# Patient Record
Sex: Female | Born: 1940 | Race: White | Hispanic: No | Marital: Married | Smoking: Former smoker
Health system: Southern US, Community
[De-identification: ages and names within clinical notes are randomized; demographics above are authoritative.]

## PROBLEM LIST (undated history)

## (undated) DIAGNOSIS — Z9889 Other specified postprocedural states: Secondary | ICD-10-CM

## (undated) DIAGNOSIS — F410 Panic disorder [episodic paroxysmal anxiety] without agoraphobia: Secondary | ICD-10-CM

## (undated) DIAGNOSIS — R112 Nausea with vomiting, unspecified: Secondary | ICD-10-CM

## (undated) DIAGNOSIS — I499 Cardiac arrhythmia, unspecified: Secondary | ICD-10-CM

## (undated) DIAGNOSIS — S62109A Fracture of unspecified carpal bone, unspecified wrist, initial encounter for closed fracture: Secondary | ICD-10-CM

## (undated) DIAGNOSIS — I482 Chronic atrial fibrillation, unspecified: Secondary | ICD-10-CM

## (undated) DIAGNOSIS — J449 Chronic obstructive pulmonary disease, unspecified: Secondary | ICD-10-CM

## (undated) DIAGNOSIS — Z8489 Family history of other specified conditions: Secondary | ICD-10-CM

## (undated) DIAGNOSIS — Z87442 Personal history of urinary calculi: Secondary | ICD-10-CM

## (undated) DIAGNOSIS — F32A Depression, unspecified: Secondary | ICD-10-CM

## (undated) DIAGNOSIS — K279 Peptic ulcer, site unspecified, unspecified as acute or chronic, without hemorrhage or perforation: Secondary | ICD-10-CM

## (undated) DIAGNOSIS — I959 Hypotension, unspecified: Secondary | ICD-10-CM

## (undated) DIAGNOSIS — Z7901 Long term (current) use of anticoagulants: Secondary | ICD-10-CM

## (undated) DIAGNOSIS — C801 Malignant (primary) neoplasm, unspecified: Secondary | ICD-10-CM

## (undated) DIAGNOSIS — F329 Major depressive disorder, single episode, unspecified: Secondary | ICD-10-CM

## (undated) DIAGNOSIS — Z720 Tobacco use: Secondary | ICD-10-CM

## (undated) DIAGNOSIS — M47817 Spondylosis without myelopathy or radiculopathy, lumbosacral region: Secondary | ICD-10-CM

## (undated) HISTORY — DX: Major depressive disorder, single episode, unspecified: F32.9

## (undated) HISTORY — PX: ABDOMINAL HYSTERECTOMY: SHX81

## (undated) HISTORY — PX: SPLENECTOMY, TOTAL: SHX788

## (undated) HISTORY — PX: COLONOSCOPY: SHX174

## (undated) HISTORY — DX: Peptic ulcer, site unspecified, unspecified as acute or chronic, without hemorrhage or perforation: K27.9

## (undated) HISTORY — DX: Depression, unspecified: F32.A

## (undated) HISTORY — PX: APPENDECTOMY: SHX54

## (undated) HISTORY — DX: Fracture of unspecified carpal bone, unspecified wrist, initial encounter for closed fracture: S62.109A

## (undated) HISTORY — DX: Tobacco use: Z72.0

## (undated) HISTORY — DX: Spondylosis without myelopathy or radiculopathy, lumbosacral region: M47.817

## (undated) HISTORY — DX: Long term (current) use of anticoagulants: Z79.01

## (undated) HISTORY — DX: Chronic atrial fibrillation, unspecified: I48.20

## (undated) HISTORY — PX: TONSILLECTOMY: SUR1361

---

## 1982-01-27 HISTORY — PX: GASTROJEJUNOSTOMY: SHX1697

## 2000-10-29 ENCOUNTER — Emergency Department (HOSPITAL_COMMUNITY): Admission: EM | Admit: 2000-10-29 | Discharge: 2000-10-29 | Payer: Self-pay | Admitting: *Deleted

## 2000-10-29 ENCOUNTER — Encounter: Payer: Self-pay | Admitting: *Deleted

## 2001-02-24 ENCOUNTER — Ambulatory Visit (HOSPITAL_COMMUNITY): Admission: RE | Admit: 2001-02-24 | Discharge: 2001-02-24 | Payer: Self-pay | Admitting: Pediatrics

## 2001-02-24 ENCOUNTER — Encounter: Payer: Self-pay | Admitting: General Surgery

## 2002-01-27 DIAGNOSIS — I482 Chronic atrial fibrillation, unspecified: Secondary | ICD-10-CM

## 2002-01-27 DIAGNOSIS — Z7901 Long term (current) use of anticoagulants: Secondary | ICD-10-CM

## 2002-01-27 HISTORY — DX: Long term (current) use of anticoagulants: Z79.01

## 2002-01-27 HISTORY — PX: CHOLECYSTECTOMY: SHX55

## 2002-01-27 HISTORY — DX: Chronic atrial fibrillation, unspecified: I48.20

## 2002-04-21 ENCOUNTER — Inpatient Hospital Stay (HOSPITAL_COMMUNITY): Admission: EM | Admit: 2002-04-21 | Discharge: 2002-04-23 | Payer: Self-pay | Admitting: Emergency Medicine

## 2002-04-21 ENCOUNTER — Encounter: Payer: Self-pay | Admitting: Emergency Medicine

## 2002-04-22 ENCOUNTER — Encounter: Payer: Self-pay | Admitting: General Surgery

## 2002-06-01 ENCOUNTER — Encounter: Payer: Self-pay | Admitting: *Deleted

## 2002-06-01 ENCOUNTER — Inpatient Hospital Stay (HOSPITAL_COMMUNITY): Admission: AD | Admit: 2002-06-01 | Discharge: 2002-06-13 | Payer: Self-pay | Admitting: *Deleted

## 2002-06-04 ENCOUNTER — Encounter: Payer: Self-pay | Admitting: *Deleted

## 2002-06-07 ENCOUNTER — Encounter: Payer: Self-pay | Admitting: *Deleted

## 2002-06-10 ENCOUNTER — Encounter (INDEPENDENT_AMBULATORY_CARE_PROVIDER_SITE_OTHER): Payer: Self-pay | Admitting: Cardiology

## 2002-07-10 ENCOUNTER — Encounter: Payer: Self-pay | Admitting: Emergency Medicine

## 2002-07-10 ENCOUNTER — Emergency Department (HOSPITAL_COMMUNITY): Admission: EM | Admit: 2002-07-10 | Discharge: 2002-07-10 | Payer: Self-pay | Admitting: Emergency Medicine

## 2002-07-22 ENCOUNTER — Ambulatory Visit (HOSPITAL_COMMUNITY): Admission: RE | Admit: 2002-07-22 | Discharge: 2002-07-22 | Payer: Self-pay | Admitting: *Deleted

## 2002-08-19 ENCOUNTER — Encounter: Payer: Self-pay | Admitting: Emergency Medicine

## 2002-08-19 ENCOUNTER — Inpatient Hospital Stay (HOSPITAL_COMMUNITY): Admission: EM | Admit: 2002-08-19 | Discharge: 2002-08-24 | Payer: Self-pay | Admitting: Emergency Medicine

## 2002-08-22 ENCOUNTER — Encounter: Payer: Self-pay | Admitting: Family Medicine

## 2002-08-23 ENCOUNTER — Encounter: Payer: Self-pay | Admitting: Family Medicine

## 2002-08-27 ENCOUNTER — Encounter (INDEPENDENT_AMBULATORY_CARE_PROVIDER_SITE_OTHER): Payer: Self-pay | Admitting: Internal Medicine

## 2002-08-27 ENCOUNTER — Ambulatory Visit (HOSPITAL_COMMUNITY): Admission: RE | Admit: 2002-08-27 | Discharge: 2002-08-27 | Payer: Self-pay | Admitting: Internal Medicine

## 2003-02-24 ENCOUNTER — Ambulatory Visit (HOSPITAL_COMMUNITY): Admission: RE | Admit: 2003-02-24 | Discharge: 2003-02-24 | Payer: Self-pay | Admitting: Family Medicine

## 2004-01-24 ENCOUNTER — Ambulatory Visit: Payer: Self-pay | Admitting: *Deleted

## 2004-01-24 ENCOUNTER — Ambulatory Visit (HOSPITAL_COMMUNITY): Admission: RE | Admit: 2004-01-24 | Discharge: 2004-01-24 | Payer: Self-pay | Admitting: *Deleted

## 2004-02-12 ENCOUNTER — Ambulatory Visit: Payer: Self-pay | Admitting: *Deleted

## 2005-10-03 ENCOUNTER — Emergency Department (HOSPITAL_COMMUNITY): Admission: EM | Admit: 2005-10-03 | Discharge: 2005-10-03 | Payer: Self-pay | Admitting: Emergency Medicine

## 2006-01-01 ENCOUNTER — Ambulatory Visit: Payer: Self-pay | Admitting: Orthopedic Surgery

## 2006-05-19 ENCOUNTER — Ambulatory Visit: Payer: Self-pay | Admitting: Orthopedic Surgery

## 2006-06-09 ENCOUNTER — Ambulatory Visit: Payer: Self-pay | Admitting: Orthopedic Surgery

## 2006-07-02 ENCOUNTER — Ambulatory Visit: Payer: Self-pay | Admitting: Orthopedic Surgery

## 2006-09-01 ENCOUNTER — Ambulatory Visit (HOSPITAL_COMMUNITY): Admission: RE | Admit: 2006-09-01 | Discharge: 2006-09-01 | Payer: Self-pay | Admitting: Internal Medicine

## 2006-09-02 ENCOUNTER — Ambulatory Visit: Payer: Self-pay | Admitting: Cardiology

## 2006-09-02 ENCOUNTER — Ambulatory Visit (HOSPITAL_COMMUNITY): Admission: RE | Admit: 2006-09-02 | Discharge: 2006-09-02 | Payer: Self-pay | Admitting: Internal Medicine

## 2006-09-15 ENCOUNTER — Ambulatory Visit: Payer: Self-pay | Admitting: Cardiovascular Disease

## 2007-07-21 ENCOUNTER — Ambulatory Visit: Payer: Self-pay | Admitting: Orthopedic Surgery

## 2007-07-21 ENCOUNTER — Emergency Department (HOSPITAL_COMMUNITY): Admission: EM | Admit: 2007-07-21 | Discharge: 2007-07-21 | Payer: Self-pay | Admitting: Emergency Medicine

## 2007-07-28 ENCOUNTER — Ambulatory Visit: Payer: Self-pay | Admitting: Orthopedic Surgery

## 2007-08-05 ENCOUNTER — Ambulatory Visit: Payer: Self-pay | Admitting: Orthopedic Surgery

## 2007-08-12 ENCOUNTER — Ambulatory Visit: Payer: Self-pay | Admitting: Orthopedic Surgery

## 2007-09-13 ENCOUNTER — Ambulatory Visit: Payer: Self-pay | Admitting: Orthopedic Surgery

## 2007-10-14 ENCOUNTER — Ambulatory Visit: Payer: Self-pay | Admitting: Orthopedic Surgery

## 2008-01-11 ENCOUNTER — Encounter: Payer: Self-pay | Admitting: Orthopedic Surgery

## 2008-02-03 ENCOUNTER — Ambulatory Visit: Payer: Self-pay | Admitting: Orthopedic Surgery

## 2008-02-03 DIAGNOSIS — M4800 Spinal stenosis, site unspecified: Secondary | ICD-10-CM

## 2008-05-09 ENCOUNTER — Ambulatory Visit (HOSPITAL_COMMUNITY): Admission: RE | Admit: 2008-05-09 | Discharge: 2008-05-09 | Payer: Self-pay | Admitting: Internal Medicine

## 2008-07-04 ENCOUNTER — Encounter (INDEPENDENT_AMBULATORY_CARE_PROVIDER_SITE_OTHER): Payer: Self-pay | Admitting: *Deleted

## 2008-07-13 ENCOUNTER — Ambulatory Visit: Payer: Self-pay | Admitting: Orthopedic Surgery

## 2008-07-18 ENCOUNTER — Encounter (INDEPENDENT_AMBULATORY_CARE_PROVIDER_SITE_OTHER): Payer: Self-pay | Admitting: *Deleted

## 2008-07-20 ENCOUNTER — Ambulatory Visit (HOSPITAL_COMMUNITY): Admission: RE | Admit: 2008-07-20 | Discharge: 2008-07-20 | Payer: Self-pay | Admitting: Orthopedic Surgery

## 2008-07-26 ENCOUNTER — Ambulatory Visit: Payer: Self-pay | Admitting: Orthopedic Surgery

## 2009-01-25 ENCOUNTER — Ambulatory Visit (HOSPITAL_COMMUNITY): Admission: RE | Admit: 2009-01-25 | Discharge: 2009-01-25 | Payer: Self-pay | Admitting: Internal Medicine

## 2009-07-24 ENCOUNTER — Encounter (INDEPENDENT_AMBULATORY_CARE_PROVIDER_SITE_OTHER): Payer: Self-pay | Admitting: *Deleted

## 2009-07-24 LAB — CONVERTED CEMR LAB
Bilirubin, Direct: 0.1 mg/dL
Cholesterol: 187 mg/dL
Creatinine, Ser: 0.64 mg/dL
Glomerular Filtration Rate, Af Am: 106 mL/min/{1.73_m2}
Glucose, Bld: 94 mg/dL
Potassium: 4.5 meq/L
Total Protein: 7 g/dL
Triglycerides: 162 mg/dL

## 2010-02-16 ENCOUNTER — Encounter: Payer: Self-pay | Admitting: Internal Medicine

## 2010-02-17 ENCOUNTER — Encounter: Payer: Self-pay | Admitting: Internal Medicine

## 2010-02-20 ENCOUNTER — Ambulatory Visit
Admission: RE | Admit: 2010-02-20 | Discharge: 2010-02-20 | Payer: Self-pay | Source: Home / Self Care | Attending: Orthopedic Surgery | Admitting: Orthopedic Surgery

## 2010-02-20 DIAGNOSIS — M1610 Unilateral primary osteoarthritis, unspecified hip: Secondary | ICD-10-CM | POA: Insufficient documentation

## 2010-02-28 NOTE — Assessment & Plan Note (Signed)
Summary: LEFT HIP PAINRAD DOWN LEG/NEEDS XR/AARP/BSF   Visit Type:  Follow-up  CC:  left leg pain .  History of Present Illness:   70 year old female with pain in her LEFT leg for the last year.  She describes aching pain in the groin and anterior thigh radiating down the medial aspect of her leg in the L4 distribution. She also complains of LEFT gluteal pain, radiating down the back of her leg.  She is on Vicodin, and that's not helping.    Allergies: 1)  ! Penicillin 2)  ! * Hormonr Therapy 3)  ! Benadryl 4)  ! Codeine  Past History:  Past Medical History: Last updated: 07/21/2007 BLOOD TOO THICK DEPRESSION HEART - A FIB  Past Surgical History: Last updated: 07/21/2007 PANCREAS GALLBLADDER-GANGREEN  Review of Systems      See HPI Constitutional:  Denies weight loss, weight gain, fever, and chills. Neurologic:  Complains of unsteady gait. Musculoskeletal:  See HPI.  Physical Exam  Additional Exam:  GEN: well developed, well nourished, normal grooming and hygiene, no deformity and small body habitus.   CDV: LOWER EXTREMITIES: pulses are normal, no edema, no erythema. no tenderness  Lymph: LOWER EXTREMS: normal lymph nodes   Skin: LOWER EXTREMS: no rashes, skin lesions or open sores   NEURO: LOWER EXTREMS: - SLR'S normal coordination, reflexes, sensation.   Psyche: awake, alert and oriented. Mood normal   Gait: FAIRLY NORMAL   Inspection: bilateral hip examination shows that she has decreased range of motion painful range of motion with internal rotation of her LEFT hip. She has only about 15 of internal rotation.  Her motor exam is normal. She has excellent stability of the hip.  She is a little bit scoliotic. She has tenderness in the lower lumbar segments, 5 and S1.  She has fairly good range of motion able to touch. Her toes. She has some pain with that.     Impression & Recommendations:  Problem # 1:  DISC DISEASE, LUMBOSACRAL SPINE  (ICD-722.52) Assessment Comment Only  Orders: Est. Patient Level IV (84166)  Separate and Identifiable X-Ray report       Lumbosacral Spine ,2/3 views (72100) she has some scoliosis noted, and some mild facet joint changes consistent with some degenerative disc disease. Although displaces them.  Problem # 2:  ARTHRITIS, LEFT HIP (ICD-716.95) Assessment: Comment Only  Orders: Est. Patient Level IV (06301) Pelvis x-ray complete, minimum 3 views (60109) Separate and Identifiable X-Ray report      The LEFT hip is arthritic, with bone-on-bone changes in the superior lateral acetabulum. No deformity is same.  Impression osteoarthritis, LEFT hip.  Patient does have hip replacement, but she is not interested in that at this time. As an alternative we told her to use a cane in her RIGHT hand.  Problem # 3:  SCIATICA (ICD-724.3) Assessment: Comment Only  The following medications were removed from the medication list:    Darvocet-n 100 100-650 Mg Tabs (Propoxyphene n-apap) .[name]... 1 by mouth q 4 as needed pain  Patient Instructions: 1)  Please schedule a follow-up appointment as needed. 2)  Please use a cane in the right hand  3)  You have degeneartive disc disease in the back and Arthritis in the hip    Orders Added: 1)  Est. Patient Level IV [32355] 2)  Pelvis x-ray complete, minimum 3 views [72190] 3)  Lumbosacral Spine ,2/3 views [72100]  Appended Document: LEFT HIP PAINRAD DOWN LEG/NEEDS XR/AARP/BSF    June of  2010 she had an MRI lumbar spine, which showed mild LEFT, and borderline, RIGHT subarticular lateral recessed stenosis, L4-L5 due to bulging disc and facet arthritis. There is also borderline central stenosis at the same level

## 2010-03-18 ENCOUNTER — Encounter (INDEPENDENT_AMBULATORY_CARE_PROVIDER_SITE_OTHER): Payer: Medicare Other

## 2010-03-18 ENCOUNTER — Encounter: Payer: Self-pay | Admitting: Cardiovascular Disease

## 2010-03-18 DIAGNOSIS — Z7901 Long term (current) use of anticoagulants: Secondary | ICD-10-CM

## 2010-03-18 DIAGNOSIS — I4891 Unspecified atrial fibrillation: Secondary | ICD-10-CM

## 2010-03-18 LAB — CONVERTED CEMR LAB: POC INR: 2

## 2010-03-26 ENCOUNTER — Encounter (INDEPENDENT_AMBULATORY_CARE_PROVIDER_SITE_OTHER): Payer: Self-pay | Admitting: *Deleted

## 2010-03-26 ENCOUNTER — Encounter: Payer: Self-pay | Admitting: Cardiology

## 2010-03-26 ENCOUNTER — Ambulatory Visit (INDEPENDENT_AMBULATORY_CARE_PROVIDER_SITE_OTHER): Payer: Medicare Other | Admitting: Cardiology

## 2010-03-26 DIAGNOSIS — I4891 Unspecified atrial fibrillation: Secondary | ICD-10-CM

## 2010-03-26 NOTE — Medication Information (Signed)
Summary: ***PROTIME/TG  Anticoagulant Therapy  Managed by: Vashti Hey, RN Supervising MD: Clifton James MD, Cristal Deer Indication 1: Atrial Fibrillation Lab Used: [facility] [place] Site: [place] INR POC 2.0  Dietary changes: no    Health status changes: no    Bleeding/hemorrhagic complications: no    Recent/future hospitalizations: no    Any changes in medication regimen? no    Recent/future dental: no  Any missed doses?: no       Is patient compliant with meds? yes      Comments: Referred from Dr Felecia Shelling for coumadin management  Allergies: 1)  ! Penicillin 2)  ! * Hormonr Therapy 3)  ! Benadryl 4)  ! Codeine  Anticoagulation Management History:      The patient is taking warfarin and comes in today for a routine follow up visit.  Positive risk factors for bleeding include an age of 70 years or older.  The bleeding index is 'intermediate risk'.  Negative CHADS2 values include Age > 70 years old.  Anticoagulation responsible provider: Clifton James MD, Cristal Deer.  INR POC: 2.0.  Cuvette Lot#: [id number].    Anticoagulation Management Assessment/Plan:      The patient's current anticoagulation dose is Coumadin 6 mg tabs: [name]  The next INR is due 04/03/2010.  Anticoagulation instructions were given to patient.  Results were reviewed/authorized by Vashti Hey, RN.  She was notified by Vashti Hey RN.        Coagulation management information includes: Refered from Dr Felecia Shelling  02/05/10 INR 3.6 Coumadin decrease to 5mg  alternating 4mg    Pt has 5mg  and 4mg  tablets.  Current Anticoagulation Instructions: INR 2.0 Take coumadin 5mg  once daily except 2.5mg  on Wednesdays

## 2010-03-27 ENCOUNTER — Encounter: Payer: Self-pay | Admitting: Cardiology

## 2010-03-27 LAB — CONVERTED CEMR LAB
ALT: 13 units/L (ref 0–35)
AST: 18 units/L (ref 0–37)
Albumin: 4.1 g/dL (ref 3.5–5.2)
Basophils Absolute: 0.1 10*3/uL (ref 0.0–0.1)
Basophils Relative: 1 % (ref 0–1)
CO2: 29 meq/L (ref 19–32)
Calcium: 9.1 mg/dL (ref 8.4–10.5)
Chloride: 104 meq/L (ref 96–112)
HCT: 41.4 % (ref 36.0–46.0)
Hemoglobin: 13.6 g/dL (ref 12.0–15.0)
INR: 1.95 — ABNORMAL HIGH (ref <1.50)
Lymphs Abs: 5.4 10*3/uL — ABNORMAL HIGH (ref 0.7–4.0)
MCV: 90.4 fL (ref 78.0–100.0)
Monocytes Relative: 8 % (ref 3–12)
Platelets: 329 10*3/uL (ref 150–400)
Potassium: 5.2 meq/L (ref 3.5–5.3)
Prothrombin Time: 22.4 s — ABNORMAL HIGH (ref 11.6–15.2)
RBC: 4.58 M/uL (ref 3.87–5.11)
Sodium: 143 meq/L (ref 135–145)
Total CHOL/HDL Ratio: 3.5
Total Protein: 6.8 g/dL (ref 6.0–8.3)
WBC: 12.9 10*3/uL — ABNORMAL HIGH (ref 4.0–10.5)

## 2010-03-28 ENCOUNTER — Encounter (INDEPENDENT_AMBULATORY_CARE_PROVIDER_SITE_OTHER): Payer: Medicare Other

## 2010-03-28 DIAGNOSIS — R195 Other fecal abnormalities: Secondary | ICD-10-CM

## 2010-04-02 ENCOUNTER — Encounter: Payer: Self-pay | Admitting: Cardiology

## 2010-04-03 ENCOUNTER — Encounter: Payer: Self-pay | Admitting: Cardiovascular Disease

## 2010-04-03 ENCOUNTER — Encounter (INDEPENDENT_AMBULATORY_CARE_PROVIDER_SITE_OTHER): Payer: Medicare Other

## 2010-04-03 DIAGNOSIS — I4891 Unspecified atrial fibrillation: Secondary | ICD-10-CM

## 2010-04-03 DIAGNOSIS — Z7901 Long term (current) use of anticoagulants: Secondary | ICD-10-CM

## 2010-04-03 LAB — CONVERTED CEMR LAB: POC INR: 2.3

## 2010-04-04 NOTE — Medication Information (Signed)
Summary: [facility]  Anticoagulant Therapy  Managed by: Vashti Hey, RN Supervising MD: Dietrich Pates MD, Molly Maduro Indication 1: Atrial Fibrillation Lab Used: [facility] [place] Site: [place] PT 22.4  Dietary changes: no    Health status changes: no    Bleeding/hemorrhagic complications: no    Recent/future hospitalizations: no    Any changes in medication regimen? no    Recent/future dental: no  Any missed doses?: no       Is patient compliant with meds? yes       Allergies: 1)  ! Penicillin 2)  ! * Hormonr Therapy 3)  ! Benadryl 4)  ! Codeine  Anticoagulation Management History:      Her anticoagulation is being managed by telephone today.  Positive risk factors for bleeding include an age of 70 years or older.  The bleeding index is 'intermediate risk'.  Negative CHADS2 values include Age > 47 years old.  Her last INR was 1.95 and today's INR is 1.95.  Prothrombin time is 22.4.  Anticoagulation responsible provider: Dietrich Pates MD, Molly Maduro.    Anticoagulation Management Assessment/Plan:      The patient's current anticoagulation dose is Coumadin 6 mg tabs: [name]  The target INR is 2.0-3.0.  The next INR is due 04/03/2010.  Anticoagulation instructions were given to patient.  Results were reviewed/authorized by Vashti Hey, RN.  She was notified by Vashti Hey RN.         Prior Anticoagulation Instructions: INR 2.0 Take coumadin 5mg  once daily except 2.5mg  on Wednesdays  Current Anticoagulation Instructions: INR 1.95 Pt states she has been taking coumadin to 5mg  once daily  Told pt to take 7.5mg  tonight then resume 5mg  once daily until INR check on 04/03/10

## 2010-04-04 NOTE — Miscellaneous (Signed)
Summary: labs lipid,bmp,a1c,heptic function,07/24/2009  Clinical Lists Changes  Observations: Added new observation of CALCIUM: 8.9 mg/dL (57/84/6962 9:52) Added new observation of ALBUMIN: 4.2 g/dL (84/13/2440 1:02) Added new observation of PROTEIN, TOT: 7.0 g/dL (72/53/6644 0:34) Added new observation of SGPT (ALT): 13 units/L (07/24/2009 8:08) Added new observation of SGOT (AST): 16 units/L (07/24/2009 8:08) Added new observation of ALK PHOS: 69 units/L (07/24/2009 8:08) Added new observation of BILI DIRECT: 0.1 mg/dL (74/25/9563 8:75) Added new observation of GFR AA: 106 mL/min/1.22m2 (07/24/2009 8:08) Added new observation of GFR: 92 mL/min (07/24/2009 8:08) Added new observation of CREATININE: 0.64 mg/dL (64/33/2951 8:84) Added new observation of BG RANDOM: 94 mg/dL (16/60/6301 6:01) Added new observation of CO2 PLSM/SER: 26 meq/L (07/24/2009 8:08) Added new observation of CL SERUM: 104 meq/L (07/24/2009 8:08) Added new observation of K SERUM: 4.5 meq/L (07/24/2009 8:08) Added new observation of NA: 139 meq/L (07/24/2009 8:08) Added new observation of LDL: 111 mg/dL (09/32/3557 3:22) Added new observation of HDL: 44 mg/dL (02/54/2706 2:37) Added new observation of TRIGLYC TOT: 162 mg/dL (62/83/1517 6:16) Added new observation of CHOLESTEROL: 187 mg/dL (07/37/1062 6:94) Added new observation of HGBA1C: 6.2 % (07/24/2009 8:08)

## 2010-04-09 NOTE — Medication Information (Signed)
Summary: [facility]  Anticoagulant Therapy  Managed by: Vashti Hey, RN Supervising MD: Eden Emms MD, Theron Arista Indication 1: Atrial Fibrillation Lab Used: [facility] [place] Site:  INR POC 2.3  Dietary changes: no    Health status changes: no    Bleeding/hemorrhagic complications: no    Recent/future hospitalizations: no    Any changes in medication regimen? no    Recent/future dental: no  Any missed doses?: no       Is patient compliant with meds? yes       Allergies: 1)  ! Penicillin 2)  ! * Hormonr Therapy 3)  ! Benadryl 4)  ! Codeine  Anticoagulation Management History:      The patient is taking warfarin and comes in today for a routine follow up visit.  Positive risk factors for bleeding include an age of 70 years or older.  The bleeding index is 'intermediate risk'.  Negative CHADS2 values include Age > 41 years old.  Her last INR was 1.95.  Anticoagulation responsible provider: Eden Emms MD, Theron Arista.  INR POC: 2.3.    Anticoagulation Management Assessment/Plan:      The patient's current anticoagulation dose is Coumadin 6 mg tabs: [name]  The target INR is 2.0-3.0.  The next INR is due 04/24/2010.  Anticoagulation instructions were given to patient.  Results were reviewed/authorized by Vashti Hey, RN.  She was notified by Vashti Hey RN.         Prior Anticoagulation Instructions: INR 1.95 Pt states she has been taking coumadin to 5mg  once daily  Told pt to take 7.5mg  tonight then resume 5mg  once daily until INR check on 04/03/10  Current Anticoagulation Instructions: INR 2.3 Continue coumadin 5mg  once daily

## 2010-04-16 NOTE — Assessment & Plan Note (Signed)
Summary: [facility]   Visit Type:  Initial Consult Primary Provider:  Dr. Avon Gully   History of Present Illness: [name] is seen at the kind request of Dr. Avon Gully for evaluation of atrial fibrillation and management of anticoagulation.  She is a very pleasant jocular woman previously seen by Dr. Domingo Sep of [facility], Dr. Dorethea Clan and Dr. Eden Emms.  During a 3+ year hiatus between evaluations by a cardiologist, she has done quite well.  She denies significant cardiopulmonary symptoms and has good exercise tolerance.  Anticoagulation has been managed by her primary care physician.  She has had no complications related to treatment with warfarin.  Carotid Doppler  Procedure date:  09/02/2006  Findings:       IMPRESSION:   Early bilateral bifurcation and proximal left ICA plaque without   hemodynamically significant stenosis.  Continued surveillance   recommended.   Deanne Coffer, D. Reuel Boom,  M.D.  CT Scan  Procedure date:  06/04/2002  Findings:      CT of Chest   IMPRESSION:  1.  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  ANOTHER MORE LINEAR NODULAR DENSITY IN THE RIGHT UPPER LOBE.    2.  NO ADENOPATHY.  3.  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  4.  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   Cyndie Chime, M.D.  EKG  Procedure date:  Apr 06, 2010  Findings:      Atrial fibrillation Controlled ventricular response with heart rate of 70 bpm Right ventricular conduction delay ST-T wave abnormality consistent with digoxin effect Comparison with prior tracing of 01/24/04: No significant change   Current Medications (verified): 1)  Neurontin 100 Mg Caps (Gabapentin) .... 2 At Bedtime 2)  Metoprolol Tartrate 25 Mg Tabs (Metoprolol Tartrate) .... Take 1/2 Tab Two Times A Day 3)   Lanoxin 0.125 Mg Tabs (Digoxin) .... Take 1 Tab Everyother Day 4)  Coumadin 5 Mg Tabs (Warfarin Sodium) .... Take 1 Tab Daily 5)  Alprazolam 0.5 Mg Tabs (Alprazolam) .... Take 1 Tab Three Times A Day 6)  Celexa 20 Mg Tabs (Citalopram Hydrobromide) .... Take 1 Tab Daily 7)  Diltiazem Hcl 120 Mg Tabs (Diltiazem Hcl) .... Take 1 Tab Daily 8)  Lanoxin 0.25 Mg Tabs (Digoxin) .... Take 1 Tab Every Otherday 9)  Tylenol Extra Strength 500 Mg Tabs (Acetaminophen) .... Take As Needed  Allergies: 1)  ! Penicillin 2)  ! * Estrogen 3)  ! Benadryl 4)  ! Codeine 5)  ! * Transdermal Nicotine  Comments:  Nurse/Medical Assistant: patient brought meds she uses [facility]  Past History:  Family History: Last updated: 04/06/2010 Father deceased at age 46 due to neoplastic disease Mother suffered a fatal myocardial infarction at age 18 Siblings, 3 sisters have died, 2 with carcinoma of the breast and one with cirrhosis; brother deceased as a result of myocardial infarction and a second as a result of gastric carcinoma;3 additional sisters and 3 brothers  Social History: Last updated: Apr 06, 2010 Employment-housecleaning Marital-married with one child and one adopted child; one stillbirth and one deceased child at age 17 due to illness Tobacco Use - 40 pack years; one half pack per day Alcohol Use - no Regular Exercise - no Drug Use - no  Past Medical History: Chronic atrial fibrillation-2004; near syncope and 2008->  Medication adjusted Anticoagulation Peptic ulcer  disease Tobacco abuse-40 pack years; quit attempt in 2012 Depression Degenerative joint disease-lumbosacral spine and hips  Past Surgical History: Cholecystectomy-2004 [place] II-1984 with splenectomy and incidental appendectomy Hysterectomy-no oophorectomy C-Section Tonsillectomy Colonoscopy-mid 1980s  Family History: Father deceased at age 23 due to neoplastic disease Mother suffered a fatal myocardial  infarction at age 88 Siblings, 3 sisters have died, 2 with carcinoma of the breast and one with cirrhosis; brother deceased as a result of myocardial infarction and a second as a result of gastric carcinoma;3 additional sisters and 3 brothers  Social History: Employment-housecleaning Marital-married with one child and one adopted child; one stillbirth and one deceased child at age 75 due to illness Tobacco Use - 40 pack years; one half pack per day Alcohol Use - no Regular Exercise - no Drug Use - no  Review of Systems       Requires corrective lenses; upper and lower dentures; history of gastric ulcer in the 1980s; arthritic discomfort in the hips.  All other systems reviewed and are negative.  Vital Signs:  Patient profile:   70 year old female Height:      60 inches Weight:      118 pounds BMI:     23.13 Pulse rate:   81 / minute BP sitting:   131 / 82  (left arm)  Vitals Entered By: Dreama Saa, CNA (March 26, 2010 1:49 PM)  Physical Exam  General:  Proportionate height and weight; well developed; no acute distress: HEENT-hoarse voice   Neck-No JVD; no carotid bruits: Lungs-No tachypnea, no rales; no rhonchi; no wheezes: Cardiovascular-normal PMI; normal S1 and S2; irregular rhythm Abdomen-BS normal; soft and non-tender without masses or organomegaly:  Musculoskeletal-No deformities, no cyanosis or clubbing: Neurologic-Normal cranial nerves; symmetric strength and tone:  Skin-Warm, no significant lesions: Extremities-Nl distal pulses; no edema:     Impression & Recommendations:  Problem # 1:  ATRIAL FIBRILLATION (ICD-427.31) Long-standing arrhythmia without symptoms or apparent adverse clinical effects.  Heart rate control is good with current medication, which will be continued.  Problem # 2:  ANTICOAGULATION (ICD-V58.61) Patient has done well with warfarin treatment.  Cardiovascular risk factors have been marginal to warrant full anticoagulation, but now,as  a woman approaching age 59, risk for thromboembolism is substantial.  Full anticoagulation will be maintained.  Problem # 3:  TOBACCO ABUSE (ICD-305.1) Patient has substantially decreased tobacco use after being provided with a new car for doing so.  Unfortunately, she continues to smoke occasionally, but not every day.  Complete abstinence is advised.  Other Orders: Hemoccult Cards (Take Home) (Hemoccult Cards) T-Lipid Profile (09811-91478) T-Comprehensive Metabolic Panel [phone]) T-CBC w/Diff ([id number]) T-Protime, Auto ([id number])  Patient Instructions: 1)  Your physician recommends that you schedule a follow-up appointment in: 1 year 2)  Your physician recommends that you return for lab work in: today 3)  Your physician discussed the hazards of tobacco use.  Tobacco use cessation is recommended and techniques and options to help you quit were discussed.

## 2010-04-23 ENCOUNTER — Encounter: Payer: Self-pay | Admitting: Cardiology

## 2010-04-23 DIAGNOSIS — Z7901 Long term (current) use of anticoagulants: Secondary | ICD-10-CM

## 2010-04-23 DIAGNOSIS — I4891 Unspecified atrial fibrillation: Secondary | ICD-10-CM

## 2010-04-24 ENCOUNTER — Ambulatory Visit (INDEPENDENT_AMBULATORY_CARE_PROVIDER_SITE_OTHER): Payer: Medicare Other | Admitting: *Deleted

## 2010-04-24 DIAGNOSIS — I4891 Unspecified atrial fibrillation: Secondary | ICD-10-CM

## 2010-04-24 DIAGNOSIS — Z7901 Long term (current) use of anticoagulants: Secondary | ICD-10-CM

## 2010-04-24 LAB — POCT INR: INR: 2.5

## 2010-05-22 ENCOUNTER — Ambulatory Visit (INDEPENDENT_AMBULATORY_CARE_PROVIDER_SITE_OTHER): Payer: Medicare Other | Admitting: *Deleted

## 2010-05-22 DIAGNOSIS — I4891 Unspecified atrial fibrillation: Secondary | ICD-10-CM

## 2010-05-22 DIAGNOSIS — Z7901 Long term (current) use of anticoagulants: Secondary | ICD-10-CM

## 2010-05-22 LAB — POCT INR: INR: 3.7

## 2010-05-27 ENCOUNTER — Telehealth: Payer: Self-pay | Admitting: Cardiology

## 2010-05-27 ENCOUNTER — Other Ambulatory Visit: Payer: Self-pay | Admitting: *Deleted

## 2010-05-27 MED ORDER — WARFARIN SODIUM 5 MG PO TABS: ORAL_TABLET | ORAL | Status: DC

## 2010-05-27 NOTE — Telephone Encounter (Signed)
Pt needs a refill called in for coumadin to [facility]

## 2010-05-28 ENCOUNTER — Other Ambulatory Visit: Payer: Self-pay | Admitting: Cardiology

## 2010-05-28 MED ORDER — WARFARIN SODIUM 5 MG PO TABS: ORAL_TABLET | ORAL | Status: DC

## 2010-05-28 NOTE — Telephone Encounter (Signed)
Pt needs rx called in for coumadin she took last pill today and was told by Dr Felecia Shelling office they were not going to fill rx any longer.  Pt gets rx from [facility]

## 2010-06-10 ENCOUNTER — Ambulatory Visit (INDEPENDENT_AMBULATORY_CARE_PROVIDER_SITE_OTHER): Payer: Medicare Other | Admitting: *Deleted

## 2010-06-10 DIAGNOSIS — Z7901 Long term (current) use of anticoagulants: Secondary | ICD-10-CM

## 2010-06-10 DIAGNOSIS — I4891 Unspecified atrial fibrillation: Secondary | ICD-10-CM

## 2010-06-10 LAB — POCT INR: INR: 2.3

## 2010-06-11 NOTE — Procedures (Signed)
NAMEHURLEY, BLEVINS NO.:  [id number]   MEDICAL RECORD NO.:  [id number]          PATIENT TYPE:  OUT   LOCATION:  RAD                           FACILITY:  [facility]   PHYSICIAN:  Madolyn Frieze. Jens Som, MD, FACCDATE OF BIRTH:  27-Apr-1940   DATE OF PROCEDURE:  DATE OF DISCHARGE:                                ECHOCARDIOGRAM   HISTORY OF PRESENT ILLNESS:  The patient is a 70 year old female who  apparently had a syncopal episode.  Study is performed to quantify left  ventricular function.  It is technically adequate.  The two-dimensional  measurements are as follows:  The left ventricle end-diastolic dimension  is 3.9 cm and the left ventricle end-systolic dimension is 3.2 cm.  The  aorta is 3.1 cm.  Left atrium is 3.6 cm.  The septum and poster wall are  1.1 cm, respectively.   The overall left ventricular function is normal with an estimated  ejection fraction of 55%.  Note, the ejection fraction was somewhat  difficult to quantitate as the patient was in atrial fibrillation at the  time.  The heart rate was in the 100 range.  There appear to be mild  left atrial enlargement as well as mild right atrial enlargement.  The  right ventricle was normal in size and function.  There was no  pericardial effusion.   The aortic valve was trileaflet and there was no aortic stenosis or  aortic insufficiency.  The mitral valve was normal and there was trace  mitral regurgitation.  There was trace tricuspid regurgitation.  There  was no pulmonic insufficiency.   NOTE:  There was also lipomatous hypertrophy of the atrial septum.   FINAL INTERPRETATION:  1. Normal left ventricular function - Note, the patient was in atrial      fibrillation during this study with a heart rate of approximately      100 and ejection fraction was somewhat difficult to quantitate.  2. Mild biatrial enlargement.  3. Lipomatous hypertrophy of the atrial septum.  4. Trace mitral and tricuspid  regurgitation.      Madolyn Frieze Jens Som, MD, [facility]  Electronically Signed     BSC/MEDQ  D:  09/02/2006  T:  09/02/2006  Job:  [id number]   cc:   [name] D. Felecia Shelling, MD  Fax: [phone]

## 2010-06-11 NOTE — Assessment & Plan Note (Signed)
[facility]                       [facility] OFFICE NOTE   Marissa, Turner                       MRN:          [id number]  DATE:09/15/2006                            DOB:          May 10, 1940    [name] is a pleasant 70 year old patient referred by Dr.  Felecia Shelling for  syncope and atrial fibrillation.   The patient has been previously been seen by Dr.  Anthonette Legato back in 2006.  She has not had close followup since that time. Her atrial fibrillation  goes back as far as 2004 when she had a cholecystectomy for a gangrenous  cholecystitis. Apparently at that time, she was seen by [facility] and  she had clot in her left atrium by TEE and a cardioversion was not  recommended. She has been rate controlled with anticoagulation since  then. She has not had any significant TIAs or CVAs. She takes 5 mg of  Coumadin a day and she says her INRs have been perfect.   She had been having increasing fatigue and dizziness over the last two  months. She describes some postural symptoms. She did not have  particular palpitations. There was no frank syncope. The patient had her  Cardizem and metoprolol cut in half recently and she feels 100%  improved. She relates her symptoms of lightheadedness, pre-syncope and  weakness to being over medicated. The fact that she has marked  improvement with half the dose of metoprolol and Cardizem would lend  credence to this.   There is no documented history of coronary artery disease. Her risk  factors include hypertension and smoking.   She has not had a recent stress test or echocardiogram.   The patient had a recent carotid duplex done August 6th because of her  weakness and her syncope. It was normal without evidence of high grade  stenosis. Her symptoms have not recurred since her medications were  adjusted down.   In regards to her atrial fibrillation, there has been no documented  history of excessively slow  rates and no indication for pacemakers. She  in general is in chronic atrial fibrillation and is not going in and  out.   REVIEW OF SYSTEMS:  Are remarkable for an occasional cough. She  continues to smoke. She says as little as 3-4 cigarettes a day.   PAST MEDICAL HISTORY:  1. Atrial fibrillation.  2. 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  3. Peptic ulcer disease with Bilroth II.  4. Previous cholecystectomy.  5. She has also had a previous C-section.   ALLERGIES:  PENICILLIN AND FEMALE HORMONES WHICH MAKE HER SWELL.   She is a greater than 50 pack year smoker.   FAMILY HISTORY:  Is noncontributory.   The patient cleans houses for a living. She has one child and one  adopted child in a sense that was with her today. She is happily  married. She likes to fish and in general is fairly active.   She does not drink excessively.   CURRENT MEDICATIONS:  1. Lopressor 12.5 b.i.d.  2. Cardizem 160 once a day.  3. Restoril  30 at night.  4. Dig 0.125 a day.  5. Lexapro 20 a day.   PHYSICAL EXAMINATION:  Is remarkable for a chronically ill appearing  middle aged female in no distress. She looks a little older than her  stated age. Affect is appropriate. Weight is 122. Blood pressure is  120/62, pulse is 72 and irregular. Respiratory rate is 14. She is  afebrile.  NECK: Supple. There are no bruits.  There is no thyromegaly. No  lymphadenopathy. No JVP elevation.  HEENT: Is unremarkable.  LUNGS:  Are clear with good diaphragmatic motion. No wheezing.  There is an S1, S2 with normal heart sounds. PMI is normal.  ABDOMEN: Is protuberant. She has multiple scars from her hysterectomy  and gallbladder as well as colectomy surgery. Bowel sounds are positive  and no AAA. No tenderness. No hepatosplenomegaly. No hepatojugular  reflux.  Femorals are +3 bilaterally without bruits. Distal pulses are intact. No  edema.  NEURO: Is nonfocal. There is no muscular  weakness.   EKG shows atrial fibrillation with nonspecific ST-T wave changes and dig  effect.   IMPRESSION:  1. Chronic atrial fibrillation. No evidence of long pauses. Doubt it      is related to her dizziness or syncope. Continue anticoagulation.  2. Pre-syncope related to probable over medication, marked improvement      in symptoms with lower dose Cardizem and metoprolol. I suspect that      she could be controlled with just one of these medications if Dr.      Felecia Shelling wanted to simplify her regimen, I would give her 240 of      Cardizem only or Toprol 25-50 mg only. I will leave this up to him      to simplify her regimen as opposed to being on two drugs.  3. Hypertension. Currently well-controlled. Continue low dose beta-      blocker or calcium channel blocker and low salt diet.  4. Anticoagulation. Anticoagulation being followed at Dr.  Letitia Neri      office. No bleeding diaphysis. Continue current schedule.  5. Smoking with chronic obstructive pulmonary disease. The patient is      really not motivated to quit. She will probably need followup PFTs      in the future. She would be a candidate for Wellbutrin and Chantix      should she decide to do this in the future.  6. History of colitis with Bilroth II. May need further GI followup in      the future. No evidence of bleeding in her stools. Followup routine      hematocrit and hemoglobin on a yearly basis.   In regards to the patient's heart, I think given her chronic atrial  fibrillation and history of pre-syncope, we should do a 2D  echocardiogram. It would be nice to document normal RV and LV function  and to further assess her atrial sizes which are probably moderately  dilated. As long as her echo has no surprises, I will see her back in a  year.     [name]. Eden Emms, MD, [facility]  Electronically Signed    PCN/MedQ  DD: 09/15/2006  DT: 09/15/2006  Job #: [id number]

## 2010-06-14 NOTE — Cardiovascular Report (Signed)
   NAME:  Marissa Turner, Marissa Turner                          ACCOUNT NO.:  [id number]   MEDICAL RECORD NO.:  [id number]                   PATIENT TYPE:  AMB   LOCATION:  ENDO                                 FACILITY:   PHYSICIAN:  Kem Boroughs, M.D.                 DATE OF BIRTH:  1940-12-05   DATE OF PROCEDURE:  07/22/2002  DATE OF DISCHARGE:                              CARDIAC CATHETERIZATION   PROCEDURE:  Transesophageal echocardiogram with possible cardioversion.   INDICATION:  Atrial fibrillation.   PERFORMING PHYSICIAN:  Kem Boroughs, M.D.   COMPLICATIONS:  None immediate.   The patient was sedated with 12 mg of IV Versed and 100 mcg of IV fentanyl.  (As you can see, she required a significant amount of medication for  adequate sedation). The patient was intubated without difficulty.   RESULTS:  1. Left atrial appendage with continued possible clot as previously.     Decreased exit velocities are noted and are approximately 20 cm per     second.  She does have significant smoke in her atrial appendage.  2. The left atrium is mildly dilated and also has significant smoke.  No     clot is noted.  3. Right atrium and right ventricle are normal in size and function.  4. Mitral valve is normal in structure and function with mild mitral     regurgitation.  5. Aortic valve is thin, tall leaflet, and pliable.  6. Pulmonic valve was not well visualized.  7. Tricuspid valve is normal in structure and function.  8. The left ventricle is normal in size with normal left ventricular     systolic function.  9. The aorta is without significant atheroma.    CONCLUSION:  Will not pursue elective cardioversion at present given the  above findings and possible left atrial appendage thrombus, positive smoke  and decreased exit velocities.  Will continue Coumadin along with other  previous cardiac meds.  Followup at [facility]  in [place] in approximately one to two  weeks.                                                Kem Boroughs, M.D.    TB/MEDQ  D:  07/22/2002  T:  07/23/2002  Job:  [id number]  Mila Homer. Sudie Bailey, M.D.  [address] [place], [place] [address]  Fax: [phone]   cc:   Mila Homer. Sudie Bailey, M.D.  [address] [place], [place] [address]  Fax: [phone]

## 2010-06-14 NOTE — Group Therapy Note (Signed)
   NAME:  Marissa Turner, Marissa Turner                          ACCOUNT NO.:  [id number]   MEDICAL RECORD NO.:  [id number]                   PATIENT TYPE:  INP   LOCATION:  A323                                 FACILITY:  [facility]   PHYSICIAN:  Mila Homer. Sudie Bailey, M.D.           DATE OF BIRTH:  1941/01/26   DATE OF PROCEDURE:  08/23/2002  DATE OF DISCHARGE:                                   PROGRESS NOTE   SUBJECTIVE:  She has been pain-free, feeling a lot better.  The patient is  feeling so strong she was able to walk downstairs, out of the hospital, and  smoke a cigarette outside last night.  She has continued on her IVs, of  course.   OBJECTIVE:  She is supine in bed this morning, no acute distress, well-  developed, well-nourished, oriented and alert.  Temperature 97.9, pulse 68,  respiratory rate 22, blood pressure 100/64.  Lungs are clear throughout  moving air well.  No intercostal retraction, no use of accessory muscles of  respiration.  The heart has an irregular rhythm, rate of about 70, no  murmurs.  The abdomen today is soft without hepatosplenomegaly or mass and  no tenderness.  There is no edema of the ankles.   ASSESSMENT:  1. Pancreatitis secondary to common bile duct stone.  2. Hepatitis secondary to common bile duct stone.  3. Status post cholecystectomy at [facility] two months ago.  4. Chronic atrial fibrillation.  5. Anticoagulation.   PLAN:  She is due for EGD and ERCP by Dr. Karilyn Cota this morning.  INR is now  down to 1.3.  Immediately after the procedure she will be started on  Lovenox, then back on Coumadin tonight.  Hopefully will be able to be  discharged home tomorrow if all goes well.  Continue on her numerous  medications include pantoprazole 40 mg p.o. daily, metoprolol 25 mg b.i.d.,  digoxin 0.125 mg daily, diltiazem 300 mg daily, KCl 20 mEq b.i.d., and  sertraline 150 mg daily for depression.                                               Mila Homer. Sudie Bailey,  M.D.    SDK/MEDQ  D:  08/23/2002  T:  08/23/2002  Job:  [id number]

## 2010-06-14 NOTE — Group Therapy Note (Signed)
   NAME:  Marissa Turner, Marissa Turner                          ACCOUNT NO.:  [id number]   MEDICAL RECORD NO.:  [id number]                   PATIENT TYPE:  INP   LOCATION:  IC03                                 FACILITY:  [facility]   PHYSICIAN:  Mila Homer. Sudie Bailey, M.D.           DATE OF BIRTH:  [birth year]   DATE OF PROCEDURE:  DATE OF DISCHARGE:                                   PROGRESS NOTE   ADDENDUM:  I have reviewed her pancreatic enzymes today and found the lipase  is 2,448 and the amylase 1482.   The patient, herself, had just had lunch by the time we got these back and  she tolerated this well.  She was having no further abdominal pain.  Given  this, we have revised the diagnosis to probable pancreatitis secondary to a  common bile duct stone.  LFTs are pending.  I have discussed the case at  length with Dr. Karilyn Cota, gastroenterologist, and we are getting an abdominal  ultrasound to look at her common bile duct and also putting her on Protonix  40 mg IV q.12h. and stopping the nitroglycerin drip.  I will switch her IV  from normal saline to D-5 1/2 normal saline with 10 mEq KCl per liter.   Discussed all this at length with the patient and family.   PLAN:  Will be to cool off the pancreatitis and then have EGD with ERCP by  Dr. Karilyn Cota.  She understands.   I spent half an hour in the coordination of care with this, going over the  last tests, talking to consultant over the phone while I was in the  hospital, spoke to the family at length.                                               Mila Homer. Sudie Bailey, M.D.    SDK/MEDQ  D:  08/20/2002  T:  08/20/2002  Job:  [id number]

## 2010-06-14 NOTE — Group Therapy Note (Signed)
NAME:  Marissa Turner, Marissa Turner                          ACCOUNT NO.:  [id number]   MEDICAL RECORD NO.:  [id number]                   PATIENT TYPE:  INP   LOCATION:  IC03                                 FACILITY:  [facility]   PHYSICIAN:  Mila Homer. Sudie Bailey, M.D.           DATE OF BIRTH:  Apr 21, 1940   DATE OF PROCEDURE:  08/20/2002  DATE OF DISCHARGE:                                   PROGRESS NOTE   SUBJECTIVE:  The patient appeared to be well last night in the ICU, but this  morning developed more midabdominal pain.  This is exactly the type of pain  that she had been having yesterday.   Two weeks ago she had a meal which she had eaten out with her husband and  then developed fairly severe periumbilical pain radiating through to the  back.  This cleared within 1-2 hours spontaneously.  The pain that she had  yesterday morning was about 10 a.m. not long after having some pinto beans  and at no time did she have any pain in the chest.   OBJECTIVE:  Vital signs this morning showed temperature of 98.4, pulse 96,  respiratory rate 20, blood pressure 120/75.  She is supine in bed in no  acute distress, well-developed and well-nourished, oriented and alert.  Her  husband was in attendance.  Heart has an irregular irregularity with a rate  of about 70.  There are no murmurs.  Lungs appear clear throughout.  Moving  air well.  There are no intercostal retractions.  Abdomen is soft, no  hepatosplenomegaly or masses; but there is tenderness on palpation of the  umbilicus.  There is no edema of the ankles.  Skin turgor is normal.  Mucosa  membranes are moist.   Blood work today on heparin showed an INR of 9.0, PTT 66.  Her troponin was  0.12 on admission yesterday afternoon and dropped to 0.05 last night and it  was 0.13 this morning.  CK was 49, MB 1.1.   ASSESSMENT:  1. Abdominal pain question etiology.  2. Chronic atrial fibrillation.  3. Anticoagulation.  4. Generalized anxiety disorder.  5.  Status post cholecystectomy.  6. Status post Billroth II.  7. 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   PLAN:  Add amylose and Pancrease to her enzymes.  Repeat EKG.  We will  discuss with cardiology and possibly GI.  Reviewed her medicines which are  right now: Digoxin 0.125 mg daily, diltiazem 300 mg daily, metoprolol 25 mg  b.i.d. and Circulin 150 mg daily.  She is also on p.r.n. alprazolam 1 mg  q.4h. with acetaminophen 650 mg q.4h. and zolpidem 10 mg q.h.s. p.r.n.  nitroglycerin.   This ICU visit entailed 25 minutes of time including review of her  electronic medical record, the paper medical record, discussion with nurses,  discussion with the patient and her husband, examination of the patient, and  orders.                                               Mila Homer. Sudie Bailey, M.D.    SDK/MEDQ  D:  08/20/2002  T:  08/20/2002  Job:  [id number]

## 2010-06-14 NOTE — Discharge Summary (Signed)
NAME:  Marissa Turner, Marissa Turner                          ACCOUNT NO.:  [id number]   MEDICAL RECORD NO.:  [id number]                   PATIENT TYPE:  INP   LOCATION:  A323                                 FACILITY:  [facility]   PHYSICIAN:  Mila Homer. Sudie Bailey, M.D.           DATE OF BIRTH:  [birth year]   DATE OF ADMISSION:  08/19/2002  DATE OF DISCHARGE:  08/24/2002                                 DISCHARGE SUMMARY   A 70 year old was admitted to the hospital with what was felt to be chest  pain possible coronary artery disease.  She had a benign six day  hospitalization extending from August 19, 2002  - August 24, 2002.   The patient initially had a series of blood tests including a CBC which  showed a 14,900 white cell count, a normal MET-7, normal CK of 98, MB of 1.5  but troponin was 0.12.  Her PT on admission was 39.2, INR was 7.5, PTT 76.  Repeat CK, eight hours later, was 60, MB 1.3 and troponin of 0.05.  The  third set of enzymes showed the troponin had risen to 0.13 again.  On the  second day the INR went up to 9.0 and she was then treated with vitamin K.  Her amylase, noted on her second day, was 1482.  Lipase was 2448.  Her alk  phos was 277, SGOT of 1124 and SGPT of 544 and direct bilirubin 0.6.  Following day, the alk phos was 248, SGOT 312, SGPT 348.  Her amylase  dropped to 580 and the lipase to 294, then dropped to 143 with a lipase of  85 and LFTs likewise, improved.   Patient had a 12-lead EKG which showed atrial fibrillation.   Portable chest x-ray showed borderline cardiac enlargement.   Her abdominal ultrasound, on her fourth day, showed she is status post  splenectomy and cholecystectomy.  Kidneys were the correct size.  Common  bile duct measured 5.4-mm.   Initially admitted to the hospital due to her elevated troponin and the  thought that she is having chest pain.  A nitroglycerin drip was arranged as  was a heparin drip at least until her INR of 7 came back from the lab.   She  was put on a 2-gram sodium diet with p.r.n. [Tylenol] 10 mg q.h.s. and  Tylenol 650 q.4h. and Xanax 1 mg q.4h.  Later that day, she started back on  her regular meds including Zoloft 150 mg daily, Lanoxin 1.25 mg daily,  metoprolol 25 mg b.i.d., Cardizem 300 mg daily, Vicodin q.4h. for severe  pain.  At this time we checked amylase and pancreas in addition to her other  labs.  She noted that she had been having not epigastric pain but just mid  periumbilical pain intermittently for several months.  When this was  discovered her nitroglycerin was stopped.  LFTs were likewise checked and  she was put  on Protonix 40 mg IV q.12h.  IV was changed to D-5-1/2 normal  saline with 10 mEq of KCl per liter at 75 cc/hr.   I discussed the case at length with Dr. Lionel December, gastroenterologist.   She was having nicotine withdrawal and did not like how she felt on the  patch in the past.  We tried her on nicotine gum but she could not tolerate  it with dentures.   By the third day she was transferred to the floor on IV D-5-1/2 normal  saline with 10 mEq KCl per liter still at 75 cc/hr.  I also continued her  potassium, Zoloft, Cardizem, Lanoxin, metoprolol.  Her p.r.n. medicines were  continued but her Protonix was discontinued.  Her IV Protonix was  discontinued.  She was put on Protonix 40 mg p.o. daily.  Cardiac monitor  was discontinued.  She was put on clear liquids.   On her fifth day, she was put on a low-fat diet and arrangements were made  for the sixth day to have an EGD and an ERCP.  Unfortunately, a Billroth II  had such a long jejunal and duodenal loop that the scope did not reach the  ampulla of Vater, so, the ERCP could not be performed.  At this time  however, the patient was asymptomatic.  She had been pain free for some  days.  She was feeling much better.  So, once she was back to the floor she  was allowed to ambulate and actually ate a diet with some fat without any   problems.  She was, therefore, felt to be stable for discharge home later  that evening.   FINAL DISCHARGE DIAGNOSES:  1. Recurrent abdominal pain, probably secondary to pancreatitis from a     common bile duct stone, which may have passed.  2. Chronic atrial fibrillation.  3. Anticoagulation.  4. Tobacco use disorder.  5. Depression.  6. Status post cholecystectomy two months ago at [facility].  7. Status post Billroth II surgery.  8. 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   PLAN:  1. Continue all the meds she has been on before coming to the hospital     including Zoloft, Lanoxin, Lopressor, Cardizem and Coumadin five 2 mg     tablets a night and three 2 mg tablets tomorrow night.  2. Her PT, INR will be checked in the office in two days.  3. Arrangements have been made for her to have a MR of her common bile duct     at [facility] in three days.  Meanwhile she is to stay on a     regular diet.                                               Mila Homer. Sudie Bailey, M.D.    SDK/MEDQ  D:  08/24/2002  T:  08/25/2002  Job:  [id number]   cc:   Lionel December, M.D.  [address]  [place]  [place] [address]  Fax: [phone]

## 2010-06-14 NOTE — Discharge Summary (Signed)
NAME:  Marissa Turner, Marissa Turner                          ACCOUNT NO.:  [id number]   MEDICAL RECORD NO.:  [id number]                   PATIENT TYPE:  INP   LOCATION:  IC03                                 FACILITY:  [facility]   PHYSICIAN:  Mila Homer. Sudie Bailey, M.D.           DATE OF BIRTH:  1940-10-01   DATE OF ADMISSION:  04/21/2002  DATE OF DISCHARGE:  04/23/2002                                 DISCHARGE SUMMARY   INTRODUCTION:  This 70 year old woman was admitted to the hospital March 25,  transferred April 23, 2002, for a three-day hospital course.  She came in  with signs and symptoms of acute cholecystitis and was seen by Dr. Ferman Hamming, a general surgeon.  Later her second day of hospitalization she  developed atrial fibrillation, rapid ventricular response, was initially  transferred to the intensive care unit, but when this was uncontrollable it  was elected to transfer her to a tertiary-care hospital.  [facility] did not have any beds, so we transferred her to [facility].   LABORATORY DATA:  Blood work showed an admission white cell count of 16,700,  of which 67% were neutrophils, 24 lymphs.  Recheck of white cell count the  next day was 17,000, with about the same shift.  Her H&H was 12/36.1,  recheck 11.6/35.  Her PT was 4.1 and INR 0.8.  MET-7 shows sodium 132 which  corrected to 139, glucose 123 and stayed at 123 , BUN 9 changed to 4 after  IV fluids of one day.  Her initial routine chemistries were fine, but the  following day she had an AST of 454, ALT of 396, ALP 131.  Her CK was 136  and MB of 7.1, and recheck 96 with MB of 4.6.  Her troponin went to 0.13  eventually.  The following day this was 0.02.  However, another cardiac  enzymes were negative.   She had an acute abdominal series which showed numerous clips scattered  through the abdomen consistent with prior surgeries.  Degenerative  arthropathy of the left hip.  A density suspicious for  pulmonary nodule in  the left mid lung.  Her ultrasound of the abdomen showed dilated gallbladder  with gallstones in the gallbladder neck and diffuse gallbladder wall  thickening.  Cholecystic edema.  She had mild echogenic __________ liver  which was thought to be either secondary to cirrhosis or fatty infiltration  or hepatitis.  She is status post splenectomy.   EKG on admission showed normal sinus rhythm, rate of 90.  Two days later she  showed atrial fibrillation, rate of 147 on her EKG.   HOSPITAL COURSE:  She was admitted through the ER.  She had an IV of normal  saline, initially KVO.  She was given Pepcid 4 mg IV and morphine sulfate 4  mg IV.  Dr. Juanetta Gosling and I admitted her with diagnosis of  right upper  quadrant pain, give her O2 at 2 L, IV normal saline at 125 cc/hr, give her  morphine 4 mg q.2h. p.r.n. severe pain.  Tylenol 650 q.4h.  Put her on  cefotetan 1 g IV q.8h. and Pepcid 40 IV q.12h.   She was given Lovenox 1 mg/kg q.12h., Cardizem bolus 10 mg followed by 10  mg/hr, and finally was transferred to Dr. Tresa Endo at [facility].  There she will be under Dr. Graylon Good service, transferred by  ambulance.   Arrangements were made for her to have surgery on April 23, 2002, but then  she developed a rapid heart rate and this turned to atrial fibrillation as  above.  She was transferred to [facility] where she did have an  uneventful cholecystectomy, and atrial fibrillation cleared by the time she  was seen back in the office several weeks ago, and she has really done very  well with all of this.   DISCHARGE DIAGNOSES:  1. Acute cholecystitis.  2. Atrial fibrillation.  3. Chronic obstructive pulmonary disease.  4. Abnormal chest x-ray.  5. Liver function tests probably secondary to a common duct stone.   PLAN:  Will plan for her to have chest x-ray and a CT scan of the lung in  the recuperative period.                                                Mila Homer. Sudie Bailey, M.D.    SDK/MEDQ  D:  05/28/2002  T:  05/28/2002  Job:  [id number]

## 2010-06-14 NOTE — Consult Note (Signed)
NAME:  Marissa Turner, Marissa Turner                          ACCOUNT NO.:  [id number]   MEDICAL RECORD NO.:  [id number]                   PATIENT TYPE:  INP   LOCATION:  A225                                 FACILITY:  [facility]   PHYSICIAN:  Barbaraann Barthel, M.D.              DATE OF BIRTH:  1940-08-06   DATE OF CONSULTATION:  04/22/2002  DATE OF DISCHARGE:                                   CONSULTATION   REASON FOR CONSULTATION:  Surgery was asked to see this 70 year old white  female with abdominal pain.   CHIEF COMPLAINT:  Right upper quadrant pain, nausea, vomiting, and bloating.   HISTORY OF PRESENT ILLNESS:  The patient states that she was in her usual  state of health and she developed some right upper quadrant pain with nausea  and vomiting approximately 2 a.m. on Thursday morning after eating some red  sauce and some spaghetti dinner.  This pain did not abate despite the fact  that she took several Tums and she came to the emergency room where she was  admitted on the medical service, and because of her past history surgery was  consulted.   PHYSICAL EXAMINATION:  GENERAL:  Discloses a pleasant 70 year old white  female who is well-known to me in the past.  She is uncomfortable but in no  acute distress.  VITAL SIGNS:  She approximately 136 pounds.  She is 5 feet 1 inches.  Temperature 99.0, blood pressure 106/53, heart rate 71 per minute and  regular, respirations are 18, and her O2 saturation is 94.  HEENT:  Head:  Normocephalic.  Eyes:  Extraocular movements are intact.  Pupils are round and react to light and accomodation.  Sclerae are a normal  tincture.  Nose and oral mucosa are moist.  NECK:  Supple and cylindrical without jugular vein distention, thyromegaly,  tracheal deviation.  There are no bruits auscultated and no adenopathy.  CHEST:  Clear both anterior and posterior auscultation.  The patient has a  chronic cough.  BREASTS AND AXILLA:  Without masses.  HEART:  Regular  rhythm.  ABDOMEN:  Bowel sounds are somewhat diminished.  The patient is tender in  the lateral aspect of the right upper quadrant.  There are no masses.  There  are well healed previous surgical incisions without hernias.  RECTAL:  There is guaiac negative stool.  PELVIC:  Deferred.  EXTREMITIES:  Within normal limits.  No clubbing, varicosities, or cyanosis  appreciated.   REVIEW OF SYSTEMS:  OB/GYN:  She is a gravida 4 para 3 cesarean 1 abortus 0  female.  Her last menstrual period was in 1982.  She has strong family  history of carcinoma of the breast with two sisters having carcinoma of the  breast.  I have been following her for this, following her serial  mammographies which up to the present have been negative.  She has had a  recent  fine needle aspiration in January at [facility] and this was reported  as negative.  NEURO:  The patient has anxiety.  She has no history of  migraines or seizures.  ENDOCRINE:  No history of diabetes or thyroid  disease.  CARDIOPULMONARY:  The patient smokes at least a pack of cigarettes  per day.  She has had no complaints of chest pain per se or angina-type  symptoms.  MUSCULOSKELETAL:  The patient has chronic arthritic discomforts  primarily in her right knee.  GI:  No past history of hepatitis,  constipation, diarrhea, bright red rectal bleeding, or melena.  There is no  history of unexplained weight loss, no history of inflammatory bowel  disease.  The patient has had no recent colonoscopy.  GU:  No history of  dysuria or frequency or history of nephrolithiasis.  SOCIOECONOMIC:  The  patient works as a Dispensing optician primarily.   LABORATORY DATA:  White count 17,000 with an H&H of 11.6 and 35.1, 69  neutrophils are appreciated.  BUN is 4, creatinine 0.6, amylase and lipase  are not elevated at 73 and 40 respectively.  She had an diagnostic acute  abdominal series which showed mild cardiac enlargement with some pulmonary  venous hypertension.  There  was some question of an 8 mm nodule or  projection in the left mid lobe and follow-up CT scan is requested in a  month's time.   Abdomen showed no dilatation or abnormal air-fluid levels.   Liver function studies were not performed; these have been ordered.   PAST SURGICAL HISTORY:  1. Abdominal hysterectomy in 1982.  2. Appendectomy in the 1970s.  3. 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   She has had no history since then.   ALLERGIES:  She states that she is allergic to TERRAMYCIN in the past and  PENICILLIN.   IMPRESSION:  The patient's symptoms are highly suggestive to me of  gallbladder disease.  We will check a STAT sonogram of her gallbladder.  We  will also check some liver function studies.  If these are negative I will  proceed with a CT scan with oral and IV contrast.  I would like to evaluate  her previous anastomosis site and the possibility that she might need  endoscopy to rule out any channel ulcer which is less likely but may be  considered.  At any rate, I will follow this patient with the medical  service.   Thank you for the consultation.                                               Barbaraann Barthel, M.D.    WB/MEDQ  D:  04/22/2002  T:  04/22/2002  Job:  [id number]   cc:   Mila Homer. Sudie Bailey, M.D.  [address] [place], [place] [address]  Fax: [phone]   Oneal Deputy. Juanetta Gosling, M.D.  [address]  [place]  [place] [address]  Fax: [phone]

## 2010-06-14 NOTE — Group Therapy Note (Signed)
NAME:  Marissa Turner, Marissa Turner                          ACCOUNT NO.:  [id number]   MEDICAL RECORD NO.:  [id number]                   PATIENT TYPE:  INP   LOCATION:  A323                                 FACILITY:  [facility]   PHYSICIAN:  Mila Homer. Sudie Bailey, M.D.           DATE OF BIRTH:  Dec 05, 1940   DATE OF PROCEDURE:  DATE OF DISCHARGE:                                   PROGRESS NOTE   SUBJECTIVE:  This 70 year old continues in the ICU.  She really feels much  better.  She did have something to eat for lunch yesterday and then had a  gnawing, uncomfortable feeling in the abdomen which cleared within a few  hours.  She was then made n.p.o. and by this morning now is on clear liquids  given her favorable enzyme changes.   Her gallbladder surgery was about two months ago at [facility].  She  tells me that really pretty much the month of June and July she had been  having gnawing, uncomfortable recurrent mid abdominal pains.  She really  feels much better this morning.   She also was having problems yesterday afternoon going off nicotine.  I  recommended nicotine gum, but the gum was too sticky for her dentures, and  she just decided to stop this and just use alprazolam alone.  By her  history, she cannot tolerate the nicotine patch.   She continues right now on IV D-5 half normal saline, 10 mEq KCl/L, 75 mL an  hour, pantoprazole 40 mg IV q.12h., digoxin 0.125 mg p.o. daily, diltiazem  300 mg p.o. daily, metoprolol 25 mg b.i.d., and Sertraline 150 mg p.o.  daily.  She is on p.r.n. alprazolam 1 mg as often as q.4h., Vicodin q.i.d.  p.r.n., and zolpidem 10 mg q.h.s. p.r.n. sleep.   OBJECTIVE:  She is sitting up in bed with family in attendance.  She is in  no acute distress.  Well-developed, well-nourished.  Oriented and alert.  Her temperature is 98.7, pulse 54, respiratory rate 20, blood pressure  95/53.  Heart has irregular irregularity, rate of 60-70.  Lungs are clear  throughout,  moving air well.  Abdomen is soft, without hepatosplenomegaly or  mass or tenderness today.  There is no edema of the ankles.  Skin turgor is  normal, and mucous membranes are moist.   LABORATORY AND ACCESSORY DATA:  Review of the blood tests today showed a  potassium of 3, glucose 113, BUN 5.  LFTs showed alkaline phosphatase 248  (down from 277 yesterday), SGOT 312 (down from 1124 yesterday), SGPT 348  (down from 544 yesterday).  Further amylase was 580 (down from 1482  yesterday), lipase 294 (down from 2448 yesterday).  Her PT was 34.9, INR of  6 today.   ASSESSMENT:  1. Pancreatitis secondary to a common bile duct stone.  2. Hepatitis secondary to a common bile duct stone.  3. Hypokalemia.  4.  Anticoagulation.  5. Chronic atrial fibrillation.  6. Tobacco use disorder.  7. Status post cholecystectomy recently at [facility].  8. Status post Billroth II.  9. 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   PLAN:  She is now on clear liquids.  Ultrasound planned for tomorrow.  She  received vitamin K for her INR.  She is also getting p.o. KCl  supplementation.  Recheck of LFTs, amylase, lipase, CBC, MET-7, PT/INR are  due for tomorrow.  Transfer from the ICU to the floor.  Discontinue the  cardiac monitor.  Long discussion with the family.   ADDENDUM:  Time spent reviewing her electronic medical record, the paper  medical record, long discussion with the family, examination of the patient,  formulation of a plan, and dictation of note was 30 minutes.                                               Mila Homer. Sudie Bailey, M.D.    SDK/MEDQ  D:  08/21/2002  T:  08/21/2002  Job:  [id number]

## 2010-06-14 NOTE — Discharge Summary (Signed)
NAME:  Marissa Turner, Marissa Turner                          ACCOUNT NO.:  [id number]   MEDICAL RECORD NO.:  [id number]                   PATIENT TYPE:  INP   LOCATION:  3712                                 FACILITY:  [facility]   PHYSICIAN:  Cristy Hilts. Jacinto Halim, M.D.                  DATE OF BIRTH:  13-Sep-1940   DATE OF ADMISSION:  06/01/2002  DATE OF DISCHARGE:  06/10/2002                                 DISCHARGE SUMMARY   DISCHARGING PHYSICIAN:  Dr. Jacinto Halim.   DISCHARGE DIAGNOSES:  1. Atrial fibrillation status post TEE and cardioversion this admission     revealing a suspicion for left ventricular thrombus, mild left     ventricular hypertrophy, and left atrial enlargement.  2. Coumadin anticoagulation therapy.  3. Long tobacco use.  4. Urinary tract infection with elevated white blood cell count on     antibiotic therapy.  5. Depression and anxiety.  6. Peptic ulcer disease status post GI consult this admission.  7. Status post splenectomy.  8. Status post cholecystectomy for cholelithiasis.  9. Osteoarthritis.   HISTORY OF PRESENT ILLNESS/HOSPITAL COURSE:  The patient is a 70 year old  Caucasian lady who presented to the office in [place] on Jun 01, 2002.  She was evaluated by Dr. Domingo Sep who found him to be in atrial fibrillation  and flutter with a rapid ventricular response.  The decision was to admit  the patient the telemetry unit, adjust his medications to enhance heart rate  control.  Plans for Coumadin and heparin cross over for possible ischemic  workup at some point and also for TEE and cardioversion.  The patient was  assessed in the hospital and was asymptomatic with atrial fibrillation.  INR  was therapeutic at 2.9.  But on admission, she was hyperkalemic at 3.1.  It  was repleted.  On the next day, potassium was 4.6.  Since Coumadin was  therapeutic, the decision was to discontinue heparin, and switch from  Cardizem IV to p.o. and discharge the patient home with plans to bring  her  back in 6-8 weeks for cardioversion.  The patient developed another episode  of increased heart rate and required another IV Cardizem.  She also  developed elevated liver enzymes.  GI consulted was requested.  On Jun 05, 2002, she was seen by Dr. Marina Goodell and acute hepatitis and other occult liver  problems were suspected.  Then, serology studies were performed, and showed  negative hepatitis and B surface antigen.  Negative antibody for hepatitis C  and hepatitis D antibody.   The patient did not have any complaint of GI disturbances, and was followed  by GI without any signs and symptoms acute liver disease, although liver  function tests remained elevated.  She developed urinary tract infection  with elevated white blood cell count.  On May 14, she was started on  antibiotic Macrodantin 50 mg q.i.d.  On  May 10, she underwent EP evaluation  with Dr. Graciela Husbands.  He recommended to proceed with TEE and cardioversion  because her rate was difficult to control.  The patient's heart rate  fluctuated between 70 and 140 and 160.  She underwent __________ on Jun 10, 2002.   A TEE was performed by Dr. Jacinto Halim on 06/10/2002.  It showed mild mitral  regurg and tricuspid regurg and mild atherosclerotic changes in the aorta  and mild left ventricular hypertrophy and mild left atrial enlargement.  Also, __________ defect suspicious for thrombus in the left atrial  appendage.  There was a bit suspicion of thrombus.  Cardioversion was not  performed.   At followup, he was seen by Dr. Graciela Husbands who recommended continuing the  anticoagulation therapy.  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   The patient remained in the hospital because her INR was markedly low at 1.2  and 1.3.  On the day of her discharge, her INR was 1.9.  She was assessed by  Dr. Jacinto Halim and found to be stable for discharge home.  INR was  1.9 on that  day, hemoglobin 12.4, hematocrit 38.1, white blood cell count 13.8,  platelets 488.  BMP revealed a sodium 141, potassium 4.6, chloride 106, CO2  30, glucose 101, BUN 13, creatinine 0.7.   Her last liver function tests in the hospital showed AST 125, ALT 197,  alkaline phosphatase 449, total bilirubin 1.2, direct bilaterally 0.5,  indirect bilirubin 0.7.  Lipid profile showed a cholesterol of 177,  triglycerides 146, HDL 37, LDL 111.   DISCHARGE MEDICATIONS:  Zoloft 100 mg q.d., Lanoxin 0.125 mg q.d., Lopressor  25 mg b.i.d., Cardizem 300 mg q.d., and Coumadin 10 mg q.d., Macrodantin 50  mg q.i.d. for seven days.   DISCHARGE INSTRUCTIONS:  Follow up with the blood work on Thursday,  06/16/2002.  Recheck pro-time and INR after which the Coumadin will be  adjusted.  Follow up with Dr. Marina Goodell at [facility] for GI evaluation as an  outpatient.  The phone number was provided.  The patient was instructed to  call for appointment.   Dr. Domingo Sep will see the patient on 06/24/2002 at 11:15 a.m.     Raymon Mutton, P.A.                    Cristy Hilts. Jacinto Halim, M.D.    MK/MEDQ  D:  06/13/2002  T:  06/13/2002  Job:  [id number]

## 2010-06-14 NOTE — H&P (Signed)
NAME:  Marissa Turner, Marissa Turner                          ACCOUNT NO.:  [id number]   MEDICAL RECORD NO.:  [id number]                   PATIENT TYPE:  EMS   LOCATION:  ED                                   FACILITY:  [facility]   PHYSICIAN:  Edward L. Juanetta Gosling, M.D.             DATE OF BIRTH:  [birth year]   DATE OF ADMISSION:  04/21/2002  DATE OF DISCHARGE:                                HISTORY & PHYSICAL   REASON FOR ADMISSION:  Abdominal pain.   HISTORY OF PRESENT ILLNESS:  This is a 70 year old patient of Dr. Michelle Nasuti  who has had some sort of a gastric diversion surgery which I suspect may  have been a Billroth II some years ago.  She presented to the emergency room  today after having had abdominal pain that started about 2 a.m. this  morning.  When she came to the emergency room she was found to be in marked  abdominal pain.  She was nauseated and vomiting.  She underwent flat and  upright x-ray which did not show any definite obstruction.  There is some  conflicting history as to whether she has had a cholecystectomy or not.  She  received pain medications and is somewhat better but still having pain.  She  does not have any history of any sort of renal stones or any other problems  as far as she knows.  The pain is listed as a 10/10.  She has had problems  with anxiety and depression and has been on Zoloft and Xanax in the past.  She had a splenectomy at the time of her surgery and, apparently, an  appendectomy as well.   PAST MEDICAL HISTORY:  Otherwise is positive for anxiety and depression.  She has had problems with getting nauseated with pain medications in the  past.  She says that she thinks that she had an episode of gallbladder  attack about two years ago, but her family says that they do not think that  she actually has a gallbladder.  It is not really clear.  She does have a  history of peptic ulcer disease.  She had seen Dr. Malvin Johns for that in the  past.   FAMILY HISTORY:   Her mother died of a myocardial infarction.   SOCIAL HISTORY:  The patient has a history of about one to two packs a day  of smoking.   REVIEW OF SYSTEMS:  Except as mentioned is negative.  She had been feeling  well until this morning.   PHYSICAL EXAMINATION:  GENERAL:  She is writhing with pain.  VITAL SIGNS:  Temperature 98.6, pulse 91, respirations 16, blood pressure  142/79, O2 saturation is 96%.  HEENT:  Mucous membranes are slightly dry.  Nose and throat are clear.  NECK:  Supple.  Without masses.  CHEST:  Shows some rhonchi bilaterally.  HEART:  Regular.  ABDOMEN:  Very distended, and  she is very tender in the right upper  quadrant.  RECTAL:  Stool is apparently heme-negative.  CNS:  Grossly intact.   LABORATORY DATA:  Flat and upright abdominal film does not show any evidence  of obstruction.   Her white blood count is 16,700, hemoglobin is 12, platelets 320,000.  Prothrombin time 12.1.  Hepatic function profile is normal.  Sodium 132,  potassium 4.1, chloride 98, CO2 27, glucose 103, BUN 9, creatinine 0.5,  calcium 8.7.  Amylase and lipase I have been told are normal, but I cannot  find the numbers.  Her cardiac enzymes show a CK of 136, MB of 7.1, but with  troponin of 0.01.   Her EKG is not normal but does not show myocardial infarction.  It shows  nonspecific ST and T-wave changes.   ASSESSMENT:  I think this is probably gallbladder, but the concern, of  course, is that she does have some cardiac findings as well.  Will plan to  go ahead with antibiotics, pain medications, and have her get repeat  enzymes, etc.                                               Edward L. Juanetta Gosling, M.D.    ELH/MEDQ  D:  04/21/2002  T:  04/22/2002  Job:  [id number]

## 2010-06-14 NOTE — Op Note (Signed)
NAME:  Marissa Turner, Marissa Turner                          ACCOUNT NO.:  [id number]   MEDICAL RECORD NO.:  [id number]                   PATIENT TYPE:  INP   LOCATION:  A323                                 FACILITY:  [facility]   PHYSICIAN:  Lionel December, M.D.                 DATE OF BIRTH:  01-25-1941   DATE OF PROCEDURE:  08/23/2002  DATE OF DISCHARGE:                                 OPERATIVE REPORT   PROCEDURE:  Attempted endoscopic retrograde cholangiopancreatography.   ENDOSCOPIST:  Lionel December, M.D.   INDICATIONS FOR PROCEDURE:  [name] is a  70 year old Caucasian female  who presents with biliary pancreatitis.  She is status post open  cholecystectomy about 2 months ago.  Her ultrasound did not show any  evidence of choledocholithiasis.   [name] has had D2 twenty years ago. She is aware that we may not be able  to complete the procedure because of the prior gastric surgery.  The  procedures and risks were reviewed with the patient and informed consent was  obtained.   PREOPERATIVE MEDICATIONS:  Cetacaine spray for oropharyngeal topical  anesthesia.  Fentanyl 50 mcg IV, Versed 12 mg IV in divided doses, Glucagon  0.5 mg IV.   FINDINGS:  Procedure performed in radiology department.  Dr. Tyron Russell assisted  in the help with fluoroscopy.  The patient was placed in semiprone position.  Vital signs and O2 saturation were monitored during procedure and remained  stable.  The procedure was started with the Olympus video or Q-scope.  The  scope was passed via the oropharynx into the esophagus.  There was a  moderate sized gastric pouch with a patent gastrojejunostomy.  The scope was  passed into afferent loop.  This loop was quite large.  I could never get to  the second part of the duodenum where I could see the ampulla.  On  fluoroscopy it was almost like a J-shaped.  When I pushed the scope it  tended to make the loop first, and she was uncomfortable, therefore, I did  not force the  scope. This scope was exchanged with the duodenoscope and ran  into the same problem; endoscope was therefore withdrawn.  The patient  tolerated the procedure well.   FINAL DIAGNOSES:  Incomplete endoscopic retrograde cholangiopancreatography  secondary to long tortuous afferent loop.   RECOMMENDATIONS:  1. Will start her on a low fat diet.  2.     Will arrange for an MRCP on an outpatient basis.  3. Will resume her Coumadin and cover with Lovenox until INR is in     therapeutic range.  4. Will also request cholecystectomy records from [facility] and [facility] where she had     intraoperative cholangiogram.  Lionel December, M.D.    NR/MEDQ  D:  08/23/2002  T:  08/24/2002  Job:  [id number]   cc:   Mila Homer. Sudie Bailey, M.D.  [address] [place], [place] [address]  Fax: [phone]

## 2010-06-14 NOTE — H&P (Signed)
NAME:  Marissa Turner, Marissa Turner                          ACCOUNT NO.:  [id number]   MEDICAL RECORD NO.:  [id number]                   PATIENT TYPE:  INP   LOCATION:  IC03                                 FACILITY:  [facility]   PHYSICIAN:  Mila Homer. Sudie Bailey, M.D.           DATE OF BIRTH:  [birth year]   DATE OF ADMISSION:  08/19/2002  DATE OF DISCHARGE:                                HISTORY & PHYSICAL   HISTORY AND PHYSICAL:  This 71 year old was home, doing canning outside in  her outside kitchen today, when she developed chest discomfort.  She had had  a similar episode of chest discomfort two days prior to this.  She has had a  number of children in the house screaming and crying and really bothering  her nerves.  Further, they had taken apart her deceased son's room during  that time; the son she lost four years ago of unknown causes after a lengthy  stay at the [facility] in [place] at age 38.   She had a Billroth II done years ago; at the time incidental splenectomy.  More recently, in the last month, she had acute cholecystitis at which time  she had atrial fibrillation.  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  She has been anticoagulated at least since May.   CURRENT MEDICATIONS:  1. Zoloft 100 mg daily.  2. Lanoxin 0.125 mg daily.  3. Lopressor 25 mg b.i.d.  4. Cardizem 300 mg daily.  5. Coumadin.  6. She has been on alprazolam for anxiety 1 mg at least b.i.d.   She called an ambulance and apparently within an hour of being on O2 her  chest discomfort had cleared.  The area, she said, where the discomfort  actually is in the epigastrium.  She is now asymptomatic.   PHYSICAL EXAMINATION:  GENERAL:  This is a well-developed, well-nourished,  70 year old woman who was in no acute distress.   Color i good.  She appeared  to be a good historian.  Speech was normal without slurring.  She did burst  into tears, however, when she mentioned the four year anniversary of her  son's death coming up next month.  HEART:  Irregularly irregular, rate about 70.  LUNGS:  Appeared clear throughout.  Moving air well.  ABDOMEN:  Soft without hepatosplenomegaly or mass.  There is no abdominal  tenderness.  EXTREMITIES:  No edema of the ankles.   LABORATORY DATA:  EKG showed atrial fibrillation with ventricular response  69.   Chest x-ray showed just chronic changes, nothing acute.  There was  cardiomegaly noted.   Blood work was essentially normal including CBC, MET-7 and cardiac enzymes  except for Troponin 0.12 and INR 7.5.   ADMISSION DIAGNOSES:  1. Chest pain possibly  secondary to coronary artery disease.  2. Chronic atrial fibrillation.  3. Anticoagulation.  4. Tobacco use disorder.  5. Depression.  6. Status post cholecystectomy.  7. Status post Billroth II.  8. Status post incidental splenectomy.   PLAN:  Is to have her in the ICU at [facility].  Transfer to [facility] where she will be followed by [facility] was offered  but she declined this.  If Troponin comes down on its own and she stays  asymptomatic, she will be discharged home with followup in a few days at  [facility].  If the Troponin stays elevated, she will be  transferred down ASAP hopefully to have a cardiac catheterization done  tomorrow.  She is eager to spend as little time in the hospital as possible  since her 44th wedding anniversary is coming up in two days.   We will keep her on IVs for access and also for nitroglycerin.  She will  continue on Zoloft and we will increase the dose to 150 mg daily, since she  did not feel the 100 mg daily was helping her anymore.  We will also  continue Lanoxin 0.125 mg daily with Metoprolol 25 mg b.i.d. and Cardizem  300 mg daily.   She will be on Ambien 10 mg at bedtime for sleep and Xanax 1  mg q.4 h. p.r.n.  Given the possible CAD, we are holding her cigarette  smoking and also nicotine patch.   Total time spent with this patient in reviewing the paper record and  reviewing the electronic record, discussion with ER physician, formulation  of additional plan in the hospital and discussed with nursing here with the  patient here was 45 minutes.                                               Mila Homer. Sudie Bailey, M.D.    SDK/MEDQ  D:  08/19/2002  T:  08/19/2002  Job:  [id number]

## 2010-06-14 NOTE — Consult Note (Signed)
NAME:  Marissa Turner, Marissa Turner                          ACCOUNT NO.:  [id number]   MEDICAL RECORD NO.:  [id number]                   PATIENT TYPE:  INP   LOCATION:  IC03                                 FACILITY:  [facility]   PHYSICIAN:  Lionel December, M.D.                 DATE OF BIRTH:  06-28-1940   DATE OF CONSULTATION:  DATE OF DISCHARGE:                                   CONSULTATION   REASON FOR CONSULTATION:  Acute pancreatitis.   HISTORY OF PRESENT ILLNESS:  Marissa Turner is a 70 year old Caucasian female  who was admitted to Dr. Michelle Nasuti service on August 19, 2002 with mildly  elevated troponin level, ASA, and chest pain.  However, the patient tells me  that she never had any chest pain. She was complaining of abdominal pain.  She had amylase and lipase by Dr. Sudie Bailey yesterday and these were markedly  elevated.  He, therefore, requested GI consultation.  At that time I asked  Dr. Sudie Bailey to do LFTs which had not been done.  The patient, this morning,  feels a lot better.  She states that the pain has now resolved.  She now has  soreness.  She states that she had more or less sudden onset of sharp pain  across her upper abdomen prior to her admission and she also had a similar  pain which resolved spontaneously and she did not seek any medical  attention.  She has not experienced any chest pain, dyspnea, nausea,  vomiting, hematemesis, melena, or rectal bleeding.  She denies anorexia or  weight loss.   MEDICATIONS:  1. She is presently on Zoloft 50 mg daily.  2. Lanoxin 0.125 mg daily.  3. Cardizem 3 mg daily.  4. Lopressor 25 mg b.i.d.  5. Protonix 40 mg IV q.12h.  6. Hydrocodone p.o. 1 q.4h. p.r.n.  7. Xanax 1 mg q.4h. p.r.n.  8. Ambien 10 mg q.h.s. p.r.n.  9. Tylenol 650 mg q.4h. p.r.n.   PAST MEDICAL HISTORY:  History of peptic ulcer disease. She had Billroth II  in 1984 along with splenectomy and incidental appendectomy.  She had open  cholecystectomy about 2 months ago at  [facility].  At the same time she was  diagnosed with atrial fibrillation and had atrial thrombus and has been on  anticoagulant. She also has had tonsillectomy years ago.   ALLERGIES:  PENICILLIN causes hives.  She states that she gets nausea and  vomiting with NICOTINE PATCH.   FAMILY HISTORY:  Significant for breast carcinoma in 3 sisters all of whom  are deceased.  She has 6 brothers and 2 sisters, living, who are doing  fairly well.   SOCIAL HISTORY:  She is married.  She has 2 sons.  One son was stillborn,  and she lost 1 son a few years ago, at age 23, with acute respiratory  illness. She has been smoking for years and  now has quit down to less than  10 cigarettes per day. She does not drink alcohol.  She has worked Education officer, environmental  houses and churches off-and-on.   PHYSICAL EXAMINATION:  GENERAL: A pleasant, well-developed, thin, Caucasian  female who is in no acute distress.  VITAL SIGNS:  She weighs 128.5 pounds. She is 5 feet 1 inch tall.  Pulse is  66/minute and regular.  Blood pressure 117/90, respirations 16, and  temperature is 97.5.  HEENT:  Conjunctivae is pink.  Sclerae are anicteric.  Oropharyngeal mucosa  is normal.  She has upper and lower dentures in place.  NECK:  Without masses or thyromegaly.  CARDIOVASCULAR:  Cardiac exam with irregular rhythm.  Normal S1 and S2.  No  murmur or gallop noted.  LUNGS:  Clear to auscultation.  ABDOMEN:  Abdomen is symmetrical.  She has 2 well-healed scars.  Bowel  sounds are normal.  Palpation reveals soft abdomen with mild midepigastric  and periumbilical tenderness.  No organomegaly or masses.  RECTAL: Exam was deferred.  EXTREMITIES:  No clubbing or edema noted.   LABS:  From admission WBC 14.9, H&H is 12.7 and 38.2.  Platelet count was  317,000.  Her electrolytes were normal, glucose 104, BUN 7, creatinine 0.6,  calcium 8.3.  Her INR was 7.5.  PTT was 76.  Her total CPK was 98, CK/MB was  1.5.  Her troponin II was 0.12.  Her labs  from this morning: H&H is 11.8 and  35.7.  Platelet count is 292,000.  Her INR is 6.0.  LFTs from yesterday:  Bilirubin 0.9, AP 277, AST 1124, ALT 544, serum amylase was 1482 and lipase  2448.  Her LFTs from today: Bilirubin 0.5, AP 248, AST 312, ALT 348, albumin  2.8, amylase is down to 580 and lipase is 294.   ASSESSMENT:  Marissa Turner is a 70 year old Caucasian female who has biliary  pancreatitis. She is status post cholecystectomy a couple of months ago for  acute cholecystitis.  I suspect that she has choledocholithiasis and she may  have had a similar episode 2 weeks ago. The patient has improved the last 24  hours; and, therefore, would hold off giving her any antibiotics. She is  over anticoagulated and this needs to be corrected in preparation for an  endoscopic retrograde cholangiopancreatography.   RECOMMENDATIONS:  1. Ultrasound of upper abdomen with attention to bile duct, as planned for     tomorrow.  2. We gave her vitamin K 6 mg subcu today.  When INR is down to less than 2     she can be started on Lovenox which will be held prior to ERCP.  3. We will go ahead and start her on clear liquids.  4. Please note, that ERCP may be difficult given that she has had Billroth     II anastomosis.   I would like to thank Dr. Sudie Bailey for the opportunity to participate in the  care of this nice lady.                                               Lionel December, M.D.    NR/MEDQ  D:  08/21/2002  T:  08/21/2002  Job:  [id number]

## 2010-06-14 NOTE — Group Therapy Note (Signed)
NAME:  BETHAN, ADAMEK                          ACCOUNT NO.:  [id number]   MEDICAL RECORD NO.:  [id number]                   PATIENT TYPE:  INP   LOCATION:  A323                                 FACILITY:  [facility]   PHYSICIAN:  Mila Homer. Sudie Bailey, M.D.           DATE OF BIRTH:  April 10, 1940   DATE OF PROCEDURE:  DATE OF DISCHARGE:                                   PROGRESS NOTE   SUBJECTIVE:  She is doing much better today.  She has had diarrhea about 4  times but otherwise has been taking clear liquids.   OBJECTIVE:  She is walking around in the hall with an IV with her husband.  Temperature is 97.4, pulse 67, respiratory rate 16, blood pressure 104/74.  She weighs 125.2 pounds.  She is well-developed, well-nourished.  No acute  distress today.  Oriented and alert.  Physical exam done shows she is  breathing well, moving air well.  Lungs are clear throughout.  Heart has a  slightly irregular rhythm without murmur, rate of about 70.  Abdomen is  soft, without hepatosplenomegaly or mass.  There is mild tenderness in the  lower abdomen.  There is no edema in the ankles.  Skin turgor is normal.  Mucous membranes are moist.  Apparently, her gallbladder ultrasound today  was negative with a normal-caliber common bile duct.   H&H today was 11.7/37.3, white cell count 12,100.  Her INR is 2.4.  Alkaline  phosphatase today 211 with SGOT 120, SGPT 247, amylase 143, lipase 85   ASSESSMENT:  1. The patient probably had a small gallstone either in place and not seen     on ultrasound or which has already passed.  2. Chronic atrial fibrillation.  3. Anticoagulation.  4. Hepatitis secondary to common bile duct obstruction.  5. Pancreatitis secondary to common bile duct obstruction.   PLAN:  Continue pantoprazole 40 mg daily with metoprolol 25 mg b.i.d.,  digoxin 0.125 mg daily, diltiazem 300 mg daily, KCl 20 mEq b.i.d., and  sertraline 150 mg daily.  Discussed with patient and her husband at  length.  Discussed with Dr. Karilyn Cota over the telephone today.  Plans are for her to  have EGD and ERCP tomorrow.  A sphincterotomy was considered, and I  discussed this with the patient.   ADDENDUM:  Time spent reviewing the patient's electronic medical record,  paper medical record, talking to her and her husband, examining her,  discussion with Dr. Karilyn Cota over the telephone, formulating a plan, and  dictating a note was 25 minutes.                                               Mila Homer. Sudie Bailey, M.D.    SDK/MEDQ  D:  08/22/2002  T:  08/23/2002  Job:  [id number]  

## 2010-06-14 NOTE — Group Therapy Note (Signed)
   NAME:  Marissa Turner, Marissa Turner                          ACCOUNT NO.:  [id number]   MEDICAL RECORD NO.:  [id number]                   PATIENT TYPE:  INP   LOCATION:  IC03                                 FACILITY:  [facility]   PHYSICIAN:  Mila Homer. Sudie Bailey, M.D.           DATE OF BIRTH:  1940-05-07   DATE OF PROCEDURE:  04/22/2002  DATE OF DISCHARGE:  04/23/2002                                   PROGRESS NOTE   HISTORY OF PRESENT ILLNESS:  She developed fairly severe abdominal pain,  started two days ago.  Pain became worse yesterday.  She was admitted to the  hospital last night.  She did have abdominal surgery though Dr. Malvin Johns,  general surgeon in [place], about 20 years ago.  At the time, an  incidental splenectomy was performed.  She says this morning she is still  having fairly severe soreness in the right upper quadrant.  She does not  remember whether she had a cholecystectomy at that time 20 years ago.  Continues to smoke.   PHYSICAL EXAMINATION:  GENERAL:  She is supine in bed, but with a good deal  of soreness in the right upper quadrant.  She is well-developed, well-  nourished, good historian, alert and oriented.  VITAL SIGNS:  Temperature is  99 degrees, pulse 71, respiratory rate 18, blood pressure 106/53.  HEART:  Regular rate and rhythm at about 70.  LUNGS:  Clear and moving air well.  ABDOMEN:  Somewhat distended, and she has a good deal of tenderness to the  right upper quadrant on minimal palpation.  EXTREMITIES:  No edema of the  ankles.  HEENT:  Mucous membranes appear moist.   LABORATORY DATA:  Today her white blood cell count was 17,000.   ASSESSMENT:  1. Probable acute cholecystitis.  2. Status post incidental splenectomy.   PLAN:  I have reviewed old records from the office.  We still have not  located the surgical report from 20 years ago.  I have discussed the case  with Dr. Shaune Pollack, who admitted her last night.  Gallbladder ultrasound is  planned for  this morning.  In the meantime, I will continue with her IV  morphine for pain control, IV Phenergan, and Cefotan 1 g q.8h., somantadine  40 mg IV daily.  Dr. Malvin Johns is being consulted.   I spent 25 minutes in coordination of care of this patient today.                                               Mila Homer. Sudie Bailey, M.D.    SDK/MEDQ  D:  04/22/2002  T:  04/23/2002  Job:  [id number]

## 2010-07-08 ENCOUNTER — Ambulatory Visit (INDEPENDENT_AMBULATORY_CARE_PROVIDER_SITE_OTHER): Payer: Medicare Other | Admitting: *Deleted

## 2010-07-08 DIAGNOSIS — I4891 Unspecified atrial fibrillation: Secondary | ICD-10-CM

## 2010-07-08 DIAGNOSIS — Z7901 Long term (current) use of anticoagulants: Secondary | ICD-10-CM

## 2010-07-08 LAB — POCT INR: INR: 2.3

## 2010-08-05 ENCOUNTER — Encounter: Payer: Medicare Other | Admitting: *Deleted

## 2010-08-07 ENCOUNTER — Ambulatory Visit (INDEPENDENT_AMBULATORY_CARE_PROVIDER_SITE_OTHER): Payer: Medicare Other | Admitting: *Deleted

## 2010-08-07 DIAGNOSIS — I4891 Unspecified atrial fibrillation: Secondary | ICD-10-CM

## 2010-08-07 DIAGNOSIS — Z7901 Long term (current) use of anticoagulants: Secondary | ICD-10-CM

## 2010-08-07 LAB — POCT INR: INR: 2

## 2010-09-04 ENCOUNTER — Encounter: Payer: Medicare Other | Admitting: *Deleted

## 2010-09-05 ENCOUNTER — Ambulatory Visit (INDEPENDENT_AMBULATORY_CARE_PROVIDER_SITE_OTHER): Payer: Medicare Other | Admitting: *Deleted

## 2010-09-05 DIAGNOSIS — I4891 Unspecified atrial fibrillation: Secondary | ICD-10-CM

## 2010-09-05 DIAGNOSIS — Z7901 Long term (current) use of anticoagulants: Secondary | ICD-10-CM

## 2010-09-05 LAB — POCT INR: INR: 2.2

## 2010-10-03 ENCOUNTER — Encounter: Payer: Medicare Other | Admitting: *Deleted

## 2010-10-10 ENCOUNTER — Ambulatory Visit (INDEPENDENT_AMBULATORY_CARE_PROVIDER_SITE_OTHER): Payer: Medicare Other | Admitting: *Deleted

## 2010-10-10 DIAGNOSIS — Z7901 Long term (current) use of anticoagulants: Secondary | ICD-10-CM

## 2010-10-10 DIAGNOSIS — I4891 Unspecified atrial fibrillation: Secondary | ICD-10-CM

## 2010-11-07 ENCOUNTER — Encounter: Payer: Medicare Other | Admitting: *Deleted

## 2010-11-11 ENCOUNTER — Ambulatory Visit (INDEPENDENT_AMBULATORY_CARE_PROVIDER_SITE_OTHER): Payer: Medicare Other | Admitting: *Deleted

## 2010-11-11 DIAGNOSIS — Z7901 Long term (current) use of anticoagulants: Secondary | ICD-10-CM

## 2010-11-11 DIAGNOSIS — I4891 Unspecified atrial fibrillation: Secondary | ICD-10-CM

## 2010-11-12 ENCOUNTER — Other Ambulatory Visit: Payer: Self-pay | Admitting: *Deleted

## 2010-11-12 MED ORDER — GABAPENTIN 100 MG PO CAPS: 100.0000 mg | ORAL_CAPSULE | Freq: Two times a day (BID) | ORAL | Status: DC

## 2010-11-19 ENCOUNTER — Encounter: Payer: Self-pay | Admitting: Cardiology

## 2010-12-02 ENCOUNTER — Encounter: Payer: Medicare Other | Admitting: *Deleted

## 2010-12-04 ENCOUNTER — Other Ambulatory Visit: Payer: Self-pay | Admitting: Cardiology

## 2010-12-05 ENCOUNTER — Ambulatory Visit (INDEPENDENT_AMBULATORY_CARE_PROVIDER_SITE_OTHER): Payer: Medicare Other | Admitting: *Deleted

## 2010-12-05 DIAGNOSIS — Z7901 Long term (current) use of anticoagulants: Secondary | ICD-10-CM

## 2010-12-05 DIAGNOSIS — I4891 Unspecified atrial fibrillation: Secondary | ICD-10-CM

## 2010-12-05 LAB — POCT INR: INR: 2

## 2010-12-26 ENCOUNTER — Ambulatory Visit (INDEPENDENT_AMBULATORY_CARE_PROVIDER_SITE_OTHER): Payer: Medicare Other | Admitting: *Deleted

## 2010-12-26 DIAGNOSIS — Z7901 Long term (current) use of anticoagulants: Secondary | ICD-10-CM

## 2010-12-26 DIAGNOSIS — I4891 Unspecified atrial fibrillation: Secondary | ICD-10-CM

## 2011-01-23 ENCOUNTER — Ambulatory Visit (INDEPENDENT_AMBULATORY_CARE_PROVIDER_SITE_OTHER): Payer: Medicare Other | Admitting: *Deleted

## 2011-01-23 DIAGNOSIS — I4891 Unspecified atrial fibrillation: Secondary | ICD-10-CM

## 2011-01-23 DIAGNOSIS — Z7901 Long term (current) use of anticoagulants: Secondary | ICD-10-CM

## 2011-02-17 DIAGNOSIS — C44611 Basal cell carcinoma of skin of unspecified upper limb, including shoulder: Secondary | ICD-10-CM | POA: Diagnosis not present

## 2011-02-17 DIAGNOSIS — D235 Other benign neoplasm of skin of trunk: Secondary | ICD-10-CM | POA: Diagnosis not present

## 2011-02-20 ENCOUNTER — Ambulatory Visit (INDEPENDENT_AMBULATORY_CARE_PROVIDER_SITE_OTHER): Payer: Medicare Other | Admitting: *Deleted

## 2011-02-20 DIAGNOSIS — I4891 Unspecified atrial fibrillation: Secondary | ICD-10-CM

## 2011-02-20 DIAGNOSIS — Z7901 Long term (current) use of anticoagulants: Secondary | ICD-10-CM | POA: Diagnosis not present

## 2011-02-20 LAB — POCT INR: INR: 3.1

## 2011-03-13 ENCOUNTER — Other Ambulatory Visit: Payer: Self-pay | Admitting: *Deleted

## 2011-03-13 MED ORDER — GABAPENTIN 100 MG PO CAPS: 100.0000 mg | ORAL_CAPSULE | Freq: Two times a day (BID) | ORAL | Status: DC

## 2011-03-20 ENCOUNTER — Ambulatory Visit (INDEPENDENT_AMBULATORY_CARE_PROVIDER_SITE_OTHER): Payer: Medicare Other | Admitting: *Deleted

## 2011-03-20 DIAGNOSIS — Z7901 Long term (current) use of anticoagulants: Secondary | ICD-10-CM | POA: Diagnosis not present

## 2011-03-20 DIAGNOSIS — I4891 Unspecified atrial fibrillation: Secondary | ICD-10-CM | POA: Diagnosis not present

## 2011-03-20 LAB — POCT INR: INR: 2.4

## 2011-03-24 DIAGNOSIS — L821 Other seborrheic keratosis: Secondary | ICD-10-CM | POA: Diagnosis not present

## 2011-03-24 DIAGNOSIS — T300 Burn of unspecified body region, unspecified degree: Secondary | ICD-10-CM | POA: Diagnosis not present

## 2011-03-24 DIAGNOSIS — Z85828 Personal history of other malignant neoplasm of skin: Secondary | ICD-10-CM | POA: Diagnosis not present

## 2011-04-05 ENCOUNTER — Other Ambulatory Visit: Payer: Self-pay | Admitting: Cardiology

## 2011-04-17 ENCOUNTER — Ambulatory Visit (INDEPENDENT_AMBULATORY_CARE_PROVIDER_SITE_OTHER): Payer: Medicare Other | Admitting: *Deleted

## 2011-04-17 DIAGNOSIS — Z7901 Long term (current) use of anticoagulants: Secondary | ICD-10-CM | POA: Diagnosis not present

## 2011-04-17 DIAGNOSIS — I4891 Unspecified atrial fibrillation: Secondary | ICD-10-CM | POA: Diagnosis not present

## 2011-04-17 LAB — POCT INR: INR: 2

## 2011-05-15 ENCOUNTER — Ambulatory Visit (INDEPENDENT_AMBULATORY_CARE_PROVIDER_SITE_OTHER): Payer: Medicare Other | Admitting: *Deleted

## 2011-05-15 DIAGNOSIS — Z7901 Long term (current) use of anticoagulants: Secondary | ICD-10-CM | POA: Diagnosis not present

## 2011-05-15 DIAGNOSIS — I4891 Unspecified atrial fibrillation: Secondary | ICD-10-CM | POA: Diagnosis not present

## 2011-06-26 ENCOUNTER — Ambulatory Visit (INDEPENDENT_AMBULATORY_CARE_PROVIDER_SITE_OTHER): Payer: Medicare Other | Admitting: *Deleted

## 2011-06-26 DIAGNOSIS — I4891 Unspecified atrial fibrillation: Secondary | ICD-10-CM

## 2011-06-26 DIAGNOSIS — Z7901 Long term (current) use of anticoagulants: Secondary | ICD-10-CM

## 2011-06-26 LAB — POCT INR: INR: 2.7

## 2011-07-24 DIAGNOSIS — H5200 Hypermetropia, unspecified eye: Secondary | ICD-10-CM | POA: Diagnosis not present

## 2011-07-24 DIAGNOSIS — H524 Presbyopia: Secondary | ICD-10-CM | POA: Diagnosis not present

## 2011-07-24 DIAGNOSIS — H52229 Regular astigmatism, unspecified eye: Secondary | ICD-10-CM | POA: Diagnosis not present

## 2011-07-24 DIAGNOSIS — H2510 Age-related nuclear cataract, unspecified eye: Secondary | ICD-10-CM | POA: Diagnosis not present

## 2011-08-03 ENCOUNTER — Other Ambulatory Visit: Payer: Self-pay | Admitting: Cardiology

## 2011-08-07 ENCOUNTER — Ambulatory Visit (INDEPENDENT_AMBULATORY_CARE_PROVIDER_SITE_OTHER): Payer: Medicare Other | Admitting: *Deleted

## 2011-08-07 DIAGNOSIS — Z7901 Long term (current) use of anticoagulants: Secondary | ICD-10-CM | POA: Diagnosis not present

## 2011-08-07 DIAGNOSIS — I4891 Unspecified atrial fibrillation: Secondary | ICD-10-CM

## 2011-09-11 ENCOUNTER — Other Ambulatory Visit: Payer: Self-pay | Admitting: *Deleted

## 2011-09-11 MED ORDER — GABAPENTIN 100 MG PO CAPS: 100.0000 mg | ORAL_CAPSULE | Freq: Two times a day (BID) | ORAL | Status: DC

## 2011-09-18 ENCOUNTER — Ambulatory Visit (INDEPENDENT_AMBULATORY_CARE_PROVIDER_SITE_OTHER): Payer: Medicare Other | Admitting: *Deleted

## 2011-09-18 DIAGNOSIS — I4891 Unspecified atrial fibrillation: Secondary | ICD-10-CM

## 2011-09-18 DIAGNOSIS — Z7901 Long term (current) use of anticoagulants: Secondary | ICD-10-CM

## 2011-09-19 DIAGNOSIS — I10 Essential (primary) hypertension: Secondary | ICD-10-CM | POA: Diagnosis not present

## 2011-09-19 DIAGNOSIS — I499 Cardiac arrhythmia, unspecified: Secondary | ICD-10-CM | POA: Diagnosis not present

## 2011-09-19 DIAGNOSIS — J40 Bronchitis, not specified as acute or chronic: Secondary | ICD-10-CM | POA: Diagnosis not present

## 2011-09-19 DIAGNOSIS — J410 Simple chronic bronchitis: Secondary | ICD-10-CM | POA: Diagnosis not present

## 2011-10-30 ENCOUNTER — Encounter (INDEPENDENT_AMBULATORY_CARE_PROVIDER_SITE_OTHER): Payer: Medicare Other

## 2011-10-30 DIAGNOSIS — I4891 Unspecified atrial fibrillation: Secondary | ICD-10-CM | POA: Diagnosis not present

## 2011-10-30 DIAGNOSIS — Z7901 Long term (current) use of anticoagulants: Secondary | ICD-10-CM

## 2011-10-31 DIAGNOSIS — Z23 Encounter for immunization: Secondary | ICD-10-CM | POA: Diagnosis not present

## 2011-11-03 ENCOUNTER — Ambulatory Visit (INDEPENDENT_AMBULATORY_CARE_PROVIDER_SITE_OTHER): Payer: Medicare Other | Admitting: *Deleted

## 2011-11-03 DIAGNOSIS — Z7901 Long term (current) use of anticoagulants: Secondary | ICD-10-CM | POA: Diagnosis not present

## 2011-11-03 DIAGNOSIS — I4891 Unspecified atrial fibrillation: Secondary | ICD-10-CM | POA: Diagnosis not present

## 2011-12-01 ENCOUNTER — Other Ambulatory Visit: Payer: Self-pay | Admitting: Cardiology

## 2011-12-09 ENCOUNTER — Ambulatory Visit: Payer: Medicare Other | Admitting: Orthopedic Surgery

## 2011-12-16 ENCOUNTER — Ambulatory Visit: Payer: Medicare Other | Admitting: Orthopedic Surgery

## 2011-12-17 ENCOUNTER — Ambulatory Visit (INDEPENDENT_AMBULATORY_CARE_PROVIDER_SITE_OTHER): Payer: Medicare Other | Admitting: *Deleted

## 2011-12-17 DIAGNOSIS — I4891 Unspecified atrial fibrillation: Secondary | ICD-10-CM

## 2011-12-17 DIAGNOSIS — Z7901 Long term (current) use of anticoagulants: Secondary | ICD-10-CM | POA: Diagnosis not present

## 2011-12-17 LAB — POCT INR: INR: 2.9

## 2012-01-09 ENCOUNTER — Encounter: Payer: Self-pay | Admitting: Cardiology

## 2012-01-09 ENCOUNTER — Ambulatory Visit: Payer: Medicare Other | Admitting: Cardiology

## 2012-01-09 DIAGNOSIS — I482 Chronic atrial fibrillation, unspecified: Secondary | ICD-10-CM | POA: Insufficient documentation

## 2012-01-09 DIAGNOSIS — I10 Essential (primary) hypertension: Secondary | ICD-10-CM | POA: Insufficient documentation

## 2012-01-09 DIAGNOSIS — K279 Peptic ulcer, site unspecified, unspecified as acute or chronic, without hemorrhage or perforation: Secondary | ICD-10-CM | POA: Insufficient documentation

## 2012-01-09 DIAGNOSIS — Z720 Tobacco use: Secondary | ICD-10-CM | POA: Insufficient documentation

## 2012-01-09 DIAGNOSIS — M47817 Spondylosis without myelopathy or radiculopathy, lumbosacral region: Secondary | ICD-10-CM | POA: Insufficient documentation

## 2012-01-09 DIAGNOSIS — Z7901 Long term (current) use of anticoagulants: Secondary | ICD-10-CM | POA: Insufficient documentation

## 2012-01-22 ENCOUNTER — Encounter (INDEPENDENT_AMBULATORY_CARE_PROVIDER_SITE_OTHER): Payer: Medicare Other | Admitting: Cardiology

## 2012-01-22 ENCOUNTER — Telehealth: Payer: Self-pay | Admitting: Cardiology

## 2012-01-22 ENCOUNTER — Encounter: Payer: Self-pay | Admitting: Cardiology

## 2012-01-22 DIAGNOSIS — I4891 Unspecified atrial fibrillation: Secondary | ICD-10-CM | POA: Diagnosis not present

## 2012-01-22 NOTE — Telephone Encounter (Signed)
Patient called and appointment rescheduled.  Patient stated that she had wrong date wrote down.. / tgs

## 2012-01-26 NOTE — Progress Notes (Signed)
This encounter was created in error - please disregard.

## 2012-01-29 ENCOUNTER — Ambulatory Visit (INDEPENDENT_AMBULATORY_CARE_PROVIDER_SITE_OTHER): Payer: Medicare Other | Admitting: *Deleted

## 2012-01-29 DIAGNOSIS — Z7901 Long term (current) use of anticoagulants: Secondary | ICD-10-CM

## 2012-01-29 DIAGNOSIS — I4891 Unspecified atrial fibrillation: Secondary | ICD-10-CM

## 2012-01-29 LAB — POCT INR: INR: 3.7

## 2012-02-01 ENCOUNTER — Other Ambulatory Visit: Payer: Self-pay | Admitting: Cardiology

## 2012-02-13 ENCOUNTER — Encounter: Payer: Self-pay | Admitting: Cardiology

## 2012-02-13 ENCOUNTER — Ambulatory Visit (INDEPENDENT_AMBULATORY_CARE_PROVIDER_SITE_OTHER): Payer: Medicare Other | Admitting: Cardiology

## 2012-02-13 VITALS — BP 110/62 | HR 67 | Ht 60.0 in | Wt 116.1 lb

## 2012-02-13 DIAGNOSIS — I482 Chronic atrial fibrillation, unspecified: Secondary | ICD-10-CM

## 2012-02-13 DIAGNOSIS — F17200 Nicotine dependence, unspecified, uncomplicated: Secondary | ICD-10-CM

## 2012-02-13 DIAGNOSIS — I4891 Unspecified atrial fibrillation: Secondary | ICD-10-CM

## 2012-02-13 DIAGNOSIS — I10 Essential (primary) hypertension: Secondary | ICD-10-CM | POA: Diagnosis not present

## 2012-02-13 DIAGNOSIS — Z720 Tobacco use: Secondary | ICD-10-CM

## 2012-02-13 DIAGNOSIS — Z7901 Long term (current) use of anticoagulants: Secondary | ICD-10-CM | POA: Diagnosis not present

## 2012-02-13 NOTE — Progress Notes (Deleted)
Name: Marissa Turner    DOB: 07-15-1940  Age: 72 y.o.  MR#: [id number]       PCP:  Avon Gully, MD      Insurance: @PAYORNAME @   CC:    Chief Complaint  Patient presents with  . Follow-up   MEDICATION LIST  VS BP 110/62  Pulse 67  Ht 5' (1.524 m)  Wt 116 lb 1.9 oz (52.672 kg)  BMI 22.68 kg/m2  SpO2 94%  Weights Current Weight  02/13/12 116 lb 1.9 oz (52.672 kg)  03/26/10 118 lb (53.524 kg)    Blood Pressure  BP Readings from Last 3 Encounters:  02/13/12 110/62  03/26/10 131/82     Admit date:  (Not on file) Last encounter with RMR:  02/01/2012   Allergy Allergies  Allergen Reactions  . Codeine   . Diphenhydramine Hcl   . Estrogens Other (See Comments)    edema  . Nicoderm (Nicotine) Dermatitis    Transdermal nicotine delivery systems  . Penicillins     Current Outpatient Prescriptions  Medication Sig Dispense Refill  . ALPRAZolam (XANAX) 0.5 MG tablet Take 0.5 mg by mouth 3 (three) times daily.       . cetirizine (ZYRTEC) 10 MG tablet Take 10 mg by mouth daily.        . citalopram (CELEXA) 20 MG tablet Take 20 mg by mouth daily.      . digoxin (LANOXIN) 0.125 MG tablet Take 125 mcg by mouth daily.       . digoxin (LANOXIN) 0.25 MG tablet Take 0.25 mg by mouth every other day.      [name] DILTIAZEM HCL ER BEADS PO Take 120 mg by mouth daily.       [name] gabapentin (NEURONTIN) 100 MG capsule Take 1 capsule (100 mg total) by mouth 2 (two) times daily.  60 capsule  5  . metoprolol (TOPROL-XL) 25 MG 24 hr tablet Take 12.5 mg by mouth 2 (two) times daily.       . PredniSONE (STERAPRED 12 DAY) 5 MG KIT Take by mouth. 1 by mouth as directed       . traMADol-acetaminophen (ULTRACET) 37.5-325 MG per tablet Take 1 tablet by mouth every 6 (six) hours as needed.        . warfarin (COUMADIN) 5 MG tablet TAKE ONE (1) TABLET EACH DAY  90 tablet  0    Discontinued Meds:    Medications Discontinued During This Encounter  Medication Reason  . ALPRAZolam (XANAX PO) Error  .  Escitalopram Oxalate (LEXAPRO PO) Error    Patient Active Problem List  Diagnosis  . SPINAL STENOSIS  . DJD (degenerative joint disease), lumbosacral  . Atrial fibrillation, chronic  . Chronic anticoagulation  . Tobacco abuse  . Peptic ulcer disease  . Hypertension    LABS Anti-coag visit on 01/29/2012  Component Date Value  . INR 01/29/2012 3.7   Anti-coag visit on 12/17/2011  Component Date Value  . INR 12/17/2011 2.9      Results for this Opt Visit:     Results for orders placed in visit on 01/29/12  POCT INR      Component Value Range   INR 3.7      EKG Orders placed in visit on 02/13/12  . EKG 12-LEAD     Prior Assessment and Plan Problem List as of 02/13/2012            Cardiology Problems   Atrial fibrillation, chronic   Hypertension  Other   SPINAL STENOSIS   DJD (degenerative joint disease), lumbosacral   Chronic anticoagulation   Tobacco abuse   Peptic ulcer disease       Imaging: No results found.   FRS Calculation: Score not calculated. Missing: Total Cholesterol

## 2012-02-13 NOTE — Assessment & Plan Note (Signed)
EKG demonstrates atrial fibrillation with a fairly slow and regular ventricular response.  As result of relative bradycardia, dose of digoxin will be reduced to 0.125 mg every day.

## 2012-02-13 NOTE — Assessment & Plan Note (Addendum)
Stable and therapeutic INRs with warfarin therapy, which will be continued.  No evidence for occult GI blood loss.

## 2012-02-13 NOTE — Assessment & Plan Note (Signed)
Blood pressures have been normal when assessed in office.  Current therapy will be continued.

## 2012-02-13 NOTE — Assessment & Plan Note (Addendum)
Patient advised to discontinue tobacco use.  She reports having quit successfully for 6 years in the past and allows as she might try again.

## 2012-02-13 NOTE — Patient Instructions (Addendum)
Your physician recommends that you schedule a follow-up appointment in: 1 YEAR  Your physician has recommended you make the following change in your medication:  1 - TAKE ONLY 0.125 MG OF DIGOXIN DAILY  STOP SMOKING

## 2012-02-13 NOTE — Progress Notes (Signed)
Patient ID: Marissa Turner, female   DOB: Nov 24, 1940, 72 y.o.   MRN: [id number]  HPI: Scheduled return visit for this very nice woman with chronic atrial fibrillation requiring anticoagulation.  Since her last visit, she has done fine.  She notes no palpitations, lightheadedness or syncope.  She continues to smoke cigarettes at the rate of one pack per day, which she proudly indicates is a reduced rate of usage.  She has never undergone colonoscopy nor does she recall being asked to.  Stool specimen within the past 12 months was Hemoccult negative.  Prior to Admission medications   Medication Sig Start Date End Date Taking? Authorizing Provider  ALPRAZolam Prudy Feeler) 0.5 MG tablet Take 0.5 mg by mouth 3 (three) times daily.    Yes Historical Provider, MD  cetirizine (ZYRTEC) 10 MG tablet Take 10 mg by mouth daily.     Yes Historical Provider, MD  citalopram (CELEXA) 20 MG tablet Take 20 mg by mouth daily.   Yes Historical Provider, MD  digoxin (LANOXIN) 0.125 MG tablet Take 125 mcg by mouth daily.    Yes Historical Provider, MD  DILTIAZEM HCL ER BEADS PO Take 120 mg by mouth daily.    Yes Historical Provider, MD  gabapentin (NEURONTIN) 100 MG capsule Take 1 capsule (100 mg total) by mouth 2 (two) times daily. 09/11/11  Yes Vickki Hearing, MD  metoprolol (TOPROL-XL) 25 MG 24 hr tablet Take 12.5 mg by mouth 2 (two) times daily.    Yes Historical Provider, MD  PredniSONE (STERAPRED 12 DAY) 5 MG KIT Take by mouth. 1 by mouth as directed    Yes Historical Provider, MD  traMADol-acetaminophen (ULTRACET) 37.5-325 MG per tablet Take 1 tablet by mouth every 6 (six) hours as needed.     Yes Historical Provider, MD  warfarin (COUMADIN) 5 MG tablet TAKE ONE (1) TABLET EACH DAY 02/01/12  Yes Kathlen Brunswick, MD   Allergies  Allergen Reactions  . Codeine   . Diphenhydramine Hcl   . Estrogens Other (See Comments)    edema  . Nicoderm (Nicotine) Dermatitis    Transdermal nicotine delivery systems  .  Penicillins   Past medical history, social history, and family history reviewed and updated.  ROS: Denies orthopnea, PND, pedal edema, chest pain, exertional dyspnea or exercise intolerance.  All other systems reviewed and are negative.  PHYSICAL EXAM: BP 110/62  Pulse 67  Ht 5' (1.524 m)  Wt 52.672 kg (116 lb 1.9 oz)  BMI 22.68 kg/m2  SpO2 94%  General-Well developed; no acute distress; hoarse voice Body habitus-proportionate weight and height Neck-No JVD; bilateral carotid bruits Lungs-clear lung fields; resonant to percussion; prolonged expiratory phase Cardiovascular-normal PMI; normal S1 and S2; fairly regular rhythm, but not entirely so Abdomen-normal bowel sounds; soft and non-tender without masses or organomegaly Musculoskeletal-No deformities, no cyanosis or clubbing Neurologic-Normal cranial nerves; symmetric strength and tone Skin-Warm, no significant lesions Extremities-decreased distal pulses; no edema  ASSESSMENT AND PLAN:  [place] Bing, MD 02/13/2012 4:57 PM

## 2012-02-19 ENCOUNTER — Ambulatory Visit (INDEPENDENT_AMBULATORY_CARE_PROVIDER_SITE_OTHER): Payer: Medicare Other | Admitting: *Deleted

## 2012-02-19 DIAGNOSIS — I4891 Unspecified atrial fibrillation: Secondary | ICD-10-CM

## 2012-02-19 DIAGNOSIS — Z7901 Long term (current) use of anticoagulants: Secondary | ICD-10-CM

## 2012-02-19 LAB — POCT INR: INR: 3.4

## 2012-02-26 ENCOUNTER — Other Ambulatory Visit: Payer: Self-pay | Admitting: Cardiology

## 2012-02-27 ENCOUNTER — Encounter: Payer: Self-pay | Admitting: Cardiology

## 2012-02-27 DIAGNOSIS — I4891 Unspecified atrial fibrillation: Secondary | ICD-10-CM | POA: Diagnosis not present

## 2012-02-27 DIAGNOSIS — J410 Simple chronic bronchitis: Secondary | ICD-10-CM | POA: Diagnosis not present

## 2012-02-27 DIAGNOSIS — I10 Essential (primary) hypertension: Secondary | ICD-10-CM | POA: Diagnosis not present

## 2012-03-08 ENCOUNTER — Other Ambulatory Visit: Payer: Self-pay | Admitting: *Deleted

## 2012-03-08 MED ORDER — GABAPENTIN 100 MG PO CAPS: 100.0000 mg | ORAL_CAPSULE | Freq: Two times a day (BID) | ORAL | Status: DC

## 2012-03-10 ENCOUNTER — Ambulatory Visit (INDEPENDENT_AMBULATORY_CARE_PROVIDER_SITE_OTHER): Payer: Medicare Other | Admitting: *Deleted

## 2012-03-10 DIAGNOSIS — I4891 Unspecified atrial fibrillation: Secondary | ICD-10-CM

## 2012-03-10 DIAGNOSIS — Z7901 Long term (current) use of anticoagulants: Secondary | ICD-10-CM | POA: Diagnosis not present

## 2012-03-10 LAB — POCT INR: INR: 1.7

## 2012-03-31 ENCOUNTER — Ambulatory Visit (INDEPENDENT_AMBULATORY_CARE_PROVIDER_SITE_OTHER): Payer: Medicare Other | Admitting: *Deleted

## 2012-03-31 DIAGNOSIS — Z7901 Long term (current) use of anticoagulants: Secondary | ICD-10-CM | POA: Diagnosis not present

## 2012-04-21 ENCOUNTER — Ambulatory Visit (INDEPENDENT_AMBULATORY_CARE_PROVIDER_SITE_OTHER): Payer: Medicare Other | Admitting: *Deleted

## 2012-04-21 DIAGNOSIS — Z7901 Long term (current) use of anticoagulants: Secondary | ICD-10-CM | POA: Diagnosis not present

## 2012-04-21 DIAGNOSIS — I4891 Unspecified atrial fibrillation: Secondary | ICD-10-CM | POA: Diagnosis not present

## 2012-05-05 ENCOUNTER — Ambulatory Visit (INDEPENDENT_AMBULATORY_CARE_PROVIDER_SITE_OTHER): Payer: Medicare Other | Admitting: *Deleted

## 2012-05-05 DIAGNOSIS — Z7901 Long term (current) use of anticoagulants: Secondary | ICD-10-CM

## 2012-05-05 DIAGNOSIS — I4891 Unspecified atrial fibrillation: Secondary | ICD-10-CM

## 2012-05-20 ENCOUNTER — Ambulatory Visit (INDEPENDENT_AMBULATORY_CARE_PROVIDER_SITE_OTHER): Payer: Medicare Other | Admitting: *Deleted

## 2012-05-20 DIAGNOSIS — I4891 Unspecified atrial fibrillation: Secondary | ICD-10-CM

## 2012-05-20 DIAGNOSIS — Z7901 Long term (current) use of anticoagulants: Secondary | ICD-10-CM

## 2012-05-28 DIAGNOSIS — J309 Allergic rhinitis, unspecified: Secondary | ICD-10-CM | POA: Diagnosis not present

## 2012-05-28 DIAGNOSIS — I498 Other specified cardiac arrhythmias: Secondary | ICD-10-CM | POA: Diagnosis not present

## 2012-05-28 DIAGNOSIS — J329 Chronic sinusitis, unspecified: Secondary | ICD-10-CM | POA: Diagnosis not present

## 2012-06-03 ENCOUNTER — Ambulatory Visit (INDEPENDENT_AMBULATORY_CARE_PROVIDER_SITE_OTHER): Payer: Medicare Other | Admitting: *Deleted

## 2012-06-03 DIAGNOSIS — Z7901 Long term (current) use of anticoagulants: Secondary | ICD-10-CM

## 2012-06-03 DIAGNOSIS — I4891 Unspecified atrial fibrillation: Secondary | ICD-10-CM

## 2012-06-24 ENCOUNTER — Ambulatory Visit (INDEPENDENT_AMBULATORY_CARE_PROVIDER_SITE_OTHER): Payer: Medicare Other | Admitting: *Deleted

## 2012-06-24 DIAGNOSIS — Z7901 Long term (current) use of anticoagulants: Secondary | ICD-10-CM

## 2012-06-24 DIAGNOSIS — I4891 Unspecified atrial fibrillation: Secondary | ICD-10-CM

## 2012-06-24 LAB — POCT INR: INR: 3

## 2012-07-22 ENCOUNTER — Ambulatory Visit (INDEPENDENT_AMBULATORY_CARE_PROVIDER_SITE_OTHER): Payer: Medicare Other | Admitting: *Deleted

## 2012-07-22 DIAGNOSIS — Z7901 Long term (current) use of anticoagulants: Secondary | ICD-10-CM

## 2012-07-22 DIAGNOSIS — I4891 Unspecified atrial fibrillation: Secondary | ICD-10-CM | POA: Diagnosis not present

## 2012-07-22 LAB — POCT INR: INR: 5.6

## 2012-07-29 ENCOUNTER — Ambulatory Visit (INDEPENDENT_AMBULATORY_CARE_PROVIDER_SITE_OTHER): Payer: Medicare Other | Admitting: *Deleted

## 2012-07-29 DIAGNOSIS — I4891 Unspecified atrial fibrillation: Secondary | ICD-10-CM | POA: Diagnosis not present

## 2012-07-29 DIAGNOSIS — Z7901 Long term (current) use of anticoagulants: Secondary | ICD-10-CM

## 2012-07-29 LAB — POCT INR: INR: 2

## 2012-08-19 ENCOUNTER — Ambulatory Visit (INDEPENDENT_AMBULATORY_CARE_PROVIDER_SITE_OTHER): Payer: Medicare Other | Admitting: *Deleted

## 2012-08-19 DIAGNOSIS — I4891 Unspecified atrial fibrillation: Secondary | ICD-10-CM | POA: Diagnosis not present

## 2012-08-19 DIAGNOSIS — Z7901 Long term (current) use of anticoagulants: Secondary | ICD-10-CM

## 2012-08-26 ENCOUNTER — Ambulatory Visit (INDEPENDENT_AMBULATORY_CARE_PROVIDER_SITE_OTHER): Payer: Medicare Other | Admitting: *Deleted

## 2012-08-26 DIAGNOSIS — Z7901 Long term (current) use of anticoagulants: Secondary | ICD-10-CM | POA: Diagnosis not present

## 2012-08-26 DIAGNOSIS — I4891 Unspecified atrial fibrillation: Secondary | ICD-10-CM | POA: Diagnosis not present

## 2012-08-26 LAB — POCT INR: INR: 1.8

## 2012-09-07 ENCOUNTER — Other Ambulatory Visit: Payer: Self-pay | Admitting: *Deleted

## 2012-09-07 DIAGNOSIS — M25552 Pain in left hip: Secondary | ICD-10-CM

## 2012-09-07 MED ORDER — GABAPENTIN 100 MG PO CAPS: 100.0000 mg | ORAL_CAPSULE | Freq: Two times a day (BID) | ORAL | Status: DC

## 2012-09-09 ENCOUNTER — Ambulatory Visit (INDEPENDENT_AMBULATORY_CARE_PROVIDER_SITE_OTHER): Payer: Medicare Other | Admitting: *Deleted

## 2012-09-09 DIAGNOSIS — I4891 Unspecified atrial fibrillation: Secondary | ICD-10-CM | POA: Diagnosis not present

## 2012-09-09 DIAGNOSIS — Z7901 Long term (current) use of anticoagulants: Secondary | ICD-10-CM | POA: Diagnosis not present

## 2012-09-14 DIAGNOSIS — E039 Hypothyroidism, unspecified: Secondary | ICD-10-CM | POA: Diagnosis not present

## 2012-09-14 DIAGNOSIS — I499 Cardiac arrhythmia, unspecified: Secondary | ICD-10-CM | POA: Diagnosis not present

## 2012-09-14 DIAGNOSIS — I10 Essential (primary) hypertension: Secondary | ICD-10-CM | POA: Diagnosis not present

## 2012-09-14 DIAGNOSIS — J410 Simple chronic bronchitis: Secondary | ICD-10-CM | POA: Diagnosis not present

## 2012-09-15 ENCOUNTER — Other Ambulatory Visit: Payer: Self-pay | Admitting: Cardiology

## 2012-09-30 ENCOUNTER — Ambulatory Visit (INDEPENDENT_AMBULATORY_CARE_PROVIDER_SITE_OTHER): Payer: Medicare Other | Admitting: *Deleted

## 2012-09-30 DIAGNOSIS — Z7901 Long term (current) use of anticoagulants: Secondary | ICD-10-CM | POA: Diagnosis not present

## 2012-09-30 DIAGNOSIS — I4891 Unspecified atrial fibrillation: Secondary | ICD-10-CM

## 2012-10-28 ENCOUNTER — Ambulatory Visit (INDEPENDENT_AMBULATORY_CARE_PROVIDER_SITE_OTHER): Payer: Medicare Other | Admitting: *Deleted

## 2012-10-28 DIAGNOSIS — Z7901 Long term (current) use of anticoagulants: Secondary | ICD-10-CM

## 2012-10-28 DIAGNOSIS — I4891 Unspecified atrial fibrillation: Secondary | ICD-10-CM | POA: Diagnosis not present

## 2012-10-28 DIAGNOSIS — Z23 Encounter for immunization: Secondary | ICD-10-CM | POA: Diagnosis not present

## 2012-11-26 DIAGNOSIS — Z23 Encounter for immunization: Secondary | ICD-10-CM | POA: Diagnosis not present

## 2012-12-02 ENCOUNTER — Ambulatory Visit (INDEPENDENT_AMBULATORY_CARE_PROVIDER_SITE_OTHER): Payer: Medicare Other | Admitting: *Deleted

## 2012-12-02 DIAGNOSIS — I4891 Unspecified atrial fibrillation: Secondary | ICD-10-CM | POA: Diagnosis not present

## 2012-12-02 DIAGNOSIS — Z7901 Long term (current) use of anticoagulants: Secondary | ICD-10-CM | POA: Diagnosis not present

## 2012-12-02 LAB — POCT INR: INR: 2.5

## 2012-12-14 DIAGNOSIS — I4891 Unspecified atrial fibrillation: Secondary | ICD-10-CM | POA: Diagnosis not present

## 2012-12-14 DIAGNOSIS — I10 Essential (primary) hypertension: Secondary | ICD-10-CM | POA: Diagnosis not present

## 2013-01-13 ENCOUNTER — Ambulatory Visit (INDEPENDENT_AMBULATORY_CARE_PROVIDER_SITE_OTHER): Payer: Medicare Other | Admitting: *Deleted

## 2013-01-13 DIAGNOSIS — I4891 Unspecified atrial fibrillation: Secondary | ICD-10-CM | POA: Diagnosis not present

## 2013-01-13 DIAGNOSIS — Z7901 Long term (current) use of anticoagulants: Secondary | ICD-10-CM | POA: Diagnosis not present

## 2013-01-13 LAB — POCT INR: INR: 2.5

## 2013-01-31 ENCOUNTER — Other Ambulatory Visit: Payer: Self-pay | Admitting: Cardiology

## 2013-02-24 ENCOUNTER — Ambulatory Visit (INDEPENDENT_AMBULATORY_CARE_PROVIDER_SITE_OTHER): Payer: Medicare Other | Admitting: *Deleted

## 2013-02-24 DIAGNOSIS — Z7901 Long term (current) use of anticoagulants: Secondary | ICD-10-CM

## 2013-02-24 DIAGNOSIS — I482 Chronic atrial fibrillation, unspecified: Secondary | ICD-10-CM

## 2013-02-24 DIAGNOSIS — Z5181 Encounter for therapeutic drug level monitoring: Secondary | ICD-10-CM

## 2013-02-24 DIAGNOSIS — I4891 Unspecified atrial fibrillation: Secondary | ICD-10-CM | POA: Diagnosis not present

## 2013-02-24 LAB — POCT INR: INR: 1.8

## 2013-03-07 ENCOUNTER — Other Ambulatory Visit: Payer: Self-pay | Admitting: *Deleted

## 2013-03-07 DIAGNOSIS — M25552 Pain in left hip: Secondary | ICD-10-CM

## 2013-03-07 MED ORDER — GABAPENTIN 100 MG PO CAPS: 100.0000 mg | ORAL_CAPSULE | Freq: Two times a day (BID) | ORAL | Status: DC

## 2013-03-23 ENCOUNTER — Ambulatory Visit (INDEPENDENT_AMBULATORY_CARE_PROVIDER_SITE_OTHER): Payer: Medicare Other | Admitting: *Deleted

## 2013-03-23 DIAGNOSIS — Z7901 Long term (current) use of anticoagulants: Secondary | ICD-10-CM | POA: Diagnosis not present

## 2013-03-23 DIAGNOSIS — Z5181 Encounter for therapeutic drug level monitoring: Secondary | ICD-10-CM

## 2013-03-23 DIAGNOSIS — I4891 Unspecified atrial fibrillation: Secondary | ICD-10-CM

## 2013-03-23 DIAGNOSIS — I482 Chronic atrial fibrillation, unspecified: Secondary | ICD-10-CM

## 2013-03-23 LAB — POCT INR: INR: 2.4

## 2013-04-07 DIAGNOSIS — I10 Essential (primary) hypertension: Secondary | ICD-10-CM | POA: Diagnosis not present

## 2013-04-07 DIAGNOSIS — F410 Panic disorder [episodic paroxysmal anxiety] without agoraphobia: Secondary | ICD-10-CM | POA: Diagnosis not present

## 2013-04-07 DIAGNOSIS — I4891 Unspecified atrial fibrillation: Secondary | ICD-10-CM | POA: Diagnosis not present

## 2013-04-20 ENCOUNTER — Ambulatory Visit (INDEPENDENT_AMBULATORY_CARE_PROVIDER_SITE_OTHER): Payer: Medicare Other | Admitting: *Deleted

## 2013-04-20 DIAGNOSIS — I482 Chronic atrial fibrillation, unspecified: Secondary | ICD-10-CM

## 2013-04-20 DIAGNOSIS — I4891 Unspecified atrial fibrillation: Secondary | ICD-10-CM

## 2013-04-20 DIAGNOSIS — Z7901 Long term (current) use of anticoagulants: Secondary | ICD-10-CM

## 2013-04-20 DIAGNOSIS — Z5181 Encounter for therapeutic drug level monitoring: Secondary | ICD-10-CM

## 2013-04-20 LAB — POCT INR: INR: 2.3

## 2013-05-19 ENCOUNTER — Encounter: Payer: Self-pay | Admitting: Cardiovascular Disease

## 2013-05-19 ENCOUNTER — Ambulatory Visit (INDEPENDENT_AMBULATORY_CARE_PROVIDER_SITE_OTHER): Payer: Medicare Other | Admitting: Cardiovascular Disease

## 2013-05-19 ENCOUNTER — Ambulatory Visit (INDEPENDENT_AMBULATORY_CARE_PROVIDER_SITE_OTHER): Payer: Medicare Other | Admitting: *Deleted

## 2013-05-19 VITALS — BP 147/83 | HR 83 | Ht 60.0 in | Wt 119.0 lb

## 2013-05-19 DIAGNOSIS — F17200 Nicotine dependence, unspecified, uncomplicated: Secondary | ICD-10-CM

## 2013-05-19 DIAGNOSIS — Z5181 Encounter for therapeutic drug level monitoring: Secondary | ICD-10-CM

## 2013-05-19 DIAGNOSIS — I482 Chronic atrial fibrillation, unspecified: Secondary | ICD-10-CM

## 2013-05-19 DIAGNOSIS — I4891 Unspecified atrial fibrillation: Secondary | ICD-10-CM

## 2013-05-19 DIAGNOSIS — Z7901 Long term (current) use of anticoagulants: Secondary | ICD-10-CM | POA: Diagnosis not present

## 2013-05-19 DIAGNOSIS — I10 Essential (primary) hypertension: Secondary | ICD-10-CM

## 2013-05-19 DIAGNOSIS — Z720 Tobacco use: Secondary | ICD-10-CM

## 2013-05-19 LAB — POCT INR: INR: 2.3

## 2013-05-19 NOTE — Patient Instructions (Signed)
Your physician wants you to follow-up in: 1 year You will receive a reminder letter in the mail two months in advance. If you don't receive a letter, please call our office to schedule the follow-up appointment.    Your physician recommends that you continue on your current medications as directed. Please refer to the Current Medication list given to you today.     Thank you for choosing [facility] !  

## 2013-05-19 NOTE — Progress Notes (Signed)
Patient ID: LEXINGTON DEVINE, female   DOB: 10-25-1940, 73 y.o.   MRN: [id number]      SUBJECTIVE: The patient is a 73 year old woman with a history of permanent atrial fibrillation and hypertension. She is doing well, and stays very active by doing gardening, canning and selling vegetables, doing housework, as well as taking care of her grandchildren. She denies chest pain, shortness of breath, palpitations, lightheadedness and leg swelling. She may smoke up to a half pack per day.  Her sister is Ignacia Marvel, who is also my patient.    Allergies  Allergen Reactions  . Codeine   . Diphenhydramine Hcl   . Estrogens Other (See Comments)    edema  . Nicoderm [Nicotine] Dermatitis    Transdermal nicotine delivery systems  . Penicillins     Current Outpatient Prescriptions  Medication Sig Dispense Refill  . ALPRAZolam (XANAX) 0.5 MG tablet Take 0.5 mg by mouth 3 (three) times daily.       . cetirizine (ZYRTEC) 10 MG tablet Take 10 mg by mouth daily.        . citalopram (CELEXA) 20 MG tablet Take 20 mg by mouth daily.      . digoxin (LANOXIN) 0.125 MG tablet Take 125 mcg by mouth daily.       [name] DILTIAZEM HCL ER BEADS PO Take 120 mg by mouth daily.       [name] gabapentin (NEURONTIN) 100 MG capsule Take 1 capsule (100 mg total) by mouth 2 (two) times daily.  60 capsule  5  . metoprolol (TOPROL-XL) 25 MG 24 hr tablet Take 12.5 mg by mouth 2 (two) times daily.       . PredniSONE (STERAPRED 12 DAY) 5 MG KIT Take by mouth. 1 by mouth as directed       . traMADol-acetaminophen (ULTRACET) 37.5-325 MG per tablet Take 1 tablet by mouth every 6 (six) hours as needed.        . warfarin (COUMADIN) 5 MG tablet TAKE ONE (1) TABLET EACH DAY  30 tablet  3   No current facility-administered medications for this visit.    Past Medical History  Diagnosis Date  . Atrial fibrillation, chronic 2004    left atrial thrombus in 2004; near syncope in 2008; echocardiogram in 2008-normal EF, mild left atrial  enlargement, no valvular abnormalities, lipomatous hypertrophy; no aortic atheroma on TEE in 2004  . Chronic anticoagulation 2004    warfarin  . Tobacco abuse     40 pack years; quit attempt in 2012  . DJD (degenerative joint disease), lumbosacral     Also hip  . Peptic ulcer disease     s/p Billroth II  . Depression   . Fracture of wrist     Left  . Hypertension     Normal carotid ultrasound in 2008    Past Surgical History  Procedure Laterality Date  . Cholecystectomy  2004    Jefferson II; splenectomy; incidental appendectomy  . Abdominal hysterectomy      w/o oophorectomy  . Cesarean section    . Tonsillectomy    . Colonoscopy  1980s    patient denies ever having undergone colonoscopy as of 2014    History   Social History  . Marital Status: Married    Spouse Name: N/A    Number of Children: 2  . Years of Education: N/A   Occupational History  . Housecleaning    Social  History Main Topics  . Smoking status: Smoker, Current Status Unknown -- 0.80 packs/day for 55 years  . Smokeless tobacco: Not on file     Comment: Quit attempt in 2012  . Alcohol Use: No  . Drug Use: No  . Sexual Activity: Not on file   Other Topics Concern  . Not on file   Social History Narrative   Married with one child and one adopted child, 1 stillbirth, and one deceased child at age 21 due to illness   No regular exercise     Filed Vitals:   05/19/13 0843  BP: 147/83  Pulse: 83  Height: 5' (1.524 m)  Weight: 119 lb (53.978 kg)    PHYSICAL EXAM General: NAD Neck: No JVD, no thyromegaly. Lungs: Clear to auscultation bilaterally with normal respiratory effort. CV: Nondisplaced PMI.  Irregulary rhythm, normal rate, normal S1/S2, no S3, no murmur. No pretibial or periankle edema.  No carotid bruit.  Abdomen: Soft, nontender, no hepatosplenomegaly, no distention.  Neurologic: Alert and oriented x 3.  Psych: Normal  affect. Extremities: No clubbing or cyanosis.   ECG: reviewed and available in electronic records. (atrial fibrillation, HR 99 bpm, diffuse nonspecific ST-T abnormality)      ASSESSMENT AND PLAN: 1. Permanent atrial fibrillation: Rate controlled, asymptomatic, and tolerating warfarin. 2. Hypertension: Elevated today as patient rushed to get to appointment and grandson lost the keys to her car. Will simply monitor for now. 3. Tobacco abuse: Cessation counseling given.  Dispo: f/u 1 year.  Kate Sable, M.D., F.A.C.C.

## 2013-06-21 ENCOUNTER — Other Ambulatory Visit: Payer: Self-pay | Admitting: Adult Health

## 2013-06-22 ENCOUNTER — Telehealth: Payer: Self-pay | Admitting: Cardiovascular Disease

## 2013-06-22 DIAGNOSIS — I10 Essential (primary) hypertension: Secondary | ICD-10-CM

## 2013-06-22 NOTE — Telephone Encounter (Signed)
Patient would like to speak with nurse regarding have labs drawn / tgs

## 2013-06-23 ENCOUNTER — Ambulatory Visit (INDEPENDENT_AMBULATORY_CARE_PROVIDER_SITE_OTHER): Payer: Medicare Other | Admitting: *Deleted

## 2013-06-23 DIAGNOSIS — Z5181 Encounter for therapeutic drug level monitoring: Secondary | ICD-10-CM

## 2013-06-23 DIAGNOSIS — I482 Chronic atrial fibrillation, unspecified: Secondary | ICD-10-CM

## 2013-06-23 DIAGNOSIS — Z7901 Long term (current) use of anticoagulants: Secondary | ICD-10-CM

## 2013-06-23 DIAGNOSIS — I4891 Unspecified atrial fibrillation: Secondary | ICD-10-CM | POA: Diagnosis not present

## 2013-06-23 LAB — POCT INR: INR: 3

## 2013-06-24 NOTE — Telephone Encounter (Signed)
Pt would like to have blood work ordered to check her cholesterol and any other teste you deem fit

## 2013-06-27 NOTE — Telephone Encounter (Signed)
Labs ordered pt made aware

## 2013-06-27 NOTE — Telephone Encounter (Signed)
Check BMET to assess renal function, and fasting lipid profile if not performed by PCP.

## 2013-06-30 DIAGNOSIS — I10 Essential (primary) hypertension: Secondary | ICD-10-CM | POA: Diagnosis not present

## 2013-06-30 LAB — COMPREHENSIVE METABOLIC PANEL
ALK PHOS: 72 U/L (ref 39–117)
ALT: 16 U/L (ref 0–35)
AST: 25 U/L (ref 0–37)
Albumin: 4 g/dL (ref 3.5–5.2)
BILIRUBIN TOTAL: 0.4 mg/dL (ref 0.2–1.2)
BUN: 11 mg/dL (ref 6–23)
CO2: 29 mEq/L (ref 19–32)
Calcium: 8.9 mg/dL (ref 8.4–10.5)
Chloride: 100 mEq/L (ref 96–112)
Creat: 0.65 mg/dL (ref 0.50–1.10)
Glucose, Bld: 101 mg/dL — ABNORMAL HIGH (ref 70–99)
Potassium: 4.1 mEq/L (ref 3.5–5.3)
Sodium: 138 mEq/L (ref 135–145)
Total Protein: 7.1 g/dL (ref 6.0–8.3)

## 2013-06-30 LAB — LIPID PANEL
CHOL/HDL RATIO: 4.8 ratio
Cholesterol: 179 mg/dL (ref 0–200)
HDL: 37 mg/dL — AB (ref >39)
LDL Cholesterol: 107 mg/dL — ABNORMAL HIGH (ref 0–99)
Triglycerides: 173 mg/dL — ABNORMAL HIGH (ref <150)
VLDL: 35 mg/dL (ref 0–40)

## 2013-07-01 ENCOUNTER — Telehealth: Payer: Self-pay

## 2013-07-01 NOTE — Telephone Encounter (Signed)
Message copied by Bernita Raisin on Fri Jul 01, 2013 10:56 AM ------      Message from: Kate Sable A      Created: Fri Jul 01, 2013  9:41 AM       Ok. ------

## 2013-07-01 NOTE — Telephone Encounter (Signed)
Lm on pts personal vm that labs are ok per Dr.koneswaran,copy to pcp and mailed copy labs to pt

## 2013-07-07 DIAGNOSIS — I4891 Unspecified atrial fibrillation: Secondary | ICD-10-CM | POA: Diagnosis not present

## 2013-07-07 DIAGNOSIS — I10 Essential (primary) hypertension: Secondary | ICD-10-CM | POA: Diagnosis not present

## 2013-07-13 ENCOUNTER — Other Ambulatory Visit (HOSPITAL_COMMUNITY): Payer: Self-pay | Admitting: General Surgery

## 2013-07-13 DIAGNOSIS — Z1231 Encounter for screening mammogram for malignant neoplasm of breast: Secondary | ICD-10-CM

## 2013-07-13 DIAGNOSIS — R599 Enlarged lymph nodes, unspecified: Secondary | ICD-10-CM | POA: Diagnosis not present

## 2013-07-13 DIAGNOSIS — Z139 Encounter for screening, unspecified: Secondary | ICD-10-CM

## 2013-07-13 DIAGNOSIS — R220 Localized swelling, mass and lump, head: Secondary | ICD-10-CM | POA: Diagnosis not present

## 2013-07-15 ENCOUNTER — Ambulatory Visit (HOSPITAL_COMMUNITY)
Admission: RE | Admit: 2013-07-15 | Discharge: 2013-07-15 | Disposition: A | Discharge status: Home / Self Care | Payer: Medicare Other | Source: Ambulatory Visit | Attending: General Surgery | Admitting: General Surgery

## 2013-07-15 DIAGNOSIS — Z1231 Encounter for screening mammogram for malignant neoplasm of breast: Secondary | ICD-10-CM | POA: Diagnosis not present

## 2013-07-20 DIAGNOSIS — M79609 Pain in unspecified limb: Secondary | ICD-10-CM | POA: Diagnosis not present

## 2013-07-20 DIAGNOSIS — M2010 Hallux valgus (acquired), unspecified foot: Secondary | ICD-10-CM | POA: Diagnosis not present

## 2013-08-03 ENCOUNTER — Other Ambulatory Visit (HOSPITAL_COMMUNITY): Payer: Self-pay | Admitting: General Surgery

## 2013-08-03 DIAGNOSIS — R599 Enlarged lymph nodes, unspecified: Secondary | ICD-10-CM | POA: Diagnosis not present

## 2013-08-03 DIAGNOSIS — R220 Localized swelling, mass and lump, head: Secondary | ICD-10-CM | POA: Diagnosis not present

## 2013-08-03 DIAGNOSIS — R221 Localized swelling, mass and lump, neck: Secondary | ICD-10-CM | POA: Diagnosis not present

## 2013-08-04 ENCOUNTER — Ambulatory Visit (INDEPENDENT_AMBULATORY_CARE_PROVIDER_SITE_OTHER): Payer: Medicare Other | Admitting: *Deleted

## 2013-08-04 DIAGNOSIS — I482 Chronic atrial fibrillation, unspecified: Secondary | ICD-10-CM

## 2013-08-04 DIAGNOSIS — I4891 Unspecified atrial fibrillation: Secondary | ICD-10-CM

## 2013-08-04 DIAGNOSIS — Z7901 Long term (current) use of anticoagulants: Secondary | ICD-10-CM | POA: Diagnosis not present

## 2013-08-04 DIAGNOSIS — Z5181 Encounter for therapeutic drug level monitoring: Secondary | ICD-10-CM

## 2013-08-04 LAB — POCT INR: INR: 3.1

## 2013-08-05 ENCOUNTER — Encounter (HOSPITAL_COMMUNITY): Payer: Self-pay

## 2013-08-05 ENCOUNTER — Ambulatory Visit (HOSPITAL_COMMUNITY)
Admission: RE | Admit: 2013-08-05 | Discharge: 2013-08-05 | Disposition: A | Discharge status: Home / Self Care | Payer: Medicare Other | Source: Ambulatory Visit | Attending: General Surgery | Admitting: General Surgery

## 2013-08-05 DIAGNOSIS — R599 Enlarged lymph nodes, unspecified: Secondary | ICD-10-CM | POA: Insufficient documentation

## 2013-08-05 DIAGNOSIS — R220 Localized swelling, mass and lump, head: Secondary | ICD-10-CM | POA: Insufficient documentation

## 2013-08-05 DIAGNOSIS — R221 Localized swelling, mass and lump, neck: Secondary | ICD-10-CM

## 2013-08-05 MED ORDER — IOHEXOL 300 MG/ML  SOLN
80.0000 mL | Freq: Once | INTRAMUSCULAR | Status: AC | PRN
  Administered 2013-08-05: 80 mL via INTRAVENOUS

## 2013-08-08 ENCOUNTER — Telehealth: Payer: Self-pay | Admitting: *Deleted

## 2013-08-08 DIAGNOSIS — R599 Enlarged lymph nodes, unspecified: Secondary | ICD-10-CM | POA: Diagnosis not present

## 2013-08-08 DIAGNOSIS — R221 Localized swelling, mass and lump, neck: Secondary | ICD-10-CM | POA: Diagnosis not present

## 2013-08-08 DIAGNOSIS — R220 Localized swelling, mass and lump, head: Secondary | ICD-10-CM | POA: Diagnosis not present

## 2013-08-08 NOTE — Telephone Encounter (Signed)
No need for presurgical cardiac testing. Can hold warfarin 4-5 days prior to biopsy and resume thereafter.

## 2013-08-08 NOTE — Telephone Encounter (Signed)
Will forward to Dr. Koneswaran  

## 2013-08-08 NOTE — Telephone Encounter (Signed)
Spoke with pt. She does not know when the Biopsy is. Will forward to Dr Romona Curls.

## 2013-08-08 NOTE — Telephone Encounter (Signed)
NEED SURGICAL CLEARANCE FOR CERVICAL LYMPHOIDS BYOPSY. PT WAS LAST SEEN IN April AND IS ON COUMADIN

## 2013-08-18 ENCOUNTER — Ambulatory Visit (INDEPENDENT_AMBULATORY_CARE_PROVIDER_SITE_OTHER): Payer: Medicare Other | Admitting: *Deleted

## 2013-08-18 DIAGNOSIS — Z7901 Long term (current) use of anticoagulants: Secondary | ICD-10-CM | POA: Diagnosis not present

## 2013-08-18 DIAGNOSIS — I4891 Unspecified atrial fibrillation: Secondary | ICD-10-CM

## 2013-08-18 DIAGNOSIS — Z5181 Encounter for therapeutic drug level monitoring: Secondary | ICD-10-CM | POA: Diagnosis not present

## 2013-08-18 DIAGNOSIS — I482 Chronic atrial fibrillation, unspecified: Secondary | ICD-10-CM

## 2013-08-18 LAB — POCT INR: INR: 2.3

## 2013-08-31 ENCOUNTER — Encounter: Payer: Self-pay | Admitting: *Deleted

## 2013-09-09 ENCOUNTER — Encounter (HOSPITAL_COMMUNITY)
Admission: RE | Admit: 2013-09-09 | Discharge: 2013-09-09 | Disposition: A | Discharge status: Home / Self Care | Payer: Medicare Other | Source: Ambulatory Visit | Attending: General Surgery | Admitting: General Surgery

## 2013-09-09 ENCOUNTER — Encounter (HOSPITAL_COMMUNITY): Payer: Self-pay | Admitting: Pharmacy Technician

## 2013-09-09 ENCOUNTER — Encounter (HOSPITAL_COMMUNITY): Payer: Self-pay

## 2013-09-09 DIAGNOSIS — I10 Essential (primary) hypertension: Secondary | ICD-10-CM | POA: Insufficient documentation

## 2013-09-09 DIAGNOSIS — I4891 Unspecified atrial fibrillation: Secondary | ICD-10-CM | POA: Insufficient documentation

## 2013-09-09 DIAGNOSIS — Z0189 Encounter for other specified special examinations: Secondary | ICD-10-CM | POA: Diagnosis not present

## 2013-09-09 DIAGNOSIS — Z01812 Encounter for preprocedural laboratory examination: Secondary | ICD-10-CM | POA: Diagnosis not present

## 2013-09-09 DIAGNOSIS — Z01818 Encounter for other preprocedural examination: Secondary | ICD-10-CM | POA: Diagnosis not present

## 2013-09-09 DIAGNOSIS — F411 Generalized anxiety disorder: Secondary | ICD-10-CM | POA: Insufficient documentation

## 2013-09-09 DIAGNOSIS — R599 Enlarged lymph nodes, unspecified: Secondary | ICD-10-CM | POA: Diagnosis not present

## 2013-09-09 DIAGNOSIS — F17200 Nicotine dependence, unspecified, uncomplicated: Secondary | ICD-10-CM | POA: Diagnosis not present

## 2013-09-09 HISTORY — DX: Cardiac arrhythmia, unspecified: I49.9

## 2013-09-09 HISTORY — DX: Other specified postprocedural states: R11.2

## 2013-09-09 HISTORY — DX: Other specified postprocedural states: Z98.890

## 2013-09-09 LAB — CBC
HCT: 37.8 % (ref 36.0–46.0)
Hemoglobin: 12 g/dL (ref 12.0–15.0)
MCH: 25.7 pg — AB (ref 26.0–34.0)
MCHC: 31.7 g/dL (ref 30.0–36.0)
MCV: 80.9 fL (ref 78.0–100.0)
PLATELETS: 443 10*3/uL — AB (ref 150–400)
RBC: 4.67 MIL/uL (ref 3.87–5.11)
RDW: 16.4 % — AB (ref 11.5–15.5)
WBC: 12.4 10*3/uL — ABNORMAL HIGH (ref 4.0–10.5)

## 2013-09-09 LAB — BASIC METABOLIC PANEL
Anion gap: 14 (ref 5–15)
BUN: 6 mg/dL (ref 6–23)
CALCIUM: 8.7 mg/dL (ref 8.4–10.5)
CO2: 27 mEq/L (ref 19–32)
CREATININE: 0.62 mg/dL (ref 0.50–1.10)
Chloride: 100 mEq/L (ref 96–112)
GFR, EST NON AFRICAN AMERICAN: 88 mL/min — AB (ref >90)
Glucose, Bld: 115 mg/dL — ABNORMAL HIGH (ref 70–99)
Potassium: 4.7 mEq/L (ref 3.7–5.3)
Sodium: 141 mEq/L (ref 137–147)

## 2013-09-09 NOTE — Patient Instructions (Signed)
Marissa Turner  09/09/2013   Your procedure is scheduled on:  09/15/2013  Report to [facility] at  900  AM.  Call this number if you have problems the morning of surgery: [phone]   Remember:   Do not eat food or drink liquids after midnight.   Take these medicines the morning of surgery with A SIP OF WATER: xanax, zyrtec, celexa, digoxin, neurontin, metoprolol, tramdol   Do not wear jewelry, make-up or nail polish.  Do not wear lotions, powders, or perfumes.   Do not shave 48 hours prior to surgery. Men may shave face and neck.  Do not bring valuables to the hospital.  [facility] is not responsible for any belongings or valuables.               Contacts, dentures or bridgework may not be worn into surgery.  Leave suitcase in the car. After surgery it may be brought to your room.  For patients admitted to the hospital, discharge time is determined by your treatment team.               Patients discharged the day of surgery will not be allowed to drive home.  Name and phone number of your driver: family Special Instructions: Shower using CHG 2 nights before surgery and the night before surgery.  If you shower the day of surgery use CHG.  Use special wash - you have one bottle of CHG for all showers.  You should use approximately 1/3 of the bottle for each shower.   Please read over the following fact sheets that you were given: Pain Booklet, Coughing and Deep Breathing, Surgical Site Infection Prevention, Anesthesia Post-op Instructions and Care and Recovery After Surgery Swollen Lymph Nodes The lymphatic system filters fluid from around cells. It is like a system of blood vessels. These channels carry lymph instead of blood. The lymphatic system is an important part of the immune (disease fighting) system. When people talk about "swollen glands in the neck," they are usually talking about swollen lymph nodes. The lymph nodes are like the little traps for infection. You and your  caregiver may be able to feel lymph nodes, especially swollen nodes, in these common areas: the groin (inguinal area), armpits (axilla), and above the clavicle (supraclavicular). You may also feel them in the neck (cervical) and the back of the head just above the hairline (occipital). Swollen glands occur when there is any condition in which the body responds with an allergic type of reaction. For instance, the glands in the neck can become swollen from insect bites or any type of minor infection on the head. These are very noticeable in children with only minor problems. Lymph nodes may also become swollen when there is a tumor or problem with the lymphatic system, such as Hodgkin's disease. TREATMENT   Most swollen glands do not require treatment. They can be observed (watched) for a short period of time, if your caregiver feels it is necessary. Most of the time, observation is not necessary.  Antibiotics (medicines that kill germs) may be prescribed by your caregiver. Your caregiver may prescribe these if he or she feels the swollen glands are due to a bacterial (germ) infection. Antibiotics are not used if the swollen glands are caused by a virus. HOME CARE INSTRUCTIONS   Take medications as directed by your caregiver. Only take over-the-counter or prescription medicines for pain, discomfort, or fever as directed by your caregiver. [place]  CARE IF:   If you begin to run a temperature greater than 102 F (38.9 C), or as your caregiver suggests. MAKE SURE YOU:   Understand these instructions.  Will watch your condition.  Will get help right away if you are not doing well or get worse. Document Released: 01/03/2002 Document Revised: 04/07/2011 Document Reviewed: 01/13/2005 [facility] Patient Information 2015 [place], [place]. This information is not intended to replace advice given to you by your health care provider. Make sure you discuss any questions you have with your health care  provider. PATIENT INSTRUCTIONS POST-ANESTHESIA  IMMEDIATELY FOLLOWING SURGERY:  Do not drive or operate machinery for the first twenty four hours after surgery.  Do not make any important decisions for twenty four hours after surgery or while taking narcotic pain medications or sedatives.  If you develop intractable nausea and vomiting or a severe headache please notify your doctor immediately.  FOLLOW-UP:  Please make an appointment with your surgeon as instructed. You do not need to follow up with anesthesia unless specifically instructed to do so.  WOUND CARE INSTRUCTIONS (if applicable):  Keep a dry clean dressing on the anesthesia/puncture wound site if there is drainage.  Once the wound has quit draining you may leave it open to air.  Generally you should leave the bandage intact for twenty four hours unless there is drainage.  If the epidural site drains for more than 36-48 hours please call the anesthesia department.  QUESTIONS?:  Please feel free to call your physician or the hospital operator if you have any questions, and they will be happy to assist you.

## 2013-09-09 NOTE — Pre-Procedure Instructions (Signed)
Patient given information to sign up for my chart at home. 

## 2013-09-10 NOTE — H&P (Signed)
NAME:  Marissa Turner, Marissa Turner NO.:  [id number]  MEDICAL RECORD NO.:  [id number]  LOCATION:  DOIB                          FACILITY:  [facility]  PHYSICIAN:  Felicie Morn, M.D. DATE OF BIRTH:  [birth year]  DATE OF ADMISSION:  09/09/2013 DATE OF DISCHARGE:  LH                             HISTORY & PHYSICAL   This is a 73 year old white female who has had for several months now and then large a lymph node in her left neck.  This has increased in size.  This has not been associated with any fever or chills or night sweats or any weight loss or rashes.  She has had some fatigue.  We have treated this expectantly with observation, however this has not diminished in size, it is actually increased in size.  We have planned for biopsy of this via the outpatient department.  We will do this to send this as a fresh specimen to rule out any lymphoma.  PAST MEDICAL HISTORY:  Positive for atrial fibrillation, hypertension, and anxiety.  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  She also underwent bilateral breast biopsies for benign disease.  MEDICATIONS:  For medications, see medication list.  ALLERGIES:  She is allergic to PENICILLIN and KEFLEX.  SOCIAL HISTORY:  She has been a heavy smoker in the past.  She states that she is smoking only 1-2 cigarettes a day.  At present, she does not abuse alcohol.  PHYSICAL EXAMINATION:  GENERAL:  She is in no acute distress. VITAL SIGNS:  She is 5 feet tall, weighs 116 pounds, temperature is 98.2, pulse is irregular and 80 per minute, respirations 14, blood pressure 124/80. HEENT:  Head is normocephalic.  Eyes, extraocular movements are intact. Pupils are round and reactive to light and accommodation.  There is no conjunctival pallor or scleral  injection.  Sclera has a normal tincture. Nose and oral mucosa are within normal limits.  The patient has a dental prosthesis superiorly and inferiorly.  There are no bruits appreciated. NECK:  She has a large left cervical lymph node just along the anterior border of the sternocleidomastoid muscle on the left neck.  There are other smaller shotty lymph nodes, but none is impressive as this one. CHEST:  There are no axillary lymph nodes appreciated. HEART:  Irregular.  She is in atrial fib. LUNGS:  She has some scattered rhonchi and not unusual for her, heavy smoking. BREAST:  Her breasts and axillae are without masses. ABDOMEN:  Soft, that she has some well-healed incision for gallbladder which I neglected to mention above.  She had a cholecystectomy and she also has a well-healed midline incision.  RECTAL AND PELVIC EXAMINATION: Deferred. EXTREMITIES:  The patient has chronic leg pain and back pain.  She walks with a limp.  REVIEW OF SYSTEMS:  NEUROLOGIC:  No history of migraines or seizures or any other neurological findings.  ENDOCRINE SYSTEM:  No history of diabetes, thyroid disease, or adrenal problems.  CARDIOPULMONARY SYSTEM: The patient has abused tobacco.  She  has atrial fib and has been cleared for surgery by the Cardiology Service and we have made plans accordingly regarding her treatment with Coumadin.  She has a history of hypertension.  MUSCULOSKELETAL SYSTEM:  The patient has chronic leg pain,  back pain, walks with a limp.  OB/GYN HISTORY:  Gravida 4, para 3, cesarean 1, abortus 0 female.  She has a positive family history of breast carcinoma.  She had underwent a fine-needle aspiration in [place] in 2003, this was benign.  Her last mammogram was 2014 and her last menstrual period was in 1982. GI SYSTEM:  No history of hepatitis, no problems with constipation, diarrhea, or bright red rectal bleeding.  No history of melena, or history of inflammatory bowel disease, or  irritable bowel syndrome.  She has had an antrectomy and vagotomy for benign ulcer disease in the past. 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  Unexplained weight loss.  The patient had a colonoscopy in 2014. GU SYSTEM:  No history of frequency, dysuria, or kidney stones.  REVIEW OF HISTORY AND PHYSICAL:  Therefore, Ms. Parrilla has been followed with enlarged lymph node and we will biopsy this via the outpatient department.  I will make sure that Dr. Legrand Rams gets a copy of the biopsy as well.  This will be treated as possible lymphoma regarding the specimen handling.     Felicie Morn, M.D.     WB/MEDQ  D:  09/09/2013  T:  09/10/2013  Job:  [id number]  cc:   Brandon Melnick D. Legrand Rams, MD Fax: [phone]

## 2013-09-15 ENCOUNTER — Ambulatory Visit (HOSPITAL_COMMUNITY): Payer: Medicare Other | Admitting: Anesthesiology

## 2013-09-15 ENCOUNTER — Encounter (HOSPITAL_COMMUNITY)
Admission: RE | Disposition: A | Discharge status: Home / Self Care | Payer: Self-pay | Source: Ambulatory Visit | Attending: General Surgery

## 2013-09-15 ENCOUNTER — Encounter (HOSPITAL_COMMUNITY): Payer: Medicare Other | Admitting: Anesthesiology

## 2013-09-15 ENCOUNTER — Encounter (HOSPITAL_COMMUNITY): Payer: Self-pay | Admitting: *Deleted

## 2013-09-15 ENCOUNTER — Ambulatory Visit (HOSPITAL_COMMUNITY)
Admission: RE | Admit: 2013-09-15 | Discharge: 2013-09-15 | Disposition: A | Discharge status: Home / Self Care | Payer: Medicare Other | Source: Ambulatory Visit | Attending: General Surgery | Admitting: General Surgery

## 2013-09-15 DIAGNOSIS — F17200 Nicotine dependence, unspecified, uncomplicated: Secondary | ICD-10-CM | POA: Insufficient documentation

## 2013-09-15 DIAGNOSIS — R599 Enlarged lymph nodes, unspecified: Secondary | ICD-10-CM | POA: Diagnosis not present

## 2013-09-15 DIAGNOSIS — C8581 Other specified types of non-Hodgkin lymphoma, lymph nodes of head, face, and neck: Secondary | ICD-10-CM | POA: Insufficient documentation

## 2013-09-15 HISTORY — PX: LYMPH NODE BIOPSY: SHX201

## 2013-09-15 LAB — PROTIME-INR
INR: 1.01 (ref 0.00–1.49)
Prothrombin Time: 13.3 seconds (ref 11.6–15.2)

## 2013-09-15 SURGERY — LYMPH NODE BIOPSY
Anesthesia: Monitor Anesthesia Care | Site: Neck | Laterality: Left

## 2013-09-15 MED ORDER — LIDOCAINE HCL (PF) 1 % IJ SOLN
INTRAMUSCULAR | Status: AC
  Filled 2013-09-15: qty 5

## 2013-09-15 MED ORDER — FENTANYL CITRATE 0.05 MG/ML IJ SOLN
25.0000 ug | INTRAMUSCULAR | Status: AC
  Administered 2013-09-15 (×2): 25 ug via INTRAVENOUS
  Filled 2013-09-15: qty 2

## 2013-09-15 MED ORDER — PROPOFOL 10 MG/ML IV EMUL
INTRAVENOUS | Status: AC
  Filled 2013-09-15: qty 20

## 2013-09-15 MED ORDER — MIDAZOLAM HCL 5 MG/5ML IJ SOLN
INTRAMUSCULAR | Status: DC | PRN
  Administered 2013-09-15 (×2): 1 mg via INTRAVENOUS

## 2013-09-15 MED ORDER — FENTANYL CITRATE 0.05 MG/ML IJ SOLN
INTRAMUSCULAR | Status: AC
  Filled 2013-09-15: qty 2

## 2013-09-15 MED ORDER — PROPOFOL INFUSION 10 MG/ML OPTIME
INTRAVENOUS | Status: DC | PRN
  Administered 2013-09-15: 50 ug/kg/min via INTRAVENOUS
  Administered 2013-09-15: 14:00:00 via INTRAVENOUS

## 2013-09-15 MED ORDER — BACITRACIN-NEOMYCIN-POLYMYXIN 400-5-5000 EX OINT
TOPICAL_OINTMENT | CUTANEOUS | Status: DC | PRN
  Administered 2013-09-15: 1 via TOPICAL

## 2013-09-15 MED ORDER — CIPROFLOXACIN IN D5W 200 MG/100ML IV SOLN: 200.0000 mg | Freq: Two times a day (BID) | INTRAVENOUS | Status: DC

## 2013-09-15 MED ORDER — STERILE WATER FOR IRRIGATION IR SOLN
Status: DC | PRN
  Administered 2013-09-15 (×2): 1000 mL

## 2013-09-15 MED ORDER — HEMOSTATIC AGENTS (NO CHARGE) OPTIME
TOPICAL | Status: DC | PRN
  Administered 2013-09-15: 1 via TOPICAL

## 2013-09-15 MED ORDER — EPHEDRINE SULFATE 50 MG/ML IJ SOLN
INTRAMUSCULAR | Status: DC | PRN
  Administered 2013-09-15 (×3): 10 mg via INTRAVENOUS

## 2013-09-15 MED ORDER — LIDOCAINE HCL (CARDIAC) 10 MG/ML IV SOLN
INTRAVENOUS | Status: DC | PRN
  Administered 2013-09-15: 50 mg via INTRAVENOUS

## 2013-09-15 MED ORDER — PROPOFOL 10 MG/ML IV BOLUS
INTRAVENOUS | Status: DC | PRN
  Administered 2013-09-15: 35 mg via INTRAVENOUS

## 2013-09-15 MED ORDER — ONDANSETRON HCL 4 MG/2ML IJ SOLN
4.0000 mg | Freq: Once | INTRAMUSCULAR | Status: AC
  Administered 2013-09-15: 4 mg via INTRAVENOUS
  Filled 2013-09-15: qty 2

## 2013-09-15 MED ORDER — MIDAZOLAM HCL 2 MG/2ML IJ SOLN
INTRAMUSCULAR | Status: AC
  Filled 2013-09-15: qty 2

## 2013-09-15 MED ORDER — LIDOCAINE HCL (PF) 1 % IJ SOLN
INTRAMUSCULAR | Status: DC | PRN
  Administered 2013-09-15: 5 mL

## 2013-09-15 MED ORDER — LACTATED RINGERS IV SOLN
INTRAVENOUS | Status: DC | PRN
  Administered 2013-09-15 (×2): via INTRAVENOUS

## 2013-09-15 MED ORDER — LACTATED RINGERS IV SOLN
INTRAVENOUS | Status: DC
  Administered 2013-09-15: 10:00:00 via INTRAVENOUS

## 2013-09-15 MED ORDER — LIDOCAINE HCL (PF) 1 % IJ SOLN
INTRAMUSCULAR | Status: AC
  Filled 2013-09-15: qty 30

## 2013-09-15 MED ORDER — BACITRACIN-NEOMYCIN-POLYMYXIN 400-5-5000 EX OINT
TOPICAL_OINTMENT | CUTANEOUS | Status: AC
  Filled 2013-09-15: qty 1

## 2013-09-15 MED ORDER — FENTANYL CITRATE 0.05 MG/ML IJ SOLN
25.0000 ug | INTRAMUSCULAR | Status: DC | PRN
  Administered 2013-09-15: 25 ug via INTRAVENOUS

## 2013-09-15 MED ORDER — FENTANYL CITRATE 0.05 MG/ML IJ SOLN
INTRAMUSCULAR | Status: DC | PRN
  Administered 2013-09-15: 25 ug via INTRAVENOUS
  Administered 2013-09-15: 50 ug via INTRAVENOUS
  Administered 2013-09-15: 25 ug via INTRAVENOUS
  Administered 2013-09-15 (×2): 50 ug via INTRAVENOUS

## 2013-09-15 MED ORDER — SUCCINYLCHOLINE CHLORIDE 20 MG/ML IJ SOLN
INTRAMUSCULAR | Status: AC
  Filled 2013-09-15: qty 1

## 2013-09-15 MED ORDER — ONDANSETRON HCL 4 MG/2ML IJ SOLN
4.0000 mg | Freq: Once | INTRAMUSCULAR | Status: DC | PRN
Start: 2013-09-15 — End: 2013-09-15

## 2013-09-15 MED ORDER — MIDAZOLAM HCL 2 MG/2ML IJ SOLN
1.0000 mg | INTRAMUSCULAR | Status: DC | PRN
  Administered 2013-09-15 (×2): 2 mg via INTRAVENOUS
  Filled 2013-09-15 (×2): qty 2

## 2013-09-15 MED ORDER — CIPROFLOXACIN IN D5W 200 MG/100ML IV SOLN
INTRAVENOUS | Status: AC
  Filled 2013-09-15: qty 100

## 2013-09-15 MED ORDER — SODIUM CHLORIDE 0.9 % IR SOLN
Status: DC | PRN
  Administered 2013-09-15: 1000 mL

## 2013-09-15 SURGICAL SUPPLY — 51 items
APPLIER CLIP 11 MED OPEN (CLIP) ×6
APPLIER CLIP 9.375 SM OPEN (CLIP) ×9
ATTRACTOMAT 16X20 MAGNETIC DRP (DRAPES) ×3 IMPLANT
BAG HAMPER (MISCELLANEOUS) ×3 IMPLANT
BLADE SURG 15 STRL LF DISP TIS (BLADE) ×2 IMPLANT
BLADE SURG 15 STRL SS (BLADE) ×4
CLIP APPLIE 11 MED OPEN (CLIP) ×2 IMPLANT
CLIP APPLIE 9.375 SM OPEN (CLIP) ×3 IMPLANT
CLOTH BEACON ORANGE TIMEOUT ST (SAFETY) ×3 IMPLANT
COVER LIGHT HANDLE STERIS (MISCELLANEOUS) ×6 IMPLANT
DECANTER SPIKE VIAL GLASS SM (MISCELLANEOUS) ×3 IMPLANT
DRAPE PROXIMA HALF (DRAPES) ×3 IMPLANT
DRSG TEGADERM 2-3/8X2-3/4 SM (GAUZE/BANDAGES/DRESSINGS) ×9 IMPLANT
DURAPREP 6ML APPLICATOR 50/CS (WOUND CARE) ×3 IMPLANT
ELECT NEEDLE TIP 2.8 STRL (NEEDLE) ×3 IMPLANT
ELECT REM PT RETURN 9FT ADLT (ELECTROSURGICAL) ×3
ELECTRODE REM PT RTRN 9FT ADLT (ELECTROSURGICAL) ×1 IMPLANT
GLOVE BIOGEL PI IND STRL 7.0 (GLOVE) ×1 IMPLANT
GLOVE BIOGEL PI IND STRL 7.5 (GLOVE) ×1 IMPLANT
GLOVE BIOGEL PI INDICATOR 7.0 (GLOVE) ×2
GLOVE BIOGEL PI INDICATOR 7.5 (GLOVE) ×2
GLOVE ECLIPSE 6.5 STRL STRAW (GLOVE) ×6 IMPLANT
GLOVE EXAM NITRILE LRG STRL (GLOVE) ×3 IMPLANT
GLOVE SKINSENSE NS SZ7.0 (GLOVE) ×2
GLOVE SKINSENSE STRL SZ7.0 (GLOVE) ×1 IMPLANT
GLOVE SURG SS PI 7.5 STRL IVOR (GLOVE) ×3 IMPLANT
GOWN STRL REUS W/TWL LRG LVL3 (GOWN DISPOSABLE) ×6 IMPLANT
HEMOSTAT SURGICEL 4X8 (HEMOSTASIS) ×3 IMPLANT
INST SET MINOR GENERAL (KITS) ×3 IMPLANT
KIT ROOM TURNOVER APOR (KITS) ×3 IMPLANT
MANIFOLD NEPTUNE II (INSTRUMENTS) ×3 IMPLANT
MARKER SKIN DUAL TIP RULER LAB (MISCELLANEOUS) ×3 IMPLANT
NEEDLE HYPO 25X1 1.5 SAFETY (NEEDLE) ×3 IMPLANT
NS IRRIG 1000ML POUR BTL (IV SOLUTION) ×3 IMPLANT
PACK MINOR (CUSTOM PROCEDURE TRAY) ×3 IMPLANT
PAD ARMBOARD 7.5X6 YLW CONV (MISCELLANEOUS) ×3 IMPLANT
SET BASIN LINEN APH (SET/KITS/TRAYS/PACK) ×3 IMPLANT
SPONGE GAUZE 2X2 8PLY STER LF (GAUZE/BANDAGES/DRESSINGS) ×1
SPONGE GAUZE 2X2 8PLY STRL LF (GAUZE/BANDAGES/DRESSINGS) ×2 IMPLANT
SPONGE INTESTINAL PEANUT (DISPOSABLE) ×3 IMPLANT
SUT ETHILON 4 0 PS 2 18 (SUTURE) ×3 IMPLANT
SUT SILK 2 0 (SUTURE) ×2
SUT SILK 2-0 18XBRD TIE 12 (SUTURE) ×1 IMPLANT
SUT SILK 4 0 (SUTURE) ×2
SUT SILK 4-0 18XBRD TIE 12 (SUTURE) ×1 IMPLANT
SUT VIC AB 3-0 SH 27 (SUTURE) ×2
SUT VIC AB 3-0 SH 27X BRD (SUTURE) ×1 IMPLANT
SYR BULB IRRIGATION 50ML (SYRINGE) ×3 IMPLANT
SYR CONTROL 10ML LL (SYRINGE) ×3 IMPLANT
TOWEL OR 17X26 4PK STRL BLUE (TOWEL DISPOSABLE) ×3 IMPLANT
WATER STERILE IRR 1000ML POUR (IV SOLUTION) ×6 IMPLANT

## 2013-09-15 NOTE — Transfer of Care (Signed)
Immediate Anesthesia Transfer of Care Note  Patient: Marissa Turner  Procedure(s) Performed: Procedure(s): LEFT NECK CERVICAL NODE BIOPSY (Left)  Patient Location: PACU  Anesthesia Type:MAC  Level of Consciousness: awake, alert , oriented and patient cooperative  Airway & Oxygen Therapy: Patient Spontanous Breathing and Patient connected to nasal cannula oxygen  Post-op Assessment: Report given to PACU RN and Post -op Vital signs reviewed and stable  Post vital signs: Reviewed and stable  Complications: No apparent anesthesia complications

## 2013-09-15 NOTE — Anesthesia Postprocedure Evaluation (Signed)
  Anesthesia Post-op Note  Patient: Marissa Turner  Procedure(s) Performed: Procedure(s): LEFT NECK CERVICAL NODE BIOPSY (Left)  Patient Location: PACU  Anesthesia Type:MAC  Level of Consciousness: awake, alert , oriented and patient cooperative  Airway and Oxygen Therapy: Patient Spontanous Breathing and Patient connected to nasal cannula oxygen  Post-op Pain: mild  Post-op Assessment: Post-op Vital signs reviewed, Patient's Cardiovascular Status Stable, Respiratory Function Stable, Patent Airway, No signs of Nausea or vomiting and Pain level controlled  Post-op Vital Signs: Reviewed and stable  Last Vitals:  Filed Vitals:   09/15/13 1419  BP: 119/68  Pulse: 67  Temp: 37 C  Resp: 15    Complications: No apparent anesthesia complications

## 2013-09-15 NOTE — Anesthesia Procedure Notes (Signed)
Procedure Name: MAC Date/Time: 09/15/2013 12:53 PM Performed by: Andree Elk, Dwanda Tufano A Pre-anesthesia Checklist: Patient identified, Timeout performed, Emergency Drugs available, Suction available and Patient being monitored Oxygen Delivery Method: Simple face mask

## 2013-09-15 NOTE — Progress Notes (Signed)
22 yr. Old W. Female with Left cervical node that has increased in size.  She is a heavy smoker and pt had lung nodule and parapharyngeal abnormality noted on CT scan.  Cardiac clearance was obtained pre op and  this will be excised and sent as a fresh specimen without formalin.  Procedure and risks explained and informed consent obtained.  No clinical change since H&P.  Dict.  Filed Vitals:   09/15/13 0937  BP: 119/73  Pulse: 66  Temp: 98.4 F (36.9 C)  Resp: 18    INR 1.01, PT 13.3

## 2013-09-15 NOTE — Anesthesia Preprocedure Evaluation (Signed)
Anesthesia Evaluation  Patient identified by MRN, date of birth, ID band Patient awake    Reviewed: Allergy & Precautions, H&P , NPO status , Patient's Chart, lab work & pertinent test results  History of Anesthesia Complications (+) PONV and history of anesthetic complications  Airway Mallampati: II TM Distance: >3 FB Neck ROM: Full    Dental  (+) Edentulous Upper, Edentulous Lower   Pulmonary Current Smoker,  breath sounds clear to auscultation        Cardiovascular hypertension, Pt. on medications + dysrhythmias Atrial Fibrillation Rhythm:Regular Rate:Normal     Neuro/Psych PSYCHIATRIC DISORDERS Depression    GI/Hepatic PUD,   Endo/Other    Renal/GU      Musculoskeletal   Abdominal   Peds  Hematology   Anesthesia Other Findings   Reproductive/Obstetrics                           Anesthesia Physical Anesthesia Plan  ASA: III  Anesthesia Plan: MAC   Post-op Pain Management:    Induction: Intravenous  Airway Management Planned: Simple Face Mask  Additional Equipment:   Intra-op Plan:   Post-operative Plan:   Informed Consent: I have reviewed the patients History and Physical, chart, labs and discussed the procedure including the risks, benefits and alternatives for the proposed anesthesia with the patient or authorized representative who has indicated his/her understanding and acceptance.     Plan Discussed with: Surgeon  Anesthesia Plan Comments: (Pt refuses general anesthesia, wants MAC with local.)        Anesthesia Quick Evaluation

## 2013-09-15 NOTE — Op Note (Signed)
NAME:  Marissa Turner, Marissa Turner NO.:  [id number]  MEDICAL RECORD NO.:  [id number]  LOCATION:  APPO                          FACILITY:  [facility]  PHYSICIAN:  Felicie Morn, M.D. DATE OF BIRTH:  Dec 03, 1940  DATE OF PROCEDURE:  09/15/2013 DATE OF DISCHARGE:                              OPERATIVE REPORT   NOTE:  This is a 73 year old white female who had left cervical adenopathy, which was prominent, in which, did not respond to any antibiotics.  Worrisome was preoperative finding of an abnormality in the paraesophageal area as well as findings of some lung nodes.  We obtained a cardiac clearance as this patient has a history of atrial fibrillation and plan to do surgery after she had cardiac clearance.  We also try to get her to refrain from her heavy smoking somewhat as well.  We discussed surgery including the complications, not limited to, but including bleeding, infection, and the possibility of further surgery might be required, and informed consent was obtained.  GROSS OPERATIVE FINDINGS:  The patient had an enlarged lymph node in the left neck posterior to the sternocleidomastoid muscle on the left side, which was slightly smaller than the size of a golf ball and was shaped like a hen's egg.  WOUND CLASSIFICATION:  Clean.  ANESTHESIA:  Sedation with the use of 1% Xylocaine without epinephrine locally.  TECHNIQUE:  The patient was placed in the supine position with her head turned to the right, exposing an area of the obvious lymphadenopathy. This was marked with a sterile marker after prepping and draping in the usual manner.  A longitudinal incision was carried out slightly curvilinear over the palpated mass.  The skin, subcutaneous tissue, the platysma were dissected and the lymph node was with tedious dissection delivered into the wound.  Afferent and efferent blood vessels were ligated with 2-0 and 4-0 silk as needed and clipped as well with Surgiclips.   There was very little bleeding less than 5 mL.  This specimen was then sent in saline soaked gauze for permanent section and sent as a rush specimen.  We checked for hemostasis.  I irrigated with normal saline solution.  I elected to leave a small piece of Surgicel over this wound, then closed the wound with subcutaneous space with a 3- 0 Polysorb and the skin with a running 4-0 nylon suture as the patient was getting somewhat restless.  I chose not to leave a drain in place. A sterile dressing was applied.  The patient was taken to the recovery room in satisfactory condition.  As stated this patient will resume her Coumadin as instructed by the cardiologist, then we will follow this patient tomorrow and perioperatively.     Felicie Morn, M.D.     WB/MEDQ  D:  09/15/2013  T:  09/15/2013  Job:  [id number]  cc:   [name] D. Legrand Rams, MD Fax: [phone]  Herminio Commons, M.D.

## 2013-09-15 NOTE — Brief Op Note (Signed)
09/15/2013  2:25 PM  PATIENT:  Marissa Turner  73 y.o. female  PRE-OPERATIVE DIAGNOSIS:  enlarged lymph node left neck  POST-OPERATIVE DIAGNOSIS:  cervical lymphadenopathy left neck  PROCEDURE:  Procedure(s): LEFT NECK CERVICAL NODE BIOPSY (Left)  SURGEON:  Surgeon(s) and Role:    * Scherry Ran, MD - Primary  PHYSICIAN ASSISTANT:   ASSISTANTS: none   ANESTHESIA:   IV sedation  EBL:  Total I/O In: 1000 [I.V.:1000] Out: -   BLOOD ADMINISTERED:none  DRAINS: none   LOCAL MEDICATIONS USED:  XYLOCAINE  1% without epi  ~ 10 cc.  SPECIMEN:  Source of Specimen:  Left cervical adenopathy  DISPOSITION OF SPECIMEN:  PATHOLOGY  COUNTS:  YES  TOURNIQUET:  * No tourniquets in log *  DICTATION: .Other Dictation: Dictation Number OR dict # [id number].  PLAN OF CARE: Discharge to home after PACU  PATIENT DISPOSITION:  PACU - hemodynamically stable.   Delay start of Pharmacological VTE agent (>24hrs) due to surgical blood loss or risk of bleeding: not applicable

## 2013-09-15 NOTE — Progress Notes (Signed)
Post OP Check  Filed Vitals:   09/15/13 1500  BP:   Pulse: 68  Temp:   Resp: 12  BP 107/65  HR 70/min, temp 98.6, O2 sat 92%  Awake and alert. Dressings dry with just small spotting no swelling.Minimal discomfort.  Discharge and follow up arranged. Told to call or go to ER if any worrisome changes.  Doing very well post OP.

## 2013-09-16 ENCOUNTER — Encounter (HOSPITAL_COMMUNITY): Payer: Self-pay | Admitting: General Surgery

## 2013-09-17 ENCOUNTER — Other Ambulatory Visit: Payer: Self-pay | Admitting: General Surgery

## 2013-09-17 ENCOUNTER — Other Ambulatory Visit: Payer: Self-pay | Admitting: Physician Assistant

## 2013-09-17 DIAGNOSIS — Z5181 Encounter for therapeutic drug level monitoring: Secondary | ICD-10-CM | POA: Diagnosis not present

## 2013-09-17 DIAGNOSIS — I4891 Unspecified atrial fibrillation: Secondary | ICD-10-CM | POA: Diagnosis not present

## 2013-09-17 LAB — PROTIME-INR
INR: 1.09 (ref <1.50)
PROTHROMBIN TIME: 14.1 s (ref 11.6–15.2)

## 2013-09-22 ENCOUNTER — Other Ambulatory Visit (HOSPITAL_COMMUNITY): Payer: Self-pay | Admitting: Hematology and Oncology

## 2013-09-22 ENCOUNTER — Ambulatory Visit (INDEPENDENT_AMBULATORY_CARE_PROVIDER_SITE_OTHER): Payer: Medicare Other | Admitting: *Deleted

## 2013-09-22 DIAGNOSIS — I4891 Unspecified atrial fibrillation: Secondary | ICD-10-CM

## 2013-09-22 DIAGNOSIS — Z7901 Long term (current) use of anticoagulants: Secondary | ICD-10-CM | POA: Diagnosis not present

## 2013-09-22 DIAGNOSIS — C8591 Non-Hodgkin lymphoma, unspecified, lymph nodes of head, face, and neck: Secondary | ICD-10-CM

## 2013-09-22 DIAGNOSIS — I482 Chronic atrial fibrillation, unspecified: Secondary | ICD-10-CM

## 2013-09-22 DIAGNOSIS — Z5181 Encounter for therapeutic drug level monitoring: Secondary | ICD-10-CM | POA: Diagnosis not present

## 2013-09-22 LAB — POCT INR: INR: 1.2

## 2013-09-26 DIAGNOSIS — C8581 Other specified types of non-Hodgkin lymphoma, lymph nodes of head, face, and neck: Secondary | ICD-10-CM | POA: Diagnosis not present

## 2013-09-30 ENCOUNTER — Ambulatory Visit (HOSPITAL_COMMUNITY): Payer: Medicare Other

## 2013-10-04 ENCOUNTER — Other Ambulatory Visit: Payer: Self-pay | Admitting: *Deleted

## 2013-10-04 DIAGNOSIS — M25552 Pain in left hip: Secondary | ICD-10-CM

## 2013-10-04 MED ORDER — GABAPENTIN 100 MG PO CAPS: 100.0000 mg | ORAL_CAPSULE | Freq: Two times a day (BID) | ORAL | Status: DC

## 2013-10-06 ENCOUNTER — Encounter (HOSPITAL_COMMUNITY): Payer: Self-pay

## 2013-10-06 ENCOUNTER — Ambulatory Visit (HOSPITAL_COMMUNITY)
Admission: RE | Admit: 2013-10-06 | Discharge: 2013-10-06 | Disposition: A | Discharge status: Home / Self Care | Payer: Medicare Other | Source: Ambulatory Visit | Attending: Hematology and Oncology | Admitting: Hematology and Oncology

## 2013-10-06 DIAGNOSIS — F17200 Nicotine dependence, unspecified, uncomplicated: Secondary | ICD-10-CM | POA: Insufficient documentation

## 2013-10-06 DIAGNOSIS — C8581 Other specified types of non-Hodgkin lymphoma, lymph nodes of head, face, and neck: Secondary | ICD-10-CM | POA: Insufficient documentation

## 2013-10-06 DIAGNOSIS — C8591 Non-Hodgkin lymphoma, unspecified, lymph nodes of head, face, and neck: Secondary | ICD-10-CM

## 2013-10-06 DIAGNOSIS — R918 Other nonspecific abnormal finding of lung field: Secondary | ICD-10-CM | POA: Diagnosis not present

## 2013-10-06 DIAGNOSIS — R948 Abnormal results of function studies of other organs and systems: Secondary | ICD-10-CM | POA: Insufficient documentation

## 2013-10-06 DIAGNOSIS — C8589 Other specified types of non-Hodgkin lymphoma, extranodal and solid organ sites: Secondary | ICD-10-CM | POA: Diagnosis not present

## 2013-10-06 LAB — GLUCOSE, CAPILLARY: Glucose-Capillary: 116 mg/dL — ABNORMAL HIGH (ref 70–99)

## 2013-10-06 MED ORDER — FLUDEOXYGLUCOSE F - 18 (FDG) INJECTION: 6.0000 | Freq: Once | INTRAVENOUS | Status: AC | PRN

## 2013-10-10 ENCOUNTER — Encounter (HOSPITAL_COMMUNITY): Payer: Self-pay | Admitting: Lab

## 2013-10-10 ENCOUNTER — Telehealth (HOSPITAL_COMMUNITY): Payer: Self-pay

## 2013-10-10 ENCOUNTER — Encounter (HOSPITAL_COMMUNITY): Payer: Medicare Other | Attending: Hematology and Oncology

## 2013-10-10 ENCOUNTER — Encounter (HOSPITAL_COMMUNITY): Payer: Self-pay

## 2013-10-10 VITALS — BP 120/63 | HR 80 | Temp 98.2°F | Resp 20 | Wt 122.4 lb

## 2013-10-10 DIAGNOSIS — I10 Essential (primary) hypertension: Secondary | ICD-10-CM | POA: Diagnosis not present

## 2013-10-10 DIAGNOSIS — I4891 Unspecified atrial fibrillation: Secondary | ICD-10-CM

## 2013-10-10 DIAGNOSIS — Z7901 Long term (current) use of anticoagulants: Secondary | ICD-10-CM | POA: Diagnosis not present

## 2013-10-10 DIAGNOSIS — J4489 Other specified chronic obstructive pulmonary disease: Secondary | ICD-10-CM | POA: Diagnosis not present

## 2013-10-10 DIAGNOSIS — F3289 Other specified depressive episodes: Secondary | ICD-10-CM | POA: Insufficient documentation

## 2013-10-10 DIAGNOSIS — Z9089 Acquired absence of other organs: Secondary | ICD-10-CM

## 2013-10-10 DIAGNOSIS — F17200 Nicotine dependence, unspecified, uncomplicated: Secondary | ICD-10-CM | POA: Diagnosis not present

## 2013-10-10 DIAGNOSIS — J438 Other emphysema: Secondary | ICD-10-CM | POA: Diagnosis not present

## 2013-10-10 DIAGNOSIS — Z90710 Acquired absence of both cervix and uterus: Secondary | ICD-10-CM | POA: Diagnosis not present

## 2013-10-10 DIAGNOSIS — C8580 Other specified types of non-Hodgkin lymphoma, unspecified site: Secondary | ICD-10-CM | POA: Insufficient documentation

## 2013-10-10 DIAGNOSIS — M47817 Spondylosis without myelopathy or radiculopathy, lumbosacral region: Secondary | ICD-10-CM | POA: Diagnosis not present

## 2013-10-10 DIAGNOSIS — J449 Chronic obstructive pulmonary disease, unspecified: Secondary | ICD-10-CM | POA: Insufficient documentation

## 2013-10-10 DIAGNOSIS — C8589 Other specified types of non-Hodgkin lymphoma, extranodal and solid organ sites: Secondary | ICD-10-CM

## 2013-10-10 DIAGNOSIS — Z980 Intestinal bypass and anastomosis status: Secondary | ICD-10-CM | POA: Insufficient documentation

## 2013-10-10 DIAGNOSIS — F329 Major depressive disorder, single episode, unspecified: Secondary | ICD-10-CM | POA: Insufficient documentation

## 2013-10-10 DIAGNOSIS — Z9079 Acquired absence of other genital organ(s): Secondary | ICD-10-CM | POA: Insufficient documentation

## 2013-10-10 DIAGNOSIS — Z9081 Acquired absence of spleen: Secondary | ICD-10-CM

## 2013-10-10 DIAGNOSIS — I482 Chronic atrial fibrillation, unspecified: Secondary | ICD-10-CM

## 2013-10-10 LAB — CBC WITH DIFFERENTIAL/PLATELET
BASOS ABS: 0.1 10*3/uL (ref 0.0–0.1)
Basophils Relative: 1 % (ref 0–1)
EOS PCT: 3 % (ref 0–5)
Eosinophils Absolute: 0.4 10*3/uL (ref 0.0–0.7)
HCT: 39.5 % (ref 36.0–46.0)
Hemoglobin: 12.4 g/dL (ref 12.0–15.0)
Lymphocytes Relative: 45 % (ref 12–46)
Lymphs Abs: 5.3 10*3/uL — ABNORMAL HIGH (ref 0.7–4.0)
MCH: 25.6 pg — ABNORMAL LOW (ref 26.0–34.0)
MCHC: 31.4 g/dL (ref 30.0–36.0)
MCV: 81.6 fL (ref 78.0–100.0)
Monocytes Absolute: 1.1 10*3/uL — ABNORMAL HIGH (ref 0.1–1.0)
Monocytes Relative: 9 % (ref 3–12)
NEUTROS ABS: 4.9 10*3/uL (ref 1.7–7.7)
Neutrophils Relative %: 42 % — ABNORMAL LOW (ref 43–77)
PLATELETS: 400 10*3/uL (ref 150–400)
RBC: 4.84 MIL/uL (ref 3.87–5.11)
RDW: 17.2 % — AB (ref 11.5–15.5)
WBC: 11.7 10*3/uL — ABNORMAL HIGH (ref 4.0–10.5)

## 2013-10-10 LAB — COMPREHENSIVE METABOLIC PANEL
ALT: 24 U/L (ref 0–35)
AST: 27 U/L (ref 0–37)
Albumin: 3.8 g/dL (ref 3.5–5.2)
Alkaline Phosphatase: 70 U/L (ref 39–117)
Anion gap: 10 (ref 5–15)
BUN: 8 mg/dL (ref 6–23)
CALCIUM: 9.2 mg/dL (ref 8.4–10.5)
CO2: 30 mEq/L (ref 19–32)
CREATININE: 0.53 mg/dL (ref 0.50–1.10)
Chloride: 101 mEq/L (ref 96–112)
GFR calc non Af Amer: >90 mL/min (ref >90)
GLUCOSE: 77 mg/dL (ref 70–99)
Potassium: 5 mEq/L (ref 3.7–5.3)
SODIUM: 141 meq/L (ref 137–147)
TOTAL PROTEIN: 7.8 g/dL (ref 6.0–8.3)
Total Bilirubin: <0.2 mg/dL — ABNORMAL LOW (ref 0.3–1.2)

## 2013-10-10 LAB — RETICULOCYTES
RBC.: 4.84 MIL/uL (ref 3.87–5.11)
RETIC CT PCT: 1 % (ref 0.4–3.1)
Retic Count, Absolute: 48.4 10*3/uL (ref 19.0–186.0)

## 2013-10-10 LAB — LACTATE DEHYDROGENASE: LDH: 239 U/L (ref 94–250)

## 2013-10-10 NOTE — Patient Instructions (Signed)
[facility] Discharge Instructions  RECOMMENDATIONS MADE BY THE CONSULTANT AND ANY TEST RESULTS WILL BE SENT TO YOUR REFERRING PHYSICIAN.  We will see you in 2 weeks for follow up. Lupita Raider will call you with chemotherapy education. Dr.Bradford's office will call you for appointment to have a lifeport placed. You will need to stop your coumadin 1 week prior to your lifeport being placed.  Please call for any questions or concerns.    Thank you for choosing [facility] to provide your oncology and hematology care.  To afford each patient quality time with our providers, please arrive at least 15 minutes before your scheduled appointment time.  With your help, our goal is to use those 15 minutes to complete the necessary work-up to ensure our physicians have the information they need to help with your evaluation and healthcare recommendations.    Effective January 1st, 2014, we ask that you re-schedule your appointment with our physicians should you arrive 10 or more minutes late for your appointment.  We strive to give you quality time with our providers, and arriving late affects you and other patients whose appointments are after yours.    Again, thank you for choosing [facility].  Our hope is that these requests will decrease the amount of time that you wait before being seen by our physicians.       _____________________________________________________________  Should you have questions after your visit to [facility], please contact our office at [phone] between the hours of 8:30 a.m. and 4:30 p.m.  Voicemails left after 4:30 p.m. will not be returned until the following business day.  For prescription refill requests, have your pharmacy contact our office with your prescription refill request.    _______________________________________________________________  We hope that we have given you very good care.  You may  receive a patient satisfaction survey in the mail, please complete it and return it as soon as possible.  We value your feedback!  _______________________________________________________________  Have you asked about our STAR program?  STAR stands for Survivorship Training and Rehabilitation, and this is a nationally recognized cancer care program that focuses on survivorship and rehabilitation.  Cancer and cancer treatments may cause problems, such as, pain, making you feel tired and keeping you from doing the things that you need or want to do. Cancer rehabilitation can help. Our goal is to reduce these troubling effects and help you have the best quality of life possible.  You may receive a survey from a nurse that asks questions about your current state of health.  Based on the survey results, all eligible patients will be referred to the [facility] program for an evaluation so we can better serve you!  A frequently asked questions sheet is available upon request.

## 2013-10-10 NOTE — Progress Notes (Signed)
[place] A. Barnet Glasgow, M.D.  NEW PATIENT EVALUATION   Name: Marissa Turner Date: 10/10/2013 MRN: [id number] DOB: 1940-11-08  PCP: Rosita Fire, MD   REFERRING PHYSICIAN: Rosita Fire, MD  REASON FOR REFERRAL: Diffuse large B-cell lymphoma     HISTORY OF PRESENT ILLNESS:Marissa Turner is a 73 y.o. female who is referred by her family physician and surgeon because of cervical lymphadenopathy, biopsy of which was consistent with diffuse large B cell lymphoma. Patient has had an outpatient PET scan prior to this visit. She noticed a submental lymph node and with her family physician who suggested she'll follow with an come back in 6 months. She insisted on a second opinion and went to see Dr. Romona Curls who had operated on her in the past. He did a biopsy which revealed diffuse large B-cell lymphoma. As an outpatient she had a PET/CT scan performed for staging and was found to have disease only in the vallecula and the left neck. She feels well with no dysphagia, worsening cough or shortness of breath, fever, night sweats, easy satiety, diarrhea, constipation, lower extremity swelling or redness, skin rash, headache, or seizures.   PAST MEDICAL HISTORY:  has a past medical history of Atrial fibrillation, chronic (2004); Chronic anticoagulation (2004); Tobacco abuse; DJD (degenerative joint disease), lumbosacral; Peptic ulcer disease; Depression; Fracture of wrist; Hypertension; PONV (postoperative nausea and vomiting); and Dysrhythmia.     PAST SURGICAL HISTORY: Past Surgical History  Procedure Laterality Date  . Cholecystectomy  2004    [place] II; splenectomy; incidental appendectomy  . Abdominal hysterectomy      w/o oophorectomy  . Cesarean section    . Tonsillectomy    . Colonoscopy  1980s    patient denies ever having undergone colonoscopy as of 2014  . Appendectomy    .  Splenectomy, total    . Lymph node biopsy Left 09/15/2013    Procedure: LEFT NECK CERVICAL NODE BIOPSY;  Surgeon: Scherry Ran, MD;  Location: [facility];  Service: General;  Laterality: Left;     CURRENT MEDICATIONS: has a current medication list which includes the following prescription(s): acetaminophen, alprazolam, cetirizine, citalopram, digoxin, diltiazem, gabapentin, metoprolol succinate, tramadol, and warfarin.   ALLERGIES: Diphenhydramine hcl; Estrogens; Nicoderm; Penicillins; and Codeine   SOCIAL HISTORY:  reports that she has been smoking Cigarettes.  She has a 13.75 pack-year smoking history. She does not have any smokeless tobacco history on file. She reports that she does not drink alcohol or use illicit drugs.   FAMILY HISTORY: family history includes Breast cancer in her sister and sister; Cancer in her brother; Cirrhosis in her sister; Heart attack in her brother and mother.    REVIEW OF SYSTEMS:  Other than that discussed above is noncontributory.    PHYSICAL EXAM:  weight is 122 lb 6.4 oz (55.52 kg). Her oral temperature is 98.2 F (36.8 C). Her blood pressure is 120/63 and her pulse is 80. Her respiration is 20 and oxygen saturation is 94%.    GENERAL:alert, no distress and comfortable SKIN: skin color, texture, turgor are normal, no rashes or significant lesions EYES: normal, Conjunctiva are pink and non-injected, sclera clear OROPHARYNX:no exudate, no erythema and lips, buccal mucosa, and tongue normal  NECK: supple, thyroid normal size, non-tender, without nodularity CHEST: Increased AP diameter with no breast masses. LYMPH:  no palpable lymphadenopathy in the cervical, axillary or  inguinal LUNGS: clear to auscultation and percussion with normal breathing effort HEART: Irregular irregular with no S3. ABDOMEN:abdomen soft, non-tender and normal bowel sounds. Right upper quadrant midline surgical scars are well-healed. Liver 9 x 6 cm. Spleen not palpable.  Currently patient had a splenectomy in the past. MUSCULOSKELETALl:no cyanosis of digits, no clubbing or edema  NEURO: alert & oriented x 3 with fluent speech, no focal motor/sensory deficits    LABORATORY DATA:  Hospital Outpatient Visit on 10/06/2013  Component Date Value Ref Range Status  . Glucose-Capillary 10/06/2013 116* 70 - 99 mg/dL Final  Anti-coag visit on 09/22/2013  Component Date Value Ref Range Status  . INR 09/22/2013 1.2   Final  Orders Only on 09/17/2013  Component Date Value Ref Range Status  . Prothrombin Time 09/17/2013 14.1  11.6 - 15.2 seconds Final  . INR 09/17/2013 1.09  <1.50 Final   Comment: The INR is of principal utility in following patients on stable doses                          of oral anticoagulants.  The therapeutic range is generally 2.0 to                          3.0, but may be 3.0 to 4.0 in patients with mechanical cardiac valves,                          recurrent embolisms and antiphospholipid antibodies (including lupus                          inhibitors).  Admission on 09/15/2013, Discharged on 09/15/2013  Component Date Value Ref Range Status  . Prothrombin Time 09/15/2013 13.3  11.6 - 15.2 seconds Final  . INR 09/15/2013 1.01  0.00 - 1.49 Final    Urinalysis No results found for this basename: colorurine,  appearanceur,  labspec,  phurine,  glucoseu,  hgbur,  bilirubinur,  ketonesur,  proteinur,  urobilinogen,  nitrite,  leukocytesur      _0 : Nm Pet Image Initial (pi) Skull Base To Thigh  10/06/2013   CLINICAL DATA:  Initial treatment strategy for non-Hodgkin's lymphoma. Large cell lymphoma of the neck.  EXAM: NUCLEAR MEDICINE PET SKULL BASE TO THIGH  TECHNIQUE: 6.0 mCi F-18 FDG was injected intravenously. Full-ring PET imaging was performed from the skull base to thigh after the radiotracer. CT data was obtained and used for attenuation correction and anatomic localization.  FASTING BLOOD GLUCOSE:  Value: 116 mg/dl   COMPARISON:  Head CT, neck CT 08/05/2013  FINDINGS: NECK  There is asymmetric hypermetabolic activity within the right vallecular region with intense metabolic activity (SUV max of 13.8). There there is an adjacent hypermetabolic enlarged right level II lymph node measuring 14 mm short axis (image 33, series 4) with SUV max 3.2. Several small left and right level 2 lymph node less than 1 cm and do not have associated metabolic activity (image 31 and 30 fused series).  CHEST  Small right upper lobe pulmonary nodule described on comparison neck CT does not have associated metabolic activity measuring 7 mm (image 70, series 4). Within the lingula there is a rounded 11 mm nodule (image 75, series 4) which has very low metabolic activity for size (SUV max 1.0). There is a mildly hypermetabolic right lower paratracheal lymph node measuring 12 mm  short axis SUV max 2.3.  ABDOMEN/PELVIS  No abnormal hypermetabolic activity within the liver, pancreas, adrenal glands, or spleen. No hypermetabolic lymph nodes in the abdomen or pelvis.  SKELETON  No focal hypermetabolic activity to suggest skeletal metastasis.  IMPRESSION: 1. Hypermetabolic thickening within the right vallecular consists with primary head and neck cancer. 2. Single enlarged hypermetabolic right level IIa lymph node consistent local nodal metastasis. 3. Small sub cm bilateral additional level 2 lymph nodes which does not have associated metabolic activity. 4. Mildly hypermetabolic right lower paratracheal lymph node is likely reactive. 5. Rounded nodule in the lingula has very low metabolic activity, therefore it is unlikely a metastatic lesion. Recommend attention follow-up. 6. Right upper lobe pulmonary nodules without associated metabolic activity is likely post inflammatory or infectious.   Electronically Signed   By: Suzy Bouchard M.D.   On: 10/06/2013 11:24    PATHOLOGY:  for JAVIANA, ANWAR ([id number]) Patient: RENAY, CRAMMER Collected:  09/15/2013 Client: [facility] Accession: [id number] Received: 09/15/2013 Felicie Morn DOB: 1941/01/16 Age: 77 Gender: F Reported: 09/19/2013 [address] Patient Ph: [phone] MRN #: [id number] Linna Hoff [place] [address] Visit #: [id number] Chart #: Phone: [phone] Fax: CC: FLOW CYTOMETRY REPORT INTERPRETATION Interpretation Tissue-Flow Cytometry - PREDOMINANCE OF B CELLS. - SEE NOTE. Diagnosis Comment: Analysis of the lymphoid population shows predominance of B cells displaying pan B cell antigens including CD20 with lack of CD5 or CD10. B cells display lambda light chain excess. The overall features are worrisome for a B cell lymphoproliferative process. (BNS:gt, 09/19/13) Susanne Greenhouse MD Pathologist, Electronic Signature (Case signed 09/19/2013) GROSS AND MICROSCOPIC INFORMATION Source Tissue-Flow Cytometry Microscopic Gated population: Flow cytometric immunophenotyping is performed using antibiodies to the antigens listed in the table below. Electronic gates are placed around a cell cluster displaying light scatter properties corresponding to lymphocytes. - Abnormal Cells in gated population: see note. - Phenotype of Abnormal Cells: see note. 1 of 2 FINAL for AREAL, COCHRANE ([id number]) Specimen Table Lymphoid Associated Myeloid Associated Misc. As CD2 tested CD19 tested CD11c ND CD45 ND CD3 tested CD20 tested CD13 ND HLA-DR tested CD4 tested CD21 tested CD14 ND CD10 tested CD5 tested CD22 tested CD15 ND CD56/16 ND CD7 tested CD23 tested CD33 ND ZAP70 ND CD8 tested CD103 ND MPO ND CD34 ND CD25 ND FMC7 ND CD117 ND CD52 ND sKappa tested CD38 ND sLambda tested 7AAD tested cKappa ND cLambda ND Gross ref. [id number] All controls stained appropriately. The above tests were developed and their performance characteristics determined by the [facility]. They have not been cleared or approved by the U.S. Food and Drug Administration." Report signed  from the following location(s) Interpretation performed at [place], [place], [place] [address]. CLIA #:   for MYESHA, STILLION ([id number]) Patient: ANAIRA, SEAY Collected: 09/15/2013 Client: [facility] Accession: [id number] Received: 09/15/2013 Felicie Morn DOB: 1940-02-23 Age: 20 Gender: F Reported: 09/19/2013 [address] Patient Ph: [phone] MRN #: [id number] Linna Hoff [place] [address] Visit #: [id number] Chart #: Phone: [phone] Fax: CC: REPORT OF SURGICAL PATHOLOGY FINAL DIAGNOSIS Diagnosis Lymph node, biopsy, cervical left neck - DIFFUSE LARGE B CELL LYMPHOMA. - SEE ONCOLOGY TABLE. Microscopic Comment LYMPHOMA Histologic type: Non-Hodgkin's lymphoma, diffuse large cell type. Grade (if applicable): High grade. Flow cytometry: Predominance of B cells with lambda light chain excess ([id number]) Immunohistochemical stains: BCL-2, BCL-6, CD3, CD10, CD20, CD30, CD43, CD79a, CD138 performed on block 1B with appropriate controls. Touch preps/imprints: Relative  abundance of large lymphoid cells. Comments: The sections show effacement of the lymphoid architecture by a diffuse relatively monomorphic lymphoproliferative process characterized by predominance of large, occasionally multilobated lymphoid cells displaying vesicular chromatin, prominent nucleoli, and scantly to moderately abundant amphophilic cytoplasm. This is associated with brisk mitoses and prominent apoptosis. Definite follicular or nodular structures are not present. Flow cytometric analysis ([id number]) and shows predominance of B cells with lambda light chain excess. Immunohistochemical stains show that the large atypical lymphoid cells are positive for B cell markers (CD20 and CD79a) in addition to positivity for BCL-6 and patchy weak positivity for BCL-2. No significant positivity is seen with CD10, CD30, or CD138. There is a admixed minor component of T cells as  seen with CD3 and CD43. The histologic and immunophenotypic features are consistent with diffuse large B cell lymphoma. (BNS:gt, 09/19/13) Susanne Greenhouse MD Pathologist, Electronic Signature (Case signed 09/19/2013) Specimen Gross and Clinical Information Specimen(s) Obtained: Lymph node, biopsy, cervical left neck Specimen Clinical Information enlarged lymph node 1 of 2 FINAL for SONYIA, MURO ([id number]) Gross Received fresh is a 3.7 x 2.2 x 2 cm rubbery ovoid nodule. The cut surface is solid, white and nodular. Touch preparations are made from the cut surface and a portion of the specimen is placed in RPMI for flow cytometry. Sections are submitted in two cassettes. (GRP:ecj 09/15/2013) Stain(s) used in Diagnosis: The following stain(s) were used in diagnosing the case: BCl 6 , CD 79a, CD 30, BCl 2, CD 138, CD 3, CD 43, CD-10, CD 20. The control(s) stained appropriately. Disclaimer Some of these immunohistochemical stains may have been developed and the performance characteristics determined by [facility]. Some may not have been cleared or approved by the U.S. Food and Drug Administration. The FDA has determined that such clearance or approval is not necessary. This test is used for clinical purposes. It should not be regarded as investigational or for research. This laboratory is certified under the [facility] (CLIA-88) as qualified to perform high complexity clinical laboratory testing. Report signed out from the following location(s) Technical Component and Interpretation performed at [place], [place], [place]   IMPRESSION:  #1. 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 #2. Chronic obstructive pulmonary disease #3. Chronic atrial fibrillation. #4. 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 #5. Status post cholecystectomy  for gangrenous gallbladder 2004. #6. Status post TAH/BSO. #7. Status post C-section #8. Status post appendectomy.   PLAN:  #1. In the presence of her sister who accompanied her today, it was explained that despite the small volume of disease, histology governs treatment decisions regarding lymphoma. #2. With this in mind and also considering the patient's comorbidity, it was recommended that she be treated with bendamustine and Rituxan with Neulasta support for these 4 cycles. #3. She was shown a LifePort will be inserted by her surgeon hopefully on 10/20/2013. #4. Begin chemotherapy after chemotherapy teaching with plans to initiate therapy on 10/24/2013. #5. Both the patient and her sister are in agreement with this strategy. The patient was told to stop warfarin on 10/17/2013 anticipation of life port insertion 3 days later.   I appreciate the opportunity of sharing in her care.   Doroteo Bradford, MD 10/10/2013 3:13 PM   DISCLAIMER:  This note was dictated with voice recognition softwre.  Similar sounding words can inadvertently be transcribed inaccurately and may not be corrected upon review.

## 2013-10-10 NOTE — Progress Notes (Signed)
Patient to see Dr Romona Curls on 9/24 in his office.

## 2013-10-12 ENCOUNTER — Telehealth (HOSPITAL_COMMUNITY): Payer: Self-pay | Admitting: *Deleted

## 2013-10-12 LAB — BETA 2 MICROGLOBULIN, SERUM: BETA 2 MICROGLOBULIN: 2.28 mg/L — AB (ref <=2.51)

## 2013-10-12 NOTE — Telephone Encounter (Signed)
I have attempted to call patient this am x2 but phone is ringing busy. I need to get patient scheduled for chemo. She has not returned my call from yesterday.

## 2013-10-13 ENCOUNTER — Other Ambulatory Visit (HOSPITAL_COMMUNITY): Payer: Self-pay | Admitting: Oncology

## 2013-10-13 ENCOUNTER — Inpatient Hospital Stay (HOSPITAL_COMMUNITY): Payer: Medicare Other

## 2013-10-13 MED ORDER — PROCHLORPERAZINE MALEATE 10 MG PO TABS: 10.0000 mg | ORAL_TABLET | Freq: Four times a day (QID) | ORAL | Status: DC | PRN

## 2013-10-13 MED ORDER — LIDOCAINE-PRILOCAINE 2.5-2.5 % EX CREA: TOPICAL_CREAM | CUTANEOUS | Status: DC

## 2013-10-13 NOTE — Patient Instructions (Addendum)
[facility]   CHEMOTHERAPY INSTRUCTIONS  Prior to receiving your chemo you will receive the following drugs:  Aloxi - this drug works to keep you from getting nauseated/vomiting  (receive on Day 1 & Day 2)  Emend - this drug works to keep you from getting nauseated/vomiting (receive on Day 1)  Dexamethasone - steroid - this drug works to keep you from getting nauseated/vomiting as well as it decreases your risk of having an allergic reaction to the chemo. Side effects of the drug: irritability, trouble sleeping, increase in energy, turn red or pink in face/neck/chest or feeling flushed in those areas. This will pass as the drug wears off. (receive on Day 1 & Day 2)  Tylenol - decrease the risk of you have a fever with the Rituxan infusion (receive on Day 1)  Claritin - anti-histamine - decreases the risk of you having an allergic reaction to the Rituxan infusion (receive on Day 1)   Rituxan - Prior to receiving this drug we will give you Benadryl and Tylenol. This reduces your risk of having an allergic reaction to the Rituxan.    Side Effects: during infusion - itching, low blood pressure, low oxygen, bronchospasm, rash, trouble breathing - we need to know immediately if any of this happens. The first time you receive this drug, it takes a long time to infuse because we titrate the drug very slowly. With each Rituxan infusion, the likelihood of developing an infusion reaction decreases. You may also experience fever, chills, shaking chills, headaches, muscle aches, nausea, rash, and a low white blood cell count. We need to be sure that you are drinking plenty of fluids - preferably 64oz of decaff fluids/water daily.  (This will take approximately 4-5 hours to infuse if you don't have any side effects while the medication is being administered) (Subsequent visits will take 3 hours) You will receive this medication on Day 1 every 28 days.   Bendamustine - Side  Effects: low white blood cell count, low platelet count, fever, nausea, vomiting. This infuses over 60 minutes. (You will get this drug on Day 1 & 2 every 28 days)   Neulasta - this medication is not chemo but being given because you have had chemo. It is usually given 20-24 hours after the completion of chemotherapy. This medication works by boosting your bone marrow's supply of white blood cells (neutrophils). White blood cells (neutrophils) are what protect our bodies against infection. The medication is given in the form of a subcutaneous injection. It is given in the fatty tissue of your abdomen. It is a short needle. The major side effect of this medication is bone or muscle pain. The drug of choice to relieve or lessen the pain is Aleve or Ibuprofen. If a physician has ever told you not to take Aleve or Ibuprofen - then don't take it. You should then take Tylenol/acetaminophen. Take either medication as the bottle directs you to. If you have narcotic pain medication, take it to relieve the pain. The level of pain you experience as a result of this injection can range from none, to mild or moderate, or severe. Please let us know if you develop moderate or severe bone pain.   You will receive this medication on Day 3 of your treatment regimen. This visit will only last approximately 10 minutes.    POTENTIAL SIDE EFFECTS OF TREATMENT:  Increased Susceptibility to Infection, Vomiting, Constipation, Hair Thinning, Changes in Character of Skin and Nails (  brittleness, dryness,etc.), Bone Marrow Suppression, Abdominal Cramping, Urinary Frequency, Blood in Urine, Nausea, Diarrhea, Painful Urination, Pus in Urine, Sun Sensitivity and Mouth Sores    EDUCATIONAL MATERIALS GIVEN AND REVIEWED: Chemotherapy and You booklet Specific Instructions Sheets: Rituxan, Bendamustine, Neulasta, Aloxi, Emend, Dexamethasone, Tylenol, Claritin, Acyclovir, EMLA cream, port-a-cath, prochlorperazine   SELF CARE ACTIVITIES  WHILE ON CHEMOTHERAPY: Increase your fluid intake 48 hours prior to treatment and drink at least 2 quarts per day after treatment., No alcohol intake., No aspirin or other medications unless approved by your oncologist., Eat foods that are light and easy to digest., Eat foods at cold or room temperature., No fried, fatty, or spicy foods immediately before or after treatment., Have teeth cleaned professionally before starting treatment. Keep dentures and partial plates clean., Use soft toothbrush and do not use mouthwashes that contain alcohol. Biotene is a good mouthwash that is available at most pharmacies or may be ordered by calling [phone]., Use warm salt water gargles (1 teaspoon salt per 1 quart warm water) before and after meals and at bedtime. Or you may rinse with 2 tablespoons of three -percent hydrogen peroxide mixed in eight ounces of water., Always use sunscreen with SPF (Sun Protection Factor) of 30 or higher., Use your nausea medication as directed to prevent nausea., Use your stool softener or laxative as directed to prevent constipation. and Use your anti-diarrheal medication as directed to stop diarrhea.  Please wash your hands for at least 30 seconds using warm soapy water. Handwashing is the #1 way to prevent the spread of germs. Stay away from sick people or people who are getting over a cold. If you develop respiratory systems such as green/yellow mucus production or productive cough or persistent cough let us know and we will see if you need an antibiotic. It is a good idea to keep a pair of gloves on when going into grocery stores/[employer] to decrease your risk of coming into contact with germs on the carts, etc. Carry alcohol hand gel with you at all times and use it frequently if out in public. All foods need to be cooked thoroughly. No raw foods. No medium or undercooked meats, eggs. If your food is cooked medium well, it does not need to be hot pink or saturated with bloody  liquid at all. Vegetables and fruits need to be washed/rinsed under the faucet with a dish detergent before being consumed. You can eat raw fruits and vegetables unless we tell you otherwise but it would be best if you cooked them or bought frozen. Do not eat off of salad bars or hot bars unless you really trust the cleanliness of the restaurant. If you need dental work, please let us know before you go for your appointment so that we can coordinate the best possible time for you in regards to your chemo regimen. You need to also let your dentist know that you are actively taking chemo. We may need to do labs prior to your dental appointment. We also want your bowels moving at least every other day. If this is not happening, we need to know so that we can get you on a bowel regimen to help you go.    MEDICATIONS: You have been given prescriptions for the following medications:  Acyclovir 400mg  tablet. Take 1 tablet everyday. This is an anti-viral drug. This will help decrease the risk of or severity of some viral infections such as shingles.   Prochlorperazine/Compazine 10mg  tablet. Take 1 tablet every 6 hours  if needed for nausea/vomiting.  EMLA cream. Apply a quarter size amount to port site 1 hour prior to chemo. Do not rub in. Cover with plastic wrap.   SYMPTOMS TO REPORT AS SOON AS POSSIBLE AFTER TREATMENT:  FEVER GREATER THAN 100.5 F  CHILLS WITH OR WITHOUT FEVER  NAUSEA AND VOMITING THAT IS NOT CONTROLLED WITH YOUR NAUSEA MEDICATION  UNUSUAL SHORTNESS OF BREATH  UNUSUAL BRUISING OR BLEEDING  TENDERNESS IN MOUTH AND THROAT WITH OR WITHOUT PRESENCE OF ULCERS  URINARY PROBLEMS  BOWEL PROBLEMS  UNUSUAL RASH    Wear comfortable clothing and clothing appropriate for easy access to any Portacath or PICC line. Let us know if there is anything that we can do to make your therapy better!      I have been informed and understand all of the instructions given to me and have  received a copy. I have been instructed to call the clinic [phone] or my family physician as soon as possible for continued medical care, if indicated. I do not have any more questions at this time but understand that I may call the [facility] or the Patient Navigator at [phone] during office hours should I have questions or need assistance in obtaining follow-up care.            Rituximab injection What is this medicine? RITUXIMAB (ri TUX i mab) is a monoclonal antibody. This medicine changes the way the body's immune system works. It is used commonly to treat non-Hodgkin's lymphoma and other conditions. In cancer cells, this drug targets a specific protein within cancer cells and stops the cancer cells from growing. It is also used to treat rhuematoid arthritis (RA). In RA, this medicine slow the inflammatory process and help reduce joint pain and swelling. This medicine is often used with other cancer or arthritis medications. This medicine may be used for other purposes; ask your health care provider or pharmacist if you have questions. COMMON BRAND NAME(S): Rituxan What should I tell my health care provider before I take this medicine? They need to know if you have any of these conditions: -blood disorders -heart disease -history of hepatitis B -infection (especially a virus infection such as chickenpox, cold sores, or herpes) -irregular heartbeat -kidney disease -lung or breathing disease, like asthma -lupus -an unusual or allergic reaction to rituximab, mouse proteins, other medicines, foods, dyes, or preservatives -pregnant or trying to get pregnant -breast-feeding How should I use this medicine? This medicine is for infusion into a vein. It is administered in a hospital or clinic by a specially trained health care professional. A special MedGuide will be given to you by the pharmacist with each prescription and refill. Be sure to read this information  carefully each time. Talk to your pediatrician regarding the use of this medicine in children. This medicine is not approved for use in children. Overdosage: If you think you have taken too much of this medicine contact a poison control center or emergency room at once. NOTE: This medicine is only for you. Do not share this medicine with others. What if I miss a dose? It is important not to miss a dose. Call your doctor or health care professional if you are unable to keep an appointment. What may interact with this medicine? -cisplatin -medicines for blood pressure -some other medicines for arthritis -vaccines This list may not describe all possible interactions. Give your health care provider a list of all the medicines, herbs, non-prescription drugs, or dietary supplements  you use. Also tell them if you smoke, drink alcohol, or use illegal drugs. Some items may interact with your medicine. What should I watch for while using this medicine? Report any side effects that you notice during your treatment right away, such as changes in your breathing, fever, chills, dizziness or lightheadedness. These effects are more common with the first dose. Visit your prescriber or health care professional for checks on your progress. You will need to have regular blood work. Report any other side effects. The side effects of this medicine can continue after you finish your treatment. Continue your course of treatment even though you feel ill unless your doctor tells you to stop. Call your doctor or health care professional for advice if you get a fever, chills or sore throat, or other symptoms of a cold or flu. Do not treat yourself. This drug decreases your body's ability to fight infections. Try to avoid being around people who are sick. This medicine may increase your risk to bruise or bleed. Call your doctor or health care professional if you notice any unusual bleeding. Be careful brushing and flossing your  teeth or using a toothpick because you may get an infection or bleed more easily. If you have any dental work done, tell your dentist you are receiving this medicine. Avoid taking products that contain aspirin, acetaminophen, ibuprofen, naproxen, or ketoprofen unless instructed by your doctor. These medicines may hide a fever. Do not become pregnant while taking this medicine. Women should inform their doctor if they wish to become pregnant or think they might be pregnant. There is a potential for serious side effects to an unborn child. Talk to your health care professional or pharmacist for more information. Do not breast-feed an infant while taking this medicine. What side effects may I notice from receiving this medicine? Side effects that you should report to your doctor or health care professional as soon as possible: -allergic reactions like skin rash, itching or hives, swelling of the face, lips, or tongue -low blood counts - this medicine may decrease the number of white blood cells, red blood cells and platelets. You may be at increased risk for infections and bleeding. -signs of infection - fever or chills, cough, sore throat, pain or difficulty passing urine -signs of decreased platelets or bleeding - bruising, pinpoint red spots on the skin, black, tarry stools, blood in the urine -signs of decreased red blood cells - unusually weak or tired, fainting spells, lightheadedness -breathing problems -confused, not responsive -chest pain -fast, irregular heartbeat -feeling faint or lightheaded, falls -mouth sores -redness, blistering, peeling or loosening of the skin, including inside the mouth -stomach pain -swelling of the ankles, feet, or hands -trouble passing urine or change in the amount of urine Side effects that usually do not require medical attention (report to your doctor or other health care professional if they continue or are bothersome): -anxiety -headache -loss of  appetite -muscle aches -nausea -night sweats This list may not describe all possible side effects. Call your doctor for medical advice about side effects. You may report side effects to FDA at 1-800-FDA-1088. Where should I keep my medicine? This drug is given in a hospital or clinic and will not be stored at home. NOTE: This sheet is a summary. It may not cover all possible information. If you have questions about this medicine, talk to your doctor, pharmacist, or health care provider.  2015, Elsevier/Gold Standard. (2007-09-13 14:04:59) Bendamustine Injection What is this medicine? BENDAMUSTINE (BEN  da MUS teen) is a chemotherapy drug. It is used to treat chronic lymphocytic leukemia and non-Hodgkin lymphoma. This medicine may be used for other purposes; ask your health care provider or pharmacist if you have questions. COMMON BRAND NAME(S): Treanda What should I tell my health care provider before I take this medicine? They need to know if you have any of these conditions: -kidney disease -liver disease -an unusual or allergic reaction to bendamustine, mannitol, other medicines, foods, dyes, or preservatives -pregnant or trying to get pregnant -breast-feeding How should I use this medicine? This medicine is for infusion into a vein. It is given by a health care professional in a hospital or clinic setting. Talk to your pediatrician regarding the use of this medicine in children. Special care may be needed. Overdosage: If you think you have taken too much of this medicine contact a poison control center or emergency room at once. NOTE: This medicine is only for you. Do not share this medicine with others. What if I miss a dose? It is important not to miss your dose. Call your doctor or health care professional if you are unable to keep an appointment. What may interact with this medicine? Do not take this medicine with any of the following medications: -clozapine This medicine may  also interact with the following medications: -atazanavir -cimetidine -ciprofloxacin -enoxacin -fluvoxamine -medicines for seizures like carbamazepine and phenobarbital -mexiletine -rifampin -tacrine -thiabendazole -zileuton This list may not describe all possible interactions. Give your health care provider a list of all the medicines, herbs, non-prescription drugs, or dietary supplements you use. Also tell them if you smoke, drink alcohol, or use illegal drugs. Some items may interact with your medicine. What should I watch for while using this medicine? Your condition will be monitored carefully while you are receiving this medicine. This drug may make you feel generally unwell. This is not uncommon, as chemotherapy can affect healthy cells as well as cancer cells. Report any side effects. Continue your course of treatment even though you feel ill unless your doctor tells you to stop. Call your doctor or health care professional for advice if you get a fever, chills or sore throat, or other symptoms of a cold or flu. Do not treat yourself. This drug decreases your body's ability to fight infections. Try to avoid being around people who are sick. This medicine may increase your risk to bruise or bleed. Call your doctor or health care professional if you notice any unusual bleeding. Be careful brushing and flossing your teeth or using a toothpick because you may get an infection or bleed more easily. If you have any dental work done, tell your dentist you are receiving this medicine. Avoid taking products that contain aspirin, acetaminophen, ibuprofen, naproxen, or ketoprofen unless instructed by your doctor. These medicines may hide a fever. Do not become pregnant while taking this medicine. Women should inform their doctor if they wish to become pregnant or think they might be pregnant. There is a potential for serious side effects to an unborn child. Men should inform their doctors if they  wish to father a child. This medicine may lower sperm counts. Talk to your health care professional or pharmacist for more information. Do not breast-feed an infant while taking this medicine. What side effects may I notice from receiving this medicine? Side effects that you should report to your doctor or health care professional as soon as possible: -allergic reactions like skin rash, itching or hives, swelling of the face,  lips, or tongue -low blood counts - this medicine may decrease the number of white blood cells, red blood cells and platelets. You may be at increased risk for infections and bleeding. -signs of infection - fever or chills, cough, sore throat, pain or difficulty passing urine -signs of decreased platelets or bleeding - bruising, pinpoint red spots on the skin, black, tarry stools, blood in the urine -signs of decreased red blood cells - unusually weak or tired, fainting spells, lightheadedness -trouble passing urine or change in the amount of urine Side effects that usually do not require medical attention (report to your doctor or health care professional if they continue or are bothersome): -diarrhea This list may not describe all possible side effects. Call your doctor for medical advice about side effects. You may report side effects to FDA at 1-800-FDA-1088. Where should I keep my medicine? This drug is given in a hospital or clinic and will not be stored at home. NOTE: This sheet is a summary. It may not cover all possible information. If you have questions about this medicine, talk to your doctor, pharmacist, or health care provider.  2015, [employer]/Gold Standard. (2011-04-11 14:15:47) Pegfilgrastim injection What is this medicine? PEGFILGRASTIM (peg fil GRA stim) is a long-acting granulocyte colony-stimulating factor that stimulates the growth of neutrophils, a type of white blood cell important in the body's fight against infection. It is used to reduce the incidence  of fever and infection in patients with certain types of cancer who are receiving chemotherapy that affects the bone marrow. This medicine may be used for other purposes; ask your health care provider or pharmacist if you have questions. COMMON BRAND NAME(S): Neulasta What should I tell my health care provider before I take this medicine? They need to know if you have any of these conditions: -latex allergy -ongoing radiation therapy -sickle cell disease -skin reactions to acrylic adhesives (On-Body Injector only) -an unusual or allergic reaction to pegfilgrastim, filgrastim, other medicines, foods, dyes, or preservatives -pregnant or trying to get pregnant -breast-feeding How should I use this medicine? This medicine is for injection under the skin. If you get this medicine at home, you will be taught how to prepare and give the pre-filled syringe or how to use the On-body Injector. Refer to the patient Instructions for Use for detailed instructions. Use exactly as directed. Take your medicine at regular intervals. Do not take your medicine more often than directed. It is important that you put your used needles and syringes in a special sharps container. Do not put them in a trash can. If you do not have a sharps container, call your pharmacist or healthcare provider to get one. Talk to your pediatrician regarding the use of this medicine in children. Special care may be needed. Overdosage: If you think you have taken too much of this medicine contact a poison control center or emergency room at once. NOTE: This medicine is only for you. Do not share this medicine with others. What if I miss a dose? It is important not to miss your dose. Call your doctor or health care professional if you miss your dose. If you miss a dose due to an On-body Injector failure or leakage, a new dose should be administered as soon as possible using a single prefilled syringe for manual use. What may interact with  this medicine? Interactions have not been studied. Give your health care provider a list of all the medicines, herbs, non-prescription drugs, or dietary supplements you use. Also  tell them if you smoke, drink alcohol, or use illegal drugs. Some items may interact with your medicine. This list may not describe all possible interactions. Give your health care provider a list of all the medicines, herbs, non-prescription drugs, or dietary supplements you use. Also tell them if you smoke, drink alcohol, or use illegal drugs. Some items may interact with your medicine. What should I watch for while using this medicine? You may need blood work done while you are taking this medicine. If you are going to need a MRI, CT scan, or other procedure, tell your doctor that you are using this medicine (On-Body Injector only). What side effects may I notice from receiving this medicine? Side effects that you should report to your doctor or health care professional as soon as possible: -allergic reactions like skin rash, itching or hives, swelling of the face, lips, or tongue -dizziness -fever -pain, redness, or irritation at site where injected -pinpoint red spots on the skin -shortness of breath or breathing problems -stomach or side pain, or pain at the shoulder -swelling -tiredness -trouble passing urine Side effects that usually do not require medical attention (report to your doctor or health care professional if they continue or are bothersome): -bone pain -muscle pain This list may not describe all possible side effects. Call your doctor for medical advice about side effects. You may report side effects to FDA at 1-800-FDA-1088. Where should I keep my medicine? Keep out of the reach of children. Store pre-filled syringes in a refrigerator between 2 and 8 degrees C (36 and 46 degrees F). Do not freeze. Keep in carton to protect from light. Throw away this medicine if it is left out of the refrigerator  for more than 48 hours. Throw away any unused medicine after the expiration date. NOTE: This sheet is a summary. It may not cover all possible information. If you have questions about this medicine, talk to your doctor, pharmacist, or health care provider.  2015, [employer]/Gold Standard. (2013-04-14 16:14:05) Palonosetron Injection What is this medicine? PALONOSETRON (pal oh NOE se tron) is used to prevent nausea and vomiting caused by chemotherapy. It also helps prevent delayed nausea and vomiting that may occur a few days after your treatment. This medicine may be used for other purposes; ask your health care provider or pharmacist if you have questions. COMMON BRAND NAME(S): Aloxi What should I tell my health care provider before I take this medicine? They need to know if you have any of these conditions: -an unusual or allergic reaction to palonosetron, dolasetron, granisetron, ondansetron, other medicines, foods, dyes, or preservatives -pregnant or trying to get pregnant -breast-feeding How should I use this medicine? This medicine is for infusion into a vein. It is given by a health care professional in a hospital or clinic setting. Talk to your pediatrician regarding the use of this medicine in children. While this drug may be prescribed for children as young as 1 month for selected conditions, precautions do apply. Overdosage: If you think you have taken too much of this medicine contact a poison control center or emergency room at once. NOTE: This medicine is only for you. Do not share this medicine with others. What if I miss a dose? This does not apply. What may interact with this medicine? -certain medicines for depression, anxiety, or psychotic disturbances -fentanyl -linezolid -MAOIs like Carbex, Eldepryl, Marplan, Nardil, and Parnate -methylene blue (injected into a vein) -tramadol This list may not describe all possible interactions. Give your health  care provider a list of  all the medicines, herbs, non-prescription drugs, or dietary supplements you use. Also tell them if you smoke, drink alcohol, or use illegal drugs. Some items may interact with your medicine. What should I watch for while using this medicine? Your condition will be monitored carefully while you are receiving this medicine. What side effects may I notice from receiving this medicine? Side effects that you should report to your doctor or health care professional as soon as possible: -allergic reactions like skin rash, itching or hives, swelling of the face, lips, or tongue -breathing problems -confusion -dizziness -fast, irregular heartbeat -fever and chills -loss of balance or coordination -seizures -sweating -swelling of the hands and feet -tremors -unusually weak or tired Side effects that usually do not require medical attention (report to your doctor or health care professional if they continue or are bothersome): -constipation or diarrhea -headache This list may not describe all possible side effects. Call your doctor for medical advice about side effects. You may report side effects to FDA at 1-800-FDA-1088. Where should I keep my medicine? This drug is given in a hospital or clinic and will not be stored at home. NOTE: This sheet is a summary. It may not cover all possible information. If you have questions about this medicine, talk to your doctor, pharmacist, or health care provider.  2015, Elsevier/Gold Standard. (2012-11-19 10:38:36) Fosaprepitant injection What is this medicine? FOSAPREPITANT (fos ap RE pi tant) is used together with other medicines to prevent nausea and vomiting caused by cancer treatment (chemotherapy). This medicine may be used for other purposes; ask your health care provider or pharmacist if you have questions. COMMON BRAND NAME(S): Emend What should I tell my health care provider before I take this medicine? They need to know if you have any of these  conditions: -liver disease -an unusual or allergic reaction to fosaprepitant, aprepitant, medicines, foods, dyes, or preservatives -pregnant or trying to get pregnant -breast-feeding How should I use this medicine? This medicine is for injection into a vein. It is given by a health care professional in a hospital or clinic setting. Talk to your pediatrician regarding the use of this medicine in children. Special care may be needed. Overdosage: If you think you have taken too much of this medicine contact a poison control center or emergency room at once. NOTE: This medicine is only for you. Do not share this medicine with others. What if I miss a dose? This does not apply. What may interact with this medicine? Do not take this medicine with any of these medicines: -cisapride -pimozide -ranolazine This medicine may also interact with the following medications: -diltiazem -female hormones, like estrogens or progestins and birth control pills -medicines for fungal infections like ketoconazole and itraconazole -medicines for HIV -medicines for seizures or to control epilepsy like carbamazepine or phenytoin -medicines used for sleep or anxiety disorders like alprazolam, diazepam, or midazolam -nefazodone -paroxetine -rifampin -some chemotherapy medications like etoposide, ifosfamide, vinblastine, vincristine -some antibiotics like clarithromycin, erythromycin, troleandomycin -steroid medicines like dexamethasone or methylprednisolone -tolbutamide -warfarin This list may not describe all possible interactions. Give your health care provider a list of all the medicines, herbs, non-prescription drugs, or dietary supplements you use. Also tell them if you smoke, drink alcohol, or use illegal drugs. Some items may interact with your medicine. What should I watch for while using this medicine? Do not take this medicine if you already have nausea and vomiting. Ask your health care provider what  to  do if you already have nausea. Birth control pills may not work properly while you are taking this medicine. Talk to your doctor about using an extra method of birth control. This medicine should not be used continuously for a long time. Visit your doctor or health care professional for regular check-ups. This medicine may change your liver function blood test results. What side effects may I notice from receiving this medicine? Side effects that you should report to your doctor or health care professional as soon as possible: -allergic reactions like skin rash, itching or hives, swelling of the face, lips, or tongue -breathing problems -changes in heart rhythm -high or low blood pressure -pain, redness, or irritation at site where injected -rectal bleeding -serious dizziness or disorientation, confusion -sharp or severe stomach pain -sharp pain in your leg Side effects that usually do not require medical attention (report to your doctor or health care professional if they continue or are bothersome): -constipation or diarrhea -hair loss -headache -hiccups -loss of appetite -nausea -upset stomach -tiredness This list may not describe all possible side effects. Call your doctor for medical advice about side effects. You may report side effects to FDA at 1-800-FDA-1088. Where should I keep my medicine? This drug is given in a hospital or clinic and will not be stored at home. NOTE: This sheet is a summary. It may not cover all possible information. If you have questions about this medicine, talk to your doctor, pharmacist, or health care provider.  2015, Elsevier/Gold Standard. (2008-12-26 12:46:13) Dexamethasone injection What is this medicine? DEXAMETHASONE (dex a METH a sone) is a corticosteroid. It is used to treat inflammation of the skin, joints, lungs, and other organs. Common conditions treated include asthma, allergies, and arthritis. It is also used for other conditions,  like blood disorders and diseases of the adrenal glands. This medicine may be used for other purposes; ask your health care provider or pharmacist if you have questions. COMMON BRAND NAME(S): Decadron, Solurex What should I tell my health care provider before I take this medicine? They need to know if you have any of these conditions: -blood clotting problems -Cushing's syndrome -diabetes -glaucoma -heart problems or disease -high blood pressure -infection like herpes, measles, tuberculosis, or chickenpox -kidney disease -liver disease -mental problems -myasthenia gravis -osteoporosis -previous heart attack -seizures -stomach, ulcer or intestine disease including colitis and diverticulitis -thyroid problem -an unusual or allergic reaction to dexamethasone, corticosteroids, other medicines, lactose, foods, dyes, or preservatives -pregnant or trying to get pregnant -breast-feeding How should I use this medicine? This medicine is for injection into a muscle, joint, lesion, soft tissue, or vein. It is given by a health care professional in a hospital or clinic setting. Talk to your pediatrician regarding the use of this medicine in children. Special care may be needed. Overdosage: If you think you have taken too much of this medicine contact a poison control center or emergency room at once. NOTE: This medicine is only for you. Do not share this medicine with others. What if I miss a dose? This may not apply. If you are having a series of injections over a prolonged period, try not to miss an appointment. Call your doctor or health care professional to reschedule if you are unable to keep an appointment. What may interact with this medicine? Do not take this medicine with any of the following medications: -mifepristone, RU-486 -vaccines This medicine may also interact with the following medications: -amphotericin B -antibiotics like clarithromycin, erythromycin, and  troleandomycin -  aspirin and aspirin-like drugs -barbiturates like phenobarbital -carbamazepine -cholestyramine -cholinesterase inhibitors like donepezil, galantamine, rivastigmine, and tacrine -cyclosporine -digoxin -diuretics -ephedrine -female hormones, like estrogens or progestins and birth control pills -indinavir -isoniazid -ketoconazole -medicines for diabetes -medicines that improve muscle tone or strength for conditions like myasthenia gravis -NSAIDs, medicines for pain and inflammation, like ibuprofen or naproxen -phenytoin -rifampin -thalidomide -warfarin This list may not describe all possible interactions. Give your health care provider a list of all the medicines, herbs, non-prescription drugs, or dietary supplements you use. Also tell them if you smoke, drink alcohol, or use illegal drugs. Some items may interact with your medicine. What should I watch for while using this medicine? Your condition will be monitored carefully while you are receiving this medicine. If you are taking this medicine for a long time, carry an identification card with your name and address, the type and dose of your medicine, and your doctor's name and address. This medicine may increase your risk of getting an infection. Stay away from people who are sick. Tell your doctor or health care professional if you are around anyone with measles or chickenpox. Talk to your health care provider before you get any vaccines that you take this medicine. If you are going to have surgery, tell your doctor or health care professional that you have taken this medicine within the last twelve months. Ask your doctor or health care professional about your diet. You may need to lower the amount of salt you eat. The medicine can increase your blood sugar. If you are a diabetic check with your doctor if you need help adjusting the dose of your diabetic medicine. What side effects may I notice from receiving this  medicine? Side effects that you should report to your doctor or health care professional as soon as possible: -allergic reactions like skin rash, itching or hives, swelling of the face, lips, or tongue -black or tarry stools -change in the amount of urine -changes in vision -confusion, excitement, restlessness, a false sense of well-being -fever, sore throat, sneezing, cough, or other signs of infection, wounds that will not heal -hallucinations -increased thirst -mental depression, mood swings, mistaken feelings of self importance or of being mistreated -pain in hips, back, ribs, arms, shoulders, or legs -pain, redness, or irritation at the injection site -redness, blistering, peeling or loosening of the skin, including inside the mouth -rounding out of face -swelling of feet or lower legs -unusual bleeding or bruising -unusual tired or weak -wounds that do not heal Side effects that usually do not require medical attention (report to your doctor or health care professional if they continue or are bothersome): -diarrhea or constipation -change in taste -headache -nausea, vomiting -skin problems, acne, thin and shiny skin -touble sleeping -unusual growth of hair on the face or body -weight gain This list may not describe all possible side effects. Call your doctor for medical advice about side effects. You may report side effects to FDA at 1-800-FDA-1088. Where should I keep my medicine? This drug is given in a hospital or clinic and will not be stored at home. NOTE: This sheet is a summary. It may not cover all possible information. If you have questions about this medicine, talk to your doctor, pharmacist, or health care provider.  2015, Elsevier/Gold Standard. (2007-05-06 14:04:12) Acetaminophen tablets or caplets What is this medicine? ACETAMINOPHEN (a set a MEE noe fen) is a pain reliever. It is used to treat mild pain and fever. This medicine may be used  for other purposes;  ask your health care provider or pharmacist if you have questions. COMMON BRAND NAME(S): Aceta, Actamin, Anacin Aspirin Free, Genapap, Genebs, Mapap, Pain & Fever, Pain and Fever, PAIN RELIEF, PAIN RELIEF Extra Strength, Pain Reliever, Panadol, PHARBETOL, Q-Pap, Q-Pap Extra Strength, Tylenol, Tylenol CrushableTablet, Tylenol Extra Strength, XS No Aspirin, XS Pain Reliever What should I tell my health care provider before I take this medicine? They need to know if you have any of these conditions: -if you often drink alcohol -liver disease -an unusual or allergic reaction to acetaminophen, other medicines, foods, dyes, or preservatives -pregnant or trying to get pregnant -breast-feeding How should I use this medicine? Take this medicine by mouth with a glass of water. Follow the directions on the package or prescription label. Take your medicine at regular intervals. Do not take your medicine more often than directed. Talk to your pediatrician regarding the use of this medicine in children. While this drug may be prescribed for children as young as 59 years of age for selected conditions, precautions do apply. Overdosage: If you think you have taken too much of this medicine contact a poison control center or emergency room at once. NOTE: This medicine is only for you. Do not share this medicine with others. What if I miss a dose? If you miss a dose, take it as soon as you can. If it is almost time for your next dose, take only that dose. Do not take double or extra doses. What may interact with this medicine? -alcohol -imatinib -isoniazid -other medicines with acetaminophen This list may not describe all possible interactions. Give your health care provider a list of all the medicines, herbs, non-prescription drugs, or dietary supplements you use. Also tell them if you smoke, drink alcohol, or use illegal drugs. Some items may interact with your medicine. What should I watch for while using this  medicine? Tell your doctor or health care professional if the pain lasts more than 10 days (5 days for children), if it gets worse, or if there is a new or different kind of pain. Also, check with your doctor if a fever lasts for more than 3 days. Do not take other medicines that contain acetaminophen with this medicine. Always read labels carefully. If you have questions, ask your doctor or pharmacist. If you take too much acetaminophen get medical help right away. Too much acetaminophen can be very dangerous and cause liver damage. Even if you do not have symptoms, it is important to get help right away. What side effects may I notice from receiving this medicine? Side effects that you should report to your doctor or health care professional as soon as possible: -allergic reactions like skin rash, itching or hives, swelling of the face, lips, or tongue -breathing problems -fever or sore throat -redness, blistering, peeling or loosening of the skin, including inside the mouth -trouble passing urine or change in the amount of urine -unusual bleeding or bruising -unusually weak or tired -yellowing of the eyes or skin Side effects that usually do not require medical attention (report to your doctor or health care professional if they continue or are bothersome): -headache -nausea, stomach upset This list may not describe all possible side effects. Call your doctor for medical advice about side effects. You may report side effects to FDA at 1-800-FDA-1088. Where should I keep my medicine? Keep out of reach of children. Store at room temperature between 20 and 25 degrees C (68 and 77 degrees F). Protect  from moisture and heat. Throw away any unused medicine after the expiration date. NOTE: This sheet is a summary. It may not cover all possible information. If you have questions about this medicine, talk to your doctor, pharmacist, or health care provider.  2015, Elsevier/Gold Standard. (2012-09-06  12:54:16) Implanted [facility] Guide An implanted port is a type of central line that is placed under the skin. Central lines are used to provide IV access when treatment or nutrition needs to be given through a person's veins. Implanted ports are used for long-term IV access. An implanted port may be placed because:   You need IV medicine that would be irritating to the small veins in your hands or arms.   You need long-term IV medicines, such as antibiotics.   You need IV nutrition for a long period.   You need frequent blood draws for lab tests.   You need dialysis.  Implanted ports are usually placed in the chest area, but they can also be placed in the upper arm, the abdomen, or the leg. An implanted port has two main parts:   Reservoir. The reservoir is round and will appear as a small, raised area under your skin. The reservoir is the part where a needle is inserted to give medicines or draw blood.   Catheter. The catheter is a thin, flexible tube that extends from the reservoir. The catheter is placed into a large vein. Medicine that is inserted into the reservoir goes into the catheter and then into the vein.  HOW WILL I CARE FOR MY INCISION SITE? Do not get the incision site wet. Bathe or shower as directed by your health care provider.  HOW IS MY PORT ACCESSED? Special steps must be taken to access the port:   Before the port is accessed, a numbing cream can be placed on the skin. This helps numb the skin over the port site.   Your health care provider uses a sterile technique to access the port.  Your health care provider must put on a mask and sterile gloves.  The skin over your port is cleaned carefully with an antiseptic and allowed to dry.  The port is gently pinched between sterile gloves, and a needle is inserted into the port.  Only "non-coring" port needles should be used to access the port. Once the port is accessed, a blood return should be checked. This  helps ensure that the port is in the vein and is not clogged.   If your port needs to remain accessed for a constant infusion, a clear (transparent) bandage will be placed over the needle site. The bandage and needle will need to be changed every week, or as directed by your health care provider.   Keep the bandage covering the needle clean and dry. Do not get it wet. Follow your health care provider's instructions on how to take a shower or bath while the port is accessed.   If your port does not need to stay accessed, no bandage is needed over the port.  WHAT IS FLUSHING? Flushing helps keep the port from getting clogged. Follow your health care provider's instructions on how and when to flush the port. Ports are usually flushed with saline solution or a medicine called heparin. The need for flushing will depend on how the port is used.   If the port is used for intermittent medicines or blood draws, the port will need to be flushed:   After medicines have been given.  After blood has been drawn.   As part of routine maintenance.   If a constant infusion is running, the port may not need to be flushed.  HOW LONG WILL MY PORT STAY IMPLANTED? The port can stay in for as long as your health care provider thinks it is needed. When it is time for the port to come out, surgery will be done to remove it. The procedure is similar to the one performed when the port was put in.  WHEN SHOULD I SEEK IMMEDIATE MEDICAL CARE? When you have an implanted port, you should seek immediate medical care if:   You notice a bad smell coming from the incision site.   You have swelling, redness, or drainage at the incision site.   You have more swelling or pain at the port site or the surrounding area.   You have a fever that is not controlled with medicine. Document Released: 01/13/2005 Document Revised: 11/03/2012 Document Reviewed: 09/20/2012 [facility] Patient Information 2015 [place], [place].  This information is not intended to replace advice given to you by your health care provider. Make sure you discuss any questions you have with your health care provider. Lidocaine; Prilocaine cream What is this medicine? LIDOCAINE; PRILOCAINE (LYE doe kane; PRIL oh kane) is a topical anesthetic that causes loss of feeling in the skin and surrounding tissues. It is used to numb the skin before procedures or injections. This medicine may be used for other purposes; ask your health care provider or pharmacist if you have questions. COMMON BRAND NAME(S): EMLA What should I tell my health care provider before I take this medicine? They need to know if you have any of these conditions: -glucose-6-phosphate deficiencies -heart disease -kidney or liver disease -methemoglobinemia -an unusual or allergic reaction to lidocaine, prilocaine, other medicines, foods, dyes, or preservatives -pregnant or trying to get pregnant -breast-feeding How should I use this medicine? This medicine is for external use only on the skin. Do not take by mouth. Follow the directions on the prescription label. Wash hands before and after use. Do not use more or leave in contact with the skin longer than directed. Do not apply to eyes or open wounds. It can cause irritation and blurred or temporary loss of vision. If this medicine comes in contact with your eyes, immediately rinse the eye with water. Do not touch or rub the eye. Contact your health care provider right away. Talk to your pediatrician regarding the use of this medicine in children. While this medicine may be prescribed for children for selected conditions, precautions do apply. Overdosage: If you think you have taken too much of this medicine contact a poison control center or emergency room at once. NOTE: This medicine is only for you. Do not share this medicine with others. What if I miss a dose? This medicine is usually only applied once prior to each  procedure. It must be in contact with the skin for a period of time for it to work. If you applied this medicine later than directed, tell your health care professional before starting the procedure. What may interact with this medicine? -acetaminophen -chloroquine -dapsone -medicines to control heart rhythm -nitrates like nitroglycerin and nitroprusside -other ointments, creams, or sprays that may contain anesthetic medicine -phenobarbital -phenytoin -quinine -sulfonamides like sulfacetamide, sulfamethoxazole, sulfasalazine and others This list may not describe all possible interactions. Give your health care provider a list of all the medicines, herbs, non-prescription drugs, or dietary supplements you use. Also tell them if  you smoke, drink alcohol, or use illegal drugs. Some items may interact with your medicine. What should I watch for while using this medicine? Be careful to avoid injury to the treated area while it is numb and you are not aware of pain. Avoid scratching, rubbing, or exposing the treated area to hot or cold temperatures until complete sensation has returned. The numb feeling will wear off a few hours after applying the cream. What side effects may I notice from receiving this medicine? Side effects that you should report to your doctor or health care professional as soon as possible: -blurred vision -chest pain -difficulty breathing -dizziness -drowsiness -fast or irregular heartbeat -skin rash or itching -swelling of your throat, lips, or face -trembling Side effects that usually do not require medical attention (report to your doctor or health care professional if they continue or are bothersome): -changes in ability to feel hot or cold -redness and swelling at the application site This list may not describe all possible side effects. Call your doctor for medical advice about side effects. You may report side effects to FDA at 1-800-FDA-1088. Where should I keep  my medicine? Keep out of reach of children. Store at room temperature between 15 and 30 degrees C (59 and 86 degrees F). Keep container tightly closed. Throw away any unused medicine after the expiration date. NOTE: This sheet is a summary. It may not cover all possible information. If you have questions about this medicine, talk to your doctor, pharmacist, or health care provider.  2015, Elsevier/Gold Standard. (2007-07-19 17:14:35) Loratadine tablets What is this medicine? LORATADINE (lor AT a deen) is an antihistamine. It helps to relieve sneezing, runny nose, and itchy, watery eyes. This medicine is used to treat the symptoms of allergies. It is also used to treat itchy skin rash and hives. This medicine may be used for other purposes; ask your health care provider or pharmacist if you have questions. COMMON BRAND NAME(S): Alavert, Allergy Relief, Claritin, Claritin Hives Relief, Clear-Atadine, QlearQuil All Day & All Night Allergy Relief, Tavist ND What should I tell my health care provider before I take this medicine? They need to know if you have any of these conditions: -asthma -kidney disease -liver disease -an unusual or allergic reaction to loratadine, other antihistamines, other medicines, foods, dyes, or preservatives -pregnant or trying to get pregnant -breast-feeding How should I use this medicine? Take this medicine by mouth with a glass of water. Follow the directions on the label. You may take this medicine with food or on an empty stomach. Take your medicine at regular intervals. Do not take your medicine more often than directed. Talk to your pediatrician regarding the use of this medicine in children. While this medicine may be used in children as young as 6 years for selected conditions, precautions do apply. Overdosage: If you think you have taken too much of this medicine contact a poison control center or emergency room at once. NOTE: This medicine is only for you. Do  not share this medicine with others. What if I miss a dose? If you miss a dose, take it as soon as you can. If it is almost time for your next dose, take only that dose. Do not take double or extra doses. What may interact with this medicine? -other medicines for colds or allergies This list may not describe all possible interactions. Give your health care provider a list of all the medicines, herbs, non-prescription drugs, or dietary supplements you use. Also tell  them if you smoke, drink alcohol, or use illegal drugs. Some items may interact with your medicine. What should I watch for while using this medicine? Tell your doctor or healthcare professional if your symptoms do not start to get better or if they get worse. Your mouth may get dry. Chewing sugarless gum or sucking hard candy, and drinking plenty of water may help. Contact your doctor if the problem does not go away or is severe. You may get drowsy or dizzy. Do not drive, use machinery, or do anything that needs mental alertness until you know how this medicine affects you. Do not stand or sit up quickly, especially if you are an older patient. This reduces the risk of dizzy or fainting spells. What side effects may I notice from receiving this medicine? Side effects that you should report to your doctor or health care professional as soon as possible: -allergic reactions like skin rash, itching or hives, swelling of the face, lips, or tongue -breathing problems -unusually restless or nervous Side effects that usually do not require medical attention (report to your doctor or health care professional if they continue or are bothersome): -drowsiness -dry or irritated mouth or throat -headache This list may not describe all possible side effects. Call your doctor for medical advice about side effects. You may report side effects to FDA at 1-800-FDA-1088. Where should I keep my medicine? Keep out of the reach of children. Store at room  temperature between 2 and 30 degrees C (36 and 86 degrees F). Protect from moisture. Throw away any unused medicine after the expiration date. NOTE: This sheet is a summary. It may not cover all possible information. If you have questions about this medicine, talk to your doctor, pharmacist, or health care provider.  2015, Elsevier/Gold Standard. (2007-07-19 17:17:24) Acyclovir tablets or capsules What is this medicine? ACYCLOVIR (ay SYE kloe veer) is an antiviral medicine. It is used to treat or prevent infections caused by certain kinds of viruses. Examples of these infections include herpes and shingles. This medicine will not cure herpes. This medicine may be used for other purposes; ask your health care provider or pharmacist if you have questions. COMMON BRAND NAME(S): Zovirax What should I tell my health care provider before I take this medicine? They need to know if you have any of these conditions: -kidney disease -an unusual or allergic reaction to acyclovir, ganciclovir, valacyclovir, other medicines, foods, dyes, or preservatives -pregnant or trying to get pregnant -breast-feeding How should I use this medicine? Take this medicine by mouth with a glass of water. Follow the directions on the prescription label. You can take it with or without food. Take your medicine at regular intervals. Do not take your medicine more often than directed. Take all of your medicine as directed even if you think your are better. Do not skip doses or stop your medicine early. Talk to your pediatrician regarding the use of this medicine in children. While this drug may be prescribed for selected conditions, precautions do apply. Overdosage: If you think you have taken too much of this medicine contact a poison control center or emergency room at once. NOTE: This medicine is only for you. Do not share this medicine with others. What if I miss a dose? If you miss a dose, take it as soon as you can. If it  is almost time for your next dose, take only that dose. Do not take double or extra doses. What may interact with this medicine? -probenecid  This list may not describe all possible interactions. Give your health care provider a list of all the medicines, herbs, non-prescription drugs, or dietary supplements you use. Also tell them if you smoke, drink alcohol, or use illegal drugs. Some items may interact with your medicine. What should I watch for while using this medicine? Tell your doctor or health care professional if your symptoms do not improve. This medicine works best when started very early in the course of an infection. Begin treatment at the first signs of infection. Drink 6 to 8 glasses of water or fluids every day while you are taking this medicine. This will help prevent side effects. You can still pass chickenpox, shingles, or herpes to another person even while you are taking this medicine. Avoid contact with others as directed. Genital herpes is a sexually transmitted disease. Talk to your doctor about how to stop the spread of infection. What side effects may I notice from receiving this medicine? Side effects that you should report to your doctor or health care professional as soon as possible: -allergic reactions like skin rash, itching or hives, swelling of the face, lips, or tongue -chest pain -confusion, hallucinations, tremor -dark urine -increased sensitivity to the sun -redness, blistering, peeling or loosening of the skin, including inside the mouth -seizures -trouble passing urine or change in the amount of urine -unusual bleeding or bruising, or pinpoint red spots on the skin -unusually weak or tired -yellowing of the eyes or skin Side effects that usually do not require medical attention (report to your doctor or health care professional if they continue or are bothersome): -diarrhea -fever -headache -nausea, vomiting -stomach upset This list may not describe  all possible side effects. Call your doctor for medical advice about side effects. You may report side effects to FDA at 1-800-FDA-1088. Where should I keep my medicine? Keep out of the reach of children. Store at room temperature between 15 and 25 degrees C (59 and 77 degrees F). Throw away any unused medicine after the expiration date. NOTE: This sheet is a summary. It may not cover all possible information. If you have questions about this medicine, talk to your doctor, pharmacist, or health care provider.  2015, Elsevier/Gold Standard. (2007-03-31 13:15:46)

## 2013-10-14 ENCOUNTER — Telehealth (HOSPITAL_COMMUNITY): Payer: Self-pay | Admitting: *Deleted

## 2013-10-14 ENCOUNTER — Encounter (HOSPITAL_BASED_OUTPATIENT_CLINIC_OR_DEPARTMENT_OTHER): Payer: Medicare Other

## 2013-10-14 ENCOUNTER — Telehealth: Payer: Self-pay | Admitting: *Deleted

## 2013-10-14 ENCOUNTER — Ambulatory Visit (INDEPENDENT_AMBULATORY_CARE_PROVIDER_SITE_OTHER): Payer: Medicare Other | Admitting: *Deleted

## 2013-10-14 DIAGNOSIS — I482 Chronic atrial fibrillation, unspecified: Secondary | ICD-10-CM

## 2013-10-14 DIAGNOSIS — C8581 Other specified types of non-Hodgkin lymphoma, lymph nodes of head, face, and neck: Secondary | ICD-10-CM | POA: Diagnosis not present

## 2013-10-14 DIAGNOSIS — I4891 Unspecified atrial fibrillation: Secondary | ICD-10-CM

## 2013-10-14 DIAGNOSIS — C8580 Other specified types of non-Hodgkin lymphoma, unspecified site: Secondary | ICD-10-CM

## 2013-10-14 DIAGNOSIS — Z7901 Long term (current) use of anticoagulants: Secondary | ICD-10-CM

## 2013-10-14 DIAGNOSIS — Z5181 Encounter for therapeutic drug level monitoring: Secondary | ICD-10-CM

## 2013-10-14 DIAGNOSIS — C8589 Other specified types of non-Hodgkin lymphoma, extranodal and solid organ sites: Secondary | ICD-10-CM | POA: Diagnosis not present

## 2013-10-14 LAB — PROTIME-INR
INR: 1.38 (ref 0.00–1.49)
Prothrombin Time: 17 seconds — ABNORMAL HIGH (ref 11.6–15.2)

## 2013-10-14 LAB — HEPATITIS B SURFACE ANTIGEN: Hepatitis B Surface Ag: NEGATIVE

## 2013-10-14 LAB — FERRITIN: Ferritin: 16 ng/mL (ref 10–291)

## 2013-10-14 LAB — HEPATITIS B CORE ANTIBODY, IGM: Hep B C IgM: NONREACTIVE

## 2013-10-14 MED ORDER — ACYCLOVIR 400 MG PO TABS: 400.0000 mg | ORAL_TABLET | Freq: Every day | ORAL | Status: DC

## 2013-10-14 NOTE — Progress Notes (Signed)
Current lab results faxed to Dr.Bradford's office and Dr.Rothbart's office per PA request.

## 2013-10-14 NOTE — Addendum Note (Signed)
Addended by: Gerhard Perches on: 10/14/2013 12:18 PM   Modules accepted: Orders

## 2013-10-14 NOTE — Progress Notes (Signed)
Labs for hep b c ag, hep b s ag, pt/inr

## 2013-10-14 NOTE — Addendum Note (Signed)
Addended by: Gerhard Perches on: 10/14/2013 12:15 PM   Modules accepted: Orders

## 2013-10-14 NOTE — Progress Notes (Signed)
Pt's current labs faxed to Dr. Hulan Amato office and Cardiology was notified of current lab results.  Cardiology will notify the patient.

## 2013-10-14 NOTE — Telephone Encounter (Signed)
PT INR TODAY WAS 1.38 PT 17.0.

## 2013-10-14 NOTE — Telephone Encounter (Signed)
Patient's daughter Peter Congo contacted regarding the questions she asked in chemo teaching earlier today. Dr. Barnet Glasgow said it was ok for patient to receive the flu shot but wanted her to do it prior to chemotherapy starting on 10/24/13. I will place this order under signed and held. Also, Dr. Barnet Glasgow said that we could check the ferritin level of patient since she is eating 2-4 tall cups of ice a day. I will put the order in for the ferritin but it will need to be linked to the appt date. Patient may want to be scheduled the week of 9/21 for a ferritin level check and flu shot.

## 2013-10-14 NOTE — Progress Notes (Signed)
Chemo consent signed for Rituxan and Bendamustine. Med calendar to be mailed out to patient. What to Know During Chemo and What to Know After Chemo papers given to patient and 2 attending family members. Questions submitted to Dr. Barnet Glasgow for answering. I will contact patient's daughter-in-law with answers after while. Meds already called in to [facility].

## 2013-10-14 NOTE — Telephone Encounter (Signed)
See coumadin note. 

## 2013-10-17 ENCOUNTER — Other Ambulatory Visit (HOSPITAL_COMMUNITY): Payer: Self-pay | Admitting: Hematology and Oncology

## 2013-10-17 DIAGNOSIS — E611 Iron deficiency: Secondary | ICD-10-CM | POA: Insufficient documentation

## 2013-10-18 ENCOUNTER — Encounter (HOSPITAL_COMMUNITY): Payer: Self-pay | Admitting: Pharmacy Technician

## 2013-10-18 NOTE — Patient Instructions (Signed)
Your procedure is scheduled on: 10/21/2013  Report to [facility] at    11:15 AM.  Call this number if you have problems the morning of surgery: [phone]   Remember:   Do not drink or eat food:After Midnight.  :  Take these medicines the morning of surgery with A SIP OF WATER: Xanax, Celexa, Zyrtec, Digoxin, Diltazem, and metoprolol   Do not wear jewelry, make-up or nail polish.  Do not wear lotions, powders, or perfumes. You may wear deodorant.  Do not shave 48 hours prior to surgery. Men may shave face and neck.  Do not bring valuables to the hospital.  Contacts, dentures or bridgework may not be worn into surgery.  Leave suitcase in the car. After surgery it may be brought to your room.  For patients admitted to the hospital, checkout time is 11:00 AM the day of discharge.   Patients discharged the day of surgery will not be allowed to drive home.    Special Instructions: Shower using CHG night before surgery and shower the day of surgery use CHG.  Use special wash - you have one bottle of CHG for all showers.  You should use approximately 1/2 of the bottle for each shower.   Please read over the following fact sheets that you were given: Pain Booklet, MRSA Information, Surgical Site Infection Prevention and Care and Recovery After Surgery  Implanted Port Insertion, Care After Refer to this sheet in the next few weeks. These instructions provide you with information on caring for yourself after your procedure. Your health care provider may also give you more specific instructions. Your treatment has been planned according to current medical practices, but problems sometimes occur. Call your health care provider if you have any problems or questions after your procedure. WHAT TO EXPECT AFTER THE PROCEDURE After your procedure, it is typical to have the following:   Discomfort at the port insertion site. Ice packs to the area will help.  Bruising on the skin over the port. This will  subside in 3-4 days. HOME CARE INSTRUCTIONS  After your port is placed, you will get a manufacturer's information card. The card has information about your port. Keep this card with you at all times.   Know what kind of port you have. There are many types of ports available.   Wear a medical alert bracelet in case of an emergency. This can help alert health care workers that you have a port.   The port can stay in for as long as your health care provider believes it is necessary.   A home health care nurse may give medicines and take care of the port.   You or a family member can get special training and directions for giving medicine and taking care of the port at home.  SEEK MEDICAL CARE IF:   Your port does not flush or you are unable to get a blood return.   You have a fever or chills. SEEK IMMEDIATE MEDICAL CARE IF:  You have new fluid or pus coming from your incision.   You notice a bad smell coming from your incision site.   You have swelling, pain, or more redness at the incision or port site.   You have chest pain or shortness of breath. Document Released: 11/03/2012 Document Revised: 01/18/2013 Document Reviewed: 11/03/2012 [facility] Patient Information 2015 [place], [place]. This information is not intended to replace advice given to you by your health care provider. Make sure you discuss  any questions you have with your health care provider. Monitored Anesthesia Care Monitored anesthesia care is an anesthesia service for a medical procedure. Anesthesia is the loss of the ability to feel pain. It is produced by medicines called anesthetics. It may affect a small area of your body (local anesthesia), a large area of your body (regional anesthesia), or your entire body (general anesthesia). The need for monitored anesthesia care depends your procedure, your condition, and the potential need for regional or general anesthesia. It is often provided during procedures  where:   General anesthesia may be needed if there are complications. This is because you need special care when you are under general anesthesia.   You will be under local or regional anesthesia. This is so that you are able to have higher levels of anesthesia if needed.   You will receive calming medicines (sedatives). This is especially the case if sedatives are given to put you in a semi-conscious state of relaxation (deep sedation). This is because the amount of sedative needed to produce this state can be hard to predict. Too much of a sedative can produce general anesthesia. Monitored anesthesia care is performed by one or more health care providers who have special training in all types of anesthesia. You will need to meet with these health care providers before your procedure. During this meeting, they will ask you about your medical history. They will also give you instructions to follow. (For example, you will need to stop eating and drinking before your procedure. You may also need to stop or change medicines you are taking.) During your procedure, your health care providers will stay with you. They will:   Watch your condition. This includes watching your blood pressure, breathing, and level of pain.   Diagnose and treat problems that occur.   Give medicines if they are needed. These may include calming medicines (sedatives) and anesthetics.   Make sure you are comfortable.  Having monitored anesthesia care does not necessarily mean that you will be under anesthesia. It does mean that your health care providers will be able to manage anesthesia if you need it or if it occurs. It also means that you will be able to have a different type of anesthesia than you are having if you need it. When your procedure is complete, your health care providers will continue to watch your condition. They will make sure any medicines wear off before you are allowed to go home.  Document Released:  10/09/2004 Document Revised: 05/30/2013 Document Reviewed: 02/25/2012 [facility] Patient Information 2015 [place], [place]. This information is not intended to replace advice given to you by your health care provider. Make sure you discuss any questions you have with your health care provider.

## 2013-10-19 ENCOUNTER — Encounter (HOSPITAL_COMMUNITY)
Admission: RE | Admit: 2013-10-19 | Discharge: 2013-10-19 | Disposition: A | Discharge status: Home / Self Care | Payer: Medicare Other | Source: Ambulatory Visit | Attending: General Surgery | Admitting: General Surgery

## 2013-10-19 ENCOUNTER — Encounter (HOSPITAL_COMMUNITY): Payer: Self-pay

## 2013-10-19 DIAGNOSIS — I10 Essential (primary) hypertension: Secondary | ICD-10-CM | POA: Diagnosis not present

## 2013-10-19 DIAGNOSIS — C8581 Other specified types of non-Hodgkin lymphoma, lymph nodes of head, face, and neck: Secondary | ICD-10-CM | POA: Diagnosis not present

## 2013-10-19 DIAGNOSIS — F411 Generalized anxiety disorder: Secondary | ICD-10-CM | POA: Diagnosis not present

## 2013-10-19 DIAGNOSIS — Z79899 Other long term (current) drug therapy: Secondary | ICD-10-CM | POA: Diagnosis not present

## 2013-10-19 DIAGNOSIS — Z0189 Encounter for other specified special examinations: Secondary | ICD-10-CM | POA: Diagnosis not present

## 2013-10-19 DIAGNOSIS — C969 Malignant neoplasm of lymphoid, hematopoietic and related tissue, unspecified: Secondary | ICD-10-CM | POA: Diagnosis not present

## 2013-10-19 DIAGNOSIS — F17200 Nicotine dependence, unspecified, uncomplicated: Secondary | ICD-10-CM | POA: Diagnosis not present

## 2013-10-19 NOTE — Pre-Procedure Instructions (Signed)
Patient given information to sign up for my chart at home. 

## 2013-10-19 NOTE — H&P (Signed)
NAME:  Marissa Turner, Marissa Turner NO.:  [id number]  MEDICAL RECORD NO.:  [id number]  LOCATION:  DOIB                          FACILITY:  [facility]  PHYSICIAN:  Felicie Morn, M.D. 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  She has been referred back to [place] for placement of a Port-A-Cath so that she can resume chemotherapy.  PAST MEDICAL HISTORY:  Positive for atrial fibrillation, hypertension, anxiety, and tobacco abuse.  PAST SURGERIES:  Have been fairly extensive.  She has had an abdominal hysterectomy in 1982, appendectomy sometime in the 1970s, and she underwent an antrectomy, vagotomy, and an incidental splenectomy in 1988.  In the past, she has also had bilateral mastectomies and biopsies for benign disease.  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  She was cleared for surgery medically, and we will plan for surgery on her this Friday.  She will then be off Coumadin for at least 5 days.  She has also cut down on her smoking which unfortunately she still continues to have it at least 5 cigarettes a day.  She is a nondrinker, however.  For rest of her medications, please consult medication chart.  ALLERGIES:  She is allergic to PENICILLIN as well as KEFLEX.  PHYSICAL EXAMINATION:  VITAL SIGNS:  She is 5 feet tall, she weighs 122 pounds, temperature is 98.2, pulse rate 60, respirations 12, blood pressure 110/62. HEENT:  Head is normocephalic.  Eyes, extraocular movements are intact. Pupils are round and reactive to light and accommodation.  There are no bruits appreciated.  No cervical adenopathy.  At present, she has a healing scar from her last  cervical node biopsy.  She has a dental prosthesis. BREASTS:  Breasts and axillae are without masses. ABDOMEN:  Soft, without any hernias. RECTAL:  Deferred. PELVIC:  Deferred. EXTREMITIES:  She has chronic leg and back pain and she limps.  She has a past history of a fractured hip which was not treated surgically.  REVIEW OF SYSTEMS:  NEURO:  No history of migraines or seizures or any lateralizing neurological findings.  ENDOCRINE:  No history of diabetes, thyroid disease, or adrenal problems.  CARDIOPULMONARY:  History of atrial fibrillation, hypertension, tobacco abuse.  MUSCULOSKELETAL: History of fracture of left hip and she walks with a limp.  OB-GYN:  Her last menstrual period was 71.  She is a gravida 4, para 3, cesarean 1A0, abortus 0 female with a past family history of carcinoma of the breast.  Her last mammogram was in 2014.  GI:  No history of hepatitis, constipation, diarrhea, bright red rectal bleeding, black tarry stools, or melena.  No unexplained weight loss.  Her last colonoscopy was 2014 and she underwent in the past in 1988 an antrectomy and vagotomy for peptic ulcer disease.  GU:  No history of frequency, dysuria.  She has  had a remote history of kidney stones.  IMPRESSION:  Marissa Turner is a 73 year old female who was recently diagnosed with B-cell lymphoma and we will plan to place a Port-A-Cath. We discussed complications not limited to but including bleeding, infection, thrombosis, pneumothorax, and catheter embolization. Informed consent was obtained.  We will plan for this via the outpatient department and coordinate her Coumadin as suggested by the Medical Service.     Felicie Morn, M.D.     WB/MEDQ  D:  10/19/2013  T:  10/19/2013  Job:  [id number]

## 2013-10-21 ENCOUNTER — Ambulatory Visit (HOSPITAL_COMMUNITY): Payer: Medicare Other

## 2013-10-21 ENCOUNTER — Encounter (HOSPITAL_COMMUNITY)
Admission: RE | Disposition: A | Discharge status: Home / Self Care | Payer: Self-pay | Source: Ambulatory Visit | Attending: General Surgery

## 2013-10-21 ENCOUNTER — Other Ambulatory Visit: Payer: Self-pay | Admitting: Radiology

## 2013-10-21 ENCOUNTER — Ambulatory Visit (HOSPITAL_COMMUNITY)
Admission: RE | Admit: 2013-10-21 | Discharge: 2013-10-21 | Disposition: A | Discharge status: Home / Self Care | Payer: Medicare Other | Source: Ambulatory Visit | Attending: General Surgery | Admitting: General Surgery

## 2013-10-21 ENCOUNTER — Ambulatory Visit (HOSPITAL_COMMUNITY): Payer: Medicare Other | Admitting: Anesthesiology

## 2013-10-21 ENCOUNTER — Encounter (HOSPITAL_COMMUNITY): Payer: Medicare Other | Admitting: Anesthesiology

## 2013-10-21 ENCOUNTER — Encounter (HOSPITAL_COMMUNITY): Payer: Self-pay | Admitting: *Deleted

## 2013-10-21 DIAGNOSIS — F411 Generalized anxiety disorder: Secondary | ICD-10-CM | POA: Diagnosis not present

## 2013-10-21 DIAGNOSIS — I10 Essential (primary) hypertension: Secondary | ICD-10-CM | POA: Insufficient documentation

## 2013-10-21 DIAGNOSIS — J449 Chronic obstructive pulmonary disease, unspecified: Secondary | ICD-10-CM | POA: Diagnosis not present

## 2013-10-21 DIAGNOSIS — C969 Malignant neoplasm of lymphoid, hematopoietic and related tissue, unspecified: Secondary | ICD-10-CM | POA: Diagnosis not present

## 2013-10-21 DIAGNOSIS — I482 Chronic atrial fibrillation, unspecified: Secondary | ICD-10-CM

## 2013-10-21 DIAGNOSIS — M4800 Spinal stenosis, site unspecified: Secondary | ICD-10-CM

## 2013-10-21 DIAGNOSIS — Z452 Encounter for adjustment and management of vascular access device: Secondary | ICD-10-CM | POA: Diagnosis not present

## 2013-10-21 DIAGNOSIS — M47817 Spondylosis without myelopathy or radiculopathy, lumbosacral region: Secondary | ICD-10-CM

## 2013-10-21 DIAGNOSIS — C8580 Other specified types of non-Hodgkin lymphoma, unspecified site: Secondary | ICD-10-CM

## 2013-10-21 DIAGNOSIS — Z720 Tobacco use: Secondary | ICD-10-CM

## 2013-10-21 DIAGNOSIS — C8589 Other specified types of non-Hodgkin lymphoma, extranodal and solid organ sites: Secondary | ICD-10-CM | POA: Diagnosis not present

## 2013-10-21 DIAGNOSIS — E611 Iron deficiency: Secondary | ICD-10-CM

## 2013-10-21 DIAGNOSIS — K279 Peptic ulcer, site unspecified, unspecified as acute or chronic, without hemorrhage or perforation: Secondary | ICD-10-CM

## 2013-10-21 DIAGNOSIS — Z79899 Other long term (current) drug therapy: Secondary | ICD-10-CM | POA: Insufficient documentation

## 2013-10-21 DIAGNOSIS — F17200 Nicotine dependence, unspecified, uncomplicated: Secondary | ICD-10-CM | POA: Insufficient documentation

## 2013-10-21 DIAGNOSIS — Z5181 Encounter for therapeutic drug level monitoring: Secondary | ICD-10-CM

## 2013-10-21 DIAGNOSIS — Z7901 Long term (current) use of anticoagulants: Secondary | ICD-10-CM

## 2013-10-21 HISTORY — PX: PORTACATH PLACEMENT: SHX2246

## 2013-10-21 LAB — PROTIME-INR
INR: 1.03 (ref 0.00–1.49)
Prothrombin Time: 13.5 seconds (ref 11.6–15.2)

## 2013-10-21 SURGERY — INSERTION, TUNNELED CENTRAL VENOUS DEVICE, WITH PORT
Anesthesia: Monitor Anesthesia Care | Site: Chest | Laterality: Right

## 2013-10-21 MED ORDER — MIDAZOLAM HCL 2 MG/2ML IJ SOLN
INTRAMUSCULAR | Status: AC
  Filled 2013-10-21: qty 2

## 2013-10-21 MED ORDER — ONDANSETRON HCL 4 MG/2ML IJ SOLN
INTRAMUSCULAR | Status: AC
  Filled 2013-10-21: qty 2

## 2013-10-21 MED ORDER — PROPOFOL INFUSION 10 MG/ML OPTIME
INTRAVENOUS | Status: DC | PRN
  Administered 2013-10-21: 55 ug/kg/min via INTRAVENOUS

## 2013-10-21 MED ORDER — PROPOFOL 10 MG/ML IV EMUL
INTRAVENOUS | Status: AC
  Filled 2013-10-21: qty 20

## 2013-10-21 MED ORDER — DEXTROSE 5 % IV SOLN
INTRAVENOUS | Status: DC | PRN
  Administered 2013-10-21: 14:00:00 via INTRAVENOUS

## 2013-10-21 MED ORDER — BACITRACIN ZINC 500 UNIT/GM EX OINT
TOPICAL_OINTMENT | CUTANEOUS | Status: AC
  Filled 2013-10-21: qty 0.9

## 2013-10-21 MED ORDER — SODIUM CHLORIDE 0.9 % IR SOLN
Status: DC | PRN
  Administered 2013-10-21: 15:00:00

## 2013-10-21 MED ORDER — HEPARIN SODIUM (PORCINE) 1000 UNIT/ML IJ SOLN
INTRAMUSCULAR | Status: AC
  Filled 2013-10-21: qty 1

## 2013-10-21 MED ORDER — CIPROFLOXACIN IN D5W 200 MG/100ML IV SOLN
INTRAVENOUS | Status: AC
  Filled 2013-10-21: qty 100

## 2013-10-21 MED ORDER — FENTANYL CITRATE 0.05 MG/ML IJ SOLN
INTRAMUSCULAR | Status: AC
  Filled 2013-10-21: qty 2

## 2013-10-21 MED ORDER — FENTANYL CITRATE 0.05 MG/ML IJ SOLN: 25.0000 ug | INTRAMUSCULAR | Status: DC | PRN

## 2013-10-21 MED ORDER — FENTANYL CITRATE 0.05 MG/ML IJ SOLN
25.0000 ug | INTRAMUSCULAR | Status: AC
  Administered 2013-10-21 (×2): 25 ug via INTRAVENOUS

## 2013-10-21 MED ORDER — ONDANSETRON HCL 4 MG/2ML IJ SOLN
4.0000 mg | Freq: Once | INTRAMUSCULAR | Status: AC
  Administered 2013-10-21: 4 mg via INTRAVENOUS

## 2013-10-21 MED ORDER — LACTATED RINGERS IV SOLN
INTRAVENOUS | Status: DC
  Administered 2013-10-21: 1000 mL via INTRAVENOUS

## 2013-10-21 MED ORDER — LIDOCAINE HCL (PF) 1 % IJ SOLN
INTRAMUSCULAR | Status: DC | PRN
  Administered 2013-10-21: 4 mL

## 2013-10-21 MED ORDER — MIDAZOLAM HCL 5 MG/5ML IJ SOLN
INTRAMUSCULAR | Status: DC | PRN
  Administered 2013-10-21 (×2): 1 mg via INTRAVENOUS

## 2013-10-21 MED ORDER — FENTANYL CITRATE 0.05 MG/ML IJ SOLN
INTRAMUSCULAR | Status: DC | PRN
  Administered 2013-10-21: 50 ug via INTRAVENOUS

## 2013-10-21 MED ORDER — MIDAZOLAM HCL 2 MG/2ML IJ SOLN
1.0000 mg | INTRAMUSCULAR | Status: DC | PRN
  Administered 2013-10-21 (×2): 2 mg via INTRAVENOUS
  Filled 2013-10-21: qty 2

## 2013-10-21 MED ORDER — CIPROFLOXACIN IN D5W 200 MG/100ML IV SOLN
200.0000 mg | Freq: Two times a day (BID) | INTRAVENOUS | Status: DC
  Administered 2013-10-21: 200 mg via INTRAVENOUS

## 2013-10-21 MED ORDER — ONDANSETRON HCL 4 MG/2ML IJ SOLN: 4.0000 mg | Freq: Once | INTRAMUSCULAR | Status: DC | PRN

## 2013-10-21 MED ORDER — LIDOCAINE HCL (PF) 1 % IJ SOLN
INTRAMUSCULAR | Status: AC
  Filled 2013-10-21: qty 30

## 2013-10-21 MED ORDER — HEPARIN SOD (PORK) LOCK FLUSH 100 UNIT/ML IV SOLN
INTRAVENOUS | Status: AC
  Filled 2013-10-21: qty 5

## 2013-10-21 SURGICAL SUPPLY — 42 items
APPLIER CLIP 9.375 SM OPEN (CLIP)
BAG DECANTER FOR FLEXI CONT (MISCELLANEOUS) ×3 IMPLANT
BAG HAMPER (MISCELLANEOUS) ×3 IMPLANT
CLIP APPLIE 9.375 SM OPEN (CLIP) IMPLANT
CLOSURE WOUND 1/4 X3 (GAUZE/BANDAGES/DRESSINGS)
CLOTH BEACON ORANGE TIMEOUT ST (SAFETY) ×3 IMPLANT
COVER LIGHT HANDLE STERIS (MISCELLANEOUS) ×6 IMPLANT
DECANTER SPIKE VIAL GLASS SM (MISCELLANEOUS) ×3 IMPLANT
DRAPE C-ARM FOLDED MOBILE STRL (DRAPES) ×3 IMPLANT
DRSG TEGADERM 2-3/8X2-3/4 SM (GAUZE/BANDAGES/DRESSINGS) IMPLANT
DURAPREP 26ML APPLICATOR (WOUND CARE) ×3 IMPLANT
ELECT REM PT RETURN 9FT ADLT (ELECTROSURGICAL) ×3
ELECTRODE REM PT RTRN 9FT ADLT (ELECTROSURGICAL) ×1 IMPLANT
GLOVE BIOGEL PI IND STRL 7.5 (GLOVE) ×1 IMPLANT
GLOVE BIOGEL PI INDICATOR 7.5 (GLOVE) ×2
GLOVE SKINSENSE NS SZ7.0 (GLOVE) ×2
GLOVE SKINSENSE STRL SZ7.0 (GLOVE) ×1 IMPLANT
GOWN STRL REUS W/TWL LRG LVL3 (GOWN DISPOSABLE) ×6 IMPLANT
IV NS 500ML (IV SOLUTION) ×2
IV NS 500ML BAXH (IV SOLUTION) ×1 IMPLANT
KIT PORT POWER 8FR ISP MRI (CATHETERS) ×3 IMPLANT
KIT ROOM TURNOVER APOR (KITS) ×3 IMPLANT
MANIFOLD NEPTUNE II (INSTRUMENTS) ×3 IMPLANT
NEEDLE HYPO 18GX1.5 BLUNT FILL (NEEDLE) ×3 IMPLANT
NEEDLE HYPO 25X1 1.5 SAFETY (NEEDLE) ×3 IMPLANT
PACK MINOR (CUSTOM PROCEDURE TRAY) ×3 IMPLANT
PAD ARMBOARD 7.5X6 YLW CONV (MISCELLANEOUS) ×3 IMPLANT
SET BASIN LINEN APH (SET/KITS/TRAYS/PACK) ×3 IMPLANT
SHEATH COOK PEEL AWAY SET 8F (SHEATH) IMPLANT
SPONGE GAUZE 2X2 8PLY STER LF (GAUZE/BANDAGES/DRESSINGS)
SPONGE GAUZE 2X2 8PLY STRL LF (GAUZE/BANDAGES/DRESSINGS) IMPLANT
STRIP CLOSURE SKIN 1/4X3 (GAUZE/BANDAGES/DRESSINGS) IMPLANT
SUT VIC AB 4-0 SH 27 (SUTURE) ×2
SUT VIC AB 4-0 SH 27XBRD (SUTURE) ×1 IMPLANT
SUT VIC AB 5-0 P-3 18X BRD (SUTURE) ×1 IMPLANT
SUT VIC AB 5-0 P3 18 (SUTURE) ×2
SYR 20CC LL (SYRINGE) ×3 IMPLANT
SYR 5ML LL (SYRINGE) ×6 IMPLANT
SYR BULB IRRIGATION 50ML (SYRINGE) ×3 IMPLANT
SYR CONTROL 10ML LL (SYRINGE) ×3 IMPLANT
TOWEL OR 17X26 4PK STRL BLUE (TOWEL DISPOSABLE) ×3 IMPLANT
WATER STERILE IRR 1000ML POUR (IV SOLUTION) ×6 IMPLANT

## 2013-10-21 NOTE — Progress Notes (Signed)
Post OP Check  Unable to place porta cath on right side.  Have scheduled porta placement with  [facility]. They will do this on Monday and she will remain off her coumadin.  Unfortunately this will delay her chemo. [facility] Notified.  Post op there was no pneumothorax.  Discharge and follow up arranged.

## 2013-10-21 NOTE — Anesthesia Postprocedure Evaluation (Signed)
  Anesthesia Post-op Note  Patient: Marissa Turner  Procedure(s) Performed: Procedure(s): ATTEMPTEDINSERTION PORT-A-CATH (Right)  Patient Location: PACU  Anesthesia Type:MAC  Level of Consciousness: awake, alert , oriented and patient cooperative  Airway and Oxygen Therapy: Patient Spontanous Breathing  Post-op Pain: 2 /10, mild  Post-op Assessment: Post-op Vital signs reviewed, Patient's Cardiovascular Status Stable, Respiratory Function Stable, Patent Airway, No signs of Nausea or vomiting and Pain level controlled  Post-op Vital Signs: Reviewed and stable  Last Vitals:  Filed Vitals:   10/21/13 1405  BP: 127/69  Pulse:   Temp:   Resp: 65    Complications: No apparent anesthesia complications

## 2013-10-21 NOTE — Anesthesia Preprocedure Evaluation (Signed)
Anesthesia Evaluation  Patient identified by MRN, date of birth, ID band Patient awake    Reviewed: Allergy & Precautions, H&P , NPO status , Patient's Chart, lab work & pertinent test results  History of Anesthesia Complications (+) PONV and history of anesthetic complications  Airway Mallampati: II TM Distance: >3 FB Neck ROM: Full    Dental  (+) Edentulous Upper, Edentulous Lower   Pulmonary Current Smoker,  breath sounds clear to auscultation        Cardiovascular hypertension, Pt. on medications + dysrhythmias Atrial Fibrillation Rhythm:Regular Rate:Normal     Neuro/Psych PSYCHIATRIC DISORDERS Depression    GI/Hepatic PUD,   Endo/Other    Renal/GU      Musculoskeletal   Abdominal   Peds  Hematology   Anesthesia Other Findings   Reproductive/Obstetrics                           Anesthesia Physical Anesthesia Plan  ASA: III  Anesthesia Plan: MAC   Post-op Pain Management:    Induction: Intravenous  Airway Management Planned: Simple Face Mask  Additional Equipment:   Intra-op Plan:   Post-operative Plan:   Informed Consent: I have reviewed the patients History and Physical, chart, labs and discussed the procedure including the risks, benefits and alternatives for the proposed anesthesia with the patient or authorized representative who has indicated his/her understanding and acceptance.     Plan Discussed with: Surgeon  Anesthesia Plan Comments: (Pt refuses general anesthesia, wants MAC with local.)        Anesthesia Quick Evaluation

## 2013-10-21 NOTE — OR Nursing (Signed)
Complaints of  Pain to right knee ,  Stated   She fell at work yesterday over the  The vacumn cleanner cord

## 2013-10-21 NOTE — Transfer of Care (Signed)
Immediate Anesthesia Transfer of Care Note  Patient: Marissa Turner  Procedure(s) Performed: Procedure(s): ATTEMPTEDINSERTION PORT-A-CATH (Right)  Patient Location: PACU  Anesthesia Type:MAC  Level of Consciousness: awake and patient cooperative  Airway & Oxygen Therapy: Patient Spontanous Breathing and Patient connected to face mask oxygen  Post-op Assessment: Report given to PACU RN, Post -op Vital signs reviewed and stable and Patient moving all extremities  Post vital signs: Reviewed and stable  Complications: No apparent anesthesia complications

## 2013-10-21 NOTE — Brief Op Note (Signed)
10/21/2013  3:13 PM  PATIENT:  Arlyce Dice  73 y.o. female  PRE-OPERATIVE DIAGNOSIS:  non-hodgkins lymphoma  POST-OPERATIVE DIAGNOSIS:  non-hodgkins lymphoma  PROCEDURE:  Procedure(s): ATTEMPTEDINSERTION PORT-A-CATH (Right)  SURGEON:  Surgeon(s) and Role:    * Scherry Ran, MD - Primary  PHYSICIAN ASSISTANT:   ASSISTANTS: none   ANESTHESIA:   IV sedation  EBL:  Total I/O In: 800 [I.V.:800] Out: -   BLOOD ADMINISTERED:none  DRAINS: none   LOCAL MEDICATIONS USED:  XYLOCAINE   SPECIMEN:  No Specimen  DISPOSITION OF SPECIMEN:  N/A  COUNTS:  YES and NO yes  TOURNIQUET:  * No tourniquets in log *  DICTATION: .Other Dictation: Dictation Number OR dictation # [id number].  PLAN OF CARE: Discharge to home after PACU  PATIENT DISPOSITION:  PACU - hemodynamically stable.   Delay start of Pharmacological VTE agent (>24hrs) due to surgical blood loss or risk of bleeding: no

## 2013-10-21 NOTE — Progress Notes (Signed)
73 yr old W. Female with recently diagnosed B cell lymphoma for placement of porta cath.   Procedure and risks explained and informed consent obtaine.  Filed Vitals:   10/21/13 1405  BP: 127/69  Pulse:   Temp:   Resp: 65  pulse 67  Temp 98.8  O2 sat98%  No clinical changes since h&p, DICT. # [id number].

## 2013-10-22 NOTE — Op Note (Signed)
NAME:  Marissa Turner, VECCHIARELLI NO.:  [id number]  MEDICAL RECORD NO.:  [id number]  LOCATION:  [facility]                          FACILITY:  [facility]  PHYSICIAN:  Felicie Morn, M.D. DATE OF BIRTH:  Feb 15, 1940  DATE OF PROCEDURE:  10/21/2013 DATE OF DISCHARGE:  10/21/2013                              OPERATIVE REPORT   NOTE:  This is a 73 year old, white female who just has newly diagnosed B-cell lymphoma.  She was referred for placement of Port-A-Cath after her cervical node biopsy confirmed the diagnosis.  We discussed complications including bleeding, infection, pneumothorax, thrombosis, and catheter embolization and informed consent was obtained.  Wound classification is clean.  SPECIMEN:  None.  PROCEDURE:  The patient was placed in Trendelenburg, after prepping the right hemithorax with Betadine solution and draping in the usual manner. Note, this patient was left-handed and wanted to have the catheter placed on the right side.  We made several attempts to cannulate the subclavian vein on the right side, we were unable to do so.  I terminated the procedure.  I did not want to try to do it on the other side as this patient is a heavy smoker and I did not want to risk any possibility of pneumothorax.  Postoperatively, we checked an upright film, there was no pneumothorax noted.  This patient will then be referred to Interventional Radiology, where hopefully this can be done with success.     Felicie Morn, M.D.     WB/MEDQ  D:  10/21/2013  T:  10/22/2013  Job:  [id number]  cc:   [name] D. Legrand Rams, MD Fax: [phone]  Oncology Department

## 2013-10-23 NOTE — Progress Notes (Signed)
-  No show, [facility] staff got in touch with patient, rescheduled accordingly.-  KEFALAS,THOMAS

## 2013-10-24 ENCOUNTER — Ambulatory Visit (INDEPENDENT_AMBULATORY_CARE_PROVIDER_SITE_OTHER): Payer: Medicare Other | Admitting: *Deleted

## 2013-10-24 ENCOUNTER — Inpatient Hospital Stay (HOSPITAL_COMMUNITY): Payer: Medicare Other

## 2013-10-24 ENCOUNTER — Ambulatory Visit (HOSPITAL_COMMUNITY): Payer: Medicare Other

## 2013-10-24 ENCOUNTER — Other Ambulatory Visit (HOSPITAL_COMMUNITY): Payer: Self-pay | Admitting: Hematology and Oncology

## 2013-10-24 ENCOUNTER — Ambulatory Visit (HOSPITAL_COMMUNITY)
Admit: 2013-10-24 | Discharge: 2013-10-24 | Disposition: A | Discharge status: Home / Self Care | Payer: Medicare Other | Attending: General Surgery | Admitting: General Surgery

## 2013-10-24 ENCOUNTER — Encounter (HOSPITAL_COMMUNITY): Payer: Self-pay

## 2013-10-24 ENCOUNTER — Ambulatory Visit (HOSPITAL_COMMUNITY)
Admission: RE | Admit: 2013-10-24 | Discharge: 2013-10-24 | Disposition: A | Discharge status: Home / Self Care | Payer: Medicare Other | Source: Ambulatory Visit | Attending: General Surgery | Admitting: General Surgery

## 2013-10-24 ENCOUNTER — Other Ambulatory Visit (HOSPITAL_COMMUNITY): Payer: Self-pay | Admitting: General Surgery

## 2013-10-24 DIAGNOSIS — Z87891 Personal history of nicotine dependence: Secondary | ICD-10-CM | POA: Diagnosis not present

## 2013-10-24 DIAGNOSIS — Z5181 Encounter for therapeutic drug level monitoring: Secondary | ICD-10-CM | POA: Diagnosis not present

## 2013-10-24 DIAGNOSIS — Z7901 Long term (current) use of anticoagulants: Secondary | ICD-10-CM

## 2013-10-24 DIAGNOSIS — I10 Essential (primary) hypertension: Secondary | ICD-10-CM | POA: Insufficient documentation

## 2013-10-24 DIAGNOSIS — Z9089 Acquired absence of other organs: Secondary | ICD-10-CM | POA: Diagnosis not present

## 2013-10-24 DIAGNOSIS — C8589 Other specified types of non-Hodgkin lymphoma, extranodal and solid organ sites: Secondary | ICD-10-CM | POA: Diagnosis not present

## 2013-10-24 DIAGNOSIS — Z79899 Other long term (current) drug therapy: Secondary | ICD-10-CM | POA: Insufficient documentation

## 2013-10-24 DIAGNOSIS — C8580 Other specified types of non-Hodgkin lymphoma, unspecified site: Secondary | ICD-10-CM

## 2013-10-24 DIAGNOSIS — M1990 Unspecified osteoarthritis, unspecified site: Secondary | ICD-10-CM | POA: Diagnosis not present

## 2013-10-24 DIAGNOSIS — I4891 Unspecified atrial fibrillation: Secondary | ICD-10-CM | POA: Diagnosis not present

## 2013-10-24 DIAGNOSIS — Z452 Encounter for adjustment and management of vascular access device: Secondary | ICD-10-CM | POA: Diagnosis not present

## 2013-10-24 DIAGNOSIS — I482 Chronic atrial fibrillation, unspecified: Secondary | ICD-10-CM

## 2013-10-24 LAB — CBC
HCT: 36.7 % (ref 36.0–46.0)
HEMOGLOBIN: 11.8 g/dL — AB (ref 12.0–15.0)
MCH: 25.7 pg — ABNORMAL LOW (ref 26.0–34.0)
MCHC: 32.2 g/dL (ref 30.0–36.0)
MCV: 80 fL (ref 78.0–100.0)
Platelets: 389 10*3/uL (ref 150–400)
RBC: 4.59 MIL/uL (ref 3.87–5.11)
RDW: 17.5 % — AB (ref 11.5–15.5)
WBC: 11.1 10*3/uL — AB (ref 4.0–10.5)

## 2013-10-24 LAB — BASIC METABOLIC PANEL
ANION GAP: 11 (ref 5–15)
BUN: 8 mg/dL (ref 6–23)
CHLORIDE: 100 meq/L (ref 96–112)
CO2: 28 meq/L (ref 19–32)
Calcium: 8.9 mg/dL (ref 8.4–10.5)
Creatinine, Ser: 0.61 mg/dL (ref 0.50–1.10)
GFR calc Af Amer: >90 mL/min (ref >90)
GFR calc non Af Amer: 88 mL/min — ABNORMAL LOW (ref >90)
Glucose, Bld: 99 mg/dL (ref 70–99)
Potassium: 4.2 mEq/L (ref 3.7–5.3)
SODIUM: 139 meq/L (ref 137–147)

## 2013-10-24 LAB — POCT INR: INR: 1

## 2013-10-24 LAB — PROTIME-INR
INR: 0.92 (ref 0.00–1.49)
Prothrombin Time: 12.4 seconds (ref 11.6–15.2)

## 2013-10-24 MED ORDER — VANCOMYCIN HCL IN DEXTROSE 1-5 GM/200ML-% IV SOLN
1000.0000 mg | Freq: Once | INTRAVENOUS | Status: AC
  Administered 2013-10-24: 1000 mg via INTRAVENOUS

## 2013-10-24 MED ORDER — FENTANYL CITRATE 0.05 MG/ML IJ SOLN
INTRAMUSCULAR | Status: AC
  Filled 2013-10-24: qty 4

## 2013-10-24 MED ORDER — VANCOMYCIN HCL IN DEXTROSE 1-5 GM/200ML-% IV SOLN
1000.0000 mg | Freq: Once | INTRAVENOUS | Status: DC
  Filled 2013-10-24: qty 200

## 2013-10-24 MED ORDER — LIDOCAINE-EPINEPHRINE (PF) 1 %-1:200000 IJ SOLN
INTRAMUSCULAR | Status: AC
  Filled 2013-10-24: qty 10

## 2013-10-24 MED ORDER — FENTANYL CITRATE 0.05 MG/ML IJ SOLN
INTRAMUSCULAR | Status: DC | PRN
  Administered 2013-10-24: 50 ug via INTRAVENOUS
  Administered 2013-10-24: 25 ug via INTRAVENOUS

## 2013-10-24 MED ORDER — MIDAZOLAM HCL 2 MG/2ML IJ SOLN
INTRAMUSCULAR | Status: DC | PRN
  Administered 2013-10-24: 1 mg via INTRAVENOUS
  Administered 2013-10-24: 0.5 mg via INTRAVENOUS

## 2013-10-24 MED ORDER — MIDAZOLAM HCL 2 MG/2ML IJ SOLN
INTRAMUSCULAR | Status: AC
  Filled 2013-10-24: qty 4

## 2013-10-24 MED ORDER — VANCOMYCIN HCL IN DEXTROSE 1-5 GM/200ML-% IV SOLN
1000.0000 mg | Freq: Once | INTRAVENOUS | Status: DC
Start: 2013-10-24 — End: 2013-10-24

## 2013-10-24 MED ORDER — MIDAZOLAM HCL 2 MG/2ML IJ SOLN: INTRAMUSCULAR | Status: AC | PRN

## 2013-10-24 MED ORDER — SODIUM CHLORIDE 0.9 % IV SOLN: Freq: Once | INTRAVENOUS | Status: DC

## 2013-10-24 MED ORDER — FENTANYL CITRATE 0.05 MG/ML IJ SOLN: INTRAMUSCULAR | Status: AC | PRN

## 2013-10-24 MED ORDER — LIDOCAINE HCL 1 % IJ SOLN
INTRAMUSCULAR | Status: AC
  Filled 2013-10-24: qty 20

## 2013-10-24 MED ORDER — HEPARIN SOD (PORK) LOCK FLUSH 100 UNIT/ML IV SOLN
INTRAVENOUS | Status: AC
  Filled 2013-10-24: qty 5

## 2013-10-24 NOTE — Discharge Instructions (Signed)
Implanted Port Insertion, Care After Refer to this sheet in the next few weeks. These instructions provide you with information on caring for yourself after your procedure. Your health care provider may also give you more specific instructions. Your treatment has been planned according to current medical practices, but problems sometimes occur. Call your health care provider if you have any problems or questions after your procedure. WHAT TO EXPECT AFTER THE PROCEDURE After your procedure, it is typical to have the following:   Discomfort at the port insertion site. Ice packs to the area will help.  Bruising on the skin over the port. This will subside in 3-4 days. HOME CARE INSTRUCTIONS  After your port is placed, you will get a manufacturer's information card. The card has information about your port. Keep this card with you at all times.   Know what kind of port you have. There are many types of ports available.   Wear a medical alert bracelet in case of an emergency. This can help alert health care workers that you have a port.   The port can stay in for as long as your health care provider believes it is necessary.   A home health care nurse may give medicines and take care of the port.   You or a family member can get special training and directions for giving medicine and taking care of the port at home.  SEEK MEDICAL CARE IF:   Your port does not flush or you are unable to get a blood return.   You have a fever or chills. SEEK IMMEDIATE MEDICAL CARE IF:  You have new fluid or pus coming from your incision.   You notice a bad smell coming from your incision site.   You have swelling, pain, or more redness at the incision or port site.   You have chest pain or shortness of breath. Document Released: 11/03/2012 Document Revised: 01/18/2013 Document Reviewed: 11/03/2012 ExitCare Patient Information 2015 [employer]. This information is not intended to replace  advice given to you by your health care provider. Make sure you discuss any questions you have with your health care provider.  

## 2013-10-24 NOTE — H&P (Signed)
Chief Complaint: B cell lymphoma Need access for chemo  Referring Physician(s): Bradford,William S  History of Present Illness: Marissa Turner is a 73 y.o. female  Dx B cell lymphoma Attempted Port a Cath with Dr Romona Curls 9/25- unsuccessful He has requested placement with IR Pt scheduled now for same Smoker; Afib- off coumadin 7 days now   Past Medical History  Diagnosis Date  . Atrial fibrillation, chronic 2004    left atrial thrombus in 2004; near syncope in 2008; echocardiogram in 2008-normal EF, mild left atrial enlargement, no valvular abnormalities, lipomatous hypertrophy; no aortic atheroma on TEE in 2004  . Chronic anticoagulation 2004    warfarin  . Tobacco abuse     40 pack years; quit attempt in 2012  . DJD (degenerative joint disease), lumbosacral     Also hip  . Peptic ulcer disease     s/p Billroth II  . Depression   . Fracture of wrist     Left  . Hypertension     Normal carotid ultrasound in 2008  . PONV (postoperative nausea and vomiting)   . Dysrhythmia     chronic AFib    Past Surgical History  Procedure Laterality Date  . Cholecystectomy  2004    [place] II; splenectomy; incidental appendectomy  . Abdominal hysterectomy      w/o oophorectomy  . Cesarean section    . Tonsillectomy    . Colonoscopy  1980s    patient denies ever having undergone colonoscopy as of 2014  . Appendectomy    . Splenectomy, total    . Lymph node biopsy Left 09/15/2013    Procedure: LEFT NECK CERVICAL NODE BIOPSY;  Surgeon: Scherry Ran, MD;  Location: [facility];  Service: General;  Laterality: Left;    Allergies: Diphenhydramine hcl; Estrogens; Penicillins; and Codeine  Medications: Prior to Admission medications   Medication Sig Start Date End Date Taking? Authorizing Provider  acetaminophen (TYLENOL) 500 MG tablet Take 1,000 mg by mouth daily as needed for headache.   Yes Historical Provider, MD    acyclovir (ZOVIRAX) 400 MG tablet Take 400 mg by mouth daily. 10/14/13  Yes Farrel Gobble, MD  ALPRAZolam Duanne Moron) 0.5 MG tablet Take 0.5 mg by mouth 3 (three) times daily.    Yes Historical Provider, MD  cetirizine (ZYRTEC) 10 MG tablet Take 10 mg by mouth daily.     Yes Historical Provider, MD  citalopram (CELEXA) 20 MG tablet Take 20 mg by mouth daily.   Yes Historical Provider, MD  digoxin (LANOXIN) 0.125 MG tablet Take 125 mcg by mouth daily.    Yes Historical Provider, MD  diltiazem (DILACOR XR) 120 MG 24 hr capsule Take 120 mg by mouth daily.   Yes Historical Provider, MD  gabapentin (NEURONTIN) 100 MG capsule Take 1 capsule (100 mg total) by mouth 2 (two) times daily. 10/04/13  Yes Carole Civil, MD  metoprolol (TOPROL-XL) 25 MG 24 hr tablet Take 12.5 mg by mouth 2 (two) times daily.    Yes Historical Provider, MD  traMADol (ULTRAM) 50 MG tablet Take 50 mg by mouth every 12 (twelve) hours as needed for moderate pain.    Yes Historical Provider, MD  lidocaine-prilocaine (EMLA) cream Apply a quarter size amount to port site 1 hour prior to chemo. Do not rub in. Cover with plastic wrap. 10/13/13   Farrel Gobble, MD  prochlorperazine (COMPAZINE) 10 MG tablet Take 1 tablet (10  mg total) by mouth every 6 (six) hours as needed for nausea or vomiting. 10/13/13   Farrel Gobble, MD    Family History  Problem Relation Age of Onset  . Heart attack Mother     also brother  . Breast cancer Sister   . Heart attack Brother   . Breast cancer Sister     x2  . Cirrhosis Sister   . Cancer Brother     Gastric carcinoma; also father    History   Social History  . Marital Status: Married    Spouse Name: N/A    Number of Children: 2  . Years of Education: N/A   Occupational History  . Housecleaning    Social History Main Topics  . Smoking status: Current Every Day Smoker -- 0.25 packs/day for 55 years    Types: Cigarettes  . Smokeless tobacco: None     Comment: Quit attempt in 2012   . Alcohol Use: No  . Drug Use: No  . Sexual Activity: Yes    Birth Control/ Protection: Surgical   Other Topics Concern  . None   Social History Narrative   Married with one child and one adopted child, 1 stillbirth, and one deceased child at age 64 due to illness   No regular exercise    Review of Systems: A 12 point ROS discussed and pertinent positives are indicated in the HPI above.  All other systems are negative.  Review of Systems  Constitutional: Negative for fever, activity change, appetite change, fatigue and unexpected weight change.  Respiratory: Positive for cough and shortness of breath. Negative for chest tightness.   Cardiovascular: Negative for chest pain.  Gastrointestinal: Negative for abdominal pain.  Musculoskeletal: Negative for back pain and neck stiffness.  Skin: Negative for color change.  Neurological: Negative for dizziness and headaches.  Psychiatric/Behavioral: Negative for behavioral problems and confusion.    Vital Signs: BP 135/99  Pulse 44  Temp(Src) 97.7 F (36.5 C) (Oral)  Resp 18  Ht 5' (1.524 m)  Wt 56.019 kg (123 lb 8 oz)  BMI 24.12 kg/m2  SpO2 97%  Physical Exam  Constitutional: She is oriented to person, place, and time. She appears well-developed.  Cardiovascular: Normal rate and normal heart sounds.   No murmur heard. Irreg  Pulmonary/Chest: Effort normal and breath sounds normal. No respiratory distress. She has no wheezes.  Abdominal: Soft. Bowel sounds are normal. There is no tenderness.  Musculoskeletal: Normal range of motion.  Neurological: She is alert and oriented to person, place, and time. Coordination normal.  Skin: Skin is warm and dry.  Psychiatric: She has a normal mood and affect. Her behavior is normal. Judgment and thought content normal.    Imaging: Nm Pet Image Initial (pi) Skull Base To Thigh  10/06/2013   CLINICAL DATA:  Initial treatment strategy for non-Hodgkin's lymphoma. Large cell lymphoma of the  neck.  EXAM: NUCLEAR MEDICINE PET SKULL BASE TO THIGH  TECHNIQUE: 6.0 mCi F-18 FDG was injected intravenously. Full-ring PET imaging was performed from the skull base to thigh after the radiotracer. CT data was obtained and used for attenuation correction and anatomic localization.  FASTING BLOOD GLUCOSE:  Value: 116 mg/dl  COMPARISON:  Head CT, neck CT 08/05/2013  FINDINGS: NECK  There is asymmetric hypermetabolic activity within the right vallecular region with intense metabolic activity (SUV max of 13.8). There there is an adjacent hypermetabolic enlarged right level II lymph node measuring 14 mm short axis (image 33, series 4)  with SUV max 3.2. Several small left and right level 2 lymph node less than 1 cm and do not have associated metabolic activity (image 31 and 30 fused series).  CHEST  Small right upper lobe pulmonary nodule described on comparison neck CT does not have associated metabolic activity measuring 7 mm (image 70, series 4). Within the lingula there is a rounded 11 mm nodule (image 75, series 4) which has very low metabolic activity for size (SUV max 1.0). There is a mildly hypermetabolic right lower paratracheal lymph node measuring 12 mm short axis SUV max 2.3.  ABDOMEN/PELVIS  No abnormal hypermetabolic activity within the liver, pancreas, adrenal glands, or spleen. No hypermetabolic lymph nodes in the abdomen or pelvis.  SKELETON  No focal hypermetabolic activity to suggest skeletal metastasis.  IMPRESSION: 1. Hypermetabolic thickening within the right vallecular consists with primary head and neck cancer. 2. Single enlarged hypermetabolic right level IIa lymph node consistent local nodal metastasis. 3. Small sub cm bilateral additional level 2 lymph nodes which does not have associated metabolic activity. 4. Mildly hypermetabolic right lower paratracheal lymph node is likely reactive. 5. Rounded nodule in the lingula has very low metabolic activity, therefore it is unlikely a metastatic  lesion. Recommend attention follow-up. 6. Right upper lobe pulmonary nodules without associated metabolic activity is likely post inflammatory or infectious.   Electronically Signed   By: Suzy Bouchard M.D.   On: 10/06/2013 11:24   Dg Chest Portable 1 View  10/21/2013   CLINICAL DATA:  Multiple attempts at RIGHT-sided Port-A-Cath placement.  EXAM: PORTABLE CHEST - 1 VIEW  COMPARISON:  PET-CT October 06, 2013 and chest radiograph May 09, 2008  FINDINGS: The cardiac silhouette appears mildly enlarged, accentuated by AP technique. Mediastinal silhouette is nonsuspicious. Diffuse moderate interstitial prominence. Increased lung volumes. No pleural effusions. No pneumothorax. Mild biapical pleural thickening.  Surgical clips projected GA junction. Osseous structures are nonsuspicious.  IMPRESSION: Mild cardiomegaly. Apparent COPD without acute pulmonary process, no pneumothorax.   Electronically Signed   By: Elon Alas   On: 10/21/2013 15:15    Labs: Lab Results  Component Value Date   WBC 11.1* 10/24/2013   HCT 36.7 10/24/2013   MCV 80.0 10/24/2013   PLT 389 10/24/2013   NA 139 10/24/2013   K 4.2 10/24/2013   CL 100 10/24/2013   CO2 28 10/24/2013   GLUCOSE 99 10/24/2013   BUN 8 10/24/2013   CREATININE 0.61 10/24/2013   CALCIUM 8.9 10/24/2013   PROT 7.8 10/10/2013   ALBUMIN 3.8 10/10/2013   AST 27 10/10/2013   ALT 24 10/10/2013   ALKPHOS 70 10/10/2013   BILITOT <0.2* 10/10/2013   GFRNONAA 88* 10/24/2013   GFRAA >90 10/24/2013   INR 0.92 10/24/2013    Assessment and Plan:  New diagnosis B cell lymphoma Need access for chemo Unsuccessful attempt at [facility] placement with Dr Romona Curls Now scheduled for [facility] a cath placement in IR Pt aware of procedure benefits and ridkd and agreeable to proceed Consent signed andin chart  Thank you for this interesting consult.  I greatly enjoyed meeting Marissa Turner and look forward to participating in their care.   I spent a total of 20 minutes face to  face in clinical consultation, greater than 50% of which was counseling/coordinating care for [facility] placement  Signed: Suhaas Agena A 10/24/2013, 8:13 AM

## 2013-10-24 NOTE — Procedures (Signed)
Interventional Radiology Procedure Note  Procedure: Placement of a right IJ approach single lumen PowerPort.  Tip is positioned at the superior cavoatrial junction and catheter is ready for immediate use.  Complications: No immediate Recommendations:  - Ok to shower tomorrow - Do not submerge for 7 days - Routine line care   Signed,  Syncere Eble S. Jeff Frieden, DO    

## 2013-10-25 ENCOUNTER — Other Ambulatory Visit (HOSPITAL_COMMUNITY): Payer: Self-pay | Admitting: *Deleted

## 2013-10-25 ENCOUNTER — Inpatient Hospital Stay (HOSPITAL_COMMUNITY): Payer: Medicare Other

## 2013-10-25 ENCOUNTER — Ambulatory Visit (HOSPITAL_COMMUNITY): Payer: Medicare Other

## 2013-10-25 DIAGNOSIS — Z7901 Long term (current) use of anticoagulants: Secondary | ICD-10-CM

## 2013-10-26 ENCOUNTER — Ambulatory Visit (HOSPITAL_COMMUNITY): Payer: Medicare Other

## 2013-10-26 ENCOUNTER — Inpatient Hospital Stay (HOSPITAL_COMMUNITY): Payer: Medicare Other

## 2013-10-26 ENCOUNTER — Ambulatory Visit (HOSPITAL_COMMUNITY): Payer: Medicare Other | Admitting: Oncology

## 2013-10-27 NOTE — Progress Notes (Signed)
-  Rescheduled-  KEFALAS,THOMAS  

## 2013-10-28 ENCOUNTER — Ambulatory Visit (HOSPITAL_COMMUNITY): Payer: Medicare Other

## 2013-10-28 ENCOUNTER — Encounter (HOSPITAL_COMMUNITY): Payer: Self-pay

## 2013-10-28 ENCOUNTER — Encounter (HOSPITAL_COMMUNITY): Payer: Medicare Other | Attending: Hematology

## 2013-10-28 VITALS — BP 110/67 | HR 70 | Temp 98.1°F | Resp 18

## 2013-10-28 DIAGNOSIS — Z9079 Acquired absence of other genital organ(s): Secondary | ICD-10-CM | POA: Insufficient documentation

## 2013-10-28 DIAGNOSIS — C8519 Unspecified B-cell lymphoma, extranodal and solid organ sites: Secondary | ICD-10-CM | POA: Insufficient documentation

## 2013-10-28 DIAGNOSIS — M47817 Spondylosis without myelopathy or radiculopathy, lumbosacral region: Secondary | ICD-10-CM | POA: Insufficient documentation

## 2013-10-28 DIAGNOSIS — Z9089 Acquired absence of other organs: Secondary | ICD-10-CM | POA: Insufficient documentation

## 2013-10-28 DIAGNOSIS — J449 Chronic obstructive pulmonary disease, unspecified: Secondary | ICD-10-CM | POA: Insufficient documentation

## 2013-10-28 DIAGNOSIS — F329 Major depressive disorder, single episode, unspecified: Secondary | ICD-10-CM | POA: Insufficient documentation

## 2013-10-28 DIAGNOSIS — F17210 Nicotine dependence, cigarettes, uncomplicated: Secondary | ICD-10-CM | POA: Insufficient documentation

## 2013-10-28 DIAGNOSIS — D509 Iron deficiency anemia, unspecified: Secondary | ICD-10-CM

## 2013-10-28 DIAGNOSIS — Z7901 Long term (current) use of anticoagulants: Secondary | ICD-10-CM | POA: Insufficient documentation

## 2013-10-28 DIAGNOSIS — I482 Chronic atrial fibrillation: Secondary | ICD-10-CM | POA: Insufficient documentation

## 2013-10-28 DIAGNOSIS — I10 Essential (primary) hypertension: Secondary | ICD-10-CM | POA: Insufficient documentation

## 2013-10-28 DIAGNOSIS — Z90710 Acquired absence of both cervix and uterus: Secondary | ICD-10-CM | POA: Insufficient documentation

## 2013-10-28 DIAGNOSIS — Z980 Intestinal bypass and anastomosis status: Secondary | ICD-10-CM | POA: Insufficient documentation

## 2013-10-28 DIAGNOSIS — E611 Iron deficiency: Secondary | ICD-10-CM

## 2013-10-28 MED ORDER — HEPARIN SOD (PORK) LOCK FLUSH 100 UNIT/ML IV SOLN: 500.0000 [IU] | Freq: Once | INTRAVENOUS | Status: DC | PRN

## 2013-10-28 MED ORDER — SODIUM CHLORIDE 0.9 % IV SOLN
Freq: Once | INTRAVENOUS | Status: AC
  Administered 2013-10-28: 10 mL/h via INTRAVENOUS

## 2013-10-28 MED ORDER — FERUMOXYTOL INJECTION 510 MG/17 ML
510.0000 mg | Freq: Once | INTRAVENOUS | Status: AC
  Administered 2013-10-28: 510 mg via INTRAVENOUS
  Filled 2013-10-28: qty 17

## 2013-10-28 MED ORDER — SODIUM CHLORIDE 0.9 % IJ SOLN: 10.0000 mL | INTRAMUSCULAR | Status: DC | PRN

## 2013-10-28 NOTE — Patient Instructions (Signed)
[place] Discharge Instructions  RECOMMENDATIONS MADE BY THE CONSULTANT AND ANY TEST RESULTS WILL BE SENT TO YOUR REFERRING PHYSICIAN.  You had an iron transfusion today.  Please call the clinic if you have any questions or concerns  Thank you for choosing [place] to provide your oncology and hematology care.  To afford each patient quality time with our providers, please arrive at least 15 minutes before your scheduled appointment time.  With your help, our goal is to use those 15 minutes to complete the necessary work-up to ensure our physicians have the information they need to help with your evaluation and healthcare recommendations.    Effective January 1st, 2014, we ask that you re-schedule your appointment with our physicians should you arrive 10 or more minutes late for your appointment.  We strive to give you quality time with our providers, and arriving late affects you and other patients whose appointments are after yours.    Again, thank you for choosing [facility].  Our hope is that these requests will decrease the amount of time that you wait before being seen by our physicians.       _____________________________________________________________  Should you have questions after your visit to [facility], please contact our office at [phone] between the hours of 8:30 a.m. and 5:00 p.m.  Voicemails left after 4:30 p.m. will not be returned until the following business day.  For prescription refill requests, have your pharmacy contact our office with your prescription refill request.

## 2013-10-28 NOTE — Progress Notes (Signed)
Marissa Turner Pol Tolerated iron infusion today.  Discharged ambulatory

## 2013-10-31 ENCOUNTER — Encounter (HOSPITAL_BASED_OUTPATIENT_CLINIC_OR_DEPARTMENT_OTHER): Payer: Medicare Other

## 2013-10-31 ENCOUNTER — Encounter (HOSPITAL_COMMUNITY): Payer: Self-pay

## 2013-10-31 ENCOUNTER — Ambulatory Visit (INDEPENDENT_AMBULATORY_CARE_PROVIDER_SITE_OTHER): Payer: Medicare Other | Admitting: Pharmacist

## 2013-10-31 VITALS — BP 102/55 | HR 66 | Temp 98.0°F | Resp 18 | Wt 121.0 lb

## 2013-10-31 DIAGNOSIS — C8519 Unspecified B-cell lymphoma, extranodal and solid organ sites: Secondary | ICD-10-CM | POA: Diagnosis not present

## 2013-10-31 DIAGNOSIS — Z5111 Encounter for antineoplastic chemotherapy: Secondary | ICD-10-CM

## 2013-10-31 DIAGNOSIS — I482 Chronic atrial fibrillation, unspecified: Secondary | ICD-10-CM

## 2013-10-31 DIAGNOSIS — J449 Chronic obstructive pulmonary disease, unspecified: Secondary | ICD-10-CM | POA: Diagnosis not present

## 2013-10-31 DIAGNOSIS — F329 Major depressive disorder, single episode, unspecified: Secondary | ICD-10-CM | POA: Diagnosis not present

## 2013-10-31 DIAGNOSIS — F17210 Nicotine dependence, cigarettes, uncomplicated: Secondary | ICD-10-CM | POA: Diagnosis not present

## 2013-10-31 DIAGNOSIS — M47817 Spondylosis without myelopathy or radiculopathy, lumbosacral region: Secondary | ICD-10-CM | POA: Diagnosis not present

## 2013-10-31 DIAGNOSIS — Z90710 Acquired absence of both cervix and uterus: Secondary | ICD-10-CM | POA: Diagnosis not present

## 2013-10-31 DIAGNOSIS — C8330 Diffuse large B-cell lymphoma, unspecified site: Secondary | ICD-10-CM | POA: Diagnosis not present

## 2013-10-31 DIAGNOSIS — Z9079 Acquired absence of other genital organ(s): Secondary | ICD-10-CM | POA: Diagnosis not present

## 2013-10-31 DIAGNOSIS — Z7901 Long term (current) use of anticoagulants: Secondary | ICD-10-CM | POA: Diagnosis not present

## 2013-10-31 DIAGNOSIS — Z980 Intestinal bypass and anastomosis status: Secondary | ICD-10-CM | POA: Diagnosis not present

## 2013-10-31 DIAGNOSIS — Z5112 Encounter for antineoplastic immunotherapy: Secondary | ICD-10-CM | POA: Diagnosis not present

## 2013-10-31 DIAGNOSIS — C8580 Other specified types of non-Hodgkin lymphoma, unspecified site: Secondary | ICD-10-CM

## 2013-10-31 DIAGNOSIS — I10 Essential (primary) hypertension: Secondary | ICD-10-CM | POA: Diagnosis not present

## 2013-10-31 DIAGNOSIS — Z9089 Acquired absence of other organs: Secondary | ICD-10-CM | POA: Diagnosis not present

## 2013-10-31 DIAGNOSIS — Z5181 Encounter for therapeutic drug level monitoring: Secondary | ICD-10-CM

## 2013-10-31 LAB — CBC WITH DIFFERENTIAL/PLATELET
BASOS ABS: 0.1 10*3/uL (ref 0.0–0.1)
BASOS PCT: 1 % (ref 0–1)
EOS ABS: 0.3 10*3/uL (ref 0.0–0.7)
Eosinophils Relative: 3 % (ref 0–5)
HEMATOCRIT: 34.7 % — AB (ref 36.0–46.0)
Hemoglobin: 11.3 g/dL — ABNORMAL LOW (ref 12.0–15.0)
Lymphocytes Relative: 29 % (ref 12–46)
Lymphs Abs: 3 10*3/uL (ref 0.7–4.0)
MCH: 26.5 pg (ref 26.0–34.0)
MCHC: 32.6 g/dL (ref 30.0–36.0)
MCV: 81.3 fL (ref 78.0–100.0)
MONO ABS: 1 10*3/uL (ref 0.1–1.0)
Monocytes Relative: 10 % (ref 3–12)
Neutro Abs: 6 10*3/uL (ref 1.7–7.7)
Neutrophils Relative %: 57 % (ref 43–77)
PLATELETS: 353 10*3/uL (ref 150–400)
RBC: 4.27 MIL/uL (ref 3.87–5.11)
RDW: 18.1 % — AB (ref 11.5–15.5)
WBC: 10.4 10*3/uL (ref 4.0–10.5)

## 2013-10-31 LAB — COMPREHENSIVE METABOLIC PANEL
ALK PHOS: 69 U/L (ref 39–117)
ALT: 17 U/L (ref 0–35)
ANION GAP: 11 (ref 5–15)
AST: 20 U/L (ref 0–37)
Albumin: 3.4 g/dL — ABNORMAL LOW (ref 3.5–5.2)
BUN: 9 mg/dL (ref 6–23)
CHLORIDE: 103 meq/L (ref 96–112)
CO2: 28 mEq/L (ref 19–32)
Calcium: 8.7 mg/dL (ref 8.4–10.5)
Creatinine, Ser: 0.57 mg/dL (ref 0.50–1.10)
GFR calc non Af Amer: 90 mL/min — ABNORMAL LOW (ref >90)
Glucose, Bld: 107 mg/dL — ABNORMAL HIGH (ref 70–99)
Potassium: 4 mEq/L (ref 3.7–5.3)
Sodium: 142 mEq/L (ref 137–147)
TOTAL PROTEIN: 7.2 g/dL (ref 6.0–8.3)
Total Bilirubin: 0.2 mg/dL — ABNORMAL LOW (ref 0.3–1.2)

## 2013-10-31 LAB — SEDIMENTATION RATE: Sed Rate: 28 mm/hr — ABNORMAL HIGH (ref 0–22)

## 2013-10-31 LAB — LACTATE DEHYDROGENASE: LDH: 208 U/L (ref 94–250)

## 2013-10-31 LAB — PROTIME-INR
INR: 1.72 — ABNORMAL HIGH (ref 0.00–1.49)
Prothrombin Time: 20.2 seconds — ABNORMAL HIGH (ref 11.6–15.2)

## 2013-10-31 MED ORDER — LORATADINE 10 MG PO TABS
10.0000 mg | ORAL_TABLET | Freq: Every day | ORAL | Status: DC
  Administered 2013-10-31: 10 mg via ORAL
  Filled 2013-10-31: qty 1

## 2013-10-31 MED ORDER — SODIUM CHLORIDE 0.9 % IV SOLN
70.0000 mg/m2 | Freq: Once | INTRAVENOUS | Status: AC
  Administered 2013-10-31: 108 mg via INTRAVENOUS
  Filled 2013-10-31: qty 1.2

## 2013-10-31 MED ORDER — SODIUM CHLORIDE 0.9 % IV SOLN
375.0000 mg/m2 | Freq: Once | INTRAVENOUS | Status: AC
  Administered 2013-10-31: 50 mg via INTRAVENOUS
  Filled 2013-10-31: qty 60

## 2013-10-31 MED ORDER — ACETAMINOPHEN 325 MG PO TABS
650.0000 mg | ORAL_TABLET | Freq: Once | ORAL | Status: AC
  Administered 2013-10-31: 650 mg via ORAL
  Filled 2013-10-31: qty 2

## 2013-10-31 MED ORDER — SODIUM CHLORIDE 0.9 % IV SOLN
Freq: Once | INTRAVENOUS | Status: AC
  Administered 2013-10-31: 09:00:00 via INTRAVENOUS

## 2013-10-31 MED ORDER — HEPARIN SOD (PORK) LOCK FLUSH 100 UNIT/ML IV SOLN
500.0000 [IU] | Freq: Once | INTRAVENOUS | Status: AC | PRN
  Administered 2013-10-31: 500 [IU]
  Filled 2013-10-31: qty 5

## 2013-10-31 MED ORDER — FOSAPREPITANT DIMEGLUMINE INJECTION 150 MG
Freq: Once | INTRAVENOUS | Status: AC
  Administered 2013-10-31: 09:00:00 via INTRAVENOUS
  Filled 2013-10-31: qty 5

## 2013-10-31 MED ORDER — SODIUM CHLORIDE 0.9 % IV SOLN: 150.0000 mg | Freq: Once | INTRAVENOUS | Status: DC

## 2013-10-31 MED ORDER — PALONOSETRON HCL INJECTION 0.25 MG/5ML
0.2500 mg | Freq: Once | INTRAVENOUS | Status: AC
  Administered 2013-10-31: 0.25 mg via INTRAVENOUS
  Filled 2013-10-31: qty 5

## 2013-10-31 MED ORDER — SODIUM CHLORIDE 0.9 % IJ SOLN
10.0000 mL | INTRAMUSCULAR | Status: DC | PRN
  Administered 2013-10-31: 10 mL

## 2013-10-31 MED ORDER — DEXAMETHASONE SODIUM PHOSPHATE 10 MG/ML IJ SOLN: 10.0000 mg | Freq: Once | INTRAMUSCULAR | Status: DC

## 2013-10-31 NOTE — Progress Notes (Signed)
@  [place] notified Dr Bubba Hales of decrease in blood pressure.  Charina W Kawecki Tolerated chemotherapy well today.  Discharged ambulatory with her husband

## 2013-10-31 NOTE — Patient Instructions (Signed)
[place] Discharge Instructions for Patients Receiving Chemotherapy  Today you received the following chemotherapy agents rituxan and bendamustine Please call the clinic if you have any questions or concerns  To help prevent nausea and vomiting after your treatment, we encourage you to take your nausea medication    If you develop nausea and vomiting that is not controlled by your nausea medication, call the clinic.   BELOW ARE SYMPTOMS THAT SHOULD BE REPORTED IMMEDIATELY:  *FEVER GREATER THAN 100.5 F  *CHILLS WITH OR WITHOUT FEVER  NAUSEA AND VOMITING THAT IS NOT CONTROLLED WITH YOUR NAUSEA MEDICATION  *UNUSUAL SHORTNESS OF BREATH  *UNUSUAL BRUISING OR BLEEDING  TENDERNESS IN MOUTH AND THROAT WITH OR WITHOUT PRESENCE OF ULCERS  *URINARY PROBLEMS  *BOWEL PROBLEMS  UNUSUAL RASH Items with * indicate a potential emergency and should be followed up as soon as possible.  Feel free to call the clinic you have any questions or concerns. The clinic phone number is [phone].

## 2013-11-01 ENCOUNTER — Encounter (HOSPITAL_BASED_OUTPATIENT_CLINIC_OR_DEPARTMENT_OTHER): Payer: Medicare Other

## 2013-11-01 ENCOUNTER — Encounter: Payer: Self-pay | Admitting: *Deleted

## 2013-11-01 VITALS — BP 105/60 | HR 60 | Temp 97.8°F | Resp 18 | Wt 126.1 lb

## 2013-11-01 DIAGNOSIS — Z5111 Encounter for antineoplastic chemotherapy: Secondary | ICD-10-CM

## 2013-11-01 DIAGNOSIS — C8580 Other specified types of non-Hodgkin lymphoma, unspecified site: Secondary | ICD-10-CM

## 2013-11-01 DIAGNOSIS — C8330 Diffuse large B-cell lymphoma, unspecified site: Secondary | ICD-10-CM | POA: Diagnosis not present

## 2013-11-01 MED ORDER — SODIUM CHLORIDE 0.9 % IV SOLN
70.0000 mg/m2 | Freq: Once | INTRAVENOUS | Status: AC
  Administered 2013-11-01: 108 mg via INTRAVENOUS
  Filled 2013-11-01: qty 1.2

## 2013-11-01 MED ORDER — PALONOSETRON HCL INJECTION 0.25 MG/5ML
0.2500 mg | Freq: Once | INTRAVENOUS | Status: AC
  Administered 2013-11-01: 0.25 mg via INTRAVENOUS
  Filled 2013-11-01: qty 5

## 2013-11-01 MED ORDER — ACETAMINOPHEN 325 MG PO TABS
ORAL_TABLET | ORAL | Status: AC
  Filled 2013-11-01: qty 2

## 2013-11-01 MED ORDER — HEPARIN SOD (PORK) LOCK FLUSH 100 UNIT/ML IV SOLN
500.0000 [IU] | Freq: Once | INTRAVENOUS | Status: AC | PRN
  Administered 2013-11-01: 500 [IU]
  Filled 2013-11-01: qty 5

## 2013-11-01 MED ORDER — SODIUM CHLORIDE 0.9 % IJ SOLN
10.0000 mL | INTRAMUSCULAR | Status: DC | PRN
  Administered 2013-11-01: 10 mL

## 2013-11-01 MED ORDER — DEXAMETHASONE SODIUM PHOSPHATE 10 MG/ML IJ SOLN: 10.0000 mg | Freq: Once | INTRAMUSCULAR | Status: DC

## 2013-11-01 MED ORDER — SODIUM CHLORIDE 0.9 % IV SOLN
10.0000 mg | Freq: Once | INTRAVENOUS | Status: AC
  Administered 2013-11-01: 10 mg via INTRAVENOUS
  Filled 2013-11-01: qty 1

## 2013-11-01 MED ORDER — ACETAMINOPHEN 325 MG PO TABS
650.0000 mg | ORAL_TABLET | Freq: Four times a day (QID) | ORAL | Status: DC | PRN
  Administered 2013-11-01: 650 mg via ORAL

## 2013-11-01 MED ORDER — SODIUM CHLORIDE 0.9 % IV SOLN
Freq: Once | INTRAVENOUS | Status: AC
  Administered 2013-11-01: 09:00:00 via INTRAVENOUS

## 2013-11-01 NOTE — Progress Notes (Signed)
[place] Clinical Social Work  Clinical Social Work was referred by nurse for assessment of psychosocial needs.  Clinical Social Worker met with patient at [facility] to offer support and assess for needs.  CSW introduced self and explained role of CSW. Pt reports to be doing well and denies specific concerns at this time. Pt reports she has had multiple crises in her life; grief and loss, etc and feels this cancer is just another "hurdle to cross". She feels she has good support from her family and her granddaughter was with her today. No current concerns noted at this time. CSW provided pt with CSW handout and contact information. Pt appreciated contact and agrees to reach out to CSW as needed.     Clinical Social Work interventions: CSW role explanation  Loren Racer, [place] Tuesdays 8:30-1pm Wednesdays 8:30-12pm  [phone]

## 2013-11-01 NOTE — Progress Notes (Signed)
24 hour follow-up CARNETTA LOSADA done well last night.  No complaints of N/V/D.  No pain.

## 2013-11-01 NOTE — Progress Notes (Signed)
Hendrix W Rota Tolerated chermotherapy well today.  Discharged via wheelchair with visitor

## 2013-11-01 NOTE — Patient Instructions (Signed)
[place] Discharge Instructions for Patients Receiving Chemotherapy  Today you received the following chemotherapy agents bendamustine  Tomorrow you will come back for your injection and blood work.  To help prevent nausea and vomiting after your treatment, we encourage you to take your nausea medication   If you develop nausea and vomiting that is not controlled by your nausea medication, call the clinic.   BELOW ARE SYMPTOMS THAT SHOULD BE REPORTED IMMEDIATELY:  *FEVER GREATER THAN 100.5 F  *CHILLS WITH OR WITHOUT FEVER  NAUSEA AND VOMITING THAT IS NOT CONTROLLED WITH YOUR NAUSEA MEDICATION  *UNUSUAL SHORTNESS OF BREATH  *UNUSUAL BRUISING OR BLEEDING  TENDERNESS IN MOUTH AND THROAT WITH OR WITHOUT PRESENCE OF ULCERS  *URINARY PROBLEMS  *BOWEL PROBLEMS  UNUSUAL RASH Items with * indicate a potential emergency and should be followed up as soon as possible.  Feel free to call the clinic you have any questions or concerns. The clinic phone number is [phone].

## 2013-11-02 ENCOUNTER — Encounter (HOSPITAL_BASED_OUTPATIENT_CLINIC_OR_DEPARTMENT_OTHER): Payer: Medicare Other

## 2013-11-02 ENCOUNTER — Ambulatory Visit (HOSPITAL_COMMUNITY): Payer: Medicare Other

## 2013-11-02 VITALS — BP 119/62 | HR 55 | Resp 18

## 2013-11-02 DIAGNOSIS — C8580 Other specified types of non-Hodgkin lymphoma, unspecified site: Secondary | ICD-10-CM

## 2013-11-02 DIAGNOSIS — C8330 Diffuse large B-cell lymphoma, unspecified site: Secondary | ICD-10-CM | POA: Diagnosis not present

## 2013-11-02 DIAGNOSIS — Z5189 Encounter for other specified aftercare: Secondary | ICD-10-CM

## 2013-11-02 DIAGNOSIS — C8519 Unspecified B-cell lymphoma, extranodal and solid organ sites: Secondary | ICD-10-CM | POA: Diagnosis not present

## 2013-11-02 LAB — BETA 2 MICROGLOBULIN, SERUM: Beta-2 Microglobulin: 2.3 mg/L — ABNORMAL HIGH (ref <=2.51)

## 2013-11-02 LAB — BASIC METABOLIC PANEL
ANION GAP: 14 (ref 5–15)
BUN: 15 mg/dL (ref 6–23)
CHLORIDE: 101 meq/L (ref 96–112)
CO2: 26 mEq/L (ref 19–32)
Calcium: 8.3 mg/dL — ABNORMAL LOW (ref 8.4–10.5)
Creatinine, Ser: 0.57 mg/dL (ref 0.50–1.10)
GFR calc Af Amer: >90 mL/min (ref >90)
GFR calc non Af Amer: 90 mL/min — ABNORMAL LOW (ref >90)
Glucose, Bld: 146 mg/dL — ABNORMAL HIGH (ref 70–99)
Potassium: 3.5 mEq/L — ABNORMAL LOW (ref 3.7–5.3)
SODIUM: 141 meq/L (ref 137–147)

## 2013-11-02 LAB — URIC ACID: Uric Acid, Serum: 4.6 mg/dL (ref 2.4–7.0)

## 2013-11-02 LAB — VITAMIN B12: VITAMIN B 12: 437 pg/mL (ref 211–911)

## 2013-11-02 MED ORDER — PEGFILGRASTIM INJECTION 6 MG/0.6ML
6.0000 mg | Freq: Once | SUBCUTANEOUS | Status: AC
  Administered 2013-11-02: 6 mg via SUBCUTANEOUS
  Filled 2013-11-02: qty 0.6

## 2013-11-02 MED ORDER — HEPARIN SOD (PORK) LOCK FLUSH 100 UNIT/ML IV SOLN
500.0000 [IU] | Freq: Once | INTRAVENOUS | Status: AC
  Administered 2013-11-02: 500 [IU] via INTRAVENOUS

## 2013-11-02 MED ORDER — HEPARIN SOD (PORK) LOCK FLUSH 100 UNIT/ML IV SOLN
INTRAVENOUS | Status: AC
  Filled 2013-11-02: qty 5

## 2013-11-02 NOTE — Progress Notes (Signed)
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 She is asymptomatic. The potential risks and side effects of treatment have been reviewed with Marissa Turner and pretreatment lab tests have been completed. She is agreeable to proceeding. Her doses of Rituxan and Bendamustine are infusing today according to the standard premed regimen.    MEDICAL HISTORY:  Past Medical History  Diagnosis Date  . Atrial fibrillation, chronic 2004    left atrial thrombus in 2004; near syncope in 2008; echocardiogram in 2008-normal EF, mild left atrial enlargement, no valvular abnormalities, lipomatous hypertrophy; no aortic atheroma on TEE in 2004  . Chronic anticoagulation 2004    warfarin  . Tobacco abuse     40 pack years; quit attempt in 2012  . DJD (degenerative joint disease), lumbosacral     Also hip  . Peptic ulcer disease     s/p Billroth II  . Depression   . Fracture of wrist     Left  . Hypertension     Normal carotid ultrasound in 2008  . PONV (postoperative nausea and vomiting)   . Dysrhythmia     chronic AFib    has SPINAL STENOSIS; DJD (degenerative joint disease), lumbosacral; Atrial fibrillation, chronic; Chronic anticoagulation; Tobacco abuse; Peptic ulcer disease; Hypertension; Encounter for therapeutic drug monitoring; Lymphoma malignant, large cell; and Iron deficiency on her problem list.    ALLERGIES:  is allergic to diphenhydramine hcl; estrogens; penicillins; and codeine.  MEDICATIONS: has a current medication list  which includes the following prescription(s): acetaminophen, acyclovir, alprazolam, cetirizine, citalopram, digoxin, diltiazem, gabapentin, lidocaine-prilocaine, metoprolol succinate, prochlorperazine, and tramadol.  FAMILY HISTORY: family history includes Breast cancer in her sister and sister; Cancer in her brother; Cirrhosis in her sister; Heart attack in her brother and mother.  REVIEW OF SYSTEMS: Documented in prior visit and otherwise noncontributory.     PHYSICAL EXAMINATION:   vitals were not taken for this visit.   GENERAL: alert, no distress and comfortable SKIN: skin color, texture, turgor are normal, no rashes or significant lesions.  EYES: PERLA; Conjunctiva are pink and non-injected, sclera clear OROPHARYNX: no exudate, no erythema on lips, buccal mucosa, or tongue. No masses seen. NECK: supple without nodularity. No masses LYMPH:  no palpable lymphadenopathy in the cervical, axillary or inguinal regions LUNGS: clear to auscultation and percussion with normal breathing effort,  HEART: irregular rhythm with controlled rate.  ABDOMEN: abdomen soft, non-tender and normal bowel sounds and no masses  EXTREMITIES: No edema NEURO: alert & oriented x 3 with fluent speech, no focal motor/sensory deficits   LABORATORY DATA: Infusion on 10/31/2013  Component Date Value Ref Range Status  . Sodium 10/31/2013 142  137 - 147 mEq/L Final  . Potassium 10/31/2013 4.0  3.7 - 5.3 mEq/L Final  . Chloride 10/31/2013 103  96 - 112 mEq/L Final  . CO2 10/31/2013 28  19 - 32 mEq/L Final  . Glucose, Bld 10/31/2013 107* 70 - 99 mg/dL Final  . BUN 10/31/2013 9  6 - 23 mg/dL Final  . Creatinine, Ser 10/31/2013 0.57  0.50 -  1.10 mg/dL Final  . Calcium 10/31/2013 8.7  8.4 - 10.5 mg/dL Final  . Total Protein 10/31/2013 7.2  6.0 - 8.3 g/dL Final  . Albumin 10/31/2013 3.4* 3.5 - 5.2 g/dL Final  . AST 10/31/2013 20  0 - 37 U/L Final  . ALT 10/31/2013 17  0 - 35 U/L Final  . Alkaline Phosphatase  10/31/2013 69  39 - 117 U/L Final  . Total Bilirubin 10/31/2013 0.2* 0.3 - 1.2 mg/dL Final  . GFR calc non Af Amer 10/31/2013 90* >90 mL/min Final  . GFR calc Af Amer 10/31/2013 >90  >90 mL/min Final   Comment: (NOTE)                          The eGFR has been calculated using the CKD EPI equation.                          This calculation has not been validated in all clinical situations.                          eGFR's persistently <90 mL/min signify possible Chronic Kidney                          Disease.  . Anion gap 10/31/2013 11  5 - 15 Final  . LDH 10/31/2013 208  94 - 250 U/L Final  . Sed Rate 10/31/2013 28* 0 - 22 mm/hr Final  . Beta-2 Microglobulin 10/31/2013 2.30* <=2.51 mg/L Final   Performed at [facility]  . Prothrombin Time 10/31/2013 20.2* 11.6 - 15.2 seconds Final  . INR 10/31/2013 1.72* 0.00 - 1.49 Final  . WBC 10/31/2013 10.4  4.0 - 10.5 K/uL Final  . RBC 10/31/2013 4.27  3.87 - 5.11 MIL/uL Final  . Hemoglobin 10/31/2013 11.3* 12.0 - 15.0 g/dL Final  . HCT 10/31/2013 34.7* 36.0 - 46.0 % Final  . MCV 10/31/2013 81.3  78.0 - 100.0 fL Final  . MCH 10/31/2013 26.5  26.0 - 34.0 pg Final  . MCHC 10/31/2013 32.6  30.0 - 36.0 g/dL Final  . RDW 10/31/2013 18.1* 11.5 - 15.5 % Final  . Platelets 10/31/2013 353  150 - 400 K/uL Final  . Neutrophils Relative % 10/31/2013 57  43 - 77 % Final  . Neutro Abs 10/31/2013 6.0  1.7 - 7.7 K/uL Final  . Lymphocytes Relative 10/31/2013 29  12 - 46 % Final  . Lymphs Abs 10/31/2013 3.0  0.7 - 4.0 K/uL Final  . Monocytes Relative 10/31/2013 10  3 - 12 % Final  . Monocytes Absolute 10/31/2013 1.0  0.1 - 1.0 K/uL Final  . Eosinophils Relative 10/31/2013 3  0 - 5 % Final  . Eosinophils Absolute 10/31/2013 0.3  0.0 - 0.7 K/uL Final  . Basophils Relative 10/31/2013 1  0 - 1 % Final  . Basophils Absolute 10/31/2013 0.1  0.0 - 0.1 K/uL Final  Anti-coag visit on 10/24/2013  Component Date Value Ref Range Status  . INR 10/24/2013 1.0    Final  Hospital Outpatient Visit on 10/24/2013  Component Date Value Ref Range Status  . Sodium 10/24/2013 139  137 - 147 mEq/L Final  . Potassium 10/24/2013 4.2  3.7 - 5.3 mEq/L Final  . Chloride 10/24/2013 100  96 - 112 mEq/L Final  . CO2 10/24/2013 28  19 -  32 mEq/L Final  . Glucose, Bld 10/24/2013 99  70 - 99 mg/dL Final  . BUN 10/24/2013 8  6 - 23 mg/dL Final  . Creatinine, Ser 10/24/2013 0.61  0.50 - 1.10 mg/dL Final  . Calcium 10/24/2013 8.9  8.4 - 10.5 mg/dL Final  . GFR calc non Af Amer 10/24/2013 88* >90 mL/min Final  . GFR calc Af Amer 10/24/2013 >90  >90 mL/min Final   Comment: (NOTE)                          The eGFR has been calculated using the CKD EPI equation.                          This calculation has not been validated in all clinical situations.                          eGFR's persistently <90 mL/min signify possible Chronic Kidney                          Disease.  . Anion gap 10/24/2013 11  5 - 15 Final  . WBC 10/24/2013 11.1* 4.0 - 10.5 K/uL Final  . RBC 10/24/2013 4.59  3.87 - 5.11 MIL/uL Final  . Hemoglobin 10/24/2013 11.8* 12.0 - 15.0 g/dL Final  . HCT 10/24/2013 36.7  36.0 - 46.0 % Final  . MCV 10/24/2013 80.0  78.0 - 100.0 fL Final  . MCH 10/24/2013 25.7* 26.0 - 34.0 pg Final  . MCHC 10/24/2013 32.2  30.0 - 36.0 g/dL Final  . RDW 10/24/2013 17.5* 11.5 - 15.5 % Final  . Platelets 10/24/2013 389  150 - 400 K/uL Final  . Prothrombin Time 10/24/2013 12.4  11.6 - 15.2 seconds Final  . INR 10/24/2013 0.92  0.00 - 1.49 Final  Admission on 10/21/2013, Discharged on 10/21/2013  Component Date Value Ref Range Status  . Prothrombin Time 10/21/2013 13.5  11.6 - 15.2 seconds Final  . INR 10/21/2013 1.03  0.00 - 1.49 Final  Lab on 10/14/2013  Component Date Value Ref Range Status  . Hep B C IgM 10/14/2013 NON REACTIVE  NON REACTIVE Final   Comment: (NOTE)                          High levels of Hepatitis B Core IgM antibody are detectable                           during the acute stage of Hepatitis B. This antibody is used                          to differentiate current from past HBV infection.                          Performed at [facility]  . Hepatitis B Surface Ag 10/14/2013 NEGATIVE  NEGATIVE Final   Performed at [facility]  . Prothrombin Time 10/14/2013 17.0* 11.6 - 15.2 seconds Final  . INR 10/14/2013 1.38  0.00 - 1.49 Final  . Ferritin 10/14/2013 16  10 - 291 ng/mL Final   Performed at [facility] Visit on 10/10/2013  Component Date Value Ref Range  Status  . WBC 10/10/2013 11.7* 4.0 - 10.5 K/uL Final  . RBC 10/10/2013 4.84  3.87 - 5.11 MIL/uL Final  . Hemoglobin 10/10/2013 12.4  12.0 - 15.0 g/dL Final  . HCT 10/10/2013 39.5  36.0 - 46.0 % Final  . MCV 10/10/2013 81.6  78.0 - 100.0 fL Final  . MCH 10/10/2013 25.6* 26.0 - 34.0 pg Final  . MCHC 10/10/2013 31.4  30.0 - 36.0 g/dL Final  . RDW 10/10/2013 17.2* 11.5 - 15.5 % Final  . Platelets 10/10/2013 400  150 - 400 K/uL Final  . Neutrophils Relative % 10/10/2013 42* 43 - 77 % Final  . Neutro Abs 10/10/2013 4.9  1.7 - 7.7 K/uL Final  . Lymphocytes Relative 10/10/2013 45  12 - 46 % Final  . Lymphs Abs 10/10/2013 5.3* 0.7 - 4.0 K/uL Final  . Monocytes Relative 10/10/2013 9  3 - 12 % Final  . Monocytes Absolute 10/10/2013 1.1* 0.1 - 1.0 K/uL Final  . Eosinophils Relative 10/10/2013 3  0 - 5 % Final  . Eosinophils Absolute 10/10/2013 0.4  0.0 - 0.7 K/uL Final  . Basophils Relative 10/10/2013 1  0 - 1 % Final  . Basophils Absolute 10/10/2013 0.1  0.0 - 0.1 K/uL Final  . Retic Ct Pct 10/10/2013 1.0  0.4 - 3.1 % Final  . RBC. 10/10/2013 4.84  3.87 - 5.11 MIL/uL Final  . Retic Count, Manual 10/10/2013 48.4  19.0 - 186.0 K/uL Final  . Sodium 10/10/2013 141  137 - 147 mEq/L Final  . Potassium 10/10/2013 5.0  3.7 - 5.3 mEq/L Final  . Chloride 10/10/2013 101  96 - 112 mEq/L Final  . CO2 10/10/2013 30  19 - 32 mEq/L Final  . Glucose, Bld 10/10/2013 77  70 -  99 mg/dL Final  . BUN 10/10/2013 8  6 - 23 mg/dL Final  . Creatinine, Ser 10/10/2013 0.53  0.50 - 1.10 mg/dL Final  . Calcium 10/10/2013 9.2  8.4 - 10.5 mg/dL Final  . Total Protein 10/10/2013 7.8  6.0 - 8.3 g/dL Final  . Albumin 10/10/2013 3.8  3.5 - 5.2 g/dL Final  . AST 10/10/2013 27  0 - 37 U/L Final  . ALT 10/10/2013 24  0 - 35 U/L Final  . Alkaline Phosphatase 10/10/2013 70  39 - 117 U/L Final  . Total Bilirubin 10/10/2013 <0.2* 0.3 - 1.2 mg/dL Final  . GFR calc non Af Amer 10/10/2013 >90  >90 mL/min Final  . GFR calc Af Amer 10/10/2013 >90  >90 mL/min Final   Comment: (NOTE)                          The eGFR has been calculated using the CKD EPI equation.                          This calculation has not been validated in all clinical situations.                          eGFR's persistently <90 mL/min signify possible Chronic Kidney                          Disease.  . Anion gap 10/10/2013 10  5 - 15 Final  . LDH 10/10/2013 239  94 - 250 U/L Final  . Beta-2 Microglobulin 10/10/2013 2.28* <=2.51 mg/L Final  Performed at [facility] Out[facility] Visit on 10/06/2013  Component Date Value Ref Range Status  . Glucose-Capillary 10/06/2013 116* 70 - 99 mg/dL Final     RADIOGRAPHIC STUDIES: No new studies to review   ASSESSMENT:  1. Diffuse large B-cell NHL localized and nonbulky.   RECOMMENDATIONS:  1. Plan to treat for 4 cycles while assessing patient's tolerance and disease response with chemotherapy. 2. Hydrate liberally and recheck chemistries including uric acid on Day 3.            All questions were answered. The patient knows to call the clinic with any problems, questions or concerns. We can certainly see the patient much sooner if necessary.    Darrall Dears, MD 11/02/2013 8:45 PM

## 2013-11-02 NOTE — Progress Notes (Signed)
Marissa Turner's reason for visit today is for an injection and labs as scheduled per MD orders.  Labs were drawn prior to administration of ordered medication.   Marissa Turner also received neulasta per MD orders; see [facility] for administration details.  Marissa Turner tolerated all procedures well and without incident; questions were answered and patient was discharged.

## 2013-11-02 NOTE — Patient Instructions (Signed)
[facility] Discharge Instructions  RECOMMENDATIONS MADE BY THE CONSULTANT AND ANY TEST RESULTS WILL BE SENT TO YOUR REFERRING PHYSICIAN.  You received Neulasta today.  Come back Friday for iron infusion.  PLease call the clinic if you have any questions or concerns  Thank you for choosing [facility] to provide your oncology and hematology care.  To afford each patient quality time with our providers, please arrive at least 15 minutes before your scheduled appointment time.  With your help, our goal is to use those 15 minutes to complete the necessary work-up to ensure our physicians have the information they need to help with your evaluation and healthcare recommendations.    Effective January 1st, 2014, we ask that you re-schedule your appointment with our physicians should you arrive 10 or more minutes late for your appointment.  We strive to give you quality time with our providers, and arriving late affects you and other patients whose appointments are after yours.    Again, thank you for choosing [facility].  Our hope is that these requests will decrease the amount of time that you wait before being seen by our physicians.       _____________________________________________________________  Should you have questions after your visit to [facility], please contact our office at [phone] between the hours of 8:30 a.m. and 5:00 p.m.  Voicemails left after 4:30 p.m. will not be returned until the following business day.  For prescription refill requests, have your pharmacy contact our office with your prescription refill request.

## 2013-11-04 ENCOUNTER — Encounter (HOSPITAL_BASED_OUTPATIENT_CLINIC_OR_DEPARTMENT_OTHER): Payer: Medicare Other

## 2013-11-04 ENCOUNTER — Other Ambulatory Visit (HOSPITAL_COMMUNITY): Payer: Self-pay | Admitting: Oncology

## 2013-11-04 VITALS — BP 108/79 | HR 90 | Temp 98.2°F | Resp 18

## 2013-11-04 DIAGNOSIS — F419 Anxiety disorder, unspecified: Secondary | ICD-10-CM

## 2013-11-04 DIAGNOSIS — Z720 Tobacco use: Secondary | ICD-10-CM

## 2013-11-04 DIAGNOSIS — D509 Iron deficiency anemia, unspecified: Secondary | ICD-10-CM

## 2013-11-04 MED ORDER — SODIUM CHLORIDE 0.9 % IJ SOLN: 10.0000 mL | INTRAMUSCULAR | Status: DC | PRN

## 2013-11-04 MED ORDER — ALPRAZOLAM 1 MG PO TABS: 1.0000 mg | ORAL_TABLET | Freq: Three times a day (TID) | ORAL | Status: DC | PRN

## 2013-11-04 MED ORDER — SODIUM CHLORIDE 0.9 % IV SOLN
Freq: Once | INTRAVENOUS | Status: AC
  Administered 2013-11-04: 11:00:00 via INTRAVENOUS

## 2013-11-04 MED ORDER — HEPARIN SOD (PORK) LOCK FLUSH 100 UNIT/ML IV SOLN
500.0000 [IU] | Freq: Once | INTRAVENOUS | Status: AC | PRN
  Administered 2013-11-04: 500 [IU]

## 2013-11-04 MED ORDER — SODIUM CHLORIDE 0.9 % IV SOLN
510.0000 mg | Freq: Once | INTRAVENOUS | Status: AC
  Administered 2013-11-04: 510 mg via INTRAVENOUS
  Filled 2013-11-04: qty 17

## 2013-11-04 MED ORDER — HEPARIN SOD (PORK) LOCK FLUSH 100 UNIT/ML IV SOLN
INTRAVENOUS | Status: AC
  Filled 2013-11-04: qty 5

## 2013-11-04 NOTE — Progress Notes (Signed)
Marissa Turner Tolerated iron infusion well today

## 2013-11-04 NOTE — Patient Instructions (Signed)
[facility] Discharge Instructions  RECOMMENDATIONS MADE BY THE CONSULTANT AND ANY TEST RESULTS WILL BE SENT TO YOUR REFERRING PHYSICIAN.  You had an iron infusion today. Please call the clinic if you have any concerns or quesitons  Thank you for choosing [facility] to provide your oncology and hematology care.  To afford each patient quality time with our providers, please arrive at least 15 minutes before your scheduled appointment time.  With your help, our goal is to use those 15 minutes to complete the necessary work-up to ensure our physicians have the information they need to help with your evaluation and healthcare recommendations.    Effective January 1st, 2014, we ask that you re-schedule your appointment with our physicians should you arrive 10 or more minutes late for your appointment.  We strive to give you quality time with our providers, and arriving late affects you and other patients whose appointments are after yours.    Again, thank you for choosing [facility].  Our hope is that these requests will decrease the amount of time that you wait before being seen by our physicians.       _____________________________________________________________  Should you have questions after your visit to [facility], please contact our office at [phone] between the hours of 8:30 a.m. and 5:00 p.m.  Voicemails left after 4:30 p.m. will not be returned until the following business day.  For prescription refill requests, have your pharmacy contact our office with your prescription refill request.

## 2013-11-08 ENCOUNTER — Ambulatory Visit (HOSPITAL_COMMUNITY): Payer: Medicare Other | Admitting: Oncology

## 2013-11-09 ENCOUNTER — Encounter (HOSPITAL_COMMUNITY): Payer: Self-pay | Admitting: Oncology

## 2013-11-09 ENCOUNTER — Telehealth (HOSPITAL_COMMUNITY): Payer: Self-pay | Admitting: Emergency Medicine

## 2013-11-09 ENCOUNTER — Encounter (HOSPITAL_BASED_OUTPATIENT_CLINIC_OR_DEPARTMENT_OTHER): Payer: Medicare Other | Admitting: Oncology

## 2013-11-09 VITALS — BP 125/79 | HR 99 | Temp 98.4°F | Resp 18 | Wt 121.3 lb

## 2013-11-09 DIAGNOSIS — C8519 Unspecified B-cell lymphoma, extranodal and solid organ sites: Secondary | ICD-10-CM | POA: Diagnosis not present

## 2013-11-09 DIAGNOSIS — I4891 Unspecified atrial fibrillation: Secondary | ICD-10-CM | POA: Diagnosis not present

## 2013-11-09 DIAGNOSIS — C8331 Diffuse large B-cell lymphoma, lymph nodes of head, face, and neck: Secondary | ICD-10-CM | POA: Diagnosis not present

## 2013-11-09 DIAGNOSIS — C8580 Other specified types of non-Hodgkin lymphoma, unspecified site: Secondary | ICD-10-CM

## 2013-11-09 DIAGNOSIS — I10 Essential (primary) hypertension: Secondary | ICD-10-CM | POA: Diagnosis not present

## 2013-11-09 LAB — CBC WITH DIFFERENTIAL/PLATELET
BASOS PCT: 0 % (ref 0–1)
Basophils Absolute: 0.1 10*3/uL (ref 0.0–0.1)
EOS ABS: 0.3 10*3/uL (ref 0.0–0.7)
EOS PCT: 1 % (ref 0–5)
HCT: 37.7 % (ref 36.0–46.0)
Hemoglobin: 12.2 g/dL (ref 12.0–15.0)
LYMPHS ABS: 1.7 10*3/uL (ref 0.7–4.0)
Lymphocytes Relative: 6 % — ABNORMAL LOW (ref 12–46)
MCH: 27.2 pg (ref 26.0–34.0)
MCHC: 32.4 g/dL (ref 30.0–36.0)
MCV: 84.2 fL (ref 78.0–100.0)
MONOS PCT: 8 % (ref 3–12)
Monocytes Absolute: 2.4 10*3/uL — ABNORMAL HIGH (ref 0.1–1.0)
Neutro Abs: 24.4 10*3/uL — ABNORMAL HIGH (ref 1.7–7.7)
Neutrophils Relative %: 85 % — ABNORMAL HIGH (ref 43–77)
PLATELETS: 261 10*3/uL (ref 150–400)
RBC: 4.48 MIL/uL (ref 3.87–5.11)
RDW: 20.2 % — ABNORMAL HIGH (ref 11.5–15.5)
WBC MORPHOLOGY: INCREASED
WBC: 28.8 10*3/uL — ABNORMAL HIGH (ref 4.0–10.5)

## 2013-11-09 LAB — COMPREHENSIVE METABOLIC PANEL
ALT: 31 U/L (ref 0–35)
AST: 28 U/L (ref 0–37)
Albumin: 3.5 g/dL (ref 3.5–5.2)
Alkaline Phosphatase: 177 U/L — ABNORMAL HIGH (ref 39–117)
Anion gap: 10 (ref 5–15)
BUN: 8 mg/dL (ref 6–23)
CALCIUM: 9.1 mg/dL (ref 8.4–10.5)
CO2: 32 mEq/L (ref 19–32)
CREATININE: 0.61 mg/dL (ref 0.50–1.10)
Chloride: 101 mEq/L (ref 96–112)
GFR calc non Af Amer: 88 mL/min — ABNORMAL LOW (ref >90)
GLUCOSE: 100 mg/dL — AB (ref 70–99)
Potassium: 4.7 mEq/L (ref 3.7–5.3)
Sodium: 143 mEq/L (ref 137–147)
Total Bilirubin: 0.2 mg/dL — ABNORMAL LOW (ref 0.3–1.2)
Total Protein: 7 g/dL (ref 6.0–8.3)

## 2013-11-09 NOTE — Progress Notes (Signed)
[facility], MD [place] [address]  Lymphoma malignant, large cell - Plan: CBC with Differential, Comprehensive metabolic panel, CBC with Differential, Comprehensive metabolic panel  CURRENT THERAPY: S/P cycle 1 of bendamustine/Rituxan  INTERVAL HISTORY: Marissa Turner 73 y.o. 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   10/06/2013 PET scan Hypermetabolic thickening within the right vallecular consists with primary head and neck cancer.  Single enlarged hypermetabolic right level IIa lymph node consistent local nodal metastasis.   10/31/2013 -  Chemotherapy Bendamustine Rituxan   Today, she is here for follow-up of cycle 1 of chemotherapy to evaluate tolerability to medications.  She reports that for 2 days following chemotherapy, she experienced fatigue, but now, she is back to baseline.  With a quick recovery that she is describing, there is no need for any changes with treatment plan at this time.  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  She denies any urinary or bowel complaints.  Hematologically, she denies any complaints and ROS questioning is negative.    Past Medical History  Diagnosis Date  . Atrial fibrillation, chronic 2004    left atrial thrombus in 2004; near syncope in 2008; echocardiogram in 2008-normal EF, mild left atrial enlargement, no valvular abnormalities, lipomatous hypertrophy; no aortic atheroma on TEE in 2004  . Chronic anticoagulation 2004    warfarin  . Tobacco abuse     40 pack years; quit attempt in 2012  . DJD (degenerative joint disease), lumbosacral     Also hip  . Peptic ulcer disease     s/p Billroth II  . Depression   . Fracture of wrist     Left  .  Hypertension     Normal carotid ultrasound in 2008  . PONV (postoperative nausea and vomiting)   . Dysrhythmia     chronic AFib    has SPINAL STENOSIS; DJD (degenerative joint disease), lumbosacral; Atrial fibrillation, chronic; Chronic anticoagulation; Tobacco abuse; Peptic ulcer disease; Hypertension; Encounter for therapeutic drug monitoring; Lymphoma malignant, large cell; and Iron deficiency on her problem list.     is allergic to diphenhydramine hcl; estrogens; penicillins; and codeine.  Ms. Riddle does not currently have medications on file.  Past Surgical History  Procedure Laterality Date  . Cholecystectomy  2004    [place] II; splenectomy; incidental appendectomy  . Abdominal hysterectomy      w/o oophorectomy  . Cesarean section    . Tonsillectomy    . Colonoscopy  1980s    patient denies ever having undergone colonoscopy as of 2014  . Appendectomy    . Splenectomy, total    . Lymph node biopsy Left 09/15/2013    Procedure: LEFT NECK CERVICAL NODE BIOPSY;  Surgeon: Scherry Ran, MD;  Location: [facility];  Service: General;  Laterality: Left;  . Portacath placement Right 10/21/2013    Procedure: ATTEMPTED INSERTION PORT-A-CATH;  Surgeon: Scherry Ran, MD;  Location: [facility];  Service: General;  Laterality: Right;    Denies any headaches, dizziness, double vision, fevers, chills, night sweats, nausea, vomiting, diarrhea, constipation, chest pain, heart palpitations, shortness of breath, blood in stool, black tarry stool, urinary pain,  urinary burning, urinary frequency, hematuria.   PHYSICAL EXAMINATION  ECOG PERFORMANCE STATUS: 0 - Asymptomatic  Filed Vitals:   11/09/13 1100  BP: 125/79  Pulse: 99  Temp: 98.4 F (36.9 C)  Resp: 18    GENERAL:alert, no distress, well nourished, well developed, comfortable, cooperative and smiling SKIN: skin color, texture, turgor are normal, no rashes or significant  lesions HEAD: Normocephalic, No masses, lesions, tenderness or abnormalities EYES: normal, PERRLA, EOMI, Conjunctiva are pink and non-injected EARS: External ears normal OROPHARYNX:lips, buccal mucosa, and tongue normal and mucous membranes are moist  NECK: supple, no stridor, non-tender, trachea midline LYMPH:  no palpable lymphadenopathy BREAST:not examined LUNGS: clear to auscultation  HEART: regular rate & rhythm ABDOMEN:abdomen soft, non-tender and normal bowel sounds BACK: Back symmetric, no curvature. EXTREMITIES:less then 2 second capillary refill, no joint deformities, effusion, or inflammation, no skin discoloration, no clubbing, no cyanosis  NEURO: alert & oriented x 3 with fluent speech, no focal motor/sensory deficits, gait normal   LABORATORY DATA: CBC    Component Value Date/Time   WBC 10.4 10/31/2013 0830   RBC 4.27 10/31/2013 0830   RBC 4.84 10/10/2013 1502   HGB 11.3* 10/31/2013 0830   HCT 34.7* 10/31/2013 0830   PLT 353 10/31/2013 0830   MCV 81.3 10/31/2013 0830   MCH 26.5 10/31/2013 0830   MCHC 32.6 10/31/2013 0830   RDW 18.1* 10/31/2013 0830   LYMPHSABS 3.0 10/31/2013 0830   MONOABS 1.0 10/31/2013 0830   EOSABS 0.3 10/31/2013 0830   BASOSABS 0.1 10/31/2013 0830      Chemistry      Component Value Date/Time   NA 141 11/02/2013 1120   K 3.5* 11/02/2013 1120   CL 101 11/02/2013 1120   CO2 26 11/02/2013 1120   BUN 15 11/02/2013 1120   CREATININE 0.57 11/02/2013 1120   CREATININE 0.65 06/30/2013 0840      Component Value Date/Time   CALCIUM 8.3* 11/02/2013 1120   ALKPHOS 69 10/31/2013 0830   AST 20 10/31/2013 0830   ALT 17 10/31/2013 0830   BILITOT 0.2* 10/31/2013 0830       ASSESSMENT:  1. 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 Fatigue following cycle 1 lasting 2 days with quick recovery by today's appointment.  Patient Active Problem List   Diagnosis Date Noted  . Iron deficiency 10/17/2013  . Lymphoma malignant, large cell 10/10/2013  .  Encounter for therapeutic drug monitoring 02/24/2013  . DJD (degenerative joint disease), lumbosacral   . Atrial fibrillation, chronic   . Chronic anticoagulation   . Tobacco abuse   . Peptic ulcer disease   . Hypertension   . SPINAL STENOSIS 02/03/2008     PLAN:  1. I personally reviewed and went over laboratory results with the patient.  The results are noted within this dictation. 2. Labs today: CBC diff, CMET 3. Return in 11/2 for cycle 2 of chemotherapy.   THERAPY PLAN:  Continue with chemotherapy as planned.  All questions were answered. The patient knows to call the clinic with any problems, questions or concerns. We can certainly see the patient much sooner if necessary.  Patient and plan discussed with Dr. Farrel Gobble and he is in agreement with the aforementioned.   KEFALAS,THOMAS 11/09/2013

## 2013-11-09 NOTE — Progress Notes (Signed)
Marissa Turner's reason for visit today is for labs as scheduled per MD orders.  Venipuncture performed with a 23 gauge butterfly needle to L Antecubital.  Marissa Turner tolerated procedure well and without incident; questions were answered and patient was discharged.

## 2013-11-09 NOTE — Telephone Encounter (Signed)
Tried to call pt no answer

## 2013-11-09 NOTE — Patient Instructions (Signed)
[facility] Discharge Instructions  RECOMMENDATIONS MADE BY THE CONSULTANT AND ANY TEST RESULTS WILL BE SENT TO YOUR REFERRING PHYSICIAN.  EXAM FINDINGS BY THE PHYSICIAN TODAY AND SIGNS OR SYMPTOMS TO REPORT TO CLINIC OR PRIMARY PHYSICIAN: You seen Marissa Turner today  Blood work today.  Chemotherapy on 11/28/13 with doctors appt.  Please call the clinic if you have any questions or concerns  Thank you for choosing [facility] to provide your oncology and hematology care.  To afford each patient quality time with our providers, please arrive at least 15 minutes before your scheduled appointment time.  With your help, our goal is to use those 15 minutes to complete the necessary work-up to ensure our physicians have the information they need to help with your evaluation and healthcare recommendations.    Effective January 1st, 2014, we ask that you re-schedule your appointment with our physicians should you arrive 10 or more minutes late for your appointment.  We strive to give you quality time with our providers, and arriving late affects you and other patients whose appointments are after yours.    Again, thank you for choosing [facility].  Our hope is that these requests will decrease the amount of time that you wait before being seen by our physicians.       _____________________________________________________________  Should you have questions after your visit to [facility], please contact our office at [phone] between the hours of 8:30 a.m. and 5:00 p.m.  Voicemails left after 4:30 p.m. will not be returned until the following business day.  For prescription refill requests, have your pharmacy contact our office with your prescription refill request.

## 2013-11-14 ENCOUNTER — Ambulatory Visit (INDEPENDENT_AMBULATORY_CARE_PROVIDER_SITE_OTHER): Payer: Medicare Other | Admitting: *Deleted

## 2013-11-14 DIAGNOSIS — I482 Chronic atrial fibrillation, unspecified: Secondary | ICD-10-CM

## 2013-11-14 DIAGNOSIS — Z7901 Long term (current) use of anticoagulants: Secondary | ICD-10-CM

## 2013-11-14 DIAGNOSIS — Z5181 Encounter for therapeutic drug level monitoring: Secondary | ICD-10-CM

## 2013-11-14 DIAGNOSIS — I4891 Unspecified atrial fibrillation: Secondary | ICD-10-CM

## 2013-11-14 LAB — POCT INR: INR: 2.5

## 2013-11-21 ENCOUNTER — Inpatient Hospital Stay (HOSPITAL_COMMUNITY): Payer: Medicare Other

## 2013-11-21 ENCOUNTER — Ambulatory Visit (HOSPITAL_COMMUNITY): Payer: Medicare Other

## 2013-11-22 ENCOUNTER — Inpatient Hospital Stay (HOSPITAL_COMMUNITY): Payer: Medicare Other

## 2013-11-23 ENCOUNTER — Ambulatory Visit (HOSPITAL_COMMUNITY): Payer: Medicare Other

## 2013-11-24 NOTE — Progress Notes (Signed)
[name],[name], MD [place] [address]  Lymphoma malignant, large cell - Plan: NM PET Image Restag (PS) Skull Base To Thigh  CURRENT THERAPY: S/P cycle 1 of bendamustine/Rituxan  INTERVAL HISTORY: Marissa Turner 73 y.o. 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   10/06/2013 PET scan Hypermetabolic thickening within the right vallecular consists with primary head and neck cancer.  Single enlarged hypermetabolic right level IIa lymph node consistent local nodal metastasis.   10/31/2013 -  Chemotherapy Bendamustine Rituxan   I personally reviewed and went over laboratory results with the patient.  The results are noted within this dictation.  She is seen in the chemotherapy area in a bed receiving chemotherapy.  She reports that she tolerated therapy well without any complaints.   I explained to the patient that following this cycle (#2) we will have her undergo a PET scan to evaluate response to chemotherapy.  If complete response, she will be given 2 more cycles of chemotherapy.  If residual disease remains, she may need 4 more cycles of chemotherapy.   After the aforementioned discussion, the patient's demeanor changed. She appeared to be more depressed with this information than when I first entered the room. I've asked her about this, and she reports that she was hoping that following her initial surgery, with Dr. Romona Curls, she would not require any more therapy. Therefore me telling her that she may need more than 2 more cycles of chemotherapy following PET scan imaging is concerning for her. I've educated her on the role of PET scan following cycle 2 of chemotherapy which she is undergoing presently. The goal of this intervention is to evaluate response  to therapy and help guide future chemotherapeutic intervention. She asked well happened following 4 cycles or 6 cycles of chemotherapy depending on PET scan results and I have deferred this to a discussion for the future.  "will kind of cancer doing have."I have educated the patient on her diagnosis which is an early stage B-cell lymphoma. I provider education regarding lymphoma being a hematologic malignancy affecting lymph nodes. I educated her on the role of adjuvant chemotherapy which she is undergoing.  Oncologically, the patient denies any complaints and are was questioning is negative. She has tolerated chemotherapy well.   Past Medical History  Diagnosis Date  . Atrial fibrillation, chronic 2004    left atrial thrombus in 2004; near syncope in 2008; echocardiogram in 2008-normal EF, mild left atrial enlargement, no valvular abnormalities, lipomatous hypertrophy; no aortic atheroma on TEE in 2004  . Chronic anticoagulation 2004    warfarin  . Tobacco abuse     40 pack years; quit attempt in 2012  . DJD (degenerative joint disease), lumbosacral     Also hip  . Peptic ulcer disease     s/p [place] II  . Depression   . Fracture of wrist     Left  . Hypertension     Normal carotid ultrasound in 2008  . PONV (postoperative nausea and vomiting)   . Dysrhythmia     chronic AFib    has SPINAL STENOSIS; DJD (degenerative joint disease), lumbosacral; Atrial fibrillation, chronic; Chronic anticoagulation; Tobacco abuse; Peptic ulcer disease; Hypertension; Encounter for therapeutic drug monitoring; Lymphoma malignant, large cell; and Iron deficiency  on her problem list.     is allergic to diphenhydramine hcl; estrogens; penicillins; and codeine.  Marissa Turner does not currently have medications on file.  Past Surgical History  Procedure Laterality Date  . Cholecystectomy  2004    [place] II; splenectomy; incidental appendectomy  .  Abdominal hysterectomy      w/o oophorectomy  . Cesarean section    . Tonsillectomy    . Colonoscopy  1980s    patient denies ever having undergone colonoscopy as of 2014  . Appendectomy    . Splenectomy, total    . Lymph node biopsy Left 09/15/2013    Procedure: LEFT NECK CERVICAL NODE BIOPSY;  Surgeon: Scherry Ran, MD;  Location: [facility];  Service: General;  Laterality: Left;  . Portacath placement Right 10/21/2013    Procedure: ATTEMPTED INSERTION PORT-A-CATH;  Surgeon: Scherry Ran, MD;  Location: [facility];  Service: General;  Laterality: Right;    Denies any headaches, dizziness, double vision, fevers, chills, night sweats, nausea, vomiting, diarrhea, constipation, chest pain, heart palpitations, shortness of breath, blood in stool, black tarry stool, urinary pain, urinary burning, urinary frequency, hematuria.   PHYSICAL EXAMINATION  ECOG PERFORMANCE STATUS: 1 - Symptomatic but completely ambulatory  There were no vitals filed for this visit.  GENERAL:alert, no distress, well nourished, well developed, comfortable and cooperative SKIN: skin color, texture, turgor are normal, no rashes or significant lesions HEAD: Normocephalic, No masses, lesions, tenderness or abnormalities EYES: normal, PERRLA, EOMI, Conjunctiva are pink and non-injected EARS: External ears normal OROPHARYNX:mucous membranes are moist  NECK: supple, thyroid normal size, non-tender, without nodularity, no stridor, non-tender, trachea midline LYMPH:  no palpable lymphadenopathy BREAST:not examined LUNGS: clear to auscultation  HEART: regular rate & rhythm, no murmurs and no gallops ABDOMEN:abdomen soft, non-tender, normal bowel sounds and no masses or organomegaly BACK: Back symmetric, no curvature. EXTREMITIES:less then 2 second capillary refill, no joint deformities, effusion, or inflammation, no edema, no skin discoloration, no clubbing, no cyanosis  NEURO: alert & oriented x 3 with fluent speech,  no focal motor/sensory deficits, gait normal   LABORATORY DATA: CBC    Component Value Date/Time   WBC 12.3* 11/28/2013 0922   RBC 4.23 11/28/2013 0922   RBC 4.84 10/10/2013 1502   HGB 11.9* 11/28/2013 0922   HCT 36.5 11/28/2013 0922   PLT 324 11/28/2013 0922   MCV 86.3 11/28/2013 0922   MCH 28.1 11/28/2013 0922   MCHC 32.6 11/28/2013 0922   RDW 22.5* 11/28/2013 0922   LYMPHSABS 1.7 11/28/2013 0922   MONOABS 1.4* 11/28/2013 0922   EOSABS 0.8* 11/28/2013 0922   BASOSABS 0.1 11/28/2013 0922      Chemistry      Component Value Date/Time   NA 142 11/28/2013 0922   K 3.9 11/28/2013 0922   CL 104 11/28/2013 0922   CO2 28 11/28/2013 0922   BUN 9 11/28/2013 0922   CREATININE 0.59 11/28/2013 0922   CREATININE 0.65 06/30/2013 0840      Component Value Date/Time   CALCIUM 8.7 11/28/2013 0922   ALKPHOS 89 11/28/2013 0922   AST 27 11/28/2013 0922   ALT 27 11/28/2013 0922   BILITOT <0.2* 11/28/2013 0922        ASSESSMENT:  1. 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 Fatigue following cycle 1 lasting 2 days with quick recovery.  Patient Active Problem List   Diagnosis Date Noted  .  Iron deficiency 10/17/2013  . Lymphoma malignant, large cell 10/10/2013  . Encounter for therapeutic drug monitoring 02/24/2013  . DJD (degenerative joint disease), lumbosacral   . Atrial fibrillation, chronic   . Chronic anticoagulation   . Tobacco abuse   . Peptic ulcer disease   . Hypertension   . SPINAL STENOSIS 02/03/2008     PLAN:  1. I personally reviewed and went over laboratory results with the patient.  The results are noted within this dictation. 2. Pre-chemo labs today as planned: CBC diff, CMET 3. PET scan in 3 weeks to evaluate response to treatment. 4. Return in 3 weeks after PET scan to review results and discuss future treatment goals.    THERAPY PLAN:  Continue with chemotherapy as planned.  We will restage her with a PET scan in 3 weeks (prior to  cycle 3).  If complete response is noted, she will be given 2 more cycles of chemotherapy.  If residual disease is noted, she will undergo 4 more cycles of systemic chemotherapy.   All questions were answered. The patient knows to call the clinic with any problems, questions or concerns. We can certainly see the patient much sooner if necessary.  Patient and plan discussed with Dr. Farrel Gobble and he is in agreement with the aforementioned.   Marissa Turner 11/28/2013

## 2013-11-28 ENCOUNTER — Encounter (HOSPITAL_BASED_OUTPATIENT_CLINIC_OR_DEPARTMENT_OTHER): Payer: Medicare Other | Admitting: Oncology

## 2013-11-28 ENCOUNTER — Encounter (HOSPITAL_COMMUNITY): Payer: Medicare Other | Attending: Hematology and Oncology

## 2013-11-28 ENCOUNTER — Encounter (HOSPITAL_COMMUNITY): Payer: Self-pay

## 2013-11-28 DIAGNOSIS — Z9079 Acquired absence of other genital organ(s): Secondary | ICD-10-CM | POA: Diagnosis not present

## 2013-11-28 DIAGNOSIS — I4891 Unspecified atrial fibrillation: Secondary | ICD-10-CM

## 2013-11-28 DIAGNOSIS — F17210 Nicotine dependence, cigarettes, uncomplicated: Secondary | ICD-10-CM | POA: Diagnosis not present

## 2013-11-28 DIAGNOSIS — I10 Essential (primary) hypertension: Secondary | ICD-10-CM | POA: Diagnosis not present

## 2013-11-28 DIAGNOSIS — Z5111 Encounter for antineoplastic chemotherapy: Secondary | ICD-10-CM

## 2013-11-28 DIAGNOSIS — Z5112 Encounter for antineoplastic immunotherapy: Secondary | ICD-10-CM

## 2013-11-28 DIAGNOSIS — C8519 Unspecified B-cell lymphoma, extranodal and solid organ sites: Secondary | ICD-10-CM | POA: Insufficient documentation

## 2013-11-28 DIAGNOSIS — M47817 Spondylosis without myelopathy or radiculopathy, lumbosacral region: Secondary | ICD-10-CM | POA: Insufficient documentation

## 2013-11-28 DIAGNOSIS — C8331 Diffuse large B-cell lymphoma, lymph nodes of head, face, and neck: Secondary | ICD-10-CM

## 2013-11-28 DIAGNOSIS — Z980 Intestinal bypass and anastomosis status: Secondary | ICD-10-CM | POA: Insufficient documentation

## 2013-11-28 DIAGNOSIS — D500 Iron deficiency anemia secondary to blood loss (chronic): Secondary | ICD-10-CM

## 2013-11-28 DIAGNOSIS — C8580 Other specified types of non-Hodgkin lymphoma, unspecified site: Secondary | ICD-10-CM

## 2013-11-28 DIAGNOSIS — Z9089 Acquired absence of other organs: Secondary | ICD-10-CM | POA: Insufficient documentation

## 2013-11-28 DIAGNOSIS — F329 Major depressive disorder, single episode, unspecified: Secondary | ICD-10-CM | POA: Insufficient documentation

## 2013-11-28 DIAGNOSIS — I482 Chronic atrial fibrillation: Secondary | ICD-10-CM | POA: Insufficient documentation

## 2013-11-28 DIAGNOSIS — J449 Chronic obstructive pulmonary disease, unspecified: Secondary | ICD-10-CM | POA: Insufficient documentation

## 2013-11-28 DIAGNOSIS — Z90710 Acquired absence of both cervix and uterus: Secondary | ICD-10-CM | POA: Diagnosis not present

## 2013-11-28 DIAGNOSIS — Z7901 Long term (current) use of anticoagulants: Secondary | ICD-10-CM | POA: Insufficient documentation

## 2013-11-28 LAB — CBC WITH DIFFERENTIAL/PLATELET
BASOS ABS: 0.1 10*3/uL (ref 0.0–0.1)
Basophils Relative: 1 % (ref 0–1)
EOS PCT: 6 % — AB (ref 0–5)
Eosinophils Absolute: 0.8 10*3/uL — ABNORMAL HIGH (ref 0.0–0.7)
HCT: 36.5 % (ref 36.0–46.0)
Hemoglobin: 11.9 g/dL — ABNORMAL LOW (ref 12.0–15.0)
LYMPHS PCT: 14 % (ref 12–46)
Lymphs Abs: 1.7 10*3/uL (ref 0.7–4.0)
MCH: 28.1 pg (ref 26.0–34.0)
MCHC: 32.6 g/dL (ref 30.0–36.0)
MCV: 86.3 fL (ref 78.0–100.0)
Monocytes Absolute: 1.4 10*3/uL — ABNORMAL HIGH (ref 0.1–1.0)
Monocytes Relative: 11 % (ref 3–12)
NEUTROS ABS: 8.4 10*3/uL — AB (ref 1.7–7.7)
NEUTROS PCT: 68 % (ref 43–77)
PLATELETS: 324 10*3/uL (ref 150–400)
RBC: 4.23 MIL/uL (ref 3.87–5.11)
RDW: 22.5 % — AB (ref 11.5–15.5)
WBC: 12.3 10*3/uL — AB (ref 4.0–10.5)

## 2013-11-28 LAB — COMPREHENSIVE METABOLIC PANEL
ALK PHOS: 89 U/L (ref 39–117)
ALT: 27 U/L (ref 0–35)
AST: 27 U/L (ref 0–37)
Albumin: 3.2 g/dL — ABNORMAL LOW (ref 3.5–5.2)
Anion gap: 10 (ref 5–15)
BUN: 9 mg/dL (ref 6–23)
CO2: 28 meq/L (ref 19–32)
Calcium: 8.7 mg/dL (ref 8.4–10.5)
Chloride: 104 mEq/L (ref 96–112)
Creatinine, Ser: 0.59 mg/dL (ref 0.50–1.10)
GFR calc Af Amer: >90 mL/min (ref >90)
GFR, EST NON AFRICAN AMERICAN: 89 mL/min — AB (ref >90)
Glucose, Bld: 102 mg/dL — ABNORMAL HIGH (ref 70–99)
POTASSIUM: 3.9 meq/L (ref 3.7–5.3)
SODIUM: 142 meq/L (ref 137–147)
Total Bilirubin: <0.2 mg/dL — ABNORMAL LOW (ref 0.3–1.2)
Total Protein: 6.7 g/dL (ref 6.0–8.3)

## 2013-11-28 LAB — SEDIMENTATION RATE: SED RATE: 25 mm/h — AB (ref 0–22)

## 2013-11-28 LAB — LACTATE DEHYDROGENASE: LDH: 199 U/L (ref 94–250)

## 2013-11-28 MED ORDER — LORATADINE 10 MG PO TABS
ORAL_TABLET | ORAL | Status: AC
  Filled 2013-11-28: qty 1

## 2013-11-28 MED ORDER — ACETAMINOPHEN 325 MG PO TABS
650.0000 mg | ORAL_TABLET | Freq: Once | ORAL | Status: AC
  Administered 2013-11-28: 650 mg via ORAL

## 2013-11-28 MED ORDER — SODIUM CHLORIDE 0.9 % IV SOLN
70.0000 mg/m2 | Freq: Once | INTRAVENOUS | Status: AC
  Administered 2013-11-28: 108 mg via INTRAVENOUS
  Filled 2013-11-28: qty 1.2

## 2013-11-28 MED ORDER — SODIUM CHLORIDE 0.9 % IV SOLN: 8.0000 mg | Freq: Once | INTRAVENOUS | Status: DC

## 2013-11-28 MED ORDER — ACETAMINOPHEN 325 MG PO TABS
ORAL_TABLET | ORAL | Status: AC
  Filled 2013-11-28: qty 2

## 2013-11-28 MED ORDER — SODIUM CHLORIDE 0.9 % IV SOLN: 150.0000 mg | Freq: Once | INTRAVENOUS | Status: DC

## 2013-11-28 MED ORDER — SODIUM CHLORIDE 0.9 % IV SOLN
Freq: Once | INTRAVENOUS | Status: AC
  Administered 2013-11-28: 09:00:00 via INTRAVENOUS

## 2013-11-28 MED ORDER — SODIUM CHLORIDE 0.9 % IV SOLN
Freq: Once | INTRAVENOUS | Status: AC
  Administered 2013-11-28: 10:00:00 via INTRAVENOUS
  Filled 2013-11-28: qty 5

## 2013-11-28 MED ORDER — SODIUM CHLORIDE 0.9 % IJ SOLN: 10.0000 mL | INTRAMUSCULAR | Status: DC | PRN

## 2013-11-28 MED ORDER — PALONOSETRON HCL INJECTION 0.25 MG/5ML
INTRAVENOUS | Status: AC
  Filled 2013-11-28: qty 5

## 2013-11-28 MED ORDER — SODIUM CHLORIDE 0.9 % IV SOLN
375.0000 mg/m2 | Freq: Once | INTRAVENOUS | Status: AC
  Administered 2013-11-28: 600 mg via INTRAVENOUS
  Filled 2013-11-28: qty 60

## 2013-11-28 MED ORDER — DEXAMETHASONE SODIUM PHOSPHATE 10 MG/ML IJ SOLN: 10.0000 mg | Freq: Once | INTRAMUSCULAR | Status: DC

## 2013-11-28 MED ORDER — PALONOSETRON HCL INJECTION 0.25 MG/5ML
0.2500 mg | Freq: Once | INTRAVENOUS | Status: AC
  Administered 2013-11-28: 0.25 mg via INTRAVENOUS

## 2013-11-28 MED ORDER — LORATADINE 10 MG PO TABS
10.0000 mg | ORAL_TABLET | Freq: Every day | ORAL | Status: DC
  Administered 2013-11-28: 10 mg via ORAL

## 2013-11-28 MED ORDER — HEPARIN SOD (PORK) LOCK FLUSH 100 UNIT/ML IV SOLN
500.0000 [IU] | Freq: Once | INTRAVENOUS | Status: AC | PRN
  Administered 2013-11-28: 500 [IU]
  Filled 2013-11-28: qty 5

## 2013-11-28 NOTE — Patient Instructions (Signed)
[facility] Discharge Instructions for Patients Receiving Chemotherapy  Today you received the following chemotherapy agents:  Rituxan and Bendamustine   If you develop nausea and vomiting, or diarrhea that is not controlled by your medication, call the clinic.  The clinic phone number is [phone]. Office hours are Monday-Friday 8:30am-5:00pm.  BELOW ARE SYMPTOMS THAT SHOULD BE REPORTED IMMEDIATELY:  *FEVER GREATER THAN 101.0 F  *CHILLS WITH OR WITHOUT FEVER  NAUSEA AND VOMITING THAT IS NOT CONTROLLED WITH YOUR NAUSEA MEDICATION  *UNUSUAL SHORTNESS OF BREATH  *UNUSUAL BRUISING OR BLEEDING  TENDERNESS IN MOUTH AND THROAT WITH OR WITHOUT PRESENCE OF ULCERS  *URINARY PROBLEMS  *BOWEL PROBLEMS  UNUSUAL RASH Items with * indicate a potential emergency and should be followed up as soon as possible. If you have an emergency after office hours please contact your primary care physician or go to the nearest emergency department.  Please call the clinic during office hours if you have any questions or concerns.   You may also contact the Patient Navigator at [phone] should you have any questions or need assistance in obtaining follow up care. _____________________________________________________________________ Have you asked about our STAR program?    STAR stands for Survivorship Training and Rehabilitation, and this is a nationally recognized cancer care program that focuses on survivorship and rehabilitation.  Cancer and cancer treatments may cause problems, such as, pain, making you feel tired and keeping you from doing the things that you need or want to do. Cancer rehabilitation can help. Our goal is to reduce these troubling effects and help you have the best quality of life possible.  You may receive a survey from a nurse that asks questions about your current state of health.  Based on the survey results, all eligible patients will be referred to the  [facility] program for an evaluation so we can better serve you! A frequently asked questions sheet is available upon request.          Rituximab injection What is this medicine? RITUXIMAB (ri TUX i mab) is a monoclonal antibody. This medicine changes the way the body's immune system works. It is used commonly to treat non-Hodgkin's lymphoma and other conditions. In cancer cells, this drug targets a specific protein within cancer cells and stops the cancer cells from growing. It is also used to treat rhuematoid arthritis (RA). In RA, this medicine slow the inflammatory process and help reduce joint pain and swelling. This medicine is often used with other cancer or arthritis medications. This medicine may be used for other purposes; ask your health care provider or pharmacist if you have questions. COMMON BRAND NAME(S): Rituxan What should I tell my health care provider before I take this medicine? They need to know if you have any of these conditions: -blood disorders -heart disease -history of hepatitis B -infection (especially a virus infection such as chickenpox, cold sores, or herpes) -irregular heartbeat -kidney disease -lung or breathing disease, like asthma -lupus -an unusual or allergic reaction to rituximab, mouse proteins, other medicines, foods, dyes, or preservatives -pregnant or trying to get pregnant -breast-feeding How should I use this medicine? This medicine is for infusion into a vein. It is administered in a hospital or clinic by a specially trained health care professional. A special MedGuide will be given to you by the pharmacist with each prescription and refill. Be sure to read this information carefully each time. Talk to your pediatrician regarding the use of this medicine in children. This medicine  is not approved for use in children. Overdosage: If you think you have taken too much of this medicine contact a poison control center or emergency room at once. NOTE:  This medicine is only for you. Do not share this medicine with others. What if I miss a dose? It is important not to miss a dose. Call your doctor or health care professional if you are unable to keep an appointment. What may interact with this medicine? -cisplatin -medicines for blood pressure -some other medicines for arthritis -vaccines This list may not describe all possible interactions. Give your health care provider a list of all the medicines, herbs, non-prescription drugs, or dietary supplements you use. Also tell them if you smoke, drink alcohol, or use illegal drugs. Some items may interact with your medicine. What should I watch for while using this medicine? Report any side effects that you notice during your treatment right away, such as changes in your breathing, fever, chills, dizziness or lightheadedness. These effects are more common with the first dose. Visit your prescriber or health care professional for checks on your progress. You will need to have regular blood work. Report any other side effects. The side effects of this medicine can continue after you finish your treatment. Continue your course of treatment even though you feel ill unless your doctor tells you to stop. Call your doctor or health care professional for advice if you get a fever, chills or sore throat, or other symptoms of a cold or flu. Do not treat yourself. This drug decreases your body's ability to fight infections. Try to avoid being around people who are sick. This medicine may increase your risk to bruise or bleed. Call your doctor or health care professional if you notice any unusual bleeding. Be careful brushing and flossing your teeth or using a toothpick because you may get an infection or bleed more easily. If you have any dental work done, tell your dentist you are receiving this medicine. Avoid taking products that contain aspirin, acetaminophen, ibuprofen, naproxen, or ketoprofen unless instructed  by your doctor. These medicines may hide a fever. Do not become pregnant while taking this medicine. Women should inform their doctor if they wish to become pregnant or think they might be pregnant. There is a potential for serious side effects to an unborn child. Talk to your health care professional or pharmacist for more information. Do not breast-feed an infant while taking this medicine. What side effects may I notice from receiving this medicine? Side effects that you should report to your doctor or health care professional as soon as possible: -allergic reactions like skin rash, itching or hives, swelling of the face, lips, or tongue -low blood counts - this medicine may decrease the number of white blood cells, red blood cells and platelets. You may be at increased risk for infections and bleeding. -signs of infection - fever or chills, cough, sore throat, pain or difficulty passing urine -signs of decreased platelets or bleeding - bruising, pinpoint red spots on the skin, black, tarry stools, blood in the urine -signs of decreased red blood cells - unusually weak or tired, fainting spells, lightheadedness -breathing problems -confused, not responsive -chest pain -fast, irregular heartbeat -feeling faint or lightheaded, falls -mouth sores -redness, blistering, peeling or loosening of the skin, including inside the mouth -stomach pain -swelling of the ankles, feet, or hands -trouble passing urine or change in the amount of urine Side effects that usually do not require medical attention (report to your  doctor or other health care professional if they continue or are bothersome): -anxiety -headache -loss of appetite -muscle aches -nausea -night sweats This list may not describe all possible side effects. Call your doctor for medical advice about side effects. You may report side effects to FDA at 1-800-FDA-1088. Where should I keep my medicine? This drug is given in a hospital or  clinic and will not be stored at home. NOTE: This sheet is a summary. It may not cover all possible information. If you have questions about this medicine, talk to your doctor, pharmacist, or health care provider.  2015, Elsevier/Gold Standard. (2007-09-13 14:04:59) Bendamustine Injection What is this medicine? BENDAMUSTINE (BEN da MUS teen) is a chemotherapy drug. It is used to treat chronic lymphocytic leukemia and non-Hodgkin lymphoma. This medicine may be used for other purposes; ask your health care provider or pharmacist if you have questions. COMMON BRAND NAME(S): Treanda What should I tell my health care provider before I take this medicine? They need to know if you have any of these conditions: -kidney disease -liver disease -an unusual or allergic reaction to bendamustine, mannitol, other medicines, foods, dyes, or preservatives -pregnant or trying to get pregnant -breast-feeding How should I use this medicine? This medicine is for infusion into a vein. It is given by a health care professional in a hospital or clinic setting. Talk to your pediatrician regarding the use of this medicine in children. Special care may be needed. Overdosage: If you think you have taken too much of this medicine contact a poison control center or emergency room at once. NOTE: This medicine is only for you. Do not share this medicine with others. What if I miss a dose? It is important not to miss your dose. Call your doctor or health care professional if you are unable to keep an appointment. What may interact with this medicine? Do not take this medicine with any of the following medications: -clozapine This medicine may also interact with the following medications: -atazanavir -cimetidine -ciprofloxacin -enoxacin -fluvoxamine -medicines for seizures like carbamazepine and phenobarbital -mexiletine -rifampin -tacrine -thiabendazole -zileuton This list may not describe all possible  interactions. Give your health care provider a list of all the medicines, herbs, non-prescription drugs, or dietary supplements you use. Also tell them if you smoke, drink alcohol, or use illegal drugs. Some items may interact with your medicine. What should I watch for while using this medicine? Your condition will be monitored carefully while you are receiving this medicine. This drug may make you feel generally unwell. This is not uncommon, as chemotherapy can affect healthy cells as well as cancer cells. Report any side effects. Continue your course of treatment even though you feel ill unless your doctor tells you to stop. Call your doctor or health care professional for advice if you get a fever, chills or sore throat, or other symptoms of a cold or flu. Do not treat yourself. This drug decreases your body's ability to fight infections. Try to avoid being around people who are sick. This medicine may increase your risk to bruise or bleed. Call your doctor or health care professional if you notice any unusual bleeding. Be careful brushing and flossing your teeth or using a toothpick because you may get an infection or bleed more easily. If you have any dental work done, tell your dentist you are receiving this medicine. Avoid taking products that contain aspirin, acetaminophen, ibuprofen, naproxen, or ketoprofen unless instructed by your doctor. These medicines may hide a fever.  Do not become pregnant while taking this medicine. Women should inform their doctor if they wish to become pregnant or think they might be pregnant. There is a potential for serious side effects to an unborn child. Men should inform their doctors if they wish to father a child. This medicine may lower sperm counts. Talk to your health care professional or pharmacist for more information. Do not breast-feed an infant while taking this medicine. What side effects may I notice from receiving this medicine? Side effects that you  should report to your doctor or health care professional as soon as possible: -allergic reactions like skin rash, itching or hives, swelling of the face, lips, or tongue -low blood counts - this medicine may decrease the number of white blood cells, red blood cells and platelets. You may be at increased risk for infections and bleeding. -signs of infection - fever or chills, cough, sore throat, pain or difficulty passing urine -signs of decreased platelets or bleeding - bruising, pinpoint red spots on the skin, black, tarry stools, blood in the urine -signs of decreased red blood cells - unusually weak or tired, fainting spells, lightheadedness -trouble passing urine or change in the amount of urine Side effects that usually do not require medical attention (report to your doctor or health care professional if they continue or are bothersome): -diarrhea This list may not describe all possible side effects. Call your doctor for medical advice about side effects. You may report side effects to FDA at 1-800-FDA-1088. Where should I keep my medicine? This drug is given in a hospital or clinic and will not be stored at home. NOTE: This sheet is a summary. It may not cover all possible information. If you have questions about this medicine, talk to your doctor, pharmacist, or health care provider.  2015, Elsevier/Gold Standard. (2011-04-11 14:15:47)

## 2013-11-28 NOTE — Patient Instructions (Addendum)
[facility] Discharge Instructions   Continue with chemotherapy as planned. PET scan in 3 weeks to evaluate response to therapy Return in 3 after PET scan to review results and discuss future treatment goals.   Thank you for choosing [facility] to provide your oncology and hematology care.  To afford each patient quality time with our providers, please arrive at least 15 minutes before your scheduled appointment time.  With your help, our goal is to use those 15 minutes to complete the necessary work-up to ensure our physicians have the information they need to help with your evaluation and healthcare recommendations.    Effective January 1st, 2014, we ask that you re-schedule your appointment with our physicians should you arrive 10 or more minutes late for your appointment.  We strive to give you quality time with our providers, and arriving late affects you and other patients whose appointments are after yours.    Again, thank you for choosing [facility].  Our hope is that these requests will decrease the amount of time that you wait before being seen by our physicians.       _____________________________________________________________  Should you have questions after your visit to [facility], please contact our office at [phone] between the hours of 8:30 a.m. and 5:00 p.m.  Voicemails left after 4:30 p.m. will not be returned until the following business day.  For prescription refill requests, have your pharmacy contact our office with your prescription refill request.

## 2013-11-28 NOTE — Progress Notes (Signed)
1440:  Tolerated treatment well.  VSS.  A&Ox4, in no apparent distress.  Left in c/o spouse for transport home.

## 2013-11-29 ENCOUNTER — Encounter (HOSPITAL_BASED_OUTPATIENT_CLINIC_OR_DEPARTMENT_OTHER): Payer: Medicare Other

## 2013-11-29 DIAGNOSIS — C8331 Diffuse large B-cell lymphoma, lymph nodes of head, face, and neck: Secondary | ICD-10-CM | POA: Diagnosis not present

## 2013-11-29 DIAGNOSIS — Z5111 Encounter for antineoplastic chemotherapy: Secondary | ICD-10-CM | POA: Diagnosis not present

## 2013-11-29 DIAGNOSIS — C8580 Other specified types of non-Hodgkin lymphoma, unspecified site: Secondary | ICD-10-CM

## 2013-11-29 MED ORDER — SODIUM CHLORIDE 0.9 % IV SOLN
10.0000 mg | Freq: Once | INTRAVENOUS | Status: AC
  Administered 2013-11-29: 10 mg via INTRAVENOUS
  Filled 2013-11-29: qty 1

## 2013-11-29 MED ORDER — SODIUM CHLORIDE 0.9 % IV SOLN
70.0000 mg/m2 | Freq: Once | INTRAVENOUS | Status: AC
  Administered 2013-11-29: 108 mg via INTRAVENOUS
  Filled 2013-11-29: qty 1.2

## 2013-11-29 MED ORDER — SODIUM CHLORIDE 0.9 % IV SOLN
Freq: Once | INTRAVENOUS | Status: AC
  Administered 2013-11-29: 09:00:00 via INTRAVENOUS

## 2013-11-29 MED ORDER — SODIUM CHLORIDE 0.9 % IJ SOLN
10.0000 mL | INTRAMUSCULAR | Status: DC | PRN
  Administered 2013-11-29: 10 mL
  Filled 2013-11-29: qty 10

## 2013-11-29 MED ORDER — PALONOSETRON HCL INJECTION 0.25 MG/5ML
0.2500 mg | Freq: Once | INTRAVENOUS | Status: AC
Start: 2013-11-29 — End: 2013-11-29
  Administered 2013-11-29: 0.25 mg via INTRAVENOUS
  Filled 2013-11-29: qty 5

## 2013-11-29 MED ORDER — HEPARIN SOD (PORK) LOCK FLUSH 100 UNIT/ML IV SOLN
500.0000 [IU] | Freq: Once | INTRAVENOUS | Status: AC | PRN
  Administered 2013-11-29: 500 [IU]
  Filled 2013-11-29: qty 5

## 2013-11-29 NOTE — Progress Notes (Signed)
Tolerated chemo well. 

## 2013-11-29 NOTE — Patient Instructions (Signed)
[facility] Discharge Instructions for Patients Receiving Chemotherapy  Today you received the following chemotherapy agents Bendamustine Day 2 Cycle 2. Return to clinic tomorrow as scheduled for Neulasta injection.  To help prevent nausea and vomiting after your treatment, we encourage you to take your nausea medication as instructed.   If you develop nausea and vomiting that is not controlled by your nausea medication, call the clinic. If it is after clinic hours your family physician or the after hours number for the clinic or go to the Emergency Department.   BELOW ARE SYMPTOMS THAT SHOULD BE REPORTED IMMEDIATELY:  *FEVER GREATER THAN 101.0 F  *CHILLS WITH OR WITHOUT FEVER  NAUSEA AND VOMITING THAT IS NOT CONTROLLED WITH YOUR NAUSEA MEDICATION  *UNUSUAL SHORTNESS OF BREATH  *UNUSUAL BRUISING OR BLEEDING  TENDERNESS IN MOUTH AND THROAT WITH OR WITHOUT PRESENCE OF ULCERS  *URINARY PROBLEMS  *BOWEL PROBLEMS  UNUSUAL RASH Items with * indicate a potential emergency and should be followed up as soon as possible.  I have been informed and understand all the instructions given to me. I know to contact the clinic, my physician, or go to the Emergency Department if any problems should occur. I do not have any questions at this time, but understand that I may call the clinic during office hours or the Patient Navigator at [phone] should I have any questions or need assistance in obtaining follow up care.    __________________________________________  _____________  __________ Signature of Patient or Authorized Representative            Date                   Time    __________________________________________ Nurse's Signature

## 2013-11-30 ENCOUNTER — Encounter (HOSPITAL_BASED_OUTPATIENT_CLINIC_OR_DEPARTMENT_OTHER): Payer: Medicare Other

## 2013-11-30 DIAGNOSIS — Z5189 Encounter for other specified aftercare: Secondary | ICD-10-CM | POA: Diagnosis not present

## 2013-11-30 DIAGNOSIS — C8580 Other specified types of non-Hodgkin lymphoma, unspecified site: Secondary | ICD-10-CM

## 2013-11-30 DIAGNOSIS — C8331 Diffuse large B-cell lymphoma, lymph nodes of head, face, and neck: Secondary | ICD-10-CM

## 2013-11-30 LAB — BETA 2 MICROGLOBULIN, SERUM: Beta-2 Microglobulin: 2.99 mg/L — ABNORMAL HIGH (ref <=2.51)

## 2013-11-30 MED ORDER — PEGFILGRASTIM INJECTION 6 MG/0.6ML ~~LOC~~
PREFILLED_SYRINGE | SUBCUTANEOUS | Status: AC
  Filled 2013-11-30: qty 0.6

## 2013-11-30 MED ORDER — PEGFILGRASTIM INJECTION 6 MG/0.6ML
6.0000 mg | Freq: Once | SUBCUTANEOUS | Status: AC
  Administered 2013-11-30: 6 mg via SUBCUTANEOUS

## 2013-11-30 NOTE — Patient Instructions (Signed)
[place] Discharge Instructions  RECOMMENDATIONS MADE BY THE CONSULTANT AND ANY TEST RESULTS WILL BE SENT TO YOUR REFERRING PHYSICIAN.  INSTRUCTIONS/FOLLOW-UP: Neulasta injection today. Report any issues/concerns to clinic prior to next appointment. Return as scheduled.  Thank you for choosing [facility] to provide your oncology and hematology care.  To afford each patient quality time with our providers, please arrive at least 15 minutes before your scheduled appointment time.  With your help, our goal is to use those 15 minutes to complete the necessary work-up to ensure our physicians have the information they need to help with your evaluation and healthcare recommendations.    Effective January 1st, 2014, we ask that you re-schedule your appointment with our physicians should you arrive 10 or more minutes late for your appointment.  We strive to give you quality time with our providers, and arriving late affects you and other patients whose appointments are after yours.    Again, thank you for choosing [facility].  Our hope is that these requests will decrease the amount of time that you wait before being seen by our physicians.       _____________________________________________________________  Should you have questions after your visit to [facility], please contact our office at [phone] between the hours of 8:30 a.m. and 4:30 p.m.  Voicemails left after 4:30 p.m. will not be returned until the following business day.  For prescription refill requests, have your pharmacy contact our office with your prescription refill request.    _______________________________________________________________  We hope that we have given you very good care.  You may receive a patient satisfaction survey in the mail, please complete it and return it as soon as possible.  We value your  feedback!  _______________________________________________________________  Have you asked about our STAR program?  STAR stands for Survivorship Training and Rehabilitation, and this is a nationally recognized cancer care program that focuses on survivorship and rehabilitation.  Cancer and cancer treatments may cause problems, such as, pain, making you feel tired and keeping you from doing the things that you need or want to do. Cancer rehabilitation can help. Our goal is to reduce these troubling effects and help you have the best quality of life possible.  You may receive a survey from a nurse that asks questions about your current state of health.  Based on the survey results, all eligible patients will be referred to the [facility] program for an evaluation so we can better serve you!  A frequently asked questions sheet is available upon request.

## 2013-11-30 NOTE — Progress Notes (Signed)
Marissa Turner presents today for injection per MD orders. Neulasta 6mg  administered SQ in left Abdomen. Administration without incident. Patient tolerated well. Reports mild nausea yesterday evening after chemo but was able to eat and did not require any antiemetics. No complaints voiced today. 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

## 2013-12-02 ENCOUNTER — Other Ambulatory Visit (HOSPITAL_COMMUNITY): Payer: Self-pay | Admitting: *Deleted

## 2013-12-02 ENCOUNTER — Other Ambulatory Visit (HOSPITAL_COMMUNITY): Payer: Self-pay | Admitting: Oncology

## 2013-12-02 DIAGNOSIS — C8580 Other specified types of non-Hodgkin lymphoma, unspecified site: Secondary | ICD-10-CM

## 2013-12-02 DIAGNOSIS — R0781 Pleurodynia: Secondary | ICD-10-CM

## 2013-12-02 MED ORDER — TRAMADOL HCL 50 MG PO TABS: 100.0000 mg | ORAL_TABLET | Freq: Two times a day (BID) | ORAL | Status: DC | PRN

## 2013-12-02 MED ORDER — ONDANSETRON HCL 8 MG PO TABS: 8.0000 mg | ORAL_TABLET | Freq: Three times a day (TID) | ORAL | Status: DC | PRN

## 2013-12-05 ENCOUNTER — Ambulatory Visit (HOSPITAL_COMMUNITY)
Admission: RE | Admit: 2013-12-05 | Discharge: 2013-12-05 | Disposition: A | Discharge status: Home / Self Care | Payer: Medicare Other | Source: Ambulatory Visit | Attending: Oncology | Admitting: Oncology

## 2013-12-05 ENCOUNTER — Ambulatory Visit (INDEPENDENT_AMBULATORY_CARE_PROVIDER_SITE_OTHER): Payer: Medicare Other | Admitting: *Deleted

## 2013-12-05 DIAGNOSIS — S2231XA Fracture of one rib, right side, initial encounter for closed fracture: Secondary | ICD-10-CM | POA: Diagnosis not present

## 2013-12-05 DIAGNOSIS — I4891 Unspecified atrial fibrillation: Secondary | ICD-10-CM | POA: Diagnosis not present

## 2013-12-05 DIAGNOSIS — W1830XA Fall on same level, unspecified, initial encounter: Secondary | ICD-10-CM | POA: Diagnosis not present

## 2013-12-05 DIAGNOSIS — R0781 Pleurodynia: Secondary | ICD-10-CM | POA: Insufficient documentation

## 2013-12-05 DIAGNOSIS — I482 Chronic atrial fibrillation, unspecified: Secondary | ICD-10-CM

## 2013-12-05 DIAGNOSIS — Z5181 Encounter for therapeutic drug level monitoring: Secondary | ICD-10-CM

## 2013-12-05 DIAGNOSIS — Z7901 Long term (current) use of anticoagulants: Secondary | ICD-10-CM | POA: Diagnosis not present

## 2013-12-05 LAB — POCT INR: INR: 3.2

## 2013-12-18 NOTE — Progress Notes (Signed)
[place], MD [place] [address]  Lymphoma malignant, large cell - Plan: ALPRAZolam (XANAX) 0.5 MG tablet, NM Cardiac [facility]  Atrial fibrillation, chronic - Plan: ALPRAZolam (XANAX) 0.5 MG tablet, NM Cardiac [facility]  CURRENT THERAPY: S/P cycle 2 of bendamustine/Rituxan  INTERVAL HISTORY: Marissa Turner 73 y.o. 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   10/06/2013 PET scan Hypermetabolic thickening within the right vallecular consists with primary head and neck cancer.  Single enlarged hypermetabolic right level IIa lymph node consistent local nodal metastasis.   10/31/2013 - 11/29/2013 Chemotherapy Bendamustine/Rituxan x 2 cycles   12/19/2013 Progression PET- Progression of presumed vallecular lymphoma as evidenced by increased hypermetabolism.   I personally reviewed and went over laboratory results with the patient.  The results are noted within this dictation.  I personally reviewed and went over radiographic studies with the patient.  The results are noted within this dictation.  PET scan on 12/19/2013 demonstrates the following information: Progression of presumed vallecular lymphoma as evidenced by increased hypermetabolism.  With this information, the patient is deemed to have progressive disease. As a result, we need to change therapy.  We placed a call to our pathologist to see if he is able to let us know if this patient has a germinal center or non-germinal center non-Hodgkin's lymphoma as this may help guide future therapy. She does have atrial fibrillation and therefore before we start on therapy, a MUGA scan is indicated. I offered her a chemotherapy regimen consisting of R-CHOP if her MUGA scan is appropriate. I reviewed the  risks, benefits, alternatives, and side effects of this therapy including nausea, vomiting, diarrhea, constipation, alopecia, decreased blood counts, increased risk for infection, and death. Additionally, his offered her a second opinion at [facility] which she declined.  She is agreeable to pursue this intervention following MUGA scan with chemotherapy teaching. I will cancer her future bendamustine and Rituxan regimen as we will move on to her next line of therapy.  Hematologically, the patient denies any complaints and are less questioning is negative.  Past Medical History  Diagnosis Date  . Atrial fibrillation, chronic 2004    left atrial thrombus in 2004; near syncope in 2008; echocardiogram in 2008-normal EF, mild left atrial enlargement, no valvular abnormalities, lipomatous hypertrophy; no aortic atheroma on TEE in 2004  . Chronic anticoagulation 2004    warfarin  . Tobacco abuse     40 pack years; quit attempt in 2012  . DJD (degenerative joint disease), lumbosacral     Also hip  . Peptic ulcer disease     s/p Billroth II  . Depression   . Fracture of wrist     Left  . Hypertension     Normal carotid ultrasound in 2008  . PONV (postoperative nausea and vomiting)   . Dysrhythmia     chronic AFib    has SPINAL STENOSIS; DJD (degenerative joint disease), lumbosacral; Atrial fibrillation, chronic; Chronic anticoagulation; Tobacco abuse; Peptic ulcer disease; Hypertension; Encounter for therapeutic drug monitoring; Lymphoma malignant, large cell; and Iron deficiency on her problem list.     is allergic to diphenhydramine hcl; estrogens; penicillins; and codeine.  Marissa Turner does not currently have medications on file.  Past Surgical  History  Procedure Laterality Date  . Cholecystectomy  2004    Trumann II; splenectomy; incidental appendectomy  . Abdominal hysterectomy      w/o oophorectomy  .  Cesarean section    . Tonsillectomy    . Colonoscopy  1980s    patient denies ever having undergone colonoscopy as of 2014  . Appendectomy    . Splenectomy, total    . Lymph node biopsy Left 09/15/2013    Procedure: LEFT NECK CERVICAL NODE BIOPSY;  Surgeon: Scherry Ran, MD;  Location: [facility];  Service: General;  Laterality: Left;  . Portacath placement Right 10/21/2013    Procedure: ATTEMPTED INSERTION PORT-A-CATH;  Surgeon: Scherry Ran, MD;  Location: [facility];  Service: General;  Laterality: Right;    Denies any headaches, dizziness, double vision, fevers, chills, night sweats, nausea, vomiting, diarrhea, constipation, chest pain, heart palpitations, shortness of breath, blood in stool, black tarry stool, urinary pain, urinary burning, urinary frequency, hematuria.   PHYSICAL EXAMINATION  ECOG PERFORMANCE STATUS: 1 - Symptomatic but completely ambulatory  Filed Vitals:   12/20/13 1000  BP: 139/62  Pulse: 59  Temp: 97.6 F (36.4 C)  Resp: 16    GENERAL:alert, no distress, comfortable, cooperative and upset with data I provided her today SKIN: skin color, texture, turgor are normal, no rashes or significant lesions HEAD: Normocephalic, No masses, lesions, tenderness or abnormalities EYES: normal, PERRLA, EOMI, Conjunctiva are pink and non-injected EARS: External ears normal OROPHARYNX:mucous membranes are moist  NECK: supple, trachea midline LYMPH:  not examined BREAST:not examined LUNGS: not examined HEART: not examined ABDOMEN:not examined BACK: Back symmetric, no curvature. EXTREMITIES:less then 2 second capillary refill, no joint deformities, effusion, or inflammation, no skin discoloration, no clubbing, no cyanosis  NEURO: alert & oriented x 3 with fluent speech, no focal motor/sensory deficits   LABORATORY DATA: CBC    Component Value Date/Time   WBC 12.3* 11/28/2013 0922   RBC 4.23 11/28/2013 0922   RBC 4.84 10/10/2013 1502   HGB 11.9* 11/28/2013  0922   HCT 36.5 11/28/2013 0922   PLT 324 11/28/2013 0922   MCV 86.3 11/28/2013 0922   MCH 28.1 11/28/2013 0922   MCHC 32.6 11/28/2013 0922   RDW 22.5* 11/28/2013 0922   LYMPHSABS 1.7 11/28/2013 0922   MONOABS 1.4* 11/28/2013 0922   EOSABS 0.8* 11/28/2013 0922   BASOSABS 0.1 11/28/2013 0922      Chemistry      Component Value Date/Time   NA 142 11/28/2013 0922   K 3.9 11/28/2013 0922   CL 104 11/28/2013 0922   CO2 28 11/28/2013 0922   BUN 9 11/28/2013 0922   CREATININE 0.59 11/28/2013 0922   CREATININE 0.65 06/30/2013 0840      Component Value Date/Time   CALCIUM 8.7 11/28/2013 0922   ALKPHOS 89 11/28/2013 0922   AST 27 11/28/2013 0922   ALT 27 11/28/2013 0922   BILITOT <0.2* 11/28/2013 0922        RADIOGRAPHIC STUDIES:  Nm Pet Image Restag (ps) Skull Base To Thigh  12/19/2013   CLINICAL DATA:  Subsequent Treatment strategy for large cell non-Hodgkin's lymphoma. Restaging.  EXAM: NUCLEAR MEDICINE PET SKULL BASE TO THIGH  TECHNIQUE: 6.3 mCi F-18 FDG was injected intravenously. Full-ring PET imaging was performed from the skull base to thigh after the radiotracer. CT data was obtained and used for attenuation correction and anatomic localization.  FASTING BLOOD GLUCOSE:  Value: 100 for  mg/dl  COMPARISON:  10/06/2013  FINDINGS: NECK  Similar soft tissue fullness corresponding to hypermetabolism within the right vallecula. This measures a S.U.V. max of 27.3 on image 26 is series 4. On the prior exam, this measured a S.U.V. max of 13.9.  A right level 2 node measures 1.2 x 1.6 cm and a S.U.V. max of 32.5 on image 29. This measured 1.2 x 1.3 cm and a S.U.V. max of 32.0 on the prior exam.  CHEST  No areas of abnormal hypermetabolism.  ABDOMEN/PELVIS  Probable urinary contamination about the labia.  SKELETON  Mild hypermetabolism corresponds to right tenth and eleventh rib fractures. Example image 103 of series 4.  CT IMAGES PERFORMED FOR ATTENUATION CORRECTION  No other enlarged  cervical nodes.  A right-sided Port-A-Cath which terminates at the mid right atrium. Mild cardiomegaly with coronary artery atherosclerosis. Right paratracheal node is upper normal in size and unchanged at 1.0 cm. Not hypermetabolic. Surgical changes at the gastroesophageal junction.  Centrilobular emphysema. A left upper lobe pulmonary nodule is unchanged at 9 mm, not hypermetabolic.  Bilateral low-density renal lesions which are likely cysts. Mild left adrenal nodularity. Moderate hepatic steatosis. Advanced hip osteoarthritis, greater on the left.  IMPRESSION: 1. Progression of presumed vallecular lymphoma as evidenced by increased hypermetabolism. 2. Similar right-sided level II hypermetabolic node. 3. No evidence of extracervical disease. 4. Similar left upper lobe pulmonary nodule which is not hypermetabolic. Nonspecific. This could be post infectious /inflammatory or represent a non FDG avid primary bronchogenic carcinoma. Recommend attention on follow-up. 5. Hepatic steatosis. 6. Interval posttraumatic right sided tenth and eleventh rib fractures with secondary hypermetabolism.   Electronically Signed   By: Abigail Miyamoto M.D.   On: 12/19/2013 13:15     ASSESSMENT:  1. 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 2. Fatigue following cycle 1 lasting 2 days with quick recovery.  Patient Active Problem List   Diagnosis Date Noted  . Iron deficiency 10/17/2013  . Lymphoma malignant, large cell 10/10/2013  . Encounter for therapeutic drug monitoring 02/24/2013  . DJD (degenerative joint disease), lumbosacral   . Atrial fibrillation, chronic   . Chronic anticoagulation   . Tobacco abuse   . Peptic ulcer disease   . Hypertension   . SPINAL STENOSIS 02/03/2008     PLAN:  1. I personally reviewed and went over laboratory results with the patient.  The results are noted within this dictation. 2. I  personally reviewed and went over radiographic studies with the patient.  The results are noted within this dictation.   3. Pre-chemo labs as planned next week. 4. MUGA scan in the next week or so. 5. BR treatment plan discontinued due to progression 6. Chemotherapy teaching for R-CHOP (if MUGA scan is WNL) by our Nurse Navigator. 7. R-CHOP chemotherapy plan built 8. Patient declined a second opinion at [facility] 9. Awaiting a return call from pathology regarding germinal center status of malignancy (if identified) to help guide therapy and consider the addition of lenalidomide to treatment (R2-CHOP) 10. Return in 2-3 weeks for follow-up    THERAPY PLAN:  Due to progression of disease, a change in therapy is warranted.  Pending MUGA scan results, I have offered the patient R-CHOP.  The additional of lenalidomide may be considered based upon germinal center status: "Lenalidomide plus R-CHOP21 in elderly patients with untreated diffuse large B-cell lymphoma: results of the REAL07 open label, multicentre, phase  2 trial."  All questions were answered. The patient knows to call the clinic with any problems, questions or concerns. We can certainly see the patient much sooner if necessary.  Patient and plan discussed with Dr. Farrel Gobble and he is in agreement with the aforementioned.   KEFALAS,THOMAS 12/20/2013

## 2013-12-19 ENCOUNTER — Ambulatory Visit (HOSPITAL_COMMUNITY)
Admission: RE | Admit: 2013-12-19 | Discharge: 2013-12-19 | Disposition: A | Discharge status: Home / Self Care | Payer: Medicare Other | Source: Ambulatory Visit | Attending: Oncology | Admitting: Oncology

## 2013-12-19 ENCOUNTER — Inpatient Hospital Stay (HOSPITAL_COMMUNITY): Payer: Medicare Other

## 2013-12-19 ENCOUNTER — Ambulatory Visit (HOSPITAL_COMMUNITY): Payer: Medicare Other | Admitting: Oncology

## 2013-12-19 DIAGNOSIS — K760 Fatty (change of) liver, not elsewhere classified: Secondary | ICD-10-CM | POA: Diagnosis not present

## 2013-12-19 DIAGNOSIS — Z79899 Other long term (current) drug therapy: Secondary | ICD-10-CM | POA: Diagnosis not present

## 2013-12-19 DIAGNOSIS — R911 Solitary pulmonary nodule: Secondary | ICD-10-CM | POA: Diagnosis not present

## 2013-12-19 DIAGNOSIS — C8330 Diffuse large B-cell lymphoma, unspecified site: Secondary | ICD-10-CM | POA: Diagnosis not present

## 2013-12-19 DIAGNOSIS — C8580 Other specified types of non-Hodgkin lymphoma, unspecified site: Secondary | ICD-10-CM

## 2013-12-19 DIAGNOSIS — C8510 Unspecified B-cell lymphoma, unspecified site: Secondary | ICD-10-CM | POA: Insufficient documentation

## 2013-12-19 LAB — GLUCOSE, CAPILLARY: GLUCOSE-CAPILLARY: 104 mg/dL — AB (ref 70–99)

## 2013-12-19 MED ORDER — FLUDEOXYGLUCOSE F - 18 (FDG) INJECTION
6.3000 | Freq: Once | INTRAVENOUS | Status: AC | PRN
  Administered 2013-12-19: 6.3 via INTRAVENOUS

## 2013-12-20 ENCOUNTER — Telehealth (HOSPITAL_COMMUNITY): Payer: Self-pay | Admitting: Oncology

## 2013-12-20 ENCOUNTER — Encounter (HOSPITAL_BASED_OUTPATIENT_CLINIC_OR_DEPARTMENT_OTHER): Payer: Medicare Other | Admitting: Oncology

## 2013-12-20 ENCOUNTER — Encounter (HOSPITAL_COMMUNITY): Payer: Self-pay | Admitting: Oncology

## 2013-12-20 ENCOUNTER — Inpatient Hospital Stay (HOSPITAL_COMMUNITY): Payer: Medicare Other

## 2013-12-20 VITALS — BP 139/62 | HR 59 | Temp 97.6°F | Resp 16 | Wt 120.4 lb

## 2013-12-20 DIAGNOSIS — I482 Chronic atrial fibrillation, unspecified: Secondary | ICD-10-CM

## 2013-12-20 DIAGNOSIS — C8330 Diffuse large B-cell lymphoma, unspecified site: Secondary | ICD-10-CM

## 2013-12-20 DIAGNOSIS — C8580 Other specified types of non-Hodgkin lymphoma, unspecified site: Secondary | ICD-10-CM

## 2013-12-20 NOTE — Patient Instructions (Signed)
[facility] Discharge Instructions  RECOMMENDATIONS MADE BY THE CONSULTANT AND ANY TEST RESULTS WILL BE SENT TO YOUR REFERRING PHYSICIAN.  Your cancer is not better after two cycles of chemotherapy.  It has not spread, but it has grown in size.  As a result of this, we need to change chemotherapy regimen.  Before we start, we need to perform a heart test called a MUGA to make sure your heart is pumping efficiently. After we have those results, we will offer chemotherapy which will likely be R-CHOP.  This information is handwritten on the back of your PET scan report.  We will see you back in about 1 week after your MUGA test.  Thank you for choosing [facility] provide your oncology and hematology care.  To afford each patient quality time with our providers, please arrive at least 15 minutes before your scheduled appointment time.  With your help, our goal is to use those 15 minutes to complete the necessary work-up to ensure our physicians have the information they need to help with your evaluation and healthcare recommendations.    Effective January 1st, 2014, we ask that you re-schedule your appointment with our physicians should you arrive 10 or more minutes late for your appointment.  We strive to give you quality time with our providers, and arriving late affects you and other patients whose appointments are after yours.    Again, thank you for choosing [facility]  Our hope is that these requests will decrease the amount of time that you wait before being seen by our physicians.       _____________________________________________________________  Should you have questions after your visit to [facility], please contact our office at [phone] between the hours of 8:30 a.m. and 5:00 p.m.  Voicemails left after 4:30 p.m. will not be returned until the following business day.  For prescription refill requests, have your pharmacy  contact our office with your prescription refill request.

## 2013-12-20 NOTE — Telephone Encounter (Signed)
Dr. Gari Crown called as I had inquired yesterday evening about the patient's germinal center status with regards to her lymphoma.  He called reporting the patient is CD10 negative and therefore very unlikely to have a germinal center non-Hodgkin's lymphoma. 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 With this information, the patient may benefit from the addition of lenalidomide to her R CHOP regimen. I think it would behoove [place] to treat her with R CHOP alone for cycle 1 and evaluate tolerability and then pursue the addition of Revlimid if tolerated well.  Patient and plan discussed with Dr. Farrel Gobble and he is in agreement with the aforementioned.   KEFALAS,THOMAS

## 2013-12-21 ENCOUNTER — Telehealth (HOSPITAL_COMMUNITY): Payer: Self-pay | Admitting: Oncology

## 2013-12-21 ENCOUNTER — Ambulatory Visit (HOSPITAL_COMMUNITY): Payer: Medicare Other

## 2013-12-21 NOTE — Telephone Encounter (Signed)
No answer.  Message left for return phone call.  Jacqueline Spofford 12/21/2013 830 hours

## 2013-12-26 ENCOUNTER — Other Ambulatory Visit (HOSPITAL_COMMUNITY): Payer: Self-pay | Admitting: Oncology

## 2013-12-26 ENCOUNTER — Ambulatory Visit (HOSPITAL_COMMUNITY)
Admission: RE | Admit: 2013-12-26 | Discharge: 2013-12-26 | Disposition: A | Discharge status: Home / Self Care | Payer: Medicare Other | Source: Ambulatory Visit | Attending: Oncology | Admitting: Oncology

## 2013-12-26 ENCOUNTER — Ambulatory Visit (HOSPITAL_COMMUNITY): Payer: Medicare Other | Admitting: Oncology

## 2013-12-26 ENCOUNTER — Other Ambulatory Visit (HOSPITAL_COMMUNITY): Payer: Self-pay | Admitting: *Deleted

## 2013-12-26 ENCOUNTER — Inpatient Hospital Stay (HOSPITAL_COMMUNITY): Payer: Medicare Other

## 2013-12-26 ENCOUNTER — Encounter (HOSPITAL_COMMUNITY): Payer: Self-pay

## 2013-12-26 ENCOUNTER — Ambulatory Visit (INDEPENDENT_AMBULATORY_CARE_PROVIDER_SITE_OTHER): Payer: Medicare Other | Admitting: *Deleted

## 2013-12-26 DIAGNOSIS — I482 Chronic atrial fibrillation, unspecified: Secondary | ICD-10-CM

## 2013-12-26 DIAGNOSIS — C8590 Non-Hodgkin lymphoma, unspecified, unspecified site: Secondary | ICD-10-CM | POA: Insufficient documentation

## 2013-12-26 DIAGNOSIS — I10 Essential (primary) hypertension: Secondary | ICD-10-CM | POA: Insufficient documentation

## 2013-12-26 DIAGNOSIS — Z01818 Encounter for other preprocedural examination: Secondary | ICD-10-CM | POA: Diagnosis not present

## 2013-12-26 DIAGNOSIS — C8580 Other specified types of non-Hodgkin lymphoma, unspecified site: Secondary | ICD-10-CM

## 2013-12-26 DIAGNOSIS — Z7901 Long term (current) use of anticoagulants: Secondary | ICD-10-CM | POA: Insufficient documentation

## 2013-12-26 DIAGNOSIS — Z5181 Encounter for therapeutic drug level monitoring: Secondary | ICD-10-CM | POA: Diagnosis not present

## 2013-12-26 DIAGNOSIS — I4891 Unspecified atrial fibrillation: Secondary | ICD-10-CM

## 2013-12-26 DIAGNOSIS — Z08 Encounter for follow-up examination after completed treatment for malignant neoplasm: Secondary | ICD-10-CM | POA: Insufficient documentation

## 2013-12-26 DIAGNOSIS — Z5111 Encounter for antineoplastic chemotherapy: Secondary | ICD-10-CM | POA: Diagnosis not present

## 2013-12-26 DIAGNOSIS — J449 Chronic obstructive pulmonary disease, unspecified: Secondary | ICD-10-CM | POA: Insufficient documentation

## 2013-12-26 DIAGNOSIS — Z9221 Personal history of antineoplastic chemotherapy: Secondary | ICD-10-CM | POA: Insufficient documentation

## 2013-12-26 DIAGNOSIS — Z79899 Other long term (current) drug therapy: Secondary | ICD-10-CM | POA: Insufficient documentation

## 2013-12-26 DIAGNOSIS — Z0189 Encounter for other specified special examinations: Secondary | ICD-10-CM | POA: Diagnosis not present

## 2013-12-26 HISTORY — DX: Malignant (primary) neoplasm, unspecified: C80.1

## 2013-12-26 LAB — POCT INR: INR: 2

## 2013-12-26 MED ORDER — TECHNETIUM TC 99M-LABELED RED BLOOD CELLS IV KIT
25.0000 | PACK | Freq: Once | INTRAVENOUS | Status: AC | PRN
  Administered 2013-12-26: 26 via INTRAVENOUS

## 2013-12-26 MED ORDER — PREDNISONE 20 MG PO TABS: ORAL_TABLET | ORAL | Status: DC

## 2013-12-26 MED ORDER — ALLOPURINOL 300 MG PO TABS: 300.0000 mg | ORAL_TABLET | Freq: Every day | ORAL | Status: DC

## 2013-12-26 MED ORDER — SODIUM CHLORIDE 0.9 % IJ SOLN
INTRAMUSCULAR | Status: AC
  Filled 2013-12-26: qty 12

## 2013-12-26 MED ORDER — HEPARIN SOD (PORK) LOCK FLUSH 100 UNIT/ML IV SOLN
INTRAVENOUS | Status: AC
  Filled 2013-12-26: qty 5

## 2013-12-26 NOTE — Addendum Note (Signed)
Addended by: Gerhard Perches on: 12/26/2013 03:38 PM   Modules accepted: Orders

## 2013-12-27 ENCOUNTER — Inpatient Hospital Stay (HOSPITAL_COMMUNITY): Payer: Medicare Other

## 2013-12-28 ENCOUNTER — Telehealth: Payer: Self-pay | Admitting: *Deleted

## 2013-12-28 ENCOUNTER — Ambulatory Visit (HOSPITAL_COMMUNITY): Payer: Medicare Other

## 2013-12-28 NOTE — Telephone Encounter (Signed)
-----   Message from Lacie Draft, RN sent at 12/27/2013 12:11 PM EST ----- Patient starting RCHOP (Rituxan, Cytoxan, Adriamycin, Vincristin, Prednisone) chemo on 01/02/14  Patient is going to be on Prednisone 60mg  for 5 days in a row every 21 days. She is scheduled for 6 cycles of this chemo which will equal about 5 months of therapy. So Prednisone for 5 months. Just wanted to let you know with patient being on coumadin.  Also, patient scheduled to see you on 12/14 and [place] on 12/15. Do you want [place] to do a blood draw on the 15th and save her a visit or would you prefer to keep it as scheduled to ensure accurate results?

## 2013-12-28 NOTE — Patient Instructions (Addendum)
[place]   CHEMOTHERAPY INSTRUCTIONS  Premeds: Tylenol and Claritin prior to Rituxan. Zofran and Dexamethasone prior to other medications. Zofran is for nausea control/prevention/reductions. Dexamethasone is for nausea prevention/reduction prior to other chemotherapy medications. Side effect of steroids such as Dexamethasone - increase in energy, trouble sleeping, anxiousness, irritability, feeling hot/flushed or looking red/pink in the face/neck/chest areas.  Your chemotherapy regimen is called [facility].  Rituxan - Prior to receiving Rituxan we will give you Tylenol 650mg  and Benadryl 50mg  by mouth. This reduces your risk of having an allergic reaction to the Rituxan. You will do this each time prior to Rituxan. Side Effects: during infusion - itching, low blood pressure, low oxygen, bronchospasm, rash, trouble breathing - we need to know immediately if any of this happens. The first time you receive this drug, it takes a long time to infuse because we titrate the drug very slowly. With each Rituxan infusion, the likelihood of developing an infusion reaction decreases. You may also experience fever, chills, shaking chills, headaches, muscle aches, nausea, rash, and a low white blood cell count. We need to be sure that you are drinking plenty of fluids - preferably 64oz of decaff fluids/water daily. It is best to start drinking fluids 2 days prior to treatment and for up to 4-5 days after treatment.    Adriamycin - bone marrow suppression, nausea, vomiting, hair loss, mouth sores, cardiotoxicity (this is why we do the 2D Echoes), sensitivity to light, will turn urine red for a few voids after receiving it. After voiding red/pink a few times, your urine should begin to go back to a normal yellow color.  Cytoxan is a chemotherapy drug. Side Effects: Nausea/Vomiting/Hair Loss. It can also cause hemorrhagic cystitis (bloody urine) because the chemo irritates your bladder.  You need to push fluids when on this chemo, preferably water(64 oz a day). Do not hold your urine. Urinate before you go to bed and if you wake up during the night go to the bathroom.  Vincristine - peripheral neuropathy - numbness/tingling/burning in hands/fingers/feet/toes. Let us know if this develops. Hair loss, constipation, jaw pain  Prednisone 20mg  tablet. Take 3 tablets (60mg  total) daily on Days 1-5 of chemotherapy. Repeat this with each cycle of chemo. Refer to your calendar. Prednisone in high doses has anti-tumor effects and helps to make the chemo work better.  You will return approximately 48 hours after chemo for your Neulasta injection. This injection is to boost WBC cell production. Major side effect: bone pain/muscle pain   You can expect to be here for approximately 5-6 hours (Maybe longer depending on lab processing time, pharmacy mixing time, etc)     POTENTIAL SIDE EFFECTS OF TREATMENT: Increased Susceptibility to Infection/Bone Marrow Suppression, Nausea/Vomiting, Constipation/Diarrhea/Abdominal Cramping/Hiccups/Heartburn, Hair Thinning/Hair Loss, Changes in Character of Skin and Nails (brittleness, dryness,etc.), Pigment Changes (darkening of nail beds, palms of hands, soles of feet, etc.), Abdominal Cramping, Urinary Frequency, Blood in Urine    EDUCATIONAL MATERIALS GIVEN AND REVIEWED: Specific Instructions Sheets: Rituxan, Adriamycin, Cytoxan, Vincristine, Prednisone, Allopurinol   SELF CARE ACTIVITIES WHILE ON CHEMOTHERAPY: Increase your fluid intake 48 hours prior to treatment and drink at least 2 quarts (64 oz) per day after treatment., No alcohol intake., No aspirin or other medications unless approved by your oncologist., Eat foods that are light and easy to digest., Eat foods at cold or room temperature., No fried, fatty, or spicy foods immediately before or after treatment., Have teeth cleaned professionally before starting treatment.  Keep dentures and partial  plates clean., Use soft toothbrush and do not use mouthwashes that contain alcohol. Biotene is a good mouthwash that is available at most pharmacies or may be ordered by calling [phone]. and Use warm salt water gargles (1 teaspoon salt per 1 quart warm water) before and after meals and at bedtime. Or you may rinse with 2 tablespoons of three -percent hydrogen peroxide mixed in eight ounces of water.   Please wash your hands for at least 30 seconds using warm soapy water. Handwashing is the #1 way to prevent the spread of germs. Stay away from sick people or people who are getting over a cold. If you develop respiratory systems such as green/yellow mucus production or productive cough or persistent cough let us know and we will see if you need an antibiotic. It is a good idea to keep a pair of gloves on when going into grocery stores/[employer] to decrease your risk of coming into contact with germs on the carts, etc. Carry alcohol hand gel with you at all times and use it frequently if out in public. All foods need to be cooked thoroughly. No raw foods. No medium or undercooked meats, eggs. If your food is cooked medium well, it does not need to be hot pink or saturated with bloody liquid at all. Vegetables and fruits need to be washed/rinsed under the faucet with a dish detergent before being consumed. You can eat raw fruits and vegetables unless we tell you otherwise but it would be best if you cooked them or bought frozen. Do not eat off of salad bars or hot bars unless you really trust the cleanliness of the restaurant. If you need dental work, please let us know before you go for your appointment so that we can coordinate the best possible time for you in regards to your chemo regimen. You need to also let your dentist know that you are actively taking chemo. We may need to do labs prior to your dental appointment. We also want your bowels moving at least every other day. If this is not happening, we  need to know so that we can get you on a bowel regimen to help you go.     MEDICATIONS: You have been given prescriptions for the following medications:  Prednisone 20mg  tablet. Take 3 tablets (60mg  total) daily on Days 1-5 of chemotherapy. Side effects: increase in energy, trouble sleeping, anxiousness, irritability, feeling hot/flushed or looking red/pink in the face/neck/chest areas.   Allopurinol 300mg  tablet. Take 1 tablet daily. When this runs out get it refilled and take it until all the refills run out. This medication works to help ride the body of uric acid. When cancer cells break down they leave behind uric acid. This medication will help rid the body or uric acid.  Continue to take Zofran as needed for nausea/vomiting. Refer to calendar. Zofran can be constipating.   Continue to take Compazine as needed for nausea/vomiting.   Over-the-Counter Meds:  Colace - this is a stool softener. Take 100mg  capsule 2-6 times a day as needed. If you have to take more than 6 capsules of Colace a day call the [facility].  Senna - this is a mild laxative used to treat mild constipation. May take 2 tabs by mouth daily or up to twice a day as needed for mild constipation.  Milk of Magnesia - this is a laxative used to treat moderate to severe constipation. May take 2-4 tablespoons every  8 hours as needed. May increase to 8 tablespoons x 1 dose and if no bowel movement call the [facility].  Imodium - this is for diarrhea. Take 2 tabs after 1st loose stool and then 1 tab every 2 hours until you go a total of 12 hours without a loose stool. Call [facility] if loose stools continue.   SYMPTOMS TO REPORT AS SOON AS POSSIBLE AFTER TREATMENT:  FEVER GREATER THAN 100.5 F  CHILLS WITH OR WITHOUT FEVER  NAUSEA AND VOMITING THAT IS NOT CONTROLLED WITH YOUR NAUSEA MEDICATION  UNUSUAL SHORTNESS OF BREATH  UNUSUAL BRUISING OR BLEEDING  TENDERNESS IN MOUTH AND THROAT WITH OR WITHOUT  PRESENCE OF ULCERS  URINARY PROBLEMS  BOWEL PROBLEMS  UNUSUAL RASH    Wear comfortable clothing and clothing appropriate for easy access to any Portacath or PICC line. Let us know if there is anything that we can do to make your therapy better!      I have been informed and understand all of the instructions given to me and have received a copy. I have been instructed to call the clinic [phone] or my family physician as soon as possible for continued medical care, if indicated. I do not have any more questions at this time but understand that I may call the [facility] or the Patient Navigator at [phone] during office hours should I have questions or need assistance in obtaining follow-up care.            Rituximab injection What is this medicine? RITUXIMAB (ri TUX i mab) is a monoclonal antibody. This medicine changes the way the body's immune system works. It is used commonly to treat non-Hodgkin's lymphoma and other conditions. In cancer cells, this drug targets a specific protein within cancer cells and stops the cancer cells from growing. It is also used to treat rhuematoid arthritis (RA). In RA, this medicine slow the inflammatory process and help reduce joint pain and swelling. This medicine is often used with other cancer or arthritis medications. This medicine may be used for other purposes; ask your health care provider or pharmacist if you have questions. COMMON BRAND NAME(S): Rituxan What should I tell my health care provider before I take this medicine? They need to know if you have any of these conditions: -blood disorders -heart disease -history of hepatitis B -infection (especially a virus infection such as chickenpox, cold sores, or herpes) -irregular heartbeat -kidney disease -lung or breathing disease, like asthma -lupus -an unusual or allergic reaction to rituximab, mouse proteins, other medicines, foods, dyes, or preservatives -pregnant  or trying to get pregnant -breast-feeding How should I use this medicine? This medicine is for infusion into a vein. It is administered in a hospital or clinic by a specially trained health care professional. A special MedGuide will be given to you by the pharmacist with each prescription and refill. Be sure to read this information carefully each time. Talk to your pediatrician regarding the use of this medicine in children. This medicine is not approved for use in children. Overdosage: If you think you have taken too much of this medicine contact a poison control center or emergency room at once. NOTE: This medicine is only for you. Do not share this medicine with others. What if I miss a dose? It is important not to miss a dose. Call your doctor or health care professional if you are unable to keep an appointment. What may interact with this medicine? -cisplatin -medicines for  blood pressure -some other medicines for arthritis -vaccines This list may not describe all possible interactions. Give your health care provider a list of all the medicines, herbs, non-prescription drugs, or dietary supplements you use. Also tell them if you smoke, drink alcohol, or use illegal drugs. Some items may interact with your medicine. What should I watch for while using this medicine? Report any side effects that you notice during your treatment right away, such as changes in your breathing, fever, chills, dizziness or lightheadedness. These effects are more common with the first dose. Visit your prescriber or health care professional for checks on your progress. You will need to have regular blood work. Report any other side effects. The side effects of this medicine can continue after you finish your treatment. Continue your course of treatment even though you feel ill unless your doctor tells you to stop. Call your doctor or health care professional for advice if you get a fever, chills or sore throat, or  other symptoms of a cold or flu. Do not treat yourself. This drug decreases your body's ability to fight infections. Try to avoid being around people who are sick. This medicine may increase your risk to bruise or bleed. Call your doctor or health care professional if you notice any unusual bleeding. Be careful brushing and flossing your teeth or using a toothpick because you may get an infection or bleed more easily. If you have any dental work done, tell your dentist you are receiving this medicine. Avoid taking products that contain aspirin, acetaminophen, ibuprofen, naproxen, or ketoprofen unless instructed by your doctor. These medicines may hide a fever. Do not become pregnant while taking this medicine. Women should inform their doctor if they wish to become pregnant or think they might be pregnant. There is a potential for serious side effects to an unborn child. Talk to your health care professional or pharmacist for more information. Do not breast-feed an infant while taking this medicine. What side effects may I notice from receiving this medicine? Side effects that you should report to your doctor or health care professional as soon as possible: -allergic reactions like skin rash, itching or hives, swelling of the face, lips, or tongue -low blood counts - this medicine may decrease the number of white blood cells, red blood cells and platelets. You may be at increased risk for infections and bleeding. -signs of infection - fever or chills, cough, sore throat, pain or difficulty passing urine -signs of decreased platelets or bleeding - bruising, pinpoint red spots on the skin, black, tarry stools, blood in the urine -signs of decreased red blood cells - unusually weak or tired, fainting spells, lightheadedness -breathing problems -confused, not responsive -chest pain -fast, irregular heartbeat -feeling faint or lightheaded, falls -mouth sores -redness, blistering, peeling or loosening of  the skin, including inside the mouth -stomach pain -swelling of the ankles, feet, or hands -trouble passing urine or change in the amount of urine Side effects that usually do not require medical attention (report to your doctor or other health care professional if they continue or are bothersome): -anxiety -headache -loss of appetite -muscle aches -nausea -night sweats This list may not describe all possible side effects. Call your doctor for medical advice about side effects. You may report side effects to FDA at 1-800-FDA-1088. Where should I keep my medicine? This drug is given in a hospital or clinic and will not be stored at home. NOTE: This sheet is a summary. It may not cover  all possible information. If you have questions about this medicine, talk to your doctor, pharmacist, or health care provider.  2015, Elsevier/Gold Standard. (2007-09-13 14:04:59) Doxorubicin injection What is this medicine? DOXORUBICIN (dox oh ROO bi sin) is a chemotherapy drug. It is used to treat many kinds of cancer like Hodgkin's disease, leukemia, non-Hodgkin's lymphoma, neuroblastoma, sarcoma, and Wilms' tumor. It is also used to treat bladder cancer, breast cancer, lung cancer, ovarian cancer, stomach cancer, and thyroid cancer. This medicine may be used for other purposes; ask your health care provider or pharmacist if you have questions. COMMON BRAND NAME(S): Adriamycin, Adriamycin PFS, Adriamycin RDF, Rubex What should I tell my health care provider before I take this medicine? They need to know if you have any of these conditions: -blood disorders -heart disease, recent heart attack -infection (especially a virus infection such as chickenpox, cold sores, or herpes) -irregular heartbeat -liver disease -recent or ongoing radiation therapy -an unusual or allergic reaction to doxorubicin, other chemotherapy agents, other medicines, foods, dyes, or preservatives -pregnant or trying to get  pregnant -breast-feeding How should I use this medicine? This drug is given as an infusion into a vein. It is administered in a hospital or clinic by a specially trained health care professional. If you have pain, swelling, burning or any unusual feeling around the site of your injection, tell your health care professional right away. Talk to your pediatrician regarding the use of this medicine in children. Special care may be needed. Overdosage: If you think you have taken too much of this medicine contact a poison control center or emergency room at once. NOTE: This medicine is only for you. Do not share this medicine with others. What if I miss a dose? It is important not to miss your dose. Call your doctor or health care professional if you are unable to keep an appointment. What may interact with this medicine? Do not take this medicine with any of the following medications: -cisapride -droperidol -halofantrine -pimozide -zidovudine This medicine may also interact with the following medications: -chloroquine -chlorpromazine -clarithromycin -cyclophosphamide -cyclosporine -erythromycin -medicines for depression, anxiety, or psychotic disturbances -medicines for irregular heart beat like amiodarone, bepridil, dofetilide, encainide, flecainide, propafenone, quinidine -medicines for seizures like ethotoin, fosphenytoin, phenytoin -medicines for nausea, vomiting like dolasetron, ondansetron, palonosetron -medicines to increase blood counts like filgrastim, pegfilgrastim, sargramostim -methadone -methotrexate -pentamidine -progesterone -vaccines -verapamil Talk to your doctor or health care professional before taking any of these medicines: -acetaminophen -aspirin -ibuprofen -ketoprofen -naproxen This list may not describe all possible interactions. Give your health care provider a list of all the medicines, herbs, non-prescription drugs, or dietary supplements you use. Also  tell them if you smoke, drink alcohol, or use illegal drugs. Some items may interact with your medicine. What should I watch for while using this medicine? Your condition will be monitored carefully while you are receiving this medicine. You will need important blood work done while you are taking this medicine. This drug may make you feel generally unwell. This is not uncommon, as chemotherapy can affect healthy cells as well as cancer cells. Report any side effects. Continue your course of treatment even though you feel ill unless your doctor tells you to stop. Your urine may turn red for a few days after your dose. This is not blood. If your urine is dark or brown, call your doctor. In some cases, you may be given additional medicines to help with side effects. Follow all directions for their use. Call your doctor  or health care professional for advice if you get a fever, chills or sore throat, or other symptoms of a cold or flu. Do not treat yourself. This drug decreases your body's ability to fight infections. Try to avoid being around people who are sick. This medicine may increase your risk to bruise or bleed. Call your doctor or health care professional if you notice any unusual bleeding. Be careful brushing and flossing your teeth or using a toothpick because you may get an infection or bleed more easily. If you have any dental work done, tell your dentist you are receiving this medicine. Avoid taking products that contain aspirin, acetaminophen, ibuprofen, naproxen, or ketoprofen unless instructed by your doctor. These medicines may hide a fever. Men and women of childbearing age should use effective birth control methods while using taking this medicine. Do not become pregnant while taking this medicine. There is a potential for serious side effects to an unborn child. Talk to your health care professional or pharmacist for more information. Do not breast-feed an infant while taking this  medicine. Do not let others touch your urine or other body fluids for 5 days after each treatment with this medicine. Caregivers should wear latex gloves to avoid touching body fluids during this time. There is a maximum amount of this medicine you should receive throughout your life. The amount depends on the medical condition being treated and your overall health. Your doctor will watch how much of this medicine you receive in your lifetime. Tell your doctor if you have taken this medicine before. What side effects may I notice from receiving this medicine? Side effects that you should report to your doctor or health care professional as soon as possible: -allergic reactions like skin rash, itching or hives, swelling of the face, lips, or tongue -low blood counts - this medicine may decrease the number of white blood cells, red blood cells and platelets. You may be at increased risk for infections and bleeding. -signs of infection - fever or chills, cough, sore throat, pain or difficulty passing urine -signs of decreased platelets or bleeding - bruising, pinpoint red spots on the skin, black, tarry stools, blood in the urine -signs of decreased red blood cells - unusually weak or tired, fainting spells, lightheadedness -breathing problems -chest pain -fast, irregular heartbeat -mouth sores -nausea, vomiting -pain, swelling, redness at site where injected -pain, tingling, numbness in the hands or feet -swelling of ankles, feet, or hands -unusual bleeding or bruising Side effects that usually do not require medical attention (report to your doctor or health care professional if they continue or are bothersome): -diarrhea -facial flushing -hair loss -loss of appetite -missed menstrual periods -nail discoloration or damage -red or watery eyes -red colored urine -stomach upset This list may not describe all possible side effects. Call your doctor for medical advice about side effects. You  may report side effects to FDA at 1-800-FDA-1088. Where should I keep my medicine? This drug is given in a hospital or clinic and will not be stored at home. NOTE: This sheet is a summary. It may not cover all possible information. If you have questions about this medicine, talk to your doctor, pharmacist, or health care provider.  2015, [employer]vier/Gold Standard. (2012-05-11 09:54:34) Cyclophosphamide injection What is this medicine? CYCLOPHOSPHAMIDE (sye kloe FOSS fa mide) is a chemotherapy drug. It slows the growth of cancer cells. This medicine is used to treat many types of cancer like lymphoma, myeloma, leukemia, breast cancer, and ovarian cancer, to  name a few. This medicine may be used for other purposes; ask your health care provider or pharmacist if you have questions. COMMON BRAND NAME(S): Cytoxan, Neosar What should I tell my health care provider before I take this medicine? They need to know if you have any of these conditions: -blood disorders -history of other chemotherapy -infection -kidney disease -liver disease -recent or ongoing radiation therapy -tumors in the bone marrow -an unusual or allergic reaction to cyclophosphamide, other chemotherapy, other medicines, foods, dyes, or preservatives -pregnant or trying to get pregnant -breast-feeding How should I use this medicine? This drug is usually given as an injection into a vein or muscle or by infusion into a vein. It is administered in a hospital or clinic by a specially trained health care professional. Talk to your pediatrician regarding the use of this medicine in children. Special care may be needed. Overdosage: If you think you have taken too much of this medicine contact a poison control center or emergency room at once. NOTE: This medicine is only for you. Do not share this medicine with others. What if I miss a dose? It is important not to miss your dose. Call your doctor or health care professional if you are  unable to keep an appointment. What may interact with this medicine? This medicine may interact with the following medications: -amiodarone -amphotericin B -azathioprine -certain antiviral medicines for HIV or AIDS such as protease inhibitors (e.g., indinavir, ritonavir) and zidovudine -certain blood pressure medications such as benazepril, captopril, enalapril, fosinopril, lisinopril, moexipril, monopril, perindopril, quinapril, ramipril, trandolapril -certain cancer medications such as anthracyclines (e.g., daunorubicin, doxorubicin), busulfan, cytarabine, paclitaxel, pentostatin, tamoxifen, trastuzumab -certain diuretics such as chlorothiazide, chlorthalidone, hydrochlorothiazide, indapamide, metolazone -certain medicines that treat or prevent blood clots like warfarin -certain muscle relaxants such as succinylcholine -cyclosporine -etanercept -indomethacin -medicines to increase blood counts like filgrastim, pegfilgrastim, sargramostim -medicines used as general anesthesia -metronidazole -natalizumab This list may not describe all possible interactions. Give your health care provider a list of all the medicines, herbs, non-prescription drugs, or dietary supplements you use. Also tell them if you smoke, drink alcohol, or use illegal drugs. Some items may interact with your medicine. What should I watch for while using this medicine? Visit your doctor for checks on your progress. This drug may make you feel generally unwell. This is not uncommon, as chemotherapy can affect healthy cells as well as cancer cells. Report any side effects. Continue your course of treatment even though you feel ill unless your doctor tells you to stop. Drink water or other fluids as directed. Urinate often, even at night. In some cases, you may be given additional medicines to help with side effects. Follow all directions for their use. Call your doctor or health care professional for advice if you get a fever,  chills or sore throat, or other symptoms of a cold or flu. Do not treat yourself. This drug decreases your body's ability to fight infections. Try to avoid being around people who are sick. This medicine may increase your risk to bruise or bleed. Call your doctor or health care professional if you notice any unusual bleeding. Be careful brushing and flossing your teeth or using a toothpick because you may get an infection or bleed more easily. If you have any dental work done, tell your dentist you are receiving this medicine. You may get drowsy or dizzy. Do not drive, use machinery, or do anything that needs mental alertness until you know how this medicine affects you.  Do not become pregnant while taking this medicine or for 1 year after stopping it. Women should inform their doctor if they wish to become pregnant or think they might be pregnant. Men should not father a child while taking this medicine and for 4 months after stopping it. There is a potential for serious side effects to an unborn child. Talk to your health care professional or pharmacist for more information. Do not breast-feed an infant while taking this medicine. This medicine may interfere with the ability to have a child. This medicine has caused ovarian failure in some women. This medicine has caused reduced sperm counts in some men. You should talk with your doctor or health care professional if you are concerned about your fertility. If you are going to have surgery, tell your doctor or health care professional that you have taken this medicine. What side effects may I notice from receiving this medicine? Side effects that you should report to your doctor or health care professional as soon as possible: -allergic reactions like skin rash, itching or hives, swelling of the face, lips, or tongue -low blood counts - this medicine may decrease the number of white blood cells, red blood cells and platelets. You may be at increased risk  for infections and bleeding. -signs of infection - fever or chills, cough, sore throat, pain or difficulty passing urine -signs of decreased platelets or bleeding - bruising, pinpoint red spots on the skin, black, tarry stools, blood in the urine -signs of decreased red blood cells - unusually weak or tired, fainting spells, lightheadedness -breathing problems -dark urine -dizziness -palpitations -swelling of the ankles, feet, hands -trouble passing urine or change in the amount of urine -weight gain -yellowing of the eyes or skin Side effects that usually do not require medical attention (report to your doctor or health care professional if they continue or are bothersome): -changes in nail or skin color -hair loss -missed menstrual periods -mouth sores -nausea, vomiting This list may not describe all possible side effects. Call your doctor for medical advice about side effects. You may report side effects to FDA at 1-800-FDA-1088. Where should I keep my medicine? This drug is given in a hospital or clinic and will not be stored at home. NOTE: This sheet is a summary. It may not cover all possible information. If you have questions about this medicine, talk to your doctor, pharmacist, or health care provider.  2015, [employer]. (2011-11-28 16:22:58) Vincristine injection What is this medicine? VINCRISTINE (vin KRIS teen) is a chemotherapy drug. It slows the growth of cancer cells. This medicine is used to treat many types of cancer like Hodgkin's disease, leukemia, non-Hodgkin's lymphoma, neuroblastoma (brain cancer), rhabdomyosarcoma, and Wilms' tumor. This medicine may be used for other purposes; ask your health care provider or pharmacist if you have questions. COMMON BRAND NAME(S): Oncovin, Vincasar PFS What should I tell my health care provider before I take this medicine? They need to know if you have any of these conditions: -blood disorders -gout -infection  (especially chickenpox, cold sores, or herpes) -kidney disease -liver disease -lung disease -nervous system disease like Charcot-Marie-Tooth (CMT) -recent or ongoing radiation therapy -an unusual or allergic reaction to vincristine, other chemotherapy agents, other medicines, foods, dyes, or preservatives -pregnant or trying to get pregnant -breast-feeding How should I use this medicine? This drug is given as an infusion into a vein. It is administered in a hospital or clinic by a specially trained health care professional. If you have  pain, swelling, burning, or any unusual feeling around the site of your injection, tell your health care professional right away. Talk to your pediatrician regarding the use of this medicine in children. While this drug may be prescribed for selected conditions, precautions do apply. Overdosage: If you think you have taken too much of this medicine contact a poison control center or emergency room at once. NOTE: This medicine is only for you. Do not share this medicine with others. What if I miss a dose? It is important not to miss your dose. Call your doctor or health care professional if you are unable to keep an appointment. What may interact with this medicine? Do not take this medicine with any of the following medications: -itraconazole -mibefradil -voriconazole This medicine may also interact with the following medications: -cyclosporine -erythromycin -fluconazole -ketoconazole -medicines for HIV like delavirdine, efavirenz, nevirapine -medicines for seizures like ethotoin, fosphenotoin, phenytoin -medicines to increase blood counts like filgrastim, pegfilgrastim, sargramostim -other chemotherapy drugs like cisplatin, L-asparaginase, methotrexate, mitomycin, paclitaxel -pegaspargase -vaccines -zalcitabine, ddC Talk to your doctor or health care professional before taking any of these  medicines: -acetaminophen -aspirin -ibuprofen -ketoprofen -naproxen This list may not describe all possible interactions. Give your health care provider a list of all the medicines, herbs, non-prescription drugs, or dietary supplements you use. Also tell them if you smoke, drink alcohol, or use illegal drugs. Some items may interact with your medicine. What should I watch for while using this medicine? Your condition will be monitored carefully while you are receiving this medicine. You will need important blood work done while you are taking this medicine. This drug may make you feel generally unwell. This is not uncommon, as chemotherapy can affect healthy cells as well as cancer cells. Report any side effects. Continue your course of treatment even though you feel ill unless your doctor tells you to stop. In some cases, you may be given additional medicines to help with side effects. Follow all directions for their use. Call your doctor or health care professional for advice if you get a fever, chills or sore throat, or other symptoms of a cold or flu. Do not treat yourself. Avoid taking products that contain aspirin, acetaminophen, ibuprofen, naproxen, or ketoprofen unless instructed by your doctor. These medicines may hide a fever. Do not become pregnant while taking this medicine. Women should inform their doctor if they wish to become pregnant or think they might be pregnant. There is a potential for serious side effects to an unborn child. Talk to your health care professional or pharmacist for more information. Do not breast-feed an infant while taking this medicine. Men may have a lower sperm count while taking this medicine. Talk to your doctor if you plan to father a child. What side effects may I notice from receiving this medicine? Side effects that you should report to your doctor or health care professional as soon as possible: -allergic reactions like skin rash, itching or hives,  swelling of the face, lips, or tongue -breathing problems -confusion or changes in emotions or moods -constipation -cough -mouth sores -muscle weakness -nausea and vomiting -pain, swelling, redness or irritation at the injection site -pain, tingling, numbness in the hands or feet -problems with balance, talking, walking -seizures -stomach pain -trouble passing urine or change in the amount of urine Side effects that usually do not require medical attention (report to your doctor or health care professional if they continue or are bothersome): -diarrhea -hair loss -jaw pain -loss of appetite  This list may not describe all possible side effects. Call your doctor for medical advice about side effects. You may report side effects to FDA at 1-800-FDA-1088. Where should I keep my medicine? This drug is given in a hospital or clinic and will not be stored at home. NOTE: This sheet is a summary. It may not cover all possible information. If you have questions about this medicine, talk to your doctor, pharmacist, or health care provider.  2015, [employer]. (2007-10-11 17:17:13) Prednisone tablets What is this medicine? PREDNISONE (PRED ni sone) is a corticosteroid. It is commonly used to treat inflammation of the skin, joints, lungs, and other organs. Common conditions treated include asthma, allergies, and arthritis. It is also used for other conditions, such as blood disorders and diseases of the adrenal glands. This medicine may be used for other purposes; ask your health care provider or pharmacist if you have questions. COMMON BRAND NAME(S): Deltasone, Predone, Sterapred, Sterapred DS What should I tell my health care provider before I take this medicine? They need to know if you have any of these conditions: -Cushing's syndrome -diabetes -glaucoma -heart disease -high blood pressure -infection (especially a virus infection such as chickenpox, cold sores, or  herpes) -kidney disease -liver disease -mental illness -myasthenia gravis -osteoporosis -seizures -stomach or intestine problems -thyroid disease -an unusual or allergic reaction to lactose, prednisone, other medicines, foods, dyes, or preservatives -pregnant or trying to get pregnant -breast-feeding How should I use this medicine? Take this medicine by mouth with a glass of water. Follow the directions on the prescription label. Take this medicine with food. If you are taking this medicine once a day, take it in the morning. Do not take more medicine than you are told to take. Do not suddenly stop taking your medicine because you may develop a severe reaction. Your doctor will tell you how much medicine to take. If your doctor wants you to stop the medicine, the dose may be slowly lowered over time to avoid any side effects. Talk to your pediatrician regarding the use of this medicine in children. Special care may be needed. Overdosage: If you think you have taken too much of this medicine contact a poison control center or emergency room at once. NOTE: This medicine is only for you. Do not share this medicine with others. What if I miss a dose? If you miss a dose, take it as soon as you can. If it is almost time for your next dose, talk to your doctor or health care professional. You may need to miss a dose or take an extra dose. Do not take double or extra doses without advice. What may interact with this medicine? Do not take this medicine with any of the following medications: -metyrapone -mifepristone This medicine may also interact with the following medications: -aminoglutethimide -amphotericin B -aspirin and aspirin-like medicines -barbiturates -certain medicines for diabetes, like glipizide or glyburide -cholestyramine -cholinesterase inhibitors -cyclosporine -digoxin -diuretics -ephedrine -female hormones, like estrogens and birth control  pills -isoniazid -ketoconazole -NSAIDS, medicines for pain and inflammation, like ibuprofen or naproxen -phenytoin -rifampin -toxoids -vaccines -warfarin This list may not describe all possible interactions. Give your health care provider a list of all the medicines, herbs, non-prescription drugs, or dietary supplements you use. Also tell them if you smoke, drink alcohol, or use illegal drugs. Some items may interact with your medicine. What should I watch for while using this medicine? Visit your doctor or health care professional for regular checks on your progress.  If you are taking this medicine over a prolonged period, carry an identification card with your name and address, the type and dose of your medicine, and your doctor's name and address. This medicine may increase your risk of getting an infection. Tell your doctor or health care professional if you are around anyone with measles or chickenpox, or if you develop sores or blisters that do not heal properly. If you are going to have surgery, tell your doctor or health care professional that you have taken this medicine within the last twelve months. Ask your doctor or health care professional about your diet. You may need to lower the amount of salt you eat. This medicine may affect blood sugar levels. If you have diabetes, check with your doctor or health care professional before you change your diet or the dose of your diabetic medicine. What side effects may I notice from receiving this medicine? Side effects that you should report to your doctor or health care professional as soon as possible: -allergic reactions like skin rash, itching or hives, swelling of the face, lips, or tongue -changes in emotions or moods -changes in vision -depressed mood -eye pain -fever or chills, cough, sore throat, pain or difficulty passing urine -increased thirst -swelling of ankles, feet Side effects that usually do not require medical  attention (report to your doctor or health care professional if they continue or are bothersome): -confusion, excitement, restlessness -headache -nausea, vomiting -skin problems, acne, thin and shiny skin -trouble sleeping -weight gain This list may not describe all possible side effects. Call your doctor for medical advice about side effects. You may report side effects to FDA at 1-800-FDA-1088. Where should I keep my medicine? Keep out of the reach of children. Store at room temperature between 15 and 30 degrees C (59 and 86 degrees F). Protect from light. Keep container tightly closed. Throw away any unused medicine after the expiration date. NOTE: This sheet is a summary. It may not cover all possible information. If you have questions about this medicine, talk to your doctor, pharmacist, or health care provider.  2015, [employer] Standard. (2010-08-29 10:57:14) Allopurinol tablets What is this medicine? ALLOPURINOL (al oh PURE i nole) reduces the amount of uric acid the body makes. It is used to treat the symptoms of gout. It is also used to treat or prevent high uric acid levels that occur as a result of certain types of chemotherapy. This medicine may also help patients who frequently have kidney stones. This medicine may be used for other purposes; ask your health care provider or pharmacist if you have questions. COMMON BRAND NAME(S): Zyloprim What should I tell my health care provider before I take this medicine? They need to know if you have any of these conditions: -kidney or liver disease -an unusual or allergic reaction to allopurinol, other medicines, foods, dyes, or preservatives -pregnant or trying to get pregnant -breast feeding How should I use this medicine? Take this medicine by mouth with a glass of water. Follow the directions on the prescription label. If this medicine upsets your stomach, take it with food or milk. Take your doses at regular intervals. Do not  take your medicine more often than directed. Talk to your pediatrician regarding the use of this medicine in children. Special care may be needed. While this drug may be prescribed for children as young as 6 years for selected conditions, precautions do apply. Overdosage: If you think you have taken too much of this medicine contact  a poison control center or emergency room at once. NOTE: This medicine is only for you. Do not share this medicine with others. What if I miss a dose? If you miss a dose, take it as soon as you can. If it is almost time for your next dose, take only that dose. Do not take double or extra doses. What may interact with this medicine? Do not take this medicine with the following medication: -didanosine, ddI This medicine may also interact with the following medications: -amoxicillin or ampicillin -azathioprine -certain medicines used to treat gout -certain types of diuretics -chlorpropamide -cyclosporine -dicumarol -mercaptopurine -tolbutamide -warfarin This list may not describe all possible interactions. Give your health care provider a list of all the medicines, herbs, non-prescription drugs, or dietary supplements you use. Also tell them if you smoke, drink alcohol, or use illegal drugs. Some items may interact with your medicine. What should I watch for while using this medicine? Visit your doctor or health care professional for regular checks on your progress. If you are taking this medicine to treat gout, you may not have less frequent attacks at first. Keep taking your medicine regularly and the attacks should get better within 2 to 6 weeks. Drink plenty of water (10 to 12 full glasses a day) while you are taking this medicine. This will help to reduce stomach upset and reduce the risk of getting gout or kidney stones. Call your doctor or health care professional at once if you get a skin rash together with chills, fever, sore throat, or nausea and vomiting,  if you have blood in your urine, or difficulty passing urine. Do not take vitamin C without asking your doctor or health care professional. Too much vitamin C can increase the chance of getting kidney stones. You may get drowsy or dizzy. Do not drive, use machinery, or do anything that needs mental alertness until you know how this drug affects you. Do not stand or sit up quickly, especially if you are an older patient. This reduces the risk of dizzy or fainting spells. Alcohol can make you more drowsy and dizzy. Alcohol can also increase the chance of stomach problems and increase the amount of uric acid in your blood. Avoid alcoholic drinks. What side effects may I notice from receiving this medicine? Side effects that you should report to your doctor or health care professional as soon as possible: -allergic reactions like skin rash, itching or hives, swelling of the face, lips, or tongue -breathing problems -muscle aches or pains -redness, blistering, peeling or loosening of the skin, including inside the mouth Side effects that usually do not require medical attention (report to your doctor or health care professional if they continue or are bothersome): -changes in taste -diarrhea -indigestion -stomach pain or cramps This list may not describe all possible side effects. Call your doctor for medical advice about side effects. You may report side effects to FDA at 1-800-FDA-1088. Where should I keep my medicine? Keep out of the reach of children. Store at room temperature between 15 and 25 degrees C (59 and 77 degrees F). Protect from light and moisture. Throw away any unused medicine after the expiration date. NOTE: This sheet is a summary. It may not cover all possible information. If you have questions about this medicine, talk to your doctor, pharmacist, or health care provider.  2015, [employer]. (2007-07-19 14:26:54)

## 2014-01-01 NOTE — Progress Notes (Signed)
  [name],[name], MD [address] [place] [address]  Lymphoma malignant, large cell  CURRENT THERAPY: To start R-CHOP today for progression of disease after receiving 2 cycles of Bendamustine/Rituxan.  Will try to get Lenalidomide 15 mg days 1-14 every 21 days approved through insurance.  INTERVAL HISTORY: Marissa Turner 73 y.o. 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     Lymphoma malignant, large cell   09/15/2013 Initial Diagnosis Diagnosis Lymph node, biopsy, cervical left neck - DIFFUSE LARGE B CELL LYMPHOMA.   10/06/2013 PET scan Hypermetabolic thickening within the right vallecular consists with primary head and neck cancer.  Single enlarged hypermetabolic right level IIa lymph node consistent local nodal metastasis.   10/31/2013 - 11/29/2013 Chemotherapy Bendamustine/Rituxan x 2 cycles   12/19/2013 Progression PET- Progression of presumed vallecular lymphoma as evidenced by increased hypermetabolism.   12/20/2013 Treatment Plan Change Per Dr. Smir, patient has a CD10 negative, non-germinal center non-Hodgkin's lymphoma.  Patient may be a candidate for additional of Revlimid to treatment plan.   12/28/2013 Echocardiogram MUGA- Normal left ventricular wall motion with calculated ejection fraction of 60%.    Chemotherapy R-CHOP   I personally reviewed and went over laboratory results with the patient.  The results are noted within this dictation.  I personally reviewed and went over radiographic studies with the patient.  The results are noted within this dictation.    The patient has a potentially curable disease and with her non-germinal center she has a poor prognostic factor.  Therefore, Lenalidomide added to R-CHOP may be of benefit given the data provided in "Lenalidomide plus R-CHOP21 in elderly patients with untreated diffuse  large B-cell lymphoma: results of the REAL07 open label, multicentre, phase 2 trial" published in Lancet of Oncology in June 2014 and "Lenalidomide in non-Hodgkin's lymphoma: biological perspectives and therapeutic opportunities" published in Blood in April 2015.  It is noted that the addition of Lenalidomide to R-CHOP in refractory DLBCL provides a CR in 86% and PR in 6% of patients with a 2 yr PFS of 80% and 2 year OS of 92%.  Therefore, we are interested in taking advantage of this OS improvement and add Lenalidomide.  I will provide an RX for this medication and see if her insurance will approve this additional medication.  I suspect peer-to-peer will be needed.  The patient underwent chemotherapy teaching today.  All questions were answered.  Past Medical History  Diagnosis Date  . Atrial fibrillation, chronic 2004    left atrial thrombus in 2004; near syncope in 2008; echocardiogram in 2008-normal EF, mild left atrial enlargement, no valvular abnormalities, lipomatous hypertrophy; no aortic atheroma on TEE in 2004  . Chronic anticoagulation 2004    warfarin  . Tobacco abuse     40 pack years; quit attempt in 2012  . DJD (degenerative joint disease), lumbosacral     Also hip  . Peptic ulcer disease     s/p Billroth II  . Depression   . Fracture of wrist     Left  . Hypertension     Normal carotid ultrasound in 2008  . PONV (postoperative nausea and vomiting)   . Dysrhythmia     chronic AFib  . Cancer     has SPINAL STENOSIS; DJD (degenerative joint disease), lumbosacral; Atrial fibrillation, chronic; Chronic anticoagulation; Tobacco abuse; Peptic ulcer disease; Hypertension; Encounter for therapeutic drug monitoring;   Lymphoma malignant, large cell; and Iron deficiency on her problem list.     is allergic to diphenhydramine hcl; estrogens; penicillins; and codeine.  Ms. Spackman does not currently have medications on file.  Past Surgical History  Procedure Laterality Date  .  Cholecystectomy  2004    Miranda II; splenectomy; incidental appendectomy  . Abdominal hysterectomy      w/o oophorectomy  . Cesarean section    . Tonsillectomy    . Colonoscopy  1980s    patient denies ever having undergone colonoscopy as of 2014  . Appendectomy    . Splenectomy, total    . Lymph node biopsy Left 09/15/2013    Procedure: LEFT NECK CERVICAL NODE BIOPSY;  Surgeon: Scherry Ran, MD;  Location: [facility];  Service: General;  Laterality: Left;  . Portacath placement Right 10/21/2013    Procedure: ATTEMPTED INSERTION PORT-A-CATH;  Surgeon: Scherry Ran, MD;  Location: [facility];  Service: General;  Laterality: Right;    Denies any headaches, dizziness, double vision, fevers, chills, night sweats, nausea, vomiting, diarrhea, constipation, chest pain, heart palpitations, shortness of breath, blood in stool, black tarry stool, urinary pain, urinary burning, urinary frequency, hematuria.   PHYSICAL EXAMINATION  ECOG PERFORMANCE STATUS: 1 - Symptomatic but completely ambulatory  There were no vitals filed for this visit.  GENERAL:alert, no distress, well nourished, well developed, comfortable, cooperative, smiling and in chemotherapy bed receiving Rituxan. SKIN: skin color, texture, turgor are normal, no rashes or significant lesions HEAD: Normocephalic, No masses, lesions, tenderness or abnormalities EYES: normal, PERRLA, EOMI, Conjunctiva are pink and non-injected EARS: External ears normal OROPHARYNX:mucous membranes are moist  NECK: supple, trachea midline LYMPH:  not examined BREAST:not examined LUNGS: not examined HEART: not examined ABDOMEN:not examined BACK: Back symmetric, no curvature. EXTREMITIES:less then 2 second capillary refill, no joint deformities, effusion, or inflammation, no edema, no skin discoloration, no cyanosis  NEURO: alert & oriented x 3 with fluent speech, no focal motor/sensory  deficits   LABORATORY DATA: CBC    Component Value Date/Time   WBC 12.5* 01/02/2014 0840   RBC 4.22 01/02/2014 0840   RBC 4.84 10/10/2013 1502   HGB 12.7 01/02/2014 0840   HCT 38.5 01/02/2014 0840   PLT 287 01/02/2014 0840   MCV 91.2 01/02/2014 0840   MCH 30.1 01/02/2014 0840   MCHC 33.0 01/02/2014 0840   RDW 21.4* 01/02/2014 0840   LYMPHSABS 3.4 01/02/2014 0840   MONOABS 0.8 01/02/2014 0840   EOSABS 0.6 01/02/2014 0840   BASOSABS 0.1 01/02/2014 0840      Chemistry      Component Value Date/Time   NA 143 01/02/2014 0840   K 3.5* 01/02/2014 0840   CL 102 01/02/2014 0840   CO2 29 01/02/2014 0840   BUN 9 01/02/2014 0840   CREATININE 0.55 01/02/2014 0840   CREATININE 0.65 06/30/2013 0840      Component Value Date/Time   CALCIUM 8.8 01/02/2014 0840   ALKPHOS 94 01/02/2014 0840   AST 24 01/02/2014 0840   ALT 27 01/02/2014 0840   BILITOT 0.2* 01/02/2014 0840     RADIOLOGY:  12/28/2013  CLINICAL DATA: Malignant lymphoma. Evaluate cardiac function baseline prior to cardiotoxic chemotherapy. Initial encounter.  EXAM: NUCLEAR MEDICINE CARDIAC BLOOD POOL IMAGING (MUGA)  TECHNIQUE: Cardiac multi-gated acquisition was performed at rest following intravenous injection of Tc-73mlabeled red blood cells.  RADIOPHARMACEUTICALS: 26.0 [id number]-vitro labeled red blood cells.  COMPARISON: None.  FINDINGS: The calculated left ventricular ejection fraction is 60%. On review of the cine images, the left ventricular wall motion appears normal.  IMPRESSION: Normal left ventricular wall motion with calculated ejection fraction of 60%.   Electronically Signed  By: Bill Veazey M.D.  On: 12/28/2013 09:41   ASSESSMENT:  1. 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  This is a CD10 negative, non-germinal center, NHL refractory to Bendamustine/Rituxan.     Patient Active Problem List   Diagnosis Date Noted  . Iron deficiency 10/17/2013  . Lymphoma malignant, large cell 10/10/2013  . Encounter for therapeutic drug monitoring 02/24/2013  . DJD (degenerative joint disease), lumbosacral   . Atrial fibrillation, chronic   . Chronic anticoagulation   . Tobacco abuse   . Peptic ulcer disease   . Hypertension   . SPINAL STENOSIS 02/03/2008     PLAN:  1. I personally reviewed and went over laboratory results with the patient.  The results are noted within this dictation. 2. I personally reviewed and went over radiographic studies with the patient.  The results are noted within this dictation.   3. Pre-chemo labs: CBC diff, CMET, LDH, B2M, ESR 4. MUGA in 12 weeks 5. Return in 1 week for follow-up to evaluate tolerability of cycle 1.   THERAPY PLAN:  We will administer 2 cycles of R-CHOP.  In the meantime, we will attempt to get Lenalidomide approved to add to her R-CHOP regimen turning her regimen into R2-CHOP with the addition of Revlimid 15 mg daily on days 1-14 per data provided in: "Lenalidomide plus R-CHOP21 in elderly patients with untreated diffuse large B-cell lymphoma: results of the REAL07 open label, multicentre, phase 2 trial" published in Lancet of Oncology in June 2014 and "Lenalidomide in non-Hodgkin's lymphoma: biological perspectives and therapeutic opportunities" published in Blood in April 2015.  It is noted that the addition of Lenalidomide to R-CHOP in refractory DLBCL provides a CR in 86% and PR in 6% of patients with a 2 yr PFS of 80% and 2 year OS of 92%.    All questions were answered. The patient knows to call the clinic with any problems, questions or concerns. We can certainly see the patient much sooner if necessary.  Patient and plan discussed with Dr. Gregory Formanek and he is in agreement with the aforementioned.   KEFALAS,THOMAS 01/02/2014      

## 2014-01-02 ENCOUNTER — Encounter (HOSPITAL_COMMUNITY): Payer: Self-pay

## 2014-01-02 ENCOUNTER — Encounter (HOSPITAL_COMMUNITY): Payer: Medicare Other

## 2014-01-02 ENCOUNTER — Encounter (HOSPITAL_COMMUNITY): Payer: Medicare Other | Attending: Hematology and Oncology | Admitting: Oncology

## 2014-01-02 ENCOUNTER — Encounter (HOSPITAL_BASED_OUTPATIENT_CLINIC_OR_DEPARTMENT_OTHER): Payer: Medicare Other

## 2014-01-02 DIAGNOSIS — C8519 Unspecified B-cell lymphoma, extranodal and solid organ sites: Secondary | ICD-10-CM | POA: Diagnosis not present

## 2014-01-02 DIAGNOSIS — I10 Essential (primary) hypertension: Secondary | ICD-10-CM | POA: Diagnosis not present

## 2014-01-02 DIAGNOSIS — Z90710 Acquired absence of both cervix and uterus: Secondary | ICD-10-CM | POA: Diagnosis not present

## 2014-01-02 DIAGNOSIS — C8580 Other specified types of non-Hodgkin lymphoma, unspecified site: Secondary | ICD-10-CM

## 2014-01-02 DIAGNOSIS — Z9079 Acquired absence of other genital organ(s): Secondary | ICD-10-CM | POA: Diagnosis not present

## 2014-01-02 DIAGNOSIS — Z9089 Acquired absence of other organs: Secondary | ICD-10-CM | POA: Diagnosis not present

## 2014-01-02 DIAGNOSIS — C8331 Diffuse large B-cell lymphoma, lymph nodes of head, face, and neck: Secondary | ICD-10-CM

## 2014-01-02 DIAGNOSIS — M47817 Spondylosis without myelopathy or radiculopathy, lumbosacral region: Secondary | ICD-10-CM | POA: Insufficient documentation

## 2014-01-02 DIAGNOSIS — J449 Chronic obstructive pulmonary disease, unspecified: Secondary | ICD-10-CM | POA: Diagnosis not present

## 2014-01-02 DIAGNOSIS — Z5112 Encounter for antineoplastic immunotherapy: Secondary | ICD-10-CM

## 2014-01-02 DIAGNOSIS — F329 Major depressive disorder, single episode, unspecified: Secondary | ICD-10-CM | POA: Diagnosis not present

## 2014-01-02 DIAGNOSIS — F17210 Nicotine dependence, cigarettes, uncomplicated: Secondary | ICD-10-CM | POA: Insufficient documentation

## 2014-01-02 DIAGNOSIS — Z7901 Long term (current) use of anticoagulants: Secondary | ICD-10-CM | POA: Insufficient documentation

## 2014-01-02 DIAGNOSIS — Z980 Intestinal bypass and anastomosis status: Secondary | ICD-10-CM | POA: Insufficient documentation

## 2014-01-02 DIAGNOSIS — Z5111 Encounter for antineoplastic chemotherapy: Secondary | ICD-10-CM | POA: Diagnosis not present

## 2014-01-02 DIAGNOSIS — I482 Chronic atrial fibrillation: Secondary | ICD-10-CM | POA: Diagnosis not present

## 2014-01-02 LAB — COMPREHENSIVE METABOLIC PANEL
ALBUMIN: 3.3 g/dL — AB (ref 3.5–5.2)
ALK PHOS: 94 U/L (ref 39–117)
ALT: 27 U/L (ref 0–35)
ANION GAP: 12 (ref 5–15)
AST: 24 U/L (ref 0–37)
BUN: 9 mg/dL (ref 6–23)
CALCIUM: 8.8 mg/dL (ref 8.4–10.5)
CO2: 29 mEq/L (ref 19–32)
Chloride: 102 mEq/L (ref 96–112)
Creatinine, Ser: 0.55 mg/dL (ref 0.50–1.10)
GFR calc Af Amer: >90 mL/min (ref >90)
GFR calc non Af Amer: >90 mL/min (ref >90)
Glucose, Bld: 95 mg/dL (ref 70–99)
POTASSIUM: 3.5 meq/L — AB (ref 3.7–5.3)
Sodium: 143 mEq/L (ref 137–147)
TOTAL PROTEIN: 6.4 g/dL (ref 6.0–8.3)
Total Bilirubin: 0.2 mg/dL — ABNORMAL LOW (ref 0.3–1.2)

## 2014-01-02 LAB — CBC WITH DIFFERENTIAL/PLATELET
BASOS PCT: 1 % (ref 0–1)
Basophils Absolute: 0.1 10*3/uL (ref 0.0–0.1)
EOS ABS: 0.6 10*3/uL (ref 0.0–0.7)
Eosinophils Relative: 5 % (ref 0–5)
HCT: 38.5 % (ref 36.0–46.0)
HEMOGLOBIN: 12.7 g/dL (ref 12.0–15.0)
Lymphocytes Relative: 27 % (ref 12–46)
Lymphs Abs: 3.4 10*3/uL (ref 0.7–4.0)
MCH: 30.1 pg (ref 26.0–34.0)
MCHC: 33 g/dL (ref 30.0–36.0)
MCV: 91.2 fL (ref 78.0–100.0)
Monocytes Absolute: 0.8 10*3/uL (ref 0.1–1.0)
Monocytes Relative: 6 % (ref 3–12)
NEUTROS PCT: 61 % (ref 43–77)
Neutro Abs: 7.7 10*3/uL (ref 1.7–7.7)
PLATELETS: 287 10*3/uL (ref 150–400)
RBC: 4.22 MIL/uL (ref 3.87–5.11)
RDW: 21.4 % — ABNORMAL HIGH (ref 11.5–15.5)
WBC: 12.5 10*3/uL — ABNORMAL HIGH (ref 4.0–10.5)

## 2014-01-02 LAB — SEDIMENTATION RATE: SED RATE: 10 mm/h (ref 0–22)

## 2014-01-02 LAB — LACTATE DEHYDROGENASE: LDH: 190 U/L (ref 94–250)

## 2014-01-02 MED ORDER — SODIUM CHLORIDE 0.9 % IV SOLN
750.0000 mg/m2 | Freq: Once | INTRAVENOUS | Status: AC
  Administered 2014-01-02: 1140 mg via INTRAVENOUS
  Filled 2014-01-02: qty 7

## 2014-01-02 MED ORDER — DEXAMETHASONE SODIUM PHOSPHATE 10 MG/ML IJ SOLN: 20.0000 mg | Freq: Once | INTRAMUSCULAR | Status: DC

## 2014-01-02 MED ORDER — LENALIDOMIDE 15 MG PO CAPS: ORAL_CAPSULE | ORAL | Status: DC

## 2014-01-02 MED ORDER — ACETAMINOPHEN 325 MG PO TABS
650.0000 mg | ORAL_TABLET | Freq: Once | ORAL | Status: AC
  Administered 2014-01-02: 650 mg via ORAL
  Filled 2014-01-02: qty 2

## 2014-01-02 MED ORDER — LORATADINE 10 MG PO TABS
10.0000 mg | ORAL_TABLET | Freq: Every day | ORAL | Status: DC
  Administered 2014-01-02: 10 mg via ORAL
  Filled 2014-01-02: qty 1

## 2014-01-02 MED ORDER — SODIUM CHLORIDE 0.9 % IV SOLN
Freq: Once | INTRAVENOUS | Status: AC
  Administered 2014-01-02: 40 mg via INTRAVENOUS
  Filled 2014-01-02: qty 8

## 2014-01-02 MED ORDER — SODIUM CHLORIDE 0.9 % IV SOLN
Freq: Once | INTRAVENOUS | Status: AC
  Administered 2014-01-02: 09:00:00 via INTRAVENOUS

## 2014-01-02 MED ORDER — SODIUM CHLORIDE 0.9 % IJ SOLN: 10.0000 mL | INTRAMUSCULAR | Status: DC | PRN

## 2014-01-02 MED ORDER — DOXORUBICIN HCL CHEMO IV INJECTION 2 MG/ML
50.0000 mg/m2 | Freq: Once | INTRAVENOUS | Status: AC
  Administered 2014-01-02: 76 mg via INTRAVENOUS
  Filled 2014-01-02: qty 38

## 2014-01-02 MED ORDER — HEPARIN SOD (PORK) LOCK FLUSH 100 UNIT/ML IV SOLN
500.0000 [IU] | Freq: Once | INTRAVENOUS | Status: AC | PRN
  Administered 2014-01-02: 500 [IU]

## 2014-01-02 MED ORDER — SODIUM CHLORIDE 0.9 % IV SOLN: 16.0000 mg | Freq: Once | INTRAVENOUS | Status: DC

## 2014-01-02 MED ORDER — VINCRISTINE SULFATE CHEMO INJECTION 1 MG/ML
2.0000 mg | Freq: Once | INTRAVENOUS | Status: AC
  Administered 2014-01-02: 2 mg via INTRAVENOUS
  Filled 2014-01-02: qty 2

## 2014-01-02 MED ORDER — SODIUM CHLORIDE 0.9 % IV SOLN
375.0000 mg/m2 | Freq: Once | INTRAVENOUS | Status: AC
  Administered 2014-01-02: 600 mg via INTRAVENOUS
  Filled 2014-01-02: qty 10

## 2014-01-02 NOTE — Addendum Note (Signed)
Addended by: Baird Cancer on: 01/02/2014 12:34 PM   Modules accepted: Orders

## 2014-01-02 NOTE — Patient Instructions (Signed)
[facility] Discharge Instructions for Patients Receiving Chemotherapy  Today you received the following chemotherapy agents adriamycin, vincristine, cyclophosphamide, rituxan Come Wednesday for your neulasta injection. Please call the clinic if you have any questions or concerns  To help prevent nausea and vomiting after your treatment, we encourage you to take your nausea medication     If you develop nausea and vomiting that is not controlled by your nausea medication, call the clinic. If it is after clinic hours your family physician or the after hours number for the clinic or go to the Emergency Department.   BELOW ARE SYMPTOMS THAT SHOULD BE REPORTED IMMEDIATELY:  *FEVER GREATER THAN 101.0 F  *CHILLS WITH OR WITHOUT FEVER  NAUSEA AND VOMITING THAT IS NOT CONTROLLED WITH YOUR NAUSEA MEDICATION  *UNUSUAL SHORTNESS OF BREATH  *UNUSUAL BRUISING OR BLEEDING  TENDERNESS IN MOUTH AND THROAT WITH OR WITHOUT PRESENCE OF ULCERS  *URINARY PROBLEMS  *BOWEL PROBLEMS  UNUSUAL RASH Items with * indicate a potential emergency and should be followed up as soon as possible.  One of the nurses will contact you 24 hours after your treatment. Please let the nurse know about any problems that you may have experienced. Feel free to call the clinic you have any questions or concerns. The clinic phone number is [phone].   I have been informed and understand all the instructions given to me. I know to contact the clinic, my physician, or go to the Emergency Department if any problems should occur. I do not have any questions at this time, but understand that I may call the clinic during office hours or the Patient Navigator at [phone] should I have any questions or need assistance in obtaining follow up care.    Doxorubicin injection What is this medicine? DOXORUBICIN (dox oh ROO bi sin) is a chemotherapy drug. It is used to treat many kinds of cancer like Hodgkin's  disease, leukemia, non-Hodgkin's lymphoma, neuroblastoma, sarcoma, and Wilms' tumor. It is also used to treat bladder cancer, breast cancer, lung cancer, ovarian cancer, stomach cancer, and thyroid cancer. This medicine may be used for other purposes; ask your health care provider or pharmacist if you have questions. COMMON BRAND NAME(S): Adriamycin, Adriamycin PFS, Adriamycin RDF, Rubex What should I tell my health care provider before I take this medicine? They need to know if you have any of these conditions: -blood disorders -heart disease, recent heart attack -infection (especially a virus infection such as chickenpox, cold sores, or herpes) -irregular heartbeat -liver disease -recent or ongoing radiation therapy -an unusual or allergic reaction to doxorubicin, other chemotherapy agents, other medicines, foods, dyes, or preservatives -pregnant or trying to get pregnant -breast-feeding How should I use this medicine? This drug is given as an infusion into a vein. It is administered in a hospital or clinic by a specially trained health care professional. If you have pain, swelling, burning or any unusual feeling around the site of your injection, tell your health care professional right away. Talk to your pediatrician regarding the use of this medicine in children. Special care may be needed. Overdosage: If you think you have taken too much of this medicine contact a poison control center or emergency room at once. NOTE: This medicine is only for you. Do not share this medicine with others. What if I miss a dose? It is important not to miss your dose. Call your doctor or health care professional if you are unable to keep an appointment. What may  interact with this medicine? Do not take this medicine with any of the following medications: -cisapride -droperidol -halofantrine -pimozide -zidovudine This medicine may also interact with the following  medications: -chloroquine -chlorpromazine -clarithromycin -cyclophosphamide -cyclosporine -erythromycin -medicines for depression, anxiety, or psychotic disturbances -medicines for irregular heart beat like amiodarone, bepridil, dofetilide, encainide, flecainide, propafenone, quinidine -medicines for seizures like ethotoin, fosphenytoin, phenytoin -medicines for nausea, vomiting like dolasetron, ondansetron, palonosetron -medicines to increase blood counts like filgrastim, pegfilgrastim, sargramostim -methadone -methotrexate -pentamidine -progesterone -vaccines -verapamil Talk to your doctor or health care professional before taking any of these medicines: -acetaminophen -aspirin -ibuprofen -ketoprofen -naproxen This list may not describe all possible interactions. Give your health care provider a list of all the medicines, herbs, non-prescription drugs, or dietary supplements you use. Also tell them if you smoke, drink alcohol, or use illegal drugs. Some items may interact with your medicine. What should I watch for while using this medicine? Your condition will be monitored carefully while you are receiving this medicine. You will need important blood work done while you are taking this medicine. This drug may make you feel generally unwell. This is not uncommon, as chemotherapy can affect healthy cells as well as cancer cells. Report any side effects. Continue your course of treatment even though you feel ill unless your doctor tells you to stop. Your urine may turn red for a few days after your dose. This is not blood. If your urine is dark or brown, call your doctor. In some cases, you may be given additional medicines to help with side effects. Follow all directions for their use. Call your doctor or health care professional for advice if you get a fever, chills or sore throat, or other symptoms of a cold or flu. Do not treat yourself. This drug decreases your body's ability to  fight infections. Try to avoid being around people who are sick. This medicine may increase your risk to bruise or bleed. Call your doctor or health care professional if you notice any unusual bleeding. Be careful brushing and flossing your teeth or using a toothpick because you may get an infection or bleed more easily. If you have any dental work done, tell your dentist you are receiving this medicine. Avoid taking products that contain aspirin, acetaminophen, ibuprofen, naproxen, or ketoprofen unless instructed by your doctor. These medicines may hide a fever. Men and women of childbearing age should use effective birth control methods while using taking this medicine. Do not become pregnant while taking this medicine. There is a potential for serious side effects to an unborn child. Talk to your health care professional or pharmacist for more information. Do not breast-feed an infant while taking this medicine. Do not let others touch your urine or other body fluids for 5 days after each treatment with this medicine. Caregivers should wear latex gloves to avoid touching body fluids during this time. There is a maximum amount of this medicine you should receive throughout your life. The amount depends on the medical condition being treated and your overall health. Your doctor will watch how much of this medicine you receive in your lifetime. Tell your doctor if you have taken this medicine before. What side effects may I notice from receiving this medicine? Side effects that you should report to your doctor or health care professional as soon as possible: -allergic reactions like skin rash, itching or hives, swelling of the face, lips, or tongue -low blood counts - this medicine may decrease the number of white  blood cells, red blood cells and platelets. You may be at increased risk for infections and bleeding. -signs of infection - fever or chills, cough, sore throat, pain or difficulty passing  urine -signs of decreased platelets or bleeding - bruising, pinpoint red spots on the skin, black, tarry stools, blood in the urine -signs of decreased red blood cells - unusually weak or tired, fainting spells, lightheadedness -breathing problems -chest pain -fast, irregular heartbeat -mouth sores -nausea, vomiting -pain, swelling, redness at site where injected -pain, tingling, numbness in the hands or feet -swelling of ankles, feet, or hands -unusual bleeding or bruising Side effects that usually do not require medical attention (report to your doctor or health care professional if they continue or are bothersome): -diarrhea -facial flushing -hair loss -loss of appetite -missed menstrual periods -nail discoloration or damage -red or watery eyes -red colored urine -stomach upset This list may not describe all possible side effects. Call your doctor for medical advice about side effects. You may report side effects to FDA at 1-800-FDA-1088. Where should I keep my medicine? This drug is given in a hospital or clinic and will not be stored at home. NOTE: This sheet is a summary. It may not cover all possible information. If you have questions about this medicine, talk to your doctor, pharmacist, or health care provider.  2015, Elsevier/Gold Standard. (2012-05-11 09:54:34)   Vincristine injection What is this medicine? VINCRISTINE (vin KRIS teen) is a chemotherapy drug. It slows the growth of cancer cells. This medicine is used to treat many types of cancer like Hodgkin's disease, leukemia, non-Hodgkin's lymphoma, neuroblastoma (brain cancer), rhabdomyosarcoma, and Wilms' tumor. This medicine may be used for other purposes; ask your health care provider or pharmacist if you have questions. COMMON BRAND NAME(S): Oncovin, Vincasar PFS What should I tell my health care provider before I take this medicine? They need to know if you have any of these conditions: -blood  disorders -gout -infection (especially chickenpox, cold sores, or herpes) -kidney disease -liver disease -lung disease -nervous system disease like Charcot-Marie-Tooth (CMT) -recent or ongoing radiation therapy -an unusual or allergic reaction to vincristine, other chemotherapy agents, other medicines, foods, dyes, or preservatives -pregnant or trying to get pregnant -breast-feeding How should I use this medicine? This drug is given as an infusion into a vein. It is administered in a hospital or clinic by a specially trained health care professional. If you have pain, swelling, burning, or any unusual feeling around the site of your injection, tell your health care professional right away. Talk to your pediatrician regarding the use of this medicine in children. While this drug may be prescribed for selected conditions, precautions do apply. Overdosage: If you think you have taken too much of this medicine contact a poison control center or emergency room at once. NOTE: This medicine is only for you. Do not share this medicine with others. What if I miss a dose? It is important not to miss your dose. Call your doctor or health care professional if you are unable to keep an appointment. What may interact with this medicine? Do not take this medicine with any of the following medications: -itraconazole -mibefradil -voriconazole This medicine may also interact with the following medications: -cyclosporine -erythromycin -fluconazole -ketoconazole -medicines for HIV like delavirdine, efavirenz, nevirapine -medicines for seizures like ethotoin, fosphenotoin, phenytoin -medicines to increase blood counts like filgrastim, pegfilgrastim, sargramostim -other chemotherapy drugs like cisplatin, L-asparaginase, methotrexate, mitomycin, paclitaxel -pegaspargase -vaccines -zalcitabine, ddC Talk to your doctor or health care  professional before taking any of these  medicines: -acetaminophen -aspirin -ibuprofen -ketoprofen -naproxen This list may not describe all possible interactions. Give your health care provider a list of all the medicines, herbs, non-prescription drugs, or dietary supplements you use. Also tell them if you smoke, drink alcohol, or use illegal drugs. Some items may interact with your medicine. What should I watch for while using this medicine? Your condition will be monitored carefully while you are receiving this medicine. You will need important blood work done while you are taking this medicine. This drug may make you feel generally unwell. This is not uncommon, as chemotherapy can affect healthy cells as well as cancer cells. Report any side effects. Continue your course of treatment even though you feel ill unless your doctor tells you to stop. In some cases, you may be given additional medicines to help with side effects. Follow all directions for their use. Call your doctor or health care professional for advice if you get a fever, chills or sore throat, or other symptoms of a cold or flu. Do not treat yourself. Avoid taking products that contain aspirin, acetaminophen, ibuprofen, naproxen, or ketoprofen unless instructed by your doctor. These medicines may hide a fever. Do not become pregnant while taking this medicine. Women should inform their doctor if they wish to become pregnant or think they might be pregnant. There is a potential for serious side effects to an unborn child. Talk to your health care professional or pharmacist for more information. Do not breast-feed an infant while taking this medicine. Men may have a lower sperm count while taking this medicine. Talk to your doctor if you plan to father a child. What side effects may I notice from receiving this medicine? Side effects that you should report to your doctor or health care professional as soon as possible: -allergic reactions like skin rash, itching or hives,  swelling of the face, lips, or tongue -breathing problems -confusion or changes in emotions or moods -constipation -cough -mouth sores -muscle weakness -nausea and vomiting -pain, swelling, redness or irritation at the injection site -pain, tingling, numbness in the hands or feet -problems with balance, talking, walking -seizures -stomach pain -trouble passing urine or change in the amount of urine Side effects that usually do not require medical attention (report to your doctor or health care professional if they continue or are bothersome): -diarrhea -hair loss -jaw pain -loss of appetite This list may not describe all possible side effects. Call your doctor for medical advice about side effects. You may report side effects to FDA at 1-800-FDA-1088. Where should I keep my medicine? This drug is given in a hospital or clinic and will not be stored at home. NOTE: This sheet is a summary. It may not cover all possible information. If you have questions about this medicine, talk to your doctor, pharmacist, or health care provider.  2015, Elsevier/Gold Standard. (2007-10-11 17:1  Cyclophosphamide injection What is this medicine? CYCLOPHOSPHAMIDE (sye kloe FOSS fa mide) is a chemotherapy drug. It slows the growth of cancer cells. This medicine is used to treat many types of cancer like lymphoma, myeloma, leukemia, breast cancer, and ovarian cancer, to name a few. This medicine may be used for other purposes; ask your health care provider or pharmacist if you have questions. COMMON BRAND NAME(S): Cytoxan, Neosar What should I tell my health care provider before I take this medicine? They need to know if you have any of these conditions: -blood disorders -history of other chemotherapy -  infection -kidney disease -liver disease -recent or ongoing radiation therapy -tumors in the bone marrow -an unusual or allergic reaction to cyclophosphamide, other chemotherapy, other medicines,  foods, dyes, or preservatives -pregnant or trying to get pregnant -breast-feeding How should I use this medicine? This drug is usually given as an injection into a vein or muscle or by infusion into a vein. It is administered in a hospital or clinic by a specially trained health care professional. Talk to your pediatrician regarding the use of this medicine in children. Special care may be needed. Overdosage: If you think you have taken too much of this medicine contact a poison control center or emergency room at once. NOTE: This medicine is only for you. Do not share this medicine with others. What if I miss a dose? It is important not to miss your dose. Call your doctor or health care professional if you are unable to keep an appointment. What may interact with this medicine? This medicine may interact with the following medications: -amiodarone -amphotericin B -azathioprine -certain antiviral medicines for HIV or AIDS such as protease inhibitors (e.g., indinavir, ritonavir) and zidovudine -certain blood pressure medications such as benazepril, captopril, enalapril, fosinopril, lisinopril, moexipril, monopril, perindopril, quinapril, ramipril, trandolapril -certain cancer medications such as anthracyclines (e.g., daunorubicin, doxorubicin), busulfan, cytarabine, paclitaxel, pentostatin, tamoxifen, trastuzumab -certain diuretics such as chlorothiazide, chlorthalidone, hydrochlorothiazide, indapamide, metolazone -certain medicines that treat or prevent blood clots like warfarin -certain muscle relaxants such as succinylcholine -cyclosporine -etanercept -indomethacin -medicines to increase blood counts like filgrastim, pegfilgrastim, sargramostim -medicines used as general anesthesia -metronidazole -natalizumab This list may not describe all possible interactions. Give your health care provider a list of all the medicines, herbs, non-prescription drugs, or dietary supplements you use.  Also tell them if you smoke, drink alcohol, or use illegal drugs. Some items may interact with your medicine. What should I watch for while using this medicine? Visit your doctor for checks on your progress. This drug may make you feel generally unwell. This is not uncommon, as chemotherapy can affect healthy cells as well as cancer cells. Report any side effects. Continue your course of treatment even though you feel ill unless your doctor tells you to stop. Drink water or other fluids as directed. Urinate often, even at night. In some cases, you may be given additional medicines to help with side effects. Follow all directions for their use. Call your doctor or health care professional for advice if you get a fever, chills or sore throat, or other symptoms of a cold or flu. Do not treat yourself. This drug decreases your body's ability to fight infections. Try to avoid being around people who are sick. This medicine may increase your risk to bruise or bleed. Call your doctor or health care professional if you notice any unusual bleeding. Be careful brushing and flossing your teeth or using a toothpick because you may get an infection or bleed more easily. If you have any dental work done, tell your dentist you are receiving this medicine. You may get drowsy or dizzy. Do not drive, use machinery, or do anything that needs mental alertness until you know how this medicine affects you. Do not become pregnant while taking this medicine or for 1 year after stopping it. Women should inform their doctor if they wish to become pregnant or think they might be pregnant. Men should not father a child while taking this medicine and for 4 months after stopping it. There is a potential for serious side effects  to an unborn child. Talk to your health care professional or pharmacist for more information. Do not breast-feed an infant while taking this medicine. This medicine may interfere with the ability to have a  child. This medicine has caused ovarian failure in some women. This medicine has caused reduced sperm counts in some men. You should talk with your doctor or health care professional if you are concerned about your fertility. If you are going to have surgery, tell your doctor or health care professional that you have taken this medicine. What side effects may I notice from receiving this medicine? Side effects that you should report to your doctor or health care professional as soon as possible: -allergic reactions like skin rash, itching or hives, swelling of the face, lips, or tongue -low blood counts - this medicine may decrease the number of white blood cells, red blood cells and platelets. You may be at increased risk for infections and bleeding. -signs of infection - fever or chills, cough, sore throat, pain or difficulty passing urine -signs of decreased platelets or bleeding - bruising, pinpoint red spots on the skin, black, tarry stools, blood in the urine -signs of decreased red blood cells - unusually weak or tired, fainting spells, lightheadedness -breathing problems -dark urine -dizziness -palpitations -swelling of the ankles, feet, hands -trouble passing urine or change in the amount of urine -weight gain -yellowing of the eyes or skin Side effects that usually do not require medical attention (report to your doctor or health care professional if they continue or are bothersome): -changes in nail or skin color -hair loss -missed menstrual periods -mouth sores -nausea, vomiting This list may not describe all possible side effects. Call your doctor for medical advice about side effects. You may report side effects to FDA at 1-800-FDA-1088. Where should I keep my medicine? This drug is given in a hospital or clinic and will not be stored at home. NOTE: This sheet is a summary. It may not cover all possible information. If you have questions about this medicine, talk to your  doctor, pharmacist, or health care provider.  2015, [employer]/Gold Standard. (2011-11-28 16:22:58)  Rituximab injection What is this medicine? RITUXIMAB (ri TUX i mab) is a monoclonal antibody. This medicine changes the way the body's immune system works. It is used commonly to treat non-Hodgkin's lymphoma and other conditions. In cancer cells, this drug targets a specific protein within cancer cells and stops the cancer cells from growing. It is also used to treat rhuematoid arthritis (RA). In RA, this medicine slow the inflammatory process and help reduce joint pain and swelling. This medicine is often used with other cancer or arthritis medications. This medicine may be used for other purposes; ask your health care provider or pharmacist if you have questions. COMMON BRAND NAME(S): Rituxan What should I tell my health care provider before I take this medicine? They need to know if you have any of these conditions: -blood disorders -heart disease -history of hepatitis B -infection (especially a virus infection such as chickenpox, cold sores, or herpes) -irregular heartbeat -kidney disease -lung or breathing disease, like asthma -lupus -an unusual or allergic reaction to rituximab, mouse proteins, other medicines, foods, dyes, or preservatives -pregnant or trying to get pregnant -breast-feeding How should I use this medicine? This medicine is for infusion into a vein. It is administered in a hospital or clinic by a specially trained health care professional. A special MedGuide will be given to you by the pharmacist with each prescription  and refill. Be sure to read this information carefully each time. Talk to your pediatrician regarding the use of this medicine in children. This medicine is not approved for use in children. Overdosage: If you think you have taken too much of this medicine contact a poison control center or emergency room at once. NOTE: This medicine is only for you. Do  not share this medicine with others. What if I miss a dose? It is important not to miss a dose. Call your doctor or health care professional if you are unable to keep an appointment. What may interact with this medicine? -cisplatin -medicines for blood pressure -some other medicines for arthritis -vaccines This list may not describe all possible interactions. Give your health care provider a list of all the medicines, herbs, non-prescription drugs, or dietary supplements you use. Also tell them if you smoke, drink alcohol, or use illegal drugs. Some items may interact with your medicine. What should I watch for while using this medicine? Report any side effects that you notice during your treatment right away, such as changes in your breathing, fever, chills, dizziness or lightheadedness. These effects are more common with the first dose. Visit your prescriber or health care professional for checks on your progress. You will need to have regular blood work. Report any other side effects. The side effects of this medicine can continue after you finish your treatment. Continue your course of treatment even though you feel ill unless your doctor tells you to stop. Call your doctor or health care professional for advice if you get a fever, chills or sore throat, or other symptoms of a cold or flu. Do not treat yourself. This drug decreases your body's ability to fight infections. Try to avoid being around people who are sick. This medicine may increase your risk to bruise or bleed. Call your doctor or health care professional if you notice any unusual bleeding. Be careful brushing and flossing your teeth or using a toothpick because you may get an infection or bleed more easily. If you have any dental work done, tell your dentist you are receiving this medicine. Avoid taking products that contain aspirin, acetaminophen, ibuprofen, naproxen, or ketoprofen unless instructed by your doctor. These medicines  may hide a fever. Do not become pregnant while taking this medicine. Women should inform their doctor if they wish to become pregnant or think they might be pregnant. There is a potential for serious side effects to an unborn child. Talk to your health care professional or pharmacist for more information. Do not breast-feed an infant while taking this medicine. What side effects may I notice from receiving this medicine? Side effects that you should report to your doctor or health care professional as soon as possible: -allergic reactions like skin rash, itching or hives, swelling of the face, lips, or tongue -low blood counts - this medicine may decrease the number of white blood cells, red blood cells and platelets. You may be at increased risk for infections and bleeding. -signs of infection - fever or chills, cough, sore throat, pain or difficulty passing urine -signs of decreased platelets or bleeding - bruising, pinpoint red spots on the skin, black, tarry stools, blood in the urine -signs of decreased red blood cells - unusually weak or tired, fainting spells, lightheadedness -breathing problems -confused, not responsive -chest pain -fast, irregular heartbeat -feeling faint or lightheaded, falls -mouth sores -redness, blistering, peeling or loosening of the skin, including inside the mouth -stomach pain -swelling of the ankles,  feet, or hands -trouble passing urine or change in the amount of urine Side effects that usually do not require medical attention (report to your doctor or other health care professional if they continue or are bothersome): -anxiety -headache -loss of appetite -muscle aches -nausea -night sweats This list may not describe all possible side effects. Call your doctor for medical advice about side effects. You may report side effects to FDA at 1-800-FDA-1088. Where should I keep my medicine? This drug is given in a hospital or clinic and will not be stored at  home. NOTE: This sheet is a summary. It may not cover all possible information. If you have questions about this medicine, talk to your doctor, pharmacist, or health care provider.  2015, Elsevier/Gold Standard. (2007-09-13 14:04:59)

## 2014-01-02 NOTE — Progress Notes (Signed)
Chemo teaching done and consent signed for Rituxan, Adriamycin, Cytoxan, Vincristine, & Prednisone. Chemo and med calendar given to patient.

## 2014-01-02 NOTE — Progress Notes (Signed)
Marissa Turner Tolerated chemotherapy well today.  Discharged ambulatory.

## 2014-01-02 NOTE — Patient Instructions (Signed)
[facility] Discharge Instructions  RECOMMENDATIONS MADE BY THE CONSULTANT AND ANY TEST RESULTS WILL BE SENT TO YOUR REFERRING PHYSICIAN.  Today is your first treatment of R-CHOP chemotherapy.  Lupita Raider completed chemotherapy teaching with you today. We will perfom labs in 7-10 days and see you back to see how cycle 1 of chemotherapy went. We will then see you back in 3 weeks for cycle 2 of chemotherapy and office appointment. We will perform a PET scan after cycle 2 of chemotherapy to see how the chemotherapy regimen is working. I will be attempting to add an oral chemotherapy medication to your regimen in the future if you insurance approves the medication.  To be determined.  We will let you know either way.  Please call the [facility] with any questions or concerns.   Thank you for choosing [facility] to provide your oncology and hematology care.  To afford each patient quality time with our providers, please arrive at least 15 minutes before your scheduled appointment time.  With your help, our goal is to use those 15 minutes to complete the necessary work-up to ensure our physicians have the information they need to help with your evaluation and healthcare recommendations.    Effective January 1st, 2014, we ask that you re-schedule your appointment with our physicians should you arrive 10 or more minutes late for your appointment.  We strive to give you quality time with our providers, and arriving late affects you and other patients whose appointments are after yours.    Again, thank you for choosing [facility].  Our hope is that these requests will decrease the amount of time that you wait before being seen by our physicians.       _____________________________________________________________  Should you have questions after your visit to [facility], please contact our office at [phone] between the  hours of 8:30 a.m. and 5:00 p.m.  Voicemails left after 4:30 p.m. will not be returned until the following business day.  For prescription refill requests, have your pharmacy contact our office with your prescription refill request.

## 2014-01-03 ENCOUNTER — Telehealth (HOSPITAL_COMMUNITY): Payer: Self-pay | Admitting: *Deleted

## 2014-01-03 NOTE — Telephone Encounter (Signed)
24h follow up: Patient states that she is feeling all right today. She had diarrhea last pm but she had a gallon of ice cream. She said she ate a lot of food last night because she couldn't get enough to eat. Patient said her head hurt a little bit this am but she took 1 Tylenol and it got better. Patient states that she has energy. Patient says she wore a mask this am while she wore the mask. Patient took her Prednisone this am. I reminded patient that on Saturday morning she may start feeling extremely tired or worn down due to not taking the Prednisone. She said ok.

## 2014-01-04 ENCOUNTER — Encounter (HOSPITAL_BASED_OUTPATIENT_CLINIC_OR_DEPARTMENT_OTHER): Payer: Medicare Other

## 2014-01-04 ENCOUNTER — Ambulatory Visit (INDEPENDENT_AMBULATORY_CARE_PROVIDER_SITE_OTHER): Payer: Medicare Other | Admitting: *Deleted

## 2014-01-04 ENCOUNTER — Other Ambulatory Visit (HOSPITAL_COMMUNITY): Payer: Self-pay | Admitting: Oncology

## 2014-01-04 ENCOUNTER — Encounter (HOSPITAL_COMMUNITY): Payer: Self-pay

## 2014-01-04 DIAGNOSIS — C8331 Diffuse large B-cell lymphoma, lymph nodes of head, face, and neck: Secondary | ICD-10-CM | POA: Diagnosis not present

## 2014-01-04 DIAGNOSIS — Z5181 Encounter for therapeutic drug level monitoring: Secondary | ICD-10-CM

## 2014-01-04 DIAGNOSIS — Z5189 Encounter for other specified aftercare: Secondary | ICD-10-CM | POA: Diagnosis not present

## 2014-01-04 DIAGNOSIS — I482 Chronic atrial fibrillation, unspecified: Secondary | ICD-10-CM

## 2014-01-04 DIAGNOSIS — I4891 Unspecified atrial fibrillation: Secondary | ICD-10-CM | POA: Diagnosis not present

## 2014-01-04 DIAGNOSIS — C8580 Other specified types of non-Hodgkin lymphoma, unspecified site: Secondary | ICD-10-CM

## 2014-01-04 DIAGNOSIS — Z7901 Long term (current) use of anticoagulants: Secondary | ICD-10-CM | POA: Diagnosis not present

## 2014-01-04 LAB — BETA 2 MICROGLOBULIN, SERUM: BETA 2 MICROGLOBULIN: 3.02 mg/L — AB (ref <=2.51)

## 2014-01-04 LAB — POCT INR: INR: 1.8

## 2014-01-04 MED ORDER — PEGFILGRASTIM INJECTION 6 MG/0.6ML ~~LOC~~
6.0000 mg | PREFILLED_SYRINGE | Freq: Once | SUBCUTANEOUS | Status: AC
  Administered 2014-01-04: 6 mg via SUBCUTANEOUS

## 2014-01-04 MED ORDER — PEGFILGRASTIM INJECTION 6 MG/0.6ML ~~LOC~~
PREFILLED_SYRINGE | SUBCUTANEOUS | Status: AC
  Filled 2014-01-04: qty 0.6

## 2014-01-04 NOTE — Progress Notes (Signed)
Marissa Turner's reason for visit today is for an injection and labs as scheduled per MD orders.  Marissa Turner also received neulasta per MD orders; see [facility] for administration details.  Marissa Turner tolerated all procedures well and without incident; questions were answered and patient was discharged.

## 2014-01-04 NOTE — Patient Instructions (Signed)
You had a neulasta injection.  PLease call the clinic if you have any questions or conerns

## 2014-01-05 ENCOUNTER — Telehealth (HOSPITAL_COMMUNITY): Payer: Self-pay | Admitting: *Deleted

## 2014-01-05 NOTE — Telephone Encounter (Signed)
Patient called (and left a voicemail) to let me know that she was not feeling good since she got the Neulasta injection. She felt bad after getting the Neulasta injection with her treatments of Rituxan/Bendamustine. I called back and left patient a message on her answering machine and told her that I think she is right - that the Neulasta is causing her to feel bad. I also told her that it is quite possible that the new chemo she has received is starting to make her feel a little run down and fatigued. I told her to balance "working" with rest and to definitely not over do it. I told her to make sure she is staying hydrated and that her bowels are moving. I told patient to not let this get her down and that it is expected that she is going to feel more tired with this chemo. I asked her to call me back and let me know how she was doing when she got my message.

## 2014-01-06 ENCOUNTER — Encounter (HOSPITAL_BASED_OUTPATIENT_CLINIC_OR_DEPARTMENT_OTHER): Payer: Medicare Other | Admitting: Oncology

## 2014-01-06 VITALS — BP 121/89 | HR 90 | Temp 98.8°F | Resp 18 | Wt 116.1 lb

## 2014-01-06 DIAGNOSIS — G4700 Insomnia, unspecified: Secondary | ICD-10-CM | POA: Diagnosis not present

## 2014-01-06 DIAGNOSIS — C8331 Diffuse large B-cell lymphoma, lymph nodes of head, face, and neck: Secondary | ICD-10-CM | POA: Diagnosis not present

## 2014-01-06 DIAGNOSIS — C8580 Other specified types of non-Hodgkin lymphoma, unspecified site: Secondary | ICD-10-CM

## 2014-01-06 MED ORDER — HYDROCODONE-ACETAMINOPHEN 5-325 MG PO TABS: 0.5000 | ORAL_TABLET | Freq: Four times a day (QID) | ORAL | Status: DC | PRN

## 2014-01-06 NOTE — Progress Notes (Addendum)
The patient is seen as a work-in today for a "racing heart" that she reported yesterday to our nurse navigator.  Ted received her first cycle of R-CHOP on 01/02/2014.  She notes that she felt great during chemotherapy and after chemotherapy, but the day of the neulasta injection, she became tired and weak x 3 days.  It quickly resolved thereafter.  She blames this on the injection, but I think it is the anticipated delayed effect of systemic chemotherapy.  She also notes during this time leg pain that lasts x 3 days.  This is likely from Neulasta.  I will give her an Rx for Hydrocodone that she can take to help with this discomfort.    She also notes that she is having difficulty soleeping at night during this time and this is likely multifactorial and from neulasta-induced leg pain and hyperactivity from high dose Prednisone.  Hopefully Hydrocodone will help.  I provided her education regarding prednisone-induced hyperactivity and sensations noted on high dose steroids including jitteriness and "heart racing."   In the past, she notes that Xanax helped with her anxiety.  It is noted that she has this medication on her med list.  I spoke to the pharmacist at [facility] and she confirmed that the patient has refills.   Her side effects are expected with her current chemotherapy regimen.  Case was discussed with Dr. Whitney Muse and she would like to change her treatment course.  We will cancel Revlimid as part of her treatment.  She will receive 2 cycles of R-CHOP followed by a PET scan.  Depending on results, we will proceed with one more cycle of chemotherapy and then move on to Radiation therapy to her single involvement of disease in the vallecula.  Case discussed with Dr. Pablo Ledger and she agrees with this treatment plan.  We will get her a Rad Onc consult following her PET scan after cycle 2.   Patient and plan discussed with Dr. Ancil Linsey and Dr. Thea Silversmith and they are in  agreement with the aforementioned.   More than 50% of the time spent with the patient was utilized for counseling and coordination of care.  Marissa Turner  01/06/2014

## 2014-01-10 ENCOUNTER — Other Ambulatory Visit (HOSPITAL_COMMUNITY): Payer: Self-pay | Admitting: Oncology

## 2014-01-10 ENCOUNTER — Ambulatory Visit (HOSPITAL_COMMUNITY): Payer: Medicare Other | Admitting: Oncology

## 2014-01-10 DIAGNOSIS — K1379 Other lesions of oral mucosa: Secondary | ICD-10-CM

## 2014-01-10 MED ORDER — FIRST-DUKES MOUTHWASH MT SUSP: 15.0000 mL | Freq: Four times a day (QID) | OROMUCOSAL | Status: DC | PRN

## 2014-01-16 ENCOUNTER — Ambulatory Visit (HOSPITAL_COMMUNITY): Payer: Medicare Other

## 2014-01-16 ENCOUNTER — Inpatient Hospital Stay (HOSPITAL_COMMUNITY): Payer: Medicare Other

## 2014-01-17 ENCOUNTER — Inpatient Hospital Stay (HOSPITAL_COMMUNITY): Payer: Medicare Other

## 2014-01-18 ENCOUNTER — Ambulatory Visit (HOSPITAL_COMMUNITY): Payer: Medicare Other

## 2014-01-22 NOTE — Progress Notes (Signed)
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  INTERVAL HISTORY: LINETTE GUNDERSON 73 y.o. female returns for additional therapy of her NHL.  She continues to do well.  She tolerated her first cycle of RCHOP without any problems.  She had initially been treated with Treanda/Rituxan but was felt to have evidence of progression on imaging and therapy was changed. She did not have a BMBX as part of her staging.   MEDICAL HISTORY: Past Medical History  Diagnosis Date  . Atrial fibrillation, chronic 2004    left atrial thrombus in 2004; near syncope in 2008; echocardiogram in 2008-normal EF, mild left atrial enlargement, no valvular abnormalities, lipomatous hypertrophy; no aortic atheroma on TEE in 2004  . Chronic anticoagulation 2004    warfarin  . Tobacco abuse     40 pack years; quit attempt in 2012  . DJD (degenerative joint disease), lumbosacral     Also hip  . Peptic ulcer disease     s/p Billroth II  . Depression   . Fracture of wrist     Left  . Hypertension     Normal carotid ultrasound in 2008  . PONV (postoperative nausea and vomiting)   . Dysrhythmia     chronic AFib  . Cancer     has SPINAL STENOSIS; DJD (degenerative joint disease), lumbosacral; Atrial fibrillation, chronic; Chronic anticoagulation; Tobacco abuse; Peptic ulcer disease; Hypertension; Encounter for therapeutic drug monitoring; Lymphoma malignant, large cell; and Iron deficiency on her problem list.      Lymphoma malignant, large cell   09/15/2013 Initial Diagnosis Diagnosis Lymph node, biopsy, cervical left neck - DIFFUSE LARGE B CELL LYMPHOMA.   10/06/2013 PET scan Hypermetabolic thickening within the right vallecular consists with primary head and neck cancer.  Single enlarged hypermetabolic right level IIa lymph node consistent  local nodal metastasis.   10/31/2013 - 11/29/2013 Chemotherapy Bendamustine/Rituxan x 2 cycles   12/19/2013 Progression PET- Progression of presumed vallecular lymphoma as evidenced by increased hypermetabolism.   12/20/2013 Treatment Plan Change Per Dr. Gari Crown, patient has a CD10 negative, non-germinal center non-Hodgkin's lymphoma.  Patient may be a candidate for additional of Revlimid to treatment plan.   12/28/2013 Echocardiogram MUGA- Normal left ventricular wall motion with calculated ejection fraction of 60%.   01/02/2014 -  Chemotherapy R-CHOP with Neulasta support.     is allergic to diphenhydramine hcl; estrogens; penicillins; and codeine.  Ms. Maduro does not currently have medications on file.  SURGICAL HISTORY: Past Surgical History  Procedure Laterality Date  . Cholecystectomy  2004    Hodgeman II; splenectomy; incidental appendectomy  . Abdominal hysterectomy      w/o oophorectomy  . Cesarean section    . Tonsillectomy    . Colonoscopy  1980s    patient denies ever having undergone colonoscopy as of 2014  . Appendectomy    . Splenectomy, total    . Lymph node biopsy Left 09/15/2013    Procedure: LEFT NECK CERVICAL NODE BIOPSY;  Surgeon: Scherry Ran, MD;  Location: [facility];  Service: General;  Laterality: Left;  . Portacath placement Right 10/21/2013    Procedure: ATTEMPTED INSERTION PORT-A-CATH;  Surgeon: Scherry Ran, MD;  Location: [facility];  Service: General;  Laterality: Right;    SOCIAL HISTORY: History  Social History  . Marital Status: Married    Spouse Name: N/A    Number of Children: 2  . Years of Education: N/A   Occupational History  . Housecleaning    Social History Main Topics  . Smoking status: Current Every Day Smoker -- 0.25 packs/day for 55 years    Types: Cigarettes  . Smokeless tobacco: Not on file     Comment: Quit attempt in 2012  . Alcohol Use: No  . Drug Use: No  . Sexual  Activity: Yes    Birth Control/ Protection: Surgical   Other Topics Concern  . Not on file   Social History Narrative   Married with one child and one adopted child, 1 stillbirth, and one deceased child at age 45 due to illness   No regular exercise    FAMILY HISTORY: Family History  Problem Relation Age of Onset  . Heart attack Mother     also brother  . Breast cancer Sister   . Heart attack Brother   . Breast cancer Sister     x2  . Cirrhosis Sister   . Cancer Brother     Gastric carcinoma; also father    Review of Systems  Constitutional: Negative.   HENT: Negative.   Eyes: Negative.   Respiratory: Negative.   Cardiovascular: Negative.   Gastrointestinal: Negative.   Genitourinary: Negative.   Musculoskeletal: Negative.   Skin: Negative.   Neurological: Negative.   Endo/Heme/Allergies: Negative.   Psychiatric/Behavioral: Negative.     PHYSICAL EXAMINATION  ECOG PERFORMANCE STATUS: 0 - Asymptomatic  Filed Vitals:   01/23/14 0800  BP: 126/65  Pulse: 65  Temp: 97.6 F (36.4 C)  Resp: 20    Physical Exam  Constitutional: She is oriented to person, place, and time and well-developed, well-nourished, and in no distress.  Alopecia, wearing a scarf  HENT:  Head: Normocephalic and atraumatic.  Mouth/Throat: Oropharynx is clear and moist. No oropharyngeal exudate.  Eyes: Conjunctivae and EOM are normal. Pupils are equal, round, and reactive to light. Right eye exhibits no discharge. Left eye exhibits no discharge.  Neck: Normal range of motion. Neck supple. No JVD present. No thyromegaly present.  Cardiovascular: Normal rate, regular rhythm and normal heart sounds.   No murmur heard. Pulmonary/Chest: Effort normal and breath sounds normal. No respiratory distress. She has no wheezes. She has no rales. She exhibits no tenderness.  Abdominal: Soft. Bowel sounds are normal. She exhibits no distension and no mass. There is no tenderness. There is no rebound and no  guarding.  Musculoskeletal: Normal range of motion. She exhibits no edema.  Lymphadenopathy:    She has no cervical adenopathy.  Neurological: She is alert and oriented to person, place, and time. She displays normal reflexes. No cranial nerve deficit. Coordination normal.  Skin: Skin is warm and dry. No rash noted. No erythema. No pallor.  Psychiatric: Mood, memory, affect and judgment normal.    LABORATORY DATA:  CBC    Component Value Date/Time   WBC 7.6 01/23/2014 0817   RBC 3.65* 01/23/2014 0817   RBC 4.84 10/10/2013 1502   HGB 11.2* 01/23/2014 0817   HCT 34.8* 01/23/2014 0817   PLT 382 01/23/2014 0817   MCV 95.3 01/23/2014 0817   MCH 30.7 01/23/2014 0817   MCHC 32.2 01/23/2014 0817   RDW 19.4* 01/23/2014 0817   LYMPHSABS 1.5 01/23/2014 0817   MONOABS 1.8* 01/23/2014 0817   EOSABS 0.1 01/23/2014 0817   BASOSABS 0.1 01/23/2014 6962  CMP     Component Value Date/Time   NA 142 01/23/2014 0817   K 3.1* 01/23/2014 0817   CL 103 01/23/2014 0817   CO2 33* 01/23/2014 0817   GLUCOSE 118* 01/23/2014 0817   BUN 8 01/23/2014 0817   CREATININE 0.51 01/23/2014 0817   CREATININE 0.65 06/30/2013 0840   CALCIUM 8.4 01/23/2014 0817   PROT 5.9* 01/23/2014 0817   ALBUMIN 3.1* 01/23/2014 0817   AST 26 01/23/2014 0817   ALT 22 01/23/2014 0817   ALKPHOS 62 01/23/2014 0817   BILITOT 0.1* 01/23/2014 0817   GFRNONAA >90 01/23/2014 0817   GFRAA >90 01/23/2014 0817     PATHOLOGY: Interpretation Tissue-Flow Cytometry - PREDOMINANCE OF B CELLS. - SEE NOTE. Diagnosis Comment: Analysis of the lymphoid population shows predominance of B cells displaying pan B cell antigens including CD20 with lack of CD5 or CD10. B cells display lambda light chain excess. The overall features are worrisome for a B cell lymphoproliferative process.  Diagnosis Lymph node, biopsy, cervical left neck - DIFFUSE LARGE B CELL LYMPHOMA. - SEE ONCOLOGY TABLE.  ASSESSMENT and THERAPY PLAN:    Lymphoma  malignant, large cell This is a pleasant 73 year old female who has been diagnosed with diffuse large cell lymphoma of the vallecula. She did not have bone marrow biopsy at the time of her diagnosis. Based upon imaging she was felt to have early stage disease, stage IE, and started on Treanda and Rituxan. It was spelled additional imaging after 2 cycles of Treanda and Rituxan suggested possible progression; therefore, she was changed to R CHOP. She is here today for her second cycle of R CHOP and has done amazingly well with therapy. I have recommended repeat imaging with PET. I will see her back after PET imaging and plan most likely will be for 3 cycles of R CHOP followed by radiation. We will discuss the fact she did not have a bone marrow biopsy and see if there is any need for an LP. I will see her back as detailed.    All questions were answered. The patient knows to call the clinic with any problems, questions or concerns. We can certainly see the patient much sooner if necessary. Molli Hazard 02/04/2014

## 2014-01-23 ENCOUNTER — Encounter (HOSPITAL_BASED_OUTPATIENT_CLINIC_OR_DEPARTMENT_OTHER): Payer: Medicare Other | Admitting: Hematology & Oncology

## 2014-01-23 ENCOUNTER — Encounter (HOSPITAL_BASED_OUTPATIENT_CLINIC_OR_DEPARTMENT_OTHER): Payer: Medicare Other

## 2014-01-23 ENCOUNTER — Other Ambulatory Visit (HOSPITAL_COMMUNITY): Payer: Self-pay | Admitting: Oncology

## 2014-01-23 ENCOUNTER — Inpatient Hospital Stay (HOSPITAL_COMMUNITY): Payer: Medicare Other

## 2014-01-23 ENCOUNTER — Ambulatory Visit (HOSPITAL_COMMUNITY): Payer: Medicare Other | Admitting: Hematology & Oncology

## 2014-01-23 VITALS — BP 126/65 | HR 65 | Temp 97.6°F | Resp 20 | Wt 120.0 lb

## 2014-01-23 DIAGNOSIS — C8331 Diffuse large B-cell lymphoma, lymph nodes of head, face, and neck: Secondary | ICD-10-CM

## 2014-01-23 DIAGNOSIS — E876 Hypokalemia: Secondary | ICD-10-CM

## 2014-01-23 DIAGNOSIS — Z5112 Encounter for antineoplastic immunotherapy: Secondary | ICD-10-CM | POA: Diagnosis not present

## 2014-01-23 DIAGNOSIS — C8519 Unspecified B-cell lymphoma, extranodal and solid organ sites: Secondary | ICD-10-CM | POA: Diagnosis not present

## 2014-01-23 DIAGNOSIS — Z5111 Encounter for antineoplastic chemotherapy: Secondary | ICD-10-CM

## 2014-01-23 DIAGNOSIS — C8580 Other specified types of non-Hodgkin lymphoma, unspecified site: Secondary | ICD-10-CM

## 2014-01-23 LAB — COMPREHENSIVE METABOLIC PANEL
ALT: 22 U/L (ref 0–35)
AST: 26 U/L (ref 0–37)
Albumin: 3.1 g/dL — ABNORMAL LOW (ref 3.5–5.2)
Alkaline Phosphatase: 62 U/L (ref 39–117)
Anion gap: 6 (ref 5–15)
BUN: 8 mg/dL (ref 6–23)
CALCIUM: 8.4 mg/dL (ref 8.4–10.5)
CO2: 33 mmol/L — ABNORMAL HIGH (ref 19–32)
CREATININE: 0.51 mg/dL (ref 0.50–1.10)
Chloride: 103 mEq/L (ref 96–112)
GFR calc non Af Amer: >90 mL/min (ref >90)
Glucose, Bld: 118 mg/dL — ABNORMAL HIGH (ref 70–99)
Potassium: 3.1 mmol/L — ABNORMAL LOW (ref 3.5–5.1)
SODIUM: 142 mmol/L (ref 135–145)
Total Bilirubin: 0.1 mg/dL — ABNORMAL LOW (ref 0.3–1.2)
Total Protein: 5.9 g/dL — ABNORMAL LOW (ref 6.0–8.3)

## 2014-01-23 LAB — CBC WITH DIFFERENTIAL/PLATELET
BASOS PCT: 1 % (ref 0–1)
Basophils Absolute: 0.1 10*3/uL (ref 0.0–0.1)
EOS PCT: 1 % (ref 0–5)
Eosinophils Absolute: 0.1 10*3/uL (ref 0.0–0.7)
HEMATOCRIT: 34.8 % — AB (ref 36.0–46.0)
Hemoglobin: 11.2 g/dL — ABNORMAL LOW (ref 12.0–15.0)
Lymphocytes Relative: 20 % (ref 12–46)
Lymphs Abs: 1.5 10*3/uL (ref 0.7–4.0)
MCH: 30.7 pg (ref 26.0–34.0)
MCHC: 32.2 g/dL (ref 30.0–36.0)
MCV: 95.3 fL (ref 78.0–100.0)
Monocytes Absolute: 1.8 10*3/uL — ABNORMAL HIGH (ref 0.1–1.0)
Monocytes Relative: 23 % — ABNORMAL HIGH (ref 3–12)
NEUTROS PCT: 55 % (ref 43–77)
Neutro Abs: 4.2 10*3/uL (ref 1.7–7.7)
Platelets: 382 10*3/uL (ref 150–400)
RBC: 3.65 MIL/uL — AB (ref 3.87–5.11)
RDW: 19.4 % — AB (ref 11.5–15.5)
WBC: 7.6 10*3/uL (ref 4.0–10.5)

## 2014-01-23 LAB — LACTATE DEHYDROGENASE: LDH: 162 U/L (ref 94–250)

## 2014-01-23 LAB — SEDIMENTATION RATE: SED RATE: 60 mm/h — AB (ref 0–22)

## 2014-01-23 MED ORDER — SODIUM CHLORIDE 0.9 % IJ SOLN: 10.0000 mL | INTRAMUSCULAR | Status: DC | PRN

## 2014-01-23 MED ORDER — HEPARIN SOD (PORK) LOCK FLUSH 100 UNIT/ML IV SOLN
INTRAVENOUS | Status: AC
  Filled 2014-01-23: qty 5

## 2014-01-23 MED ORDER — DOXORUBICIN HCL CHEMO IV INJECTION 2 MG/ML
50.0000 mg/m2 | Freq: Once | INTRAVENOUS | Status: AC
  Administered 2014-01-23: 76 mg via INTRAVENOUS
  Filled 2014-01-23: qty 38

## 2014-01-23 MED ORDER — ACETAMINOPHEN 325 MG PO TABS
650.0000 mg | ORAL_TABLET | Freq: Once | ORAL | Status: AC
  Administered 2014-01-23: 650 mg via ORAL
  Filled 2014-01-23: qty 2

## 2014-01-23 MED ORDER — SODIUM CHLORIDE 0.9 % IV SOLN
375.0000 mg/m2 | Freq: Once | INTRAVENOUS | Status: AC
  Administered 2014-01-23: 600 mg via INTRAVENOUS
  Filled 2014-01-23: qty 60

## 2014-01-23 MED ORDER — SODIUM CHLORIDE 0.9 % IV SOLN
Freq: Once | INTRAVENOUS | Status: AC
  Administered 2014-01-23: 16 mg via INTRAVENOUS
  Filled 2014-01-23: qty 8

## 2014-01-23 MED ORDER — SODIUM CHLORIDE 0.9 % IV SOLN
750.0000 mg/m2 | Freq: Once | INTRAVENOUS | Status: AC
  Administered 2014-01-23: 1140 mg via INTRAVENOUS
  Filled 2014-01-23: qty 57

## 2014-01-23 MED ORDER — HEPARIN SOD (PORK) LOCK FLUSH 100 UNIT/ML IV SOLN
500.0000 [IU] | Freq: Once | INTRAVENOUS | Status: AC | PRN
  Administered 2014-01-23: 500 [IU]

## 2014-01-23 MED ORDER — POTASSIUM CHLORIDE CRYS ER 20 MEQ PO TBCR: 20.0000 meq | EXTENDED_RELEASE_TABLET | Freq: Two times a day (BID) | ORAL | Status: DC

## 2014-01-23 MED ORDER — LORATADINE 10 MG PO TABS
10.0000 mg | ORAL_TABLET | Freq: Every day | ORAL | Status: DC
  Administered 2014-01-23: 10 mg via ORAL
  Filled 2014-01-23: qty 1

## 2014-01-23 MED ORDER — SODIUM CHLORIDE 0.9 % IV SOLN
Freq: Once | INTRAVENOUS | Status: AC
  Administered 2014-01-23: 09:00:00 via INTRAVENOUS

## 2014-01-23 MED ORDER — VINCRISTINE SULFATE CHEMO INJECTION 1 MG/ML
2.0000 mg | Freq: Once | INTRAVENOUS | Status: AC
  Administered 2014-01-23: 2 mg via INTRAVENOUS
  Filled 2014-01-23: qty 2

## 2014-01-23 NOTE — Progress Notes (Signed)
Marissa Turner Tolerated chemotherapy well today, discharged ambulatory

## 2014-01-23 NOTE — Patient Instructions (Signed)
[facility] Discharge Instructions for Patients Receiving Chemotherapy  Today you received the following chemotherapy agents doxorubicin, vincristine, cyclophospamide, rituxan Please call if you have any questions or concerns.  You will come back Wednesday for your neulasta injection.  To help prevent nausea and vomiting after your treatment, we encourage you to take your nausea medication    If you develop nausea and vomiting that is not controlled by your nausea medication, call the clinic. If it is after clinic hours your family physician or the after hours number for the clinic or go to the Emergency Department.   BELOW ARE SYMPTOMS THAT SHOULD BE REPORTED IMMEDIATELY:  *FEVER GREATER THAN 101.0 F  *CHILLS WITH OR WITHOUT FEVER  NAUSEA AND VOMITING THAT IS NOT CONTROLLED WITH YOUR NAUSEA MEDICATION  *UNUSUAL SHORTNESS OF BREATH  *UNUSUAL BRUISING OR BLEEDING  TENDERNESS IN MOUTH AND THROAT WITH OR WITHOUT PRESENCE OF ULCERS  *URINARY PROBLEMS  *BOWEL PROBLEMS  UNUSUAL RASH Items with * indicate a potential emergency and should be followed up as soon as possible.  One of the nurses will contact you 24 hours after your treatment. Please let the nurse know about any problems that you may have experienced. Feel free to call the clinic you have any questions or concerns. The clinic phone number is [phone].   I have been informed and understand all the instructions given to me. I know to contact the clinic, my physician, or go to the Emergency Department if any problems should occur. I do not have any questions at this time, but understand that I may call the clinic during office hours or the Patient Navigator at [phone] should I have any questions or need assistance in obtaining follow up care.    Doxorubicin injection What is this medicine? DOXORUBICIN (dox oh ROO bi sin) is a chemotherapy drug. It is used to treat many kinds of cancer like  Hodgkin's disease, leukemia, non-Hodgkin's lymphoma, neuroblastoma, sarcoma, and Wilms' tumor. It is also used to treat bladder cancer, breast cancer, lung cancer, ovarian cancer, stomach cancer, and thyroid cancer. This medicine may be used for other purposes; ask your health care provider or pharmacist if you have questions. COMMON BRAND NAME(S): Adriamycin, Adriamycin PFS, Adriamycin RDF, Rubex What should I tell my health care provider before I take this medicine? They need to know if you have any of these conditions: -blood disorders -heart disease, recent heart attack -infection (especially a virus infection such as chickenpox, cold sores, or herpes) -irregular heartbeat -liver disease -recent or ongoing radiation therapy -an unusual or allergic reaction to doxorubicin, other chemotherapy agents, other medicines, foods, dyes, or preservatives -pregnant or trying to get pregnant -breast-feeding How should I use this medicine? This drug is given as an infusion into a vein. It is administered in a hospital or clinic by a specially trained health care professional. If you have pain, swelling, burning or any unusual feeling around the site of your injection, tell your health care professional right away. Talk to your pediatrician regarding the use of this medicine in children. Special care may be needed. Overdosage: If you think you have taken too much of this medicine contact a poison control center or emergency room at once. NOTE: This medicine is only for you. Do not share this medicine with others. What if I miss a dose? It is important not to miss your dose. Call your doctor or health care professional if you are unable to keep an appointment. What  may interact with this medicine? Do not take this medicine with any of the following medications: -cisapride -droperidol -halofantrine -pimozide -zidovudine This medicine may also interact with the following  medications: -chloroquine -chlorpromazine -clarithromycin -cyclophosphamide -cyclosporine -erythromycin -medicines for depression, anxiety, or psychotic disturbances -medicines for irregular heart beat like amiodarone, bepridil, dofetilide, encainide, flecainide, propafenone, quinidine -medicines for seizures like ethotoin, fosphenytoin, phenytoin -medicines for nausea, vomiting like dolasetron, ondansetron, palonosetron -medicines to increase blood counts like filgrastim, pegfilgrastim, sargramostim -methadone -methotrexate -pentamidine -progesterone -vaccines -verapamil Talk to your doctor or health care professional before taking any of these medicines: -acetaminophen -aspirin -ibuprofen -ketoprofen -naproxen This list may not describe all possible interactions. Give your health care provider a list of all the medicines, herbs, non-prescription drugs, or dietary supplements you use. Also tell them if you smoke, drink alcohol, or use illegal drugs. Some items may interact with your medicine. What should I watch for while using this medicine? Your condition will be monitored carefully while you are receiving this medicine. You will need important blood work done while you are taking this medicine. This drug may make you feel generally unwell. This is not uncommon, as chemotherapy can affect healthy cells as well as cancer cells. Report any side effects. Continue your course of treatment even though you feel ill unless your doctor tells you to stop. Your urine may turn red for a few days after your dose. This is not blood. If your urine is dark or brown, call your doctor. In some cases, you may be given additional medicines to help with side effects. Follow all directions for their use. Call your doctor or health care professional for advice if you get a fever, chills or sore throat, or other symptoms of a cold or flu. Do not treat yourself. This drug decreases your body's ability to  fight infections. Try to avoid being around people who are sick. This medicine may increase your risk to bruise or bleed. Call your doctor or health care professional if you notice any unusual bleeding. Be careful brushing and flossing your teeth or using a toothpick because you may get an infection or bleed more easily. If you have any dental work done, tell your dentist you are receiving this medicine. Avoid taking products that contain aspirin, acetaminophen, ibuprofen, naproxen, or ketoprofen unless instructed by your doctor. These medicines may hide a fever. Men and women of childbearing age should use effective birth control methods while using taking this medicine. Do not become pregnant while taking this medicine. There is a potential for serious side effects to an unborn child. Talk to your health care professional or pharmacist for more information. Do not breast-feed an infant while taking this medicine. Do not let others touch your urine or other body fluids for 5 days after each treatment with this medicine. Caregivers should wear latex gloves to avoid touching body fluids during this time. There is a maximum amount of this medicine you should receive throughout your life. The amount depends on the medical condition being treated and your overall health. Your doctor will watch how much of this medicine you receive in your lifetime. Tell your doctor if you have taken this medicine before. What side effects may I notice from receiving this medicine? Side effects that you should report to your doctor or health care professional as soon as possible: -allergic reactions like skin rash, itching or hives, swelling of the face, lips, or tongue -low blood counts - this medicine may decrease the number of  white blood cells, red blood cells and platelets. You may be at increased risk for infections and bleeding. -signs of infection - fever or chills, cough, sore throat, pain or difficulty passing  urine -signs of decreased platelets or bleeding - bruising, pinpoint red spots on the skin, black, tarry stools, blood in the urine -signs of decreased red blood cells - unusually weak or tired, fainting spells, lightheadedness -breathing problems -chest pain -fast, irregular heartbeat -mouth sores -nausea, vomiting -pain, swelling, redness at site where injected -pain, tingling, numbness in the hands or feet -swelling of ankles, feet, or hands -unusual bleeding or bruising Side effects that usually do not require medical attention (report to your doctor or health care professional if they continue or are bothersome): -diarrhea -facial flushing -hair loss -loss of appetite -missed menstrual periods -nail discoloration or damage -red or watery eyes -red colored urine -stomach upset This list may not describe all possible side effects. Call your doctor for medical advice about side effects. You may report side effects to FDA at 1-800-FDA-1088. Where should I keep my medicine? This drug is given in a hospital or clinic and will not be stored at home. NOTE: This sheet is a summary. It may not cover all possible information. If you have questions about this medicine, talk to your doctor, pharmacist, or health care provider.  2015, [employer] Standard. (2012-05-11 09:54:34)   Vincristine injection What is this medicine? VINCRISTINE (vin KRIS teen) is a chemotherapy drug. It slows the growth of cancer cells. This medicine is used to treat many types of cancer like Hodgkin's disease, leukemia, non-Hodgkin's lymphoma, neuroblastoma (brain cancer), rhabdomyosarcoma, and Wilms' tumor. This medicine may be used for other purposes; ask your health care provider or pharmacist if you have questions. COMMON BRAND NAME(S): Oncovin, Vincasar PFS What should I tell my health care provider before I take this medicine? They need to know if you have any of these conditions: -blood  disorders -gout -infection (especially chickenpox, cold sores, or herpes) -kidney disease -liver disease -lung disease -nervous system disease like Charcot-Marie-Tooth (CMT) -recent or ongoing radiation therapy -an unusual or allergic reaction to vincristine, other chemotherapy agents, other medicines, foods, dyes, or preservatives -pregnant or trying to get pregnant -breast-feeding How should I use this medicine? This drug is given as an infusion into a vein. It is administered in a hospital or clinic by a specially trained health care professional. If you have pain, swelling, burning, or any unusual feeling around the site of your injection, tell your health care professional right away. Talk to your pediatrician regarding the use of this medicine in children. While this drug may be prescribed for selected conditions, precautions do apply. Overdosage: If you think you have taken too much of this medicine contact a poison control center or emergency room at once. NOTE: This medicine is only for you. Do not share this medicine with others. What if I miss a dose? It is important not to miss your dose. Call your doctor or health care professional if you are unable to keep an appointment. What may interact with this medicine? Do not take this medicine with any of the following medications: -itraconazole -mibefradil -voriconazole This medicine may also interact with the following medications: -cyclosporine -erythromycin -fluconazole -ketoconazole -medicines for HIV like delavirdine, efavirenz, nevirapine -medicines for seizures like ethotoin, fosphenotoin, phenytoin -medicines to increase blood counts like filgrastim, pegfilgrastim, sargramostim -other chemotherapy drugs like cisplatin, L-asparaginase, methotrexate, mitomycin, paclitaxel -pegaspargase -vaccines -zalcitabine, ddC Talk to your doctor or health  care professional before taking any of these  medicines: -acetaminophen -aspirin -ibuprofen -ketoprofen -naproxen This list may not describe all possible interactions. Give your health care provider a list of all the medicines, herbs, non-prescription drugs, or dietary supplements you use. Also tell them if you smoke, drink alcohol, or use illegal drugs. Some items may interact with your medicine. What should I watch for while using this medicine? Your condition will be monitored carefully while you are receiving this medicine. You will need important blood work done while you are taking this medicine. This drug may make you feel generally unwell. This is not uncommon, as chemotherapy can affect healthy cells as well as cancer cells. Report any side effects. Continue your course of treatment even though you feel ill unless your doctor tells you to stop. In some cases, you may be given additional medicines to help with side effects. Follow all directions for their use. Call your doctor or health care professional for advice if you get a fever, chills or sore throat, or other symptoms of a cold or flu. Do not treat yourself. Avoid taking products that contain aspirin, acetaminophen, ibuprofen, naproxen, or ketoprofen unless instructed by your doctor. These medicines may hide a fever. Do not become pregnant while taking this medicine. Women should inform their doctor if they wish to become pregnant or think they might be pregnant. There is a potential for serious side effects to an unborn child. Talk to your health care professional or pharmacist for more information. Do not breast-feed an infant while taking this medicine. Men may have a lower sperm count while taking this medicine. Talk to your doctor if you plan to father a child. What side effects may I notice from receiving this medicine? Side effects that you should report to your doctor or health care professional as soon as possible: -allergic reactions like skin rash, itching or hives,  swelling of the face, lips, or tongue -breathing problems -confusion or changes in emotions or moods -constipation -cough -mouth sores -muscle weakness -nausea and vomiting -pain, swelling, redness or irritation at the injection site -pain, tingling, numbness in the hands or feet -problems with balance, talking, walking -seizures -stomach pain -trouble passing urine or change in the amount of urine Side effects that usually do not require medical attention (report to your doctor or health care professional if they continue or are bothersome): -diarrhea -hair loss -jaw pain -loss of appetite This list may not describe all possible side effects. Call your doctor for medical advice about side effects. You may report side effects to FDA at 1-800-FDA-1088. Where should I keep my medicine? This drug is given in a hospital or clinic and will not be stored at home. NOTE: This sheet is a summary. It may not cover all possible information. If you have questions about this medicine, talk to your doctor, pharmacist, or health care provider.  2015, [employer]/Gold Standard. (2007-10-11 17:17:13)  Cyclophosphamide injection What is this medicine? CYCLOPHOSPHAMIDE (sye kloe FOSS fa mide) is a chemotherapy drug. It slows the growth of cancer cells. This medicine is used to treat many types of cancer like lymphoma, myeloma, leukemia, breast cancer, and ovarian cancer, to name a few. This medicine may be used for other purposes; ask your health care provider or pharmacist if you have questions. COMMON BRAND NAME(S): Cytoxan, Neosar What should I tell my health care provider before I take this medicine? They need to know if you have any of these conditions: -blood disorders -history of other  chemotherapy -infection -kidney disease -liver disease -recent or ongoing radiation therapy -tumors in the bone marrow -an unusual or allergic reaction to cyclophosphamide, other chemotherapy, other medicines,  foods, dyes, or preservatives -pregnant or trying to get pregnant -breast-feeding How should I use this medicine? This drug is usually given as an injection into a vein or muscle or by infusion into a vein. It is administered in a hospital or clinic by a specially trained health care professional. Talk to your pediatrician regarding the use of this medicine in children. Special care may be needed. Overdosage: If you think you have taken too much of this medicine contact a poison control center or emergency room at once. NOTE: This medicine is only for you. Do not share this medicine with others. What if I miss a dose? It is important not to miss your dose. Call your doctor or health care professional if you are unable to keep an appointment. What may interact with this medicine? This medicine may interact with the following medications: -amiodarone -amphotericin B -azathioprine -certain antiviral medicines for HIV or AIDS such as protease inhibitors (e.g., indinavir, ritonavir) and zidovudine -certain blood pressure medications such as benazepril, captopril, enalapril, fosinopril, lisinopril, moexipril, monopril, perindopril, quinapril, ramipril, trandolapril -certain cancer medications such as anthracyclines (e.g., daunorubicin, doxorubicin), busulfan, cytarabine, paclitaxel, pentostatin, tamoxifen, trastuzumab -certain diuretics such as chlorothiazide, chlorthalidone, hydrochlorothiazide, indapamide, metolazone -certain medicines that treat or prevent blood clots like warfarin -certain muscle relaxants such as succinylcholine -cyclosporine -etanercept -indomethacin -medicines to increase blood counts like filgrastim, pegfilgrastim, sargramostim -medicines used as general anesthesia -metronidazole -natalizumab This list may not describe all possible interactions. Give your health care provider a list of all the medicines, herbs, non-prescription drugs, or dietary supplements you use.  Also tell them if you smoke, drink alcohol, or use illegal drugs. Some items may interact with your medicine. What should I watch for while using this medicine? Visit your doctor for checks on your progress. This drug may make you feel generally unwell. This is not uncommon, as chemotherapy can affect healthy cells as well as cancer cells. Report any side effects. Continue your course of treatment even though you feel ill unless your doctor tells you to stop. Drink water or other fluids as directed. Urinate often, even at night. In some cases, you may be given additional medicines to help with side effects. Follow all directions for their use. Call your doctor or health care professional for advice if you get a fever, chills or sore throat, or other symptoms of a cold or flu. Do not treat yourself. This drug decreases your body's ability to fight infections. Try to avoid being around people who are sick. This medicine may increase your risk to bruise or bleed. Call your doctor or health care professional if you notice any unusual bleeding. Be careful brushing and flossing your teeth or using a toothpick because you may get an infection or bleed more easily. If you have any dental work done, tell your dentist you are receiving this medicine. You may get drowsy or dizzy. Do not drive, use machinery, or do anything that needs mental alertness until you know how this medicine affects you. Do not become pregnant while taking this medicine or for 1 year after stopping it. Women should inform their doctor if they wish to become pregnant or think they might be pregnant. Men should not father a child while taking this medicine and for 4 months after stopping it. There is a potential for serious side  effects to an unborn child. Talk to your health care professional or pharmacist for more information. Do not breast-feed an infant while taking this medicine. This medicine may interfere with the ability to have a  child. This medicine has caused ovarian failure in some women. This medicine has caused reduced sperm counts in some men. You should talk with your doctor or health care professional if you are concerned about your fertility. If you are going to have surgery, tell your doctor or health care professional that you have taken this medicine. What side effects may I notice from receiving this medicine? Side effects that you should report to your doctor or health care professional as soon as possible: -allergic reactions like skin rash, itching or hives, swelling of the face, lips, or tongue -low blood counts - this medicine may decrease the number of white blood cells, red blood cells and platelets. You may be at increased risk for infections and bleeding. -signs of infection - fever or chills, cough, sore throat, pain or difficulty passing urine -signs of decreased platelets or bleeding - bruising, pinpoint red spots on the skin, black, tarry stools, blood in the urine -signs of decreased red blood cells - unusually weak or tired, fainting spells, lightheadedness -breathing problems -dark urine -dizziness -palpitations -swelling of the ankles, feet, hands -trouble passing urine or change in the amount of urine -weight gain -yellowing of the eyes or skin Side effects that usually do not require medical attention (report to your doctor or health care professional if they continue or are bothersome): -changes in nail or skin color -hair loss -missed menstrual periods -mouth sores -nausea, vomiting This list may not describe all possible side effects. Call your doctor for medical advice about side effects. You may report side effects to FDA at 1-800-FDA-1088. Where should I keep my medicine? This drug is given in a hospital or clinic and will not be stored at home. NOTE: This sheet is a summary. It may not cover all possible information. If you have questions about this medicine, talk to your  doctor, pharmacist, or health care provider.  2015, [employer] Standard. (2011-11-28 16:22:58)   Rituximab injection What is this medicine? RITUXIMAB (ri TUX i mab) is a monoclonal antibody. This medicine changes the way the body's immune system works. It is used commonly to treat non-Hodgkin's lymphoma and other conditions. In cancer cells, this drug targets a specific protein within cancer cells and stops the cancer cells from growing. It is also used to treat rhuematoid arthritis (RA). In RA, this medicine slow the inflammatory process and help reduce joint pain and swelling. This medicine is often used with other cancer or arthritis medications. This medicine may be used for other purposes; ask your health care provider or pharmacist if you have questions. COMMON BRAND NAME(S): Rituxan What should I tell my health care provider before I take this medicine? They need to know if you have any of these conditions: -blood disorders -heart disease -history of hepatitis B -infection (especially a virus infection such as chickenpox, cold sores, or herpes) -irregular heartbeat -kidney disease -lung or breathing disease, like asthma -lupus -an unusual or allergic reaction to rituximab, mouse proteins, other medicines, foods, dyes, or preservatives -pregnant or trying to get pregnant -breast-feeding How should I use this medicine? This medicine is for infusion into a vein. It is administered in a hospital or clinic by a specially trained health care professional. A special MedGuide will be given to you by the pharmacist with  each prescription and refill. Be sure to read this information carefully each time. Talk to your pediatrician regarding the use of this medicine in children. This medicine is not approved for use in children. Overdosage: If you think you have taken too much of this medicine contact a poison control center or emergency room at once. NOTE: This medicine is only for you. Do  not share this medicine with others. What if I miss a dose? It is important not to miss a dose. Call your doctor or health care professional if you are unable to keep an appointment. What may interact with this medicine? -cisplatin -medicines for blood pressure -some other medicines for arthritis -vaccines This list may not describe all possible interactions. Give your health care provider a list of all the medicines, herbs, non-prescription drugs, or dietary supplements you use. Also tell them if you smoke, drink alcohol, or use illegal drugs. Some items may interact with your medicine. What should I watch for while using this medicine? Report any side effects that you notice during your treatment right away, such as changes in your breathing, fever, chills, dizziness or lightheadedness. These effects are more common with the first dose. Visit your prescriber or health care professional for checks on your progress. You will need to have regular blood work. Report any other side effects. The side effects of this medicine can continue after you finish your treatment. Continue your course of treatment even though you feel ill unless your doctor tells you to stop. Call your doctor or health care professional for advice if you get a fever, chills or sore throat, or other symptoms of a cold or flu. Do not treat yourself. This drug decreases your body's ability to fight infections. Try to avoid being around people who are sick. This medicine may increase your risk to bruise or bleed. Call your doctor or health care professional if you notice any unusual bleeding. Be careful brushing and flossing your teeth or using a toothpick because you may get an infection or bleed more easily. If you have any dental work done, tell your dentist you are receiving this medicine. Avoid taking products that contain aspirin, acetaminophen, ibuprofen, naproxen, or ketoprofen unless instructed by your doctor. These medicines  may hide a fever. Do not become pregnant while taking this medicine. Women should inform their doctor if they wish to become pregnant or think they might be pregnant. There is a potential for serious side effects to an unborn child. Talk to your health care professional or pharmacist for more information. Do not breast-feed an infant while taking this medicine. What side effects may I notice from receiving this medicine? Side effects that you should report to your doctor or health care professional as soon as possible: -allergic reactions like skin rash, itching or hives, swelling of the face, lips, or tongue -low blood counts - this medicine may decrease the number of white blood cells, red blood cells and platelets. You may be at increased risk for infections and bleeding. -signs of infection - fever or chills, cough, sore throat, pain or difficulty passing urine -signs of decreased platelets or bleeding - bruising, pinpoint red spots on the skin, black, tarry stools, blood in the urine -signs of decreased red blood cells - unusually weak or tired, fainting spells, lightheadedness -breathing problems -confused, not responsive -chest pain -fast, irregular heartbeat -feeling faint or lightheaded, falls -mouth sores -redness, blistering, peeling or loosening of the skin, including inside the mouth -stomach pain -swelling of  the ankles, feet, or hands -trouble passing urine or change in the amount of urine Side effects that usually do not require medical attention (report to your doctor or other health care professional if they continue or are bothersome): -anxiety -headache -loss of appetite -muscle aches -nausea -night sweats This list may not describe all possible side effects. Call your doctor for medical advice about side effects. You may report side effects to FDA at 1-800-FDA-1088. Where should I keep my medicine? This drug is given in a hospital or clinic and will not be stored at  home. NOTE: This sheet is a summary. It may not cover all possible information. If you have questions about this medicine, talk to your doctor, pharmacist, or health care provider.  2015, Elsevier/Gold Standard. (2007-09-13 14:04:59)

## 2014-01-23 NOTE — Progress Notes (Signed)
Marissa Turner Good blood return from port while administering adriamycin.

## 2014-01-24 ENCOUNTER — Inpatient Hospital Stay (HOSPITAL_COMMUNITY): Payer: Medicare Other

## 2014-01-25 ENCOUNTER — Ambulatory Visit (HOSPITAL_COMMUNITY): Payer: Medicare Other

## 2014-01-25 ENCOUNTER — Encounter (HOSPITAL_BASED_OUTPATIENT_CLINIC_OR_DEPARTMENT_OTHER): Payer: Medicare Other

## 2014-01-25 ENCOUNTER — Encounter (HOSPITAL_COMMUNITY): Payer: Self-pay

## 2014-01-25 ENCOUNTER — Ambulatory Visit (INDEPENDENT_AMBULATORY_CARE_PROVIDER_SITE_OTHER): Payer: Medicare Other | Admitting: *Deleted

## 2014-01-25 DIAGNOSIS — Z5181 Encounter for therapeutic drug level monitoring: Secondary | ICD-10-CM | POA: Diagnosis not present

## 2014-01-25 DIAGNOSIS — I482 Chronic atrial fibrillation, unspecified: Secondary | ICD-10-CM

## 2014-01-25 DIAGNOSIS — Z5189 Encounter for other specified aftercare: Secondary | ICD-10-CM | POA: Diagnosis not present

## 2014-01-25 DIAGNOSIS — Z7901 Long term (current) use of anticoagulants: Secondary | ICD-10-CM | POA: Diagnosis not present

## 2014-01-25 DIAGNOSIS — C8331 Diffuse large B-cell lymphoma, lymph nodes of head, face, and neck: Secondary | ICD-10-CM | POA: Diagnosis not present

## 2014-01-25 DIAGNOSIS — I4891 Unspecified atrial fibrillation: Secondary | ICD-10-CM

## 2014-01-25 DIAGNOSIS — C8580 Other specified types of non-Hodgkin lymphoma, unspecified site: Secondary | ICD-10-CM

## 2014-01-25 LAB — BETA 2 MICROGLOBULIN, SERUM: Beta-2 Microglobulin: 2.47 mg/L — ABNORMAL HIGH (ref <=2.51)

## 2014-01-25 LAB — POCT INR: INR: 2.4

## 2014-01-25 MED ORDER — PEGFILGRASTIM INJECTION 6 MG/0.6ML ~~LOC~~
6.0000 mg | PREFILLED_SYRINGE | Freq: Once | SUBCUTANEOUS | Status: AC
  Administered 2014-01-25: 6 mg via SUBCUTANEOUS

## 2014-01-25 MED ORDER — PEGFILGRASTIM INJECTION 6 MG/0.6ML ~~LOC~~
PREFILLED_SYRINGE | SUBCUTANEOUS | Status: AC
  Filled 2014-01-25: qty 0.6

## 2014-01-25 NOTE — Patient Instructions (Signed)
[facility] Discharge Instructions  RECOMMENDATIONS MADE BY THE CONSULTANT AND ANY TEST RESULTS WILL BE SENT TO YOUR REFERRING PHYSICIAN.  Today you received Neulasta.  Return as scheduled for treatment and office visit. Call the clinic with any questions or concerns.   Thank you for choosing [facility] to provide your oncology and hematology care.  To afford each patient quality time with our providers, please arrive at least 15 minutes before your scheduled appointment time.  With your help, our goal is to use those 15 minutes to complete the necessary work-up to ensure our physicians have the information they need to help with your evaluation and healthcare recommendations.    Effective January 1st, 2014, we ask that you re-schedule your appointment with our physicians should you arrive 10 or more minutes late for your appointment.  We strive to give you quality time with our providers, and arriving late affects you and other patients whose appointments are after yours.    Again, thank you for choosing [facility].  Our hope is that these requests will decrease the amount of time that you wait before being seen by our physicians.       _____________________________________________________________  Should you have questions after your visit to [facility], please contact our office at [phone] between the hours of 8:30 a.m. and 4:30 p.m.  Voicemails left after 4:30 p.m. will not be returned until the following business day.  For prescription refill requests, have your pharmacy contact our office with your prescription refill request.    _______________________________________________________________  We hope that we have given you very good care.  You may receive a patient satisfaction survey in the mail, please complete it and return it as soon as possible.  We value your  feedback!  _______________________________________________________________  Have you asked about our STAR program?  STAR stands for Survivorship Training and Rehabilitation, and this is a nationally recognized cancer care program that focuses on survivorship and rehabilitation.  Cancer and cancer treatments may cause problems, such as, pain, making you feel tired and keeping you from doing the things that you need or want to do. Cancer rehabilitation can help. Our goal is to reduce these troubling effects and help you have the best quality of life possible.  You may receive a survey from a nurse that asks questions about your current state of health.  Based on the survey results, all eligible patients will be referred to the [facility] program for an evaluation so we can better serve you!  A frequently asked questions sheet is available upon request.

## 2014-01-25 NOTE — Progress Notes (Signed)
1146:  Marissa Turner presents today for injection per the provider's orders.  Neulasta administration without incident; see MAR for injection details.  Patient tolerated procedure well and without incident.  No questions or complaints noted at this time.

## 2014-02-04 ENCOUNTER — Encounter (HOSPITAL_COMMUNITY): Payer: Self-pay | Admitting: Hematology & Oncology

## 2014-02-04 NOTE — Assessment & Plan Note (Signed)
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 She did not have bone marrow biopsy at the time of her diagnosis. Based upon imaging she was felt to have early stage disease, stage IE, and started on Treanda and Rituxan. It was spelled additional imaging after 2 cycles of Treanda and Rituxan suggested possible progression; therefore, she was changed to R CHOP. She is here today for her second cycle of R CHOP and has done amazingly well with therapy. I have recommended repeat imaging with PET. I will see her back after PET imaging and plan most likely will be for 3 cycles of R CHOP followed by radiation. We will discuss the fact she did not have a bone marrow biopsy and see if there is any need for an LP. I will see her back as detailed.

## 2014-02-06 ENCOUNTER — Encounter (HOSPITAL_COMMUNITY): Payer: Self-pay

## 2014-02-06 ENCOUNTER — Ambulatory Visit (HOSPITAL_COMMUNITY)
Admission: RE | Admit: 2014-02-06 | Discharge: 2014-02-06 | Disposition: A | Discharge status: Home / Self Care | Payer: Medicare Other | Source: Ambulatory Visit | Attending: Oncology | Admitting: Oncology

## 2014-02-06 DIAGNOSIS — Z79899 Other long term (current) drug therapy: Secondary | ICD-10-CM | POA: Insufficient documentation

## 2014-02-06 DIAGNOSIS — C8510 Unspecified B-cell lymphoma, unspecified site: Secondary | ICD-10-CM | POA: Insufficient documentation

## 2014-02-06 DIAGNOSIS — C8580 Other specified types of non-Hodgkin lymphoma, unspecified site: Secondary | ICD-10-CM

## 2014-02-06 DIAGNOSIS — R918 Other nonspecific abnormal finding of lung field: Secondary | ICD-10-CM | POA: Diagnosis not present

## 2014-02-06 DIAGNOSIS — C8590 Non-Hodgkin lymphoma, unspecified, unspecified site: Secondary | ICD-10-CM | POA: Diagnosis not present

## 2014-02-06 LAB — GLUCOSE, CAPILLARY: Glucose-Capillary: 98 mg/dL (ref 70–99)

## 2014-02-06 MED ORDER — FLUDEOXYGLUCOSE F - 18 (FDG) INJECTION
6.0000 | Freq: Once | INTRAVENOUS | Status: AC | PRN
  Administered 2014-02-06: 6 via INTRAVENOUS

## 2014-02-07 DIAGNOSIS — N921 Excessive and frequent menstruation with irregular cycle: Secondary | ICD-10-CM | POA: Diagnosis not present

## 2014-02-07 DIAGNOSIS — F411 Generalized anxiety disorder: Secondary | ICD-10-CM | POA: Diagnosis not present

## 2014-02-07 DIAGNOSIS — M159 Polyosteoarthritis, unspecified: Secondary | ICD-10-CM | POA: Diagnosis not present

## 2014-02-10 ENCOUNTER — Encounter (HOSPITAL_COMMUNITY): Payer: Self-pay | Admitting: Hematology & Oncology

## 2014-02-10 ENCOUNTER — Encounter (HOSPITAL_COMMUNITY): Payer: Medicare Other | Attending: Hematology and Oncology | Admitting: Hematology & Oncology

## 2014-02-10 VITALS — BP 117/74 | HR 60 | Temp 98.0°F | Resp 18 | Wt 116.5 lb

## 2014-02-10 DIAGNOSIS — J449 Chronic obstructive pulmonary disease, unspecified: Secondary | ICD-10-CM | POA: Insufficient documentation

## 2014-02-10 DIAGNOSIS — I482 Chronic atrial fibrillation: Secondary | ICD-10-CM | POA: Insufficient documentation

## 2014-02-10 DIAGNOSIS — Z9079 Acquired absence of other genital organ(s): Secondary | ICD-10-CM | POA: Insufficient documentation

## 2014-02-10 DIAGNOSIS — Z7901 Long term (current) use of anticoagulants: Secondary | ICD-10-CM | POA: Insufficient documentation

## 2014-02-10 DIAGNOSIS — Z23 Encounter for immunization: Secondary | ICD-10-CM | POA: Diagnosis not present

## 2014-02-10 DIAGNOSIS — Z90710 Acquired absence of both cervix and uterus: Secondary | ICD-10-CM | POA: Insufficient documentation

## 2014-02-10 DIAGNOSIS — I10 Essential (primary) hypertension: Secondary | ICD-10-CM | POA: Insufficient documentation

## 2014-02-10 DIAGNOSIS — C8330 Diffuse large B-cell lymphoma, unspecified site: Secondary | ICD-10-CM

## 2014-02-10 DIAGNOSIS — Z980 Intestinal bypass and anastomosis status: Secondary | ICD-10-CM | POA: Insufficient documentation

## 2014-02-10 DIAGNOSIS — Z9089 Acquired absence of other organs: Secondary | ICD-10-CM | POA: Insufficient documentation

## 2014-02-10 DIAGNOSIS — M47817 Spondylosis without myelopathy or radiculopathy, lumbosacral region: Secondary | ICD-10-CM | POA: Insufficient documentation

## 2014-02-10 DIAGNOSIS — F329 Major depressive disorder, single episode, unspecified: Secondary | ICD-10-CM | POA: Insufficient documentation

## 2014-02-10 DIAGNOSIS — F17210 Nicotine dependence, cigarettes, uncomplicated: Secondary | ICD-10-CM | POA: Insufficient documentation

## 2014-02-10 DIAGNOSIS — C8580 Other specified types of non-Hodgkin lymphoma, unspecified site: Secondary | ICD-10-CM

## 2014-02-10 DIAGNOSIS — C8519 Unspecified B-cell lymphoma, extranodal and solid organ sites: Secondary | ICD-10-CM | POA: Insufficient documentation

## 2014-02-10 MED ORDER — INFLUENZA VAC SPLIT QUAD 0.5 ML IM SUSY
0.5000 mL | PREFILLED_SYRINGE | Freq: Once | INTRAMUSCULAR | Status: AC
  Administered 2014-02-10: 0.5 mL via INTRAMUSCULAR
  Filled 2014-02-10: qty 0.5

## 2014-02-10 NOTE — Progress Notes (Signed)
Marissa Turner presents today for injection per the provider's orders.  Fluvarix administration without incident; see MAR for injection details.  Patient tolerated procedure well and without incident.  No questions or complaints noted at this time.

## 2014-02-10 NOTE — Progress Notes (Signed)
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  INTERVAL HISTORY: Marissa Turner 74 y.o. female returns for additional therapy of her NHL. She is here today to review her recent PET/CT. She is due for her final cycle of R-CHOP on Monday. She is anticipating a referral for XRT.  She continues to do well.  She cleans everyday. She takes care of her grandchildren. She has no complaints.   MEDICAL HISTORY: Past Medical History  Diagnosis Date  . Atrial fibrillation, chronic 2004    left atrial thrombus in 2004; near syncope in 2008; echocardiogram in 2008-normal EF, mild left atrial enlargement, no valvular abnormalities, lipomatous hypertrophy; no aortic atheroma on TEE in 2004  . Chronic anticoagulation 2004    warfarin  . Tobacco abuse     40 pack years; quit attempt in 2012  . DJD (degenerative joint disease), lumbosacral     Also hip  . Peptic ulcer disease     s/p [place] II  . Depression   . Fracture of wrist     Left  . Hypertension     Normal carotid ultrasound in 2008  . PONV (postoperative nausea and vomiting)   . Dysrhythmia     chronic AFib  . Cancer     has SPINAL STENOSIS; DJD (degenerative joint disease), lumbosacral; Atrial fibrillation, chronic; Chronic anticoagulation; Tobacco abuse; Peptic ulcer disease; Hypertension; Encounter for therapeutic drug monitoring; Lymphoma malignant, large cell; and Iron deficiency on her problem list.      Lymphoma malignant, large cell   09/15/2013 Initial Diagnosis Diagnosis Lymph node, biopsy, cervical left neck - DIFFUSE LARGE B CELL LYMPHOMA.   10/06/2013 PET scan Hypermetabolic thickening within the right vallecular consists with primary head and neck cancer.  Single enlarged hypermetabolic right level IIa lymph node consistent local nodal  metastasis.   10/31/2013 - 11/29/2013 Chemotherapy Bendamustine/Rituxan x 2 cycles   12/19/2013 Progression PET- Progression of presumed vallecular lymphoma as evidenced by increased hypermetabolism.   12/20/2013 Treatment Plan Change Per Dr. Gari Crown, patient has a CD10 negative, non-germinal center non-Hodgkin's lymphoma.  Patient may be a candidate for additional of Revlimid to treatment plan.   12/28/2013 Echocardiogram MUGA- Normal left ventricular wall motion with calculated ejection fraction of 60%.   01/02/2014 -  Chemotherapy R-CHOP with Neulasta support.     is allergic to diphenhydramine hcl; estrogens; penicillins; and codeine.  We administered Influenza vac split quadrivalent PF.  SURGICAL HISTORY: Past Surgical History  Procedure Laterality Date  . Cholecystectomy  2004    [place] II; splenectomy; incidental appendectomy  . Abdominal hysterectomy      w/o oophorectomy  . Cesarean section    . Tonsillectomy    . Colonoscopy  1980s    patient denies ever having undergone colonoscopy as of 2014  . Appendectomy    . Splenectomy, total    . Lymph node biopsy Left 09/15/2013    Procedure: LEFT NECK CERVICAL NODE BIOPSY;  Surgeon: Scherry Ran, MD;  Location: [facility];  Service: General;  Laterality: Left;  . Portacath placement Right 10/21/2013    Procedure: ATTEMPTED INSERTION PORT-A-CATH;  Surgeon: Scherry Ran, MD;  Location: [facility];  Service: General;  Laterality: Right;    SOCIAL  HISTORY: History   Social History  . Marital Status: Married    Spouse Name: N/A    Number of Children: 2  . Years of Education: N/A   Occupational History  . Housecleaning    Social History Main Topics  . Smoking status: Current Every Day Smoker -- 0.25 packs/day for 55 years    Types: Cigarettes  . Smokeless tobacco: Not on file     Comment: Quit attempt in 2012  . Alcohol Use: No  . Drug Use: No  . Sexual Activity: Yes     Birth Control/ Protection: Surgical   Other Topics Concern  . Not on file   Social History Narrative   Married with one child and one adopted child, 1 stillbirth, and one deceased child at age 22 due to illness   No regular exercise    FAMILY HISTORY: Family History  Problem Relation Age of Onset  . Heart attack Mother     also brother  . Breast cancer Sister   . Heart attack Brother   . Breast cancer Sister     x2  . Cirrhosis Sister   . Cancer Brother     Gastric carcinoma; also father    Review of Systems  Constitutional: Negative for fever, chills, weight loss and malaise/fatigue.  HENT: Negative for congestion, hearing loss, nosebleeds, sore throat and tinnitus.   Eyes: Negative for blurred vision, double vision, pain and discharge.  Respiratory: Negative for cough, hemoptysis, sputum production, shortness of breath and wheezing.   Cardiovascular: Negative for chest pain, palpitations, claudication, leg swelling and PND.  Gastrointestinal: Negative for heartburn, nausea, vomiting, abdominal pain, diarrhea, constipation, blood in stool and melena.  Genitourinary: Negative for dysuria, urgency, frequency and hematuria.  Musculoskeletal: Negative for myalgias, joint pain and falls.  Skin: Negative for itching and rash.  Neurological: Negative for dizziness, tingling, tremors, sensory change, speech change, focal weakness, seizures, loss of consciousness, weakness and headaches.  Endo/Heme/Allergies: Does not bruise/bleed easily.  Psychiatric/Behavioral: Negative for depression, suicidal ideas, memory loss and substance abuse. The patient is not nervous/anxious and does not have insomnia.     PHYSICAL EXAMINATION  ECOG PERFORMANCE STATUS: 0 - Asymptomatic  Filed Vitals:   02/10/14 1259  BP: 117/74  Pulse: 60  Temp: 98 F (36.7 C)  Resp: 18    Physical Exam  Constitutional: She is oriented to person, place, and time and well-developed, well-nourished, and in no  distress.  Alopecia, wearing a hair prosthesis and a hat  HENT:  Head: Normocephalic and atraumatic.  Mouth/Throat: Oropharynx is clear and moist. No oropharyngeal exudate.  Eyes: Conjunctivae and EOM are normal. Pupils are equal, round, and reactive to light. Right eye exhibits no discharge. Left eye exhibits no discharge.  Neck: Normal range of motion. Neck supple. No JVD present. No thyromegaly present.  Cardiovascular: Normal rate, regular rhythm and normal heart sounds.   No murmur heard. Pulmonary/Chest: Effort normal and breath sounds normal. No respiratory distress. She has no wheezes. She has no rales. She exhibits no tenderness.  Abdominal: Soft. Bowel sounds are normal. She exhibits no distension and no mass. There is no tenderness. There is no rebound and no guarding.  Musculoskeletal: Normal range of motion. She exhibits no edema.  Lymphadenopathy:    She has no cervical adenopathy.  Neurological: She is alert and oriented to person, place, and time. She displays normal reflexes. No cranial nerve deficit. Coordination normal.  Skin: Skin is warm and dry. No rash noted. No  erythema. No pallor.  Psychiatric: Mood, memory, affect and judgment normal.    LABORATORY DATA:  CBC    Component Value Date/Time   WBC 6.7 02/13/2014 0900   RBC 3.47* 02/13/2014 0900   RBC 4.84 10/10/2013 1502   HGB 11.2* 02/13/2014 0900   HCT 33.7* 02/13/2014 0900   PLT 353 02/13/2014 0900   MCV 97.1 02/13/2014 0900   MCH 32.3 02/13/2014 0900   MCHC 33.2 02/13/2014 0900   RDW 18.8* 02/13/2014 0900   LYMPHSABS 1.3 02/13/2014 0900   MONOABS 0.9 02/13/2014 0900   EOSABS 0.4 02/13/2014 0900   BASOSABS 0.1 02/13/2014 0900   CMP     Component Value Date/Time   NA 136 02/13/2014 0900   K 4.4 02/13/2014 0900   CL 102 02/13/2014 0900   CO2 28 02/13/2014 0900   GLUCOSE 96 02/13/2014 0900   BUN 10 02/13/2014 0900   CREATININE 0.62 02/13/2014 0900   CREATININE 0.65 06/30/2013 0840   CALCIUM 8.5  02/13/2014 0900   PROT 6.3 02/13/2014 0900   ALBUMIN 3.4* 02/13/2014 0900   AST 31 02/13/2014 0900   ALT 27 02/13/2014 0900   ALKPHOS 65 02/13/2014 0900   BILITOT 0.3 02/13/2014 0900   GFRNONAA 87* 02/13/2014 0900   GFRAA >90 02/13/2014 0900     PATHOLOGY: Interpretation Tissue-Flow Cytometry - PREDOMINANCE OF B CELLS. - SEE NOTE. Diagnosis Comment: Analysis of the lymphoid population shows predominance of B cells displaying pan B cell antigens including CD20 with lack of CD5 or CD10. B cells display lambda light chain excess. The overall features are worrisome for a B cell lymphoproliferative process.  Diagnosis Lymph node, biopsy, cervical left neck - DIFFUSE LARGE B CELL LYMPHOMA. - SEE ONCOLOGY TABLE.  CLINICAL DATA: Subsequent treatment strategy for non-Hodgkin' s lymphoma.  EXAM: NUCLEAR MEDICINE PET SKULL BASE TO THIGH  IMPRESSION: 1. Resolution of the previously enlarged right-sided station 2 lymph node in the neck with resolution of the abnormal right vallecular activity in the neck. Currently no specific in findings of residual lymphoma. 2. Diffuse slightly heterogeneous marrow activity, probably incidental or due to marrow reactivation. 3. Bilateral upper lobe pulmonary nodules, larger on the left. The left-sided nodule has been reported back through 2004 and is not hypermetabolic, and likely benign. 4. Ancillary findings include diffuse hepatic steatosis, atherosclerosis, bilateral renal hips, severe arthropathy of the left hip, and emphysema.   Electronically Signed  By: Sherryl Barters M.D.  On: 02/06/2014 12:22   ASSESSMENT and THERAPY PLAN:    Lymphoma malignant, large cell 74 year old female with stage I E diffuse large B-cell lymphoma. She was treated originally with Treanda Rituxan and was felt to have progression. She was changed to R CHOP. Based on PET imaging has had a complete response after 2 cycles. She will complete cycle 3 on  Monday and I have recommended referral to radiation for completion of therapy.  She did not undergo a bone marrow biopsy as part of her initial staging, as per NCCN and guidelines. Because of this I have recommended at least an LP with flow cytometry and cytology. I discussed the rationale of this with the patient and she does agree. I will see her back in several weeks sooner if needed. She knows to call any time during the week with any problems or concerns.    All questions were answered. The patient knows to call the clinic with any problems, questions or concerns. We can certainly see the patient much sooner if necessary. Micael Barb  Kristen 02/14/2014

## 2014-02-10 NOTE — Patient Instructions (Signed)
[place] at [facility]  Discharge Instructions:  You will get your last chemotherapy treatment on Monday. You will be referred to Radiation in [place]. You will receive your flu shot today I will see you back in 2 weeks.  _______________________________________________________________  Thank you for choosing [place] at [facility] to provide your oncology and hematology care.  To afford each patient quality time with our providers, please arrive at least 15 minutes before your scheduled appointment.  You need to re-schedule your appointment if you arrive 10 or more minutes late.  We strive to give you quality time with our providers, and arriving late affects you and other patients whose appointments are after yours.  Also, if you no show three or more times for appointments you may be dismissed from the clinic.  Again, thank you for choosing [facility] at [place] hope is that these requests will allow you access to exceptional care and in a timely manner. _______________________________________________________________  If you have questions after your visit, please contact our office at [phone] between the hours of 8:30 a.m. and 5:00 p.m. Voicemails left after 4:30 p.m. will not be returned until the following business day. _______________________________________________________________  For prescription refill requests, have your pharmacy contact our office. _______________________________________________________________  Recommendations made by the consultant and any test results will be sent to your referring physician. _______________________________________________________________

## 2014-02-12 ENCOUNTER — Other Ambulatory Visit (HOSPITAL_COMMUNITY): Payer: Self-pay | Admitting: Hematology & Oncology

## 2014-02-13 ENCOUNTER — Ambulatory Visit (INDEPENDENT_AMBULATORY_CARE_PROVIDER_SITE_OTHER): Payer: Medicare Other | Admitting: *Deleted

## 2014-02-13 ENCOUNTER — Ambulatory Visit (HOSPITAL_COMMUNITY): Payer: Medicare Other | Admitting: Hematology & Oncology

## 2014-02-13 ENCOUNTER — Encounter (HOSPITAL_COMMUNITY): Payer: Self-pay

## 2014-02-13 ENCOUNTER — Encounter (HOSPITAL_BASED_OUTPATIENT_CLINIC_OR_DEPARTMENT_OTHER): Payer: Medicare Other

## 2014-02-13 DIAGNOSIS — Z7901 Long term (current) use of anticoagulants: Secondary | ICD-10-CM

## 2014-02-13 DIAGNOSIS — Z5181 Encounter for therapeutic drug level monitoring: Secondary | ICD-10-CM

## 2014-02-13 DIAGNOSIS — I482 Chronic atrial fibrillation, unspecified: Secondary | ICD-10-CM

## 2014-02-13 DIAGNOSIS — F17210 Nicotine dependence, cigarettes, uncomplicated: Secondary | ICD-10-CM | POA: Diagnosis not present

## 2014-02-13 DIAGNOSIS — I10 Essential (primary) hypertension: Secondary | ICD-10-CM | POA: Diagnosis not present

## 2014-02-13 DIAGNOSIS — J449 Chronic obstructive pulmonary disease, unspecified: Secondary | ICD-10-CM | POA: Diagnosis not present

## 2014-02-13 DIAGNOSIS — Z980 Intestinal bypass and anastomosis status: Secondary | ICD-10-CM | POA: Diagnosis not present

## 2014-02-13 DIAGNOSIS — F329 Major depressive disorder, single episode, unspecified: Secondary | ICD-10-CM | POA: Diagnosis not present

## 2014-02-13 DIAGNOSIS — C8580 Other specified types of non-Hodgkin lymphoma, unspecified site: Secondary | ICD-10-CM

## 2014-02-13 DIAGNOSIS — Z5111 Encounter for antineoplastic chemotherapy: Secondary | ICD-10-CM

## 2014-02-13 DIAGNOSIS — I4891 Unspecified atrial fibrillation: Secondary | ICD-10-CM | POA: Diagnosis not present

## 2014-02-13 DIAGNOSIS — Z5112 Encounter for antineoplastic immunotherapy: Secondary | ICD-10-CM | POA: Diagnosis not present

## 2014-02-13 DIAGNOSIS — C8519 Unspecified B-cell lymphoma, extranodal and solid organ sites: Secondary | ICD-10-CM | POA: Diagnosis not present

## 2014-02-13 DIAGNOSIS — Z90710 Acquired absence of both cervix and uterus: Secondary | ICD-10-CM | POA: Diagnosis not present

## 2014-02-13 DIAGNOSIS — C8331 Diffuse large B-cell lymphoma, lymph nodes of head, face, and neck: Secondary | ICD-10-CM

## 2014-02-13 DIAGNOSIS — Z9079 Acquired absence of other genital organ(s): Secondary | ICD-10-CM | POA: Diagnosis not present

## 2014-02-13 DIAGNOSIS — Z9089 Acquired absence of other organs: Secondary | ICD-10-CM | POA: Diagnosis not present

## 2014-02-13 DIAGNOSIS — M47817 Spondylosis without myelopathy or radiculopathy, lumbosacral region: Secondary | ICD-10-CM | POA: Diagnosis not present

## 2014-02-13 LAB — CBC WITH DIFFERENTIAL/PLATELET
BASOS PCT: 1 % (ref 0–1)
Basophils Absolute: 0.1 10*3/uL (ref 0.0–0.1)
Eosinophils Absolute: 0.4 10*3/uL (ref 0.0–0.7)
Eosinophils Relative: 6 % — ABNORMAL HIGH (ref 0–5)
HCT: 33.7 % — ABNORMAL LOW (ref 36.0–46.0)
HEMOGLOBIN: 11.2 g/dL — AB (ref 12.0–15.0)
LYMPHS ABS: 1.3 10*3/uL (ref 0.7–4.0)
Lymphocytes Relative: 19 % (ref 12–46)
MCH: 32.3 pg (ref 26.0–34.0)
MCHC: 33.2 g/dL (ref 30.0–36.0)
MCV: 97.1 fL (ref 78.0–100.0)
MONOS PCT: 13 % — AB (ref 3–12)
Monocytes Absolute: 0.9 10*3/uL (ref 0.1–1.0)
Neutro Abs: 4.2 10*3/uL (ref 1.7–7.7)
Neutrophils Relative %: 61 % (ref 43–77)
PLATELETS: 353 10*3/uL (ref 150–400)
RBC: 3.47 MIL/uL — ABNORMAL LOW (ref 3.87–5.11)
RDW: 18.8 % — ABNORMAL HIGH (ref 11.5–15.5)
WBC: 6.7 10*3/uL (ref 4.0–10.5)

## 2014-02-13 LAB — COMPREHENSIVE METABOLIC PANEL
ALBUMIN: 3.4 g/dL — AB (ref 3.5–5.2)
ALK PHOS: 65 U/L (ref 39–117)
ALT: 27 U/L (ref 0–35)
ANION GAP: 6 (ref 5–15)
AST: 31 U/L (ref 0–37)
BUN: 10 mg/dL (ref 6–23)
CHLORIDE: 102 meq/L (ref 96–112)
CO2: 28 mmol/L (ref 19–32)
CREATININE: 0.62 mg/dL (ref 0.50–1.10)
Calcium: 8.5 mg/dL (ref 8.4–10.5)
GFR calc Af Amer: >90 mL/min (ref >90)
GFR, EST NON AFRICAN AMERICAN: 87 mL/min — AB (ref >90)
Glucose, Bld: 96 mg/dL (ref 70–99)
Potassium: 4.4 mmol/L (ref 3.5–5.1)
SODIUM: 136 mmol/L (ref 135–145)
Total Bilirubin: 0.3 mg/dL (ref 0.3–1.2)
Total Protein: 6.3 g/dL (ref 6.0–8.3)

## 2014-02-13 LAB — LACTATE DEHYDROGENASE: LDH: 154 U/L (ref 94–250)

## 2014-02-13 LAB — SEDIMENTATION RATE: SED RATE: 35 mm/h — AB (ref 0–22)

## 2014-02-13 LAB — POCT INR: INR: 2.1

## 2014-02-13 MED ORDER — SODIUM CHLORIDE 0.9 % IV SOLN
Freq: Once | INTRAVENOUS | Status: AC
  Administered 2014-02-13: 16 mg via INTRAVENOUS
  Filled 2014-02-13: qty 8

## 2014-02-13 MED ORDER — SODIUM CHLORIDE 0.9 % IJ SOLN: 10.0000 mL | INTRAMUSCULAR | Status: DC | PRN

## 2014-02-13 MED ORDER — HEPARIN SOD (PORK) LOCK FLUSH 100 UNIT/ML IV SOLN
500.0000 [IU] | Freq: Once | INTRAVENOUS | Status: AC | PRN
  Administered 2014-02-13: 500 [IU]

## 2014-02-13 MED ORDER — ACETAMINOPHEN 325 MG PO TABS
650.0000 mg | ORAL_TABLET | Freq: Once | ORAL | Status: AC
  Administered 2014-02-13: 650 mg via ORAL
  Filled 2014-02-13: qty 2

## 2014-02-13 MED ORDER — SODIUM CHLORIDE 0.9 % IV SOLN
375.0000 mg/m2 | Freq: Once | INTRAVENOUS | Status: AC
  Administered 2014-02-13: 600 mg via INTRAVENOUS
  Filled 2014-02-13: qty 60

## 2014-02-13 MED ORDER — SODIUM CHLORIDE 0.9 % IV SOLN
750.0000 mg/m2 | Freq: Once | INTRAVENOUS | Status: AC
  Administered 2014-02-13: 1140 mg via INTRAVENOUS
  Filled 2014-02-13: qty 57

## 2014-02-13 MED ORDER — HEPARIN SOD (PORK) LOCK FLUSH 100 UNIT/ML IV SOLN
INTRAVENOUS | Status: AC
  Filled 2014-02-13: qty 5

## 2014-02-13 MED ORDER — LORATADINE 10 MG PO TABS
10.0000 mg | ORAL_TABLET | Freq: Every day | ORAL | Status: DC
  Administered 2014-02-13: 10 mg via ORAL
  Filled 2014-02-13: qty 1

## 2014-02-13 MED ORDER — SODIUM CHLORIDE 0.9 % IV SOLN
2.0000 mg | Freq: Once | INTRAVENOUS | Status: AC
  Administered 2014-02-13: 2 mg via INTRAVENOUS
  Filled 2014-02-13: qty 2

## 2014-02-13 MED ORDER — DOXORUBICIN HCL CHEMO IV INJECTION 2 MG/ML
50.0000 mg/m2 | Freq: Once | INTRAVENOUS | Status: AC
  Administered 2014-02-13: 76 mg via INTRAVENOUS
  Filled 2014-02-13: qty 38

## 2014-02-13 MED ORDER — SODIUM CHLORIDE 0.9 % IV SOLN
Freq: Once | INTRAVENOUS | Status: AC
  Administered 2014-02-13: 10:00:00 via INTRAVENOUS

## 2014-02-13 NOTE — Patient Instructions (Signed)
[facility] Discharge Instructions for Patients Receiving Chemotherapy  Today you received the following chemotherapy agents adriamycin,vincristine,cytoxan,rituxan Today is your last today of chemotherapy.  Call the clinic if you have any questions or concerns.  Stop taking coumadin on 02/24/2014 before you have your bone marrow biopsy.  To help prevent nausea and vomiting after your treatment, we encourage you to take your nausea medication    If you develop nausea and vomiting that is not controlled by your nausea medication, call the clinic. If it is after clinic hours your family physician or the after hours number for the clinic or go to the Emergency Department.   BELOW ARE SYMPTOMS THAT SHOULD BE REPORTED IMMEDIATELY:  *FEVER GREATER THAN 101.0 F  *CHILLS WITH OR WITHOUT FEVER  NAUSEA AND VOMITING THAT IS NOT CONTROLLED WITH YOUR NAUSEA MEDICATION  *UNUSUAL SHORTNESS OF BREATH  *UNUSUAL BRUISING OR BLEEDING  TENDERNESS IN MOUTH AND THROAT WITH OR WITHOUT PRESENCE OF ULCERS  *URINARY PROBLEMS  *BOWEL PROBLEMS  UNUSUAL RASH Items with * indicate a potential emergency and should be followed up as soon as possible.  One of the nurses will contact you 24 hours after your treatment. Please let the nurse know about any problems that you may have experienced. Feel free to call the clinic you have any questions or concerns. The clinic phone number is [phone].   I have been informed and understand all the instructions given to me. I know to contact the clinic, my physician, or go to the Emergency Department if any problems should occur. I do not have any questions at this time, but understand that I may call the clinic during office hours or the Patient Navigator at [phone] should I have any questions or need assistance in obtaining follow up care.    Doxorubicin injection What is this medicine? DOXORUBICIN (dox oh ROO bi sin) is a chemotherapy drug. It  is used to treat many kinds of cancer like Hodgkin's disease, leukemia, non-Hodgkin's lymphoma, neuroblastoma, sarcoma, and Wilms' tumor. It is also used to treat bladder cancer, breast cancer, lung cancer, ovarian cancer, stomach cancer, and thyroid cancer. This medicine may be used for other purposes; ask your health care provider or pharmacist if you have questions. COMMON BRAND NAME(S): Adriamycin, Adriamycin PFS, Adriamycin RDF, Rubex What should I tell my health care provider before I take this medicine? They need to know if you have any of these conditions: -blood disorders -heart disease, recent heart attack -infection (especially a virus infection such as chickenpox, cold sores, or herpes) -irregular heartbeat -liver disease -recent or ongoing radiation therapy -an unusual or allergic reaction to doxorubicin, other chemotherapy agents, other medicines, foods, dyes, or preservatives -pregnant or trying to get pregnant -breast-feeding How should I use this medicine? This drug is given as an infusion into a vein. It is administered in a hospital or clinic by a specially trained health care professional. If you have pain, swelling, burning or any unusual feeling around the site of your injection, tell your health care professional right away. Talk to your pediatrician regarding the use of this medicine in children. Special care may be needed. Overdosage: If you think you have taken too much of this medicine contact a poison control center or emergency room at once. NOTE: This medicine is only for you. Do not share this medicine with others. What if I miss a dose? It is important not to miss your dose. Call your doctor or health care professional  if you are unable to keep an appointment. What may interact with this medicine? Do not take this medicine with any of the following medications: -cisapride -droperidol -halofantrine -pimozide -zidovudine This medicine may also interact with  the following medications: -chloroquine -chlorpromazine -clarithromycin -cyclophosphamide -cyclosporine -erythromycin -medicines for depression, anxiety, or psychotic disturbances -medicines for irregular heart beat like amiodarone, bepridil, dofetilide, encainide, flecainide, propafenone, quinidine -medicines for seizures like ethotoin, fosphenytoin, phenytoin -medicines for nausea, vomiting like dolasetron, ondansetron, palonosetron -medicines to increase blood counts like filgrastim, pegfilgrastim, sargramostim -methadone -methotrexate -pentamidine -progesterone -vaccines -verapamil Talk to your doctor or health care professional before taking any of these medicines: -acetaminophen -aspirin -ibuprofen -ketoprofen -naproxen This list may not describe all possible interactions. Give your health care provider a list of all the medicines, herbs, non-prescription drugs, or dietary supplements you use. Also tell them if you smoke, drink alcohol, or use illegal drugs. Some items may interact with your medicine. What should I watch for while using this medicine? Your condition will be monitored carefully while you are receiving this medicine. You will need important blood work done while you are taking this medicine. This drug may make you feel generally unwell. This is not uncommon, as chemotherapy can affect healthy cells as well as cancer cells. Report any side effects. Continue your course of treatment even though you feel ill unless your doctor tells you to stop. Your urine may turn red for a few days after your dose. This is not blood. If your urine is dark or brown, call your doctor. In some cases, you may be given additional medicines to help with side effects. Follow all directions for their use. Call your doctor or health care professional for advice if you get a fever, chills or sore throat, or other symptoms of a cold or flu. Do not treat yourself. This drug decreases your body's  ability to fight infections. Try to avoid being around people who are sick. This medicine may increase your risk to bruise or bleed. Call your doctor or health care professional if you notice any unusual bleeding. Be careful brushing and flossing your teeth or using a toothpick because you may get an infection or bleed more easily. If you have any dental work done, tell your dentist you are receiving this medicine. Avoid taking products that contain aspirin, acetaminophen, ibuprofen, naproxen, or ketoprofen unless instructed by your doctor. These medicines may hide a fever. Men and women of childbearing age should use effective birth control methods while using taking this medicine. Do not become pregnant while taking this medicine. There is a potential for serious side effects to an unborn child. Talk to your health care professional or pharmacist for more information. Do not breast-feed an infant while taking this medicine. Do not let others touch your urine or other body fluids for 5 days after each treatment with this medicine. Caregivers should wear latex gloves to avoid touching body fluids during this time. There is a maximum amount of this medicine you should receive throughout your life. The amount depends on the medical condition being treated and your overall health. Your doctor will watch how much of this medicine you receive in your lifetime. Tell your doctor if you have taken this medicine before. What side effects may I notice from receiving this medicine? Side effects that you should report to your doctor or health care professional as soon as possible: -allergic reactions like skin rash, itching or hives, swelling of the face, lips, or tongue -low blood  counts - this medicine may decrease the number of white blood cells, red blood cells and platelets. You may be at increased risk for infections and bleeding. -signs of infection - fever or chills, cough, sore throat, pain or difficulty  passing urine -signs of decreased platelets or bleeding - bruising, pinpoint red spots on the skin, black, tarry stools, blood in the urine -signs of decreased red blood cells - unusually weak or tired, fainting spells, lightheadedness -breathing problems -chest pain -fast, irregular heartbeat -mouth sores -nausea, vomiting -pain, swelling, redness at site where injected -pain, tingling, numbness in the hands or feet -swelling of ankles, feet, or hands -unusual bleeding or bruising Side effects that usually do not require medical attention (report to your doctor or health care professional if they continue or are bothersome): -diarrhea -facial flushing -hair loss -loss of appetite -missed menstrual periods -nail discoloration or damage -red or watery eyes -red colored urine -stomach upset This list may not describe all possible side effects. Call your doctor for medical advice about side effects. You may report side effects to FDA at 1-800-FDA-1088. Where should I keep my medicine? This drug is given in a hospital or clinic and will not be stored at home. NOTE: This sheet is a summary. It may not cover all possible information. If you have questions about this medicine, talk to your doctor, pharmacist, or health care provider.  2015, Elsevier/Gold Standard. (2012-05-11 09:54:34)  Vincristine injection What is this medicine? VINCRISTINE (vin KRIS teen) is a chemotherapy drug. It slows the growth of cancer cells. This medicine is used to treat many types of cancer like Hodgkin's disease, leukemia, non-Hodgkin's lymphoma, neuroblastoma (brain cancer), rhabdomyosarcoma, and Wilms' tumor. This medicine may be used for other purposes; ask your health care provider or pharmacist if you have questions. COMMON BRAND NAME(S): Oncovin, Vincasar PFS What should I tell my health care provider before I take this medicine? They need to know if you have any of these conditions: -blood  disorders -gout -infection (especially chickenpox, cold sores, or herpes) -kidney disease -liver disease -lung disease -nervous system disease like Charcot-Marie-Tooth (CMT) -recent or ongoing radiation therapy -an unusual or allergic reaction to vincristine, other chemotherapy agents, other medicines, foods, dyes, or preservatives -pregnant or trying to get pregnant -breast-feeding How should I use this medicine? This drug is given as an infusion into a vein. It is administered in a hospital or clinic by a specially trained health care professional. If you have pain, swelling, burning, or any unusual feeling around the site of your injection, tell your health care professional right away. Talk to your pediatrician regarding the use of this medicine in children. While this drug may be prescribed for selected conditions, precautions do apply. Overdosage: If you think you have taken too much of this medicine contact a poison control center or emergency room at once. NOTE: This medicine is only for you. Do not share this medicine with others. What if I miss a dose? It is important not to miss your dose. Call your doctor or health care professional if you are unable to keep an appointment. What may interact with this medicine? Do not take this medicine with any of the following medications: -itraconazole -mibefradil -voriconazole This medicine may also interact with the following medications: -cyclosporine -erythromycin -fluconazole -ketoconazole -medicines for HIV like delavirdine, efavirenz, nevirapine -medicines for seizures like ethotoin, fosphenotoin, phenytoin -medicines to increase blood counts like filgrastim, pegfilgrastim, sargramostim -other chemotherapy drugs like cisplatin, L-asparaginase, methotrexate, mitomycin, paclitaxel -pegaspargase -vaccines -  zalcitabine, ddC Talk to your doctor or health care professional before taking any of these  medicines: -acetaminophen -aspirin -ibuprofen -ketoprofen -naproxen This list may not describe all possible interactions. Give your health care provider a list of all the medicines, herbs, non-prescription drugs, or dietary supplements you use. Also tell them if you smoke, drink alcohol, or use illegal drugs. Some items may interact with your medicine. What should I watch for while using this medicine? Your condition will be monitored carefully while you are receiving this medicine. You will need important blood work done while you are taking this medicine. This drug may make you feel generally unwell. This is not uncommon, as chemotherapy can affect healthy cells as well as cancer cells. Report any side effects. Continue your course of treatment even though you feel ill unless your doctor tells you to stop. In some cases, you may be given additional medicines to help with side effects. Follow all directions for their use. Call your doctor or health care professional for advice if you get a fever, chills or sore throat, or other symptoms of a cold or flu. Do not treat yourself. Avoid taking products that contain aspirin, acetaminophen, ibuprofen, naproxen, or ketoprofen unless instructed by your doctor. These medicines may hide a fever. Do not become pregnant while taking this medicine. Women should inform their doctor if they wish to become pregnant or think they might be pregnant. There is a potential for serious side effects to an unborn child. Talk to your health care professional or pharmacist for more information. Do not breast-feed an infant while taking this medicine. Men may have a lower sperm count while taking this medicine. Talk to your doctor if you plan to father a child. What side effects may I notice from receiving this medicine? Side effects that you should report to your doctor or health care professional as soon as possible: -allergic reactions like skin rash, itching or hives,  swelling of the face, lips, or tongue -breathing problems -confusion or changes in emotions or moods -constipation -cough -mouth sores -muscle weakness -nausea and vomiting -pain, swelling, redness or irritation at the injection site -pain, tingling, numbness in the hands or feet -problems with balance, talking, walking -seizures -stomach pain -trouble passing urine or change in the amount of urine Side effects that usually do not require medical attention (report to your doctor or health care professional if they continue or are bothersome): -diarrhea -hair loss -jaw pain -loss of appetite This list may not describe all possible side effects. Call your doctor for medical advice about side effects. You may report side effects to FDA at 1-800-FDA-1088. Where should I keep my medicine? This drug is given in a hospital or clinic and will not be stored at home. NOTE: This sheet is a summary. It may not cover all possible information. If you have questions about this medicine, talk to your doctor, pharmacist, or health care provider.  2015, Else[employer]/Gold Standard. (2007-10-11 17:17:13)   Cyclophosphamide injection What is this medicine? CYCLOPHOSPHAMIDE (sye kloe FOSS fa mide) is a chemotherapy drug. It slows the growth of cancer cells. This medicine is used to treat many types of cancer like lymphoma, myeloma, leukemia, breast cancer, and ovarian cancer, to name a few. This medicine may be used for other purposes; ask your health care provider or pharmacist if you have questions. COMMON BRAND NAME(S): Cytoxan, Neosar What should I tell my health care provider before I take this medicine? They need to know if you have  any of these conditions: -blood disorders -history of other chemotherapy -infection -kidney disease -liver disease -recent or ongoing radiation therapy -tumors in the bone marrow -an unusual or allergic reaction to cyclophosphamide, other chemotherapy, other  medicines, foods, dyes, or preservatives -pregnant or trying to get pregnant -breast-feeding How should I use this medicine? This drug is usually given as an injection into a vein or muscle or by infusion into a vein. It is administered in a hospital or clinic by a specially trained health care professional. Talk to your pediatrician regarding the use of this medicine in children. Special care may be needed. Overdosage: If you think you have taken too much of this medicine contact a poison control center or emergency room at once. NOTE: This medicine is only for you. Do not share this medicine with others. What if I miss a dose? It is important not to miss your dose. Call your doctor or health care professional if you are unable to keep an appointment. What may interact with this medicine? This medicine may interact with the following medications: -amiodarone -amphotericin B -azathioprine -certain antiviral medicines for HIV or AIDS such as protease inhibitors (e.g., indinavir, ritonavir) and zidovudine -certain blood pressure medications such as benazepril, captopril, enalapril, fosinopril, lisinopril, moexipril, monopril, perindopril, quinapril, ramipril, trandolapril -certain cancer medications such as anthracyclines (e.g., daunorubicin, doxorubicin), busulfan, cytarabine, paclitaxel, pentostatin, tamoxifen, trastuzumab -certain diuretics such as chlorothiazide, chlorthalidone, hydrochlorothiazide, indapamide, metolazone -certain medicines that treat or prevent blood clots like warfarin -certain muscle relaxants such as succinylcholine -cyclosporine -etanercept -indomethacin -medicines to increase blood counts like filgrastim, pegfilgrastim, sargramostim -medicines used as general anesthesia -metronidazole -natalizumab This list may not describe all possible interactions. Give your health care provider a list of all the medicines, herbs, non-prescription drugs, or dietary supplements  you use. Also tell them if you smoke, drink alcohol, or use illegal drugs. Some items may interact with your medicine. What should I watch for while using this medicine? Visit your doctor for checks on your progress. This drug may make you feel generally unwell. This is not uncommon, as chemotherapy can affect healthy cells as well as cancer cells. Report any side effects. Continue your course of treatment even though you feel ill unless your doctor tells you to stop. Drink water or other fluids as directed. Urinate often, even at night. In some cases, you may be given additional medicines to help with side effects. Follow all directions for their use. Call your doctor or health care professional for advice if you get a fever, chills or sore throat, or other symptoms of a cold or flu. Do not treat yourself. This drug decreases your body's ability to fight infections. Try to avoid being around people who are sick. This medicine may increase your risk to bruise or bleed. Call your doctor or health care professional if you notice any unusual bleeding. Be careful brushing and flossing your teeth or using a toothpick because you may get an infection or bleed more easily. If you have any dental work done, tell your dentist you are receiving this medicine. You may get drowsy or dizzy. Do not drive, use machinery, or do anything that needs mental alertness until you know how this medicine affects you. Do not become pregnant while taking this medicine or for 1 year after stopping it. Women should inform their doctor if they wish to become pregnant or think they might be pregnant. Men should not father a child while taking this medicine and for 4 months after  stopping it. There is a potential for serious side effects to an unborn child. Talk to your health care professional or pharmacist for more information. Do not breast-feed an infant while taking this medicine. This medicine may interfere with the ability to  have a child. This medicine has caused ovarian failure in some women. This medicine has caused reduced sperm counts in some men. You should talk with your doctor or health care professional if you are concerned about your fertility. If you are going to have surgery, tell your doctor or health care professional that you have taken this medicine. What side effects may I notice from receiving this medicine? Side effects that you should report to your doctor or health care professional as soon as possible: -allergic reactions like skin rash, itching or hives, swelling of the face, lips, or tongue -low blood counts - this medicine may decrease the number of white blood cells, red blood cells and platelets. You may be at increased risk for infections and bleeding. -signs of infection - fever or chills, cough, sore throat, pain or difficulty passing urine -signs of decreased platelets or bleeding - bruising, pinpoint red spots on the skin, black, tarry stools, blood in the urine -signs of decreased red blood cells - unusually weak or tired, fainting spells, lightheadedness -breathing problems -dark urine -dizziness -palpitations -swelling of the ankles, feet, hands -trouble passing urine or change in the amount of urine -weight gain -yellowing of the eyes or skin Side effects that usually do not require medical attention (report to your doctor or health care professional if they continue or are bothersome): -changes in nail or skin color -hair loss -missed menstrual periods -mouth sores -nausea, vomiting This list may not describe all possible side effects. Call your doctor for medical advice about side effects. You may report side effects to FDA at 1-800-FDA-1088. Where should I keep my medicine? This drug is given in a hospital or clinic and will not be stored at home. NOTE: This sheet is a summary. It may not cover all possible information. If you have questions about this medicine, talk to  your doctor, pharmacist, or health care provider.  2015, [employer] Standard. (2011-11-28 16:22:58)  Rituximab injection What is this medicine? RITUXIMAB (ri TUX i mab) is a monoclonal antibody. This medicine changes the way the body's immune system works. It is used commonly to treat non-Hodgkin's lymphoma and other conditions. In cancer cells, this drug targets a specific protein within cancer cells and stops the cancer cells from growing. It is also used to treat rhuematoid arthritis (RA). In RA, this medicine slow the inflammatory process and help reduce joint pain and swelling. This medicine is often used with other cancer or arthritis medications. This medicine may be used for other purposes; ask your health care provider or pharmacist if you have questions. COMMON BRAND NAME(S): Rituxan What should I tell my health care provider before I take this medicine? They need to know if you have any of these conditions: -blood disorders -heart disease -history of hepatitis B -infection (especially a virus infection such as chickenpox, cold sores, or herpes) -irregular heartbeat -kidney disease -lung or breathing disease, like asthma -lupus -an unusual or allergic reaction to rituximab, mouse proteins, other medicines, foods, dyes, or preservatives -pregnant or trying to get pregnant -breast-feeding How should I use this medicine? This medicine is for infusion into a vein. It is administered in a hospital or clinic by a specially trained health care professional. A special MedGuide will  be given to you by the pharmacist with each prescription and refill. Be sure to read this information carefully each time. Talk to your pediatrician regarding the use of this medicine in children. This medicine is not approved for use in children. Overdosage: If you think you have taken too much of this medicine contact a poison control center or emergency room at once. NOTE: This medicine is only for you.  Do not share this medicine with others. What if I miss a dose? It is important not to miss a dose. Call your doctor or health care professional if you are unable to keep an appointment. What may interact with this medicine? -cisplatin -medicines for blood pressure -some other medicines for arthritis -vaccines This list may not describe all possible interactions. Give your health care provider a list of all the medicines, herbs, non-prescription drugs, or dietary supplements you use. Also tell them if you smoke, drink alcohol, or use illegal drugs. Some items may interact with your medicine. What should I watch for while using this medicine? Report any side effects that you notice during your treatment right away, such as changes in your breathing, fever, chills, dizziness or lightheadedness. These effects are more common with the first dose. Visit your prescriber or health care professional for checks on your progress. You will need to have regular blood work. Report any other side effects. The side effects of this medicine can continue after you finish your treatment. Continue your course of treatment even though you feel ill unless your doctor tells you to stop. Call your doctor or health care professional for advice if you get a fever, chills or sore throat, or other symptoms of a cold or flu. Do not treat yourself. This drug decreases your body's ability to fight infections. Try to avoid being around people who are sick. This medicine may increase your risk to bruise or bleed. Call your doctor or health care professional if you notice any unusual bleeding. Be careful brushing and flossing your teeth or using a toothpick because you may get an infection or bleed more easily. If you have any dental work done, tell your dentist you are receiving this medicine. Avoid taking products that contain aspirin, acetaminophen, ibuprofen, naproxen, or ketoprofen unless instructed by your doctor. These  medicines may hide a fever. Do not become pregnant while taking this medicine. Women should inform their doctor if they wish to become pregnant or think they might be pregnant. There is a potential for serious side effects to an unborn child. Talk to your health care professional or pharmacist for more information. Do not breast-feed an infant while taking this medicine. What side effects may I notice from receiving this medicine? Side effects that you should report to your doctor or health care professional as soon as possible: -allergic reactions like skin rash, itching or hives, swelling of the face, lips, or tongue -low blood counts - this medicine may decrease the number of white blood cells, red blood cells and platelets. You may be at increased risk for infections and bleeding. -signs of infection - fever or chills, cough, sore throat, pain or difficulty passing urine -signs of decreased platelets or bleeding - bruising, pinpoint red spots on the skin, black, tarry stools, blood in the urine -signs of decreased red blood cells - unusually weak or tired, fainting spells, lightheadedness -breathing problems -confused, not responsive -chest pain -fast, irregular heartbeat -feeling faint or lightheaded, falls -mouth sores -redness, blistering, peeling or loosening of the skin,  including inside the mouth -stomach pain -swelling of the ankles, feet, or hands -trouble passing urine or change in the amount of urine Side effects that usually do not require medical attention (report to your doctor or other health care professional if they continue or are bothersome): -anxiety -headache -loss of appetite -muscle aches -nausea -night sweats This list may not describe all possible side effects. Call your doctor for medical advice about side effects. You may report side effects to FDA at 1-800-FDA-1088. Where should I keep my medicine? This drug is given in a hospital or clinic and will not be  stored at home. NOTE: This sheet is a summary. It may not cover all possible information. If you have questions about this medicine, talk to your doctor, pharmacist, or health care provider.  2015, Elsevier/Gold Standard. (2007-09-13 14:04:59)

## 2014-02-13 NOTE — Progress Notes (Signed)
Marissa Turner Tolerated chemotherapy well today.  Discharged ambulatory

## 2014-02-14 NOTE — Assessment & Plan Note (Signed)
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 She was treated originally with Treanda Rituxan and was felt to have progression. She was changed to R CHOP. Based on PET imaging has had a complete response after 2 cycles. She will complete cycle 3 on Monday and I have recommended referral to radiation for completion of therapy.  She did not undergo a bone marrow biopsy as part of her initial staging, as per NCCN and guidelines. Because of this I have recommended at least an LP with flow cytometry and cytology. I discussed the rationale of this with the patient and she does agree. I will see her back in several weeks sooner if needed. She knows to call any time during the week with any problems or concerns.

## 2014-02-15 ENCOUNTER — Other Ambulatory Visit (HOSPITAL_COMMUNITY): Payer: Self-pay | Admitting: Hematology and Oncology

## 2014-02-15 ENCOUNTER — Encounter (HOSPITAL_BASED_OUTPATIENT_CLINIC_OR_DEPARTMENT_OTHER): Payer: Medicare Other

## 2014-02-15 ENCOUNTER — Encounter (HOSPITAL_COMMUNITY): Payer: Self-pay

## 2014-02-15 DIAGNOSIS — C8331 Diffuse large B-cell lymphoma, lymph nodes of head, face, and neck: Secondary | ICD-10-CM

## 2014-02-15 DIAGNOSIS — C8580 Other specified types of non-Hodgkin lymphoma, unspecified site: Secondary | ICD-10-CM

## 2014-02-15 LAB — BETA 2 MICROGLOBULIN, SERUM: Beta-2 Microglobulin: 2.65 mg/L — ABNORMAL HIGH (ref <=2.51)

## 2014-02-15 MED ORDER — PEGFILGRASTIM INJECTION 6 MG/0.6ML ~~LOC~~
PREFILLED_SYRINGE | SUBCUTANEOUS | Status: AC
Start: 2014-02-15 — End: 2014-02-15
  Filled 2014-02-15: qty 0.6

## 2014-02-15 MED ORDER — PEGFILGRASTIM INJECTION 6 MG/0.6ML ~~LOC~~
6.0000 mg | PREFILLED_SYRINGE | Freq: Once | SUBCUTANEOUS | Status: AC
Start: 2014-02-15 — End: 2014-02-15
  Administered 2014-02-15: 6 mg via SUBCUTANEOUS

## 2014-02-15 NOTE — Patient Instructions (Signed)
[place] at [facility] Discharge Instructions  RECOMMENDATIONS MADE BY THE CONSULTANT AND ANY TEST RESULTS WILL BE SENT TO YOUR REFERRING PHYSICIAN. Today you received your neulasta. Please follow up as scheduled.   Thank you for choosing [place] at [facility] to provide your oncology and hematology care.  To afford each patient quality time with our provider, please arrive at least 15 minutes before your scheduled appointment time.    You need to re-schedule your appointment should you arrive 10 or more minutes late.  We strive to give you quality time with our providers, and arriving late affects you and other patients whose appointments are after yours.  Also, if you no show three or more times for appointments you may be dismissed from the clinic at the providers discretion.     Again, thank you for choosing [facility].  Our hope is that these requests will decrease the amount of time that you wait before being seen by our physicians.       _____________________________________________________________  Should you have questions after your visit to [facility], please contact our office at [phone] between the hours of 8:30 a.m. and 4:30 p.m.  Voicemails left after 4:30 p.m. will not be returned until the following business day.  For prescription refill requests, have your pharmacy contact our office.

## 2014-02-15 NOTE — Progress Notes (Signed)
Patient tolerated injection well.

## 2014-02-20 ENCOUNTER — Other Ambulatory Visit (HOSPITAL_COMMUNITY): Payer: Self-pay | Admitting: Hematology & Oncology

## 2014-02-20 ENCOUNTER — Other Ambulatory Visit: Payer: Self-pay | Admitting: Radiation Oncology

## 2014-02-24 ENCOUNTER — Other Ambulatory Visit (HOSPITAL_COMMUNITY): Payer: Self-pay | Admitting: Oncology

## 2014-02-24 DIAGNOSIS — C8580 Other specified types of non-Hodgkin lymphoma, unspecified site: Secondary | ICD-10-CM

## 2014-02-24 MED ORDER — CITALOPRAM HYDROBROMIDE 20 MG PO TABS: 20.0000 mg | ORAL_TABLET | Freq: Every day | ORAL | Status: DC

## 2014-02-24 MED ORDER — ACYCLOVIR 400 MG PO TABS: 400.0000 mg | ORAL_TABLET | Freq: Every day | ORAL | Status: DC

## 2014-03-02 ENCOUNTER — Ambulatory Visit (HOSPITAL_COMMUNITY)
Admission: RE | Admit: 2014-03-02 | Discharge: 2014-03-02 | Disposition: A | Discharge status: Home / Self Care | Payer: Medicare Other | Source: Ambulatory Visit | Attending: Hematology & Oncology | Admitting: Hematology & Oncology

## 2014-03-02 DIAGNOSIS — C8580 Other specified types of non-Hodgkin lymphoma, unspecified site: Secondary | ICD-10-CM

## 2014-03-02 DIAGNOSIS — C8510 Unspecified B-cell lymphoma, unspecified site: Secondary | ICD-10-CM | POA: Diagnosis not present

## 2014-03-02 DIAGNOSIS — R839 Unspecified abnormal finding in cerebrospinal fluid: Secondary | ICD-10-CM | POA: Diagnosis not present

## 2014-03-02 DIAGNOSIS — Z9221 Personal history of antineoplastic chemotherapy: Secondary | ICD-10-CM | POA: Insufficient documentation

## 2014-03-02 DIAGNOSIS — C8590 Non-Hodgkin lymphoma, unspecified, unspecified site: Secondary | ICD-10-CM | POA: Diagnosis not present

## 2014-03-02 MED ORDER — ACETAMINOPHEN 325 MG PO TABS: 650.0000 mg | ORAL_TABLET | ORAL | Status: DC | PRN

## 2014-03-02 NOTE — Discharge Instructions (Signed)
Lumbar Puncture, Care After Refer to this sheet in the next few weeks. These instructions provide you with information on caring for yourself after your procedure. Your health care provider may also give you more specific instructions. Your treatment has been planned according to current medical practices, but problems sometimes occur. Call your health care provider if you have any problems or questions after your procedure. WHAT TO EXPECT AFTER THE PROCEDURE After your procedure, it is typical to have the following sensations:  Mild discomfort or pain at the insertion site.  Mild headache that is relieved with pain medicines. HOME CARE INSTRUCTIONS  Avoid lifting anything heavier than 10 lb (4.5 kg) for at least 12 hours after the procedure.  Drink enough fluids to keep your urine clear or pale yellow. SEEK MEDICAL CARE IF:  You have fever or chills.  You have nausea or vomiting.  You have a headache that lasts for more than 2 days. SEEK IMMEDIATE MEDICAL CARE IF:  You have any numbness or tingling in your legs.  You are unable to control your bowel or bladder.  You have bleeding or swelling in your back at the insertion site.  You are dizzy or faint. Document Released: 01/18/2013 Document Reviewed: 01/18/2013 [facility] Patient Information 2015 [place], [place]. This information is not intended to replace advice given to you by your health care provider. Make sure you discuss any questions you have with your health care provider.

## 2014-03-02 NOTE — Procedures (Signed)
Preprocedure Dx: Non Hodgkins lymphoma post chemotherapy Postprocedure Dx: Non Hodgkins lymphoma post chemotherapy Procedure:  Fluoroscopically guided lumbar puncture Radiologist:  Thornton Papas Anesthesia:  2 ml of 1% lidocaine Specimen:  9 ml CSF clear colorless EBL:   None Opening pressure: 3 cm W8G (prone) Complications: None

## 2014-03-06 ENCOUNTER — Encounter (HOSPITAL_COMMUNITY): Payer: Self-pay | Admitting: Lab

## 2014-03-06 ENCOUNTER — Inpatient Hospital Stay (HOSPITAL_COMMUNITY): Payer: Medicare Other

## 2014-03-06 ENCOUNTER — Ambulatory Visit (INDEPENDENT_AMBULATORY_CARE_PROVIDER_SITE_OTHER): Payer: Medicare Other | Admitting: *Deleted

## 2014-03-06 ENCOUNTER — Encounter (HOSPITAL_COMMUNITY): Payer: Medicare Other | Attending: Hematology and Oncology | Admitting: Hematology & Oncology

## 2014-03-06 ENCOUNTER — Encounter (HOSPITAL_COMMUNITY): Payer: Self-pay | Admitting: Hematology & Oncology

## 2014-03-06 VITALS — BP 120/82 | HR 80 | Temp 98.4°F | Resp 18 | Wt 116.5 lb

## 2014-03-06 DIAGNOSIS — I482 Chronic atrial fibrillation, unspecified: Secondary | ICD-10-CM

## 2014-03-06 DIAGNOSIS — I10 Essential (primary) hypertension: Secondary | ICD-10-CM | POA: Insufficient documentation

## 2014-03-06 DIAGNOSIS — J449 Chronic obstructive pulmonary disease, unspecified: Secondary | ICD-10-CM | POA: Insufficient documentation

## 2014-03-06 DIAGNOSIS — Z7901 Long term (current) use of anticoagulants: Secondary | ICD-10-CM

## 2014-03-06 DIAGNOSIS — Z9079 Acquired absence of other genital organ(s): Secondary | ICD-10-CM | POA: Insufficient documentation

## 2014-03-06 DIAGNOSIS — Z9089 Acquired absence of other organs: Secondary | ICD-10-CM | POA: Insufficient documentation

## 2014-03-06 DIAGNOSIS — Z90710 Acquired absence of both cervix and uterus: Secondary | ICD-10-CM | POA: Insufficient documentation

## 2014-03-06 DIAGNOSIS — C8580 Other specified types of non-Hodgkin lymphoma, unspecified site: Secondary | ICD-10-CM

## 2014-03-06 DIAGNOSIS — Z980 Intestinal bypass and anastomosis status: Secondary | ICD-10-CM | POA: Insufficient documentation

## 2014-03-06 DIAGNOSIS — C8339 Diffuse large B-cell lymphoma, extranodal and solid organ sites: Secondary | ICD-10-CM | POA: Diagnosis not present

## 2014-03-06 DIAGNOSIS — C8519 Unspecified B-cell lymphoma, extranodal and solid organ sites: Secondary | ICD-10-CM | POA: Insufficient documentation

## 2014-03-06 DIAGNOSIS — Z5181 Encounter for therapeutic drug level monitoring: Secondary | ICD-10-CM

## 2014-03-06 DIAGNOSIS — I4891 Unspecified atrial fibrillation: Secondary | ICD-10-CM | POA: Diagnosis not present

## 2014-03-06 DIAGNOSIS — F17210 Nicotine dependence, cigarettes, uncomplicated: Secondary | ICD-10-CM | POA: Insufficient documentation

## 2014-03-06 DIAGNOSIS — F329 Major depressive disorder, single episode, unspecified: Secondary | ICD-10-CM | POA: Insufficient documentation

## 2014-03-06 DIAGNOSIS — M47817 Spondylosis without myelopathy or radiculopathy, lumbosacral region: Secondary | ICD-10-CM | POA: Insufficient documentation

## 2014-03-06 LAB — POCT INR: INR: 1.2

## 2014-03-06 MED ORDER — HYDROCODONE-ACETAMINOPHEN 5-325 MG PO TABS: 0.5000 | ORAL_TABLET | Freq: Four times a day (QID) | ORAL | Status: DC | PRN

## 2014-03-06 NOTE — Assessment & Plan Note (Signed)
74 year old female with DLBCL, Stage IE who has completed 3 cycles of R-CHOP.  She was initiallly started on Treanda/Rituxan but by imaging was felt to have progressive disease.  At the time of work-up BMBX was not done as part of her evaluation.  PET/CT on 02/06/2014 showed resolution of disease. She completed her last cycle of chemotherapy on 02/13/2014.  I reviewed the NCCN guidelines with her and advised her that current recommendations for therapy are 6 cycles of R-CHOP or 3 cycles of R-CHOP + RT.  I discussed RT in lymphomas and advised her that patients I have had do very well. It is a different disease then primary cancers of the head and neck.  Her sister was with her today. She had a very good understanding of recommended therapy.  She has agreed to go for an XRT consultation.  I will tentatively see her back in 4 to 6 weeks.

## 2014-03-06 NOTE — Progress Notes (Signed)
Referral made to [facility] to [place]. 2/8.  Records faxed on 2/8

## 2014-03-06 NOTE — Patient Instructions (Signed)
[place] Discharge Instructions  RECOMMENDATIONS MADE BY THE CONSULTANT AND ANY TEST RESULTS WILL BE SENT TO YOUR REFERRING PHYSICIAN.  SPECIAL INSTRUCTIONS/FOLLOW-UP:  Please call prior to your next visit with any problems or concerns. We will see you back again in four weeks with labs and an office visit.     Thank you for choosing [place] to provide your oncology and hematology care.  To afford each patient quality time with our providers, please arrive at least 15 minutes before your scheduled appointment time.  With your help, our goal is to use those 15 minutes to complete the necessary work-up to ensure our physicians have the information they need to help with your evaluation and healthcare recommendations.    Effective January 1st, 2014, we ask that you re-schedule your appointment with our physicians should you arrive 10 or more minutes late for your appointment.  We strive to give you quality time with our providers, and arriving late affects you and other patients whose appointments are after yours.    Again, thank you for choosing [facility].  Our hope is that these requests will decrease the amount of time that you wait before being seen by our physicians.       _____________________________________________________________  Should you have questions after your visit to [facility], please contact our office at [phone] between the hours of 8:30 a.m. and 5:00 p.m.  Voicemails left after 4:30 p.m. will not be returned until the following business day.  For prescription refill requests, have your pharmacy contact our office with your prescription refill request.

## 2014-03-06 NOTE — Progress Notes (Signed)
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  EXAM: NUCLEAR MEDICINE PET SKULL BASE TO THIGH IMPRESSION: 1. Resolution of the previously enlarged right-sided station 2 lymph node in the neck with resolution of the abnormal right vallecular activity in the neck. Currently no specific in findings of residual lymphoma. 2. Diffuse slightly heterogeneous marrow activity, probably incidental or due to marrow reactivation. 3. Bilateral upper lobe pulmonary nodules, larger on the left. The left-sided nodule has been reported back through 2004 and is not hypermetabolic, and likely benign. 4. Ancillary findings include diffuse hepatic steatosis, atherosclerosis, bilateral renal hips, severe arthropathy of the left hip, and emphysema.   Electronically Signed  By: Sherryl Barters M.D.  On: 02/06/2014 12:22  CURRENT THERAPY: Changed to RCHOP (01/02/2014) after 2 cycles of Treanda/Rituxan.  INTERVAL HISTORY: Marissa Turner 74 y.o. female returns today in regards to her NHL. Prior to initiation of therapy she did not have a BMBX, she has recently undergone an LP with spinal fluid analysis. She had no problems with the procedure. Flow cytometry is pending.  She completed 3 cycles of R-CHOP. She had an appointment for XRT but did not go.  She states she has heard too many terrible stories of people being "burnt up" from radiation. She did not go to her appointment.    MEDICAL HISTORY: Past Medical History  Diagnosis Date  . Atrial fibrillation, chronic 2004    left atrial thrombus in 2004; near syncope in 2008; echocardiogram in 2008-normal EF, mild left atrial enlargement, no valvular abnormalities, lipomatous hypertrophy; no aortic atheroma on TEE in 2004  . Chronic anticoagulation 2004    warfarin  .  Tobacco abuse     40 pack years; quit attempt in 2012  . DJD (degenerative joint disease), lumbosacral     Also hip  . Peptic ulcer disease     s/p Billroth II  . Depression   . Fracture of wrist     Left  . Hypertension     Normal carotid ultrasound in 2008  . PONV (postoperative nausea and vomiting)   . Dysrhythmia     chronic AFib  . Cancer     has SPINAL STENOSIS; DJD (degenerative joint disease), lumbosacral; Atrial fibrillation, chronic; Chronic anticoagulation; Tobacco abuse; Peptic ulcer disease; Hypertension; Encounter for therapeutic drug monitoring; Lymphoma malignant, large cell; and Iron deficiency on her problem list.      Lymphoma malignant, large cell   09/15/2013 Initial Diagnosis Diagnosis Lymph node, biopsy, cervical left neck - DIFFUSE LARGE B CELL LYMPHOMA.   10/06/2013 PET scan Hypermetabolic thickening within the right vallecular consists with primary head and neck cancer.  Single enlarged hypermetabolic right level IIa lymph node consistent local nodal metastasis.   10/31/2013 - 11/29/2013 Chemotherapy Bendamustine/Rituxan x 2 cycles   12/19/2013 Progression PET- Progression of presumed vallecular lymphoma as evidenced by increased hypermetabolism.   12/20/2013 Treatment Plan Change Per Dr. Gari Crown, patient has a CD10 negative, non-germinal center non-Hodgkin's lymphoma.  Patient may be a candidate for additional of Revlimid to treatment plan.   12/28/2013 Echocardiogram MUGA- Normal left ventricular wall motion with calculated ejection fraction of 60%.   01/02/2014 - 02/13/2014 Chemotherapy R-CHOP with Neulasta support.   02/06/2014 PET scan Resolution of the previously enlarged right-sided station 2 lymph  node in the  neck with resolution of the abnormal right vallecular  activity in the neck. Currently no specific in findings of residual  lymphoma.     is allergic to diphenhydramine hcl; estrogens; penicillins; and codeine.  Ms. Hornung had no medications administered  during this visit.  SURGICAL HISTORY: Past Surgical History  Procedure Laterality Date  . Cholecystectomy  2004    Holcomb II; splenectomy; incidental appendectomy  . Abdominal hysterectomy      w/o oophorectomy  . Cesarean section    . Tonsillectomy    . Colonoscopy  1980s    patient denies ever having undergone colonoscopy as of 2014  . Appendectomy    . Splenectomy, total    . Lymph node biopsy Left 09/15/2013    Procedure: LEFT NECK CERVICAL NODE BIOPSY;  Surgeon: Scherry Ran, MD;  Location: [facility];  Service: General;  Laterality: Left;  . Portacath placement Right 10/21/2013    Procedure: ATTEMPTED INSERTION PORT-A-CATH;  Surgeon: Scherry Ran, MD;  Location: [facility];  Service: General;  Laterality: Right;    SOCIAL HISTORY: History   Social History  . Marital Status: Married    Spouse Name: N/A    Number of Children: 2  . Years of Education: N/A   Occupational History  . Housecleaning    Social History Main Topics  . Smoking status: Current Every Day Smoker -- 0.25 packs/day for 55 years    Types: Cigarettes  . Smokeless tobacco: Not on file     Comment: Quit attempt in 2012  . Alcohol Use: No  . Drug Use: No  . Sexual Activity: Yes    Birth Control/ Protection: Surgical   Other Topics Concern  . Not on file   Social History Narrative   Married with one child and one adopted child, 1 stillbirth, and one deceased child at age 50 due to illness   No regular exercise    FAMILY HISTORY: Family History  Problem Relation Age of Onset  . Heart attack Mother     also brother  . Breast cancer Sister   . Heart attack Brother   . Breast cancer Sister     x2  . Cirrhosis Sister   . Cancer Brother     Gastric carcinoma; also father    Review of Systems  Constitutional: Negative for fever, chills, weight loss and malaise/fatigue.  HENT: Negative for congestion, hearing loss, nosebleeds, sore throat  and tinnitus.   Eyes: Negative for blurred vision, double vision, pain and discharge.  Respiratory: Negative for cough, hemoptysis, sputum production, shortness of breath and wheezing.   Cardiovascular: Negative for chest pain, palpitations, claudication, leg swelling and PND.  Gastrointestinal: Negative for heartburn, nausea, vomiting, abdominal pain, diarrhea, constipation, blood in stool and melena.  Genitourinary: Negative for dysuria, urgency, frequency and hematuria.  Musculoskeletal: Positive for joint pain. Negative for myalgias and falls.  Skin: Negative for itching and rash.  Neurological: Negative for dizziness, tingling, tremors, sensory change, speech change, focal weakness, seizures, loss of consciousness, weakness and headaches.  Endo/Heme/Allergies: Does not bruise/bleed easily.  Psychiatric/Behavioral: Negative for depression, suicidal ideas, memory loss and substance abuse. The patient is not nervous/anxious and does not have insomnia.     PHYSICAL EXAMINATION  ECOG PERFORMANCE STATUS: 0 - Asymptomatic  Filed Vitals:   03/06/14 1054  BP: 120/82  Pulse: 80  Temp: 98.4 F (36.9 C)  Resp: 18    Physical Exam  Constitutional:  She is oriented to person, place, and time and well-developed, well-nourished, and in no distress.  Alopecia, wearing a hair prosthesis and a hat  HENT:  Head: Normocephalic and atraumatic.  Mouth/Throat: Oropharynx is clear and moist. No oropharyngeal exudate.  Eyes: Conjunctivae and EOM are normal. Pupils are equal, round, and reactive to light. Right eye exhibits no discharge. Left eye exhibits no discharge.  Neck: Normal range of motion. Neck supple. No JVD present. No thyromegaly present.  Cardiovascular: Normal rate, regular rhythm and normal heart sounds.   No murmur heard. Pulmonary/Chest: Effort normal and breath sounds normal. No respiratory distress. She has no wheezes. She has no rales. She exhibits no tenderness.  Abdominal: Soft.  Bowel sounds are normal. She exhibits no distension and no mass. There is no tenderness. There is no rebound and no guarding.  Musculoskeletal: Normal range of motion. She exhibits no edema.  Lymphadenopathy:    She has no cervical adenopathy.  Neurological: She is alert and oriented to person, place, and time. She displays normal reflexes. No cranial nerve deficit. Coordination normal.  Skin: Skin is warm and dry. No rash noted. No erythema. No pallor.  Psychiatric: Mood, memory, affect and judgment normal.    LABORATORY DATA:  CBC    Component Value Date/Time   WBC 6.7 02/13/2014 0900   RBC 3.47* 02/13/2014 0900   RBC 4.84 10/10/2013 1502   HGB 11.2* 02/13/2014 0900   HCT 33.7* 02/13/2014 0900   PLT 353 02/13/2014 0900   MCV 97.1 02/13/2014 0900   MCH 32.3 02/13/2014 0900   MCHC 33.2 02/13/2014 0900   RDW 18.8* 02/13/2014 0900   LYMPHSABS 1.3 02/13/2014 0900   MONOABS 0.9 02/13/2014 0900   EOSABS 0.4 02/13/2014 0900   BASOSABS 0.1 02/13/2014 0900   CMP     Component Value Date/Time   NA 136 02/13/2014 0900   K 4.4 02/13/2014 0900   CL 102 02/13/2014 0900   CO2 28 02/13/2014 0900   GLUCOSE 96 02/13/2014 0900   BUN 10 02/13/2014 0900   CREATININE 0.62 02/13/2014 0900   CREATININE 0.65 06/30/2013 0840   CALCIUM 8.5 02/13/2014 0900   PROT 6.3 02/13/2014 0900   ALBUMIN 3.4* 02/13/2014 0900   AST 31 02/13/2014 0900   ALT 27 02/13/2014 0900   ALKPHOS 65 02/13/2014 0900   BILITOT 0.3 02/13/2014 0900   GFRNONAA 87* 02/13/2014 0900   GFRAA >90 02/13/2014 0900     PATHOLOGY: Interpretation Tissue-Flow Cytometry - PREDOMINANCE OF B CELLS. - SEE NOTE. Diagnosis Comment: Analysis of the lymphoid population shows predominance of B cells displaying pan B cell antigens including CD20 with lack of CD5 or CD10. B cells display lambda light chain excess. The overall features are worrisome for a B cell lymphoproliferative process.  Diagnosis Lymph node, biopsy, cervical  left neck - DIFFUSE LARGE B CELL LYMPHOMA. - SEE ONCOLOGY TABLE.  CLINICAL DATA: Subsequent treatment strategy for non-Hodgkin' s lymphoma.  EXAM: NUCLEAR MEDICINE PET SKULL BASE TO THIGH  IMPRESSION: 1. Resolution of the previously enlarged right-sided station 2 lymph node in the neck with resolution of the abnormal right vallecular activity in the neck. Currently no specific in findings of residual lymphoma. 2. Diffuse slightly heterogeneous marrow activity, probably incidental or due to marrow reactivation. 3. Bilateral upper lobe pulmonary nodules, larger on the left. The left-sided nodule has been reported back through 2004 and is not hypermetabolic, and likely benign. 4. Ancillary findings include diffuse hepatic steatosis, atherosclerosis, bilateral renal hips, severe arthropathy  of the left hip, and emphysema.   Electronically Signed  By: Sherryl Barters M.D.  On: 02/06/2014 12:22   ASSESSMENT and THERAPY PLAN:    Lymphoma malignant, large cell 74 year old female with DLBCL, Stage IE who has completed 3 cycles of R-CHOP.  She was initiallly started on Treanda/Rituxan but by imaging was felt to have progressive disease.  At the time of work-up BMBX was not done as part of her evaluation.  PET/CT on 02/06/2014 showed resolution of disease. She completed her last cycle of chemotherapy on 02/13/2014.  I reviewed the NCCN guidelines with her and advised her that current recommendations for therapy are 6 cycles of R-CHOP or 3 cycles of R-CHOP + RT.  I discussed RT in lymphomas and advised her that patients I have had do very well. It is a different disease then primary cancers of the head and neck.  Her sister was with her today. She had a very good understanding of recommended therapy.  She has agreed to go for an XRT consultation.  I will tentatively see her back in 4 to 6 weeks.     All questions were answered. The patient knows to call the clinic with any  problems, questions or concerns. We can certainly see the patient much sooner if necessary. Molli Hazard 03/06/2014

## 2014-03-08 ENCOUNTER — Ambulatory Visit (HOSPITAL_COMMUNITY): Payer: Medicare Other

## 2014-03-09 DIAGNOSIS — Z7901 Long term (current) use of anticoagulants: Secondary | ICD-10-CM | POA: Diagnosis not present

## 2014-03-09 DIAGNOSIS — Z803 Family history of malignant neoplasm of breast: Secondary | ICD-10-CM | POA: Diagnosis not present

## 2014-03-09 DIAGNOSIS — F17200 Nicotine dependence, unspecified, uncomplicated: Secondary | ICD-10-CM | POA: Diagnosis not present

## 2014-03-09 DIAGNOSIS — Z7952 Long term (current) use of systemic steroids: Secondary | ICD-10-CM | POA: Diagnosis not present

## 2014-03-09 DIAGNOSIS — I10 Essential (primary) hypertension: Secondary | ICD-10-CM | POA: Diagnosis not present

## 2014-03-09 DIAGNOSIS — C8331 Diffuse large B-cell lymphoma, lymph nodes of head, face, and neck: Secondary | ICD-10-CM | POA: Diagnosis not present

## 2014-03-09 DIAGNOSIS — Z510 Encounter for antineoplastic radiation therapy: Secondary | ICD-10-CM | POA: Diagnosis not present

## 2014-03-09 DIAGNOSIS — Z885 Allergy status to narcotic agent status: Secondary | ICD-10-CM | POA: Diagnosis not present

## 2014-03-09 DIAGNOSIS — Z888 Allergy status to other drugs, medicaments and biological substances status: Secondary | ICD-10-CM | POA: Diagnosis not present

## 2014-03-09 DIAGNOSIS — F329 Major depressive disorder, single episode, unspecified: Secondary | ICD-10-CM | POA: Diagnosis not present

## 2014-03-09 DIAGNOSIS — Z880 Allergy status to penicillin: Secondary | ICD-10-CM | POA: Diagnosis not present

## 2014-03-09 DIAGNOSIS — M1990 Unspecified osteoarthritis, unspecified site: Secondary | ICD-10-CM | POA: Diagnosis not present

## 2014-03-09 DIAGNOSIS — I4891 Unspecified atrial fibrillation: Secondary | ICD-10-CM | POA: Diagnosis not present

## 2014-03-09 DIAGNOSIS — Z79899 Other long term (current) drug therapy: Secondary | ICD-10-CM | POA: Diagnosis not present

## 2014-03-15 ENCOUNTER — Ambulatory Visit (INDEPENDENT_AMBULATORY_CARE_PROVIDER_SITE_OTHER): Payer: Medicare Other | Admitting: *Deleted

## 2014-03-15 DIAGNOSIS — I4891 Unspecified atrial fibrillation: Secondary | ICD-10-CM

## 2014-03-15 DIAGNOSIS — I482 Chronic atrial fibrillation, unspecified: Secondary | ICD-10-CM

## 2014-03-15 DIAGNOSIS — Z7901 Long term (current) use of anticoagulants: Secondary | ICD-10-CM

## 2014-03-15 DIAGNOSIS — Z5181 Encounter for therapeutic drug level monitoring: Secondary | ICD-10-CM | POA: Diagnosis not present

## 2014-03-15 LAB — POCT INR: INR: 1.8

## 2014-03-21 ENCOUNTER — Other Ambulatory Visit: Payer: Self-pay | Admitting: Cardiology

## 2014-03-21 DIAGNOSIS — I4891 Unspecified atrial fibrillation: Secondary | ICD-10-CM | POA: Diagnosis not present

## 2014-03-21 DIAGNOSIS — Z510 Encounter for antineoplastic radiation therapy: Secondary | ICD-10-CM | POA: Diagnosis not present

## 2014-03-21 DIAGNOSIS — F329 Major depressive disorder, single episode, unspecified: Secondary | ICD-10-CM | POA: Diagnosis not present

## 2014-03-21 DIAGNOSIS — I10 Essential (primary) hypertension: Secondary | ICD-10-CM | POA: Diagnosis not present

## 2014-03-21 DIAGNOSIS — C8331 Diffuse large B-cell lymphoma, lymph nodes of head, face, and neck: Secondary | ICD-10-CM | POA: Diagnosis not present

## 2014-03-21 DIAGNOSIS — M1990 Unspecified osteoarthritis, unspecified site: Secondary | ICD-10-CM | POA: Diagnosis not present

## 2014-03-22 DIAGNOSIS — M1990 Unspecified osteoarthritis, unspecified site: Secondary | ICD-10-CM | POA: Diagnosis not present

## 2014-03-22 DIAGNOSIS — Z510 Encounter for antineoplastic radiation therapy: Secondary | ICD-10-CM | POA: Diagnosis not present

## 2014-03-22 DIAGNOSIS — I10 Essential (primary) hypertension: Secondary | ICD-10-CM | POA: Diagnosis not present

## 2014-03-22 DIAGNOSIS — I4891 Unspecified atrial fibrillation: Secondary | ICD-10-CM | POA: Diagnosis not present

## 2014-03-22 DIAGNOSIS — C8331 Diffuse large B-cell lymphoma, lymph nodes of head, face, and neck: Secondary | ICD-10-CM | POA: Diagnosis not present

## 2014-03-22 DIAGNOSIS — F329 Major depressive disorder, single episode, unspecified: Secondary | ICD-10-CM | POA: Diagnosis not present

## 2014-03-23 DIAGNOSIS — F329 Major depressive disorder, single episode, unspecified: Secondary | ICD-10-CM | POA: Diagnosis not present

## 2014-03-23 DIAGNOSIS — I10 Essential (primary) hypertension: Secondary | ICD-10-CM | POA: Diagnosis not present

## 2014-03-23 DIAGNOSIS — I4891 Unspecified atrial fibrillation: Secondary | ICD-10-CM | POA: Diagnosis not present

## 2014-03-23 DIAGNOSIS — M1990 Unspecified osteoarthritis, unspecified site: Secondary | ICD-10-CM | POA: Diagnosis not present

## 2014-03-23 DIAGNOSIS — Z510 Encounter for antineoplastic radiation therapy: Secondary | ICD-10-CM | POA: Diagnosis not present

## 2014-03-23 DIAGNOSIS — C8331 Diffuse large B-cell lymphoma, lymph nodes of head, face, and neck: Secondary | ICD-10-CM | POA: Diagnosis not present

## 2014-03-24 DIAGNOSIS — C8331 Diffuse large B-cell lymphoma, lymph nodes of head, face, and neck: Secondary | ICD-10-CM | POA: Diagnosis not present

## 2014-03-24 DIAGNOSIS — I10 Essential (primary) hypertension: Secondary | ICD-10-CM | POA: Diagnosis not present

## 2014-03-24 DIAGNOSIS — F329 Major depressive disorder, single episode, unspecified: Secondary | ICD-10-CM | POA: Diagnosis not present

## 2014-03-24 DIAGNOSIS — M1990 Unspecified osteoarthritis, unspecified site: Secondary | ICD-10-CM | POA: Diagnosis not present

## 2014-03-24 DIAGNOSIS — I4891 Unspecified atrial fibrillation: Secondary | ICD-10-CM | POA: Diagnosis not present

## 2014-03-24 DIAGNOSIS — Z510 Encounter for antineoplastic radiation therapy: Secondary | ICD-10-CM | POA: Diagnosis not present

## 2014-03-27 ENCOUNTER — Other Ambulatory Visit (HOSPITAL_COMMUNITY): Payer: Self-pay | Admitting: Oncology

## 2014-03-27 ENCOUNTER — Ambulatory Visit (INDEPENDENT_AMBULATORY_CARE_PROVIDER_SITE_OTHER): Payer: Medicare Other | Admitting: *Deleted

## 2014-03-27 ENCOUNTER — Inpatient Hospital Stay (HOSPITAL_COMMUNITY): Payer: Medicare Other

## 2014-03-27 DIAGNOSIS — Z7901 Long term (current) use of anticoagulants: Secondary | ICD-10-CM

## 2014-03-27 DIAGNOSIS — M1990 Unspecified osteoarthritis, unspecified site: Secondary | ICD-10-CM | POA: Diagnosis not present

## 2014-03-27 DIAGNOSIS — Z510 Encounter for antineoplastic radiation therapy: Secondary | ICD-10-CM | POA: Diagnosis not present

## 2014-03-27 DIAGNOSIS — I482 Chronic atrial fibrillation, unspecified: Secondary | ICD-10-CM

## 2014-03-27 DIAGNOSIS — C8331 Diffuse large B-cell lymphoma, lymph nodes of head, face, and neck: Secondary | ICD-10-CM | POA: Diagnosis not present

## 2014-03-27 DIAGNOSIS — F329 Major depressive disorder, single episode, unspecified: Secondary | ICD-10-CM | POA: Diagnosis not present

## 2014-03-27 DIAGNOSIS — I4891 Unspecified atrial fibrillation: Secondary | ICD-10-CM | POA: Diagnosis not present

## 2014-03-27 DIAGNOSIS — Z5181 Encounter for therapeutic drug level monitoring: Secondary | ICD-10-CM

## 2014-03-27 DIAGNOSIS — I10 Essential (primary) hypertension: Secondary | ICD-10-CM | POA: Diagnosis not present

## 2014-03-27 DIAGNOSIS — C8580 Other specified types of non-Hodgkin lymphoma, unspecified site: Secondary | ICD-10-CM

## 2014-03-27 LAB — POCT INR: INR: 1.8

## 2014-03-27 MED ORDER — HYDROCODONE-ACETAMINOPHEN 5-325 MG PO TABS: 0.5000 | ORAL_TABLET | Freq: Four times a day (QID) | ORAL | Status: DC | PRN

## 2014-03-28 DIAGNOSIS — Z510 Encounter for antineoplastic radiation therapy: Secondary | ICD-10-CM | POA: Diagnosis not present

## 2014-03-28 DIAGNOSIS — C8590 Non-Hodgkin lymphoma, unspecified, unspecified site: Secondary | ICD-10-CM | POA: Diagnosis not present

## 2014-03-29 ENCOUNTER — Ambulatory Visit (HOSPITAL_COMMUNITY): Payer: Medicare Other

## 2014-03-29 DIAGNOSIS — Z510 Encounter for antineoplastic radiation therapy: Secondary | ICD-10-CM | POA: Diagnosis not present

## 2014-03-29 DIAGNOSIS — C8590 Non-Hodgkin lymphoma, unspecified, unspecified site: Secondary | ICD-10-CM | POA: Diagnosis not present

## 2014-03-30 DIAGNOSIS — Z510 Encounter for antineoplastic radiation therapy: Secondary | ICD-10-CM | POA: Diagnosis not present

## 2014-03-30 DIAGNOSIS — C8331 Diffuse large B-cell lymphoma, lymph nodes of head, face, and neck: Secondary | ICD-10-CM | POA: Diagnosis not present

## 2014-03-30 DIAGNOSIS — C8590 Non-Hodgkin lymphoma, unspecified, unspecified site: Secondary | ICD-10-CM | POA: Diagnosis not present

## 2014-03-31 DIAGNOSIS — C8590 Non-Hodgkin lymphoma, unspecified, unspecified site: Secondary | ICD-10-CM | POA: Diagnosis not present

## 2014-03-31 DIAGNOSIS — Z510 Encounter for antineoplastic radiation therapy: Secondary | ICD-10-CM | POA: Diagnosis not present

## 2014-04-03 ENCOUNTER — Other Ambulatory Visit (HOSPITAL_COMMUNITY): Payer: Self-pay | Admitting: Oncology

## 2014-04-03 DIAGNOSIS — C8590 Non-Hodgkin lymphoma, unspecified, unspecified site: Secondary | ICD-10-CM | POA: Diagnosis not present

## 2014-04-03 DIAGNOSIS — Z510 Encounter for antineoplastic radiation therapy: Secondary | ICD-10-CM | POA: Diagnosis not present

## 2014-04-04 DIAGNOSIS — C8590 Non-Hodgkin lymphoma, unspecified, unspecified site: Secondary | ICD-10-CM | POA: Diagnosis not present

## 2014-04-04 DIAGNOSIS — Z510 Encounter for antineoplastic radiation therapy: Secondary | ICD-10-CM | POA: Diagnosis not present

## 2014-04-05 ENCOUNTER — Encounter (HOSPITAL_COMMUNITY): Payer: Medicare Other

## 2014-04-05 ENCOUNTER — Encounter (HOSPITAL_COMMUNITY): Payer: Medicare Other | Attending: Hematology & Oncology | Admitting: Hematology & Oncology

## 2014-04-05 DIAGNOSIS — C8510 Unspecified B-cell lymphoma, unspecified site: Secondary | ICD-10-CM | POA: Insufficient documentation

## 2014-04-05 DIAGNOSIS — Z720 Tobacco use: Secondary | ICD-10-CM | POA: Insufficient documentation

## 2014-04-05 DIAGNOSIS — C8580 Other specified types of non-Hodgkin lymphoma, unspecified site: Secondary | ICD-10-CM

## 2014-04-05 DIAGNOSIS — Z7901 Long term (current) use of anticoagulants: Secondary | ICD-10-CM | POA: Insufficient documentation

## 2014-04-05 DIAGNOSIS — M545 Low back pain: Secondary | ICD-10-CM | POA: Diagnosis not present

## 2014-04-05 DIAGNOSIS — Z510 Encounter for antineoplastic radiation therapy: Secondary | ICD-10-CM | POA: Diagnosis not present

## 2014-04-05 DIAGNOSIS — C8331 Diffuse large B-cell lymphoma, lymph nodes of head, face, and neck: Secondary | ICD-10-CM

## 2014-04-05 DIAGNOSIS — M2550 Pain in unspecified joint: Secondary | ICD-10-CM | POA: Diagnosis not present

## 2014-04-05 DIAGNOSIS — C8590 Non-Hodgkin lymphoma, unspecified, unspecified site: Secondary | ICD-10-CM | POA: Diagnosis not present

## 2014-04-05 DIAGNOSIS — I4891 Unspecified atrial fibrillation: Secondary | ICD-10-CM | POA: Diagnosis not present

## 2014-04-05 LAB — CBC WITH DIFFERENTIAL/PLATELET
BASOS ABS: 0.1 10*3/uL (ref 0.0–0.1)
Basophils Relative: 1 % (ref 0–1)
EOS ABS: 0.6 10*3/uL (ref 0.0–0.7)
Eosinophils Relative: 8 % — ABNORMAL HIGH (ref 0–5)
HEMATOCRIT: 37.4 % (ref 36.0–46.0)
HEMOGLOBIN: 12.4 g/dL (ref 12.0–15.0)
LYMPHS PCT: 20 % (ref 12–46)
Lymphs Abs: 1.6 10*3/uL (ref 0.7–4.0)
MCH: 34.2 pg — ABNORMAL HIGH (ref 26.0–34.0)
MCHC: 33.2 g/dL (ref 30.0–36.0)
MCV: 103 fL — ABNORMAL HIGH (ref 78.0–100.0)
MONO ABS: 0.9 10*3/uL (ref 0.1–1.0)
Monocytes Relative: 12 % (ref 3–12)
NEUTROS PCT: 59 % (ref 43–77)
Neutro Abs: 4.7 10*3/uL (ref 1.7–7.7)
Platelets: 324 10*3/uL (ref 150–400)
RBC: 3.63 MIL/uL — ABNORMAL LOW (ref 3.87–5.11)
RDW: 17.6 % — ABNORMAL HIGH (ref 11.5–15.5)
WBC: 7.9 10*3/uL (ref 4.0–10.5)

## 2014-04-05 LAB — COMPREHENSIVE METABOLIC PANEL
ALT: 23 U/L (ref 0–35)
AST: 25 U/L (ref 0–37)
Albumin: 3.8 g/dL (ref 3.5–5.2)
Alkaline Phosphatase: 60 U/L (ref 39–117)
Anion gap: 6 (ref 5–15)
BILIRUBIN TOTAL: 0.5 mg/dL (ref 0.3–1.2)
BUN: 5 mg/dL — AB (ref 6–23)
CALCIUM: 8.8 mg/dL (ref 8.4–10.5)
CO2: 32 mmol/L (ref 19–32)
CREATININE: 0.61 mg/dL (ref 0.50–1.10)
Chloride: 101 mmol/L (ref 96–112)
GFR calc Af Amer: >90 mL/min (ref >90)
GFR, EST NON AFRICAN AMERICAN: 88 mL/min — AB (ref >90)
Glucose, Bld: 112 mg/dL — ABNORMAL HIGH (ref 70–99)
Potassium: 3.7 mmol/L (ref 3.5–5.1)
Sodium: 139 mmol/L (ref 135–145)
TOTAL PROTEIN: 6.8 g/dL (ref 6.0–8.3)

## 2014-04-05 LAB — LACTATE DEHYDROGENASE: LDH: 153 U/L (ref 94–250)

## 2014-04-05 MED ORDER — LIDOCAINE VISCOUS 2 % MT SOLN: OROMUCOSAL | Status: DC

## 2014-04-05 NOTE — Patient Instructions (Signed)
..  [place] at [facility] Discharge Instructions  RECOMMENDATIONS MADE BY THE CONSULTANT AND ANY TEST RESULTS WILL BE SENT TO YOUR REFERRING PHYSICIAN.  Exam good  Return in 2 months  Thank you for choosing [place] at [facility] to provide your oncology and hematology care.  To afford each patient quality time with our provider, please arrive at least 15 minutes before your scheduled appointment time.    You need to re-schedule your appointment should you arrive 10 or more minutes late.  We strive to give you quality time with our providers, and arriving late affects you and other patients whose appointments are after yours.  Also, if you no show three or more times for appointments you may be dismissed from the clinic at the providers discretion.     Again, thank you for choosing [facility].  Our hope is that these requests will decrease the amount of time that you wait before being seen by our physicians.       _____________________________________________________________  Should you have questions after your visit to [facility], please contact our office at [phone] between the hours of 8:30 a.m. and 4:30 p.m.  Voicemails left after 4:30 p.m. will not be returned until the following business day.  For prescription refill requests, have your pharmacy contact our office.

## 2014-04-05 NOTE — Progress Notes (Signed)
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  EXAM: NUCLEAR MEDICINE PET SKULL BASE TO THIGH IMPRESSION: 1. Resolution of the previously enlarged right-sided station 2 lymph node in the neck with resolution of the abnormal right vallecular activity in the neck. Currently no specific in findings of residual lymphoma. 2. Diffuse slightly heterogeneous marrow activity, probably incidental or due to marrow reactivation. 3. Bilateral upper lobe pulmonary nodules, larger on the left. The left-sided nodule has been reported back through 2004 and is not hypermetabolic, and likely benign. 4. Ancillary findings include diffuse hepatic steatosis, atherosclerosis, bilateral renal hips, severe arthropathy of the left hip, and emphysema.   Electronically Signed  By: Sherryl Barters M.D.  On: 02/06/2014 12:22  CURRENT THERAPY: Changed to RCHOP (01/02/2014) after 2 cycles of Treanda/Rituxan.  INTERVAL HISTORY: SANDE PICKERT 74 y.o. female returns today in regards to her NHL. She is currently undergoing radiation. For the most part she is tolerated it well. She does complain of a mild sore throat. She has Dukes mouthwash which does not contain lidocaine. She is requesting something to numb her throat so it is easier to swallow when she eats. She has no other major complaints or concerns today. She believes she has 5-6 radiation treatments left.   MEDICAL HISTORY: Past Medical History  Diagnosis Date  . Atrial fibrillation, chronic 2004    left atrial thrombus in 2004; near syncope in 2008; echocardiogram in 2008-normal EF, mild left atrial enlargement, no valvular abnormalities, lipomatous hypertrophy; no aortic atheroma on TEE in 2004  . Chronic anticoagulation 2004    warfarin  . Tobacco abuse     40 pack  years; quit attempt in 2012  . DJD (degenerative joint disease), lumbosacral     Also hip  . Peptic ulcer disease     s/p Billroth II  . Depression   . Fracture of wrist     Left  . Hypertension     Normal carotid ultrasound in 2008  . PONV (postoperative nausea and vomiting)   . Dysrhythmia     chronic AFib  . Cancer     has SPINAL STENOSIS; DJD (degenerative joint disease), lumbosacral; Atrial fibrillation, chronic; Chronic anticoagulation; Tobacco abuse; Peptic ulcer disease; Hypertension; Encounter for therapeutic drug monitoring; Lymphoma malignant, large cell; and Iron deficiency on her problem list.      Lymphoma malignant, large cell   09/15/2013 Initial Diagnosis Diagnosis Lymph node, biopsy, cervical left neck - DIFFUSE LARGE B CELL LYMPHOMA.   10/06/2013 PET scan Hypermetabolic thickening within the right vallecular consists with primary head and neck cancer.  Single enlarged hypermetabolic right level IIa lymph node consistent local nodal metastasis.   10/31/2013 - 11/29/2013 Chemotherapy Bendamustine/Rituxan x 2 cycles   12/19/2013 Progression PET- Progression of presumed vallecular lymphoma as evidenced by increased hypermetabolism.   12/20/2013 Treatment Plan Change Per Dr. Gari Crown, patient has a CD10 negative, non-germinal center non-Hodgkin's lymphoma.  Patient may be a candidate for additional of Revlimid to treatment plan.   12/28/2013 Echocardiogram MUGA- Normal left ventricular wall motion with calculated ejection fraction of 60%.   01/02/2014 - 02/13/2014 Chemotherapy R-CHOP with Neulasta support.   02/06/2014 PET scan Resolution of the previously enlarged right-sided station 2 lymph  node in the neck with resolution of the abnormal right vallecular  activity in the neck. Currently no specific in findings of residual  lymphoma.     is allergic to diphenhydramine hcl; estrogens; penicillins; and codeine.  Ms. Prickett does not currently have medications on file.  SURGICAL  HISTORY: Past Surgical History  Procedure Laterality Date  . Cholecystectomy  2004    [place] II; splenectomy; incidental appendectomy  . Abdominal hysterectomy      w/o oophorectomy  . Cesarean section    . Tonsillectomy    . Colonoscopy  1980s    patient denies ever having undergone colonoscopy as of 2014  . Appendectomy    . Splenectomy, total    . Lymph node biopsy Left 09/15/2013    Procedure: LEFT NECK CERVICAL NODE BIOPSY;  Surgeon: Scherry Ran, MD;  Location: [facility];  Service: General;  Laterality: Left;  . Portacath placement Right 10/21/2013    Procedure: ATTEMPTED INSERTION PORT-A-CATH;  Surgeon: Scherry Ran, MD;  Location: [facility];  Service: General;  Laterality: Right;    SOCIAL HISTORY: History   Social History  . Marital Status: Married    Spouse Name: N/A  . Number of Children: 2  . Years of Education: N/A   Occupational History  . Housecleaning    Social History Main Topics  . Smoking status: Current Every Day Smoker -- 0.25 packs/day for 55 years    Types: Cigarettes  . Smokeless tobacco: Not on file     Comment: Quit attempt in 2012  . Alcohol Use: No  . Drug Use: No  . Sexual Activity: Yes    Birth Control/ Protection: Surgical   Other Topics Concern  . Not on file   Social History Narrative   Married with one child and one adopted child, 1 stillbirth, and one deceased child at age 75 due to illness   No regular exercise    FAMILY HISTORY: Family History  Problem Relation Age of Onset  . Heart attack Mother     also brother  . Breast cancer Sister   . Heart attack Brother   . Breast cancer Sister     x2  . Cirrhosis Sister   . Cancer Brother     Gastric carcinoma; also father    Review of Systems  Constitutional: Negative for fever, chills, weight loss and malaise/fatigue.  HENT: Negative for congestion, hearing loss, nosebleeds, sore throat and tinnitus.   Eyes: Negative  for blurred vision, double vision, pain and discharge.  Respiratory: Negative for cough, hemoptysis, sputum production, shortness of breath and wheezing.   Cardiovascular: Negative for chest pain, palpitations, claudication, leg swelling and PND.  Gastrointestinal: Negative for heartburn, nausea, vomiting, abdominal pain, diarrhea, constipation, blood in stool and melena.       Mild discomfort with swallowing  Genitourinary: Negative for dysuria, urgency, frequency and hematuria.  Musculoskeletal: Positive for back pain and joint pain. Negative for myalgias and falls.  Skin: Negative for itching and rash.  Neurological: Negative for dizziness, tingling, tremors, sensory change, speech change, focal weakness, seizures, loss of consciousness, weakness and headaches.  Endo/Heme/Allergies: Does not bruise/bleed easily.  Psychiatric/Behavioral: Negative for depression, suicidal ideas, memory loss and substance abuse. The patient is not nervous/anxious and does not have insomnia.     PHYSICAL EXAMINATION  ECOG PERFORMANCE STATUS: 0 - Asymptomatic  There were no vitals filed for this visit.  Physical Exam  Constitutional: She is oriented to person, place, and time and well-developed, well-nourished, and  in no distress.  Wearing hair prosthesis  HENT:  Head: Normocephalic and atraumatic.  Mouth/Throat: Oropharynx is clear and moist. No oropharyngeal exudate.  Mild bilateral neck erythema. Oral mucosa intact with mild erythema  Eyes: Conjunctivae and EOM are normal. Pupils are equal, round, and reactive to light. Right eye exhibits no discharge. Left eye exhibits no discharge.  Neck: Normal range of motion. Neck supple. No JVD present. No thyromegaly present.  Cardiovascular: Normal rate, regular rhythm and normal heart sounds.   No murmur heard. Pulmonary/Chest: Effort normal and breath sounds normal. No respiratory distress. She has no wheezes. She has no rales. She exhibits no tenderness.    Abdominal: Soft. Bowel sounds are normal. She exhibits no distension and no mass. There is no tenderness. There is no rebound and no guarding.  Musculoskeletal: Normal range of motion. She exhibits no edema.  Lymphadenopathy:    She has no cervical adenopathy.  Neurological: She is alert and oriented to person, place, and time. She displays normal reflexes. No cranial nerve deficit. Coordination normal.  Skin: Skin is warm and dry. No rash noted. No erythema. No pallor.  Psychiatric: Mood, memory, affect and judgment normal.    LABORATORY DATA:  CBC    Component Value Date/Time   WBC 7.9 04/05/2014 1108   RBC 3.63* 04/05/2014 1108   RBC 4.84 10/10/2013 1502   HGB 12.4 04/05/2014 1108   HCT 37.4 04/05/2014 1108   PLT 324 04/05/2014 1108   MCV 103.0* 04/05/2014 1108   MCH 34.2* 04/05/2014 1108   MCHC 33.2 04/05/2014 1108   RDW 17.6* 04/05/2014 1108   LYMPHSABS 1.6 04/05/2014 1108   MONOABS 0.9 04/05/2014 1108   EOSABS 0.6 04/05/2014 1108   BASOSABS 0.1 04/05/2014 1108   CMP     Component Value Date/Time   NA 139 04/05/2014 1108   K 3.7 04/05/2014 1108   CL 101 04/05/2014 1108   CO2 32 04/05/2014 1108   GLUCOSE 112* 04/05/2014 1108   BUN 5* 04/05/2014 1108   CREATININE 0.61 04/05/2014 1108   CREATININE 0.65 06/30/2013 0840   CALCIUM 8.8 04/05/2014 1108   PROT 6.8 04/05/2014 1108   ALBUMIN 3.8 04/05/2014 1108   AST 25 04/05/2014 1108   ALT 23 04/05/2014 1108   ALKPHOS 60 04/05/2014 1108   BILITOT 0.5 04/05/2014 1108   GFRNONAA 88* 04/05/2014 1108   GFRAA >90 04/05/2014 1108     PATHOLOGY: Interpretation Tissue-Flow Cytometry - PREDOMINANCE OF B CELLS. - SEE NOTE. Diagnosis Comment: Analysis of the lymphoid population shows predominance of B cells displaying pan B cell antigens including CD20 with lack of CD5 or CD10. B cells display lambda light chain excess. The overall features are worrisome for a B cell lymphoproliferative process.  Diagnosis Lymph  node, biopsy, cervical left neck - DIFFUSE LARGE B CELL LYMPHOMA. - SEE ONCOLOGY TABLE.  CLINICAL DATA: Subsequent treatment strategy for non-Hodgkin' s lymphoma.  EXAM: NUCLEAR MEDICINE PET SKULL BASE TO THIGH  IMPRESSION: 1. Resolution of the previously enlarged right-sided station 2 lymph node in the neck with resolution of the abnormal right vallecular activity in the neck. Currently no specific in findings of residual lymphoma. 2. Diffuse slightly heterogeneous marrow activity, probably incidental or due to marrow reactivation. 3. Bilateral upper lobe pulmonary nodules, larger on the left. The left-sided nodule has been reported back through 2004 and is not hypermetabolic, and likely benign. 4. Ancillary findings include diffuse hepatic steatosis, atherosclerosis, bilateral renal hips, severe arthropathy of the left hip, and  emphysema.   Electronically Signed  By: Sherryl Barters M.D.  On: 02/06/2014 12:22   ASSESSMENT and THERAPY PLAN:    Lymphoma malignant, large cell 74 year old female with stage IE diffuse large B-cell lymphoma. She completed 3 cycles of R CHOP and is now receiving radiation. At diagnosis she was treated with Treanda and Rituxan with presumed progression of disease based upon imaging. In addition she unfortunately did not have staging bone marrow biopsy. We did do a lumbar puncture that was unremarkable.  She is doing well with treatment I prescribed her lidocaine and instructed her on how to use it in conjunction with her Dukes mouthwash. I advised her if she has additional problems prior to her next follow-up with [place] in several months she is to call.   All questions were answered. The patient knows to call the clinic with any problems, questions or concerns. We can certainly see the patient much sooner if necessary. Molli Hazard 04/07/2014

## 2014-04-06 DIAGNOSIS — Z510 Encounter for antineoplastic radiation therapy: Secondary | ICD-10-CM | POA: Diagnosis not present

## 2014-04-06 DIAGNOSIS — C8590 Non-Hodgkin lymphoma, unspecified, unspecified site: Secondary | ICD-10-CM | POA: Diagnosis not present

## 2014-04-07 ENCOUNTER — Encounter (HOSPITAL_COMMUNITY): Payer: Self-pay | Admitting: Hematology & Oncology

## 2014-04-07 DIAGNOSIS — C8331 Diffuse large B-cell lymphoma, lymph nodes of head, face, and neck: Secondary | ICD-10-CM | POA: Diagnosis not present

## 2014-04-07 DIAGNOSIS — C8590 Non-Hodgkin lymphoma, unspecified, unspecified site: Secondary | ICD-10-CM | POA: Diagnosis not present

## 2014-04-07 DIAGNOSIS — Z510 Encounter for antineoplastic radiation therapy: Secondary | ICD-10-CM | POA: Diagnosis not present

## 2014-04-07 NOTE — Assessment & Plan Note (Signed)
74 year old female with stage IE diffuse large B-cell lymphoma. She completed 3 cycles of R CHOP and is now receiving radiation. At diagnosis she was treated with Treanda and Rituxan with presumed progression of disease based upon imaging. In addition she unfortunately did not have staging bone marrow biopsy. We did do a lumbar puncture that was unremarkable.  She is doing well with treatment I prescribed her lidocaine and instructed her on how to use it in conjunction with her Dukes mouthwash. I advised her if she has additional problems prior to her next follow-up with [place] in several months she is to call.

## 2014-04-10 ENCOUNTER — Encounter: Payer: Self-pay | Admitting: *Deleted

## 2014-04-10 ENCOUNTER — Ambulatory Visit (INDEPENDENT_AMBULATORY_CARE_PROVIDER_SITE_OTHER): Payer: Medicare Other | Admitting: *Deleted

## 2014-04-10 DIAGNOSIS — Z510 Encounter for antineoplastic radiation therapy: Secondary | ICD-10-CM | POA: Diagnosis not present

## 2014-04-10 DIAGNOSIS — Z7901 Long term (current) use of anticoagulants: Secondary | ICD-10-CM | POA: Diagnosis not present

## 2014-04-10 DIAGNOSIS — I482 Chronic atrial fibrillation, unspecified: Secondary | ICD-10-CM

## 2014-04-10 DIAGNOSIS — C8590 Non-Hodgkin lymphoma, unspecified, unspecified site: Secondary | ICD-10-CM | POA: Diagnosis not present

## 2014-04-10 DIAGNOSIS — Z5181 Encounter for therapeutic drug level monitoring: Secondary | ICD-10-CM | POA: Diagnosis not present

## 2014-04-10 DIAGNOSIS — I4891 Unspecified atrial fibrillation: Secondary | ICD-10-CM | POA: Diagnosis not present

## 2014-04-10 LAB — POCT INR: INR: 1.9

## 2014-04-11 DIAGNOSIS — C8590 Non-Hodgkin lymphoma, unspecified, unspecified site: Secondary | ICD-10-CM | POA: Diagnosis not present

## 2014-04-11 DIAGNOSIS — Z510 Encounter for antineoplastic radiation therapy: Secondary | ICD-10-CM | POA: Diagnosis not present

## 2014-04-12 DIAGNOSIS — Z510 Encounter for antineoplastic radiation therapy: Secondary | ICD-10-CM | POA: Diagnosis not present

## 2014-04-12 DIAGNOSIS — C8590 Non-Hodgkin lymphoma, unspecified, unspecified site: Secondary | ICD-10-CM | POA: Diagnosis not present

## 2014-04-13 ENCOUNTER — Encounter (HOSPITAL_COMMUNITY): Payer: Self-pay | Admitting: *Deleted

## 2014-04-13 DIAGNOSIS — C8580 Other specified types of non-Hodgkin lymphoma, unspecified site: Secondary | ICD-10-CM

## 2014-04-14 ENCOUNTER — Encounter: Payer: Self-pay | Admitting: Dietician

## 2014-04-14 NOTE — Progress Notes (Signed)
Patient identified to be at risk for malnutrition, was consulted via inbasket  Contacted Pt by Phone  Wt Readings from Last 10 Encounters:  03/06/14 116 lb 8 oz (52.844 kg)  02/13/14 117 lb 3.2 oz (53.162 kg)  02/10/14 116 lb 8 oz (52.844 kg)  01/23/14 120 lb (54.432 kg)  01/06/14 116 lb 1.6 oz (52.663 kg)  01/02/14 120 lb 1.6 oz (54.477 kg)  12/20/13 120 lb 6.4 oz (54.613 kg)  11/29/13 126 lb (57.153 kg)  11/28/13 120 lb 9.6 oz (54.704 kg)  11/09/13 121 lb 4.8 oz (55.021 kg)   Patient weight has Decreased by a few pounds since the start of December  Patient reports oral intake as poor and is suffering from symptoms including sores in mouth, taste changes, dry mouth, and because of all the above, poor appetite.   Patient was very vocal about all the problems she has been dealing with. She says she has a very painful mouth which makes it difficult for her to eat as well as taste changes. On top of that she has a very dry mouth and food just sticks to the top of her mouth. She said her family is worried and she has just had to shove food down her mouth to eat. She asked for me to help in anyway I could.  She is worried about losing weight. She said she is done with treatment and is wondering when symptoms will resolve. She said she could deal with it for a week, but if it takes longer she wanted an appetite stimulant.   Went over tips to keeping mouth moist. Went over high protein/calorie foods. Pt stats ensure/boost causes her upset stomach. Reccommended protein bars.  Told her that I would check with the cancer center and check back with her on Monday to see if her symptoms have improved.  Pt seemed to be extremely grateful that I had called and said she would very much appreciate me checking up on her Monday.  Mailed my contact info, coupons, and handouts titled "Sore mouth", "Dry mouth or thick saliva", "Increasing Calories and protein" and "taste changes"   Burtis Junes RD,  LDN Nutrition Pager: [phone] 04/14/2014 5:30 PM

## 2014-04-17 ENCOUNTER — Inpatient Hospital Stay (HOSPITAL_COMMUNITY): Payer: Medicare Other

## 2014-04-17 ENCOUNTER — Encounter: Payer: Self-pay | Admitting: Dietician

## 2014-04-17 ENCOUNTER — Other Ambulatory Visit (HOSPITAL_COMMUNITY): Payer: Self-pay | Admitting: Oncology

## 2014-04-17 DIAGNOSIS — C8580 Other specified types of non-Hodgkin lymphoma, unspecified site: Secondary | ICD-10-CM

## 2014-04-17 MED ORDER — TRAMADOL HCL 50 MG PO TABS: 100.0000 mg | ORAL_TABLET | Freq: Two times a day (BID) | ORAL | Status: DC

## 2014-04-17 NOTE — Progress Notes (Signed)
Called pt to follow up with how she is tolerating oral itnake Contacted Pt by phone  Wt Readings from Last 10 Encounters:  03/06/14 116 lb 8 oz (52.844 kg)  02/13/14 117 lb 3.2 oz (53.162 kg)  02/10/14 116 lb 8 oz (52.844 kg)  01/23/14 120 lb (54.432 kg)  01/06/14 116 lb 1.6 oz (52.663 kg)  01/02/14 120 lb 1.6 oz (54.477 kg)  12/20/13 120 lb 6.4 oz (54.613 kg)  11/29/13 126 lb (57.153 kg)  11/28/13 120 lb 9.6 oz (54.704 kg)  11/09/13 121 lb 4.8 oz (55.021 kg)   Patient weight has decreased by ~8 pounds in the past month  Patient reports oral intake as fair and is still suffering from severe mouth sores and changes in taste. She mainly been eating milk/eggs and is eagerly trying to find things she can eat.   She does not think her symptoms have improved much since I talked to her Friday. I let her know the package I sent with some ideas on how to cope with her symptoms should arrive soon.   Let her know I will follow up again in a week to make sure her mouth is healing.   Burtis Junes RD, LDN Nutrition Pager: [phone] 04/17/2014 5:27 PM

## 2014-04-18 ENCOUNTER — Telehealth (HOSPITAL_COMMUNITY): Payer: Self-pay | Admitting: *Deleted

## 2014-04-18 DIAGNOSIS — M159 Polyosteoarthritis, unspecified: Secondary | ICD-10-CM | POA: Diagnosis not present

## 2014-04-18 DIAGNOSIS — Z681 Body mass index (BMI) 19 or less, adult: Secondary | ICD-10-CM | POA: Diagnosis not present

## 2014-04-18 DIAGNOSIS — F411 Generalized anxiety disorder: Secondary | ICD-10-CM | POA: Diagnosis not present

## 2014-04-18 DIAGNOSIS — R682 Dry mouth, unspecified: Secondary | ICD-10-CM | POA: Diagnosis not present

## 2014-04-18 NOTE — Telephone Encounter (Signed)
Tom asked that I call and check on Marissa Turner. I had to leave a message for patient to call me back and let me know how she was doing.

## 2014-04-19 ENCOUNTER — Ambulatory Visit (HOSPITAL_COMMUNITY): Payer: Medicare Other

## 2014-04-19 ENCOUNTER — Other Ambulatory Visit (HOSPITAL_COMMUNITY): Payer: Self-pay | Admitting: *Deleted

## 2014-04-24 ENCOUNTER — Other Ambulatory Visit (HOSPITAL_COMMUNITY): Payer: Self-pay | Admitting: Oncology

## 2014-04-24 ENCOUNTER — Ambulatory Visit (INDEPENDENT_AMBULATORY_CARE_PROVIDER_SITE_OTHER): Payer: Medicare Other | Admitting: *Deleted

## 2014-04-24 DIAGNOSIS — I4891 Unspecified atrial fibrillation: Secondary | ICD-10-CM

## 2014-04-24 DIAGNOSIS — I482 Chronic atrial fibrillation, unspecified: Secondary | ICD-10-CM

## 2014-04-24 DIAGNOSIS — Z5181 Encounter for therapeutic drug level monitoring: Secondary | ICD-10-CM | POA: Diagnosis not present

## 2014-04-24 DIAGNOSIS — Z7901 Long term (current) use of anticoagulants: Secondary | ICD-10-CM | POA: Diagnosis not present

## 2014-04-24 LAB — POCT INR: INR: 2.8

## 2014-05-11 DIAGNOSIS — Z923 Personal history of irradiation: Secondary | ICD-10-CM | POA: Diagnosis not present

## 2014-05-11 DIAGNOSIS — C8331 Diffuse large B-cell lymphoma, lymph nodes of head, face, and neck: Secondary | ICD-10-CM | POA: Diagnosis not present

## 2014-05-15 ENCOUNTER — Ambulatory Visit (INDEPENDENT_AMBULATORY_CARE_PROVIDER_SITE_OTHER): Payer: Medicare Other | Admitting: *Deleted

## 2014-05-15 DIAGNOSIS — I482 Chronic atrial fibrillation, unspecified: Secondary | ICD-10-CM

## 2014-05-15 DIAGNOSIS — Z5181 Encounter for therapeutic drug level monitoring: Secondary | ICD-10-CM | POA: Diagnosis not present

## 2014-05-15 DIAGNOSIS — I4891 Unspecified atrial fibrillation: Secondary | ICD-10-CM

## 2014-05-15 DIAGNOSIS — Z7901 Long term (current) use of anticoagulants: Secondary | ICD-10-CM

## 2014-05-15 LAB — POCT INR: INR: 3.3

## 2014-05-22 ENCOUNTER — Other Ambulatory Visit: Payer: Self-pay | Admitting: Orthopedic Surgery

## 2014-05-23 ENCOUNTER — Other Ambulatory Visit (HOSPITAL_COMMUNITY): Payer: Self-pay | Admitting: Oncology

## 2014-05-30 ENCOUNTER — Other Ambulatory Visit (HOSPITAL_COMMUNITY): Payer: Self-pay | Admitting: Oncology

## 2014-06-05 ENCOUNTER — Ambulatory Visit (INDEPENDENT_AMBULATORY_CARE_PROVIDER_SITE_OTHER): Payer: Medicare Other | Admitting: *Deleted

## 2014-06-05 DIAGNOSIS — I4891 Unspecified atrial fibrillation: Secondary | ICD-10-CM | POA: Diagnosis not present

## 2014-06-05 DIAGNOSIS — I482 Chronic atrial fibrillation, unspecified: Secondary | ICD-10-CM

## 2014-06-05 DIAGNOSIS — Z7901 Long term (current) use of anticoagulants: Secondary | ICD-10-CM | POA: Diagnosis not present

## 2014-06-05 DIAGNOSIS — Z5181 Encounter for therapeutic drug level monitoring: Secondary | ICD-10-CM

## 2014-06-05 LAB — POCT INR: INR: 2.8

## 2014-06-06 ENCOUNTER — Encounter (HOSPITAL_BASED_OUTPATIENT_CLINIC_OR_DEPARTMENT_OTHER): Payer: Medicare Other

## 2014-06-06 ENCOUNTER — Encounter (HOSPITAL_COMMUNITY): Payer: Medicare Other | Attending: Hematology and Oncology | Admitting: Hematology & Oncology

## 2014-06-06 ENCOUNTER — Encounter (HOSPITAL_COMMUNITY): Payer: Self-pay | Admitting: Hematology & Oncology

## 2014-06-06 ENCOUNTER — Encounter (HOSPITAL_COMMUNITY): Payer: Self-pay | Admitting: Lab

## 2014-06-06 VITALS — BP 117/80 | HR 72 | Temp 98.4°F | Resp 16 | Wt 104.7 lb

## 2014-06-06 DIAGNOSIS — Z90710 Acquired absence of both cervix and uterus: Secondary | ICD-10-CM | POA: Insufficient documentation

## 2014-06-06 DIAGNOSIS — F329 Major depressive disorder, single episode, unspecified: Secondary | ICD-10-CM | POA: Diagnosis not present

## 2014-06-06 DIAGNOSIS — C8580 Other specified types of non-Hodgkin lymphoma, unspecified site: Secondary | ICD-10-CM

## 2014-06-06 DIAGNOSIS — C8519 Unspecified B-cell lymphoma, extranodal and solid organ sites: Secondary | ICD-10-CM | POA: Diagnosis not present

## 2014-06-06 DIAGNOSIS — M47817 Spondylosis without myelopathy or radiculopathy, lumbosacral region: Secondary | ICD-10-CM | POA: Insufficient documentation

## 2014-06-06 DIAGNOSIS — Z9089 Acquired absence of other organs: Secondary | ICD-10-CM | POA: Insufficient documentation

## 2014-06-06 DIAGNOSIS — Z7901 Long term (current) use of anticoagulants: Secondary | ICD-10-CM | POA: Insufficient documentation

## 2014-06-06 DIAGNOSIS — E611 Iron deficiency: Secondary | ICD-10-CM | POA: Diagnosis not present

## 2014-06-06 DIAGNOSIS — F17210 Nicotine dependence, cigarettes, uncomplicated: Secondary | ICD-10-CM | POA: Diagnosis not present

## 2014-06-06 DIAGNOSIS — Z139 Encounter for screening, unspecified: Secondary | ICD-10-CM

## 2014-06-06 DIAGNOSIS — J449 Chronic obstructive pulmonary disease, unspecified: Secondary | ICD-10-CM | POA: Insufficient documentation

## 2014-06-06 DIAGNOSIS — I10 Essential (primary) hypertension: Secondary | ICD-10-CM | POA: Diagnosis not present

## 2014-06-06 DIAGNOSIS — Z980 Intestinal bypass and anastomosis status: Secondary | ICD-10-CM | POA: Diagnosis not present

## 2014-06-06 DIAGNOSIS — Z720 Tobacco use: Secondary | ICD-10-CM

## 2014-06-06 DIAGNOSIS — C8331 Diffuse large B-cell lymphoma, lymph nodes of head, face, and neck: Secondary | ICD-10-CM

## 2014-06-06 DIAGNOSIS — I482 Chronic atrial fibrillation: Secondary | ICD-10-CM | POA: Insufficient documentation

## 2014-06-06 DIAGNOSIS — Z9079 Acquired absence of other genital organ(s): Secondary | ICD-10-CM | POA: Insufficient documentation

## 2014-06-06 LAB — COMPREHENSIVE METABOLIC PANEL
ALT: 18 U/L (ref 14–54)
AST: 24 U/L (ref 15–41)
Albumin: 3.8 g/dL (ref 3.5–5.0)
Alkaline Phosphatase: 70 U/L (ref 38–126)
Anion gap: 10 (ref 5–15)
BILIRUBIN TOTAL: 0.4 mg/dL (ref 0.3–1.2)
BUN: 6 mg/dL (ref 6–20)
CALCIUM: 8.6 mg/dL — AB (ref 8.9–10.3)
CO2: 31 mmol/L (ref 22–32)
Chloride: 98 mmol/L — ABNORMAL LOW (ref 101–111)
Creatinine, Ser: 0.56 mg/dL (ref 0.44–1.00)
GFR calc Af Amer: >60 mL/min (ref >60)
GLUCOSE: 99 mg/dL (ref 70–99)
Potassium: 3.1 mmol/L — ABNORMAL LOW (ref 3.5–5.1)
SODIUM: 139 mmol/L (ref 135–145)
Total Protein: 6.6 g/dL (ref 6.5–8.1)

## 2014-06-06 LAB — CBC WITH DIFFERENTIAL/PLATELET
Basophils Absolute: 0 10*3/uL (ref 0.0–0.1)
Basophils Relative: 1 % (ref 0–1)
EOS PCT: 5 % (ref 0–5)
Eosinophils Absolute: 0.3 10*3/uL (ref 0.0–0.7)
HEMATOCRIT: 38.6 % (ref 36.0–46.0)
Hemoglobin: 12.8 g/dL (ref 12.0–15.0)
LYMPHS ABS: 2.3 10*3/uL (ref 0.7–4.0)
Lymphocytes Relative: 33 % (ref 12–46)
MCH: 33.4 pg (ref 26.0–34.0)
MCHC: 33.2 g/dL (ref 30.0–36.0)
MCV: 100.8 fL — ABNORMAL HIGH (ref 78.0–100.0)
MONO ABS: 0.7 10*3/uL (ref 0.1–1.0)
MONOS PCT: 10 % (ref 3–12)
Neutro Abs: 3.5 10*3/uL (ref 1.7–7.7)
Neutrophils Relative %: 51 % (ref 43–77)
PLATELETS: 307 10*3/uL (ref 150–400)
RBC: 3.83 MIL/uL — ABNORMAL LOW (ref 3.87–5.11)
RDW: 13.3 % (ref 11.5–15.5)
WBC: 6.8 10*3/uL (ref 4.0–10.5)

## 2014-06-06 MED ORDER — MEGESTROL ACETATE 625 MG/5ML PO SUSP: 625.0000 mg | Freq: Every day | ORAL | Status: DC

## 2014-06-06 NOTE — Patient Instructions (Signed)
[place] at [facility] Discharge Instructions  RECOMMENDATIONS MADE BY THE CONSULTANT AND ANY TEST RESULTS WILL BE SENT TO YOUR REFERRING PHYSICIAN.  Exam and discussion by Dr. Whitney Muse. You are doing well Megace for your appetite - take as directed. You can get your port removed. Mammogram to be scheduled. Call with any concerns or issues.  Follow-up in 3 months.   Thank you for choosing [place] [facility] to provide your oncology and hematology care.  To afford each patient quality time with our provider, please arrive at least 15 minutes before your scheduled appointment time.    You need to re-schedule your appointment should you arrive 10 or more minutes late.  We strive to give you quality time with our providers, and arriving late affects you and other patients whose appointments are after yours.  Also, if you no show three or more times for appointments you may be dismissed from the clinic at the providers discretion.     Again, thank you for choosing [facility].  Our hope is that these requests will decrease the amount of time that you wait before being seen by our physicians.       _____________________________________________________________  Should you have questions after your visit to [facility], please contact our office at [phone] between the hours of 8:30 a.m. and 4:30 p.m.  Voicemails left after 4:30 p.m. will not be returned until the following business day.  For prescription refill requests, have your pharmacy contact our office.

## 2014-06-06 NOTE — Progress Notes (Signed)
LABS DRAWN

## 2014-06-06 NOTE — Progress Notes (Signed)
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  EXAM: NUCLEAR MEDICINE PET SKULL BASE TO THIGH IMPRESSION: 1. Resolution of the previously enlarged right-sided station 2 lymph node in the neck with resolution of the abnormal right vallecular activity in the neck. Currently no specific in findings of residual lymphoma. 2. Diffuse slightly heterogeneous marrow activity, probably incidental or due to marrow reactivation. 3. Bilateral upper lobe pulmonary nodules, larger on the left. The left-sided nodule has been reported back through 2004 and is not hypermetabolic, and likely benign. 4. Ancillary findings include diffuse hepatic steatosis, atherosclerosis, bilateral renal hips, severe arthropathy of the left hip, and emphysema.   Electronically Signed  By: Sherryl Barters M.D.  On: 02/06/2014 12:22  RCHOP X 3 XRT  CURRENT THERAPY: Observation  INTERVAL HISTORY: Marissa Turner 74 y.o. female returns today in regards to her NHL. She finished radiation treatment. For the most part she tolerated it well.   She has no major complaints or concerns today. Her appetite varies. She occasionally eats well. She would like something to improve her appetite. Since completion Her mood is irritable. She reports depression because this is the anniversary of one of her son's deaths.   She denies any masses and no B symptoms.    Current Outpatient Prescriptions on File Prior to Visit  Medication Sig Dispense Refill  . acetaminophen (TYLENOL) 500 MG tablet Take 1,000 mg by mouth daily as needed for headache.    [name] acyclovir (ZOVIRAX) 400 MG tablet Take 1 tablet (400 mg total) by mouth daily. 30 tablet 3  . allopurinol (ZYLOPRIM) 300 MG tablet TAKE ONE (1) TABLET EACH DAY 30 tablet 1  . ALPRAZolam (XANAX) 1 MG  tablet TAKE ONE (1) TABLET THREE (3) TIMES EACHDAY ASN NEEDED FOR ANXIETY 90 tablet 2  . cetirizine (ZYRTEC) 10 MG tablet Take 10 mg by mouth daily.      . citalopram (CELEXA) 20 MG tablet TAKE ONE (1) TABLET BY MOUTH EVERY DAY 30 tablet 2  . digoxin (LANOXIN) 0.125 MG tablet Take 125 mcg by mouth daily.     [name] diltiazem (DILACOR XR) 120 MG 24 hr capsule Take 120 mg by mouth daily.    [name] gabapentin (NEURONTIN) 100 MG capsule Take 1 capsule (100 mg total) by mouth 2 (two) times daily. 60 capsule 5  . HYDROcodone-acetaminophen (NORCO/VICODIN) 5-325 MG per tablet Take 0.5-1 tablets by mouth every 6 (six) hours as needed for moderate pain. 30 tablet 0  . lidocaine (XYLOCAINE) 2 % solution Add 5 mls to your dukes mouthwash and swish and swallow for mouth pain. Use up to 4 times daily 100 mL 2  . metoprolol (TOPROL-XL) 25 MG 24 hr tablet Take 12.5 mg by mouth 2 (two) times daily.     [name] warfarin (COUMADIN) 5 MG tablet Take 1 tablet daily except 1/2 tablet on Sundays or as directed 30 tablet 4  . Diphenhyd-Hydrocort-Nystatin (FIRST-DUKES MOUTHWASH) SUSP Use as directed 15 mLs in the mouth or throat 4 (four) times daily as needed. (Patient not taking: Reported on 06/06/2014) 300 mL 2  . lidocaine-prilocaine (EMLA) cream Apply a quarter size amount to port site 1 hour prior to chemo. Do not rub in. Cover with plastic wrap. (Patient not taking: Reported on 06/06/2014) 60 g 2  . ondansetron (  ZOFRAN) 8 MG tablet Take 1 tablet (8 mg total) by mouth every 8 (eight) hours as needed for nausea or vomiting. (Patient not taking: Reported on 04/05/2014) 30 tablet 1  . potassium chloride SA (K-DUR,KLOR-CON) 20 MEQ tablet Take 1 tablet (20 mEq total) by mouth 2 (two) times daily. (Patient not taking: Reported on 04/05/2014) 60 tablet 1  . prochlorperazine (COMPAZINE) 10 MG tablet Take 1 tablet (10 mg total) by mouth every 6 (six) hours as needed for nausea or vomiting. (Patient not taking: Reported on 04/05/2014) 60 tablet 2  .  traMADol (ULTRAM) 50 MG tablet Take 2 tablets (100 mg total) by mouth 2 (two) times daily. (Patient not taking: Reported on 06/06/2014) 60 tablet 2   No current facility-administered medications on file prior to visit.    MEDICAL HISTORY: Past Medical History  Diagnosis Date  . Atrial fibrillation, chronic 2004    left atrial thrombus in 2004; near syncope in 2008; echocardiogram in 2008-normal EF, mild left atrial enlargement, no valvular abnormalities, lipomatous hypertrophy; no aortic atheroma on TEE in 2004  . Chronic anticoagulation 2004    warfarin  . Tobacco abuse     40 pack years; quit attempt in 2012  . DJD (degenerative joint disease), lumbosacral     Also hip  . Peptic ulcer disease     s/p [place] II  . Depression   . Fracture of wrist     Left  . Hypertension     Normal carotid ultrasound in 2008  . PONV (postoperative nausea and vomiting)   . Dysrhythmia     chronic AFib  . Cancer     has SPINAL STENOSIS; DJD (degenerative joint disease), lumbosacral; Atrial fibrillation, chronic; Chronic anticoagulation; Tobacco abuse; Peptic ulcer disease; Hypertension; Encounter for therapeutic drug monitoring; Lymphoma malignant, large cell; and Iron deficiency on her problem list.      Lymphoma malignant, large cell   09/15/2013 Initial Diagnosis Diagnosis Lymph node, biopsy, cervical left neck - DIFFUSE LARGE B CELL LYMPHOMA.   10/06/2013 PET scan Hypermetabolic thickening within the right vallecular consists with primary head and neck cancer.  Single enlarged hypermetabolic right level IIa lymph node consistent local nodal metastasis.   10/31/2013 - 11/29/2013 Chemotherapy Bendamustine/Rituxan x 2 cycles   12/19/2013 Progression PET- Progression of presumed vallecular lymphoma as evidenced by increased hypermetabolism.   12/20/2013 Treatment Plan Change Per Dr. Gari Crown, patient has a CD10 negative, non-germinal center non-Hodgkin's lymphoma.  Patient may be a candidate for  additional of Revlimid to treatment plan.   12/28/2013 Echocardiogram MUGA- Normal left ventricular wall motion with calculated ejection fraction of 60%.   01/02/2014 - 02/13/2014 Chemotherapy R-CHOP with Neulasta support.   02/06/2014 PET scan Resolution of the previously enlarged right-sided station 2 lymph  node in the neck with resolution of the abnormal right vallecular  activity in the neck. Currently no specific in findings of residual  lymphoma.    - 04/12/2014 Radiation Therapy Dr. Lisbeth Renshaw     is allergic to diphenhydramine hcl; estrogens; penicillins; and codeine.  Marissa Turner does not currently have medications on file.  SURGICAL HISTORY: Past Surgical History  Procedure Laterality Date  . Cholecystectomy  2004    [place] II; splenectomy; incidental appendectomy  . Abdominal hysterectomy      w/o oophorectomy  . Cesarean section    . Tonsillectomy    . Colonoscopy  1980s    patient denies ever having undergone colonoscopy  as of 2014  . Appendectomy    . Splenectomy, total    . Lymph node biopsy Left 09/15/2013    Procedure: LEFT NECK CERVICAL NODE BIOPSY;  Surgeon: Scherry Ran, MD;  Location: [facility];  Service: General;  Laterality: Left;  . Portacath placement Right 10/21/2013    Procedure: ATTEMPTED INSERTION PORT-A-CATH;  Surgeon: Scherry Ran, MD;  Location: [facility];  Service: General;  Laterality: Right;    SOCIAL HISTORY: History   Social History  . Marital Status: Married    Spouse Name: N/A  . Number of Children: 2  . Years of Education: N/A   Occupational History  . Housecleaning    Social History Main Topics  . Smoking status: Current Every Day Smoker -- 0.25 packs/day for 55 years    Types: Cigarettes  . Smokeless tobacco: Not on file     Comment: Quit attempt in 2012  . Alcohol Use: No  . Drug Use: No  . Sexual Activity: Yes    Birth Control/ Protection: Surgical   Other Topics Concern  .  Not on file   Social History Narrative   Married with one child and one adopted child, 1 stillbirth, and one deceased child at age 56 due to illness   No regular exercise  2 of her children died. Has 1 living son.  FAMILY HISTORY: Family History  Problem Relation Age of Onset  . Heart attack Mother     also brother  . Breast cancer Sister   . Heart attack Brother   . Breast cancer Sister     x2  . Cirrhosis Sister   . Cancer Brother     Gastric carcinoma; also father    Review of Systems  Constitutional: Negative for fever, chills, weight loss and malaise/fatigue.  HENT: Negative for congestion, hearing loss, nosebleeds, sore throat and tinnitus.   Eyes: Negative for blurred vision, double vision, pain and discharge.  Respiratory: Negative for cough, hemoptysis, sputum production, shortness of breath and wheezing.   Cardiovascular: Negative for chest pain, palpitations, claudication, leg swelling and PND.  Gastrointestinal: Negative for heartburn, nausea, vomiting, abdominal pain, diarrhea, constipation, blood in stool and melena.  Genitourinary: Negative for dysuria, urgency, frequency and hematuria.  Musculoskeletal: Positive for knee pain  Skin: Negative for itching and rash.  Neurological: Negative for dizziness, tingling, tremors, sensory change, speech change, focal weakness, seizures, loss of consciousness, weakness and headaches.  Endo/Heme/Allergies: Does not bruise/bleed easily.  Psychiatric/Behavioral: Negative for depression, suicidal ideas, memory loss and substance abuse. The patient is not nervous/anxious and does not have insomnia.     PHYSICAL EXAMINATION  ECOG PERFORMANCE STATUS: 0 - Asymptomatic  Filed Vitals:   06/06/14 1124  BP: 117/80  Pulse: 72  Temp: 98.4 F (36.9 C)  Resp: 16    Physical Exam  Constitutional: She is oriented to person, place, and time and well-developed, well-nourished, and in no distress.  Wearing hair prosthesis  HENT:    Head: Normocephalic and atraumatic.  Mouth/Throat: Oropharynx is clear and moist. No oropharyngeal exudate.  Eyes: Conjunctivae and EOM are normal. Pupils are equal, round, and reactive to light. Right eye exhibits no discharge. Left eye exhibits no discharge.  Neck: Normal range of motion. Neck supple. No JVD present. No thyromegaly present.  Cardiovascular: Normal rate, regular rhythm and normal heart sounds.   No murmur heard. Pulmonary/Chest: Effort normal and breath sounds normal. No respiratory distress. She has no wheezes. She has no rales. She exhibits no  tenderness.  Abdominal: Soft. Bowel sounds are normal. She exhibits no distension and no mass. There is no tenderness. There is no rebound and no guarding.  Musculoskeletal: Normal range of motion. She exhibits no edema. Abrasion on right knee. Lymphadenopathy:    She has no cervical adenopathy.  Neurological: She is alert and oriented to person, place, and time. She displays normal reflexes. No cranial nerve deficit. Coordination normal.  Skin: Skin is warm and dry. No rash noted. No erythema. No pallor.  Psychiatric: Mood, memory, affect and judgment normal.    LABORATORY DATA:  CBC    Component Value Date/Time   WBC 6.8 06/06/2014 1110   RBC 3.83* 06/06/2014 1110   RBC 4.84 10/10/2013 1502   HGB 12.8 06/06/2014 1110   HCT 38.6 06/06/2014 1110   PLT 307 06/06/2014 1110   MCV 100.8* 06/06/2014 1110   MCH 33.4 06/06/2014 1110   MCHC 33.2 06/06/2014 1110   RDW 13.3 06/06/2014 1110   LYMPHSABS 2.3 06/06/2014 1110   MONOABS 0.7 06/06/2014 1110   EOSABS 0.3 06/06/2014 1110   BASOSABS 0.0 06/06/2014 1110   CMP     Component Value Date/Time   NA 139 04/05/2014 1108   K 3.7 04/05/2014 1108   CL 101 04/05/2014 1108   CO2 32 04/05/2014 1108   GLUCOSE 112* 04/05/2014 1108   BUN 5* 04/05/2014 1108   CREATININE 0.61 04/05/2014 1108   CREATININE 0.65 06/30/2013 0840   CALCIUM 8.8 04/05/2014 1108   PROT 6.8 04/05/2014  1108   ALBUMIN 3.8 04/05/2014 1108   AST 25 04/05/2014 1108   ALT 23 04/05/2014 1108   ALKPHOS 60 04/05/2014 1108   BILITOT 0.5 04/05/2014 1108   GFRNONAA 88* 04/05/2014 1108   GFRAA >90 04/05/2014 1108     PATHOLOGY: Interpretation Tissue-Flow Cytometry - PREDOMINANCE OF B CELLS. - SEE NOTE. Diagnosis Comment: Analysis of the lymphoid population shows predominance of B cells displaying pan B cell antigens including CD20 with lack of CD5 or CD10. B cells display lambda light chain excess. The overall features are worrisome for a B cell lymphoproliferative process.  Diagnosis Lymph node, biopsy, cervical left neck - DIFFUSE LARGE B CELL LYMPHOMA. - SEE ONCOLOGY TABLE.  CLINICAL DATA: Subsequent treatment strategy for non-Hodgkin' s lymphoma.  EXAM: NUCLEAR MEDICINE PET SKULL BASE TO THIGH  IMPRESSION: 1. Resolution of the previously enlarged right-sided station 2 lymph node in the neck with resolution of the abnormal right vallecular activity in the neck. Currently no specific in findings of residual lymphoma. 2. Diffuse slightly heterogeneous marrow activity, probably incidental or due to marrow reactivation. 3. Bilateral upper lobe pulmonary nodules, larger on the left. The left-sided nodule has been reported back through 2004 and is not hypermetabolic, and likely benign. 4. Ancillary findings include diffuse hepatic steatosis, atherosclerosis, bilateral renal hips, severe arthropathy of the left hip, and emphysema.   Electronically Signed  By: Sherryl Barters M.D.  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  We will give her a prescription for Megace to see if it improves her appetite. She would like to have her Port-A-Cath removed and we will arrange this for her. She states that it is causing her  discomfort and occasionally wakes her up at night if she lays on it.  She has not had a screening mammogram and  agrees to let us set her up for one. Plan on seeing her back in 3 months with repeat laboratory studies. She is any interim problems or concerns she knows to give [place] a call. She currently remains without evidence of recurrence.  Again addressed smoking cessation. She is trying to cut back and is down to one half pack per day.  All questions were answered. The patient knows to call the clinic with any problems, questions or concerns. We can certainly see the patient much sooner if necessary.  Will prescribe medication to improve appetite. Recommend antibiotic ointment for knee abrasion. Recommend smoking cessation. Ordered mammogram.  This document serves as a record of services personally performed by Ancil Linsey, MD. It was created on her behalf by Pearlie Oyster, a trained medical scribe. The creation of this record is based on the scribe's personal observations and the provider's statements to them. This document has been checked and approved by the attending provider.    I have reviewed the above documentation for accuracy and completeness, and I agree with the above.  Kelby Fam. Nirvana Blanchett MD

## 2014-06-06 NOTE — Progress Notes (Signed)
Referral sent to Dr Arnoldo Morale for port removal.  Appointment is set for 5/31 at 1115.  Patient aware of appointment.

## 2014-06-09 ENCOUNTER — Encounter: Payer: Self-pay | Admitting: Dietician

## 2014-06-09 NOTE — Progress Notes (Signed)
Called to follow up with pt in light of, what appeared to be, further weight loss  Contacted Pt by Phone   Wt Readings from Last 10 Encounters:  06/06/14 104 lb 11.2 oz (47.492 kg)  03/06/14 116 lb 8 oz (52.844 kg)  02/13/14 117 lb 3.2 oz (53.162 kg)  02/10/14 116 lb 8 oz (52.844 kg)  01/23/14 120 lb (54.432 kg)  01/06/14 116 lb 1.6 oz (52.663 kg)  01/02/14 120 lb 1.6 oz (54.477 kg)  12/20/13 120 lb 6.4 oz (54.613 kg)  11/29/13 126 lb (57.153 kg)  11/28/13 120 lb 9.6 oz (54.704 kg)   Patient weight has decreased by 12 lbs between February and now.   Patient reports oral intake as fair and improving and is suffering from symptoms including: a slightly sore mouth  Pt has asked for an appetite stimulant because she didn't have a great appetite. However, she could not afford it. Fortunately, her appetite has gotten better over the past few days. She eats 2 meals and snacks. Denies n/v/c/d. Still reports having a sore mouth when eating tomato sauce.   She thinks her weight loss came from when she had an extremely sore mouth an not from recently. She says 105 lbs is her "normal weight" and is not looking to gain any.  She asked about vitamins. I reccommended she take a one a day mvi with minerals to cover all her bases.   Pt seemed to be doing much better since I last talked to her.   No further nutrition interventions needed at this time.   Burtis Junes RD, LDN Nutrition Pager: [phone] 06/09/2014 11:24 AM

## 2014-06-12 ENCOUNTER — Telehealth (HOSPITAL_COMMUNITY): Payer: Self-pay | Admitting: *Deleted

## 2014-06-12 DIAGNOSIS — E876 Hypokalemia: Secondary | ICD-10-CM

## 2014-06-12 MED ORDER — POTASSIUM CHLORIDE CRYS ER 20 MEQ PO TBCR: 20.0000 meq | EXTENDED_RELEASE_TABLET | Freq: Two times a day (BID) | ORAL | Status: DC

## 2014-06-12 NOTE — Telephone Encounter (Signed)
Reordered potassium and patient notified to restart

## 2014-06-27 DIAGNOSIS — C8580 Other specified types of non-Hodgkin lymphoma, unspecified site: Secondary | ICD-10-CM | POA: Diagnosis not present

## 2014-06-28 ENCOUNTER — Other Ambulatory Visit (HOSPITAL_COMMUNITY): Payer: Self-pay | Admitting: Oncology

## 2014-06-28 ENCOUNTER — Ambulatory Visit (INDEPENDENT_AMBULATORY_CARE_PROVIDER_SITE_OTHER): Payer: Medicare Other | Admitting: *Deleted

## 2014-06-28 DIAGNOSIS — Z5181 Encounter for therapeutic drug level monitoring: Secondary | ICD-10-CM | POA: Diagnosis not present

## 2014-06-28 DIAGNOSIS — Z7901 Long term (current) use of anticoagulants: Secondary | ICD-10-CM | POA: Diagnosis not present

## 2014-06-28 DIAGNOSIS — I4891 Unspecified atrial fibrillation: Secondary | ICD-10-CM

## 2014-06-28 DIAGNOSIS — I482 Chronic atrial fibrillation, unspecified: Secondary | ICD-10-CM

## 2014-06-28 LAB — POCT INR: INR: 2.6

## 2014-07-03 ENCOUNTER — Telehealth (HOSPITAL_COMMUNITY): Payer: Self-pay

## 2014-07-03 ENCOUNTER — Other Ambulatory Visit (HOSPITAL_COMMUNITY): Payer: Self-pay | Admitting: Oncology

## 2014-07-03 NOTE — Telephone Encounter (Signed)
Call from patient questioning if she needs to take 3 medications that she requested refills at [facility] and refills were denied.  Does not know what the meds were and when [place] was contacted they were only aware of the acyclovir.  Discussed with Robynn Pane, PA -C and he denied the Acyclovir and Allopurinol because they were no longer need.  Patient notified and instructed to call if she can remember what the 3rd medication is.  Verbalized understanding of instructions.

## 2014-07-05 NOTE — H&P (Signed)
  NTS SOAP Note  Vital Signs:  Vitals as of: 9/75/8832: Systolic 97: Diastolic 61: Heart Rate 62: Temp 98.81F: Height 72f 0in: Weight 101Lbs 0 Ounces: BMI 19.73  BMI : 19.73 kg/m2  Subjective: This 74year old female presents for of portacath removal.  Is finished with chemotherapy and is referred for port removal.  Review of Symptoms:    Past Medical History:  Reviewed  Past Medical History  Surgical History: cholecystectomy, gastrojejunostomy, TAH, appendectomy, splenectomy Medical Problems: lymphoma, HTN, atrial fibrillation, DJD Allergies: nkda Medications: acyclovir, allopurinol, alprazolam, diltiazem, gabapentin, lanoxin, metoprolol, zofran, coumadin, compazine   Social History:Reviewed  Social History  Preferred Language: English Race:  White Ethnicity: Not Hispanic / Latino Age: [age]year Marital Status:  M   Smoking Status: Light tobacco smoker reviewed on 06/27/2014 Started Date:  Packs per week:  Functional Status reviewed on 06/27/2014 ------------------------------------------------ Bathing: Normal Cooking: Normal Dressing: Normal Driving: Normal Eating: Normal Managing Meds: Normal Oral Care: Normal Shopping: Normal Toileting: Normal Transferring: Normal Walking: Normal Cognitive Status reviewed on 06/27/2014 ------------------------------------------------ Attention: Normal Decision Making: Normal Language: Normal Memory: Normal Motor: Normal Perception: Normal Problem Solving: Normal Visual and Spatial: Normal   Family History:Reviewed  Family Health History Mother, Deceased; Heart attack (myocardial infarction);  Father     Objective Information: General:Well appearing, well nourished in no distress. Iregularly irregular Lungs:  CTA bilaterally, no wheezes, rhonchi, rales.  Breathing unlabored.  Port in place right chest.  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  To hold coumadin on 07/08/14.   Patient Education:Alternative treatments to surgery were discussed with patient (and family).  Risks and benefits  of procedure were fully explained to the patient (and family) who gave informed consent. Patient/family questions were addressed.  Follow-up:Pending Surgery

## 2014-07-12 ENCOUNTER — Encounter (HOSPITAL_COMMUNITY): Payer: Self-pay | Admitting: *Deleted

## 2014-07-12 ENCOUNTER — Ambulatory Visit (HOSPITAL_COMMUNITY)
Admission: RE | Admit: 2014-07-12 | Discharge: 2014-07-12 | Disposition: A | Discharge status: Home / Self Care | Payer: Medicare Other | Source: Ambulatory Visit | Attending: General Surgery | Admitting: General Surgery

## 2014-07-12 ENCOUNTER — Encounter (HOSPITAL_COMMUNITY)
Admission: RE | Disposition: A | Discharge status: Home / Self Care | Payer: Self-pay | Source: Ambulatory Visit | Attending: General Surgery

## 2014-07-12 DIAGNOSIS — I10 Essential (primary) hypertension: Secondary | ICD-10-CM | POA: Insufficient documentation

## 2014-07-12 DIAGNOSIS — I4891 Unspecified atrial fibrillation: Secondary | ICD-10-CM | POA: Insufficient documentation

## 2014-07-12 DIAGNOSIS — C8580 Other specified types of non-Hodgkin lymphoma, unspecified site: Secondary | ICD-10-CM | POA: Diagnosis not present

## 2014-07-12 DIAGNOSIS — F17200 Nicotine dependence, unspecified, uncomplicated: Secondary | ICD-10-CM | POA: Diagnosis not present

## 2014-07-12 DIAGNOSIS — Z452 Encounter for adjustment and management of vascular access device: Secondary | ICD-10-CM | POA: Insufficient documentation

## 2014-07-12 DIAGNOSIS — M1990 Unspecified osteoarthritis, unspecified site: Secondary | ICD-10-CM | POA: Insufficient documentation

## 2014-07-12 DIAGNOSIS — Z9221 Personal history of antineoplastic chemotherapy: Secondary | ICD-10-CM | POA: Insufficient documentation

## 2014-07-12 DIAGNOSIS — Z7901 Long term (current) use of anticoagulants: Secondary | ICD-10-CM | POA: Insufficient documentation

## 2014-07-12 DIAGNOSIS — Z8572 Personal history of non-Hodgkin lymphomas: Secondary | ICD-10-CM | POA: Insufficient documentation

## 2014-07-12 DIAGNOSIS — Z79899 Other long term (current) drug therapy: Secondary | ICD-10-CM | POA: Insufficient documentation

## 2014-07-12 HISTORY — PX: PORT-A-CATH REMOVAL: SHX5289

## 2014-07-12 SURGERY — REMOVAL PORT-A-CATH
Anesthesia: LOCAL | Site: Chest | Laterality: Right

## 2014-07-12 MED ORDER — LIDOCAINE HCL (PF) 1 % IJ SOLN
INTRAMUSCULAR | Status: DC | PRN
  Administered 2014-07-12: 5 mL

## 2014-07-12 MED ORDER — LIDOCAINE HCL (PF) 1 % IJ SOLN
INTRAMUSCULAR | Status: AC
  Filled 2014-07-12: qty 30

## 2014-07-12 SURGICAL SUPPLY — 12 items
CLOTH BEACON ORANGE TIMEOUT ST (SAFETY) ×2 IMPLANT
DRAPE PROXIMA HALF (DRAPES) ×2 IMPLANT
DURAPREP 6ML APPLICATOR 50/CS (WOUND CARE) ×2 IMPLANT
ELECT REM PT RETURN 9FT ADLT (ELECTROSURGICAL) ×2
ELECTRODE REM PT RTRN 9FT ADLT (ELECTROSURGICAL) ×1 IMPLANT
GLOVE SURG SS PI 7.5 STRL IVOR (GLOVE) ×2 IMPLANT
GOWN STRL REUS W/TWL LRG LVL3 (GOWN DISPOSABLE) ×2 IMPLANT
LIQUID BAND (GAUZE/BANDAGES/DRESSINGS) ×2 IMPLANT
SPONGE GAUZE 2X2 8PLY STRL LF (GAUZE/BANDAGES/DRESSINGS) ×2 IMPLANT
SUT VIC AB 3-0 SH 27 (SUTURE) ×1
SUT VIC AB 3-0 SH 27X BRD (SUTURE) ×1 IMPLANT
SUT VIC AB 4-0 PS2 27 (SUTURE) ×2 IMPLANT

## 2014-07-12 NOTE — Op Note (Signed)
Patient:  Marissa Turner  DOB:  1940-11-15  MRN:  [id number]   Preop Diagnosis:  Lymphoma, finished with chemotherapy  Postop Diagnosis:  Same  Procedure:  Port-A-Cath removal  Surgeon:  Aviva Signs, M.D.  Anes:  Local  Indications:  Patient is a 74 year old white female who is finished with chemotherapy through a Port-A-Cath for treatment of lymphoma. Patient now presents for Port-A-Cath removal. The risks and benefits of the procedure were fully explained to the patient, who gave informed consent. She has stopped her Coumadin since 07/08/2014.  Procedure note:  Patient was placed in supine position. The right upper chest was prepped and draped using usual sterile technique with DuraPrep. Surgical site confirmation was performed. 1% Xylocaine was used for local anesthesia.  An incision was made through the previous surgical incision site. The dissection was taken down to the port the port was removed in total without difficulty. It was disposed of. The subcutaneous layer was reapproximated using a 3-0 Vicryl interrupted suture. The skin was closed using a 4-0 Vicryl subcuticular suture. Liquiband was then applied.  All tape and needle counts were correct at the end of the procedure. Patient was then discharged from the minor procedure room in good and stable condition.  Complications:  None  EBL:  Minimal  Specimen:  None

## 2014-07-12 NOTE — Interval H&P Note (Signed)
History and Physical Interval Note:  07/12/2014 10:17 AM  Marissa Turner  has presented today for surgery, with the diagnosis of nodular lymphoma  The various methods of treatment have been discussed with the patient and family. After consideration of risks, benefits and other options for treatment, the patient has consented to  Procedure(s): REMOVAL PORT-A-CATH (Right) as a surgical intervention .  The patient's history has been reviewed, patient examined, no change in status, stable for surgery.  I have reviewed the patient's chart and labs.  Questions were answered to the patient's satisfaction.     Aviva Signs A

## 2014-07-13 ENCOUNTER — Encounter (HOSPITAL_COMMUNITY): Payer: Self-pay | Admitting: General Surgery

## 2014-07-17 ENCOUNTER — Ambulatory Visit (HOSPITAL_COMMUNITY): Payer: Medicare Other

## 2014-07-26 ENCOUNTER — Ambulatory Visit (INDEPENDENT_AMBULATORY_CARE_PROVIDER_SITE_OTHER): Payer: Medicare Other | Admitting: *Deleted

## 2014-07-26 DIAGNOSIS — Z7901 Long term (current) use of anticoagulants: Secondary | ICD-10-CM | POA: Diagnosis not present

## 2014-07-26 DIAGNOSIS — Z5181 Encounter for therapeutic drug level monitoring: Secondary | ICD-10-CM | POA: Diagnosis not present

## 2014-07-26 DIAGNOSIS — I482 Chronic atrial fibrillation, unspecified: Secondary | ICD-10-CM

## 2014-07-26 DIAGNOSIS — I4891 Unspecified atrial fibrillation: Secondary | ICD-10-CM | POA: Diagnosis not present

## 2014-07-26 LAB — POCT INR: INR: 3.4

## 2014-08-14 ENCOUNTER — Ambulatory Visit (INDEPENDENT_AMBULATORY_CARE_PROVIDER_SITE_OTHER): Payer: Medicare Other | Admitting: *Deleted

## 2014-08-14 DIAGNOSIS — I482 Chronic atrial fibrillation, unspecified: Secondary | ICD-10-CM

## 2014-08-14 DIAGNOSIS — Z7901 Long term (current) use of anticoagulants: Secondary | ICD-10-CM | POA: Diagnosis not present

## 2014-08-14 DIAGNOSIS — Z5181 Encounter for therapeutic drug level monitoring: Secondary | ICD-10-CM | POA: Diagnosis not present

## 2014-08-14 DIAGNOSIS — Z79899 Other long term (current) drug therapy: Secondary | ICD-10-CM | POA: Diagnosis not present

## 2014-08-14 DIAGNOSIS — I4891 Unspecified atrial fibrillation: Secondary | ICD-10-CM

## 2014-08-14 DIAGNOSIS — Z0000 Encounter for general adult medical examination without abnormal findings: Secondary | ICD-10-CM | POA: Diagnosis not present

## 2014-08-14 LAB — POCT INR: INR: 5.1

## 2014-08-15 ENCOUNTER — Other Ambulatory Visit (HOSPITAL_COMMUNITY): Payer: Self-pay | Admitting: Hematology & Oncology

## 2014-08-16 DIAGNOSIS — C8590 Non-Hodgkin lymphoma, unspecified, unspecified site: Secondary | ICD-10-CM | POA: Diagnosis not present

## 2014-08-16 DIAGNOSIS — M069 Rheumatoid arthritis, unspecified: Secondary | ICD-10-CM | POA: Diagnosis not present

## 2014-08-16 DIAGNOSIS — I482 Chronic atrial fibrillation: Secondary | ICD-10-CM | POA: Diagnosis not present

## 2014-08-16 DIAGNOSIS — F419 Anxiety disorder, unspecified: Secondary | ICD-10-CM | POA: Diagnosis not present

## 2014-08-16 DIAGNOSIS — I10 Essential (primary) hypertension: Secondary | ICD-10-CM | POA: Diagnosis not present

## 2014-08-21 ENCOUNTER — Other Ambulatory Visit: Payer: Self-pay | Admitting: Cardiovascular Disease

## 2014-08-24 ENCOUNTER — Emergency Department (HOSPITAL_COMMUNITY)
Admission: EM | Admit: 2014-08-24 | Discharge: 2014-08-24 | Disposition: A | Discharge status: Home / Self Care | Payer: Medicare Other | Attending: Emergency Medicine | Admitting: Emergency Medicine

## 2014-08-24 ENCOUNTER — Emergency Department (HOSPITAL_COMMUNITY): Payer: Medicare Other

## 2014-08-24 ENCOUNTER — Encounter (HOSPITAL_COMMUNITY): Payer: Self-pay

## 2014-08-24 DIAGNOSIS — Z720 Tobacco use: Secondary | ICD-10-CM | POA: Diagnosis not present

## 2014-08-24 DIAGNOSIS — S92325A Nondisplaced fracture of second metatarsal bone, left foot, initial encounter for closed fracture: Secondary | ICD-10-CM | POA: Insufficient documentation

## 2014-08-24 DIAGNOSIS — Z880 Allergy status to penicillin: Secondary | ICD-10-CM | POA: Diagnosis not present

## 2014-08-24 DIAGNOSIS — S8991XA Unspecified injury of right lower leg, initial encounter: Secondary | ICD-10-CM | POA: Insufficient documentation

## 2014-08-24 DIAGNOSIS — Y93E5 Activity, floor mopping and cleaning: Secondary | ICD-10-CM | POA: Diagnosis not present

## 2014-08-24 DIAGNOSIS — W208XXA Other cause of strike by thrown, projected or falling object, initial encounter: Secondary | ICD-10-CM | POA: Insufficient documentation

## 2014-08-24 DIAGNOSIS — Z79899 Other long term (current) drug therapy: Secondary | ICD-10-CM | POA: Insufficient documentation

## 2014-08-24 DIAGNOSIS — Z7901 Long term (current) use of anticoagulants: Secondary | ICD-10-CM | POA: Diagnosis not present

## 2014-08-24 DIAGNOSIS — Y9222 Religious institution as the place of occurrence of the external cause: Secondary | ICD-10-CM | POA: Diagnosis not present

## 2014-08-24 DIAGNOSIS — I10 Essential (primary) hypertension: Secondary | ICD-10-CM | POA: Diagnosis not present

## 2014-08-24 DIAGNOSIS — Y998 Other external cause status: Secondary | ICD-10-CM | POA: Diagnosis not present

## 2014-08-24 DIAGNOSIS — Z859 Personal history of malignant neoplasm, unspecified: Secondary | ICD-10-CM | POA: Insufficient documentation

## 2014-08-24 DIAGNOSIS — Z8711 Personal history of peptic ulcer disease: Secondary | ICD-10-CM | POA: Diagnosis not present

## 2014-08-24 DIAGNOSIS — S92311A Displaced fracture of first metatarsal bone, right foot, initial encounter for closed fracture: Secondary | ICD-10-CM | POA: Insufficient documentation

## 2014-08-24 DIAGNOSIS — S8001XA Contusion of right knee, initial encounter: Secondary | ICD-10-CM | POA: Diagnosis not present

## 2014-08-24 DIAGNOSIS — I482 Chronic atrial fibrillation: Secondary | ICD-10-CM | POA: Insufficient documentation

## 2014-08-24 DIAGNOSIS — S99922A Unspecified injury of left foot, initial encounter: Secondary | ICD-10-CM | POA: Diagnosis present

## 2014-08-24 DIAGNOSIS — S92312A Displaced fracture of first metatarsal bone, left foot, initial encounter for closed fracture: Secondary | ICD-10-CM | POA: Diagnosis not present

## 2014-08-24 DIAGNOSIS — S92902A Unspecified fracture of left foot, initial encounter for closed fracture: Secondary | ICD-10-CM

## 2014-08-24 DIAGNOSIS — F329 Major depressive disorder, single episode, unspecified: Secondary | ICD-10-CM | POA: Insufficient documentation

## 2014-08-24 DIAGNOSIS — T148 Other injury of unspecified body region: Secondary | ICD-10-CM | POA: Diagnosis not present

## 2014-08-24 DIAGNOSIS — S92315A Nondisplaced fracture of first metatarsal bone, left foot, initial encounter for closed fracture: Secondary | ICD-10-CM | POA: Diagnosis not present

## 2014-08-24 DIAGNOSIS — M79672 Pain in left foot: Secondary | ICD-10-CM | POA: Diagnosis not present

## 2014-08-24 MED ORDER — ONDANSETRON HCL 4 MG/2ML IJ SOLN
4.0000 mg | Freq: Once | INTRAMUSCULAR | Status: DC
  Filled 2014-08-24: qty 2

## 2014-08-24 MED ORDER — HYDROCODONE-ACETAMINOPHEN 5-325 MG PO TABS
1.0000 | ORAL_TABLET | Freq: Once | ORAL | Status: AC
  Administered 2014-08-24: 1 via ORAL
  Filled 2014-08-24: qty 1

## 2014-08-24 MED ORDER — HYDROCODONE-ACETAMINOPHEN 5-325 MG PO TABS: 2.0000 | ORAL_TABLET | ORAL | Status: DC | PRN

## 2014-08-24 MED ORDER — ONDANSETRON 8 MG PO TBDP
8.0000 mg | ORAL_TABLET | Freq: Once | ORAL | Status: AC
  Administered 2014-08-24: 8 mg via ORAL
  Filled 2014-08-24: qty 1

## 2014-08-24 NOTE — ED Notes (Signed)
MD at bedside. 

## 2014-08-24 NOTE — Discharge Instructions (Signed)
Non weight bearing on splint. Walk with crutches only.  Metatarsal Fracture, Undisplaced A metatarsal fracture is a break in the bone(s) of the foot. These are the bones of the foot that connect your toes to the bones of the ankle. DIAGNOSIS  The diagnoses of these fractures are usually made with X-rays. If there are problems in the forefoot and x-rays are normal a later bone scan will usually make the diagnosis.  TREATMENT AND HOME CARE INSTRUCTIONS  Treatment may or may not include a cast or walking shoe. When casts are needed the use is usually for short periods of time so as not to slow down healing with muscle wasting (atrophy).  Activities should be stopped until further advised by your caregiver.  Wear shoes with adequate shock absorbing capabilities and stiff soles.  Alternative exercise may be undertaken while waiting for healing. These may include bicycling and swimming, or as your caregiver suggests.  It is important to keep all follow-up visits or specialty referrals. The failure to keep these appointments could result in improper bone healing and chronic pain or disability.  Warning: Do not drive a car or operate a motor vehicle until your caregiver specifically tells you it is safe to do so. IF YOU DO NOT HAVE A CAST OR SPLINT:  You may walk on your injured foot as tolerated or advised.  Do not put any weight on your injured foot for as long as directed by your caregiver. Slowly increase the amount of time you walk on the foot as the pain allows or as advised.  Use crutches until you can bear weight without pain. A gradual increase in weight bearing may help.  Apply ice to the injury for 15-20 minutes each hour while awake for the first 2 days. Put the ice in a plastic bag and place a towel between the bag of ice and your skin.  Only take over-the-counter or prescription medicines for pain, discomfort, or fever as directed by your caregiver. SEEK IMMEDIATE MEDICAL CARE  IF:   Your cast gets damaged or breaks.  You have continued severe pain or more swelling than you did before the cast was put on, or the pain is not controlled with medications.  Your skin or nails below the injury turn blue or grey, or feel cold or numb.  There is a bad smell, or new stains or pus-like (purulent) drainage coming from the cast. MAKE SURE YOU:   Understand these instructions.  Will watch your condition.  Will get help right away if you are not doing well or get worse. Document Released: 10/05/2001 Document Revised: 04/07/2011 Document Reviewed: 08/27/2007 [facility] Patient Information 2015 [place], [place]. This information is not intended to replace advice given to you by your health care provider. Make sure you discuss any questions you have with your health care provider.

## 2014-08-24 NOTE — ED Provider Notes (Signed)
CSN: [id number]     Arrival date & time 08/24/14  1059 History  This chart was scribed for Tanna Furry, MD by Randa Evens, ED Scribe. This patient was seen in room APA02/APA02 and the patient's care was started at 11:04 AM.     Chief Complaint  Patient presents with  . Foot Injury  . Knee Injury   The history is provided by the patient. No language interpreter was used.   HPI Comments: Marissa Turner is a 74 y.o. female brought in by ambulance, who presents to the Emergency Department complaining of left foot injury onset PTA. Pt is also complaining of right knee pain. She presents with associated bruising to the left foot. Pt states that she was cleaning churches. She states that she was cleaning a table saw a spider and jumped back and the table fell onto her left foot causing her to fall onto her right knee. Pt states that she was not able to ambulate afterwards. Pt doesn't report numbness or tingling.   Past Medical History  Diagnosis Date  . Atrial fibrillation, chronic 2004    left atrial thrombus in 2004; near syncope in 2008; echocardiogram in 2008-normal EF, mild left atrial enlargement, no valvular abnormalities, lipomatous hypertrophy; no aortic atheroma on TEE in 2004  . Chronic anticoagulation 2004    warfarin  . Tobacco abuse     40 pack years; quit attempt in 2012  . DJD (degenerative joint disease), lumbosacral     Also hip  . Peptic ulcer disease     s/p [place]ro[place] II  . Depression   . Fracture of wrist     Left  . Hypertension     Normal carotid ultrasound in 2008  . PONV (postoperative nausea and vomiting)   . Dysrhythmia     chronic AFib  . Cancer    Past Surgical History  Procedure Laterality Date  . Cholecystectomy  2004    [place] II; splenectomy; incidental appendectomy  . Abdominal hysterectomy      w/o oophorectomy  . Cesarean section    . Tonsillectomy    . Colonoscopy  1980s    patient denies  ever having undergone colonoscopy as of 2014  . Appendectomy    . Splenectomy, total    . Lymph node biopsy Left 09/15/2013    Procedure: LEFT NECK CERVICAL NODE BIOPSY;  Surgeon: Scherry Ran, MD;  Location: [facility];  Service: General;  Laterality: Left;  . Portacath placement Right 10/21/2013    Procedure: ATTEMPTED INSERTION PORT-A-CATH;  Surgeon: Scherry Ran, MD;  Location: [facility];  Service: General;  Laterality: Right;  . Port-a-cath removal Right 07/12/2014    Procedure: REMOVAL PORT-A-CATH;  Surgeon: Aviva Signs Md, MD;  Location: [facility];  Service: General;  Laterality: Right;   Family History  Problem Relation Age of Onset  . Heart attack Mother     also brother  . Breast cancer Sister   . Heart attack Brother   . Breast cancer Sister     x2  . Cirrhosis Sister   . Cancer Brother     Gastric carcinoma; also father   History  Substance Use Topics  . Smoking status: Current Every Day Smoker -- 0.25 packs/day for 55 years    Types: Cigarettes  . Smokeless tobacco: Not on file     Comment: Quit attempt in 2012  . Alcohol Use: No   OB History  No data available      Review of Systems  Constitutional: Negative for fever, chills, diaphoresis, appetite change and fatigue.  HENT: Negative for mouth sores, sore throat and trouble swallowing.   Eyes: Negative for visual disturbance.  Respiratory: Negative for cough, chest tightness, shortness of breath and wheezing.   Cardiovascular: Negative for chest pain.  Gastrointestinal: Negative for nausea, vomiting, abdominal pain, diarrhea and abdominal distention.  Endocrine: Negative for polydipsia, polyphagia and polyuria.  Genitourinary: Negative for dysuria, frequency and hematuria.  Musculoskeletal: Positive for arthralgias.  Skin: Negative for color change, pallor and rash.  Neurological: Negative for dizziness, syncope, light-headedness, numbness and headaches.  Hematological: Does not bruise/bleed easily.   Psychiatric/Behavioral: Negative for behavioral problems and confusion.     Allergies  Diphenhydramine hcl; Estrogens; Penicillins; and Codeine  Home Medications   Prior to Admission medications   Medication Sig Start Date End Date Taking? Authorizing Provider  acetaminophen (TYLENOL) 500 MG tablet Take 1,000 mg by mouth daily as needed for headache.   Yes Historical Provider, MD  allopurinol (ZYLOPRIM) 300 MG tablet TAKE ONE TABLET ONCE DAILY 06/28/14  Yes Baird Cancer, PA-C  ALPRAZolam Duanne Moron) 1 MG tablet TAKE ONE (1) TABLET THREE (3) TIMES EACHDAY ASN NEEDED FOR ANXIETY 04/03/14  Yes Baird Cancer, PA-C  digoxin (LANOXIN) 0.125 MG tablet Take 125 mcg by mouth daily.    Yes Historical Provider, MD  diltiazem (DILACOR XR) 120 MG 24 hr capsule Take 120 mg by mouth daily.   Yes Historical Provider, MD  metoprolol (TOPROL-XL) 25 MG 24 hr tablet Take 12.5 mg by mouth 2 (two) times daily.    Yes Historical Provider, MD  Prenatal Vit-Fe Fumarate-FA (PRENATAL PO) Take 1 tablet by mouth daily.   Yes Historical Provider, MD  warfarin (COUMADIN) 5 MG tablet Take 1 tablet daily except 1/2 tablet on Sundays or as directed Patient taking differently: Take 2.5-5 mg by mouth daily. Take 1 tablet daily except 1/2 tablet on Tuesdays and Saturdays 03/22/14  Yes Herminio Commons, MD  citalopram (CELEXA) 20 MG tablet TAKE ONE (1) TABLET BY MOUTH EVERY DAY Patient not taking: Reported on 08/24/2014 05/30/14   Baird Cancer, PA-C  gabapentin (NEURONTIN) 100 MG capsule Take 1 capsule (100 mg total) by mouth 2 (two) times daily. Patient not taking: Reported on 08/24/2014 10/04/13   Carole Civil, MD  HYDROcodone-acetaminophen (NORCO/VICODIN) 5-325 MG per tablet Take 2 tablets by mouth every 4 (four) hours as needed. 08/24/14   Tanna Furry, MD  potassium chloride SA (K-DUR,KLOR-CON) 20 MEQ tablet Take 1 tablet (20 mEq total) by mouth 2 (two) times daily. Patient not taking: Reported on 08/24/2014 06/12/14    Patrici Ranks, MD   BP 102/76 mmHg  Pulse 71  Temp(Src) 98.5 F (36.9 C) (Oral)  Resp 15  Ht 5' (1.524 m)  Wt 100 lb (45.36 kg)  BMI 19.53 kg/m2  SpO2 97%   Physical Exam  Constitutional: She is oriented to person, place, and time. She appears well-developed and well-nourished. No distress.  HENT:  Head: Normocephalic.  Eyes: Conjunctivae are normal. Pupils are equal, round, and reactive to light. No scleral icterus.  Neck: Normal range of motion. Neck supple. No thyromegaly present.  Cardiovascular: Normal rate and regular rhythm.  Exam reveals no gallop and no friction rub.   No murmur heard. Pulmonary/Chest: Effort normal and breath sounds normal. No respiratory distress. She has no wheezes. She has no rales.  Abdominal: Soft. Bowel sounds are  normal. She exhibits no distension. There is no tenderness. There is no rebound.  Musculoskeletal: Normal range of motion. She exhibits tenderness.  Mild tenderness right knee cap, Full ROM.  Left foot ecchymosis tenderness dorsum overlying 3-5.   Neurological: She is alert and oriented to person, place, and time.  Skin: Skin is warm and dry. No rash noted.  Psychiatric: She has a normal mood and affect. Her behavior is normal.  Nursing note and vitals reviewed.   ED Course  Procedures (including critical care time) DIAGNOSTIC STUDIES: Oxygen Saturation is 100% on RA, normal by my interpretation.    COORDINATION OF CARE: 11:21 AM-Discussed treatment plan with pt at bedside and pt agreed to plan.     Labs Review Labs Reviewed - No data to display  Imaging Review Dg Knee Complete 4 Views Right  08/24/2014   CLINICAL DATA:  Pain and bruising following fall  EXAM: RIGHT KNEE - COMPLETE 4+ VIEW  COMPARISON:  January 25, 2009  FINDINGS: Frontal, lateral, and bilateral oblique views were obtained. There is no fracture or dislocation. No joint effusion. There is moderate narrowing of the patellofemoral joint. There is slightly  milder narrowing in the medial compartment. There is spurring in all compartments. No erosive change.  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 No fracture or joint effusion.   Electronically Signed   By: Lowella Grip III M.D.   On: 08/24/2014 12:15   Dg Foot Complete Left  08/24/2014   CLINICAL DATA:  Table fell on left foot.  EXAM: LEFT FOOT - COMPLETE 3+ VIEW  COMPARISON:  None.  FINDINGS: Frontal, oblique, and lateral views obtained. There is a comminuted fracture of the mid portion of the first metatarsal with lateral and volar displacement and medial angulation of the distal major fracture fragment with respect to the proximal major fragment. There is also an obliquely oriented fracture of the distal aspect of the second metatarsal with alignment near anatomic. No other fractures. No dislocation. There is mild narrowing of all PIP and DIP joints. No erosive change.  IMPRESSION: Comminuted fracture mid portion first metatarsal. Nondisplaced obliquely oriented fracture distal second metatarsal. Narrowing multiple distal joints. No dislocation.   Electronically Signed   By: Lowella Grip III M.D.   On: 08/24/2014 12:13     EKG Interpretation None      MDM   Final diagnoses:  Foot fracture, left, closed, initial encounter    Splint placed. On reevaluation it is properly positioned. Her skin is protected. Instructed her to be nonweightbearing on this. She has walks with crutches at home. She still has it at home. She states "I dont like it but I can". Elevate plan ice intimately. Orthopedic follow-up.  I personally performed the services described in this documentation, which was scribed in my presence. The recorded information has been reviewed and is accurate.      Tanna Furry, MD 08/24/14 1314

## 2014-08-24 NOTE — ED Notes (Signed)
Ice pack applied to left foot.

## 2014-08-24 NOTE — ED Notes (Signed)
Returned from radiology at this time 

## 2014-08-24 NOTE — ED Notes (Signed)
Patient brought in by [facility] EMS for left foot pain/right knee pain. Patient states she was cleaning at church and large table fell onto left foot and she fell onto right knee. Bruising noted to left foot/right knee.

## 2014-08-25 ENCOUNTER — Other Ambulatory Visit (HOSPITAL_COMMUNITY): Payer: Self-pay | Admitting: Oncology

## 2014-08-25 DIAGNOSIS — S92312A Displaced fracture of first metatarsal bone, left foot, initial encounter for closed fracture: Secondary | ICD-10-CM | POA: Diagnosis not present

## 2014-08-25 DIAGNOSIS — F17200 Nicotine dependence, unspecified, uncomplicated: Secondary | ICD-10-CM | POA: Diagnosis not present

## 2014-08-25 DIAGNOSIS — S92325A Nondisplaced fracture of second metatarsal bone, left foot, initial encounter for closed fracture: Secondary | ICD-10-CM | POA: Diagnosis not present

## 2014-08-28 ENCOUNTER — Ambulatory Visit (INDEPENDENT_AMBULATORY_CARE_PROVIDER_SITE_OTHER): Payer: Medicare Other | Admitting: *Deleted

## 2014-08-28 DIAGNOSIS — Z5181 Encounter for therapeutic drug level monitoring: Secondary | ICD-10-CM | POA: Diagnosis not present

## 2014-08-28 DIAGNOSIS — I482 Chronic atrial fibrillation, unspecified: Secondary | ICD-10-CM

## 2014-08-28 DIAGNOSIS — Z7901 Long term (current) use of anticoagulants: Secondary | ICD-10-CM

## 2014-08-28 DIAGNOSIS — I4891 Unspecified atrial fibrillation: Secondary | ICD-10-CM | POA: Diagnosis not present

## 2014-08-28 LAB — POCT INR: INR: 3.7

## 2014-08-31 DIAGNOSIS — S92312D Displaced fracture of first metatarsal bone, left foot, subsequent encounter for fracture with routine healing: Secondary | ICD-10-CM | POA: Diagnosis not present

## 2014-08-31 DIAGNOSIS — S92325D Nondisplaced fracture of second metatarsal bone, left foot, subsequent encounter for fracture with routine healing: Secondary | ICD-10-CM | POA: Diagnosis not present

## 2014-08-31 DIAGNOSIS — F17200 Nicotine dependence, unspecified, uncomplicated: Secondary | ICD-10-CM | POA: Diagnosis not present

## 2014-09-05 ENCOUNTER — Encounter (HOSPITAL_BASED_OUTPATIENT_CLINIC_OR_DEPARTMENT_OTHER): Payer: Medicare Other

## 2014-09-05 ENCOUNTER — Encounter (HOSPITAL_COMMUNITY): Payer: Medicare Other | Attending: Hematology and Oncology | Admitting: Oncology

## 2014-09-05 ENCOUNTER — Encounter (HOSPITAL_COMMUNITY): Payer: Self-pay | Admitting: Oncology

## 2014-09-05 ENCOUNTER — Other Ambulatory Visit (HOSPITAL_COMMUNITY): Payer: Self-pay | Admitting: Hematology & Oncology

## 2014-09-05 VITALS — BP 104/79 | HR 76 | Temp 97.5°F | Resp 18

## 2014-09-05 DIAGNOSIS — Z980 Intestinal bypass and anastomosis status: Secondary | ICD-10-CM | POA: Insufficient documentation

## 2014-09-05 DIAGNOSIS — Z7901 Long term (current) use of anticoagulants: Secondary | ICD-10-CM | POA: Diagnosis not present

## 2014-09-05 DIAGNOSIS — Z9089 Acquired absence of other organs: Secondary | ICD-10-CM | POA: Insufficient documentation

## 2014-09-05 DIAGNOSIS — F17210 Nicotine dependence, cigarettes, uncomplicated: Secondary | ICD-10-CM | POA: Insufficient documentation

## 2014-09-05 DIAGNOSIS — J449 Chronic obstructive pulmonary disease, unspecified: Secondary | ICD-10-CM | POA: Insufficient documentation

## 2014-09-05 DIAGNOSIS — I10 Essential (primary) hypertension: Secondary | ICD-10-CM | POA: Insufficient documentation

## 2014-09-05 DIAGNOSIS — C8331 Diffuse large B-cell lymphoma, lymph nodes of head, face, and neck: Secondary | ICD-10-CM

## 2014-09-05 DIAGNOSIS — Z9079 Acquired absence of other genital organ(s): Secondary | ICD-10-CM | POA: Diagnosis not present

## 2014-09-05 DIAGNOSIS — E611 Iron deficiency: Secondary | ICD-10-CM

## 2014-09-05 DIAGNOSIS — F329 Major depressive disorder, single episode, unspecified: Secondary | ICD-10-CM | POA: Insufficient documentation

## 2014-09-05 DIAGNOSIS — Z90710 Acquired absence of both cervix and uterus: Secondary | ICD-10-CM | POA: Diagnosis not present

## 2014-09-05 DIAGNOSIS — C8519 Unspecified B-cell lymphoma, extranodal and solid organ sites: Secondary | ICD-10-CM | POA: Diagnosis not present

## 2014-09-05 DIAGNOSIS — C8580 Other specified types of non-Hodgkin lymphoma, unspecified site: Secondary | ICD-10-CM

## 2014-09-05 DIAGNOSIS — I482 Chronic atrial fibrillation: Secondary | ICD-10-CM | POA: Diagnosis not present

## 2014-09-05 DIAGNOSIS — M47817 Spondylosis without myelopathy or radiculopathy, lumbosacral region: Secondary | ICD-10-CM | POA: Insufficient documentation

## 2014-09-05 DIAGNOSIS — Z1231 Encounter for screening mammogram for malignant neoplasm of breast: Secondary | ICD-10-CM

## 2014-09-05 LAB — CBC WITH DIFFERENTIAL/PLATELET
BASOS PCT: 1 % (ref 0–1)
Basophils Absolute: 0.1 10*3/uL (ref 0.0–0.1)
Eosinophils Absolute: 0.2 10*3/uL (ref 0.0–0.7)
Eosinophils Relative: 3 % (ref 0–5)
HCT: 39.3 % (ref 36.0–46.0)
HEMOGLOBIN: 13.2 g/dL (ref 12.0–15.0)
Lymphocytes Relative: 38 % (ref 12–46)
Lymphs Abs: 3 10*3/uL (ref 0.7–4.0)
MCH: 31.8 pg (ref 26.0–34.0)
MCHC: 33.6 g/dL (ref 30.0–36.0)
MCV: 94.7 fL (ref 78.0–100.0)
Monocytes Absolute: 0.7 10*3/uL (ref 0.1–1.0)
Monocytes Relative: 9 % (ref 3–12)
NEUTROS PCT: 49 % (ref 43–77)
Neutro Abs: 4 10*3/uL (ref 1.7–7.7)
Platelets: 341 10*3/uL (ref 150–400)
RBC: 4.15 MIL/uL (ref 3.87–5.11)
RDW: 16.4 % — ABNORMAL HIGH (ref 11.5–15.5)
WBC: 8 10*3/uL (ref 4.0–10.5)

## 2014-09-05 LAB — COMPREHENSIVE METABOLIC PANEL
ALT: 20 U/L (ref 14–54)
AST: 24 U/L (ref 15–41)
Albumin: 4.3 g/dL (ref 3.5–5.0)
Alkaline Phosphatase: 75 U/L (ref 38–126)
Anion gap: 10 (ref 5–15)
BUN: 10 mg/dL (ref 6–20)
CO2: 28 mmol/L (ref 22–32)
Calcium: 9.1 mg/dL (ref 8.9–10.3)
Chloride: 102 mmol/L (ref 101–111)
Creatinine, Ser: 0.58 mg/dL (ref 0.44–1.00)
GFR calc Af Amer: >60 mL/min (ref >60)
Glucose, Bld: 108 mg/dL — ABNORMAL HIGH (ref 65–99)
POTASSIUM: 4.7 mmol/L (ref 3.5–5.1)
SODIUM: 140 mmol/L (ref 135–145)
Total Bilirubin: 0.6 mg/dL (ref 0.3–1.2)
Total Protein: 7.7 g/dL (ref 6.5–8.1)

## 2014-09-05 LAB — LACTATE DEHYDROGENASE: LDH: 157 U/L (ref 98–192)

## 2014-09-05 NOTE — Assessment & Plan Note (Addendum)
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 S/P 2 cycles of BR (10/5- 11/29/2013) with progression of disease documented on 12/19/2013 PET scan requiring a change in therapy to R-CHOP x 3 cycles (01/02/2014- 02/13/2014) with complete response on PET on 02/06/2014 followed by radiation therapy, finishing all therapy on 04/12/2014.  Oncology history updated.  Labs today as ordered: CBC diff, CMET, LDH.  Labs in 3 months: CBC diff, CMET, LDH  Mammogram ordered, needs scheduled.  Return in 3 months for follow-up.

## 2014-09-05 NOTE — Progress Notes (Signed)
Delphina Cahill, MD  [address]  [place] [address]  Lymphoma malignant, large cell - Plan: CBC with Differential, Comprehensive metabolic panel, Lactate dehydrogenase  CURRENT THERAPY: Surveillance per NCCN guidelines.  INTERVAL HISTORY: CHESLEY VALLS 74 y.o. 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     Lymphoma malignant, large cell   09/15/2013 Initial Diagnosis Diagnosis Lymph node, biopsy, cervical left neck - DIFFUSE LARGE B CELL LYMPHOMA.   10/06/2013 PET scan Hypermetabolic thickening within the right vallecular consists with primary head and neck cancer.  Single enlarged hypermetabolic right level IIa lymph node consistent local nodal metastasis.   10/31/2013 - 11/29/2013 Chemotherapy Bendamustine/Rituxan x 2 cycles   12/19/2013 Progression PET- Progression of presumed vallecular lymphoma as evidenced by increased hypermetabolism.   12/20/2013 Treatment Plan Change Per Dr. Gari Crown, patient has a CD10 negative, non-germinal center non-Hodgkin's lymphoma.  Patient may be a candidate for additional of Revlimid to treatment plan.   12/28/2013 Echocardiogram MUGA- Normal left ventricular wall motion with calculated ejection fraction of 60%.   01/02/2014 - 02/13/2014 Chemotherapy R-CHOP with Neulasta support.   02/06/2014 Remission PET- Resolution of the previously enlarged right-sided station 2 lymph  node in the neck with resolution of the abnormal right vallecular  activity in the neck. Currently no specific in findings of residual  lymphoma.    - 04/12/2014 Radiation Therapy Dr. Lisbeth Renshaw   07/12/2014 Procedure Port removed- Dr. Arnoldo Morale    I personally reviewed and went over laboratory results with the patient.  The results are noted within this dictation.  They are updated today.  She reports that she feels great.  She notes that she remains very active.  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  She denies any B symptoms.  Her appetite is improved, but she could not afford Megace therapy.  As a result, I will remove this from her medication list.  She recently fractured her left foot, of her first metatarsal.  She is currently in a boot.  She reports that it is improving.  She says, "I broke it chasing a spider."     Past Medical History  Diagnosis Date  . Atrial fibrillation, chronic 2004    left atrial thrombus in 2004; near syncope in 2008; echocardiogram in 2008-normal EF, mild left atrial enlargement, no valvular abnormalities, lipomatous hypertrophy; no aortic atheroma on TEE in 2004  . Chronic anticoagulation 2004    warfarin  . Tobacco abuse     40 pack years; quit attempt in 2012  . DJD (degenerative joint disease), lumbosacral     Also hip  . Peptic ulcer disease     s/p Billroth II  . Depression   . Fracture of wrist     Left  . Hypertension     Normal carotid ultrasound in 2008  . PONV (postoperative nausea and vomiting)   . Dysrhythmia     chronic AFib  . Cancer     has SPINAL STENOSIS; DJD (degenerative joint disease), lumbosacral; Atrial fibrillation, chronic; Chronic anticoagulation; Tobacco abuse; Peptic ulcer disease; Hypertension; Encounter for therapeutic drug monitoring; Lymphoma malignant, large cell; and Iron deficiency on her problem list.     is allergic to diphenhydramine hcl; estrogens; penicillins; and codeine.  Current Outpatient Prescriptions on File Prior to Visit  Medication Sig Dispense Refill  . acetaminophen (TYLENOL) 500 MG tablet Take 1,000 mg  by mouth daily as needed for headache.    . allopurinol (ZYLOPRIM) 300 MG tablet TAKE ONE TABLET ONCE DAILY 30 tablet 2  . ALPRAZolam (XANAX) 1 MG tablet TAKE ONE (1) TABLET THREE (3) TIMES EACHDAY ASN NEEDED FOR ANXIETY 90 tablet 2  . citalopram (CELEXA) 20 MG tablet TAKE ONE (1) TABLET BY MOUTH EVERY DAY 30 tablet 2  . digoxin (LANOXIN) 0.125 MG tablet Take 125 mcg by mouth  daily.     [name] diltiazem (DILACOR XR) 120 MG 24 hr capsule Take 120 mg by mouth daily.    [name] gabapentin (NEURONTIN) 100 MG capsule Take 1 capsule (100 mg total) by mouth 2 (two) times daily. (Patient not taking: Reported on 08/24/2014) 60 capsule 5  . HYDROcodone-acetaminophen (NORCO/VICODIN) 5-325 MG per tablet Take 2 tablets by mouth every 4 (four) hours as needed. 20 tablet 0  . metoprolol (TOPROL-XL) 25 MG 24 hr tablet Take 12.5 mg by mouth 2 (two) times daily.     . potassium chloride SA (K-DUR,KLOR-CON) 20 MEQ tablet TAKE ONE TABLET BY MOUTH TWICE A DAY 60 tablet 1  . Prenatal Vit-Fe Fumarate-FA (PRENATAL PO) Take 1 tablet by mouth daily.    [name] warfarin (COUMADIN) 5 MG tablet TAKE 1 TABLET DAILY EXCEPT TAKE 1/2 TABLET ON SUNDAYS OR AS DIRECTED 30 tablet 3   No current facility-administered medications on file prior to visit.    Past Surgical History  Procedure Laterality Date  . Cholecystectomy  2004    [place] II; splenectomy; incidental appendectomy  . Abdominal hysterectomy      w/o oophorectomy  . Cesarean section    . Tonsillectomy    . Colonoscopy  1980s    patient denies ever having undergone colonoscopy as of 2014  . Appendectomy    . Splenectomy, total    . Lymph node biopsy Left 09/15/2013    Procedure: LEFT NECK CERVICAL NODE BIOPSY;  Surgeon: Scherry Ran, MD;  Location: [facility];  Service: General;  Laterality: Left;  . Portacath placement Right 10/21/2013    Procedure: ATTEMPTED INSERTION PORT-A-CATH;  Surgeon: Scherry Ran, MD;  Location: [facility];  Service: General;  Laterality: Right;  . Port-a-cath removal Right 07/12/2014    Procedure: REMOVAL PORT-A-CATH;  Surgeon: Aviva Signs Md, MD;  Location: [facility];  Service: General;  Laterality: Right;    Denies any headaches, dizziness, double vision, fevers, chills, night sweats, nausea, vomiting, diarrhea, constipation, chest pain, heart palpitations, shortness of  breath, blood in stool, black tarry stool, urinary pain, urinary burning, urinary frequency, hematuria.   PHYSICAL EXAMINATION  ECOG PERFORMANCE STATUS: 1 - Symptomatic but completely ambulatory  Filed Vitals:   09/05/14 1200  BP: 104/79  Pulse: 76  Temp: 97.5 F (36.4 C)  Resp: 18    GENERAL:alert, no distress, well nourished, comfortable, cooperative, smiling and accompanied by daughter. SKIN: skin color, texture, turgor are normal, no rashes or significant lesions HEAD: Normocephalic, No masses, lesions, tenderness or abnormalities EYES: normal, PERRLA, EOMI, Conjunctiva are pink and non-injected EARS: External ears normal OROPHARYNX:lips, buccal mucosa, and tongue normal and mucous membranes are moist  NECK: supple, no adenopathy, thyroid normal size, non-tender, without nodularity, no stridor, non-tender, trachea midline LYMPH:  no palpable lymphadenopathy, abdominal exam hindered due to patient difficulty getting on the exam table, will be deferred until next appointment date. BREAST:not examined LUNGS: clear to auscultation and percussion HEART: regular rate & rhythm, no murmurs, no  gallops, S1 normal and S2 normal ABDOMEN:abdomen soft, non-tender and normal bowel sounds BACK: Back symmetric, no curvature., No CVA tenderness EXTREMITIES:less then 2 second capillary refill, no joint deformities, effusion, or inflammation, no edema, no skin discoloration, no clubbing, no cyanosis, walking boot on left foot  NEURO: alert & oriented x 3 with fluent speech, no focal motor/sensory deficits, gait normal     LABORATORY DATA: CBC    Component Value Date/Time   WBC 8.0 09/05/2014 1244   RBC 4.15 09/05/2014 1244   RBC 4.84 10/10/2013 1502   HGB 13.2 09/05/2014 1244   HCT 39.3 09/05/2014 1244   PLT 341 09/05/2014 1244   MCV 94.7 09/05/2014 1244   MCH 31.8 09/05/2014 1244   MCHC 33.6 09/05/2014 1244   RDW 16.4* 09/05/2014 1244   LYMPHSABS 3.0 09/05/2014 1244   MONOABS 0.7  09/05/2014 1244   EOSABS 0.2 09/05/2014 1244   BASOSABS 0.1 09/05/2014 1244      Chemistry      Component Value Date/Time   NA 139 06/06/2014 1110   K 3.1* 06/06/2014 1110   CL 98* 06/06/2014 1110   CO2 31 06/06/2014 1110   BUN 6 06/06/2014 1110   CREATININE 0.56 06/06/2014 1110   CREATININE 0.65 06/30/2013 0840      Component Value Date/Time   CALCIUM 8.6* 06/06/2014 1110   ALKPHOS 70 06/06/2014 1110   AST 24 06/06/2014 1110   ALT 18 06/06/2014 1110   BILITOT 0.4 06/06/2014 1110        PENDING LABS:   RADIOGRAPHIC STUDIES:  Dg Knee Complete 4 Views Right  08/24/2014   CLINICAL DATA:  Pain and bruising following fall  EXAM: RIGHT KNEE - COMPLETE 4+ VIEW  COMPARISON:  January 25, 2009  FINDINGS: Frontal, lateral, and bilateral oblique views were obtained. There is no fracture or dislocation. No joint effusion. There is moderate narrowing of the patellofemoral joint. There is slightly milder narrowing in the medial compartment. There is spurring in all compartments. No erosive change.  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 No fracture or joint effusion.   Electronically Signed   By: Lowella Grip III M.D.   On: 08/24/2014 12:15   Dg Foot Complete Left  08/24/2014   CLINICAL DATA:  Table fell on left foot.  EXAM: LEFT FOOT - COMPLETE 3+ VIEW  COMPARISON:  None.  FINDINGS: Frontal, oblique, and lateral views obtained. There is a comminuted fracture of the mid portion of the first metatarsal with lateral and volar displacement and medial angulation of the distal major fracture fragment with respect to the proximal major fragment. There is also an obliquely oriented fracture of the distal aspect of the second metatarsal with alignment near anatomic. No other fractures. No dislocation. There is mild narrowing of all PIP and DIP joints. No erosive change.  IMPRESSION: Comminuted fracture mid portion first  metatarsal. Nondisplaced obliquely oriented fracture distal second metatarsal. Narrowing multiple distal joints. No dislocation.   Electronically Signed   By: Lowella Grip III M.D.   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 S/P 2 cycles of BR (10/5- 11/29/2013) with progression of disease documented on 12/19/2013 PET scan requiring a change in therapy to R-CHOP x 3 cycles (01/02/2014- 02/13/2014) with complete response on PET on 02/06/2014 followed by  radiation therapy, finishing all therapy on 04/12/2014.  Oncology history updated.  Labs today as ordered: CBC diff, CMET, LDH.  Labs in 3 months: CBC diff, CMET, LDH  Mammogram ordered, needs scheduled.  Return in 3 months for follow-up.    THERAPY PLAN:  NCCN guidelines recommends the follow surveillance for Stage I-IV NHL for those who attain a complete response to therapy:  A. H&P every 3-6 months for 5 years, then yearly or as clinically indicated.  B. Labs every 3-6 months for 5 years and then annually or as clinically indicated.  C. Repeat CT scans only as clinically indicated.    All questions were answered. The patient knows to call the clinic with any problems, questions or concerns. We can certainly see the patient much sooner if necessary.  Patient and plan discussed with Dr. Ancil Linsey and she is in agreement with the aforementioned.   This note is electronically signed by: Robynn Pane, PA-C 09/05/2014 1:16 PM

## 2014-09-05 NOTE — Patient Instructions (Signed)
[place] at [facility] Discharge Instructions  RECOMMENDATIONS MADE BY THE CONSULTANT AND ANY TEST RESULTS WILL BE SENT TO YOUR REFERRING PHYSICIAN.  Exam completed by Dr Whitney Muse Labs today Mammogram next month Labs in 3 months and return to see the doctor in 3 months Please call the clinic if you have any questions or concerns  Thank you for choosing [place] at [facility] to provide your oncology and hematology care.  To afford each patient quality time with our provider, please arrive at least 15 minutes before your scheduled appointment time.    You need to re-schedule your appointment should you arrive 10 or more minutes late.  We strive to give you quality time with our providers, and arriving late affects you and other patients whose appointments are after yours.  Also, if you no show three or more times for appointments you may be dismissed from the clinic at the providers discretion.     Again, thank you for choosing [facility].  Our hope is that these requests will decrease the amount of time that you wait before being seen by our physicians.       _____________________________________________________________  Should you have questions after your visit to [facility], please contact our office at [phone] between the hours of 8:30 a.m. and 4:30 p.m.  Voicemails left after 4:30 p.m. will not be returned until the following business day.  For prescription refill requests, have your pharmacy contact our office.

## 2014-09-06 NOTE — Progress Notes (Signed)
Labs drawn

## 2014-09-13 ENCOUNTER — Ambulatory Visit (INDEPENDENT_AMBULATORY_CARE_PROVIDER_SITE_OTHER): Payer: Medicare Other | Admitting: *Deleted

## 2014-09-13 DIAGNOSIS — Z681 Body mass index (BMI) 19 or less, adult: Secondary | ICD-10-CM | POA: Diagnosis not present

## 2014-09-13 DIAGNOSIS — I4891 Unspecified atrial fibrillation: Secondary | ICD-10-CM

## 2014-09-13 DIAGNOSIS — Z5181 Encounter for therapeutic drug level monitoring: Secondary | ICD-10-CM

## 2014-09-13 DIAGNOSIS — I482 Chronic atrial fibrillation, unspecified: Secondary | ICD-10-CM

## 2014-09-13 DIAGNOSIS — Z7901 Long term (current) use of anticoagulants: Secondary | ICD-10-CM | POA: Diagnosis not present

## 2014-09-13 DIAGNOSIS — S92325D Nondisplaced fracture of second metatarsal bone, left foot, subsequent encounter for fracture with routine healing: Secondary | ICD-10-CM | POA: Diagnosis not present

## 2014-09-13 DIAGNOSIS — F17200 Nicotine dependence, unspecified, uncomplicated: Secondary | ICD-10-CM | POA: Diagnosis not present

## 2014-09-13 DIAGNOSIS — S92312D Displaced fracture of first metatarsal bone, left foot, subsequent encounter for fracture with routine healing: Secondary | ICD-10-CM | POA: Diagnosis not present

## 2014-09-13 LAB — POCT INR: INR: 1.9

## 2014-10-04 ENCOUNTER — Ambulatory Visit (HOSPITAL_COMMUNITY)
Admission: RE | Admit: 2014-10-04 | Discharge: 2014-10-04 | Disposition: A | Discharge status: Home / Self Care | Payer: Medicare Other | Source: Ambulatory Visit | Attending: Hematology & Oncology | Admitting: Hematology & Oncology

## 2014-10-04 ENCOUNTER — Ambulatory Visit (INDEPENDENT_AMBULATORY_CARE_PROVIDER_SITE_OTHER): Payer: Medicare Other | Admitting: *Deleted

## 2014-10-04 DIAGNOSIS — Z1231 Encounter for screening mammogram for malignant neoplasm of breast: Secondary | ICD-10-CM | POA: Insufficient documentation

## 2014-10-04 DIAGNOSIS — Z5181 Encounter for therapeutic drug level monitoring: Secondary | ICD-10-CM | POA: Diagnosis not present

## 2014-10-04 DIAGNOSIS — I482 Chronic atrial fibrillation, unspecified: Secondary | ICD-10-CM

## 2014-10-04 DIAGNOSIS — Z7901 Long term (current) use of anticoagulants: Secondary | ICD-10-CM

## 2014-10-04 DIAGNOSIS — I4891 Unspecified atrial fibrillation: Secondary | ICD-10-CM | POA: Diagnosis not present

## 2014-10-04 LAB — POCT INR: INR: 2.4

## 2014-10-28 ENCOUNTER — Other Ambulatory Visit (HOSPITAL_COMMUNITY): Payer: Self-pay | Admitting: Hematology & Oncology

## 2014-11-01 ENCOUNTER — Ambulatory Visit (INDEPENDENT_AMBULATORY_CARE_PROVIDER_SITE_OTHER): Payer: Medicare Other | Admitting: *Deleted

## 2014-11-01 DIAGNOSIS — Z5181 Encounter for therapeutic drug level monitoring: Secondary | ICD-10-CM | POA: Diagnosis not present

## 2014-11-01 DIAGNOSIS — I482 Chronic atrial fibrillation, unspecified: Secondary | ICD-10-CM

## 2014-11-01 LAB — POCT INR: INR: 2.1

## 2014-11-06 ENCOUNTER — Ambulatory Visit (HOSPITAL_COMMUNITY)
Admission: RE | Admit: 2014-11-06 | Discharge: 2014-11-06 | Disposition: A | Discharge status: Home / Self Care | Payer: Medicare Other | Source: Ambulatory Visit | Attending: Internal Medicine | Admitting: Internal Medicine

## 2014-11-06 ENCOUNTER — Other Ambulatory Visit (HOSPITAL_COMMUNITY): Payer: Self-pay | Admitting: Internal Medicine

## 2014-11-06 DIAGNOSIS — Z23 Encounter for immunization: Secondary | ICD-10-CM | POA: Diagnosis not present

## 2014-11-06 DIAGNOSIS — R52 Pain, unspecified: Secondary | ICD-10-CM

## 2014-11-06 DIAGNOSIS — M25561 Pain in right knee: Secondary | ICD-10-CM | POA: Diagnosis present

## 2014-11-06 DIAGNOSIS — M542 Cervicalgia: Secondary | ICD-10-CM | POA: Diagnosis not present

## 2014-11-15 ENCOUNTER — Telehealth: Payer: Self-pay | Admitting: Orthopedic Surgery
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
## 2014-11-16 DIAGNOSIS — M25561 Pain in right knee: Secondary | ICD-10-CM | POA: Diagnosis not present

## 2014-11-20 ENCOUNTER — Other Ambulatory Visit (HOSPITAL_COMMUNITY): Payer: Self-pay | Admitting: Oncology

## 2014-11-29 ENCOUNTER — Ambulatory Visit (INDEPENDENT_AMBULATORY_CARE_PROVIDER_SITE_OTHER): Payer: Medicare Other | Admitting: *Deleted

## 2014-11-29 DIAGNOSIS — Z5181 Encounter for therapeutic drug level monitoring: Secondary | ICD-10-CM

## 2014-11-29 DIAGNOSIS — I482 Chronic atrial fibrillation, unspecified: Secondary | ICD-10-CM

## 2014-11-29 LAB — POCT INR: INR: 1.5

## 2014-11-30 ENCOUNTER — Ambulatory Visit (INDEPENDENT_AMBULATORY_CARE_PROVIDER_SITE_OTHER): Payer: Medicare Other | Admitting: Cardiovascular Disease

## 2014-11-30 ENCOUNTER — Encounter: Payer: Self-pay | Admitting: Cardiovascular Disease

## 2014-11-30 VITALS — BP 92/60 | HR 88 | Ht 60.0 in | Wt 97.0 lb

## 2014-11-30 DIAGNOSIS — I482 Chronic atrial fibrillation, unspecified: Secondary | ICD-10-CM

## 2014-11-30 DIAGNOSIS — I10 Essential (primary) hypertension: Secondary | ICD-10-CM | POA: Diagnosis not present

## 2014-11-30 DIAGNOSIS — Z7901 Long term (current) use of anticoagulants: Secondary | ICD-10-CM | POA: Diagnosis not present

## 2014-11-30 NOTE — Patient Instructions (Signed)
Your physician wants you to follow-up in:  1 year with Dr Koneswaran You will receive a reminder letter in the mail two months in advance. If you don't receive a letter, please call our office to schedule the follow-up appointment.    Your physician recommends that you continue on your current medications as directed. Please refer to the Current Medication list given to you today.    If you need a refill on your cardiac medications before your next appointment, please call your pharmacy.     Thank you for choosing [facility] !        

## 2014-11-30 NOTE — Progress Notes (Signed)
Patient ID: Marissa Turner, female   DOB: 1940/08/21, 74 y.o.   MRN: [id number]      SUBJECTIVE: The patient presents for routine follow-up. She has a history of chronic atrial fibrillation and hypertension. She is doing well, and stays very active by doing gardening, canning and giving away vegetables, doing housework, cleaning churches, as well as taking care of her great grandchildren.   She denies chest pain, shortness of breath, palpitations, lightheadedness and leg swelling.   ECG performed in the office today demonstrated atrial fibrillation, heart rate 75 bpm.  Her sister is Ignacia Marvel, who is also my patient.   Review of Systems: As per "subjective", otherwise negative.  Allergies  Allergen Reactions  . Diphenhydramine Hcl Other (See Comments)    hallucinations   . Estrogens Other (See Comments)    edema  . Penicillins Other (See Comments)    Break out in whelps.  . Codeine Swelling and Rash    Current Outpatient Prescriptions  Medication Sig Dispense Refill  . acetaminophen (TYLENOL) 500 MG tablet Take 1,000 mg by mouth daily as needed for headache.    [name] acyclovir (ZOVIRAX) 400 MG tablet     . allopurinol (ZYLOPRIM) 300 MG tablet TAKE ONE TABLET ONCE DAILY 30 tablet 2  . ALPRAZolam (XANAX) 1 MG tablet TAKE ONE (1) TABLET THREE (3) TIMES EACHDAY ASN NEEDED FOR ANXIETY 90 tablet 2  . citalopram (CELEXA) 20 MG tablet TAKE ONE (1) TABLET BY MOUTH EVERY DAY 30 tablet 3  . digoxin (LANOXIN) 0.125 MG tablet Take 125 mcg by mouth daily.     [name] diltiazem (DILACOR XR) 120 MG 24 hr capsule Take 120 mg by mouth daily.    [name] gabapentin (NEURONTIN) 100 MG capsule Take 1 capsule (100 mg total) by mouth 2 (two) times daily. 60 capsule 5  . HYDROcodone-acetaminophen (NORCO/VICODIN) 5-325 MG per tablet Take 2 tablets by mouth every 4 (four) hours as needed. 20 tablet 0  . metoprolol (TOPROL-XL) 25 MG 24 hr tablet Take 12.5 mg by mouth 2 (two) times daily.     . potassium chloride SA  (K-DUR,KLOR-CON) 20 MEQ tablet TAKE ONE TABLET BY MOUTH TWICE A DAY 60 tablet 1  . Prenatal Vit-Fe Fumarate-FA (PRENATAL PO) Take 1 tablet by mouth daily.    . traMADol (ULTRAM) 50 MG tablet     . warfarin (COUMADIN) 5 MG tablet TAKE 1 TABLET DAILY EXCEPT TAKE 1/2 TABLET ON 'SUNDAYS OR AS DIRECTED 30 tablet 3   No current facility-administered medications for this visit.    Past Medical History  Diagnosis Date  . Atrial fibrillation, chronic (HCC) 2004    left atrial thrombus in 2004; near syncope in 2008; echocardiogram in 2008-normal EF, mild left atrial enlargement, no valvular abnormalities, lipomatous hypertrophy; no aortic atheroma on TEE in 2004  . Chronic anticoagulation 2004    warfarin  . Tobacco abuse     40'$  pack years; quit attempt in 2012  . DJD (degenerative joint disease), lumbosacral     Also hip  . Peptic ulcer disease     s/p Billroth II  . Depression   . Fracture of wrist     Left  . Hypertension     Normal carotid ultrasound in 2008  . PONV (postoperative nausea and vomiting)   . Dysrhythmia     chronic AFib  . Cancer [facility])     Past Surgical History  Procedure Laterality Date  . Cholecystectomy  [facility]  .  Gastrojejunostomy  1984    [place] II; splenectomy; incidental appendectomy  . Abdominal hysterectomy      w/o oophorectomy  . Cesarean section    . Tonsillectomy    . Colonoscopy  1980s    patient denies ever having undergone colonoscopy as of 2014  . Appendectomy    . Splenectomy, total    . Lymph node biopsy Left 09/15/2013    Procedure: LEFT NECK CERVICAL NODE BIOPSY;  Surgeon: Scherry Ran, MD;  Location: [facility];  Service: General;  Laterality: Left;  . Portacath placement Right 10/21/2013    Procedure: ATTEMPTED INSERTION PORT-A-CATH;  Surgeon: Scherry Ran, MD;  Location: [facility];  Service: General;  Laterality: Right;  . Port-a-cath removal Right 07/12/2014    Procedure: REMOVAL PORT-A-CATH;  Surgeon: Aviva Signs Md, MD;  Location: [facility];  Service: General;  Laterality: Right;    Social History   Social History  . Marital Status: Married    Spouse Name: N/A  . Number of Children: 2  . Years of Education: N/A   Occupational History  . Housecleaning    Social History Main Topics  . Smoking status: Current Every Day Smoker -- 0.25 packs/day for 55 years    Types: Cigarettes  . Smokeless tobacco: Not on file     Comment: Quit attempt in 2012  . Alcohol Use: No  . Drug Use: No  . Sexual Activity: Yes    Birth Control/ Protection: Surgical   Other Topics Concern  . Not on file   Social History Narrative   Married with one child and one adopted child, 1 stillbirth, and one deceased child at age 62 due to illness   No regular exercise     Filed Vitals:   11/30/14 1307  BP: 92/60  Pulse: 88  Height: 5' (1.524 m)  Weight: 97 lb (43.999 kg)  SpO2: 95%    PHYSICAL EXAM General: NAD Neck: No JVD, no thyromegaly. Lungs: Clear to auscultation bilaterally with normal respiratory effort. CV: Nondisplaced PMI. Irregulary rhythm, normal rate, normal S1/S2, no S3, no murmur. No pretibial or periankle edema. No carotid bruit.  Abdomen: Soft, nontender, no hepatosplenomegaly, no distention.  Neurologic: Alert and oriented x 3.  Psych: Normal affect. Extremities: No clubbing or cyanosis.  Skin: Normal. Musculoskeletal: Normal range of motion, no gross deformities.  ECG: Most recent ECG reviewed.      ASSESSMENT AND PLAN: 1. Chronic atrial fibrillation: Rate controlled, asymptomatic, and tolerating warfarin. No changes.  2. Essential hypertension: Low normal. Asymptomatic. No changes.  3. Tobacco abuse: Cessation counseling previously given.  Dispo: f/u 1 year.   Kate Sable, M.D., F.A.C.C.

## 2014-12-06 ENCOUNTER — Other Ambulatory Visit (HOSPITAL_COMMUNITY): Payer: Self-pay | Admitting: Oncology

## 2014-12-06 ENCOUNTER — Encounter (HOSPITAL_COMMUNITY): Payer: Medicare Other

## 2014-12-06 ENCOUNTER — Encounter (HOSPITAL_COMMUNITY): Payer: Medicare Other | Attending: Hematology and Oncology | Admitting: Oncology

## 2014-12-06 VITALS — BP 130/65 | HR 69 | Temp 98.8°F | Resp 18 | Wt 96.6 lb

## 2014-12-06 DIAGNOSIS — F17210 Nicotine dependence, cigarettes, uncomplicated: Secondary | ICD-10-CM | POA: Insufficient documentation

## 2014-12-06 DIAGNOSIS — Z7901 Long term (current) use of anticoagulants: Secondary | ICD-10-CM | POA: Diagnosis not present

## 2014-12-06 DIAGNOSIS — Z9079 Acquired absence of other genital organ(s): Secondary | ICD-10-CM | POA: Diagnosis not present

## 2014-12-06 DIAGNOSIS — C8580 Other specified types of non-Hodgkin lymphoma, unspecified site: Secondary | ICD-10-CM

## 2014-12-06 DIAGNOSIS — Z90710 Acquired absence of both cervix and uterus: Secondary | ICD-10-CM | POA: Diagnosis not present

## 2014-12-06 DIAGNOSIS — Z980 Intestinal bypass and anastomosis status: Secondary | ICD-10-CM | POA: Insufficient documentation

## 2014-12-06 DIAGNOSIS — F329 Major depressive disorder, single episode, unspecified: Secondary | ICD-10-CM | POA: Diagnosis not present

## 2014-12-06 DIAGNOSIS — C8331 Diffuse large B-cell lymphoma, lymph nodes of head, face, and neck: Secondary | ICD-10-CM

## 2014-12-06 DIAGNOSIS — C8519 Unspecified B-cell lymphoma, extranodal and solid organ sites: Secondary | ICD-10-CM | POA: Diagnosis not present

## 2014-12-06 DIAGNOSIS — I10 Essential (primary) hypertension: Secondary | ICD-10-CM | POA: Insufficient documentation

## 2014-12-06 DIAGNOSIS — M47817 Spondylosis without myelopathy or radiculopathy, lumbosacral region: Secondary | ICD-10-CM | POA: Diagnosis not present

## 2014-12-06 DIAGNOSIS — I482 Chronic atrial fibrillation: Secondary | ICD-10-CM | POA: Diagnosis not present

## 2014-12-06 DIAGNOSIS — Z9089 Acquired absence of other organs: Secondary | ICD-10-CM | POA: Insufficient documentation

## 2014-12-06 DIAGNOSIS — J449 Chronic obstructive pulmonary disease, unspecified: Secondary | ICD-10-CM | POA: Insufficient documentation

## 2014-12-06 LAB — COMPREHENSIVE METABOLIC PANEL
ALBUMIN: 3.6 g/dL (ref 3.5–5.0)
ALT: 16 U/L (ref 14–54)
AST: 21 U/L (ref 15–41)
Alkaline Phosphatase: 68 U/L (ref 38–126)
Anion gap: 9 (ref 5–15)
BUN: 12 mg/dL (ref 6–20)
CHLORIDE: 98 mmol/L — AB (ref 101–111)
CO2: 30 mmol/L (ref 22–32)
Calcium: 8.7 mg/dL — ABNORMAL LOW (ref 8.9–10.3)
Creatinine, Ser: 0.57 mg/dL (ref 0.44–1.00)
GFR calc Af Amer: >60 mL/min (ref >60)
GFR calc non Af Amer: >60 mL/min (ref >60)
GLUCOSE: 100 mg/dL — AB (ref 65–99)
Potassium: 4 mmol/L (ref 3.5–5.1)
Sodium: 137 mmol/L (ref 135–145)
Total Bilirubin: 0.2 mg/dL — ABNORMAL LOW (ref 0.3–1.2)
Total Protein: 6.9 g/dL (ref 6.5–8.1)

## 2014-12-06 LAB — CBC WITH DIFFERENTIAL/PLATELET
BASOS ABS: 0.1 10*3/uL (ref 0.0–0.1)
Basophils Relative: 0 %
Eosinophils Absolute: 0.2 10*3/uL (ref 0.0–0.7)
Eosinophils Relative: 1 %
HCT: 35.5 % — ABNORMAL LOW (ref 36.0–46.0)
Hemoglobin: 11.7 g/dL — ABNORMAL LOW (ref 12.0–15.0)
Lymphocytes Relative: 23 %
Lymphs Abs: 2.9 10*3/uL (ref 0.7–4.0)
MCH: 31.6 pg (ref 26.0–34.0)
MCHC: 33 g/dL (ref 30.0–36.0)
MCV: 95.9 fL (ref 78.0–100.0)
MONO ABS: 0.9 10*3/uL (ref 0.1–1.0)
Monocytes Relative: 7 %
Neutro Abs: 8.8 10*3/uL — ABNORMAL HIGH (ref 1.7–7.7)
Neutrophils Relative %: 69 %
PLATELETS: 361 10*3/uL (ref 150–400)
RBC: 3.7 MIL/uL — AB (ref 3.87–5.11)
RDW: 13.3 % (ref 11.5–15.5)
WBC: 12.9 10*3/uL — AB (ref 4.0–10.5)

## 2014-12-06 LAB — LACTATE DEHYDROGENASE: LDH: 132 U/L (ref 98–192)

## 2014-12-06 NOTE — Patient Instructions (Signed)
..  [place] at [facility] Discharge Instructions  RECOMMENDATIONS MADE BY THE CONSULTANT AND ANY TEST RESULTS WILL BE SENT TO YOUR REFERRING PHYSICIAN.  Labs in 3 months and see the Dr. In 3 months  Thank you for choosing [place] at [facility] to provide your oncology and hematology care.  To afford each patient quality time with our provider, please arrive at least 15 minutes before your scheduled appointment time.    You need to re-schedule your appointment should you arrive 10 or more minutes late.  We strive to give you quality time with our providers, and arriving late affects you and other patients whose appointments are after yours.  Also, if you no show three or more times for appointments you may be dismissed from the clinic at the providers discretion.     Again, thank you for choosing [facility].  Our hope is that these requests will decrease the amount of time that you wait before being seen by our physicians.       _____________________________________________________________  Should you have questions after your visit to [facility], please contact our office at [phone] between the hours of 8:30 a.m. and 4:30 p.m.  Voicemails left after 4:30 p.m. will not be returned until the following business day.  For prescription refill requests, have your pharmacy contact our office.
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
## 2014-12-06 NOTE — Assessment & Plan Note (Addendum)
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 S/P 2 cycles of BR (10/5- 11/29/2013) with progression of disease documented on 12/19/2013 PET scan requiring a change in therapy to R-CHOP x 3 cycles (01/02/2014- 02/13/2014) with complete response on PET on 02/06/2014 followed by radiation therapy, finishing all therapy on 04/12/2014.  Oncology history is up-to-date.  Labs today as ordered: CBC diff, CMET, LDH.  Labs in 3 months: CBC diff, CMET, LDH, B2M  I personally reviewed and went over radiographic studies with the patient.  The results are noted within this dictation.  Next mammogram is due in Sept 2017.  Return in 3 months for follow-up.

## 2014-12-11 ENCOUNTER — Ambulatory Visit (INDEPENDENT_AMBULATORY_CARE_PROVIDER_SITE_OTHER): Payer: Medicare Other | Admitting: *Deleted

## 2014-12-11 DIAGNOSIS — Z5181 Encounter for therapeutic drug level monitoring: Secondary | ICD-10-CM

## 2014-12-11 DIAGNOSIS — I482 Chronic atrial fibrillation, unspecified: Secondary | ICD-10-CM

## 2014-12-11 LAB — POCT INR: INR: 2.6

## 2014-12-18 ENCOUNTER — Encounter (HOSPITAL_BASED_OUTPATIENT_CLINIC_OR_DEPARTMENT_OTHER): Payer: Medicare Other

## 2014-12-18 DIAGNOSIS — F329 Major depressive disorder, single episode, unspecified: Secondary | ICD-10-CM | POA: Diagnosis not present

## 2014-12-18 DIAGNOSIS — C8519 Unspecified B-cell lymphoma, extranodal and solid organ sites: Secondary | ICD-10-CM | POA: Diagnosis not present

## 2014-12-18 DIAGNOSIS — I482 Chronic atrial fibrillation: Secondary | ICD-10-CM | POA: Diagnosis not present

## 2014-12-18 DIAGNOSIS — C8580 Other specified types of non-Hodgkin lymphoma, unspecified site: Secondary | ICD-10-CM

## 2014-12-18 DIAGNOSIS — J449 Chronic obstructive pulmonary disease, unspecified: Secondary | ICD-10-CM | POA: Diagnosis not present

## 2014-12-18 DIAGNOSIS — F17210 Nicotine dependence, cigarettes, uncomplicated: Secondary | ICD-10-CM | POA: Diagnosis not present

## 2014-12-18 DIAGNOSIS — I10 Essential (primary) hypertension: Secondary | ICD-10-CM | POA: Diagnosis not present

## 2014-12-18 LAB — CBC WITH DIFFERENTIAL/PLATELET
Basophils Absolute: 0 10*3/uL (ref 0.0–0.1)
Basophils Relative: 1 %
EOS PCT: 4 %
Eosinophils Absolute: 0.2 10*3/uL (ref 0.0–0.7)
HCT: 39.7 % (ref 36.0–46.0)
Hemoglobin: 12.8 g/dL (ref 12.0–15.0)
LYMPHS ABS: 1.6 10*3/uL (ref 0.7–4.0)
LYMPHS PCT: 25 %
MCH: 31 pg (ref 26.0–34.0)
MCHC: 32.2 g/dL (ref 30.0–36.0)
MCV: 96.1 fL (ref 78.0–100.0)
MONO ABS: 0.7 10*3/uL (ref 0.1–1.0)
Monocytes Relative: 11 %
Neutro Abs: 4 10*3/uL (ref 1.7–7.7)
Neutrophils Relative %: 61 %
PLATELETS: 407 10*3/uL — AB (ref 150–400)
RBC: 4.13 MIL/uL (ref 3.87–5.11)
RDW: 13.4 % (ref 11.5–15.5)
WBC: 6.6 10*3/uL (ref 4.0–10.5)

## 2014-12-18 NOTE — Progress Notes (Signed)
Labs drawn

## 2014-12-26 ENCOUNTER — Other Ambulatory Visit (HOSPITAL_COMMUNITY): Payer: Self-pay | Admitting: Hematology & Oncology

## 2015-01-01 ENCOUNTER — Ambulatory Visit (INDEPENDENT_AMBULATORY_CARE_PROVIDER_SITE_OTHER): Payer: Medicare Other | Admitting: *Deleted

## 2015-01-01 DIAGNOSIS — Z5181 Encounter for therapeutic drug level monitoring: Secondary | ICD-10-CM | POA: Diagnosis not present

## 2015-01-01 DIAGNOSIS — I482 Chronic atrial fibrillation, unspecified: Secondary | ICD-10-CM

## 2015-01-01 LAB — POCT INR: INR: 2.2

## 2015-01-12 DIAGNOSIS — M25561 Pain in right knee: Secondary | ICD-10-CM | POA: Diagnosis not present

## 2015-01-15 ENCOUNTER — Other Ambulatory Visit: Payer: Self-pay | Admitting: Cardiovascular Disease

## 2015-01-25 ENCOUNTER — Other Ambulatory Visit: Payer: Self-pay | Admitting: Orthopaedic Surgery

## 2015-01-25 DIAGNOSIS — M25561 Pain in right knee: Secondary | ICD-10-CM

## 2015-01-25 DIAGNOSIS — Z6820 Body mass index (BMI) 20.0-20.9, adult: Secondary | ICD-10-CM | POA: Diagnosis not present

## 2015-01-25 DIAGNOSIS — F17200 Nicotine dependence, unspecified, uncomplicated: Secondary | ICD-10-CM | POA: Diagnosis not present

## 2015-01-26 ENCOUNTER — Ambulatory Visit (HOSPITAL_COMMUNITY)
Admission: RE | Admit: 2015-01-26 | Discharge: 2015-01-26 | Disposition: A | Discharge status: Home / Self Care | Payer: Medicare Other | Source: Ambulatory Visit | Attending: Orthopaedic Surgery | Admitting: Orthopaedic Surgery

## 2015-01-26 DIAGNOSIS — M23221 Derangement of posterior horn of medial meniscus due to old tear or injury, right knee: Secondary | ICD-10-CM | POA: Diagnosis not present

## 2015-01-26 DIAGNOSIS — M25561 Pain in right knee: Secondary | ICD-10-CM | POA: Insufficient documentation

## 2015-01-26 DIAGNOSIS — S83241A Other tear of medial meniscus, current injury, right knee, initial encounter: Secondary | ICD-10-CM | POA: Diagnosis not present

## 2015-01-31 ENCOUNTER — Ambulatory Visit (INDEPENDENT_AMBULATORY_CARE_PROVIDER_SITE_OTHER): Payer: Medicare Other | Admitting: *Deleted

## 2015-01-31 DIAGNOSIS — I482 Chronic atrial fibrillation, unspecified: Secondary | ICD-10-CM

## 2015-01-31 DIAGNOSIS — Z5181 Encounter for therapeutic drug level monitoring: Secondary | ICD-10-CM | POA: Diagnosis not present

## 2015-01-31 LAB — POCT INR: INR: 1.8

## 2015-02-02 ENCOUNTER — Other Ambulatory Visit (HOSPITAL_COMMUNITY): Payer: Medicare Other

## 2015-02-07 ENCOUNTER — Other Ambulatory Visit: Payer: Self-pay | Admitting: Orthopaedic Surgery

## 2015-02-07 ENCOUNTER — Telehealth: Payer: Self-pay | Admitting: *Deleted

## 2015-02-07 DIAGNOSIS — F17200 Nicotine dependence, unspecified, uncomplicated: Secondary | ICD-10-CM | POA: Diagnosis not present

## 2015-02-07 DIAGNOSIS — E782 Mixed hyperlipidemia: Secondary | ICD-10-CM | POA: Diagnosis not present

## 2015-02-07 DIAGNOSIS — Z8719 Personal history of other diseases of the digestive system: Secondary | ICD-10-CM | POA: Diagnosis not present

## 2015-02-07 DIAGNOSIS — M159 Polyosteoarthritis, unspecified: Secondary | ICD-10-CM | POA: Diagnosis not present

## 2015-02-07 DIAGNOSIS — N921 Excessive and frequent menstruation with irregular cycle: Secondary | ICD-10-CM | POA: Diagnosis not present

## 2015-02-07 DIAGNOSIS — F411 Generalized anxiety disorder: Secondary | ICD-10-CM | POA: Diagnosis not present

## 2015-02-07 DIAGNOSIS — Z681 Body mass index (BMI) 19 or less, adult: Secondary | ICD-10-CM | POA: Diagnosis not present

## 2015-02-07 DIAGNOSIS — M25561 Pain in right knee: Secondary | ICD-10-CM | POA: Diagnosis not present

## 2015-02-07 NOTE — Telephone Encounter (Signed)
Dr. Luna Glasgow is needing approval for the pt to be off her Coumadin and how long does she need to be off of it. She has surgery scheduled for 02/22/15

## 2015-02-07 NOTE — Telephone Encounter (Signed)
Scheduled for knee arthroscopy with medial menisectomy by Dr Luna Glasgow 02/22/15.  CHADS2 score = 1   OK to hold coumadin 5 days prior to procedure and restart coumadin night of procedure if hemodynamically stable.

## 2015-02-09 DIAGNOSIS — E782 Mixed hyperlipidemia: Secondary | ICD-10-CM | POA: Diagnosis not present

## 2015-02-09 DIAGNOSIS — M25569 Pain in unspecified knee: Secondary | ICD-10-CM | POA: Diagnosis not present

## 2015-02-09 DIAGNOSIS — Z8572 Personal history of non-Hodgkin lymphomas: Secondary | ICD-10-CM | POA: Diagnosis not present

## 2015-02-09 DIAGNOSIS — I482 Chronic atrial fibrillation: Secondary | ICD-10-CM | POA: Diagnosis not present

## 2015-02-09 DIAGNOSIS — I10 Essential (primary) hypertension: Secondary | ICD-10-CM | POA: Diagnosis not present

## 2015-02-13 ENCOUNTER — Ambulatory Visit: Payer: Medicare Other | Admitting: Orthopedic Surgery

## 2015-02-15 NOTE — Patient Instructions (Addendum)
AZARA GEMME  02/15/2015     '@PREFPERIOPPHARMACY'$ @   Your procedure is scheduled on 02/22/2015.  Report to [facility] at 6:15 A.M.  Call this number if you have problems the morning of surgery:  [phone]   Remember:  Do not eat food or drink liquids after midnight.  Take these medicines the morning of surgery with A SIP OF WATER  Xanax, Celexa, Digoxin, Diltiazem, Hydrocodone, Metoprolol, Ultram if needed   Do not wear jewelry, make-up or nail polish.  Do not wear lotions, powders, or perfumes.  You may wear deodorant.  Do not shave 48 hours prior to surgery.  Men may shave face and neck.  Do not bring valuables to the hospital.  [facility] is not responsible for any belongings or valuables.  Contacts, dentures or bridgework may not be worn into surgery.  Leave your suitcase in the car.  After surgery it may be brought to your room.  For patients admitted to the hospital, discharge time will be determined by your treatment team.  Patients discharged the day of surgery will not be allowed to drive home.    Please read over the following fact sheets that you were given. Surgical Site Infection Prevention and Anesthesia Post-op Instructions     PATIENT INSTRUCTIONS POST-ANESTHESIA  IMMEDIATELY FOLLOWING SURGERY:  Do not drive or operate machinery for the first twenty four hours after surgery.  Do not make any important decisions for twenty four hours after surgery or while taking narcotic pain medications or sedatives.  If you develop intractable nausea and vomiting or a severe headache please notify your doctor immediately.  FOLLOW-UP:  Please make an appointment with your surgeon as instructed. You do not need to follow up with anesthesia unless specifically instructed to do so.  WOUND CARE INSTRUCTIONS (if applicable):  Keep a dry clean dressing on the anesthesia/puncture wound site if there is drainage.  Once the wound has quit draining you may leave it open to air.   Generally you should leave the bandage intact for twenty four hours unless there is drainage.  If the epidural site drains for more than 36-48 hours please call the anesthesia department.  QUESTIONS?:  Please feel free to call your physician or the hospital operator if you have any questions, and they will be happy to assist you.      Knee Arthroscopy Knee arthroscopy is a surgical procedure that is used to examine the inside of your knee joint and repair any damage. The surgeon puts a small, lighted instrument with a camera on the tip (arthroscope) through a small incision in your knee. The camera sends pictures to a monitor in the operating room. Your surgeon uses those pictures to guide the surgical instruments through other incisions to the area of damage. Knee arthroscopy can be used to treat many types of knee problems. It may be used:  To repair a torn ligament.  To repair or remove damaged tissue.  To remove a fluid-filled sac (cyst) from your knee. LET [facility] CARE PROVIDER KNOW ABOUT:  Any allergies you have.  All medicines you are taking, including vitamins, herbs, eye drops, creams, and over-the-counter medicines.  Previous problems you or members of your family have had with the use of anesthetics.  Any blood disorders you have.  Previous surgeries you have had.  Any medical conditions you may have. RISKS AND COMPLICATIONS Generally, this is a safe procedure. However, problems may occur, including:  Infection.  Bleeding.  Damage to blood vessels, nerves, or structures of your knee.  A blood clot that forms in your leg and travels to your lung.  Failure to relieve symptoms. BEFORE THE PROCEDURE  Ask your health care provider about:  Changing or stopping your regular medicines. This is especially important if you are taking diabetes medicines or blood thinners.  Taking medicines such as aspirin and ibuprofen. These medicines can thin your blood. Do not take  these medicines before your procedure if your health care provider instructs you not to.  Follow your health care provider's instructions about eating or drinking restrictions.  Plan to have someone take you home after the procedure.  If you go home right after the procedure, plan to have someone with you for 24 hours.  Do not drink alcohol unless your health care provider says that you can.  Do not use any tobacco products, including cigarettes, chewing tobacco, or electronic cigarettes unless your health care provider says that you can. If you need help quitting, ask your health care provider.  You may have a physical exam. PROCEDURE  An IV tube will be inserted into one of your veins.  You will be given one or more of the following:  A medicine that helps you relax (sedative).  A medicine that numbs the area (local anesthetic).  A medicine that makes you fall asleep (general anesthetic).  A medicine that is injected into your spine that numbs the area below and slightly above the injection site (spinal anesthetic).  A medicine that is injected into an area of your body that numbs everything below the injection site (regional anesthetic).  A cuff may be placed around your upper leg to slow bleeding during the procedure.  The surgeon will make a small number of incisions around your knee.  Your knee joint will be flushed and filled with a germ-free (sterile) solution.  The arthroscope will be passed through an incision into your knee joint.  More instruments will be passed through other incisions to repair your knee as needed.  The fluid will be removed from your knee.  The incisions will be closed with adhesive strips or stitches (sutures).  A bandage (dressing) will be placed over your knee. The procedure may vary among health care providers and hospitals. AFTER THE PROCEDURE  Your blood pressure, heart rate, breathing rate and blood oxygen level will be monitored  often until the medicines you were given have worn off.  You may be given medicine for pain.  You may get crutches to help you walk without using your knee to support your body weight.  You may have to wear compression stockings. These stocking help to prevent blood clots and reduce swelling in your legs.   This information is not intended to replace advice given to you by your health care provider. Make sure you discuss any questions you have with your health care provider.   Document Released: 01/11/2000 Document Revised: 05/30/2014 Document Reviewed: 01/09/2014 Elsevier Interactive Patient Education [employer].

## 2015-02-16 ENCOUNTER — Encounter (HOSPITAL_COMMUNITY): Payer: Self-pay

## 2015-02-16 ENCOUNTER — Encounter (HOSPITAL_COMMUNITY)
Admission: RE | Admit: 2015-02-16 | Discharge: 2015-02-16 | Disposition: A | Discharge status: Home / Self Care | Payer: Medicare Other | Source: Ambulatory Visit | Attending: Orthopaedic Surgery | Admitting: Orthopaedic Surgery

## 2015-02-16 ENCOUNTER — Other Ambulatory Visit: Payer: Self-pay

## 2015-02-16 ENCOUNTER — Other Ambulatory Visit (HOSPITAL_COMMUNITY): Payer: Self-pay | Admitting: Emergency Medicine

## 2015-02-16 DIAGNOSIS — Z01812 Encounter for preprocedural laboratory examination: Secondary | ICD-10-CM | POA: Insufficient documentation

## 2015-02-16 DIAGNOSIS — K1379 Other lesions of oral mucosa: Secondary | ICD-10-CM

## 2015-02-16 LAB — COMPREHENSIVE METABOLIC PANEL
ALT: 30 U/L (ref 14–54)
ANION GAP: 8 (ref 5–15)
AST: 33 U/L (ref 15–41)
Albumin: 3.5 g/dL (ref 3.5–5.0)
Alkaline Phosphatase: 49 U/L (ref 38–126)
BUN: 15 mg/dL (ref 6–20)
CALCIUM: 8.6 mg/dL — AB (ref 8.9–10.3)
CHLORIDE: 102 mmol/L (ref 101–111)
CO2: 29 mmol/L (ref 22–32)
CREATININE: 0.57 mg/dL (ref 0.44–1.00)
Glucose, Bld: 95 mg/dL (ref 65–99)
Potassium: 4.7 mmol/L (ref 3.5–5.1)
SODIUM: 139 mmol/L (ref 135–145)
Total Bilirubin: 0.8 mg/dL (ref 0.3–1.2)
Total Protein: 6 g/dL — ABNORMAL LOW (ref 6.5–8.1)

## 2015-02-16 LAB — CBC WITH DIFFERENTIAL/PLATELET
Basophils Absolute: 0 10*3/uL (ref 0.0–0.1)
Basophils Relative: 1 %
Eosinophils Absolute: 0.3 10*3/uL (ref 0.0–0.7)
Eosinophils Relative: 6 %
HCT: 36.3 % (ref 36.0–46.0)
Hemoglobin: 12.1 g/dL (ref 12.0–15.0)
LYMPHS PCT: 33 %
Lymphs Abs: 1.8 10*3/uL (ref 0.7–4.0)
MCH: 31.4 pg (ref 26.0–34.0)
MCHC: 33.3 g/dL (ref 30.0–36.0)
MCV: 94.3 fL (ref 78.0–100.0)
MONO ABS: 0.5 10*3/uL (ref 0.1–1.0)
MONOS PCT: 9 %
NEUTROS ABS: 2.8 10*3/uL (ref 1.7–7.7)
Neutrophils Relative %: 51 %
PLATELETS: 246 10*3/uL (ref 150–400)
RBC: 3.85 MIL/uL — AB (ref 3.87–5.11)
RDW: 14.6 % (ref 11.5–15.5)
WBC: 5.4 10*3/uL (ref 4.0–10.5)

## 2015-02-16 LAB — URINALYSIS, ROUTINE W REFLEX MICROSCOPIC
Bilirubin Urine: NEGATIVE
GLUCOSE, UA: NEGATIVE mg/dL
KETONES UR: NEGATIVE mg/dL
Nitrite: POSITIVE — AB
PROTEIN: NEGATIVE mg/dL
Specific Gravity, Urine: 1.01 (ref 1.005–1.030)
pH: 5 (ref 5.0–8.0)

## 2015-02-16 LAB — URINE MICROSCOPIC-ADD ON

## 2015-02-16 LAB — PROTIME-INR
INR: 1.95 — AB (ref 0.00–1.49)
PROTHROMBIN TIME: 22.1 s — AB (ref 11.6–15.2)

## 2015-02-16 LAB — APTT: aPTT: 34 seconds (ref 24–37)

## 2015-02-16 MED ORDER — FIRST-DUKES MOUTHWASH MT SUSP: 15.0000 mL | Freq: Four times a day (QID) | OROMUCOSAL | Status: AC | PRN

## 2015-02-19 ENCOUNTER — Telehealth: Payer: Self-pay | Admitting: *Deleted

## 2015-02-19 NOTE — Telephone Encounter (Signed)
Pls call the pt concerning her surgery on 1/26 and stopping her coumadin

## 2015-02-19 NOTE — Telephone Encounter (Signed)
Pt called this morning.  Scheduled for knee arthroscopy 1/26.  Has not stopped coumadin.  Took dose last night.  Called and discussed with Dr Luna Glasgow.  He does not feel comfortable holding coumadin just 3 days priot to surgery.  He will have pt come to his office to be rescheduled.  Pt informed.

## 2015-02-21 DIAGNOSIS — F17200 Nicotine dependence, unspecified, uncomplicated: Secondary | ICD-10-CM | POA: Diagnosis not present

## 2015-02-21 DIAGNOSIS — M25561 Pain in right knee: Secondary | ICD-10-CM | POA: Diagnosis not present

## 2015-02-21 DIAGNOSIS — M25552 Pain in left hip: Secondary | ICD-10-CM | POA: Diagnosis not present

## 2015-02-21 DIAGNOSIS — Z8719 Personal history of other diseases of the digestive system: Secondary | ICD-10-CM | POA: Diagnosis not present

## 2015-02-21 DIAGNOSIS — M25562 Pain in left knee: Secondary | ICD-10-CM | POA: Diagnosis not present

## 2015-02-21 DIAGNOSIS — Z681 Body mass index (BMI) 19 or less, adult: Secondary | ICD-10-CM | POA: Diagnosis not present

## 2015-02-22 NOTE — Patient Instructions (Signed)
Marissa Turner  02/22/2015     '@PREFPERIOPPHARMACY'$ @   Your procedure is scheduled on  02/26/2015   Report to [facility] at  615  A.M.  Call this number if you have problems the morning of surgery:  [phone]   Remember:  Do not eat food or drink liquids after midnight.  Take these medicines the morning of surgery with A SIP OF WATER  Xanax, celexa, digoxin, diltiazem, hydrocodone, metoprolol.   Do not wear jewelry, make-up or nail polish.  Do not wear lotions, powders, or perfumes.  You may wear deodorant.  Do not shave 48 hours prior to surgery.  Men may shave face and neck.  Do not bring valuables to the hospital.  [facility] is not responsible for any belongings or valuables.  Contacts, dentures or bridgework may not be worn into surgery.  Leave your suitcase in the car.  After surgery it may be brought to your room.  For patients admitted to the hospital, discharge time will be determined by your treatment team.  Patients discharged the day of surgery will not be allowed to drive home.   Name and phone number of your driver:   family Special instructions:  none  Please read over the following fact sheets that you were given. Pain Booklet, Coughing and Deep Breathing, Surgical Site Infection Prevention, Anesthesia Post-op Instructions and Care and Recovery After Surgery      Knee Arthroscopy Knee arthroscopy is a surgical procedure that is used to examine the inside of your knee joint and repair any damage. The surgeon puts a small, lighted instrument with a camera on the tip (arthroscope) through a small incision in your knee. The camera sends pictures to a monitor in the operating room. Your surgeon uses those pictures to guide the surgical instruments through other incisions to the area of damage. Knee arthroscopy can be used to treat many types of knee problems. It may be used:  To repair a torn ligament.  To repair or remove damaged tissue.  To  remove a fluid-filled sac (cyst) from your knee. LET [facility] CARE PROVIDER KNOW ABOUT:  Any allergies you have.  All medicines you are taking, including vitamins, herbs, eye drops, creams, and over-the-counter medicines.  Previous problems you or members of your family have had with the use of anesthetics.  Any blood disorders you have.  Previous surgeries you have had.  Any medical conditions you may have. RISKS AND COMPLICATIONS Generally, this is a safe procedure. However, problems may occur, including:  Infection.  Bleeding.  Damage to blood vessels, nerves, or structures of your knee.  A blood clot that forms in your leg and travels to your lung.  Failure to relieve symptoms. BEFORE THE PROCEDURE  Ask your health care provider about:  Changing or stopping your regular medicines. This is especially important if you are taking diabetes medicines or blood thinners.  Taking medicines such as aspirin and ibuprofen. These medicines can thin your blood. Do not take these medicines before your procedure if your health care provider instructs you not to.  Follow your health care provider's instructions about eating or drinking restrictions.  Plan to have someone take you home after the procedure.  If you go home right after the procedure, plan to have someone with you for 24 hours.  Do not drink alcohol unless your health care provider says that you can.  Do not use any tobacco products,  including cigarettes, chewing tobacco, or electronic cigarettes unless your health care provider says that you can. If you need help quitting, ask your health care provider.  You may have a physical exam. PROCEDURE  An IV tube will be inserted into one of your veins.  You will be given one or more of the following:  A medicine that helps you relax (sedative).  A medicine that numbs the area (local anesthetic).  A medicine that makes you fall asleep (general anesthetic).  A  medicine that is injected into your spine that numbs the area below and slightly above the injection site (spinal anesthetic).  A medicine that is injected into an area of your body that numbs everything below the injection site (regional anesthetic).  A cuff may be placed around your upper leg to slow bleeding during the procedure.  The surgeon will make a small number of incisions around your knee.  Your knee joint will be flushed and filled with a germ-free (sterile) solution.  The arthroscope will be passed through an incision into your knee joint.  More instruments will be passed through other incisions to repair your knee as needed.  The fluid will be removed from your knee.  The incisions will be closed with adhesive strips or stitches (sutures).  A bandage (dressing) will be placed over your knee. The procedure may vary among health care providers and hospitals. AFTER THE PROCEDURE  Your blood pressure, heart rate, breathing rate and blood oxygen level will be monitored often until the medicines you were given have worn off.  You may be given medicine for pain.  You may get crutches to help you walk without using your knee to support your body weight.  You may have to wear compression stockings. These stocking help to prevent blood clots and reduce swelling in your legs.   This information is not intended to replace advice given to you by your health care provider. Make sure you discuss any questions you have with your health care provider.   Document Released: 01/11/2000 Document Revised: 05/30/2014 Document Reviewed: 01/09/2014 Elsevier Interactive Patient Education 2016 [place]. Knee Arthroscopy, Care After Refer to this sheet in the next few weeks. These instructions provide you with information about caring for yourself after your procedure. Your health care provider may also give you more specific instructions. Your treatment has been planned according to  current medical practices, but problems sometimes occur. Call your health care provider if you have any problems or questions after your procedure. WHAT TO EXPECT AFTER THE PROCEDURE After your procedure, it is common to have:  Soreness.  Pain. HOME CARE INSTRUCTIONS Bathing  Do not take baths, swim, or use a hot tub until your health care provider approves. Incision Care  There are many different ways to close and cover an incision, including stitches, skin glue, and adhesive strips. Follow your health care provider's instructions about:  Incision care.  Bandage (dressing) changes and removal.  Incision closure removal.  Check your incision area every day for signs of infection. Watch for:  Redness, swelling, or pain.  Fluid, blood, or pus. Activity  Avoid strenuous activities for as long as directed by your health care provider.  Return to your normal activities as directed by your health care provider. Ask your health care provider what activities are safe for you.  Perform range-of-motion exercises only as directed by your health care provider.  Do not lift anything that is heavier than 10 lb (4.5 kg).  Do  not drive or operate heavy machinery while taking pain medicine.  If you were given crutches, use them as directed by your health care provider. Managing Pain, Stiffness, and Swelling  If directed, apply ice to the injured area:  Put ice in a plastic bag.  Place a towel between your skin and the bag.  Leave the ice on for 20 minutes, 2-3 times per day.  Raise the injured area above the level of your heart while you are sitting or lying down as directed by your health care provider. General Instructions  Keep all follow-up visits as directed by your health care provider. This is important.  Take medicines only as directed by your health care provider.  Do not use any tobacco products, including cigarettes, chewing tobacco, or electronic cigarettes. If you  need help quitting, ask your health care provider.  If you were given compression stockings, wear them as directed by your health care provider. These stockings help prevent blood clots and reduce swelling in your legs. SEEK MEDICAL CARE IF:  You have severe pain with any movement of your knee.  You notice a bad smell coming from the incision or dressing.  You have redness, swelling, or pain at the site of your incision.  You have fluid, blood, or pus coming from your incision. SEEK IMMEDIATE MEDICAL CARE IF:  You develop a rash.  You have a fever.  You have difficulty breathing or have shortness of breath.  You develop pain in your calves or in the back of your knee.  You develop chest pain.  You develop numbness or tingling in your leg or foot.   This information is not intended to replace advice given to you by your health care provider. Make sure you discuss any questions you have with your health care provider.   Document Released: 08/02/2004 Document Revised: 05/30/2014 Document Reviewed: 01/09/2014 Elsevier Interactive Patient Education 2016 [employer] PATIENT INSTRUCTIONS POST-ANESTHESIA  IMMEDIATELY FOLLOWING SURGERY:  Do not drive or operate machinery for the first twenty four hours after surgery.  Do not make any important decisions for twenty four hours after surgery or while taking narcotic pain medications or sedatives.  If you develop intractable nausea and vomiting or a severe headache please notify your doctor immediately.  FOLLOW-UP:  Please make an appointment with your surgeon as instructed. You do not need to follow up with anesthesia unless specifically instructed to do so.  WOUND CARE INSTRUCTIONS (if applicable):  Keep a dry clean dressing on the anesthesia/puncture wound site if there is drainage.  Once the wound has quit draining you may leave it open to air.  Generally you should leave the bandage intact for twenty four hours unless there is  drainage.  If the epidural site drains for more than 36-48 hours please call the anesthesia department.  QUESTIONS?:  Please feel free to call your physician or the hospital operator if you have any questions, and they will be happy to assist you.

## 2015-02-23 ENCOUNTER — Encounter (HOSPITAL_COMMUNITY): Payer: Self-pay

## 2015-02-23 ENCOUNTER — Encounter (HOSPITAL_COMMUNITY)
Admission: RE | Admit: 2015-02-23 | Discharge: 2015-02-23 | Disposition: A | Discharge status: Home / Self Care | Payer: Medicare Other | Source: Ambulatory Visit | Attending: Orthopaedic Surgery | Admitting: Orthopaedic Surgery

## 2015-02-23 DIAGNOSIS — Z7901 Long term (current) use of anticoagulants: Secondary | ICD-10-CM | POA: Diagnosis not present

## 2015-02-23 DIAGNOSIS — F329 Major depressive disorder, single episode, unspecified: Secondary | ICD-10-CM | POA: Diagnosis not present

## 2015-02-23 DIAGNOSIS — Z803 Family history of malignant neoplasm of breast: Secondary | ICD-10-CM | POA: Diagnosis not present

## 2015-02-23 DIAGNOSIS — M1711 Unilateral primary osteoarthritis, right knee: Secondary | ICD-10-CM | POA: Diagnosis not present

## 2015-02-23 DIAGNOSIS — Z800 Family history of malignant neoplasm of digestive organs: Secondary | ICD-10-CM | POA: Diagnosis not present

## 2015-02-23 DIAGNOSIS — I482 Chronic atrial fibrillation: Secondary | ICD-10-CM | POA: Diagnosis not present

## 2015-02-23 DIAGNOSIS — Z79899 Other long term (current) drug therapy: Secondary | ICD-10-CM | POA: Diagnosis not present

## 2015-02-23 DIAGNOSIS — M23203 Derangement of unspecified medial meniscus due to old tear or injury, right knee: Secondary | ICD-10-CM | POA: Diagnosis not present

## 2015-02-23 DIAGNOSIS — I10 Essential (primary) hypertension: Secondary | ICD-10-CM | POA: Diagnosis not present

## 2015-02-23 DIAGNOSIS — F17200 Nicotine dependence, unspecified, uncomplicated: Secondary | ICD-10-CM | POA: Diagnosis not present

## 2015-02-23 LAB — APTT: aPTT: 29 seconds (ref 24–37)

## 2015-02-23 LAB — PROTIME-INR
INR: 1.01 (ref 0.00–1.49)
PROTHROMBIN TIME: 13.5 s (ref 11.6–15.2)

## 2015-02-25 NOTE — H&P (Addendum)
Marissa Turner is an 75 y.o. female.    Chief Complaint: pain, giving way right HPI: she has several month history of pain, swelling, popping and giving way of her right knee.  She was seen by her primary care doctor then referred to me. I got a MRI of the right knee and it showed tear of medial meniscus as well as DJD changes more medially.  I recommended arthroscopy of the knee. I went over risks and imponderables of the outpatient procedure.  She is on coumadin and I had her contact cardiology as when to stop her coumadin.  They recommended to stop the coumadin five days prior to procedure.  She was scheduled for last Thursday but she took coumadin last 'Sunday prior to surgery.  I met with her in the office and rescheduled surgery for January 30.  She understands she is at increased risks for heart problems and associated problems when off the coumadin.  She will need a walker and physical therapy post operative.  She also understands her significant degenerative disease of the knee will not be changed and she could have increased arthritis symptoms for a while after surgery.    Past Medical History  Diagnosis Date  . Atrial fibrillation, chronic (HCC) 2004    left atrial thrombus in 2004; near syncope in 2008; echocardiogram in 2008-normal EF, mild left atrial enlargement, no valvular abnormalities, lipomatous hypertrophy; no aortic atheroma on TEE in 2004  . Chronic anticoagulation 2004    warfarin  . Tobacco abuse     40'  pack years; quit attempt in 2012  . DJD (degenerative joint disease), lumbosacral     Also hip  . Peptic ulcer disease     s/p [place] II  . Depression   . Fracture of wrist     Left  . Hypertension     Normal carotid ultrasound in 2008  . PONV (postoperative nausea and vomiting)   . Dysrhythmia     chronic AFib  . Cancer ([place])     LYMPH NODE    Past Surgical History  Procedure Laterality Date  . Cholecystectomy  2004    [place] II; splenectomy; incidental appendectomy  . Abdominal hysterectomy      w/o oophorectomy  . Cesarean section    . Tonsillectomy    . Colonoscopy  1980s    patient denies ever having undergone colonoscopy as of 2014  . Appendectomy    . Splenectomy, total    . Lymph node biopsy Left 09/15/2013    Procedure: LEFT NECK CERVICAL NODE BIOPSY;  Surgeon: Scherry Ran, MD;  Location: [facility];  Service: General;  Laterality: Left;  . Portacath placement Right 10/21/2013    Procedure: ATTEMPTED INSERTION PORT-A-CATH;  Surgeon: Scherry Ran, MD;  Location: [facility];  Service: General;  Laterality: Right;  . Port-a-cath removal Right 07/12/2014    Procedure: REMOVAL PORT-A-CATH;  Surgeon: Aviva Signs Md, MD;  Location: [facility];  Service: General;  Laterality: Right;    Family History  Problem Relation Age of Onset  . Heart attack Mother     also brother  . Breast cancer Sister   . Heart attack Brother   . Breast cancer Sister     x2  . Cirrhosis Sister   . Cancer Brother     Gastric carcinoma; also father   Social History:  reports that she has been smoking Cigarettes.  She has  a 13.75 pack-year smoking history. She does not have any smokeless tobacco history on file. She reports that she does not drink alcohol or use illicit drugs.  Allergies:  Allergies  Allergen Reactions  . Diphenhydramine Hcl Other (See Comments)    hallucinations   . Estrogens Other (See Comments)    edema  . Penicillins Other (See Comments)    Break out in whelps.  . Codeine Swelling and Rash    No prescriptions prior to admission    Results for orders placed or performed during the hospital encounter of 02/23/15 (from the past 48 hour(s))  Protime-INR     Status: None   Collection Time: 02/23/15  1:30 PM  Result Value Ref Range   Prothrombin Time 13.5 11.6 - 15.2 seconds   INR 1.01 0.00 - 1.49  APTT     Status: None   Collection Time: 02/23/15  1:30 PM  Result Value Ref Range    aPTT 29 24 - 37 seconds   No results found.  Review of Systems  Respiratory:       Long history cigarrette smoking  Cardiovascular: Positive for palpitations. Negative for leg swelling.       History of chronic atrial fib.  On coumadin.  Hypertension.  Gastrointestinal: Positive for heartburn.       Post spleenectomy, appendectomy, gall bladder removal.  Musculoskeletal: Positive for back pain and joint pain (right knee pain, 2+effusion, giving way, crepitus, pain).  Endo/Heme/Allergies: Bruises/bleeds easily.       History lymphoma    There were no vitals taken for this visit. Physical Exam  Constitutional: She is oriented to person, place, and time. She appears well-developed and well-nourished.  HENT:  Head: Normocephalic and atraumatic.  Eyes: Conjunctivae and EOM are normal. Pupils are equal, round, and reactive to light.  Neck: Normal range of motion. Neck supple.  Cardiovascular:  Irregular irregular rhythm  Respiratory: Effort normal and breath sounds normal.  GI: Soft.  Musculoskeletal: She exhibits tenderness (pain, crepitus, effusion 2+,lright knee, positive medial McMurray ).  Neurological: She is alert and oriented to person, place, and time.  Skin: Skin is warm and dry.  Psychiatric: She has a normal mood and affect. Her behavior is normal. Judgment and thought content normal.     Assessment/Plan Tear medial meniscus right knee, for outpatient arthroscopy Significant DJD right knee, more medially Atrial fib on coumadin chronically Smoker Hypertension History of lymphoma Post removal of spleen   Moani Weipert 02/25/2015, 9:17 AM

## 2015-02-26 ENCOUNTER — Ambulatory Visit (HOSPITAL_COMMUNITY): Payer: Medicare Other | Admitting: Anesthesiology

## 2015-02-26 ENCOUNTER — Encounter (HOSPITAL_COMMUNITY)
Admission: RE | Disposition: A | Discharge status: Home / Self Care | Payer: Self-pay | Source: Ambulatory Visit | Attending: Orthopaedic Surgery

## 2015-02-26 ENCOUNTER — Ambulatory Visit (HOSPITAL_COMMUNITY)
Admission: RE | Admit: 2015-02-26 | Discharge: 2015-02-26 | Disposition: A | Discharge status: Home / Self Care | Payer: Medicare Other | Source: Ambulatory Visit | Attending: Orthopaedic Surgery | Admitting: Orthopaedic Surgery

## 2015-02-26 ENCOUNTER — Ambulatory Visit (HOSPITAL_COMMUNITY): Payer: Medicare Other

## 2015-02-26 ENCOUNTER — Encounter (HOSPITAL_COMMUNITY): Payer: Self-pay | Admitting: Anesthesiology

## 2015-02-26 DIAGNOSIS — F329 Major depressive disorder, single episode, unspecified: Secondary | ICD-10-CM | POA: Insufficient documentation

## 2015-02-26 DIAGNOSIS — Z800 Family history of malignant neoplasm of digestive organs: Secondary | ICD-10-CM | POA: Insufficient documentation

## 2015-02-26 DIAGNOSIS — M23203 Derangement of unspecified medial meniscus due to old tear or injury, right knee: Secondary | ICD-10-CM | POA: Insufficient documentation

## 2015-02-26 DIAGNOSIS — Z7901 Long term (current) use of anticoagulants: Secondary | ICD-10-CM | POA: Insufficient documentation

## 2015-02-26 DIAGNOSIS — I482 Chronic atrial fibrillation: Secondary | ICD-10-CM | POA: Diagnosis not present

## 2015-02-26 DIAGNOSIS — S83241A Other tear of medial meniscus, current injury, right knee, initial encounter: Secondary | ICD-10-CM | POA: Diagnosis not present

## 2015-02-26 DIAGNOSIS — Z79899 Other long term (current) drug therapy: Secondary | ICD-10-CM | POA: Insufficient documentation

## 2015-02-26 DIAGNOSIS — M23303 Other meniscus derangements, unspecified medial meniscus, right knee: Secondary | ICD-10-CM | POA: Diagnosis not present

## 2015-02-26 DIAGNOSIS — I10 Essential (primary) hypertension: Secondary | ICD-10-CM | POA: Insufficient documentation

## 2015-02-26 DIAGNOSIS — F17200 Nicotine dependence, unspecified, uncomplicated: Secondary | ICD-10-CM | POA: Insufficient documentation

## 2015-02-26 DIAGNOSIS — M1711 Unilateral primary osteoarthritis, right knee: Secondary | ICD-10-CM | POA: Diagnosis not present

## 2015-02-26 DIAGNOSIS — Z803 Family history of malignant neoplasm of breast: Secondary | ICD-10-CM | POA: Insufficient documentation

## 2015-02-26 HISTORY — PX: KNEE ARTHROSCOPY WITH MEDIAL MENISECTOMY: SHX5651

## 2015-02-26 SURGERY — ARTHROSCOPY, KNEE, WITH MEDIAL MENISCECTOMY
Anesthesia: General | Laterality: Right

## 2015-02-26 MED ORDER — LIDOCAINE HCL (CARDIAC) 20 MG/ML IV SOLN
INTRAVENOUS | Status: DC | PRN
  Administered 2015-02-26: 110 mg via INTRAVENOUS
  Administered 2015-02-26: 20 mg via INTRAVENOUS

## 2015-02-26 MED ORDER — EPHEDRINE SULFATE 50 MG/ML IJ SOLN
INTRAMUSCULAR | Status: AC
  Filled 2015-02-26: qty 1

## 2015-02-26 MED ORDER — ONDANSETRON HCL 4 MG/2ML IJ SOLN
4.0000 mg | Freq: Once | INTRAMUSCULAR | Status: AC
  Administered 2015-02-26: 4 mg via INTRAVENOUS

## 2015-02-26 MED ORDER — EPHEDRINE SULFATE 50 MG/ML IJ SOLN
INTRAMUSCULAR | Status: DC | PRN
  Administered 2015-02-26: 10 mg via INTRAVENOUS

## 2015-02-26 MED ORDER — LACTATED RINGERS IR SOLN
Status: DC | PRN
  Administered 2015-02-26 (×3): 3000 mL

## 2015-02-26 MED ORDER — LIDOCAINE HCL (PF) 1 % IJ SOLN
INTRAMUSCULAR | Status: AC
  Filled 2015-02-26: qty 5

## 2015-02-26 MED ORDER — ONDANSETRON HCL 4 MG/2ML IJ SOLN: 4.0000 mg | Freq: Once | INTRAMUSCULAR | Status: DC | PRN

## 2015-02-26 MED ORDER — MIDAZOLAM HCL 2 MG/2ML IJ SOLN
1.0000 mg | INTRAMUSCULAR | Status: DC | PRN
  Administered 2015-02-26: 2 mg via INTRAVENOUS

## 2015-02-26 MED ORDER — LACTATED RINGERS IV SOLN
INTRAVENOUS | Status: DC
  Administered 2015-02-26: 07:00:00 via INTRAVENOUS

## 2015-02-26 MED ORDER — ONDANSETRON HCL 4 MG/2ML IJ SOLN
INTRAMUSCULAR | Status: AC
  Filled 2015-02-26: qty 2

## 2015-02-26 MED ORDER — LACTATED RINGERS IV SOLN
INTRAVENOUS | Status: DC | PRN
  Administered 2015-02-26: 07:00:00 via INTRAVENOUS

## 2015-02-26 MED ORDER — FENTANYL CITRATE (PF) 100 MCG/2ML IJ SOLN
25.0000 ug | INTRAMUSCULAR | Status: DC | PRN
  Administered 2015-02-26: 25 ug via INTRAVENOUS
  Administered 2015-02-26: 50 ug via INTRAVENOUS
  Filled 2015-02-26: qty 2

## 2015-02-26 MED ORDER — FENTANYL CITRATE (PF) 100 MCG/2ML IJ SOLN
INTRAMUSCULAR | Status: AC
  Filled 2015-02-26: qty 2

## 2015-02-26 MED ORDER — BUPIVACAINE HCL (PF) 0.25 % IJ SOLN
INTRAMUSCULAR | Status: AC
  Filled 2015-02-26: qty 30

## 2015-02-26 MED ORDER — SCOPOLAMINE 1 MG/3DAYS TD PT72
MEDICATED_PATCH | TRANSDERMAL | Status: AC
  Filled 2015-02-26: qty 1

## 2015-02-26 MED ORDER — PROPOFOL 10 MG/ML IV BOLUS
INTRAVENOUS | Status: AC
  Filled 2015-02-26: qty 40

## 2015-02-26 MED ORDER — SCOPOLAMINE 1 MG/3DAYS TD PT72
1.0000 | MEDICATED_PATCH | Freq: Once | TRANSDERMAL | Status: DC
  Administered 2015-02-26: 1.5 mg via TRANSDERMAL

## 2015-02-26 MED ORDER — BUPIVACAINE HCL (PF) 0.25 % IJ SOLN
INTRAMUSCULAR | Status: DC | PRN
  Administered 2015-02-26: 30 mL

## 2015-02-26 MED ORDER — SODIUM CHLORIDE 0.9 % IJ SOLN
INTRAMUSCULAR | Status: AC
  Filled 2015-02-26: qty 10

## 2015-02-26 MED ORDER — DEXAMETHASONE SODIUM PHOSPHATE 4 MG/ML IJ SOLN
INTRAMUSCULAR | Status: AC
  Filled 2015-02-26: qty 1

## 2015-02-26 MED ORDER — DEXAMETHASONE SODIUM PHOSPHATE 4 MG/ML IJ SOLN
4.0000 mg | Freq: Once | INTRAMUSCULAR | Status: AC
  Administered 2015-02-26: 4 mg via INTRAVENOUS

## 2015-02-26 MED ORDER — SUCCINYLCHOLINE CHLORIDE 20 MG/ML IJ SOLN
INTRAMUSCULAR | Status: AC
  Filled 2015-02-26: qty 1

## 2015-02-26 MED ORDER — CHLORHEXIDINE GLUCONATE 4 % EX LIQD: 60.0000 mL | Freq: Once | CUTANEOUS | Status: DC

## 2015-02-26 MED ORDER — FENTANYL CITRATE (PF) 100 MCG/2ML IJ SOLN
INTRAMUSCULAR | Status: DC | PRN
  Administered 2015-02-26 (×2): 25 ug via INTRAVENOUS
  Administered 2015-02-26: 50 ug via INTRAVENOUS

## 2015-02-26 MED ORDER — MIDAZOLAM HCL 2 MG/2ML IJ SOLN
INTRAMUSCULAR | Status: AC
  Filled 2015-02-26: qty 2

## 2015-02-26 SURGICAL SUPPLY — 55 items
BAG HAMPER (MISCELLANEOUS) ×3 IMPLANT
BANDAGE ELASTIC 6 VELCRO NS (GAUZE/BANDAGES/DRESSINGS) ×3 IMPLANT
BANDAGE ESMARK 4X12 BL STRL LF (DISPOSABLE) ×1 IMPLANT
BLADE SURG 15 STRL LF DISP TIS (BLADE) IMPLANT
BLADE SURG 15 STRL SS (BLADE)
BLADE SURG SZ11 CARB STEEL (BLADE) ×3 IMPLANT
BNDG ESMARK 4X12 BLUE STRL LF (DISPOSABLE) ×3
CLOTH BEACON ORANGE TIMEOUT ST (SAFETY) ×3 IMPLANT
CUFF TOURNIQUET SINGLE 34IN LL (TOURNIQUET CUFF) ×3 IMPLANT
CUFF TOURNIQUET SINGLE 44IN (TOURNIQUET CUFF) IMPLANT
CUTTER TOMCAT 4.0M (BURR) ×3 IMPLANT
DECANTER SPIKE VIAL GLASS SM (MISCELLANEOUS) ×3 IMPLANT
DRAPE PROXIMA HALF (DRAPES) ×3 IMPLANT
DRSG XEROFORM 1X8 (GAUZE/BANDAGES/DRESSINGS) ×3 IMPLANT
DURAPREP 26ML APPLICATOR (WOUND CARE) ×3 IMPLANT
ELECT MENISCUS 165MM 90D (ELECTRODE) IMPLANT
FORMALIN 10 PREFIL 480ML (MISCELLANEOUS) ×3 IMPLANT
GAUZE SPONGE 4X4 12PLY STRL (GAUZE/BANDAGES/DRESSINGS) ×3 IMPLANT
GAUZE SPONGE 4X4 16PLY XRAY LF (GAUZE/BANDAGES/DRESSINGS) ×3 IMPLANT
GAUZE XEROFORM 5X9 LF (GAUZE/BANDAGES/DRESSINGS) ×3 IMPLANT
GLOVE BIO SURGEON STRL SZ8 (GLOVE) ×3 IMPLANT
GLOVE BIO SURGEON STRL SZ8.5 (GLOVE) ×3 IMPLANT
GLOVE BIOGEL PI IND STRL 7.0 (GLOVE) ×2 IMPLANT
GLOVE BIOGEL PI INDICATOR 7.0 (GLOVE) ×4
GOWN STRL REUS W/TWL LRG LVL3 (GOWN DISPOSABLE) ×6 IMPLANT
GOWN STRL REUS W/TWL XL LVL3 (GOWN DISPOSABLE) ×3 IMPLANT
HANDPIECE (MISCELLANEOUS) IMPLANT
HLDR LEG FOAM (MISCELLANEOUS) ×1 IMPLANT
IV LACTATED RINGER IRRG 3000ML (IV SOLUTION) ×6
IV LR IRRIG 3000ML ARTHROMATIC (IV SOLUTION) ×3 IMPLANT
KIT BLADEGUARD II DBL (SET/KITS/TRAYS/PACK) ×3 IMPLANT
KIT ROOM TURNOVER AP CYSTO (KITS) ×3 IMPLANT
KNIFE HOOK (BLADE) IMPLANT
KNIFE MENISECTOMY (BLADE) IMPLANT
LEG HOLDER FOAM (MISCELLANEOUS) ×2
MANIFOLD NEPTUNE II (INSTRUMENTS) ×3 IMPLANT
MARKER SKIN DUAL TIP RULER LAB (MISCELLANEOUS) ×3 IMPLANT
NEEDLE HYPO 21X1.5 SAFETY (NEEDLE) ×3 IMPLANT
NEEDLE SPNL 18GX3.5 QUINCKE PK (NEEDLE) ×3 IMPLANT
NS IRRIG 1000ML POUR BTL (IV SOLUTION) ×3 IMPLANT
PACK ARTHRO LIMB DRAPE STRL (MISCELLANEOUS) ×3 IMPLANT
PAD ABD 5X9 TENDERSORB (GAUZE/BANDAGES/DRESSINGS) ×3 IMPLANT
PAD ARMBOARD 7.5X6 YLW CONV (MISCELLANEOUS) ×3 IMPLANT
PADDING CAST COTTON 6X4 STRL (CAST SUPPLIES) ×3 IMPLANT
PADDING WEBRIL 6 STERILE (GAUZE/BANDAGES/DRESSINGS) ×3 IMPLANT
SET ARTHROSCOPY INST (INSTRUMENTS) ×3 IMPLANT
SET BASIN LINEN APH (SET/KITS/TRAYS/PACK) ×3 IMPLANT
SPONGE GAUZE 4X4 12PLY (GAUZE/BANDAGES/DRESSINGS) ×2 IMPLANT
SUT ETHILON 3 0 FSL (SUTURE) ×3 IMPLANT
SYR 30ML LL (SYRINGE) ×3 IMPLANT
TIP 0 DEGREE (MISCELLANEOUS) IMPLANT
TIP 30 DEGREE (MISCELLANEOUS) IMPLANT
TIP 70 DEGREE (MISCELLANEOUS) IMPLANT
TUBING FLOSTEADY ARTHRO PUMP (IRRIGATION / IRRIGATOR) ×3 IMPLANT
YANKAUER SUCT BULB TIP 10FT TU (MISCELLANEOUS) ×6 IMPLANT

## 2015-02-26 NOTE — Transfer of Care (Signed)
Immediate Anesthesia Transfer of Care Note  Patient: Marissa Turner  Procedure(s) Performed: Procedure(s): KNEE ARTHROSCOPY WITH MEDIAL MENISECTOMY (Right)  Patient Location: PACU  Anesthesia Type:General  Level of Consciousness: awake, alert , oriented and patient cooperative  Airway & Oxygen Therapy: Patient Spontanous Breathing and Patient connected to face mask oxygen  Post-op Assessment: Report given to RN and Post -op Vital signs reviewed and stable  Post vital signs: Reviewed and stable  Last Vitals:  Filed Vitals:   02/26/15 0700 02/26/15 0705  BP: 118/71 130/73  Pulse:    Temp:    Resp: 20 19    Complications: No apparent anesthesia complications

## 2015-02-26 NOTE — Op Note (Signed)
NAMENIEVE, ROJERO                ACCOUNT NO.:  [id number]  MEDICAL RECORD NO.:  [id number]  LOCATION:  [facility]                          FACILITY:  [facility]  PHYSICIAN:  J. Sanjuana Kava, M.D. DATE OF BIRTH:  [birth year]  DATE OF PROCEDURE: DATE OF DISCHARGE:  02/26/2015                              OPERATIVE REPORT   PREOPERATIVE DIAGNOSES: 1. Tear in medial meniscus, right knee. 2. Degenerative joint disease significantly right medial knee.  POSTOPERATIVE DIAGNOSES: 1. Tear in medial meniscus, right knee. 2. Degenerative joint disease significantly right medial knee.  PROCEDURE:  Operative arthroscopy of the right knee, partial medial meniscectomy.  ANESTHESIA:  General.  TOURNIQUET TIME:  20 minutes,  DRAINS:  No drains.  SURGEON:  J. Sanjuana Kava, MD.  INDICATIONS:  The patient is a 75 year old female with pain and tenderness in her right knee with giving way, falling, pain, crepitus, effusion.  She had not improved with conservative treatment.  MRI shows tear of the medial meniscus of the right knee with significant degenerative joint disease medially.  I recommended arthroscopy. Eventually, she may need a total knee.  The patient has atrial fibrillation and is on Coumadin and has other significant medical problems.  She was originally scheduled for surgery this past Thursday and she is supposed to be off Coumadin for at least 5 days according to her cardiologist and the Coumadin lab of the cardiologist.  She took a pill on Sunday evening last week, so we had to postpone surgery until today.  Her INR and protime levels are normal today.  She understands she is at increased risk for being off the Coumadin which she will start that back later tonight.  I had gone over the risks and imponderables of this procedure.  She appeared to understand and agreed to procedure as outlined.  She understands elective procedure done as an outpatient. She will need physical therapy.  This  has been arranged.  DESCRIPTION OF PROCEDURE:  The patient was seen in the holding area and the right knee was identified as correct surgical site.  I placed a mark on the right knee, she placed a mark on the right knee.  She was brought back to the operating room, placed supine on the operating room table. She was given general anesthesia.  Leg holder and tourniquet placed and deflated right upper thigh.  Her right knee and lower leg were prepped and draped in usual manner.  I had a generalized time-out identifying the patient as Ms. Power and we were doing a right knee for right knee arthroscopy and medial meniscectomy.  All instrumentation was properly positioned and working and the OR team knew each other.  The leg was elevated, wrapped circumferentially with an Esmarch bandage. Tourniquet inflated to 300 mmHg. Esmarch bandage removed.  Inflow cannula inserted medially and lactated Ringer instilled into the knee by an infusion pump.  Arthroscope inserted laterally.  The knee was systematically examined.  Permanent pictures were taken.  Undersurface of the patella had significant grade 4 changes, particularly on the lateral portion and the central portion.  The medial meniscus had a significant stellate diffuse tear with marked eburnation of bone and grade 4  changes medially for about 3/4 of the medial side of the knee. There were no loose bodies, but there was  small fragments floating. Anterior cruciate was intact,  Laterally, she had grade 2 to grade 3 changes and some fraying of the cartilage, but no tear of the cartilage or the meniscus and there were no fragments or loose bodies laterally. Attention directed back to the medial side using meniscal shaver and meniscal punch, good smooth contour was obtained.  Permanent pictures were again taken.  Once the meniscus was removed and smoothed, the knee was re-examined and no new pathology found.  Knee was irrigated with main part of  lactated Ringer's.  Marcaine 0.25% was instilled in each portal after I had used 3-0 nylon interrupted vertical mattress to close each portal.  Tourniquet was deflated for 20 minutes.  The patient tolerated procedure well, will go to recovery in good condition. She already has Toradol for pain.  She is allergic to codeine and cannot take that.  She will go to physical therapy later this week it has already been setup.  If she has any difficulties, she is to contact me through office hospital beeper system.  Otherwise, she will be seen in the office in approximately 10 days to 2 weeks.          ______________________________ Lenna Sciara. Sanjuana Kava, M.D.     JWK/MEDQ  D:  02/26/2015  T:  02/26/2015  Job:  [id number]

## 2015-02-26 NOTE — Discharge Instructions (Signed)
Use walker.  Weight bear as tolerated on the right knee.  Use ice, elevate as needed.  You may remove dressing when you desire.  You may cleanse the knee with peroxide, otherwise keep the knee dry.  Apply Band-aids to the stitches.  Call if any problem.  Keep physical therapy appointments.  See Dr. Luna Glasgow in about 10 days at his office.  If any problem, call the office at [phone] or if after hours, the hospital at [phone].  You may resume your Coumadin tonight.  Contact cardiologist office about checking your bleeding times again.

## 2015-02-26 NOTE — Anesthesia Procedure Notes (Signed)
Procedure Name: LMA Insertion Date/Time: 02/26/2015 7:36 AM Performed by: Andree Elk, AMY A Pre-anesthesia Checklist: Patient identified, Timeout performed, Emergency Drugs available, Suction available and Patient being monitored Patient Re-evaluated:Patient Re-evaluated prior to inductionOxygen Delivery Method: Circle system utilized Intubation Type: IV induction Ventilation: Mask ventilation without difficulty LMA: LMA inserted LMA Size: 3.0 Number of attempts: 1 Placement Confirmation: positive ETCO2 and breath sounds checked- equal and bilateral Tube secured with: Tape Dental Injury: Teeth and Oropharynx as per pre-operative assessment

## 2015-02-26 NOTE — Anesthesia Postprocedure Evaluation (Signed)
Anesthesia Post Note  Patient: Marissa Turner  Procedure(s) Performed: Procedure(s) (LRB): KNEE ARTHROSCOPY WITH MEDIAL MENISECTOMY (Right)  Patient location during evaluation: PACU Anesthesia Type: General Level of consciousness: awake and alert and oriented Pain management: pain level controlled Vital Signs Assessment: post-procedure vital signs reviewed and stable Respiratory status: spontaneous breathing and patient connected to face mask oxygen Cardiovascular status: stable Postop Assessment: no signs of nausea or vomiting Anesthetic complications: no    Last Vitals:  Filed Vitals:   02/26/15 0700 02/26/15 0705  BP: 118/71 130/73  Pulse:    Temp:    Resp: 20 19    Last Pain: There were no vitals filed for this visit.               Korey Arroyo A

## 2015-02-26 NOTE — Progress Notes (Signed)
The History and Physical is unchanged. I have examined the patient. The patient is medically able to have surgery on the right knee . Marissa Turner 

## 2015-02-26 NOTE — Brief Op Note (Signed)
02/26/2015  8:16 AM  PATIENT:  Marissa Turner  75 y.o. female  PRE-OPERATIVE DIAGNOSIS:  right knee medial meniscal tear  POST-OPERATIVE DIAGNOSIS:  right knee medial meniscal tear, degenerative joint disease  PROCEDURE:  Procedure(s): KNEE ARTHROSCOPY WITH MEDIAL MENISECTOMY (Right)  SURGEON:  Surgeon(s) and Role:    * Sanjuana Kava, MD - Primary  PHYSICIAN ASSISTANT:   ASSISTANTS: none   ANESTHESIA:   general  EBL:  Total I/O In: 700 [I.V.:700] Out: 5 [Blood:5]  BLOOD ADMINISTERED:none  DRAINS: none   LOCAL MEDICATIONS USED:  MARCAINE     SPECIMEN:  Source of Specimen:  right medial meniscus shavings  DISPOSITION OF SPECIMEN:  PATHOLOGY  COUNTS:  YES  TOURNIQUET:   Total Tourniquet Time Documented: Thigh (Right) - 20 minutes Total: Thigh (Right) - 20 minutes   DICTATION: .Other Dictation: Dictation Number [id number]  PLAN OF CARE: Discharge to home after PACU  PATIENT DISPOSITION:  PACU - hemodynamically stable.   Delay start of Pharmacological VTE agent (>24hrs) due to surgical blood loss or risk of bleeding: not applicable

## 2015-02-26 NOTE — Anesthesia Preprocedure Evaluation (Signed)
Anesthesia Evaluation  Patient identified by MRN, date of birth, ID band Patient awake    Reviewed: Allergy & Precautions, H&P , NPO status , Patient's Chart, lab work & pertinent test results  History of Anesthesia Complications (+) PONV and history of anesthetic complications  Airway Mallampati: II  TM Distance: >3 FB Neck ROM: Full    Dental  (+) Edentulous Upper, Edentulous Lower   Pulmonary Current Smoker,    breath sounds clear to auscultation       Cardiovascular hypertension, Pt. on medications + dysrhythmias Atrial Fibrillation  Rhythm:Regular Rate:Normal     Neuro/Psych PSYCHIATRIC DISORDERS Depression    GI/Hepatic PUD,   Endo/Other    Renal/GU      Musculoskeletal   Abdominal   Peds  Hematology   Anesthesia Other Findings   Reproductive/Obstetrics                             Anesthesia Physical Anesthesia Plan  ASA: III  Anesthesia Plan: General   Post-op Pain Management:    Induction: Intravenous  Airway Management Planned: LMA  Additional Equipment:   Intra-op Plan:   Post-operative Plan: Extubation in OR  Informed Consent: I have reviewed the patients History and Physical, chart, labs and discussed the procedure including the risks, benefits and alternatives for the proposed anesthesia with the patient or authorized representative who has indicated his/her understanding and acceptance.     Plan Discussed with:   Anesthesia Plan Comments:         Anesthesia Quick Evaluation

## 2015-02-27 ENCOUNTER — Encounter (HOSPITAL_COMMUNITY): Payer: Self-pay | Admitting: Orthopaedic Surgery

## 2015-02-27 DIAGNOSIS — M25561 Pain in right knee: Secondary | ICD-10-CM | POA: Diagnosis not present

## 2015-03-01 ENCOUNTER — Ambulatory Visit (HOSPITAL_COMMUNITY): Payer: Medicare Other | Attending: Orthopaedic Surgery | Admitting: Physical Therapy

## 2015-03-01 ENCOUNTER — Telehealth: Payer: Self-pay | Admitting: *Deleted

## 2015-03-01 DIAGNOSIS — Z9889 Other specified postprocedural states: Secondary | ICD-10-CM | POA: Insufficient documentation

## 2015-03-01 DIAGNOSIS — R600 Localized edema: Secondary | ICD-10-CM | POA: Diagnosis not present

## 2015-03-01 DIAGNOSIS — R262 Difficulty in walking, not elsewhere classified: Secondary | ICD-10-CM | POA: Insufficient documentation

## 2015-03-01 DIAGNOSIS — M6281 Muscle weakness (generalized): Secondary | ICD-10-CM | POA: Diagnosis not present

## 2015-03-01 DIAGNOSIS — M25561 Pain in right knee: Secondary | ICD-10-CM | POA: Diagnosis not present

## 2015-03-01 DIAGNOSIS — M25661 Stiffness of right knee, not elsewhere classified: Secondary | ICD-10-CM | POA: Diagnosis not present

## 2015-03-01 NOTE — Telephone Encounter (Signed)
pls call the pt concerning how much coumadin she should be taking now--pt gave permission to leave message on answering machine

## 2015-03-01 NOTE — Telephone Encounter (Signed)
Spoke with pt.  Told her to continue coumadin '5mg'$  daily except 2.'5mg'$  on Mondays and Fridays and come for INR check on 03/05/15.  Pt verbalized understanding.
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
## 2015-03-01 NOTE — Patient Instructions (Signed)
Quad Set    With other leg bent, foot flat, slowly tighten muscles on thigh of straight leg while counting out loud to __2__. Repeat with other leg. Repeat __10-15__ times. Do _2-3___ sessions per day.  http://gt2.exer.us/276   Copyright  VHI. All rights reserved.   Toe / Heel Raise    Gently rock back on heels and raise toes. Then rock forward on toes and raise heels. Repeat sequence __10-15__ times per session. Do __2-3__ sessions per day.  Copyright  VHI. All rights reserved.     WEIGHT SHIFT - LATERAL  While in a standing position and knees partially bent, slowly shift your body weight side-to-side.  Make sure you are putting as much weight as you can tolerate through the right side.  Repeat for 2 minutes at a time, 2-3 times per day.    ELASTIC BAND - HAMSTRING CURL (NO BAND)  Sitting on the edge of a chair or your bed, bend your right knee back as far as you can using just the muscles; then use your left leg to assist in pushing the R knee as far bent as you can tolerate. Hold for 1-2 seconds, then release.   Repeat 10 times, 2-3 times per day.

## 2015-03-02 ENCOUNTER — Ambulatory Visit (HOSPITAL_COMMUNITY): Payer: Medicare Other | Admitting: Physical Therapy

## 2015-03-02 DIAGNOSIS — M6281 Muscle weakness (generalized): Secondary | ICD-10-CM | POA: Diagnosis not present

## 2015-03-02 DIAGNOSIS — Z9889 Other specified postprocedural states: Secondary | ICD-10-CM | POA: Diagnosis not present

## 2015-03-02 DIAGNOSIS — R600 Localized edema: Secondary | ICD-10-CM | POA: Diagnosis not present

## 2015-03-02 DIAGNOSIS — M25661 Stiffness of right knee, not elsewhere classified: Secondary | ICD-10-CM

## 2015-03-02 DIAGNOSIS — R262 Difficulty in walking, not elsewhere classified: Secondary | ICD-10-CM | POA: Diagnosis not present

## 2015-03-02 DIAGNOSIS — M25561 Pain in right knee: Secondary | ICD-10-CM

## 2015-03-02 NOTE — Therapy (Signed)
[facility] [place], [place], [address] Phone: [phone]   Fax:  [phone]  Physical Therapy Treatment  Patient Details  Name: HARLEIGH CIVELLO MRN: [id number] Date of Birth: [birth year] Referring Provider: Sanjuana Kava MD   Encounter Date: 03/02/2015      PT End of Session - 03/02/15 1218    Visit Number 2   Number of Visits 12   Date for PT Re-Evaluation 03/29/15   Authorization Type Medicare/[employer]    Authorization Time Period 03/01/15 to 04/29/15   Authorization - Visit Number 2   Authorization - Number of Visits 10   PT Start Time 9244   PT Stop Time 1231   PT Time Calculation (min) 38 min   Activity Tolerance Patient tolerated treatment well      Past Medical History  Diagnosis Date  . Atrial fibrillation, chronic ([place]) 2004    left atrial thrombus in 2004; near syncope in 2008; echocardiogram in 2008-normal EF, mild left atrial enlargement, no valvular abnormalities, lipomatous hypertrophy; no aortic atheroma on TEE in 2004  . Chronic anticoagulation 2004    warfarin  . Tobacco abuse     40 pack years; quit attempt in 2012  . DJD (degenerative joint disease), lumbosacral     Also hip  . Peptic ulcer disease     s/p Billroth II  . Depression   . Fracture of wrist     Left  . Hypertension     Normal carotid ultrasound in 2008  . PONV (postoperative nausea and vomiting)   . Dysrhythmia     chronic AFib  . Cancer ([place])     LYMPH NODE    Past Surgical History  Procedure Laterality Date  . Cholecystectomy  2004    [place] II; splenectomy; incidental appendectomy  . Abdominal hysterectomy      w/o oophorectomy  . Cesarean section    . Tonsillectomy    . Colonoscopy  1980s    patient denies ever having undergone colonoscopy as of 2014  . Appendectomy    . Splenectomy, total    . Lymph node biopsy Left 09/15/2013    Procedure: LEFT NECK  CERVICAL NODE BIOPSY;  Surgeon: Scherry Ran, MD;  Location: [facility];  Service: General;  Laterality: Left;  . Portacath placement Right 10/21/2013    Procedure: ATTEMPTED INSERTION PORT-A-CATH;  Surgeon: Scherry Ran, MD;  Location: [facility];  Service: General;  Laterality: Right;  . Port-a-cath removal Right 07/12/2014    Procedure: REMOVAL PORT-A-CATH;  Surgeon: Aviva Signs Md, MD;  Location: [facility];  Service: General;  Laterality: Right;  . Knee arthroscopy with medial menisectomy Right 02/26/2015    Procedure: KNEE ARTHROSCOPY WITH MEDIAL MENISECTOMY;  Surgeon: Sanjuana Kava, MD;  Location: [facility];  Service: Orthopedics;  Laterality: Right;    There were no vitals filed for this visit.  Visit Diagnosis:  S/P right knee arthroscopy  Knee pain, right  Stiffness of right knee  Difficulty walking  Local edema  Muscle weakness      Subjective Assessment - 03/02/15 1155    Subjective Pt states she has taken a pain pill so she is not having any pain.    Currently in Pain? No/denies                         [facility] Adult PT Treatment/Exercise - 03/02/15  0001    Exercises   Exercises Knee/Hip   Knee/Hip Exercises: Stretches   Active Hamstring Stretch Right;2 reps;30 seconds   Passive Hamstring Stretch Right;2 reps;30 seconds   Knee: Self-Stretch to increase Flexion Right;5 reps   Knee/Hip Exercises: Standing   Heel Raises Both;10 reps   Heel Raises Limitations toe raises   Knee Flexion Strengthening;Right;10 reps   Rocker Board 2 minutes   SLS x3 one hand hold    Other Standing Knee Exercises Rt hip abduction and extension    Knee/Hip Exercises: Seated   Long Arc Quad 10 reps   Knee/Hip Exercises: Supine   Quad Sets 10 reps   Short Arc Quad Sets Right;10 reps   Heel Slides AROM;10 reps   Straight Leg Raises 10 reps   Knee/Hip Exercises: Sidelying   Hip ABduction 10 reps                PT Education - 03/02/15 1218    Education provided  Yes   Education Details HEP   Person(s) Educated Patient   Methods Explanation;Handout;Verbal cues;Tactile cues   Comprehension Verbalized understanding;Returned demonstration          PT Short Term Goals - 03/02/15 1232    PT SHORT TERM GOAL #1   Title Patient to demonstrate R knee ROM 0 degrees extension and 120 degrees flexion in order to reduce pain and improve general mobility    Time 2   Period Weeks   Status On-going   PT SHORT TERM GOAL #2   Title Patient will be independent in maintaining safety awareness and good form when performing stand/sit and sit/stand transfers and with mobility in general in order to prevent fall and enhance safety    Time 2   Period Weeks   Status On-going   PT SHORT TERM GOAL #3   Title Patient will demonstrate good mechanics with gait, including equal step/stance times, upright posture, full knee TKE, and good heel-toe sequencing in order to enhance efficiency of mobility    Time 2   Period Weeks   Status On-going   PT SHORT TERM GOAL #4   Title Patient will be able to ambulate 638f during 3MWT with LRAD in order to demonstrate improved efficiency of gait and readienss to return to community based mobility    Time 2   Period Weeks   Status On-going   PT SHORT TERM GOAL #5   Title Patient will demonstrate indepdnence in correctly and consistently performing appropriate HEP, to be updated PRN    Time 1   Period Weeks   Status Partially Met           PT Long Term Goals - 03/02/15 1233    PT LONG TERM GOAL #1   Title Patient will demonstate strength 4+/5 in all tested muscle groups in order to reduce pain, improve mobilility, and assist in return to PLOF    Time 4   Period Weeks   Status On-going   PT LONG TERM GOAL #2   Title Patient will be able to reciprocally ascend and descend stairs with one railing, step over step pattern, minimal unsteadiness, and good eccentric control in order to improve general mobility and safety in  community    Time 4   Period Weeks   Status On-going   PT LONG TERM GOAL #3   Title Patient to report she has been able to 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  Pt is not returning to MD for two weeks.  Therapist encouraged pt to call MD if drainage continued or area becomes red.  Pt given a copy of her evaluation and reviewed goals.  Therapist added new exercises with facilitaton for proper technique.  Pt will continue to need skilled therapy to address decreased balnce, ROM and strength which is affecting her functional mobiity.    PT Next Visit Plan begin lunging activity as well as gt training with cane.          G-Codes - 2015-03-30 1454    Functional Assessment Tool Used Based on skilled clincal assessment of strength, ROM, gait, posture, balance    Functional Limitation Mobility: Walking and moving around   Mobility: Walking and Moving Around Current Status [phone]) At least 40 percent but less than 60 percent impaired, limited or restricted   Mobility: Walking and Moving Around Goal Status [phone]) At least 20 percent but less than 40 percent impaired, limited or restricted      Problem List Patient Active Problem List   Diagnosis Date Noted  . Iron deficiency 10/17/2013  . 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 Encounter for therapeutic drug monitoring  02/24/2013  . DJD (degenerative joint disease), lumbosacral   . Atrial fibrillation, chronic ([place])   . Chronic anticoagulation   . Tobacco abuse   . Peptic ulcer disease   . Hypertension   . SPINAL STENOSIS 02/03/2008    Rayetta Humphrey, PT CLT [phone] 03/02/2015, 12:34 PM  [facility] [address] [place], [place], [address] Phone: [phone]   Fax:  [phone]  Name: SHEZA STRICKLAND MRN: [id number] Date of Birth: Jun 16, 1940

## 2015-03-02 NOTE — Patient Instructions (Signed)
Knee Extension (Sitting)    Place 0____ pound weight on left ankle and straighten knee fully, lower slowly. Repeat ___10_ times per set. Do __1__ sets per session. Do __2__ sessions per day.  http://orth.exer.us/732   Copyright  VHI. All rights reserved.  Self-Mobilization: Heel Slide (Supine)    Slide left heel toward buttocks until a gentle stretch is felt. Hold __5__ seconds. Relax. Repeat __10__ times per set. Do _1___ sets per session. Do 2____ sessions per day.  http://orth.exer.us/710   Copyright  VHI. All rights reserved.  Strengthening: Terminal Knee Extension (Supine)    With right knee over bolster, straighten knee by tightening muscles on top of thigh. Keep bottom of knee on bolster. Repeat _10___ times per set. Do __1__ sets per session. Do __2__ sessions per day.  http://orth.exer.us/626   Copyright  VHI. All rights reserved.  Strengthening: Straight Leg Raise (Phase 1)    Tighten muscles on front of right thigh, then lift leg _15___ inches from surface, keeping knee locked.  Repeat ___10_ times per set. Do _1___ sets per session. Do _2___ sessions per day.  http://orth.exer.us/614   Copyright  VHI. All rights reserved.  Strengthening: Hip Abduction (Side-Lying)    Tighten muscles on front of right thigh, then lift leg _15___ inches from surface, keeping knee locked.  Repeat _10___ times per set. Do _1___ sets per session. Do ____ 2sessions per day.  http://orth.exer.us/622   Copyright  VHI. All rights reserved.  Bridging    Slowly raise buttocks from floor, keeping stomach tight. Repeat __10__ times per set. Do ___1_ sets per session. Do ___2_ sessions per day.  http://orth.exer.us/1096   Copyright  VHI. All rights reserved.

## 2015-03-05 ENCOUNTER — Ambulatory Visit (INDEPENDENT_AMBULATORY_CARE_PROVIDER_SITE_OTHER): Payer: Medicare Other | Admitting: *Deleted

## 2015-03-05 ENCOUNTER — Ambulatory Visit (HOSPITAL_COMMUNITY): Payer: Medicare Other

## 2015-03-05 DIAGNOSIS — I482 Chronic atrial fibrillation, unspecified: Secondary | ICD-10-CM

## 2015-03-05 DIAGNOSIS — Z5181 Encounter for therapeutic drug level monitoring: Secondary | ICD-10-CM

## 2015-03-05 LAB — POCT INR: INR: 1.3

## 2015-03-07 ENCOUNTER — Encounter (HOSPITAL_COMMUNITY): Payer: Medicare Other

## 2015-03-07 ENCOUNTER — Ambulatory Visit (HOSPITAL_COMMUNITY): Payer: Medicare Other | Admitting: Physical Therapy

## 2015-03-07 ENCOUNTER — Encounter (HOSPITAL_COMMUNITY): Payer: Medicare Other | Attending: Hematology and Oncology | Admitting: Oncology

## 2015-03-07 ENCOUNTER — Encounter: Payer: Self-pay | Admitting: *Deleted

## 2015-03-07 ENCOUNTER — Ambulatory Visit: Payer: Medicare Other | Admitting: Orthopaedic Surgery

## 2015-03-07 VITALS — BP 101/61 | HR 61 | Temp 97.7°F | Resp 18 | Wt 105.7 lb

## 2015-03-07 DIAGNOSIS — F329 Major depressive disorder, single episode, unspecified: Secondary | ICD-10-CM | POA: Diagnosis not present

## 2015-03-07 DIAGNOSIS — M25561 Pain in right knee: Secondary | ICD-10-CM | POA: Diagnosis not present

## 2015-03-07 DIAGNOSIS — I482 Chronic atrial fibrillation: Secondary | ICD-10-CM | POA: Insufficient documentation

## 2015-03-07 DIAGNOSIS — M25661 Stiffness of right knee, not elsewhere classified: Secondary | ICD-10-CM | POA: Diagnosis not present

## 2015-03-07 DIAGNOSIS — C8519 Unspecified B-cell lymphoma, extranodal and solid organ sites: Secondary | ICD-10-CM | POA: Diagnosis not present

## 2015-03-07 DIAGNOSIS — Z90710 Acquired absence of both cervix and uterus: Secondary | ICD-10-CM | POA: Diagnosis not present

## 2015-03-07 DIAGNOSIS — Z7901 Long term (current) use of anticoagulants: Secondary | ICD-10-CM | POA: Diagnosis not present

## 2015-03-07 DIAGNOSIS — M47817 Spondylosis without myelopathy or radiculopathy, lumbosacral region: Secondary | ICD-10-CM | POA: Diagnosis not present

## 2015-03-07 DIAGNOSIS — J449 Chronic obstructive pulmonary disease, unspecified: Secondary | ICD-10-CM | POA: Insufficient documentation

## 2015-03-07 DIAGNOSIS — I10 Essential (primary) hypertension: Secondary | ICD-10-CM | POA: Insufficient documentation

## 2015-03-07 DIAGNOSIS — M6281 Muscle weakness (generalized): Secondary | ICD-10-CM | POA: Diagnosis not present

## 2015-03-07 DIAGNOSIS — C8580 Other specified types of non-Hodgkin lymphoma, unspecified site: Secondary | ICD-10-CM

## 2015-03-07 DIAGNOSIS — C8331 Diffuse large B-cell lymphoma, lymph nodes of head, face, and neck: Secondary | ICD-10-CM

## 2015-03-07 DIAGNOSIS — F17210 Nicotine dependence, cigarettes, uncomplicated: Secondary | ICD-10-CM | POA: Diagnosis not present

## 2015-03-07 DIAGNOSIS — Z9079 Acquired absence of other genital organ(s): Secondary | ICD-10-CM | POA: Insufficient documentation

## 2015-03-07 DIAGNOSIS — Z9089 Acquired absence of other organs: Secondary | ICD-10-CM | POA: Diagnosis not present

## 2015-03-07 DIAGNOSIS — Z980 Intestinal bypass and anastomosis status: Secondary | ICD-10-CM | POA: Insufficient documentation

## 2015-03-07 DIAGNOSIS — R262 Difficulty in walking, not elsewhere classified: Secondary | ICD-10-CM | POA: Diagnosis not present

## 2015-03-07 DIAGNOSIS — Z9889 Other specified postprocedural states: Secondary | ICD-10-CM

## 2015-03-07 DIAGNOSIS — R600 Localized edema: Secondary | ICD-10-CM | POA: Diagnosis not present

## 2015-03-07 LAB — CBC WITH DIFFERENTIAL/PLATELET
BASOS ABS: 0.1 10*3/uL (ref 0.0–0.1)
Basophils Relative: 1 %
Eosinophils Absolute: 0.2 10*3/uL (ref 0.0–0.7)
Eosinophils Relative: 2 %
HEMATOCRIT: 38.7 % (ref 36.0–46.0)
HEMOGLOBIN: 12.9 g/dL (ref 12.0–15.0)
LYMPHS PCT: 31 %
Lymphs Abs: 3.1 10*3/uL (ref 0.7–4.0)
MCH: 31.6 pg (ref 26.0–34.0)
MCHC: 33.3 g/dL (ref 30.0–36.0)
MCV: 94.9 fL (ref 78.0–100.0)
MONO ABS: 0.8 10*3/uL (ref 0.1–1.0)
Monocytes Relative: 8 %
NEUTROS ABS: 6 10*3/uL (ref 1.7–7.7)
Neutrophils Relative %: 58 %
Platelets: 322 10*3/uL (ref 150–400)
RBC: 4.08 MIL/uL (ref 3.87–5.11)
RDW: 14.3 % (ref 11.5–15.5)
WBC: 10.2 10*3/uL (ref 4.0–10.5)

## 2015-03-07 LAB — COMPREHENSIVE METABOLIC PANEL
ALK PHOS: 61 U/L (ref 38–126)
ALT: 20 U/L (ref 14–54)
AST: 25 U/L (ref 15–41)
Albumin: 3.9 g/dL (ref 3.5–5.0)
Anion gap: 8 (ref 5–15)
BILIRUBIN TOTAL: 0.3 mg/dL (ref 0.3–1.2)
BUN: 11 mg/dL (ref 6–20)
CO2: 28 mmol/L (ref 22–32)
CREATININE: 0.63 mg/dL (ref 0.44–1.00)
Calcium: 8.8 mg/dL — ABNORMAL LOW (ref 8.9–10.3)
Chloride: 103 mmol/L (ref 101–111)
GFR calc Af Amer: >60 mL/min (ref >60)
Glucose, Bld: 165 mg/dL — ABNORMAL HIGH (ref 65–99)
Potassium: 4.2 mmol/L (ref 3.5–5.1)
Sodium: 139 mmol/L (ref 135–145)
TOTAL PROTEIN: 6.9 g/dL (ref 6.5–8.1)

## 2015-03-07 LAB — LACTATE DEHYDROGENASE: LDH: 155 U/L (ref 98–192)
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
## 2015-03-07 NOTE — Assessment & Plan Note (Signed)
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 S/P 2 cycles of BR (10/5- 11/29/2013) with progression of disease documented on 12/19/2013 PET scan requiring a change in therapy to R-CHOP x 3 cycles (01/02/2014- 02/13/2014) with complete response on PET on 02/06/2014 followed by radiation therapy, finishing all therapy on 04/12/2014.  Oncology history is up-to-date.  Labs today as ordered: CBC diff, CMET, LDH, B2M.  Labs in 3 months: CBC diff, CMET, LDH, B2M, CRP, ESR.  I personally reviewed and went over radiographic studies with the patient.  The results are noted within this dictation.  Next mammogram is due in Sept 2017.  Return in 3 months for follow-up.

## 2015-03-07 NOTE — Therapy (Signed)
[facility] [place], [place], [address] Phone: [phone]   Fax:  [phone]  Physical Therapy Treatment  Patient Details  Name: Marissa Turner MRN: [id number] Date of Birth: Jul 18, 1940 Referring Provider: Sanjuana Kava MD   Encounter Date: 03/07/2015      PT End of Session - 03/07/15 1229    Visit Number 3   Number of Visits 12   Date for PT Re-Evaluation 03/29/15   Authorization Time Period 03/01/15 to 04/29/15   Authorization - Visit Number 3   Authorization - Number of Visits 10   PT Start Time 8101   PT Stop Time 7510 Pt was placed on nustep at 1228 instructed and ensured safety until 2585 treatment from 1230-1238 was not charged for as therapist was remote.    PT Time Calculation (min) 48 min   Activity Tolerance Patient tolerated treatment well      Past Medical History  Diagnosis Date  . Atrial fibrillation, chronic ([place]) 2004    left atrial thrombus in 2004; near syncope in 2008; echocardiogram in 2008-normal EF, mild left atrial enlargement, no valvular abnormalities, lipomatous hypertrophy; no aortic atheroma on TEE in 2004  . Chronic anticoagulation 2004    warfarin  . Tobacco abuse     40 pack years; quit attempt in 2012  . DJD (degenerative joint disease), lumbosacral     Also hip  . Peptic ulcer disease     s/p Billroth II  . Depression   . Fracture of wrist     Left  . Hypertension     Normal carotid ultrasound in 2008  . PONV (postoperative nausea and vomiting)   . Dysrhythmia     chronic AFib  . Cancer ([place])     LYMPH NODE    Past Surgical History  Procedure Laterality Date  . Cholecystectomy  2004    [place] II; splenectomy; incidental appendectomy  . Abdominal hysterectomy      w/o oophorectomy  . Cesarean section    . Tonsillectomy    . Colonoscopy  1980s    patient denies ever having undergone colonoscopy as of 2014  . Appendectomy     . Splenectomy, total    . Lymph node biopsy Left 09/15/2013    Procedure: LEFT NECK CERVICAL NODE BIOPSY;  Surgeon: Scherry Ran, MD;  Location: [facility];  Service: General;  Laterality: Left;  . Portacath placement Right 10/21/2013    Procedure: ATTEMPTED INSERTION PORT-A-CATH;  Surgeon: Scherry Ran, MD;  Location: [facility];  Service: General;  Laterality: Right;  . Port-a-cath removal Right 07/12/2014    Procedure: REMOVAL PORT-A-CATH;  Surgeon: Aviva Signs Md, MD;  Location: [facility];  Service: General;  Laterality: Right;  . Knee arthroscopy with medial menisectomy Right 02/26/2015    Procedure: KNEE ARTHROSCOPY WITH MEDIAL MENISECTOMY;  Surgeon: Sanjuana Kava, MD;  Location: [facility];  Service: Orthopedics;  Laterality: Right;    There were no vitals filed for this visit.  Visit Diagnosis:  S/P right knee arthroscopy  Knee pain, right  Stiffness of right knee  Difficulty walking  Local edema  Muscle weakness      Subjective Assessment - 03/07/15 1146    Subjective PT states her opposite knee is hurting her now. Pt comes into department using no assistive device.    Currently in Pain? Yes   Pain Score 2    Pain Location Knee  Pain Orientation Right;Left   Pain Type Surgical pain;Chronic pain   Pain Onset 1 to 4 weeks ago   Pain Frequency Intermittent   Aggravating Factors  walking    Pain Relieving Factors sitting                          [facility] Adult PT Treatment/Exercise - 03/07/15 0001    Exercises   Exercises Knee/Hip   Knee/Hip Exercises: Stretches   Active Hamstring Stretch Right;2 reps;30 seconds   Passive Hamstring Stretch Right;2 reps;30 seconds   Knee: Self-Stretch to increase Flexion Right;5 reps   Knee/Hip Exercises: Aerobic   Nustep hills 3; level 4 x 10:00   Knee/Hip Exercises: Standing   Heel Raises Both;15 reps   Heel Raises Limitations toe raises   Knee Flexion Strengthening;Right;10 reps   Knee Flexion Limitations 3#    Terminal Knee Extension 10 reps   Rocker Board 2 minutes   SLS --   Other Standing Knee Exercises tandem stance x 30" x 3    Knee/Hip Exercises: Seated   Long Arc Quad 10 reps   Long Arc Quad Weight 3 lbs.   Sit to [employer] 10 reps   Knee/Hip Exercises: Supine   Quad Sets 10 reps   Short Arc [employer] Right;15 reps   Short Arc Quad Sets Limitations 3#   Heel Slides --   Heel Slides Limitations AROM 0-135    Straight Leg Raises Both;10 reps   Knee/Hip Exercises: Sidelying   Hip ABduction Both;10 reps   Hip ABduction Limitations 3#                   PT Short Term Goals - 03/02/15 1232    PT SHORT TERM GOAL #1   Title Patient to demonstrate Rt knee ROM 0 degrees extension and 120 degrees flexion in order to reduce pain and improve general mobility    Time 2   Period Weeks   Status On-going   PT SHORT TERM GOAL #2   Title Patient will be independent in maintaining safety awareness and good form when performing stand/sit and sit/stand transfers and with mobility in general in order to prevent fall and enhance safety    Time 2   Period Weeks   Status On-going   PT SHORT TERM GOAL #3   Title Patient will demonstrate good mechanics with gait, including equal step/stance times, upright posture, full knee TKE, and good heel-toe sequencing in order to enhance efficiency of mobility    Time 2   Period Weeks   Status On-going   PT SHORT TERM GOAL #4   Title Patient will be able to ambulate 632f during 3MWT with LRAD in order to demonstrate improved efficiency of gait and readienss to return to community based mobility    Time 2   Period Weeks   Status On-going   PT SHORT TERM GOAL #5   Title Patient will demonstrate indepdnence in correctly and consistently performing appropriate HEP, to be updated PRN    Time 1   Period Weeks   Status Partially Met           PT Long Term Goals - 03/02/15 1233    PT LONG TERM GOAL #1   Title Patient will demonstate strength 4+/5 in  all tested muscle groups in order to reduce pain, improve mobilility, and assist in return to PLOF    Time 4   Period Weeks   Status  On-going   PT LONG TERM GOAL #2   Title Patient will be able to reciprocally ascend and descend stairs with one railing, step over step pattern, minimal unsteadiness, and good eccentric control in order to improve general mobility and safety in community    Time 4   Period Weeks   Status On-going   PT LONG TERM GOAL #3   Title Patient to report she has been able to 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  All single leg activites were limited to prevent aggrevation of OA. PT needs therapist facilitation to complete exercises in proper form.   Pt stating that drainage and redness along incision is decreasing. Pt will benefit from exercises on both LE to improve functional abitlity.    Pt will benefit from skilled therapeutic intervention in order to improve on the following deficits Abnormal gait;Hypomobility;Increased edema;Decreased activity tolerance;Decreased strength;Pain;Decreased mobility;Difficulty walking;Improper body mechanics;Impaired flexibility;Postural dysfunction   PT Next Visit Plan therapist held lunging due to increased pain in Lt LE; add lunging activity next treatment.         Problem List Patient Active Problem List   Diagnosis Date Noted  . Iron deficiency 10/17/2013   . Lymphoma malignant, large cell ([place]) 10/10/2013  . Encounter for therapeutic drug monitoring 02/24/2013  . DJD (degenerative joint disease), lumbosacral   . Atrial fibrillation, chronic ([place])   . Chronic anticoagulation   . Tobacco abuse   . Peptic ulcer disease   . Hypertension   . SPINAL STENOSIS 02/03/2008   Rayetta Humphrey, PT CLT [phone] 03/07/2015, 12:30 PM  [facility] [address] [place], [place], [address] Phone: [phone]   Fax:  [phone]  Name: Marissa Turner MRN: [id number] Date of Birth: 1941-01-22

## 2015-03-07 NOTE — Patient Instructions (Signed)
[place] at [facility]  Discharge Instructions:  You are doing great from a cancer standpoint.  Not all of your labs were back when you were seen in the clinic, so we will call you and update you with results as they become available.  Keep up the good work with physical therapy for your right knee.  We will do labs in 3 months and have you come in for a follow-up appointment in 3 months.  We are getting closer to spacing out your appointments.  Keep up the good work!  Have a wonderful Spring 2017!  Just a reminder: You will be due for your next mammogram in September 2017. _______________________________________________________________  Thank you for choosing [place] at [facility] to provide your oncology and hematology care.  To afford each patient quality time with our providers, please arrive at least 15 minutes before your scheduled appointment.  You need to re-schedule your appointment if you arrive 10 or more minutes late.  We strive to give you quality time with our providers, and arriving late affects you and other patients whose appointments are after yours.  Also, if you no show three or more times for appointments you may be dismissed from the clinic.  Again, thank you for choosing [place] at [facility] hope is that these requests will allow you access to exceptional care and in a timely manner. _______________________________________________________________  If you have questions after your visit, please contact our office at [phone] between the hours of 8:30 a.m. and 5:00 p.m. Voicemails left after 4:30 p.m. will not be returned until the following business day. _______________________________________________________________  For prescription refill requests, have your pharmacy contact our office. _______________________________________________________________  Recommendations made by the  consultant and any test results will be sent to your referring physician. _______________________________________________________________

## 2015-03-08 ENCOUNTER — Ambulatory Visit (INDEPENDENT_AMBULATORY_CARE_PROVIDER_SITE_OTHER): Payer: Self-pay | Admitting: Orthopaedic Surgery

## 2015-03-08 ENCOUNTER — Encounter: Payer: Self-pay | Admitting: Orthopaedic Surgery

## 2015-03-08 ENCOUNTER — Ambulatory Visit (HOSPITAL_COMMUNITY): Payer: Medicare Other | Admitting: Physical Therapy

## 2015-03-08 VITALS — BP 120/76 | HR 57 | Temp 98.1°F | Ht <= 58 in | Wt 104.6 lb

## 2015-03-08 DIAGNOSIS — S83241D Other tear of medial meniscus, current injury, right knee, subsequent encounter: Secondary | ICD-10-CM

## 2015-03-08 LAB — BETA 2 MICROGLOBULIN, SERUM: BETA 2 MICROGLOBULIN: 1.7 mg/L (ref 0.6–2.4)

## 2015-03-08 NOTE — Patient Instructions (Signed)
Continue PT.  Continue exercises at home.

## 2015-03-08 NOTE — Progress Notes (Signed)
Cc:  My knee is better  She is doing well post op right knee arthroscopy two weeks ago.  She has been to PT.  She has some swelling of the knee and some popping but overall doing well.  She  Is not using a cane or walker.  She has no redness.  ROM is 0 to 110.  Her sutures were removed.  She will continue PT.  Impression:  Doing well post right knee arthroscopy.  Continue PT.  Return in two weeks.

## 2015-03-12 ENCOUNTER — Ambulatory Visit (INDEPENDENT_AMBULATORY_CARE_PROVIDER_SITE_OTHER): Payer: Medicare Other | Admitting: *Deleted

## 2015-03-12 ENCOUNTER — Telehealth (HOSPITAL_COMMUNITY): Payer: Self-pay

## 2015-03-12 ENCOUNTER — Ambulatory Visit (HOSPITAL_COMMUNITY): Payer: Medicare Other

## 2015-03-12 DIAGNOSIS — I482 Chronic atrial fibrillation, unspecified: Secondary | ICD-10-CM

## 2015-03-12 DIAGNOSIS — Z5181 Encounter for therapeutic drug level monitoring: Secondary | ICD-10-CM

## 2015-03-12 LAB — POCT INR: INR: 2.2

## 2015-03-12 NOTE — Telephone Encounter (Signed)
Therapist called and spoke to the pt this date to inform of no show to today's scheduled PT visit at 11:45 am. Pt noted that both of her knees were hurting today and did not want to participate with PT due to her increased pain levels. Therapist encouraged pt to come to her next PT visit in order to further assess the pt's complaints and make adjustments with the pt's PT tx. Pt noted that she would see how she felt on Wednesday and will plan to call the clinic if she plans to cancel her appt.

## 2015-03-14 ENCOUNTER — Telehealth (HOSPITAL_COMMUNITY): Payer: Self-pay

## 2015-03-14 ENCOUNTER — Ambulatory Visit (HOSPITAL_COMMUNITY): Payer: Medicare Other

## 2015-03-14 NOTE — Telephone Encounter (Signed)
No show, called mobile and home phones and left messages indicating missed apt, explained 2nd no show policy and all apt.s cancelled except for next apt 02/17, given date and time with contact info given.  [address] [place]; [facility] [phone]

## 2015-03-16 ENCOUNTER — Ambulatory Visit (HOSPITAL_COMMUNITY): Payer: Medicare Other

## 2015-03-19 ENCOUNTER — Encounter (HOSPITAL_COMMUNITY): Payer: Medicare Other

## 2015-03-20 ENCOUNTER — Other Ambulatory Visit (HOSPITAL_COMMUNITY): Payer: Self-pay | Admitting: Oncology

## 2015-03-21 ENCOUNTER — Encounter (HOSPITAL_COMMUNITY): Payer: Medicare Other | Admitting: Physical Therapy

## 2015-03-22 ENCOUNTER — Encounter: Payer: Self-pay | Admitting: Orthopaedic Surgery

## 2015-03-22 ENCOUNTER — Ambulatory Visit (INDEPENDENT_AMBULATORY_CARE_PROVIDER_SITE_OTHER): Payer: Medicare Other | Admitting: Orthopaedic Surgery

## 2015-03-22 VITALS — BP 119/66 | HR 74 | Temp 98.4°F | Resp 16 | Ht <= 58 in | Wt 104.0 lb

## 2015-03-22 DIAGNOSIS — M79671 Pain in right foot: Secondary | ICD-10-CM | POA: Insufficient documentation

## 2015-03-22 DIAGNOSIS — M25561 Pain in right knee: Secondary | ICD-10-CM | POA: Diagnosis not present

## 2015-03-22 MED ORDER — HYDROCODONE-ACETAMINOPHEN 7.5-325 MG PO TABS: 1.0000 | ORAL_TABLET | ORAL | Status: DC | PRN

## 2015-03-22 NOTE — Patient Instructions (Signed)
Smoking Cessation, Tips for Success If you are ready to quit smoking, congratulations! You have chosen to help yourself be healthier. Cigarettes bring nicotine, tar, carbon monoxide, and other irritants into your body. Your lungs, heart, and blood vessels will be able to work better without these poisons. There are many different ways to quit smoking. Nicotine gum, nicotine patches, a nicotine inhaler, or nicotine nasal spray can help with physical craving. Hypnosis, support groups, and medicines help break the habit of smoking. WHAT THINGS CAN I DO TO MAKE QUITTING EASIER?  Here are some tips to help you quit for good:  Pick a date when you will quit smoking completely. Tell all of your friends and family about your plan to quit on that date.  Do not try to slowly cut down on the number of cigarettes you are smoking. Pick a quit date and quit smoking completely starting on that day.  Throw away all cigarettes.   Clean and remove all ashtrays from your home, work, and car.  On a card, write down your reasons for quitting. Carry the card with you and read it when you get the urge to smoke.  Cleanse your body of nicotine. Drink enough water and fluids to keep your urine clear or pale yellow. Do this after quitting to flush the nicotine from your body.  Learn to predict your moods. Do not let a bad situation be your excuse to have a cigarette. Some situations in your life might tempt you into wanting a cigarette.  Never have "just one" cigarette. It leads to wanting another and another. Remind yourself of your decision to quit.  Change habits associated with smoking. If you smoked while driving or when feeling stressed, try other activities to replace smoking. Stand up when drinking your coffee. Brush your teeth after eating. Sit in a different chair when you read the paper. Avoid alcohol while trying to quit, and try to drink fewer caffeinated beverages. Alcohol and caffeine may urge you to  smoke.  Avoid foods and drinks that can trigger a desire to smoke, such as sugary or spicy foods and alcohol.  Ask people who smoke not to smoke around you.  Have something planned to do right after eating or having a cup of coffee. For example, plan to take a walk or exercise.  Try a relaxation exercise to calm you down and decrease your stress. Remember, you may be tense and nervous for the first 2 weeks after you quit, but this will pass.  Find new activities to keep your hands busy. Play with a pen, coin, or rubber band. Doodle or draw things on paper.  Brush your teeth right after eating. This will help cut down on the craving for the taste of tobacco after meals. You can also try mouthwash.   Use oral substitutes in place of cigarettes. Try using lemon drops, carrots, cinnamon sticks, or chewing gum. Keep them handy so they are available when you have the urge to smoke.  When you have the urge to smoke, try deep breathing.  Designate your home as a nonsmoking area.  If you are a heavy smoker, ask your health care provider about a prescription for nicotine chewing gum. It can ease your withdrawal from nicotine.  Reward yourself. Set aside the cigarette money you save and buy yourself something nice.  Look for support from others. Join a support group or smoking cessation program. Ask someone at home or at work to help you with your plan   to quit smoking.  Always ask yourself, "Do I need this cigarette or is this just a reflex?" Tell yourself, "Today, I choose not to smoke," or "I do not want to smoke." You are reminding yourself of your decision to quit.  Do not replace cigarette smoking with electronic cigarettes (commonly called e-cigarettes). The safety of e-cigarettes is unknown, and some may contain harmful chemicals.  If you relapse, do not give up! Plan ahead and think about what you will do the next time you get the urge to smoke. HOW WILL I FEEL WHEN I QUIT SMOKING? You  may have symptoms of withdrawal because your body is used to nicotine (the addictive substance in cigarettes). You may crave cigarettes, be irritable, feel very hungry, cough often, get headaches, or have difficulty concentrating. The withdrawal symptoms are only temporary. They are strongest when you first quit but will go away within 10-14 days. When withdrawal symptoms occur, stay in control. Think about your reasons for quitting. Remind yourself that these are signs that your body is healing and getting used to being without cigarettes. Remember that withdrawal symptoms are easier to treat than the major diseases that smoking can cause.  Even after the withdrawal is over, expect periodic urges to smoke. However, these cravings are generally short lived and will go away whether you smoke or not. Do not smoke! WHAT RESOURCES ARE AVAILABLE TO HELP ME QUIT SMOKING? Your health care provider can direct you to community resources or hospitals for support, which may include:  Group support.  Education.  Hypnosis.  Therapy.   This information is not intended to replace advice given to you by your health care provider. Make sure you discuss any questions you have with your health care provider.   Document Released: 10/12/2003 Document Revised: 02/03/2014 Document Reviewed: 07/01/2012 Elsevier Interactive Patient Education 2016 [employer] Inc[employer]  

## 2015-03-22 NOTE — Progress Notes (Signed)
CC:  My right knee is tender  She is here for follow-up of the right knee post arthroscopy.  She has decided not go to go physical therapy anymore.  She has has more swelling this last week after falling on the knee.  ROM is 0 to 110.  She has a 1+ effusion.  The patient request injection, verbal consent was obtained.  The right knee was prepped appropriately after time out was performed.   Sterile technique was observed and anesthesia was provided by ethyl chloride and a 20-gauge needle was used to inject the knee area.  A 16-gauge needle was then used to aspirate the knee.  Color of fluid aspirated was blood tinged  Total cc's aspirated was 15.    Injection of 1 cc of Depo-Medrol 40 mg with several cc's of plain xylocaine was then performed.  A band aid dressing was applied.  The patient was advised to apply ice later today and tomorrow to the injection sight as needed.  She is to continue her exercises.  I will see her in about three weeks.  Call if any problem.

## 2015-03-23 ENCOUNTER — Encounter (HOSPITAL_COMMUNITY): Payer: Medicare Other | Admitting: Physical Therapy

## 2015-03-26 ENCOUNTER — Encounter (HOSPITAL_COMMUNITY): Payer: Medicare Other | Admitting: Physical Therapy

## 2015-03-28 ENCOUNTER — Encounter (HOSPITAL_COMMUNITY): Payer: Medicare Other

## 2015-03-30 ENCOUNTER — Encounter (HOSPITAL_COMMUNITY): Payer: Medicare Other | Admitting: Physical Therapy

## 2015-04-02 ENCOUNTER — Ambulatory Visit (INDEPENDENT_AMBULATORY_CARE_PROVIDER_SITE_OTHER): Payer: Medicare Other | Admitting: *Deleted

## 2015-04-02 ENCOUNTER — Encounter (HOSPITAL_COMMUNITY): Payer: Medicare Other | Admitting: Physical Therapy

## 2015-04-02 DIAGNOSIS — Z5181 Encounter for therapeutic drug level monitoring: Secondary | ICD-10-CM

## 2015-04-02 DIAGNOSIS — I482 Chronic atrial fibrillation, unspecified: Secondary | ICD-10-CM

## 2015-04-02 LAB — POCT INR: INR: 2.2

## 2015-04-04 ENCOUNTER — Ambulatory Visit (INDEPENDENT_AMBULATORY_CARE_PROVIDER_SITE_OTHER): Payer: Self-pay | Admitting: Orthopaedic Surgery

## 2015-04-04 ENCOUNTER — Encounter (HOSPITAL_COMMUNITY): Payer: Medicare Other | Admitting: Physical Therapy

## 2015-04-04 VITALS — BP 130/83 | HR 64 | Temp 98.1°F | Ht <= 58 in | Wt 106.8 lb

## 2015-04-04 DIAGNOSIS — S83241D Other tear of medial meniscus, current injury, right knee, subsequent encounter: Secondary | ICD-10-CM

## 2015-04-04 NOTE — Progress Notes (Signed)
CC:  My right knee is still sore some  She has some pain of the right knee.  She has some swelling. But she is walking better and has less pain overall.  Wound looks good. She has an effusion 1+.  ROM 0 to 110. Gait is good.  She is doing well post arthroscopy of the right knee.  I will see her back in three weeks  Continue present medicine and exercises.

## 2015-04-05 ENCOUNTER — Ambulatory Visit: Payer: Medicare Other | Admitting: Orthopaedic Surgery

## 2015-04-06 ENCOUNTER — Encounter (HOSPITAL_COMMUNITY): Payer: Medicare Other

## 2015-04-25 ENCOUNTER — Encounter: Payer: Self-pay | Admitting: Orthopaedic Surgery

## 2015-04-25 ENCOUNTER — Ambulatory Visit: Payer: Medicare Other | Admitting: Orthopaedic Surgery

## 2015-04-30 ENCOUNTER — Ambulatory Visit (INDEPENDENT_AMBULATORY_CARE_PROVIDER_SITE_OTHER): Payer: Medicare Other | Admitting: *Deleted

## 2015-04-30 DIAGNOSIS — I482 Chronic atrial fibrillation, unspecified: Secondary | ICD-10-CM

## 2015-04-30 DIAGNOSIS — Z5181 Encounter for therapeutic drug level monitoring: Secondary | ICD-10-CM | POA: Diagnosis not present

## 2015-04-30 LAB — POCT INR: INR: 1.9

## 2015-05-02 ENCOUNTER — Ambulatory Visit (INDEPENDENT_AMBULATORY_CARE_PROVIDER_SITE_OTHER): Payer: Medicare Other | Admitting: Orthopaedic Surgery

## 2015-05-02 VITALS — BP 120/67 | HR 53 | Temp 98.1°F | Ht <= 58 in | Wt 106.8 lb

## 2015-05-02 DIAGNOSIS — M25561 Pain in right knee: Secondary | ICD-10-CM | POA: Diagnosis not present

## 2015-05-02 MED ORDER — HYDROCODONE-ACETAMINOPHEN 7.5-325 MG PO TABS: 1.0000 | ORAL_TABLET | ORAL | Status: DC | PRN

## 2015-05-02 NOTE — Progress Notes (Signed)
CC:  I have pain of my right knee. I would like an injection.  The patient has chronic pain of the right knee.  There is no recent trauma.  There is no redness.  Injections in the past have helped.  The knee has no redness, has an effusion and crepitus present.  ROM of the knee is 0-105.  Impression:  Chronic knee pain right.  Post arthroscopy of the right knee.  Return: three weeks.  PROCEDURE NOTE:  The patient request injection, verbal consent was obtained.  The right knee was prepped appropriately after time out was performed.   Sterile technique was observed and injection of 1 cc of Depo-Medrol 40 mg with several cc's of plain xylocaine. Anesthesia was provided by ethyl chloride and a 20-gauge needle was used to inject the knee area. The injection was tolerated well.  A band aid dressing was applied.  The patient was advised to apply ice later today and tomorrow to the injection sight as needed.

## 2015-05-21 ENCOUNTER — Ambulatory Visit (INDEPENDENT_AMBULATORY_CARE_PROVIDER_SITE_OTHER): Payer: Medicare Other | Admitting: *Deleted

## 2015-05-21 DIAGNOSIS — I482 Chronic atrial fibrillation, unspecified: Secondary | ICD-10-CM

## 2015-05-21 DIAGNOSIS — Z5181 Encounter for therapeutic drug level monitoring: Secondary | ICD-10-CM | POA: Diagnosis not present

## 2015-05-21 LAB — POCT INR: INR: 3

## 2015-05-23 ENCOUNTER — Encounter: Payer: Self-pay | Admitting: Orthopaedic Surgery

## 2015-05-23 ENCOUNTER — Ambulatory Visit (INDEPENDENT_AMBULATORY_CARE_PROVIDER_SITE_OTHER): Payer: Medicare Other | Admitting: Orthopaedic Surgery

## 2015-05-23 ENCOUNTER — Ambulatory Visit (INDEPENDENT_AMBULATORY_CARE_PROVIDER_SITE_OTHER): Payer: Medicare Other

## 2015-05-23 VITALS — BP 130/78 | HR 63 | Temp 98.1°F | Resp 16 | Ht <= 58 in | Wt 102.0 lb

## 2015-05-23 DIAGNOSIS — M5442 Lumbago with sciatica, left side: Secondary | ICD-10-CM

## 2015-05-23 DIAGNOSIS — I482 Chronic atrial fibrillation, unspecified: Secondary | ICD-10-CM

## 2015-05-23 DIAGNOSIS — M25561 Pain in right knee: Secondary | ICD-10-CM

## 2015-05-23 DIAGNOSIS — I10 Essential (primary) hypertension: Secondary | ICD-10-CM

## 2015-05-23 DIAGNOSIS — C8580 Other specified types of non-Hodgkin lymphoma, unspecified site: Secondary | ICD-10-CM

## 2015-05-23 MED ORDER — PREDNISONE 10 MG (21) PO TBPK
ORAL_TABLET | ORAL | Status: DC
Start: 2015-05-23 — End: 2015-06-04

## 2015-05-23 NOTE — Progress Notes (Signed)
Patient Marissa Turner, female DOB:1940-03-11, 75 y.o. ENI:[id number]  Chief Complaint  Patient presents with  . Follow-up    right knee pain "same"    HPI  Marissa Turner is a 75 y.o. female who has right knee pain post arthroscopy in late January.  Her right knee is a little better and has less swelling today.  She fell late last week and landed on the right knee.  She has resolving abrasions.  She had no other injury.  She has a new problem:  Pain running down from her back to the left hip, left knee and left foot.  She said it began about a week ago, before her fall.  It is getting worse.  It hurts with any bending or coughing. She has no weakness, no bowel or bladder problem.  She has taken the pain medicine I have given her.  She cannot take NSAIDs as she is on Coumadin for her atrial fib.  She has tried ice and heat with little help.  HPI  Body mass index is 21.32 kg/(m^2). Marland Turner  Review of Systems  HENT: Negative for congestion.   Respiratory: Negative for cough and shortness of breath.   Cardiovascular: Positive for palpitations. Negative for chest pain and leg swelling.  Endocrine: Positive for cold intolerance.  Musculoskeletal: Positive for back pain, joint swelling, arthralgias and gait problem.  Allergic/Immunologic: Positive for environmental allergies.  Psychiatric/Behavioral: The patient is nervous/anxious.     Past Medical History  Diagnosis Date  . Atrial fibrillation, chronic ([place]) 2004    left atrial thrombus in 2004; near syncope in 2008; echocardiogram in 2008-normal EF, mild left atrial enlargement, no valvular abnormalities, lipomatous hypertrophy; no aortic atheroma on TEE in 2004  . Chronic anticoagulation 2004    warfarin  . Tobacco abuse     40 pack years; quit attempt in 2012  . DJD (degenerative joint disease), lumbosacral     Also hip  . Peptic ulcer disease     s/p Billroth II  . Depression   . Fracture of wrist     Left  . Hypertension    Normal carotid ultrasound in 2008  . PONV (postoperative nausea and vomiting)   . Dysrhythmia     chronic AFib  . Cancer ([place])     LYMPH NODE    Past Surgical History  Procedure Laterality Date  . Cholecystectomy  2004    [place] II; splenectomy; incidental appendectomy  . Abdominal hysterectomy      w/o oophorectomy  . Cesarean section    . Tonsillectomy    . Colonoscopy  1980s    patient denies ever having undergone colonoscopy as of 2014  . Appendectomy    . Splenectomy, total    . Lymph node biopsy Left 09/15/2013    Procedure: LEFT NECK CERVICAL NODE BIOPSY;  Surgeon: Scherry Ran, MD;  Location: [facility];  Service: General;  Laterality: Left;  . Portacath placement Right 10/21/2013    Procedure: ATTEMPTED INSERTION PORT-A-CATH;  Surgeon: Scherry Ran, MD;  Location: [facility];  Service: General;  Laterality: Right;  . Port-a-cath removal Right 07/12/2014    Procedure: REMOVAL PORT-A-CATH;  Surgeon: Aviva Signs Md, MD;  Location: [facility];  Service: General;  Laterality: Right;  . Knee arthroscopy with medial menisectomy Right 02/26/2015    Procedure: KNEE ARTHROSCOPY WITH MEDIAL MENISECTOMY;  Surgeon: Sanjuana Kava, MD;  Location: [facility];  Service: Orthopedics;  Laterality: Right;    Family History  Problem Relation Age of Onset  . Heart attack Mother     also brother  . Breast cancer Sister   . Heart attack Brother   . Breast cancer Sister     x2  . Cirrhosis Sister   . Cancer Brother     Gastric carcinoma; also father    Social History Social History  Substance Use Topics  . Smoking status: Current Every Day Smoker -- 0.25 packs/day for 55 years    Types: Cigarettes  . Smokeless tobacco: None     Comment: Quit attempt in 2012  . Alcohol Use: No    Allergies  Allergen Reactions  . Diphenhydramine Hcl Other (See Comments)    hallucinations   . Estrogens Other (See Comments)    edema  . Penicillins  Other (See Comments)    Break out in whelps.  . Codeine Swelling and Rash    Current Outpatient Prescriptions  Medication Sig Dispense Refill  . acetaminophen (TYLENOL) 500 MG tablet Take 1,000 mg by mouth daily as needed for headache.    . ALPRAZolam (XANAX) 1 MG tablet TAKE ONE (1) TABLET THREE (3) TIMES EACHDAY ASN NEEDED FOR ANXIETY 90 tablet 2  . citalopram (CELEXA) 20 MG tablet TAKE ONE (1) TABLET EACH DAY 30 tablet 3  . digoxin (LANOXIN) 0.125 MG tablet Take 125 mcg by mouth daily.     Marland Turner diltiazem (DILACOR XR) 120 MG 24 hr capsule Take 120 mg by mouth daily.    . Diphenhyd-Hydrocort-Nystatin (FIRST-DUKES MOUTHWASH) SUSP Use as directed 15 mLs in the mouth or throat 4 (four) times daily as needed. 300 mL 0  . HYDROcodone-acetaminophen (NORCO) 7.5-325 MG tablet Take 1 tablet by mouth every 4 (four) hours as needed for moderate pain (Must last 30 days.  Do not drive or operate machinery while taking this medicine.). 120 tablet 0  . metoprolol (TOPROL-XL) 25 MG 24 hr tablet Take 12.5 mg by mouth 2 (two) times daily.     . Prenatal Vit-Fe Fumarate-FA (PRENATAL PO) Take 1 tablet by mouth daily.    Marland Turner warfarin (COUMADIN) 5 MG tablet Take 1 tablet daily except 1/2 tablet on Mondays and Fridays 30 tablet 6  . predniSONE (STERAPRED UNI-PAK 21 TAB) 10 MG (21) TBPK tablet Take six pills the first day; then 5 pills the next day; then 4 pills the next day; then 3 pills the next day; then 2 pills the next day and the last day take one pill by mouth. 21 tablet 1   No current facility-administered medications for this visit.     Physical Exam  Blood pressure 130/78, pulse 63, temperature 98.1 F (36.7 C), resp. rate 16, height '4\' 10"'$  (1.473 m), weight 102 lb (46.267 kg).  Constitutional: overall normal hygiene, normal nutrition, well developed, normal grooming, normal body habitus. Assistive device:none  Musculoskeletal: gait and station Limp right, muscle tone and strength are normal, no tremors  or atrophy is present.  .  Neurological: coordination overall normal.  Deep tendon reflex/nerve stretch intact.  Sensation normal.  Cranial nerves II-XII intact.   Skin:   Normal with arthroscopy scars of the right knee healed, overall no scars, lesions, ulcers or rashes. No psoriasis.  Psychiatric: Alert and oriented x 3.  Recent memory intact, remote memory unclear.  Normal mood and affect. Well groomed.  Good eye contact.  Cardiovascular: overall no swelling, no varicosities, no edema bilaterally, normal temperatures of the legs and arms, no  clubbing, cyanosis and good capillary refill.  Lymphatic: palpation is normal. Spine/Pelvis examination:  Inspection:  Overall, sacoiliac joint benign and hips nontender; without crepitus or defects.   Thoracic spine inspection: Alignment normal without kyphosis present   Lumbar spine inspection:  Alignment  with normal lumbar lordosis, without scoliosis apparent.   Thoracic spine palpation:  without tenderness of spinal processes   Lumbar spine palpation: with tenderness of lumbar area; without tightness of lumbar muscles    Range of Motion:   Lumbar flexion, forward flexion is 35  with pain or tenderness    Lumbar extension is 5  with pain or tenderness   Left lateral bend is Normal  without pain or tenderness   Right lateral bend is Normal without pain or tenderness   Straight leg raising is Normal   Strength & tone: Normal   Stability overall normal stability  Her right knee has ROM of 0-110 with slight crepitus and trace effusion.  She has a limp to the right.    The patient has been educated about the nature of the problem(s) and counseled on treatment options.  The patient appeared to understand what I have discussed and is in agreement with it.  Encounter Diagnoses  Name Primary?  . Left-sided low back pain with left-sided sciatica Yes  . Knee pain, right   . Atrial fibrillation, chronic ([place])   . Essential hypertension   .  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  She is on Coumadin and it is well controlled.  She has underlying atrial fibrillation, chronic.  She has history of lymphoma.  She does smoke and I have again asked to to cut back.  She says she knows she should but cannot.  PLAN Call if any problems.  Precautions discussed.  Continue current medications.   Return to clinic 2 weeks  I have given Prednisone dose pack to take.

## 2015-06-04 ENCOUNTER — Encounter (HOSPITAL_COMMUNITY): Payer: Medicare Other | Attending: Hematology and Oncology | Admitting: Hematology & Oncology

## 2015-06-04 ENCOUNTER — Encounter (HOSPITAL_COMMUNITY): Payer: Medicare Other

## 2015-06-04 ENCOUNTER — Encounter (HOSPITAL_COMMUNITY): Payer: Self-pay | Admitting: Hematology & Oncology

## 2015-06-04 VITALS — BP 145/77 | HR 73 | Temp 98.7°F | Resp 18 | Wt 109.5 lb

## 2015-06-04 DIAGNOSIS — I482 Chronic atrial fibrillation: Secondary | ICD-10-CM | POA: Diagnosis not present

## 2015-06-04 DIAGNOSIS — F329 Major depressive disorder, single episode, unspecified: Secondary | ICD-10-CM | POA: Diagnosis not present

## 2015-06-04 DIAGNOSIS — Z7901 Long term (current) use of anticoagulants: Secondary | ICD-10-CM | POA: Insufficient documentation

## 2015-06-04 DIAGNOSIS — C8519 Unspecified B-cell lymphoma, extranodal and solid organ sites: Secondary | ICD-10-CM | POA: Diagnosis not present

## 2015-06-04 DIAGNOSIS — M47817 Spondylosis without myelopathy or radiculopathy, lumbosacral region: Secondary | ICD-10-CM | POA: Insufficient documentation

## 2015-06-04 DIAGNOSIS — Z9089 Acquired absence of other organs: Secondary | ICD-10-CM | POA: Diagnosis not present

## 2015-06-04 DIAGNOSIS — Z980 Intestinal bypass and anastomosis status: Secondary | ICD-10-CM | POA: Insufficient documentation

## 2015-06-04 DIAGNOSIS — C8580 Other specified types of non-Hodgkin lymphoma, unspecified site: Secondary | ICD-10-CM

## 2015-06-04 DIAGNOSIS — J449 Chronic obstructive pulmonary disease, unspecified: Secondary | ICD-10-CM | POA: Diagnosis not present

## 2015-06-04 DIAGNOSIS — Z90710 Acquired absence of both cervix and uterus: Secondary | ICD-10-CM | POA: Diagnosis not present

## 2015-06-04 DIAGNOSIS — F17210 Nicotine dependence, cigarettes, uncomplicated: Secondary | ICD-10-CM | POA: Insufficient documentation

## 2015-06-04 DIAGNOSIS — I10 Essential (primary) hypertension: Secondary | ICD-10-CM | POA: Insufficient documentation

## 2015-06-04 DIAGNOSIS — Z8572 Personal history of non-Hodgkin lymphomas: Secondary | ICD-10-CM | POA: Diagnosis not present

## 2015-06-04 DIAGNOSIS — Z9079 Acquired absence of other genital organ(s): Secondary | ICD-10-CM | POA: Diagnosis not present

## 2015-06-04 DIAGNOSIS — Z720 Tobacco use: Secondary | ICD-10-CM | POA: Diagnosis not present

## 2015-06-04 LAB — CBC WITH DIFFERENTIAL/PLATELET
BASOS ABS: 0 10*3/uL (ref 0.0–0.1)
BASOS PCT: 0 %
EOS ABS: 0.2 10*3/uL (ref 0.0–0.7)
EOS PCT: 2 %
HCT: 37.9 % (ref 36.0–46.0)
Hemoglobin: 12.5 g/dL (ref 12.0–15.0)
Lymphocytes Relative: 26 %
Lymphs Abs: 2.2 10*3/uL (ref 0.7–4.0)
MCH: 31.2 pg (ref 26.0–34.0)
MCHC: 33 g/dL (ref 30.0–36.0)
MCV: 94.5 fL (ref 78.0–100.0)
MONO ABS: 0.8 10*3/uL (ref 0.1–1.0)
Monocytes Relative: 9 %
Neutro Abs: 5.2 10*3/uL (ref 1.7–7.7)
Neutrophils Relative %: 63 %
PLATELETS: 273 10*3/uL (ref 150–400)
RBC: 4.01 MIL/uL (ref 3.87–5.11)
RDW: 14.8 % (ref 11.5–15.5)
WBC: 8.3 10*3/uL (ref 4.0–10.5)

## 2015-06-04 LAB — COMPREHENSIVE METABOLIC PANEL
ALBUMIN: 3.8 g/dL (ref 3.5–5.0)
ALT: 32 U/L (ref 14–54)
AST: 32 U/L (ref 15–41)
Alkaline Phosphatase: 53 U/L (ref 38–126)
Anion gap: 8 (ref 5–15)
BUN: 10 mg/dL (ref 6–20)
CHLORIDE: 101 mmol/L (ref 101–111)
CO2: 31 mmol/L (ref 22–32)
Calcium: 8.6 mg/dL — ABNORMAL LOW (ref 8.9–10.3)
Creatinine, Ser: 0.58 mg/dL (ref 0.44–1.00)
GFR calc Af Amer: >60 mL/min (ref >60)
GLUCOSE: 102 mg/dL — AB (ref 65–99)
POTASSIUM: 4.4 mmol/L (ref 3.5–5.1)
SODIUM: 140 mmol/L (ref 135–145)
Total Bilirubin: 0.4 mg/dL (ref 0.3–1.2)
Total Protein: 6.9 g/dL (ref 6.5–8.1)

## 2015-06-04 LAB — C-REACTIVE PROTEIN: CRP: 1.6 mg/dL — ABNORMAL HIGH (ref <1.0)

## 2015-06-04 LAB — SEDIMENTATION RATE: Sed Rate: 10 mm/hr (ref 0–22)

## 2015-06-04 LAB — LACTATE DEHYDROGENASE: LDH: 183 U/L (ref 98–192)

## 2015-06-04 NOTE — Patient Instructions (Addendum)
[place] at [facility] Discharge Instructions  RECOMMENDATIONS MADE BY THE CONSULTANT AND ANY TEST RESULTS WILL BE SENT TO YOUR REFERRING PHYSICIAN.  Exam done and seen today by Dr. Whitney Muse Reviewed labs today Will get labs in 3 months Mammogram scheduled when she comes back Return to see the doctor in 3 months Please call the clinic if you have any questions or concerns  Thank you for choosing [place] at [facility] to provide your oncology and hematology care.  To afford each patient quality time with our provider, please arrive at least 15 minutes before your scheduled appointment time.   Beginning January 23rd 2017 lab work for the [facility] will be done in the  Main lab at [facility] on 1st floor. If you have a lab appointment with the [facility] please come in thru the  Main Entrance and check in at the main information desk  You need to re-schedule your appointment should you arrive 10 or more minutes late.  We strive to give you quality time with our providers, and arriving late affects you and other patients whose appointments are after yours.  Also, if you no show three or more times for appointments you may be dismissed from the clinic at the providers discretion.     Again, thank you for choosing [facility].  Our hope is that these requests will decrease the amount of time that you wait before being seen by our physicians.       _____________________________________________________________  Should you have questions after your visit to [facility], please contact our office at [phone] between the hours of 8:30 a.m. and 4:30 p.m.  Voicemails left after 4:30 p.m. will not be returned until the following business day.  For prescription refill requests, have your pharmacy contact our office.         Resources For Cancer Patients and their Caregivers ? American Cancer Society: Can  assist with transportation, wigs, general needs, runs Look Good Feel Better.        661-108-6580 ? Cancer Care: Provides financial assistance, online support groups, medication/co-pay assistance.  1-800-813-HOPE 743-625-3463) ? [place] Assists [place] cancer patients and their families through emotional , educational and financial support.  787-248-9978 ? [place] DSS Where to apply for food stamps, Medicaid and utility assistance. (419) 283-7652 ? [facility]: Transportation to medical appointments. (240) 035-1001 ? Social Security Administration: May apply for disability if have a Stage IV cancer. 930-767-8538 404-027-8465 ? [employer], Disability and Transit Services: Assists with nutrition, care and transit needs. (564)156-1217
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
## 2015-06-05 LAB — BETA 2 MICROGLOBULIN, SERUM: BETA 2 MICROGLOBULIN: 1.7 mg/L (ref 0.6–2.4)

## 2015-06-06 ENCOUNTER — Ambulatory Visit (INDEPENDENT_AMBULATORY_CARE_PROVIDER_SITE_OTHER): Payer: Medicare Other | Admitting: Orthopaedic Surgery

## 2015-06-06 ENCOUNTER — Encounter: Payer: Self-pay | Admitting: Orthopaedic Surgery

## 2015-06-06 VITALS — BP 129/90 | HR 84 | Temp 98.1°F | Ht <= 58 in | Wt 109.0 lb

## 2015-06-06 DIAGNOSIS — I482 Chronic atrial fibrillation, unspecified: Secondary | ICD-10-CM

## 2015-06-06 DIAGNOSIS — M5442 Lumbago with sciatica, left side: Secondary | ICD-10-CM

## 2015-06-06 DIAGNOSIS — C8580 Other specified types of non-Hodgkin lymphoma, unspecified site: Secondary | ICD-10-CM

## 2015-06-06 DIAGNOSIS — M25561 Pain in right knee: Secondary | ICD-10-CM

## 2015-06-06 DIAGNOSIS — I10 Essential (primary) hypertension: Secondary | ICD-10-CM | POA: Diagnosis not present

## 2015-06-06 NOTE — Patient Instructions (Signed)
We will schedule MRI and call you with appointment

## 2015-06-06 NOTE — Progress Notes (Signed)
Patient NA:[name], female DOB:1941-01-17, 75 y.o. UKG:[id number]  Chief Complaint  Patient presents with  . Follow-up    back pain    HPI  Marissa Turner is a 75 y.o. female who has lower back pain with sciatica on the left side.  She is much worse.  She had only a slight improvement with the prednisone dose pack.  She has more pain even at night and laying down.  She has no new trauma.  She has no bowel or bladder problem or weakness.  I will get a MRI of the lower back.  HPI  Body mass index is 22.79 kg/(m^2).  Review of Systems  HENT: Negative for congestion.   Respiratory: Negative for cough and shortness of breath.   Cardiovascular: Positive for palpitations. Negative for chest pain and leg swelling.  Endocrine: Positive for cold intolerance.  Musculoskeletal: Positive for back pain, joint swelling, arthralgias and gait problem.  Allergic/Immunologic: Positive for environmental allergies.  Psychiatric/Behavioral: The patient is nervous/anxious.     Past Medical History  Diagnosis Date  . Atrial fibrillation, chronic ([place]) 2004    left atrial thrombus in 2004; near syncope in 2008; echocardiogram in 2008-normal EF, mild left atrial enlargement, no valvular abnormalities, lipomatous hypertrophy; no aortic atheroma on TEE in 2004  . Chronic anticoagulation 2004    warfarin  . Tobacco abuse     40 pack years; quit attempt in 2012  . DJD (degenerative joint disease), lumbosacral     Also hip  . Peptic ulcer disease     s/p Billroth II  . Depression   . Fracture of wrist     Left  . Hypertension     Normal carotid ultrasound in 2008  . PONV (postoperative nausea and vomiting)   . Dysrhythmia     chronic AFib  . Cancer ([place])     LYMPH NODE    Past Surgical History  Procedure Laterality Date  . Cholecystectomy  2004    [place] II; splenectomy; incidental appendectomy  . Abdominal hysterectomy      w/o  oophorectomy  . Cesarean section    . Tonsillectomy    . Colonoscopy  1980s    patient denies ever having undergone colonoscopy as of 2014  . Appendectomy    . Splenectomy, total    . Lymph node biopsy Left 09/15/2013    Procedure: LEFT NECK CERVICAL NODE BIOPSY;  Surgeon: Scherry Ran, MD;  Location: [facility];  Service: General;  Laterality: Left;  . Portacath placement Right 10/21/2013    Procedure: ATTEMPTED INSERTION PORT-A-CATH;  Surgeon: Scherry Ran, MD;  Location: [facility];  Service: General;  Laterality: Right;  . Port-a-cath removal Right 07/12/2014    Procedure: REMOVAL PORT-A-CATH;  Surgeon: Aviva Signs Md, MD;  Location: [facility];  Service: General;  Laterality: Right;  . Knee arthroscopy with medial menisectomy Right 02/26/2015    Procedure: KNEE ARTHROSCOPY WITH MEDIAL MENISECTOMY;  Surgeon: Sanjuana Kava, MD;  Location: [facility];  Service: Orthopedics;  Laterality: Right;    Family History  Problem Relation Age of Onset  . Heart attack Mother     also brother  . Breast cancer Sister   . Heart attack Brother   . Breast cancer Sister     x2  . Cirrhosis Sister   . Cancer Brother     Gastric carcinoma; also father    Social History Social History  Substance  Use Topics  . Smoking status: Current Every Day Smoker -- 0.25 packs/day for 55 years    Types: Cigarettes  . Smokeless tobacco: None     Comment: Quit attempt in 2012  . Alcohol Use: No    Allergies  Allergen Reactions  . Diphenhydramine Hcl Other (See Comments)    hallucinations   . Estrogens Other (See Comments)    edema  . Penicillins Other (See Comments)    Break out in whelps.  . Codeine Swelling and Rash    Current Outpatient Prescriptions  Medication Sig Dispense Refill  . acetaminophen (TYLENOL) 500 MG tablet Take 1,000 mg by mouth daily as needed for headache.    . ALPRAZolam (XANAX) 1 MG tablet TAKE ONE (1) TABLET THREE (3) TIMES EACHDAY ASN NEEDED FOR ANXIETY 90 tablet 2  .  citalopram (CELEXA) 20 MG tablet TAKE ONE (1) TABLET EACH DAY 30 tablet 3  . digoxin (LANOXIN) 0.125 MG tablet Take 125 mcg by mouth daily.     [name] diltiazem (DILACOR XR) 120 MG 24 hr capsule Take 120 mg by mouth daily.    . Diphenhyd-Hydrocort-Nystatin (FIRST-DUKES MOUTHWASH) SUSP Use as directed 15 mLs in the mouth or throat 4 (four) times daily as needed. 300 mL 0  . metoprolol (TOPROL-XL) 25 MG 24 hr tablet Take 12.5 mg by mouth 2 (two) times daily.     . Prenatal Vit-Fe Fumarate-FA (PRENATAL PO) Take 1 tablet by mouth daily.    [name] warfarin (COUMADIN) 5 MG tablet Take 1 tablet daily except 1/2 tablet on Mondays and Fridays (Patient taking differently: Take 1 tablet daily except 1/2 tablet on Fridays) 30 tablet 6   No current facility-administered medications for this visit.     Physical Exam  Blood pressure 129/90, pulse 84, temperature 98.1 F (36.7 C), height '4\' 10"'$  (1.473 m), weight 109 lb (49.442 kg).  Constitutional: overall normal hygiene, normal nutrition, well developed, normal grooming, normal body habitus. Assistive device:none  Musculoskeletal: gait and station Limp left, muscle tone and strength are normal, no tremors or atrophy is present.  .  Neurological: coordination overall normal.  Deep tendon reflex/nerve stretch intact.  Sensation normal.  Cranial nerves II-XII intact.   Skin:   Scars right knee surgery, otherwise overall no scars, lesions, ulcers or rashes. No psoriasis.  Psychiatric: Alert and oriented x 3.  Recent memory intact, remote memory unclear.  Normal mood and affect. Well groomed.  Good eye contact.  Cardiovascular: overall no swelling, no varicosities, no edema bilaterally, normal temperatures of the legs and arms, no clubbing, cyanosis and good capillary refill.  Lymphatic: palpation is normal. Spine/Pelvis examination:  Inspection:  Overall, sacoiliac joint benign and hips nontender; without crepitus or defects.   Thoracic spine inspection:  Alignment normal without kyphosis present   Lumbar spine inspection:  Alignment  with normal lumbar lordosis, without scoliosis apparent.   Thoracic spine palpation:  without tenderness of spinal processes   Lumbar spine palpation: with tenderness of lumbar area; with tightness of lumbar muscles    Range of Motion:   Lumbar flexion, forward flexion is 15  with pain or tenderness    Lumbar extension is 5  with pain or tenderness   Left lateral bend is Abnormal- 5 degrees  with pain or tenderness   Right lateral bend is Abnormal- 5 degrees with pain or tenderness   Straight leg raising is Abnormal- 25 degrees left.   Strength & tone: Normal   Stability overall normal stability  The patient has been educated about the nature of the problem(s) and counseled on treatment options.  The patient appeared to understand what I have discussed and is in agreement with it.  Encounter Diagnoses  Name Primary?  . Left-sided low back pain with left-sided sciatica Yes  . Knee pain, right   . Atrial fibrillation, chronic ([place])   . Essential hypertension   . Lymphoma malignant, large cell ([place])     PLAN Call if any problems.  Precautions discussed.  Continue current medications.   Return to clinic get MRI of the lumbar spine with contrast.

## 2015-06-11 ENCOUNTER — Encounter (HOSPITAL_COMMUNITY): Payer: Self-pay | Admitting: Physical Therapy

## 2015-06-11 NOTE — Therapy (Signed)
[facility] [place], [place], [address] Phone: [phone]   Fax:  [phone]  Patient Details  Name: Marissa Turner MRN: [id number] Date of Birth: 1940-05-06 Referring Provider:  No ref. provider found  Encounter Date: 06/11/2015   PHYSICAL THERAPY DISCHARGE SUMMARY  Visits from Start of Care: 3  Current functional level related to goals / functional outcomes: Patient has not returned since last skilled visit and accumulated 3 consecutive no shows, DC per clinic policy    Remaining deficits: Unable to assess    Education / Equipment: N/A  Plan: Patient agrees to discharge.  Patient goals were not met. Patient is being discharged due to not returning since the last visit.  ?????        Deniece Ree PT, DPT [facility] [address] [place], [place], [address] Phone: [phone]   Fax:  [phone]

## 2015-06-13 ENCOUNTER — Ambulatory Visit (HOSPITAL_COMMUNITY): Payer: Medicare Other

## 2015-06-18 ENCOUNTER — Ambulatory Visit (INDEPENDENT_AMBULATORY_CARE_PROVIDER_SITE_OTHER): Payer: Medicare Other | Admitting: *Deleted

## 2015-06-18 DIAGNOSIS — Z5181 Encounter for therapeutic drug level monitoring: Secondary | ICD-10-CM

## 2015-06-18 DIAGNOSIS — I482 Chronic atrial fibrillation, unspecified: Secondary | ICD-10-CM

## 2015-06-18 LAB — POCT INR: INR: 3

## 2015-06-26 ENCOUNTER — Ambulatory Visit (INDEPENDENT_AMBULATORY_CARE_PROVIDER_SITE_OTHER): Payer: Medicare Other | Admitting: Orthopaedic Surgery

## 2015-06-26 ENCOUNTER — Encounter: Payer: Self-pay | Admitting: Orthopaedic Surgery

## 2015-06-26 VITALS — BP 128/71 | HR 75 | Temp 97.3°F | Ht <= 58 in | Wt 109.0 lb

## 2015-06-26 DIAGNOSIS — M25561 Pain in right knee: Secondary | ICD-10-CM | POA: Diagnosis not present

## 2015-06-26 DIAGNOSIS — M5442 Lumbago with sciatica, left side: Secondary | ICD-10-CM | POA: Diagnosis not present

## 2015-06-26 MED ORDER — HYDROCODONE-ACETAMINOPHEN 5-325 MG PO TABS: 1.0000 | ORAL_TABLET | ORAL | Status: DC | PRN

## 2015-06-26 NOTE — Patient Instructions (Signed)
Return after MRI of lumbar spine

## 2015-06-26 NOTE — Progress Notes (Signed)
[name], female DOB:1940-07-22, 75 y.o. KGM:[id number]  Chief Complaint  Patient presents with  . Follow-up    Bilateral knee pain    HPI  Marissa Turner is a 75 y.o. female who has bilateral knee pain and left sided sciatica.  She was to have had a MRI of the lumbar spine ordered on May 10th, her last visit.  For some reason, the MRI was not done and not ordered via the computer system.  It was in the system to be ordered but the order did not go through.  She said she is almost out of pain medicine.  Her back hurts more, she has left sided sciatica that is not getting any better.  We apologized the MRI was not done.  We called and arranged for a MRI of the lumbar spine Monday of next week.  She is agreeable to this.  I will see her a day or two later.  I will refill her pain medicine.   HPI  Body mass index is 22.79 kg/(m^2).  ROS  Review of Systems  HENT: Negative for congestion.   Respiratory: Negative for cough and shortness of breath.   Cardiovascular: Positive for palpitations. Negative for chest pain and leg swelling.  Endocrine: Positive for cold intolerance.  Musculoskeletal: Positive for back pain, joint swelling, arthralgias and gait problem.  Allergic/Immunologic: Positive for environmental allergies.  Psychiatric/Behavioral: The patient is nervous/anxious.     Past Medical History  Diagnosis Date  . Atrial fibrillation, chronic ([place]) 2004    left atrial thrombus in 2004; near syncope in 2008; echocardiogram in 2008-normal EF, mild left atrial enlargement, no valvular abnormalities, lipomatous hypertrophy; no aortic atheroma on TEE in 2004  . Chronic anticoagulation 2004    warfarin  . Tobacco abuse     40 pack years; quit attempt in 2012  . DJD (degenerative joint disease), lumbosacral     Also hip  . Peptic ulcer disease     s/p Billroth II  . Depression   . Fracture of wrist     Left  . Hypertension     Normal carotid ultrasound in 2008  .  PONV (postoperative nausea and vomiting)   . Dysrhythmia     chronic AFib  . 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 Cholecystectomy  2004    [place] II; splenectomy; incidental appendectomy  . Abdominal hysterectomy      w/o oophorectomy  . Cesarean section    . Tonsillectomy    . Colonoscopy  1980s    patient denies ever having undergone colonoscopy as of 2014  . Appendectomy    . Splenectomy, total    . Lymph node biopsy Left 09/15/2013    Procedure: LEFT NECK CERVICAL NODE BIOPSY;  Surgeon: Scherry Ran, MD;  Location: [facility];  Service: General;  Laterality: Left;  . Portacath placement Right 10/21/2013    Procedure: ATTEMPTED INSERTION PORT-A-CATH;  Surgeon: Scherry Ran, MD;  Location: [facility];  Service: General;  Laterality: Right;  . Port-a-cath removal Right 07/12/2014    Procedure: REMOVAL PORT-A-CATH;  Surgeon: Aviva Signs Md, MD;  Location: [facility];  Service: General;  Laterality: Right;  . Knee arthroscopy with medial menisectomy Right 02/26/2015    Procedure: KNEE ARTHROSCOPY WITH MEDIAL MENISECTOMY;  Surgeon: Sanjuana Kava, MD;  Location: [facility];  Service: Orthopedics;  Laterality: Right;    Family History  Problem Relation Age of Onset  . Heart attack Mother     also brother  . Breast cancer Sister   . Heart attack Brother   . Breast cancer Sister     x2  . Cirrhosis Sister   . Cancer Brother     Gastric carcinoma; also father    Social History Social History  Substance Use Topics  . Smoking status: Current Every Day Smoker -- 0.25 packs/day for 55 years    Types: Cigarettes  . Smokeless tobacco: None     Comment: Quit attempt in 2012  . Alcohol Use: No    Allergies  Allergen Reactions  . Diphenhydramine Hcl Other (See Comments)    hallucinations   . Estrogens Other (See Comments)    edema  . Penicillins Other (See Comments)    Break out in  whelps.  . Codeine Swelling and Rash    Current Outpatient Prescriptions  Medication Sig Dispense Refill  . acetaminophen (TYLENOL) 500 MG tablet Take 1,000 mg by mouth daily as needed for headache.    . ALPRAZolam (XANAX) 1 MG tablet TAKE ONE (1) TABLET THREE (3) TIMES EACHDAY ASN NEEDED FOR ANXIETY 90 tablet 2  . citalopram (CELEXA) 20 MG tablet TAKE ONE (1) TABLET EACH DAY 30 tablet 3  . digoxin (LANOXIN) 0.125 MG tablet Take 125 mcg by mouth daily.     [name] diltiazem (DILACOR XR) 120 MG 24 hr capsule Take 120 mg by mouth daily.    . Diphenhyd-Hydrocort-Nystatin (FIRST-DUKES MOUTHWASH) SUSP Use as directed 15 mLs in the mouth or throat 4 (four) times daily as needed. 300 mL 0  . metoprolol (TOPROL-XL) 25 MG 24 hr tablet Take 12.5 mg by mouth 2 (two) times daily.     . Prenatal Vit-Fe Fumarate-FA (PRENATAL PO) Take 1 tablet by mouth daily.    [name] warfarin (COUMADIN) 5 MG tablet Take 1 tablet daily except 1/2 tablet on Mondays and Fridays (Patient taking differently: Take 1 tablet daily except 1/2 tablet on Fridays) 30 tablet 6  . HYDROcodone-acetaminophen (NORCO/VICODIN) 5-325 MG tablet Take 1 tablet by mouth every 4 (four) hours as needed for moderate pain (Must last 30 days.  Do not take and drive a car or use machinery.). 120 tablet 0   No current facility-administered medications for this visit.     Physical Exam  Blood pressure 128/71, pulse 75, temperature 97.3 F (36.3 C), height '4\' 10"'$  (1.473 m), weight 109 lb (49.442 kg).  Constitutional: overall normal hygiene, normal nutrition, well developed, normal grooming, normal body habitus. Assistive device:none  Musculoskeletal: gait and station Limp right, muscle tone and strength are normal, no tremors or atrophy is present.  .  Neurological: coordination overall normal.  Deep tendon reflex/nerve stretch intact.  Sensation normal.  Cranial nerves II-XII intact.   Skin:   Right knee from arthroscopy, otherwise overall no scars,  lesions, ulcers or rashes. No psoriasis.  Psychiatric: Alert and oriented x 3.  Recent memory intact, remote memory unclear.  Normal mood and affect. Well groomed.  Good eye contact.  Cardiovascular: overall no swelling, no varicosities, no edema bilaterally, normal temperatures of the legs and arms, no clubbing, cyanosis and good capillary refill.  Lymphatic: palpation is normal.  Spine/Pelvis examination:  Inspection:  Overall, sacoiliac joint benign and hips nontender; without crepitus or defects.   Thoracic spine inspection: Alignment normal without kyphosis present   Lumbar spine inspection:  Alignment  with normal lumbar  lordosis, without scoliosis apparent.   Thoracic spine palpation:  without tenderness of spinal processes   Lumbar spine palpation: with tenderness of lumbar area; with tightness of lumbar muscles    Range of Motion:   Lumbar flexion, forward flexion is 20  with pain or tenderness    Lumbar extension is 5  with pain or tenderness   Left lateral bend is Normal  without pain or tenderness   Right lateral bend is Normal without pain or tenderness   Straight leg raising is Normal   Strength & tone: Normal   Stability overall normal stability     The patient has been educated about the nature of the problem(s) and counseled on treatment options.  The patient appeared to understand what I have discussed and is in agreement with it.  Encounter Diagnoses  Name Primary?  . Left-sided low back pain with left-sided sciatica Yes  . Knee pain, right     PLAN Call if any problems.  Precautions discussed.  Continue current medications.   Return to clinic after MRI with contrast of lumbar spine

## 2015-07-02 ENCOUNTER — Other Ambulatory Visit: Payer: Self-pay | Admitting: Orthopaedic Surgery

## 2015-07-02 ENCOUNTER — Ambulatory Visit (HOSPITAL_COMMUNITY)
Admission: RE | Admit: 2015-07-02 | Discharge: 2015-07-02 | Disposition: A | Discharge status: Home / Self Care | Payer: Medicare Other | Source: Ambulatory Visit | Attending: Orthopaedic Surgery | Admitting: Orthopaedic Surgery

## 2015-07-02 DIAGNOSIS — M4806 Spinal stenosis, lumbar region: Secondary | ICD-10-CM | POA: Diagnosis not present

## 2015-07-02 DIAGNOSIS — M47896 Other spondylosis, lumbar region: Secondary | ICD-10-CM | POA: Diagnosis not present

## 2015-07-02 DIAGNOSIS — M5442 Lumbago with sciatica, left side: Secondary | ICD-10-CM

## 2015-07-02 DIAGNOSIS — M5126 Other intervertebral disc displacement, lumbar region: Secondary | ICD-10-CM | POA: Diagnosis not present

## 2015-07-02 MED ORDER — GADOBENATE DIMEGLUMINE 529 MG/ML IV SOLN
10.0000 mL | Freq: Once | INTRAVENOUS | Status: AC | PRN
  Administered 2015-07-02: 9 mL via INTRAVENOUS

## 2015-07-04 ENCOUNTER — Ambulatory Visit: Payer: Medicare Other | Admitting: Orthopaedic Surgery

## 2015-07-11 ENCOUNTER — Ambulatory Visit (INDEPENDENT_AMBULATORY_CARE_PROVIDER_SITE_OTHER): Payer: Medicare Other | Admitting: Orthopaedic Surgery

## 2015-07-11 ENCOUNTER — Encounter: Payer: Self-pay | Admitting: Orthopaedic Surgery

## 2015-07-11 VITALS — BP 140/81 | HR 82 | Temp 98.8°F | Ht 60.0 in | Wt 107.0 lb

## 2015-07-11 DIAGNOSIS — M5442 Lumbago with sciatica, left side: Secondary | ICD-10-CM

## 2015-07-11 DIAGNOSIS — I482 Chronic atrial fibrillation, unspecified: Secondary | ICD-10-CM

## 2015-07-11 DIAGNOSIS — M25561 Pain in right knee: Secondary | ICD-10-CM

## 2015-07-11 DIAGNOSIS — I10 Essential (primary) hypertension: Secondary | ICD-10-CM

## 2015-07-11 NOTE — Progress Notes (Signed)
Patient Marissa Turner, female DOB:[birth year], 75 y.o. AST:[id number]  Chief Complaint  Patient presents with  . Results    MRI L Spine    HPI  Margaret Cockerill Bulls is a 75 y.o. female who has lower back pain.  She had a MRI done which showed:  IMPRESSION: 1. Mild progression of lower lumbar spine degeneration since 2010, chiefly consisting of facet arthropathy from L3-L4 through L5-S1. Borderline to mild bilateral lateral recess stenosis at L4-L5. No other significant stenosis. 2. Minimal to mild for age lumbar spine degeneration elsewhere. No metastatic disease identified.  I have explained the findings to her.  She appears to understand.  She is to continue her exercises and activities and medicine.  HPI  Body mass index is 20.9 kg/(m^2).  ROS  Review of Systems  HENT: Negative for congestion.   Respiratory: Negative for cough and shortness of breath.   Cardiovascular: Positive for palpitations. Negative for chest pain and leg swelling.  Endocrine: Positive for cold intolerance.  Musculoskeletal: Positive for back pain, joint swelling, arthralgias and gait problem.  Allergic/Immunologic: Positive for environmental allergies.  Psychiatric/Behavioral: The patient is nervous/anxious.     Past Medical History  Diagnosis Date  . Atrial fibrillation, chronic ([place]) 2004    left atrial thrombus in 2004; near syncope in 2008; echocardiogram in 2008-normal EF, mild left atrial enlargement, no valvular abnormalities, lipomatous hypertrophy; no aortic atheroma on TEE in 2004  . Chronic anticoagulation 2004    warfarin  . Tobacco abuse     40 pack years; quit attempt in 2012  . DJD (degenerative joint disease), lumbosacral     Also hip  . Peptic ulcer disease     s/p Billroth II  . Depression   . Fracture of wrist     Left  . Hypertension     Normal carotid ultrasound in 2008  . PONV (postoperative nausea and vomiting)   . Dysrhythmia     chronic AFib  . Cancer ([place])    LYMPH NODE    Past Surgical History  Procedure Laterality Date  . Cholecystectomy  2004    [place] II; splenectomy; incidental appendectomy  . Abdominal hysterectomy      w/o oophorectomy  . Cesarean section    . Tonsillectomy    . Colonoscopy  1980s    patient denies ever having undergone colonoscopy as of 2014  . Appendectomy    . Splenectomy, total    . Lymph node biopsy Left 09/15/2013    Procedure: LEFT NECK CERVICAL NODE BIOPSY;  Surgeon: Scherry Ran, MD;  Location: [facility];  Service: General;  Laterality: Left;  . Portacath placement Right 10/21/2013    Procedure: ATTEMPTED INSERTION PORT-A-CATH;  Surgeon: Scherry Ran, MD;  Location: [facility];  Service: General;  Laterality: Right;  . Port-a-cath removal Right 07/12/2014    Procedure: REMOVAL PORT-A-CATH;  Surgeon: Aviva Signs Md, MD;  Location: [facility];  Service: General;  Laterality: Right;  . Knee arthroscopy with medial menisectomy Right 02/26/2015    Procedure: KNEE ARTHROSCOPY WITH MEDIAL MENISECTOMY;  Surgeon: Sanjuana Kava, MD;  Location: [facility];  Service: Orthopedics;  Laterality: Right;    Family History  Problem Relation Age of Onset  . Heart attack Mother     also brother  . Breast cancer Sister   . Heart attack Brother   . Breast cancer Sister     x2  . Cirrhosis Sister   .  Cancer Brother     Gastric carcinoma; also father    Social History Social History  Substance Use Topics  . Smoking status: Current Every Day Smoker -- 0.25 packs/day for 55 years    Types: Cigarettes  . Smokeless tobacco: None     Comment: Quit attempt in 2012  . Alcohol Use: No    Allergies  Allergen Reactions  . Diphenhydramine Hcl Other (See Comments)    hallucinations   . Estrogens Other (See Comments)    edema  . Penicillins Other (See Comments)    Break out in whelps.  . Codeine Swelling and Rash    Current Outpatient Prescriptions  Medication Sig  Dispense Refill  . acetaminophen (TYLENOL) 500 MG tablet Take 1,000 mg by mouth daily as needed for headache.    . ALPRAZolam (XANAX) 1 MG tablet TAKE ONE (1) TABLET THREE (3) TIMES EACHDAY ASN NEEDED FOR ANXIETY 90 tablet 2  . citalopram (CELEXA) 20 MG tablet TAKE ONE (1) TABLET EACH DAY 30 tablet 3  . digoxin (LANOXIN) 0.125 MG tablet Take 125 mcg by mouth daily.     [name] diltiazem (DILACOR XR) 120 MG 24 hr capsule Take 120 mg by mouth daily.    . Diphenhyd-Hydrocort-Nystatin (FIRST-DUKES MOUTHWASH) SUSP Use as directed 15 mLs in the mouth or throat 4 (four) times daily as needed. 300 mL 0  . HYDROcodone-acetaminophen (NORCO/VICODIN) 5-325 MG tablet Take 1 tablet by mouth every 4 (four) hours as needed for moderate pain (Must last 30 days.  Do not take and drive a car or use machinery.). 120 tablet 0  . metoprolol (TOPROL-XL) 25 MG 24 hr tablet Take 12.5 mg by mouth 2 (two) times daily.     . Prenatal Vit-Fe Fumarate-FA (PRENATAL PO) Take 1 tablet by mouth daily.    [name] warfarin (COUMADIN) 5 MG tablet Take 1 tablet daily except 1/2 tablet on Mondays and Fridays (Patient taking differently: Take 1 tablet daily except 1/2 tablet on Fridays) 30 tablet 6   No current facility-administered medications for this visit.     Physical Exam  Blood pressure 140/81, pulse 82, temperature 98.8 F (37.1 C), height 5' (1.524 m), weight 107 lb (48.535 kg).  Constitutional: overall normal hygiene, normal nutrition, well developed, normal grooming, normal body habitus. Assistive device:none  Musculoskeletal: gait and station Limp none, muscle tone and strength are normal, no tremors or atrophy is present.  .  Neurological: coordination overall normal.  Deep tendon reflex/nerve stretch intact.  Sensation normal.  Cranial nerves II-XII intact.   Skin:   normal overall no scars, lesions, ulcers or rashes. No psoriasis.  Psychiatric: Alert and oriented x 3.  Recent memory intact, remote memory unclear.  Normal  mood and affect. Well groomed.  Good eye contact.  Cardiovascular: overall no swelling, no varicosities, no edema bilaterally, normal temperatures of the legs and arms, no clubbing, cyanosis and good capillary refill.  Lymphatic: palpation is normal.  Spine/Pelvis examination:  Inspection:  Overall, sacoiliac joint benign and hips nontender; without crepitus or defects.   Thoracic spine inspection: Alignment normal without kyphosis present   Lumbar spine inspection:  Alignment  with normal lumbar lordosis, without scoliosis apparent.   Thoracic spine palpation:  with tenderness of spinal processes   Lumbar spine palpation: with tenderness of lumbar area; without tightness of lumbar muscles    Range of Motion:   Lumbar flexion, forward flexion is 35  without pain or tenderness    Lumbar extension is 5  without  pain or tenderness   Left lateral bend is Normal  without pain or tenderness   Right lateral bend is Normal without pain or tenderness   Straight leg raising is Normal   Strength & tone: Normal   Stability overall normal stability    The patient has been educated about the nature of the problem(s) and counseled on treatment options.  The patient appeared to understand what I have discussed and is in agreement with it.  Encounter Diagnoses  Name Primary?  . Left-sided low back pain with left-sided sciatica Yes  . Knee pain, right   . Atrial fibrillation, chronic ([place])   . Essential hypertension     PLAN Call if any problems.  Precautions discussed.  Continue current medications.   Return to clinic 1 month   Electronically Signed Sanjuana Kava, MD 6/14/20173:53 PM

## 2015-07-16 ENCOUNTER — Ambulatory Visit (INDEPENDENT_AMBULATORY_CARE_PROVIDER_SITE_OTHER): Payer: Medicare Other | Admitting: *Deleted

## 2015-07-16 DIAGNOSIS — I482 Chronic atrial fibrillation, unspecified: Secondary | ICD-10-CM

## 2015-07-16 DIAGNOSIS — Z5181 Encounter for therapeutic drug level monitoring: Secondary | ICD-10-CM | POA: Diagnosis not present

## 2015-07-16 LAB — POCT INR: INR: 3.3

## 2015-07-25 ENCOUNTER — Other Ambulatory Visit (HOSPITAL_COMMUNITY): Payer: Self-pay | Admitting: *Deleted

## 2015-07-25 DIAGNOSIS — F32A Depression, unspecified: Secondary | ICD-10-CM

## 2015-07-25 DIAGNOSIS — F329 Major depressive disorder, single episode, unspecified: Secondary | ICD-10-CM

## 2015-07-25 MED ORDER — CITALOPRAM HYDROBROMIDE 20 MG PO TABS: 20.0000 mg | ORAL_TABLET | Freq: Every day | ORAL | Status: DC

## 2015-08-08 ENCOUNTER — Ambulatory Visit (INDEPENDENT_AMBULATORY_CARE_PROVIDER_SITE_OTHER): Payer: Medicare Other | Admitting: Orthopaedic Surgery

## 2015-08-08 ENCOUNTER — Encounter: Payer: Self-pay | Admitting: Orthopaedic Surgery

## 2015-08-08 VITALS — BP 126/67 | HR 69 | Temp 96.8°F | Ht 61.0 in | Wt 107.6 lb

## 2015-08-08 DIAGNOSIS — M25561 Pain in right knee: Secondary | ICD-10-CM | POA: Diagnosis not present

## 2015-08-08 DIAGNOSIS — M7062 Trochanteric bursitis, left hip: Secondary | ICD-10-CM

## 2015-08-08 DIAGNOSIS — M5442 Lumbago with sciatica, left side: Secondary | ICD-10-CM | POA: Diagnosis not present

## 2015-08-08 DIAGNOSIS — I10 Essential (primary) hypertension: Secondary | ICD-10-CM | POA: Diagnosis not present

## 2015-08-08 MED ORDER — HYDROCODONE-ACETAMINOPHEN 5-325 MG PO TABS: 1.0000 | ORAL_TABLET | ORAL | Status: DC | PRN

## 2015-08-08 MED ORDER — PREDNISONE 10 MG (21) PO TBPK: ORAL_TABLET | ORAL | Status: DC

## 2015-08-08 NOTE — Progress Notes (Signed)
Patient Marissa Turner, female DOB:March 16, 1940, 75 y.o. NOI:[id number]  Chief Complaint  Patient presents with  . Follow-up    BACK AND RIGHT KNEE PAIN    HPI  Marissa Turner is a 75 y.o. female who has lower back pain and right knee pain.  She has more right knee pain over the last few days with some swelling.  She picked blackberries and had more knee pain after doing that.  She did not fall or twist her knee.    She has pain of the left hip laterally today.  She has no redness or swelling. She has tried ice to it with some help.  HPI  Body mass index is 20.34 kg/(m^2).  ROS  Review of Systems  HENT: Negative for congestion.   Respiratory: Negative for cough and shortness of breath.   Cardiovascular: Positive for palpitations. Negative for chest pain and leg swelling.  Endocrine: Positive for cold intolerance.  Musculoskeletal: Positive for back pain, joint swelling, arthralgias and gait problem.  Allergic/Immunologic: Positive for environmental allergies.  Psychiatric/Behavioral: The patient is nervous/anxious.     Past Medical History  Diagnosis Date  . Atrial fibrillation, chronic ([place]) 2004    left atrial thrombus in 2004; near syncope in 2008; echocardiogram in 2008-normal EF, mild left atrial enlargement, no valvular abnormalities, lipomatous hypertrophy; no aortic atheroma on TEE in 2004  . Chronic anticoagulation 2004    warfarin  . Tobacco abuse     40 pack years; quit attempt in 2012  . DJD (degenerative joint disease), lumbosacral     Also hip  . Peptic ulcer disease     s/p Billroth II  . Depression   . Fracture of wrist     Left  . Hypertension     Normal carotid ultrasound in 2008  . PONV (postoperative nausea and vomiting)   . Dysrhythmia     chronic AFib  . 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 Cholecystectomy  2004    [place] II; splenectomy;  incidental appendectomy  . Abdominal hysterectomy      w/o oophorectomy  . Cesarean section    . Tonsillectomy    . Colonoscopy  1980s    patient denies ever having undergone colonoscopy as of 2014  . Appendectomy    . Splenectomy, total    . Lymph node biopsy Left 09/15/2013    Procedure: LEFT NECK CERVICAL NODE BIOPSY;  Surgeon: Scherry Ran, MD;  Location: [facility];  Service: General;  Laterality: Left;  . Portacath placement Right 10/21/2013    Procedure: ATTEMPTED INSERTION PORT-A-CATH;  Surgeon: Scherry Ran, MD;  Location: [facility];  Service: General;  Laterality: Right;  . Port-a-cath removal Right 07/12/2014    Procedure: REMOVAL PORT-A-CATH;  Surgeon: Aviva Signs Md, MD;  Location: [facility];  Service: General;  Laterality: Right;  . Knee arthroscopy with medial menisectomy Right 02/26/2015    Procedure: KNEE ARTHROSCOPY WITH MEDIAL MENISECTOMY;  Surgeon: Sanjuana Kava, MD;  Location: [facility];  Service: Orthopedics;  Laterality: Right;    Family History  Problem Relation Age of Onset  . Heart attack Mother     also brother  . Breast cancer Sister   . Heart attack Brother   . Breast cancer Sister     x2  . Cirrhosis Sister   . Cancer Brother  Gastric carcinoma; also father    Social History Social History  Substance Use Topics  . Smoking status: Current Every Day Smoker -- 0.25 packs/day for 55 years    Types: Cigarettes  . Smokeless tobacco: None     Comment: Quit attempt in 2012  . Alcohol Use: No    Allergies  Allergen Reactions  . Diphenhydramine Hcl Other (See Comments)    hallucinations   . Estrogens Other (See Comments)    edema  . Penicillins Other (See Comments)    Break out in whelps.  . Codeine Swelling and Rash    Current Outpatient Prescriptions  Medication Sig Dispense Refill  . acetaminophen (TYLENOL) 500 MG tablet Take 1,000 mg by mouth daily as needed for headache.    . ALPRAZolam (XANAX) 1 MG tablet TAKE ONE (1) TABLET THREE (3)  TIMES EACHDAY ASN NEEDED FOR ANXIETY 90 tablet 2  . citalopram (CELEXA) 20 MG tablet Take 1 tablet (20 mg total) by mouth daily. 30 tablet 3  . digoxin (LANOXIN) 0.125 MG tablet Take 125 mcg by mouth daily.     [name] diltiazem (DILACOR XR) 120 MG 24 hr capsule Take 120 mg by mouth daily.    . Diphenhyd-Hydrocort-Nystatin (FIRST-DUKES MOUTHWASH) SUSP Use as directed 15 mLs in the mouth or throat 4 (four) times daily as needed. 300 mL 0  . HYDROcodone-acetaminophen (NORCO/VICODIN) 5-325 MG tablet Take 1 tablet by mouth every 4 (four) hours as needed for moderate pain (Must last 30 days.  Do not take and drive a car or use machinery.). 120 tablet 0  . metoprolol (TOPROL-XL) 25 MG 24 hr tablet Take 12.5 mg by mouth 2 (two) times daily.     . Prenatal Vit-Fe Fumarate-FA (PRENATAL PO) Take 1 tablet by mouth daily.    [name] warfarin (COUMADIN) 5 MG tablet Take 1 tablet daily except 1/2 tablet on Mondays and Fridays (Patient taking differently: Take 1 tablet daily except 1/2 tablet on Fridays) 30 tablet 6  . predniSONE (STERAPRED UNI-PAK 21 TAB) 10 MG (21) TBPK tablet Take six pills the first day;5 pills the next day;4 pills the next day;3 pills the next day; 2 pills the next day,one the final day. 21 tablet 1   No current facility-administered medications for this visit.     Physical Exam  Blood pressure 126/67, pulse 69, temperature 96.8 F (36 C), height '5\' 1"'$  (1.549 m), weight 107 lb 9.6 oz (48.807 kg).  Constitutional: overall normal hygiene, normal nutrition, well developed, normal grooming, normal body habitus. Assistive device:cane  Musculoskeletal: gait and station Limp left, muscle tone and strength are normal, no tremors or atrophy is present.  .  Neurological: coordination overall normal.  Deep tendon reflex/nerve stretch intact.  Sensation normal.  Cranial nerves II-XII intact.   Skin:   normal overall no scars, lesions, ulcers or rashes. No psoriasis.  Psychiatric: Alert and oriented x 3.   Recent memory intact, remote memory unclear.  Normal mood and affect. Well groomed.  Good eye contact.  Cardiovascular: overall no swelling, no varicosities, no edema bilaterally, normal temperatures of the legs and arms, no clubbing, cyanosis and good capillary refill.  Lymphatic: palpation is normal.  The left hip is tender over the trochanteric area laterally.  She has no redness or swelling. ROM is full. She declines an injection.  Right hip is negative.  The right lower extremity is examined:  Inspection:  Thigh:  Non-tender and no defects  Knee has swelling 1+ effusion.  Joint tenderness is present                        Patient is tender over the medial joint line  Lower Leg:  Has normal appearance and no tenderness or defects  Ankle:  Non-tender and no defects  Foot:  Non-tender and no defects Range of Motion:  Knee:  Range of motion is: 0-100                        Crepitus is  present  Ankle:  Range of motion is normal. Strength and Tone:  The right lower extremity has normal strength and tone. Stability:  Knee:  The knee is stable.  Ankle:  The ankle is stable.  Left knee negative.  The patient has been educated about the nature of the problem(s) and counseled on treatment options.  The patient appeared to understand what I have discussed and is in agreement with it.  Encounter Diagnoses  Name Primary?  . Left-sided low back pain with left-sided sciatica Yes  . Knee pain, right   . Essential hypertension   . Trochanteric bursitis of left hip     PLAN Call if any problems.  Precautions discussed.  Continue current medications.   Return to clinic 1 month   I will call in prednisone for her today.  Electronically Signed Sanjuana Kava, MD 7/12/20179:34 AM

## 2015-08-13 ENCOUNTER — Ambulatory Visit (INDEPENDENT_AMBULATORY_CARE_PROVIDER_SITE_OTHER): Payer: Medicare Other | Admitting: *Deleted

## 2015-08-13 DIAGNOSIS — Z5181 Encounter for therapeutic drug level monitoring: Secondary | ICD-10-CM

## 2015-08-13 DIAGNOSIS — I482 Chronic atrial fibrillation, unspecified: Secondary | ICD-10-CM

## 2015-08-13 LAB — POCT INR: INR: 4.6

## 2015-08-14 DIAGNOSIS — I10 Essential (primary) hypertension: Secondary | ICD-10-CM | POA: Diagnosis not present

## 2015-08-14 DIAGNOSIS — E782 Mixed hyperlipidemia: Secondary | ICD-10-CM | POA: Diagnosis not present

## 2015-08-16 DIAGNOSIS — Z8572 Personal history of non-Hodgkin lymphomas: Secondary | ICD-10-CM | POA: Diagnosis not present

## 2015-08-16 DIAGNOSIS — I10 Essential (primary) hypertension: Secondary | ICD-10-CM | POA: Diagnosis not present

## 2015-08-16 DIAGNOSIS — I482 Chronic atrial fibrillation: Secondary | ICD-10-CM | POA: Diagnosis not present

## 2015-08-16 DIAGNOSIS — E782 Mixed hyperlipidemia: Secondary | ICD-10-CM | POA: Diagnosis not present

## 2015-08-27 ENCOUNTER — Ambulatory Visit (INDEPENDENT_AMBULATORY_CARE_PROVIDER_SITE_OTHER): Payer: Medicare Other | Admitting: *Deleted

## 2015-08-27 DIAGNOSIS — Z5181 Encounter for therapeutic drug level monitoring: Secondary | ICD-10-CM

## 2015-08-27 DIAGNOSIS — I482 Chronic atrial fibrillation, unspecified: Secondary | ICD-10-CM

## 2015-08-27 LAB — POCT INR: INR: 3.2

## 2015-08-31 ENCOUNTER — Telehealth: Payer: Self-pay | Admitting: *Deleted

## 2015-08-31 MED ORDER — WARFARIN SODIUM 5 MG PO TABS: ORAL_TABLET | ORAL | 3 refills | Status: DC

## 2015-08-31 NOTE — Telephone Encounter (Signed)
Warfarin refill sent to pharmacy as requested.

## 2015-09-04 ENCOUNTER — Encounter (HOSPITAL_COMMUNITY): Payer: Medicare Other | Attending: Hematology and Oncology | Admitting: Oncology

## 2015-09-04 ENCOUNTER — Encounter (HOSPITAL_COMMUNITY): Payer: Medicare Other

## 2015-09-04 VITALS — BP 142/91 | HR 75 | Temp 98.2°F | Resp 18 | Wt 109.0 lb

## 2015-09-04 DIAGNOSIS — C8519 Unspecified B-cell lymphoma, extranodal and solid organ sites: Secondary | ICD-10-CM | POA: Insufficient documentation

## 2015-09-04 DIAGNOSIS — C8580 Other specified types of non-Hodgkin lymphoma, unspecified site: Secondary | ICD-10-CM

## 2015-09-04 DIAGNOSIS — Z8572 Personal history of non-Hodgkin lymphomas: Secondary | ICD-10-CM

## 2015-09-04 DIAGNOSIS — F329 Major depressive disorder, single episode, unspecified: Secondary | ICD-10-CM | POA: Insufficient documentation

## 2015-09-04 DIAGNOSIS — Z9079 Acquired absence of other genital organ(s): Secondary | ICD-10-CM | POA: Insufficient documentation

## 2015-09-04 DIAGNOSIS — I482 Chronic atrial fibrillation: Secondary | ICD-10-CM | POA: Insufficient documentation

## 2015-09-04 DIAGNOSIS — Z980 Intestinal bypass and anastomosis status: Secondary | ICD-10-CM | POA: Diagnosis not present

## 2015-09-04 DIAGNOSIS — Z0000 Encounter for general adult medical examination without abnormal findings: Secondary | ICD-10-CM

## 2015-09-04 DIAGNOSIS — I10 Essential (primary) hypertension: Secondary | ICD-10-CM | POA: Insufficient documentation

## 2015-09-04 DIAGNOSIS — F17210 Nicotine dependence, cigarettes, uncomplicated: Secondary | ICD-10-CM | POA: Diagnosis not present

## 2015-09-04 DIAGNOSIS — J449 Chronic obstructive pulmonary disease, unspecified: Secondary | ICD-10-CM | POA: Diagnosis not present

## 2015-09-04 DIAGNOSIS — Z90710 Acquired absence of both cervix and uterus: Secondary | ICD-10-CM | POA: Diagnosis not present

## 2015-09-04 DIAGNOSIS — Z7901 Long term (current) use of anticoagulants: Secondary | ICD-10-CM | POA: Diagnosis not present

## 2015-09-04 DIAGNOSIS — Z9089 Acquired absence of other organs: Secondary | ICD-10-CM | POA: Diagnosis not present

## 2015-09-04 DIAGNOSIS — M47817 Spondylosis without myelopathy or radiculopathy, lumbosacral region: Secondary | ICD-10-CM | POA: Insufficient documentation

## 2015-09-04 LAB — COMPREHENSIVE METABOLIC PANEL
ALBUMIN: 4.2 g/dL (ref 3.5–5.0)
ALK PHOS: 54 U/L (ref 38–126)
ALT: 23 U/L (ref 14–54)
ANION GAP: 8 (ref 5–15)
AST: 25 U/L (ref 15–41)
BILIRUBIN TOTAL: 0.5 mg/dL (ref 0.3–1.2)
BUN: 11 mg/dL (ref 6–20)
CALCIUM: 8.6 mg/dL — AB (ref 8.9–10.3)
CO2: 28 mmol/L (ref 22–32)
CREATININE: 0.66 mg/dL (ref 0.44–1.00)
Chloride: 102 mmol/L (ref 101–111)
GFR calc Af Amer: >60 mL/min (ref >60)
GFR calc non Af Amer: >60 mL/min (ref >60)
GLUCOSE: 105 mg/dL — AB (ref 65–99)
Potassium: 3.7 mmol/L (ref 3.5–5.1)
SODIUM: 138 mmol/L (ref 135–145)
TOTAL PROTEIN: 7.3 g/dL (ref 6.5–8.1)

## 2015-09-04 LAB — CBC WITH DIFFERENTIAL/PLATELET
BASOS PCT: 0 %
Basophils Absolute: 0 10*3/uL (ref 0.0–0.1)
EOS ABS: 0.2 10*3/uL (ref 0.0–0.7)
Eosinophils Relative: 2 %
HEMATOCRIT: 42.6 % (ref 36.0–46.0)
HEMOGLOBIN: 14.1 g/dL (ref 12.0–15.0)
LYMPHS ABS: 3.8 10*3/uL (ref 0.7–4.0)
Lymphocytes Relative: 34 %
MCH: 31 pg (ref 26.0–34.0)
MCHC: 33.1 g/dL (ref 30.0–36.0)
MCV: 93.6 fL (ref 78.0–100.0)
MONOS PCT: 9 %
Monocytes Absolute: 1 10*3/uL (ref 0.1–1.0)
NEUTROS ABS: 6.2 10*3/uL (ref 1.7–7.7)
NEUTROS PCT: 55 %
Platelets: 339 10*3/uL (ref 150–400)
RBC: 4.55 MIL/uL (ref 3.87–5.11)
RDW: 14.3 % (ref 11.5–15.5)
WBC: 11.2 10*3/uL — AB (ref 4.0–10.5)

## 2015-09-04 LAB — SEDIMENTATION RATE: Sed Rate: 19 mm/hr (ref 0–22)

## 2015-09-04 LAB — LACTATE DEHYDROGENASE: LDH: 158 U/L (ref 98–192)

## 2015-09-04 LAB — C-REACTIVE PROTEIN: CRP: 0.8 mg/dL (ref <1.0)

## 2015-09-04 MED ORDER — ALPRAZOLAM 1 MG PO TABS
1.0000 mg | ORAL_TABLET | Freq: Three times a day (TID) | ORAL | 0 refills | Status: AC | PRN
Start: 2015-09-04 — End: ?

## 2015-09-04 NOTE — Assessment & Plan Note (Signed)
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 S/P 2 cycles of BR (10/5- 11/29/2013) with progression of disease documented on 12/19/2013 PET scan requiring a change in therapy to R-CHOP x 3 cycles (01/02/2014- 02/13/2014) with complete response on PET on 02/06/2014 followed by radiation therapy, finishing all therapy on 04/12/2014.  Oncology history is up-to-date.  Labs today as ordered: CBC diff, CMET, LDH, B2M.  I personally reviewed and went over laboratory results with the patient.  The results are noted within this dictation.  Labs in 3 months: CBC diff, CMET, LDH, B2M, CRP, ESR.  I personally reviewed and went over radiographic studies with the patient.  The results are noted within this dictation.  Next mammogram is due in Sept 2017.  Order is placed for mammogram.  She notes a recent diagnosis in her sister of what sounds like Ovarian cancer.  The patient learned of the news today.  This has exacerbated her anxiety above baseline.  Her PCP has been managing her Xanax.  She got it last filled on 08/03/2015 by Pablo Lawrence, NP (Dr. [facility] office).  I have contacted the patient's pharmacy to confirm the aforementioned information.  A refill request has been sent to her PCP.  The patient is out of her Xanax.  I have agreed to provide a 1 time #30 tablets until she is able to get refills from her PCP.  We will not prescribe moving forward.  She is thankful of my help with this matter.  Return in 3 months for follow-up.
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
## 2015-09-04 NOTE — Patient Instructions (Signed)
[place] at [facility] Discharge Instructions  RECOMMENDATIONS MADE BY THE CONSULTANT AND ANY TEST RESULTS WILL BE SENT TO YOUR REFERRING PHYSICIAN.  You were seen by [name] today. Mammogram September 2017 Follow up with Labs in 3 months Prescription for Xanax given   Thank you for choosing [place] at [facility] to provide your oncology and hematology care.  To afford each patient quality time with our provider, please arrive at least 15 minutes before your scheduled appointment time.   Beginning January 23rd 2017 lab work for the [facility] will be done in the  Main lab at [facility] on 1st floor. If you have a lab appointment with the [place] please come in thru the  Main Entrance and check in at the main information desk  You need to re-schedule your appointment should you arrive 10 or more minutes late.  We strive to give you quality time with our providers, and arriving late affects you and other patients whose appointments are after yours.  Also, if you no show three or more times for appointments you may be dismissed from the clinic at the providers discretion.     Again, thank you for choosing [facility].  Our hope is that these requests will decrease the amount of time that you wait before being seen by our physicians.       _____________________________________________________________  Should you have questions after your visit to [facility], please contact our office at [phone] between the hours of 8:30 a.m. and 4:30 p.m.  Voicemails left after 4:30 p.m. will not be returned until the following business day.  For prescription refill requests, have your pharmacy contact our office.         Resources For Cancer Patients and their Caregivers ? American Cancer Society: Can assist with transportation, wigs, general needs, runs Look Good Feel Better.        934-545-7031 ? Cancer  Care: Provides financial assistance, online support groups, medication/co-pay assistance.  1-800-813-HOPE 636-441-8660) ? [place] Assists [place] cancer patients and their families through emotional , educational and financial support.  812-180-7234 ? [place] DSS Where to apply for food stamps, Medicaid and utility assistance. (715)045-7914 ? [facility]: Transportation to medical appointments. (606)848-5474 ? Social Security Administration: May apply for disability if have a Stage IV cancer. 587-706-4764 580-444-5107 ? [employer], Disability and Transit Services: Assists with nutrition, care and transit needs. [place] Support Programs: '@10RELATIVEDAYS'$ @ > Cancer Support Group  2nd Tuesday of the month 1pm-2pm, Journey Room  > Creative Journey  3rd Tuesday of the month 1130am-1pm, Journey Room  > Look Good Feel Better  1st Wednesday of the month 10am-12 noon, Journey Room (Call [place] to register [phone])

## 2015-09-05 ENCOUNTER — Encounter: Payer: Self-pay | Admitting: Orthopaedic Surgery

## 2015-09-05 ENCOUNTER — Ambulatory Visit (INDEPENDENT_AMBULATORY_CARE_PROVIDER_SITE_OTHER): Payer: Medicare Other | Admitting: Orthopaedic Surgery

## 2015-09-05 VITALS — BP 149/81 | HR 53 | Temp 97.7°F | Ht 61.0 in | Wt 108.0 lb

## 2015-09-05 DIAGNOSIS — M7062 Trochanteric bursitis, left hip: Secondary | ICD-10-CM | POA: Diagnosis not present

## 2015-09-05 DIAGNOSIS — I10 Essential (primary) hypertension: Secondary | ICD-10-CM

## 2015-09-05 DIAGNOSIS — I482 Chronic atrial fibrillation, unspecified: Secondary | ICD-10-CM

## 2015-09-05 DIAGNOSIS — M25561 Pain in right knee: Secondary | ICD-10-CM | POA: Diagnosis not present

## 2015-09-05 LAB — BETA 2 MICROGLOBULIN, SERUM: BETA 2 MICROGLOBULIN: 1.5 mg/L (ref 0.6–2.4)

## 2015-09-05 NOTE — Progress Notes (Signed)
Patient Marissa Turner, female DOB:03-23-1940, 75 y.o. QQV:[id number]  Chief Complaint  Patient presents with  . Follow-up    Rt knee same, Lt hip better    HPI  Marissa Turner is a 75 y.o. female who has right knee pain chronically and left hip trochanteric pain.  She is much better today.  She is walking well. She has no giving way of the right knee and much less swelling and popping.  She has no new trauma.  The left hip is not hurting.   HPI  Body mass index is 20.41 kg/m.  ROS  Review of Systems  HENT: Negative for congestion.   Respiratory: Negative for cough and shortness of breath.   Cardiovascular: Positive for palpitations. Negative for chest pain and leg swelling.  Endocrine: Positive for cold intolerance.  Musculoskeletal: Positive for arthralgias, back pain, gait problem and joint swelling.  Allergic/Immunologic: Positive for environmental allergies.  Psychiatric/Behavioral: The patient is nervous/anxious.     Past Medical History:  Diagnosis Date  . Atrial fibrillation, chronic ([place]) 2004   left atrial thrombus in 2004; near syncope in 2008; echocardiogram in 2008-normal EF, mild left atrial enlargement, no valvular abnormalities, lipomatous hypertrophy; no aortic atheroma on TEE in 2004  . Cancer (HCC)    LYMPH NODE  . Chronic anticoagulation 2004   warfarin  . Depression   . DJD (degenerative joint disease), lumbosacral    Also hip  . Dysrhythmia    chronic AFib  . Fracture of wrist    Left  . Hypertension    Normal carotid ultrasound in 2008  . Peptic ulcer disease    s/p Billroth II  . PONV (postoperative nausea and vomiting)   . Tobacco abuse    40 pack years; quit attempt in 2012    Past Surgical History:  Procedure Laterality Date  . ABDOMINAL HYSTERECTOMY     w/o oophorectomy  . APPENDECTOMY    . CESAREAN SECTION    . CHOLECYSTECTOMY  2004   [place]   patient denies ever having undergone colonoscopy as  of 2014  . GASTROJEJUNOSTOMY  1984   Billroth II; splenectomy; incidental appendectomy  . KNEE ARTHROSCOPY WITH MEDIAL MENISECTOMY Right 02/26/2015   Procedure: KNEE ARTHROSCOPY WITH MEDIAL MENISECTOMY;  Surgeon: Sanjuana Kava, MD;  Location: [facility];  Service: Orthopedics;  Laterality: Right;  . LYMPH NODE BIOPSY Left 09/15/2013   Procedure: LEFT NECK CERVICAL NODE BIOPSY;  Surgeon: Scherry Ran, MD;  Location: [facility];  Service: General;  Laterality: Left;  . PORT-A-CATH REMOVAL Right 07/12/2014   Procedure: REMOVAL PORT-A-CATH;  Surgeon: Aviva Signs Md, MD;  Location: [facility];  Service: General;  Laterality: Right;  . PORTACATH PLACEMENT Right 10/21/2013   Procedure: ATTEMPTED INSERTION PORT-A-CATH;  Surgeon: Scherry Ran, MD;  Location: [facility];  Service: General;  Laterality: Right;  . SPLENECTOMY, TOTAL    . TONSILLECTOMY      Family History  Problem Relation Age of Onset  . Heart attack Mother     also brother  . Breast cancer Sister   . Heart attack Brother   . Breast cancer Sister     x2  . Cirrhosis Sister   . Cancer Brother     Gastric carcinoma; also father    Social History Social History  Substance Use Topics  . Smoking status: Current Every Day Smoker    Packs/day: 0.25    Years: 55.00    Types: Cigarettes  .  Smokeless tobacco: Not on file     Comment: Quit attempt in 2012  . Alcohol use No    Allergies  Allergen Reactions  . Diphenhydramine Hcl Other (See Comments)    hallucinations   . Estrogens Other (See Comments)    edema  . Penicillins Other (See Comments)    Break out in whelps.  . Codeine Swelling and Rash    Current Outpatient Prescriptions  Medication Sig Dispense Refill  . acetaminophen (TYLENOL) 500 MG tablet Take 1,000 mg by mouth daily as needed for headache.    . ALPRAZolam (XANAX) 1 MG tablet Take 1 tablet (1 mg total) by mouth 3 (three) times daily as needed for anxiety. 30 tablet 0  . CARTIA XT 120 MG 24 hr capsule     .  citalopram (CELEXA) 20 MG tablet Take 1 tablet (20 mg total) by mouth daily. 30 tablet 3  . digoxin (LANOXIN) 0.125 MG tablet Take 125 mcg by mouth daily.     [name] diltiazem (DILACOR XR) 120 MG 24 hr capsule Take 120 mg by mouth daily.    [name] HYDROcodone-acetaminophen (NORCO/VICODIN) 5-325 MG tablet Take 1 tablet by mouth every 4 (four) hours as needed for moderate pain (Must last 30 days.  Do not take and drive a car or use machinery.). 120 tablet 0  . metoprolol (TOPROL-XL) 25 MG 24 hr tablet Take 12.5 mg by mouth 2 (two) times daily.     . Prenatal Vit-Fe Fumarate-FA (PRENATAL PO) Take 1 tablet by mouth daily.    [name] warfarin (COUMADIN) 5 MG tablet Take as directed by [facility] 30 tablet 3   No current facility-administered medications for this visit.      Physical Exam  Blood pressure (!) 149/81, pulse (!) 53, temperature 97.7 F (36.5 C), height '5\' 1"'$  (1.549 m), weight 108 lb (49 kg).  Constitutional: overall normal hygiene, normal nutrition, well developed, normal grooming, normal body habitus. Assistive device:none  Musculoskeletal: gait and station Limp none, muscle tone and strength are normal, no tremors or atrophy is present.  .  Neurological: coordination overall normal.  Deep tendon reflex/nerve stretch intact.  Sensation normal.  Cranial nerves II-XII intact.   Skin:   normal overall no scars, lesions, ulcers or rashes. No psoriasis.  Psychiatric: Alert and oriented x 3.  Recent memory intact, remote memory unclear.  Normal mood and affect. Well groomed.  Good eye contact.  Cardiovascular: overall no swelling, no varicosities, no edema bilaterally, normal temperatures of the legs and arms, no clubbing, cyanosis and good capillary refill.  Lymphatic: palpation is normal.  The right lower extremity is examined:  Inspection:  Thigh:  Non-tender and no defects  Knee does not have swelling 0 effusion.                        Joint tenderness is present                         Patient is tender over the medial joint line  Lower Leg:  Has normal appearance and no tenderness or defects  Ankle:  Non-tender and no defects  Foot:  Non-tender and no defects Range of Motion:  Knee:  Range of motion is: 0-115                        Crepitus is  present  Ankle:  Range of motion is normal. Strength  and Tone:  The right lower extremity has normal strength and tone. Stability:  Knee:  The knee is stable.  Ankle:  The ankle is stable.    The patient has been educated about the nature of the problem(s) and counseled on treatment options.  The patient appeared to understand what I have discussed and is in agreement with it.  Encounter Diagnoses  Name Primary?  . Knee pain, right Yes  . Trochanteric bursitis of left hip   . Essential hypertension   . Atrial fibrillation, chronic ([place])     PLAN Call if any problems.  Precautions discussed.  Continue current medications.   Return to clinic 2 months   Electronically Signed Sanjuana Kava, MD 8/9/20179:49 AM

## 2015-09-17 ENCOUNTER — Ambulatory Visit (INDEPENDENT_AMBULATORY_CARE_PROVIDER_SITE_OTHER): Payer: Medicare Other | Admitting: *Deleted

## 2015-09-17 DIAGNOSIS — I482 Chronic atrial fibrillation, unspecified: Secondary | ICD-10-CM

## 2015-09-17 DIAGNOSIS — Z5181 Encounter for therapeutic drug level monitoring: Secondary | ICD-10-CM

## 2015-09-17 LAB — POCT INR: INR: 2.3

## 2015-10-05 ENCOUNTER — Ambulatory Visit (HOSPITAL_COMMUNITY)
Admission: RE | Admit: 2015-10-05 | Discharge: 2015-10-05 | Disposition: A | Discharge status: Home / Self Care | Payer: Medicare Other | Source: Ambulatory Visit | Attending: Oncology | Admitting: Oncology

## 2015-10-05 DIAGNOSIS — Z0000 Encounter for general adult medical examination without abnormal findings: Secondary | ICD-10-CM | POA: Insufficient documentation

## 2015-10-05 DIAGNOSIS — R928 Other abnormal and inconclusive findings on diagnostic imaging of breast: Secondary | ICD-10-CM | POA: Insufficient documentation

## 2015-10-05 DIAGNOSIS — Z1231 Encounter for screening mammogram for malignant neoplasm of breast: Secondary | ICD-10-CM | POA: Diagnosis not present

## 2015-10-09 ENCOUNTER — Other Ambulatory Visit (HOSPITAL_COMMUNITY): Payer: Self-pay | Admitting: Oncology

## 2015-10-09 DIAGNOSIS — R928 Other abnormal and inconclusive findings on diagnostic imaging of breast: Secondary | ICD-10-CM

## 2015-10-11 ENCOUNTER — Other Ambulatory Visit: Payer: Self-pay

## 2015-10-11 ENCOUNTER — Other Ambulatory Visit (HOSPITAL_COMMUNITY): Payer: Self-pay | Admitting: Oncology

## 2015-10-11 ENCOUNTER — Telehealth: Payer: Self-pay | Admitting: Orthopaedic Surgery

## 2015-10-11 DIAGNOSIS — R928 Other abnormal and inconclusive findings on diagnostic imaging of breast: Secondary | ICD-10-CM

## 2015-10-11 MED ORDER — HYDROCODONE-ACETAMINOPHEN 5-325 MG PO TABS: 1.0000 | ORAL_TABLET | Freq: Four times a day (QID) | ORAL | 0 refills | Status: DC | PRN

## 2015-10-11 NOTE — Telephone Encounter (Signed)
Patient requests a refill on Hydrocodone/Acetaminophen (Norco) 5-325 mgs.   Qty  120  Sig: Take 1 tablet by mouth every 4 (four) hours as needed for moderate pain (Must last 30 days. Do not take and drive a car or use machinery.).

## 2015-10-15 ENCOUNTER — Ambulatory Visit (INDEPENDENT_AMBULATORY_CARE_PROVIDER_SITE_OTHER): Payer: Medicare Other | Admitting: *Deleted

## 2015-10-15 DIAGNOSIS — Z5181 Encounter for therapeutic drug level monitoring: Secondary | ICD-10-CM | POA: Diagnosis not present

## 2015-10-15 DIAGNOSIS — I482 Chronic atrial fibrillation, unspecified: Secondary | ICD-10-CM

## 2015-10-15 LAB — POCT INR: INR: 1.7

## 2015-10-16 ENCOUNTER — Ambulatory Visit (HOSPITAL_COMMUNITY)
Admission: RE | Admit: 2015-10-16 | Discharge: 2015-10-16 | Disposition: A | Discharge status: Home / Self Care | Payer: Medicare Other | Source: Ambulatory Visit | Attending: Oncology | Admitting: Oncology

## 2015-10-16 ENCOUNTER — Telehealth: Payer: Self-pay | Admitting: Orthopaedic Surgery

## 2015-10-16 DIAGNOSIS — R922 Inconclusive mammogram: Secondary | ICD-10-CM | POA: Diagnosis not present

## 2015-10-16 DIAGNOSIS — R928 Other abnormal and inconclusive findings on diagnostic imaging of breast: Secondary | ICD-10-CM | POA: Insufficient documentation

## 2015-10-16 NOTE — Telephone Encounter (Signed)
Release her whenever she wants.  I do not have to see her.

## 2015-10-16 NOTE — Telephone Encounter (Signed)
Patient called to request "to be released"; she has signed an authorization form to release her records, per speaking with her attorney.  She is scheduled for a follow up appointment on 11/06/15.  Please advise if patient is to come in for appointment at that time, or sooner if needed, to discuss her request to be released from care.

## 2015-10-19 NOTE — Telephone Encounter (Signed)
Called patient, relayed.

## 2015-10-23 NOTE — Telephone Encounter (Signed)
Patient returned call 10/22/15.

## 2015-11-05 ENCOUNTER — Ambulatory Visit (INDEPENDENT_AMBULATORY_CARE_PROVIDER_SITE_OTHER): Payer: Medicare Other | Admitting: *Deleted

## 2015-11-05 DIAGNOSIS — I482 Chronic atrial fibrillation, unspecified: Secondary | ICD-10-CM

## 2015-11-05 DIAGNOSIS — Z5181 Encounter for therapeutic drug level monitoring: Secondary | ICD-10-CM

## 2015-11-05 LAB — POCT INR: INR: 2.8

## 2015-11-06 ENCOUNTER — Ambulatory Visit: Payer: Medicare Other | Admitting: Orthopaedic Surgery

## 2015-11-14 DIAGNOSIS — Z23 Encounter for immunization: Secondary | ICD-10-CM | POA: Diagnosis not present

## 2015-11-20 ENCOUNTER — Other Ambulatory Visit (HOSPITAL_COMMUNITY): Payer: Self-pay | Admitting: Emergency Medicine

## 2015-11-20 DIAGNOSIS — F329 Major depressive disorder, single episode, unspecified: Secondary | ICD-10-CM

## 2015-11-20 DIAGNOSIS — F32A Depression, unspecified: Secondary | ICD-10-CM

## 2015-11-20 MED ORDER — CITALOPRAM HYDROBROMIDE 20 MG PO TABS: 20.0000 mg | ORAL_TABLET | Freq: Every day | ORAL | 3 refills | Status: DC

## 2015-11-20 NOTE — Progress Notes (Signed)
Refilled celexa

## 2015-12-03 ENCOUNTER — Ambulatory Visit (INDEPENDENT_AMBULATORY_CARE_PROVIDER_SITE_OTHER): Payer: Medicare Other | Admitting: *Deleted

## 2015-12-03 DIAGNOSIS — I482 Chronic atrial fibrillation, unspecified: Secondary | ICD-10-CM

## 2015-12-03 DIAGNOSIS — Z5181 Encounter for therapeutic drug level monitoring: Secondary | ICD-10-CM

## 2015-12-03 LAB — POCT INR: INR: 2.5

## 2015-12-04 ENCOUNTER — Encounter (HOSPITAL_COMMUNITY): Payer: Medicare Other | Attending: Hematology & Oncology | Admitting: Hematology & Oncology

## 2015-12-04 ENCOUNTER — Encounter (HOSPITAL_COMMUNITY): Payer: Medicare Other

## 2015-12-04 ENCOUNTER — Encounter (HOSPITAL_COMMUNITY): Payer: Self-pay | Admitting: Hematology & Oncology

## 2015-12-04 VITALS — BP 123/91 | HR 76 | Temp 98.5°F | Resp 20 | Wt 109.0 lb

## 2015-12-04 DIAGNOSIS — C8331 Diffuse large B-cell lymphoma, lymph nodes of head, face, and neck: Secondary | ICD-10-CM | POA: Diagnosis not present

## 2015-12-04 DIAGNOSIS — C8580 Other specified types of non-Hodgkin lymphoma, unspecified site: Secondary | ICD-10-CM | POA: Diagnosis not present

## 2015-12-04 DIAGNOSIS — Z720 Tobacco use: Secondary | ICD-10-CM | POA: Diagnosis not present

## 2015-12-04 DIAGNOSIS — F418 Other specified anxiety disorders: Secondary | ICD-10-CM | POA: Diagnosis not present

## 2015-12-04 DIAGNOSIS — F419 Anxiety disorder, unspecified: Secondary | ICD-10-CM

## 2015-12-04 LAB — CBC WITH DIFFERENTIAL/PLATELET
Basophils Absolute: 0.1 10*3/uL (ref 0.0–0.1)
Basophils Relative: 1 %
Eosinophils Absolute: 0.5 10*3/uL (ref 0.0–0.7)
Eosinophils Relative: 5 %
HEMATOCRIT: 41.8 % (ref 36.0–46.0)
HEMOGLOBIN: 13.9 g/dL (ref 12.0–15.0)
LYMPHS ABS: 3.7 10*3/uL (ref 0.7–4.0)
Lymphocytes Relative: 37 %
MCH: 30.8 pg (ref 26.0–34.0)
MCHC: 33.3 g/dL (ref 30.0–36.0)
MCV: 92.5 fL (ref 78.0–100.0)
MONO ABS: 0.8 10*3/uL (ref 0.1–1.0)
MONOS PCT: 9 %
NEUTROS ABS: 4.8 10*3/uL (ref 1.7–7.7)
NEUTROS PCT: 48 %
Platelets: 310 10*3/uL (ref 150–400)
RBC: 4.52 MIL/uL (ref 3.87–5.11)
RDW: 14 % (ref 11.5–15.5)
WBC: 9.9 10*3/uL (ref 4.0–10.5)

## 2015-12-04 LAB — LACTATE DEHYDROGENASE: LDH: 148 U/L (ref 98–192)

## 2015-12-04 LAB — SEDIMENTATION RATE: Sed Rate: 20 mm/hr (ref 0–22)

## 2015-12-04 LAB — COMPREHENSIVE METABOLIC PANEL
ALBUMIN: 3.9 g/dL (ref 3.5–5.0)
ALK PHOS: 55 U/L (ref 38–126)
ALT: 16 U/L (ref 14–54)
AST: 23 U/L (ref 15–41)
Anion gap: 7 (ref 5–15)
BILIRUBIN TOTAL: 0.4 mg/dL (ref 0.3–1.2)
BUN: 14 mg/dL (ref 6–20)
CO2: 30 mmol/L (ref 22–32)
CREATININE: 0.67 mg/dL (ref 0.44–1.00)
Calcium: 8.5 mg/dL — ABNORMAL LOW (ref 8.9–10.3)
Chloride: 101 mmol/L (ref 101–111)
GFR calc Af Amer: >60 mL/min (ref >60)
GFR calc non Af Amer: >60 mL/min (ref >60)
GLUCOSE: 116 mg/dL — AB (ref 65–99)
Potassium: 3.6 mmol/L (ref 3.5–5.1)
Sodium: 138 mmol/L (ref 135–145)
TOTAL PROTEIN: 7 g/dL (ref 6.5–8.1)

## 2015-12-04 LAB — C-REACTIVE PROTEIN: CRP: <0.8 mg/dL (ref <1.0)

## 2015-12-04 NOTE — Assessment & Plan Note (Addendum)
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 S/P 2 cycles of BR (10/5- 11/29/2013) with progression of disease documented on 12/19/2013 PET scan requiring a change in therapy to R-CHOP x 3 cycles (01/02/2014- 02/13/2014) with complete response on PET on 02/06/2014 followed by radiation therapy, finishing all therapy on 04/12/2014.  Oncology history is up-to-date.  Labs today as ordered: CBC diff, CMET, LDH, B2M.  I personally reviewed and went over laboratory results with the patient.  The results are noted within this dictation.  Labs in 3 months: CBC diff, CMET, LDH, B2M, CRP, ESR.  I personally reviewed and went over radiographic studies with the patient.  The results are noted within this dictation.  Next mammogram is due in Sept 2017.  Order is placed for mammogram.  NCCN guidelines recommends the follow surveillance for Stage I, II for those who asertain a complete response in the first-line treatment setting (3.2017):  A. H&P every 3-6 months for 5 years, then yearly or as clinically indicated.  B. Labs every 3-6 months for 5 years and then annually or as clinically indicated.  C. Imaging only as indicated. For those ascertaining a complete response in the Stage III, IV setting in first-line treatment setting:  A. H&P every 3-6 months for 5 years, then yearly or as clinically indicated.  B. Labs every 3-6 months for 5 years and then annually or as clinically indicated.  C. CT C/A/P with contrast no more than every 6 months for 2 years after  completion of treatment; then only as indicated.   She notes a recent diagnosis in her sister of what sounds like Ovarian cancer.  The patient learned of the news today.  This has exacerbated her anxiety above baseline.  Her PCP has been managing her Xanax.  She got it last filled on 08/03/2015 by Pablo Lawrence, NP (Dr. Juel Burrow office).  I have contacted the patient's pharmacy to confirm the aforementioned  information.  A refill request has been sent to her PCP.  The patient is out of her Xanax.  I have agreed to provide a 1 time #30 tablets until she is able to get refills from her PCP.  We will not prescribe moving forward.  She is thankful of my help with this matter.  Return in 3 months for follow-up.
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
## 2015-12-04 NOTE — Assessment & Plan Note (Signed)
Continues to smoke about 1/2 ppd. Smoking cessation was addressed. We discussed methods to quit smoking including classes, wellbutrin and chantix.

## 2015-12-04 NOTE — Patient Instructions (Signed)
[place] at [facility] Discharge Instructions  RECOMMENDATIONS MADE BY THE CONSULTANT AND ANY TEST RESULTS WILL BE SENT TO YOUR REFERRING PHYSICIAN.  Exam and discussion today with Dr. Whitney Muse. Return in 4 months for lab work and office visit. Call the clinic prior to your next appointment should you have any questions or concerns.   Thank you for choosing [place] at [facility] to provide your oncology and hematology care.  To afford each patient quality time with our provider, please arrive at least 15 minutes before your scheduled appointment time.   Beginning January 23rd 2017 lab work for the [facility] will be done in the  Main lab at [facility] on 1st floor. If you have a lab appointment with the [facility] please come in thru the  Main Entrance and check in at the main information desk  You need to re-schedule your appointment should you arrive 10 or more minutes late.  We strive to give you quality time with our providers, and arriving late affects you and other patients whose appointments are after yours.  Also, if you no show three or more times for appointments you may be dismissed from the clinic at the providers discretion.     Again, thank you for choosing [facility].  Our hope is that these requests will decrease the amount of time that you wait before being seen by our physicians.       _____________________________________________________________  Should you have questions after your visit to [facility], please contact our office at [phone] between the hours of 8:30 a.m. and 4:30 p.m.  Voicemails left after 4:30 p.m. will not be returned until the following business day.  For prescription refill requests, have your pharmacy contact our office.         Resources For Cancer Patients and their Caregivers ? American Cancer Society: Can assist with transportation, wigs, general  needs, runs Look Good Feel Better.        (757)573-1271 ? Cancer Care: Provides financial assistance, online support groups, medication/co-pay assistance.  1-800-813-HOPE (234)431-1378) ? [facility] Assists [place] cancer patients and their families through emotional , educational and financial support.  502-044-2792 ? [place] DSS Where to apply for food stamps, Medicaid and utility assistance. 8182690615 ? [facility]: Transportation to medical appointments. 585-501-5765 ? Social Security Administration: May apply for disability if have a Stage IV cancer. 614-466-4915 (843)267-2779 ? [employer], Disability and Transit Services: Assists with nutrition, care and transit needs. [place] Support Programs: '@10RELATIVEDAYS'$ @ > Cancer Support Group  2nd Tuesday of the month 1pm-2pm, Journey Room  > Creative Journey  3rd Tuesday of the month 1130am-1pm, Journey Room  > Look Good Feel Better  1st Wednesday of the month 10am-12 noon, Journey Room (Call [facility] to register [phone])

## 2015-12-05 LAB — BETA 2 MICROGLOBULIN, SERUM: BETA 2 MICROGLOBULIN: 1.7 mg/L (ref 0.6–2.4)

## 2015-12-31 ENCOUNTER — Ambulatory Visit (INDEPENDENT_AMBULATORY_CARE_PROVIDER_SITE_OTHER): Payer: Medicare Other | Admitting: *Deleted

## 2015-12-31 DIAGNOSIS — I482 Chronic atrial fibrillation, unspecified: Secondary | ICD-10-CM

## 2015-12-31 DIAGNOSIS — Z5181 Encounter for therapeutic drug level monitoring: Secondary | ICD-10-CM

## 2015-12-31 LAB — POCT INR: INR: 2.2

## 2016-01-23 ENCOUNTER — Other Ambulatory Visit: Payer: Self-pay | Admitting: *Deleted

## 2016-01-23 ENCOUNTER — Other Ambulatory Visit: Payer: Self-pay | Admitting: Nurse Practitioner

## 2016-01-23 MED ORDER — WARFARIN SODIUM 5 MG PO TABS: ORAL_TABLET | ORAL | 4 refills | Status: DC

## 2016-01-30 ENCOUNTER — Ambulatory Visit (INDEPENDENT_AMBULATORY_CARE_PROVIDER_SITE_OTHER): Payer: Medicare Other | Admitting: *Deleted

## 2016-01-30 DIAGNOSIS — Z5181 Encounter for therapeutic drug level monitoring: Secondary | ICD-10-CM

## 2016-01-30 DIAGNOSIS — I482 Chronic atrial fibrillation, unspecified: Secondary | ICD-10-CM

## 2016-01-30 LAB — POCT INR: INR: 3.3

## 2016-02-18 DIAGNOSIS — I4891 Unspecified atrial fibrillation: Secondary | ICD-10-CM | POA: Diagnosis not present

## 2016-02-18 DIAGNOSIS — M25569 Pain in unspecified knee: Secondary | ICD-10-CM | POA: Diagnosis not present

## 2016-02-18 DIAGNOSIS — F411 Generalized anxiety disorder: Secondary | ICD-10-CM | POA: Diagnosis not present

## 2016-02-18 DIAGNOSIS — I482 Chronic atrial fibrillation: Secondary | ICD-10-CM | POA: Diagnosis not present

## 2016-02-18 DIAGNOSIS — J0100 Acute maxillary sinusitis, unspecified: Secondary | ICD-10-CM | POA: Diagnosis not present

## 2016-02-18 DIAGNOSIS — I10 Essential (primary) hypertension: Secondary | ICD-10-CM | POA: Diagnosis not present

## 2016-02-18 DIAGNOSIS — N921 Excessive and frequent menstruation with irregular cycle: Secondary | ICD-10-CM | POA: Diagnosis not present

## 2016-02-18 DIAGNOSIS — M159 Polyosteoarthritis, unspecified: Secondary | ICD-10-CM | POA: Diagnosis not present

## 2016-02-18 DIAGNOSIS — E782 Mixed hyperlipidemia: Secondary | ICD-10-CM | POA: Diagnosis not present

## 2016-02-18 DIAGNOSIS — Z8572 Personal history of non-Hodgkin lymphomas: Secondary | ICD-10-CM | POA: Diagnosis not present

## 2016-02-18 DIAGNOSIS — R7301 Impaired fasting glucose: Secondary | ICD-10-CM | POA: Diagnosis not present

## 2016-02-20 ENCOUNTER — Ambulatory Visit (INDEPENDENT_AMBULATORY_CARE_PROVIDER_SITE_OTHER): Payer: Medicare Other | Admitting: *Deleted

## 2016-02-20 DIAGNOSIS — I482 Chronic atrial fibrillation, unspecified: Secondary | ICD-10-CM

## 2016-02-20 DIAGNOSIS — Z5181 Encounter for therapeutic drug level monitoring: Secondary | ICD-10-CM

## 2016-02-20 LAB — POCT INR: INR: 2.1

## 2016-03-19 ENCOUNTER — Ambulatory Visit (INDEPENDENT_AMBULATORY_CARE_PROVIDER_SITE_OTHER): Payer: Medicare Other | Admitting: *Deleted

## 2016-03-19 ENCOUNTER — Other Ambulatory Visit (HOSPITAL_COMMUNITY): Payer: Self-pay

## 2016-03-19 ENCOUNTER — Other Ambulatory Visit (HOSPITAL_COMMUNITY): Payer: Self-pay | Admitting: Oncology

## 2016-03-19 DIAGNOSIS — I482 Chronic atrial fibrillation, unspecified: Secondary | ICD-10-CM

## 2016-03-19 DIAGNOSIS — Z5181 Encounter for therapeutic drug level monitoring: Secondary | ICD-10-CM

## 2016-03-19 DIAGNOSIS — C8580 Other specified types of non-Hodgkin lymphoma, unspecified site: Secondary | ICD-10-CM

## 2016-03-19 LAB — POCT INR: INR: 2.3

## 2016-03-19 MED ORDER — CITALOPRAM HYDROBROMIDE 20 MG PO TABS: 20.0000 mg | ORAL_TABLET | Freq: Every day | ORAL | 3 refills | Status: DC

## 2016-03-19 NOTE — Telephone Encounter (Signed)
Received refill request from patients pharmacy for Celexa. Reviewed with PA, chart checked and refilled.

## 2016-04-03 ENCOUNTER — Ambulatory Visit (HOSPITAL_COMMUNITY): Payer: Medicare Other | Admitting: Oncology

## 2016-04-03 ENCOUNTER — Other Ambulatory Visit (HOSPITAL_COMMUNITY): Payer: Medicare Other

## 2016-04-07 ENCOUNTER — Encounter (HOSPITAL_COMMUNITY): Payer: Medicare Other

## 2016-04-07 ENCOUNTER — Encounter (HOSPITAL_COMMUNITY): Payer: Medicare Other | Attending: Oncology | Admitting: Oncology

## 2016-04-07 ENCOUNTER — Encounter (HOSPITAL_COMMUNITY): Payer: Self-pay | Admitting: Oncology

## 2016-04-07 DIAGNOSIS — Z8572 Personal history of non-Hodgkin lymphomas: Secondary | ICD-10-CM | POA: Diagnosis not present

## 2016-04-07 DIAGNOSIS — F419 Anxiety disorder, unspecified: Secondary | ICD-10-CM

## 2016-04-07 DIAGNOSIS — C8580 Other specified types of non-Hodgkin lymphoma, unspecified site: Secondary | ICD-10-CM

## 2016-04-07 DIAGNOSIS — Z716 Tobacco abuse counseling: Secondary | ICD-10-CM

## 2016-04-07 LAB — CBC WITH DIFFERENTIAL/PLATELET
BASOS ABS: 0.1 10*3/uL (ref 0.0–0.1)
BASOS PCT: 1 %
EOS ABS: 0.8 10*3/uL — AB (ref 0.0–0.7)
Eosinophils Relative: 9 %
HEMATOCRIT: 42 % (ref 36.0–46.0)
HEMOGLOBIN: 13.8 g/dL (ref 12.0–15.0)
Lymphocytes Relative: 27 %
Lymphs Abs: 2.3 10*3/uL (ref 0.7–4.0)
MCH: 30.1 pg (ref 26.0–34.0)
MCHC: 32.9 g/dL (ref 30.0–36.0)
MCV: 91.7 fL (ref 78.0–100.0)
Monocytes Absolute: 0.7 10*3/uL (ref 0.1–1.0)
Monocytes Relative: 8 %
NEUTROS ABS: 4.7 10*3/uL (ref 1.7–7.7)
NEUTROS PCT: 55 %
Platelets: 331 10*3/uL (ref 150–400)
RBC: 4.58 MIL/uL (ref 3.87–5.11)
RDW: 14.7 % (ref 11.5–15.5)
WBC: 8.6 10*3/uL (ref 4.0–10.5)

## 2016-04-07 LAB — COMPREHENSIVE METABOLIC PANEL
ALBUMIN: 4.1 g/dL (ref 3.5–5.0)
ALK PHOS: 59 U/L (ref 38–126)
ALT: 16 U/L (ref 14–54)
ANION GAP: 9 (ref 5–15)
AST: 22 U/L (ref 15–41)
BILIRUBIN TOTAL: 0.2 mg/dL — AB (ref 0.3–1.2)
BUN: 12 mg/dL (ref 6–20)
CALCIUM: 8.9 mg/dL (ref 8.9–10.3)
CO2: 29 mmol/L (ref 22–32)
Chloride: 101 mmol/L (ref 101–111)
Creatinine, Ser: 0.51 mg/dL (ref 0.44–1.00)
GFR calc Af Amer: >60 mL/min (ref >60)
GFR calc non Af Amer: >60 mL/min (ref >60)
GLUCOSE: 112 mg/dL — AB (ref 65–99)
Potassium: 3.3 mmol/L — ABNORMAL LOW (ref 3.5–5.1)
SODIUM: 139 mmol/L (ref 135–145)
TOTAL PROTEIN: 7.3 g/dL (ref 6.5–8.1)

## 2016-04-07 LAB — LACTATE DEHYDROGENASE: LDH: 168 U/L (ref 98–192)

## 2016-04-07 NOTE — Progress Notes (Signed)
Marissa Neighbors, MD [place] [address]  Lymphoma malignant, large cell [facility]) - Plan: CBC with Differential, Comprehensive metabolic panel, Lactate dehydrogenase, Beta 2 microglobuline, serum  CURRENT THERAPY: Surveillance per NCCN guidelines  INTERVAL HISTORY: Marissa Turner 76 y.o. 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 S/P 2 cycles of BR (10/5- 11/29/2013) with progression of disease documented on 12/19/2013 PET scan requiring a change in therapy to R-CHOP x 3 cycles (01/02/2014- 02/13/2014) with complete response on PET on 02/06/2014 followed by radiation therapy, finishing all therapy on 04/12/2014.     Lymphoma malignant, large cell ([place])   09/15/2013 Initial Diagnosis    Diagnosis Lymph node, biopsy, cervical left neck - DIFFUSE LARGE B CELL LYMPHOMA.      10/06/2013 PET scan    Hypermetabolic thickening within the right vallecular consists with primary head and neck cancer.  Single enlarged hypermetabolic right level IIa lymph node consistent local nodal metastasis.      10/31/2013 - 11/29/2013 Chemotherapy    Bendamustine/Rituxan x 2 cycles      12/19/2013 Progression    PET- Progression of presumed vallecular lymphoma as evidenced by increased hypermetabolism.      12/20/2013 Treatment Plan Change    Per Dr. Gari Crown, patient has a CD10 negative, non-germinal center non-Hodgkin's lymphoma.  Patient may be a candidate for additional of Revlimid to treatment plan.      12/28/2013 Echocardiogram    MUGA- Normal left ventricular wall motion with calculated ejection fraction of 60%.      01/02/2014 - 02/13/2014 Chemotherapy    R-CHOP with Neulasta support.      02/06/2014 Remission    PET- Resolution of the previously enlarged right-sided station 2 lymph  node in the neck with resolution of the abnormal right vallecular  activity in the neck. Currently no specific in findings  of residual  lymphoma.       - 04/12/2014 Radiation Therapy    Dr. Lisbeth Renshaw      07/12/2014 Procedure    Port removed- Dr. Arnoldo Morale       She is doing very well.  Her appetite is stable.  Weight is stable.  She denies any fevers, chills, night sweats.  She denies any unintentional weight loss.  She also denies any lumps/bumps on her body.  She does show me a right posterior thorax skin tag.  It appears to be fluid-filled.  No erythema.  No tenderness to palpation.  It appears benign in nature.  She also has a scratch on the anterior aspect of her lower leg that does not appear to be infected.  It is healing nicely.  She feels wonderful.  Clinically she looks great.  She continues to smoke cigarettes but appreciates a decrease in her cigarette usage.  Review of Systems  Constitutional: Negative.  Negative for chills, fever and weight loss.  HENT: Negative.   Eyes: Negative.   Respiratory: Negative.  Negative for cough.   Cardiovascular: Negative.  Negative for chest pain.  Gastrointestinal: Negative.  Negative for blood in stool, constipation, diarrhea, melena, nausea and vomiting.  Genitourinary: Negative.   Musculoskeletal: Negative.   Skin: Negative.   Neurological: Negative.  Negative for weakness.  Endo/Heme/Allergies: Negative.   Psychiatric/Behavioral: Negative.     Past Medical History:  Diagnosis Date  . Atrial fibrillation, chronic ([place]) 2004   left atrial thrombus in 2004; near syncope  in 2008; echocardiogram in 2008-normal EF, mild left atrial enlargement, no valvular abnormalities, lipomatous hypertrophy; no aortic atheroma on TEE in 2004  . Cancer ([facility])    LYMPH NODE  . Chronic anticoagulation 2004   warfarin  . Depression   . DJD (degenerative joint disease), lumbosacral    Also hip  . Dysrhythmia    chronic AFib  . Fracture of wrist    Left  . Hypertension    Normal carotid ultrasound in 2008  . Peptic ulcer disease    s/p Billroth II  . PONV  (postoperative nausea and vomiting)   . Tobacco abuse    40 pack years; quit attempt in 2012    Past Surgical History:  Procedure Laterality Date  . ABDOMINAL HYSTERECTOMY     w/o oophorectomy  . APPENDECTOMY    . CESAREAN SECTION    . CHOLECYSTECTOMY  2004   [place]   patient denies ever having undergone colonoscopy as of 2014  . GASTROJEJUNOSTOMY  1984   Billroth II; splenectomy; incidental appendectomy  . KNEE ARTHROSCOPY WITH MEDIAL MENISECTOMY Right 02/26/2015   Procedure: KNEE ARTHROSCOPY WITH MEDIAL MENISECTOMY;  Surgeon: Sanjuana Kava, MD;  Location: [facility];  Service: Orthopedics;  Laterality: Right;  . LYMPH NODE BIOPSY Left 09/15/2013   Procedure: LEFT NECK CERVICAL NODE BIOPSY;  Surgeon: Scherry Ran, MD;  Location: [facility];  Service: General;  Laterality: Left;  . PORT-A-CATH REMOVAL Right 07/12/2014   Procedure: REMOVAL PORT-A-CATH;  Surgeon: Aviva Signs Md, MD;  Location: [facility];  Service: General;  Laterality: Right;  . PORTACATH PLACEMENT Right 10/21/2013   Procedure: ATTEMPTED INSERTION PORT-A-CATH;  Surgeon: Scherry Ran, MD;  Location: [facility];  Service: General;  Laterality: Right;  . SPLENECTOMY, TOTAL    . TONSILLECTOMY      Family History  Problem Relation Age of Onset  . Heart attack Mother     also brother  . Breast cancer Sister   . Heart attack Brother   . Breast cancer Sister     x2  . Cancer Brother     Gastric carcinoma; also father  . Cirrhosis Sister     Social History   Social History  . Marital status: Married    Spouse name: N/A  . Number of children: 2  . Years of education: N/A   Occupational History  . Housecleaning    Social History Main Topics  . Smoking status: Current Every Day Smoker    Packs/day: 0.25    Years: 55.00    Types: Cigarettes  . Smokeless tobacco: Never Used     Comment: Quit attempt in 2012  . Alcohol use No  . Drug use: No  . Sexual activity: Yes    Birth  control/ protection: Surgical   Other Topics Concern  . None   Social History Narrative   Married with one child and one adopted child, 1 stillbirth, and one deceased child at age 81 due to illness   No regular exercise     PHYSICAL EXAMINATION  ECOG PERFORMANCE STATUS: 0 - Asymptomatic  Vitals:   04/07/16 1113  BP: (!) 155/90  Pulse: 70  Resp: 16    GENERAL:alert, healthy, no distress, well nourished, well developed, comfortable, cooperative, smiling and unaccompanied SKIN: skin color, texture, turgor are normal, positive for: right posterior thorax skin lesion, fluid-filled without erythema, measuring about 1 cm in size, raised. HEAD: Normocephalic, No masses, lesions, tenderness or abnormalities EYES:  normal, EOMI, Conjunctiva are pink and non-injected EARS: External ears normal OROPHARYNX:lips, buccal mucosa, and tongue normal and mucous membranes are moist  NECK: supple, no adenopathy, thyroid normal size, non-tender, without nodularity, trachea midline LYMPH:  no palpable lymphadenopathy BREAST:not examined LUNGS: clear to auscultation and percussion HEART: regular rate & rhythm, no murmurs, no gallops, S1 normal and S2 normal ABDOMEN:abdomen soft, non-tender, normal bowel sounds and no masses or organomegaly BACK: Back symmetric, no curvature. EXTREMITIES:less then 2 second capillary refill, no joint deformities, effusion, or inflammation, no edema, no skin discoloration, no clubbing, no cyanosis.  Right lower leg excoriation, erythematous (mild), healing nicely without any discharge.  NEURO: alert & oriented x 3 with fluent speech, no focal motor/sensory deficits, gait normal   LABORATORY DATA: CBC    Component Value Date/Time   WBC 8.6 04/07/2016 0948   RBC 4.58 04/07/2016 0948   HGB 13.8 04/07/2016 0948   HCT 42.0 04/07/2016 0948   PLT 331 04/07/2016 0948   MCV 91.7 04/07/2016 0948   MCH 30.1 04/07/2016 0948   MCHC 32.9 04/07/2016 0948   RDW 14.7 04/07/2016  0948   LYMPHSABS 2.3 04/07/2016 0948   MONOABS 0.7 04/07/2016 0948   EOSABS 0.8 (H) 04/07/2016 0948   BASOSABS 0.1 04/07/2016 0948      Chemistry      Component Value Date/Time   NA 139 04/07/2016 0948   K 3.3 (L) 04/07/2016 0948   CL 101 04/07/2016 0948   CO2 29 04/07/2016 0948   BUN 12 04/07/2016 0948   CREATININE 0.51 04/07/2016 0948   CREATININE 0.65 06/30/2013 0840      Component Value Date/Time   CALCIUM 8.9 04/07/2016 0948   ALKPHOS 59 04/07/2016 0948   AST 22 04/07/2016 0948   ALT 16 04/07/2016 0948   BILITOT 0.2 (L) 04/07/2016 0948        PENDING LABS:   RADIOGRAPHIC STUDIES:  No results found.   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 S/P 2 cycles of BR (10/5- 11/29/2013) with progression of disease documented on 12/19/2013 PET scan requiring a change in therapy to R-CHOP x 3 cycles (01/02/2014- 02/13/2014) with complete response on PET on 02/06/2014 followed by radiation therapy, finishing all therapy on 04/12/2014.  Oncology history is up-to-date.  Labs today as ordered: CBC diff, CMET, LDH.  I personally reviewed and went over laboratory results with the patient.  The results are noted within this dictation.  Labs in 6 months: CBC diff, CMET, LDH, B2M.  Smoking cessation education is provided.  She refuses intervention at this time.  I would consider Wellbutrin given her ongoing anxiety/nervousness as a treatment option for her tobacco abuse.  Return in 6 months for follow-up.   ORDERS PLACED FOR THIS ENCOUNTER: Orders Placed This Encounter  Procedures  . CBC with Differential  . Comprehensive metabolic panel  . Lactate dehydrogenase  . Beta 2 microglobuline, serum    MEDICATIONS PRESCRIBED THIS ENCOUNTER: Meds ordered this encounter  Medications  . Bee Pollen 550 MG CAPS    Sig: Take 2 capsules by mouth 2 (two) times daily.   . Turmeric 500 MG TABS    Sig: Take 1 tablet by mouth 2 (two) times daily.  . fexofenadine (KP FEXOFENADINE HCL) 180 MG tablet    Sig: Take 180 mg by mouth daily.    THERAPY PLAN:  NCCN guidelines recommends the  follow surveillance for Stage I, II for those who asertain a complete response in the first-line treatment setting (3.2017):  A. H&P every 3-6 months for 5 years, then yearly or as clinically indicated.  B. Labs every 3-6 months for 5 years and then annually or as clinically indicated.  C. Imaging only as indicated. For those ascertaining a complete response in the Stage III, IV setting in first-line treatment setting:  A. H&P every 3-6 months for 5 years, then yearly or as clinically indicated.  B. Labs every 3-6 months for 5 years and then annually or as clinically indicated.  C. CT C/A/P with contrast no more than every 6 months for 2 years after  completion of treatment; then only as indicated.   All questions were answered. The patient knows to call the clinic with any problems, questions or concerns. We can certainly see the patient much sooner if necessary.  Patient and plan discussed with Dr. Twana First and she is in agreement with the aforementioned.   This note is electronically signed by: Doy Mince 04/07/2016 2:09 PM

## 2016-04-07 NOTE — Patient Instructions (Signed)
[facility] at [facility] Discharge Instructions  RECOMMENDATIONS MADE BY THE CONSULTANT AND ANY TEST RESULTS WILL BE SENT TO YOUR REFERRING PHYSICIAN.  You were seen today by Tom Kefalas PA-C. Return in 6 months for labs and follow up.   Thank you for choosing [facility] at [facility] to provide your oncology and hematology care.  To afford each patient quality time with our provider, please arrive at least 15 minutes before your scheduled appointment time.    If you have a lab appointment with the Cancer Center please come in thru the  Main Entrance and check in at the main information desk  You need to re-schedule your appointment should you arrive 10 or more minutes late.  We strive to give you quality time with our providers, and arriving late affects you and other patients whose appointments are after yours.  Also, if you no show three or more times for appointments you may be dismissed from the clinic at the providers discretion.     Again, thank you for choosing [facility].  Our hope is that these requests will decrease the amount of time that you wait before being seen by our physicians.       _____________________________________________________________  Should you have questions after your visit to [facility], please contact our office at [phone] between the hours of 8:30 a.m. and 4:30 p.m.  Voicemails left after 4:30 p.m. will not be returned until the following business day.  For prescription refill requests, have your pharmacy contact our office.       Resources For Cancer Patients and their Caregivers ? American Cancer Society: Can assist with transportation, wigs, general needs, runs Look Good Feel Better.        1-888-227-6333 ? Cancer Care: Provides financial assistance, online support groups, medication/co-pay assistance.  1-800-813-HOPE (4673) ? [facility] Assists [place] cancer patients and their families through emotional , educational and financial support.  336-427-4357 ? [place] DSS Where to apply for food stamps, Medicaid and utility assistance. 336-342-1394 ? [facility]: Transportation to medical appointments. 336-347-2287 ? Social Security Administration: May apply for disability if have a Stage IV cancer. 336-342-7796 1-800-772-1213 ? [place] Aging, Disability and Transit Services: Assists with nutrition, care and transit needs. 336-349-2343  Cancer Center Support Programs: @10RELATIVEDAYS@ > Cancer Support Group  2nd Tuesday of the month 1pm-2pm, Journey Room  > Creative Journey  3rd Tuesday of the month 1130am-1pm, Journey Room  > Look Good Feel Better  1st Wednesday of the month 10am-12 noon, Journey Room (Call American Cancer Society to register 1-800-395-5775)    

## 2016-04-07 NOTE — Assessment & Plan Note (Signed)
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 S/P 2 cycles of BR (10/5- 11/29/2013) with progression of disease documented on 12/19/2013 PET scan requiring a change in therapy to R-CHOP x 3 cycles (01/02/2014- 02/13/2014) with complete response on PET on 02/06/2014 followed by radiation therapy, finishing all therapy on 04/12/2014.  Oncology history is up-to-date.  Labs today as ordered: CBC diff, CMET, LDH.  I personally reviewed and went over laboratory results with the patient.  The results are noted within this dictation.  Labs in 6 months: CBC diff, CMET, LDH, B2M.  Smoking cessation education is provided.  She refuses intervention at this time.  I would consider Wellbutrin given her ongoing anxiety/nervousness as a treatment option for her tobacco abuse.  Return in 6 months for follow-up.

## 2016-04-14 ENCOUNTER — Ambulatory Visit (INDEPENDENT_AMBULATORY_CARE_PROVIDER_SITE_OTHER): Payer: Medicare Other | Admitting: *Deleted

## 2016-04-14 DIAGNOSIS — I482 Chronic atrial fibrillation, unspecified: Secondary | ICD-10-CM

## 2016-04-14 DIAGNOSIS — Z5181 Encounter for therapeutic drug level monitoring: Secondary | ICD-10-CM | POA: Diagnosis not present

## 2016-04-14 LAB — POCT INR: INR: 2.5

## 2016-05-07 ENCOUNTER — Ambulatory Visit (HOSPITAL_COMMUNITY): Payer: Medicare Other | Attending: Internal Medicine | Admitting: Physical Therapy

## 2016-05-26 ENCOUNTER — Ambulatory Visit (INDEPENDENT_AMBULATORY_CARE_PROVIDER_SITE_OTHER): Payer: Medicare Other | Admitting: *Deleted

## 2016-05-26 DIAGNOSIS — I482 Chronic atrial fibrillation, unspecified: Secondary | ICD-10-CM

## 2016-05-26 DIAGNOSIS — Z5181 Encounter for therapeutic drug level monitoring: Secondary | ICD-10-CM

## 2016-05-26 LAB — POCT INR: INR: 3

## 2016-07-07 ENCOUNTER — Ambulatory Visit (INDEPENDENT_AMBULATORY_CARE_PROVIDER_SITE_OTHER): Payer: Medicare Other | Admitting: *Deleted

## 2016-07-07 DIAGNOSIS — Z5181 Encounter for therapeutic drug level monitoring: Secondary | ICD-10-CM

## 2016-07-07 DIAGNOSIS — I482 Chronic atrial fibrillation, unspecified: Secondary | ICD-10-CM

## 2016-07-07 LAB — POCT INR: INR: 2.5

## 2016-07-14 ENCOUNTER — Other Ambulatory Visit (HOSPITAL_COMMUNITY): Payer: Self-pay | Admitting: Oncology

## 2016-07-14 DIAGNOSIS — C8580 Other specified types of non-Hodgkin lymphoma, unspecified site: Secondary | ICD-10-CM

## 2016-07-15 ENCOUNTER — Other Ambulatory Visit: Payer: Self-pay | Admitting: Cardiovascular Disease

## 2016-08-18 ENCOUNTER — Ambulatory Visit (INDEPENDENT_AMBULATORY_CARE_PROVIDER_SITE_OTHER): Payer: Medicare Other | Admitting: *Deleted

## 2016-08-18 DIAGNOSIS — I482 Chronic atrial fibrillation, unspecified: Secondary | ICD-10-CM

## 2016-08-18 DIAGNOSIS — Z5181 Encounter for therapeutic drug level monitoring: Secondary | ICD-10-CM | POA: Diagnosis not present

## 2016-08-18 LAB — POCT INR: INR: 2.4

## 2016-08-21 DIAGNOSIS — I10 Essential (primary) hypertension: Secondary | ICD-10-CM | POA: Diagnosis not present

## 2016-08-25 ENCOUNTER — Other Ambulatory Visit: Payer: Self-pay

## 2016-08-26 DIAGNOSIS — R7301 Impaired fasting glucose: Secondary | ICD-10-CM | POA: Diagnosis not present

## 2016-08-26 DIAGNOSIS — Z8572 Personal history of non-Hodgkin lymphomas: Secondary | ICD-10-CM | POA: Diagnosis not present

## 2016-08-26 DIAGNOSIS — M25569 Pain in unspecified knee: Secondary | ICD-10-CM | POA: Diagnosis not present

## 2016-08-26 DIAGNOSIS — D2312 Other benign neoplasm of skin of left eyelid, including canthus: Secondary | ICD-10-CM | POA: Diagnosis not present

## 2016-08-26 DIAGNOSIS — E782 Mixed hyperlipidemia: Secondary | ICD-10-CM | POA: Diagnosis not present

## 2016-08-26 DIAGNOSIS — I10 Essential (primary) hypertension: Secondary | ICD-10-CM | POA: Diagnosis not present

## 2016-08-26 DIAGNOSIS — I482 Chronic atrial fibrillation: Secondary | ICD-10-CM | POA: Diagnosis not present

## 2016-08-26 DIAGNOSIS — F411 Generalized anxiety disorder: Secondary | ICD-10-CM | POA: Diagnosis not present

## 2016-08-26 DIAGNOSIS — Z681 Body mass index (BMI) 19 or less, adult: Secondary | ICD-10-CM | POA: Diagnosis not present

## 2016-09-01 DIAGNOSIS — C44319 Basal cell carcinoma of skin of other parts of face: Secondary | ICD-10-CM | POA: Diagnosis not present

## 2016-09-01 DIAGNOSIS — D224 Melanocytic nevi of scalp and neck: Secondary | ICD-10-CM | POA: Diagnosis not present

## 2016-09-05 DIAGNOSIS — H5203 Hypermetropia, bilateral: Secondary | ICD-10-CM | POA: Diagnosis not present

## 2016-09-05 DIAGNOSIS — H2513 Age-related nuclear cataract, bilateral: Secondary | ICD-10-CM | POA: Diagnosis not present

## 2016-10-01 ENCOUNTER — Ambulatory Visit (INDEPENDENT_AMBULATORY_CARE_PROVIDER_SITE_OTHER): Payer: Medicare Other | Admitting: *Deleted

## 2016-10-01 DIAGNOSIS — I482 Chronic atrial fibrillation, unspecified: Secondary | ICD-10-CM

## 2016-10-01 DIAGNOSIS — Z5181 Encounter for therapeutic drug level monitoring: Secondary | ICD-10-CM

## 2016-10-01 LAB — POCT INR: INR: 2.3

## 2016-10-08 ENCOUNTER — Other Ambulatory Visit (HOSPITAL_COMMUNITY): Payer: Medicare Other

## 2016-10-08 ENCOUNTER — Ambulatory Visit (HOSPITAL_COMMUNITY): Payer: Medicare Other

## 2016-10-08 ENCOUNTER — Ambulatory Visit (HOSPITAL_COMMUNITY): Payer: Medicare Other | Admitting: Oncology

## 2016-10-08 ENCOUNTER — Ambulatory Visit (HOSPITAL_COMMUNITY): Payer: Medicare Other | Admitting: Adult Health

## 2016-10-08 ENCOUNTER — Encounter (HOSPITAL_COMMUNITY): Payer: Self-pay

## 2016-10-08 ENCOUNTER — Encounter (HOSPITAL_COMMUNITY): Payer: Medicare Other | Attending: Oncology

## 2016-10-08 ENCOUNTER — Encounter (HOSPITAL_BASED_OUTPATIENT_CLINIC_OR_DEPARTMENT_OTHER): Payer: Medicare Other | Admitting: Oncology

## 2016-10-08 VITALS — BP 147/65 | HR 59 | Temp 97.8°F | Resp 18 | Wt 117.6 lb

## 2016-10-08 DIAGNOSIS — Z720 Tobacco use: Secondary | ICD-10-CM | POA: Diagnosis not present

## 2016-10-08 DIAGNOSIS — Z8572 Personal history of non-Hodgkin lymphomas: Secondary | ICD-10-CM

## 2016-10-08 DIAGNOSIS — C8580 Other specified types of non-Hodgkin lymphoma, unspecified site: Secondary | ICD-10-CM

## 2016-10-08 LAB — CBC WITH DIFFERENTIAL/PLATELET
Basophils Absolute: 0.1 10*3/uL (ref 0.0–0.1)
Basophils Relative: 1 %
EOS ABS: 0.9 10*3/uL — AB (ref 0.0–0.7)
EOS PCT: 10 %
HCT: 38.5 % (ref 36.0–46.0)
Hemoglobin: 12.6 g/dL (ref 12.0–15.0)
LYMPHS ABS: 2.4 10*3/uL (ref 0.7–4.0)
Lymphocytes Relative: 27 %
MCH: 29.9 pg (ref 26.0–34.0)
MCHC: 32.7 g/dL (ref 30.0–36.0)
MCV: 91.4 fL (ref 78.0–100.0)
Monocytes Absolute: 1 10*3/uL (ref 0.1–1.0)
Monocytes Relative: 11 %
Neutro Abs: 4.6 10*3/uL (ref 1.7–7.7)
Neutrophils Relative %: 51 %
PLATELETS: 296 10*3/uL (ref 150–400)
RBC: 4.21 MIL/uL (ref 3.87–5.11)
RDW: 14.8 % (ref 11.5–15.5)
WBC: 8.9 10*3/uL (ref 4.0–10.5)

## 2016-10-08 LAB — COMPREHENSIVE METABOLIC PANEL
ALBUMIN: 3.7 g/dL (ref 3.5–5.0)
ALT: 16 U/L (ref 14–54)
AST: 20 U/L (ref 15–41)
Alkaline Phosphatase: 58 U/L (ref 38–126)
Anion gap: 9 (ref 5–15)
BUN: 14 mg/dL (ref 6–20)
CO2: 29 mmol/L (ref 22–32)
Calcium: 8.7 mg/dL — ABNORMAL LOW (ref 8.9–10.3)
Chloride: 101 mmol/L (ref 101–111)
Creatinine, Ser: 0.8 mg/dL (ref 0.44–1.00)
GFR calc Af Amer: >60 mL/min (ref >60)
GFR calc non Af Amer: >60 mL/min (ref >60)
GLUCOSE: 105 mg/dL — AB (ref 65–99)
POTASSIUM: 4.1 mmol/L (ref 3.5–5.1)
SODIUM: 139 mmol/L (ref 135–145)
TOTAL PROTEIN: 6.9 g/dL (ref 6.5–8.1)
Total Bilirubin: 0.3 mg/dL (ref 0.3–1.2)

## 2016-10-08 LAB — LACTATE DEHYDROGENASE: LDH: 155 U/L (ref 98–192)

## 2016-10-08 MED ORDER — CALCIUM CARBONATE-VITAMIN D 500-200 MG-UNIT PO TABS: 1.0000 | ORAL_TABLET | Freq: Every day | ORAL | 2 refills | Status: DC

## 2016-10-08 NOTE — Progress Notes (Signed)
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      10/06/2013 PET scan    Hypermetabolic thickening within the right vallecular consists with primary head and neck cancer.  Single enlarged hypermetabolic right level IIa lymph node consistent local nodal metastasis.      10/31/2013 - 11/29/2013 Chemotherapy    Bendamustine/Rituxan x 2 cycles      12/19/2013 Progression    PET- Progression of presumed vallecular lymphoma as evidenced by increased hypermetabolism.      12/20/2013 Treatment Plan Change    Per Dr. Gari Crown, patient has a CD10 negative, non-germinal center non-Hodgkin's lymphoma.  Patient may be a candidate for additional of Revlimid to treatment plan.      12/28/2013 Echocardiogram    MUGA- Normal left ventricular wall motion with calculated ejection fraction of 60%.      01/02/2014 - 02/13/2014 Chemotherapy    R-CHOP with Neulasta support.      02/06/2014 Remission    PET- Resolution of the previously enlarged right-sided station 2 lymph  node in the neck with resolution of the abnormal right vallecular  activity in the neck. Currently no specific in findings of residual  lymphoma.       - 04/12/2014 Radiation Therapy    Dr. Lisbeth Renshaw      07/12/2014 Procedure    Port removed- Dr. Arnoldo Morale       CURRENT THERAPY: Observation  INTERVAL HISTORY: Marissa Turner 76 y.o. female returns today in regards to her NHL. She states that she is doing well. Her energy level and appetite are at 100%. She denies any new lymphadenopathy. She has no pain. She denies any chest pain, shortness breath, abdominal pain, nausea, vomiting, diarrhea, focal weakness. She is very active and watches her grandchildren.    Current Outpatient Prescriptions on File Prior to  Visit  Medication Sig Dispense Refill  . acetaminophen (TYLENOL) 500 MG tablet Take 1,000 mg by mouth daily as needed for headache.    . ALPRAZolam (XANAX) 1 MG tablet Take 1 tablet (1 mg total) by mouth 3 (three) times daily as needed for anxiety. (Patient taking differently: Take 1 mg by mouth daily. ) 30 tablet 0  . citalopram (CELEXA) 20 MG tablet TAKE ONE (1) TABLET BY MOUTH EVERY DAY 30 tablet 5  . digoxin (LANOXIN) 0.125 MG tablet Take 125 mcg by mouth daily.     [name] diltiazem (DILACOR XR) 120 MG 24 hr capsule Take 120 mg by mouth daily.    . fexofenadine (KP FEXOFENADINE HCL) 180 MG tablet Take 180 mg by mouth daily.    . metoprolol (TOPROL-XL) 25 MG 24 hr tablet Take 12.5 mg by mouth 2 (two) times daily.     . Turmeric 500 MG TABS Take 1 tablet by mouth 2 (two) times daily.    [name] warfarin (COUMADIN) 5 MG tablet TAKE 1 TABLET DAILY EXCEPT 1/2 TABLET ONMONDAYS AND THURSDAYS. 30 tablet 3   No current facility-administered medications on file prior to visit.     MEDICAL HISTORY: Past Medical History:  Diagnosis Date  . Atrial fibrillation, chronic ([place]) 2004   left atrial thrombus in 2004; near syncope in 2008; echocardiogram in 2008-normal EF, mild left atrial enlargement, no valvular abnormalities, lipomatous hypertrophy;  no aortic atheroma on TEE in 2004  . Cancer ([facility])    LYMPH NODE  . Chronic anticoagulation 2004   warfarin  . Depression   . DJD (degenerative joint disease), lumbosacral    Also hip  . Dysrhythmia    chronic AFib  . Fracture of wrist    Left  . Hypertension    Normal carotid ultrasound in 2008  . Peptic ulcer disease    s/p Billroth II  . PONV (postoperative nausea and vomiting)   . Tobacco abuse    40 pack years; quit attempt in 2012    has SPINAL STENOSIS; DJD (degenerative joint disease), lumbosacral; Atrial fibrillation, chronic ([place]); Chronic anticoagulation; Tobacco abuse; Peptic ulcer disease; Hypertension; Encounter for therapeutic drug  monitoring; Lymphoma malignant, large cell ([place]); Iron deficiency; Knee pain, right; and Depression on her problem list.     is allergic to diphenhydramine hcl; estrogens; penicillins; and codeine.  Ms. Orrison had no medications administered during this visit.  SURGICAL HISTORY: Past Surgical History:  Procedure Laterality Date  . ABDOMINAL HYSTERECTOMY     w/o oophorectomy  . APPENDECTOMY    . CESAREAN SECTION    . CHOLECYSTECTOMY  2004   [place]   patient denies ever having undergone colonoscopy as of 2014  . GASTROJEJUNOSTOMY  1984   Billroth II; splenectomy; incidental appendectomy  . KNEE ARTHROSCOPY WITH MEDIAL MENISECTOMY Right 02/26/2015   Procedure: KNEE ARTHROSCOPY WITH MEDIAL MENISECTOMY;  Surgeon: Sanjuana Kava, MD;  Location: [facility];  Service: Orthopedics;  Laterality: Right;  . LYMPH NODE BIOPSY Left 09/15/2013   Procedure: LEFT NECK CERVICAL NODE BIOPSY;  Surgeon: Scherry Ran, MD;  Location: [facility];  Service: General;  Laterality: Left;  . PORT-A-CATH REMOVAL Right 07/12/2014   Procedure: REMOVAL PORT-A-CATH;  Surgeon: Aviva Signs Md, MD;  Location: [facility];  Service: General;  Laterality: Right;  . PORTACATH PLACEMENT Right 10/21/2013   Procedure: ATTEMPTED INSERTION PORT-A-CATH;  Surgeon: Scherry Ran, MD;  Location: [facility];  Service: General;  Laterality: Right;  . SPLENECTOMY, TOTAL    . TONSILLECTOMY      SOCIAL HISTORY: Social History   Social History  . Marital status: Married    Spouse name: N/A  . Number of children: 2  . Years of education: N/A   Occupational History  . Housecleaning    Social History Main Topics  . Smoking status: Current Every Day Smoker    Packs/day: 0.25    Years: 55.00    Types: Cigarettes  . Smokeless tobacco: Never Used     Comment: Quit attempt in 2012  . Alcohol use No  . Drug use: No  . Sexual activity: Yes    Birth control/ protection: Surgical   Other Topics Concern  .  Not on file   Social History Narrative   Married with one child and one adopted child, 1 stillbirth, and one deceased child at age 33 due to illness   No regular exercise  2 of her children died. Has 1 living son.  FAMILY HISTORY: Family History  Problem Relation Age of Onset  . Heart attack Mother        also brother  . Breast cancer Sister   . Heart attack Brother   . Breast cancer Sister        x2  . Cancer Brother        Gastric carcinoma; also father  . Cirrhosis Sister  Review of Systems  Constitutional: Negative for fever, chills, weight loss and malaise/fatigue.  HENT: Negative for congestion, hearing loss, nosebleeds, sore throat and tinnitus.   Eyes: Negative for blurred vision, double vision, pain and discharge.  Respiratory: Negative for cough, hemoptysis, sputum production, shortness of breath and wheezing.   Cardiovascular: Negative for chest pain, palpitations, claudication, leg swelling and PND.  Gastrointestinal: Negative for heartburn, nausea, vomiting, abdominal pain, diarrhea, constipation, blood in stool and melena.  Genitourinary: Negative for dysuria, urgency, frequency and hematuria.  Musculoskeletal: no joint pains or muscle pain. Skin: Negative for itching and rash.  Neurological: Negative for dizziness, tingling, tremors, sensory change, speech change, focal weakness, seizures, loss of consciousness, weakness and headaches.  Endo/Heme/Allergies: Does not bruise/bleed easily.  Psychiatric/Behavioral: Negative for depression, suicidal ideas, memory loss and substance abuse. The patient is not nervous/anxious and does not have insomnia.   14 point review of systems was performed and is negative except as detailed under history of present illness and above   PHYSICAL EXAMINATION  ECOG PERFORMANCE STATUS: 0 - Asymptomatic  Vitals:   10/08/16 1009  BP: (!) 147/65  Pulse: (!) 59  Resp: 18  Temp: 97.8 F (36.6 C)  SpO2: 97%   Physical Exam    Constitutional: She is oriented to person, place, and time and well-developed, well-nourished, and in no distress. Wears glasses.  HENT:  Head: Normocephalic and atraumatic.  Mouth/Throat: Oropharynx is clear and moist. No oropharyngeal exudate.  Eyes: Conjunctivae and EOM are normal. Pupils are equal, round, and reactive to light. Right eye exhibits no discharge. Left eye exhibits no discharge.  Neck: Normal range of motion. Neck supple. No JVD present. No thyromegaly present.  Cardiovascular: Normal rate, regular rhythm and normal heart sounds.   No murmur heard. Pulmonary/Chest: Effort normal and breath sounds normal. No respiratory distress. She has no wheezes. She has no rales. She exhibits no tenderness.  Abdominal: Soft. Bowel sounds are normal. She exhibits no distension and no mass. There is no tenderness. There is no rebound and no guarding.  Musculoskeletal: Normal range of motion. Mild Right knee swelling.  Lymphadenopathy:    She has no cervical adenopathy. No supraclavicular lymphadenopathy palpated. No axillary lymphadenopathy palpated. Neurological: She is alert and oriented to person, place, and time. She displays normal reflexes. No cranial nerve deficit. Coordination normal.  Skin: Skin is warm and dry. No rash noted. No erythema. No pallor.  Psychiatric: Mood, memory, affect and judgment normal.    LABORATORY DATA: I have reviewed the data as listed. CBC    Component Value Date/Time   WBC 8.9 10/08/2016 0859   RBC 4.21 10/08/2016 0859   HGB 12.6 10/08/2016 0859   HCT 38.5 10/08/2016 0859   PLT 296 10/08/2016 0859   MCV 91.4 10/08/2016 0859   MCH 29.9 10/08/2016 0859   MCHC 32.7 10/08/2016 0859   RDW 14.8 10/08/2016 0859   LYMPHSABS 2.4 10/08/2016 0859   MONOABS 1.0 10/08/2016 0859   EOSABS 0.9 (H) 10/08/2016 0859   BASOSABS 0.1 10/08/2016 0859   CMP     Component Value Date/Time   NA 139 10/08/2016 0859   K 4.1 10/08/2016 0859   CL 101 10/08/2016 0859    CO2 29 10/08/2016 0859   GLUCOSE 105 (H) 10/08/2016 0859   BUN 14 10/08/2016 0859   CREATININE 0.80 10/08/2016 0859   CREATININE 0.65 06/30/2013 0840   CALCIUM 8.7 (L) 10/08/2016 0859   PROT 6.9 10/08/2016 0859   ALBUMIN 3.7 10/08/2016 0859  AST 20 10/08/2016 0859   ALT 16 10/08/2016 0859   ALKPHOS 58 10/08/2016 0859   BILITOT 0.3 10/08/2016 0859   GFRNONAA >60 10/08/2016 0859   GFRAA >60 10/08/2016 0859     PATHOLOGY: Interpretation Tissue-Flow Cytometry - PREDOMINANCE OF B CELLS. - SEE NOTE. Diagnosis Comment: Analysis of the lymphoid population shows predominance of B cells displaying pan B cell antigens including CD20 with lack of CD5 or CD10. B cells display lambda light chain excess. The overall features are worrisome for a B cell lymphoproliferative process.  Diagnosis Lymph node, biopsy, cervical left neck - DIFFUSE LARGE B CELL LYMPHOMA. - SEE ONCOLOGY TABLE.  Study Result   CLINICAL DATA:  The patient returns after screening study for evaluation of possible right breast asymmetry.  EXAM: 2D DIGITAL DIAGNOSTIC UNILATERAL RIGHT MAMMOGRAM WITH CAD AND ADJUNCT TOMO  COMPARISON:  10/05/2015 and early  ACR Breast Density Category c: The breast tissue is heterogeneously dense, which may obscure small masses.  FINDINGS: Additional views are performed, demonstrating no persistent abnormality the lower portion of the right breast. No suspicious mass, distortion, or microcalcifications are identified to suggest presence of malignancy.  Mammographic images were processed with CAD.  IMPRESSION: No mammographic evidence for malignancy.  RECOMMENDATION: Screening mammogram in one year.(Code:[id number]-B-01Y)  I have discussed the findings and recommendations with the patient. Results were also provided in writing at the conclusion of the visit. If applicable, a reminder letter will be sent to the patient regarding the next appointment.  BI-RADS  CATEGORY  1: Negative.   Electronically Signed   By: Nolon Nations M.D.   On: 10/16/2015 13:42     CLINICAL DATA: Subsequent treatment strategy for non-Hodgkin' s lymphoma.  EXAM: NUCLEAR MEDICINE PET SKULL BASE TO THIGH  IMPRESSION: 1. Resolution of the previously enlarged right-sided station 2 lymph node in the neck with resolution of the abnormal right vallecular activity in the neck. Currently no specific in findings of residual lymphoma. 2. Diffuse slightly heterogeneous marrow activity, probably incidental or due to marrow reactivation. 3. Bilateral upper lobe pulmonary nodules, larger on the left. The left-sided nodule has been reported back through 2004 and is not hypermetabolic, and likely benign. 4. Ancillary findings include diffuse hepatic steatosis, atherosclerosis, bilateral renal hips, severe arthropathy of the left hip, and emphysema.   Electronically Signed  By: Sherryl Barters M.D.  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 Reviewed her CBC and CMP with her in detail.  Patient feels great and is asymptomatic. No clinical indication to repeat scans. RTC in 6 months for follow up with labs.  Orders Placed This Encounter  Procedures  . CBC with Differential    Standing Status:   Future    Standing Expiration Date:   10/08/2017  . Comprehensive metabolic panel    Standing Status:   Future    Standing Expiration Date:   10/08/2017  . Lactate dehydrogenase    Standing Status:   Future    Standing Expiration Date:   10/08/2017    NCCN guidelines recommends the follow surveillance for Stage I, II for those who asertain a complete response in the first-line treatment setting (3.2017):  A. H&P every 3-6 months for 5 years, then yearly or as clinically indicated.  B. Labs every 3-6 months for 5 years and then  annually or as clinically indicated.  C. Imaging only as  indicated. For those ascertaining a complete response in the Stage III, IV setting in first-line treatment setting:  A. H&P every 3-6 months for 5 years, then yearly or as clinically indicated.  B. Labs every 3-6 months for 5 years and then annually or as clinically indicated.  C. CT C/A/P with contrast no more than every 6 months for 2 years after  completion of treatment; then only as indicated.  All questions were answered. The patient knows to call the clinic with any problems, questions or concerns. We can certainly see the patient much sooner if necessary.  Twana First, MD

## 2016-10-08 NOTE — Patient Instructions (Signed)
[facility] at [facility] Discharge Instructions  RECOMMENDATIONS MADE BY THE CONSULTANT AND ANY TEST RESULTS WILL BE SENT TO YOUR REFERRING PHYSICIAN.  You were seen today by Dr. Louise Zhou Follow up in 6 months with lab work   Thank you for choosing [facility] at [facility] to provide your oncology and hematology care.  To afford each patient quality time with our provider, please arrive at least 15 minutes before your scheduled appointment time.    If you have a lab appointment with the Cancer Center please come in thru the  Main Entrance and check in at the main information desk  You need to re-schedule your appointment should you arrive 10 or more minutes late.  We strive to give you quality time with our providers, and arriving late affects you and other patients whose appointments are after yours.  Also, if you no show three or more times for appointments you may be dismissed from the clinic at the providers discretion.     Again, thank you for choosing [facility].  Our hope is that these requests will decrease the amount of time that you wait before being seen by our physicians.       _____________________________________________________________  Should you have questions after your visit to [facility], please contact our office at [phone] between the hours of 8:30 a.m. and 4:30 p.m.  Voicemails left after 4:30 p.m. will not be returned until the following business day.  For prescription refill requests, have your pharmacy contact our office.       Resources For Cancer Patients and their Caregivers ? American Cancer Society: Can assist with transportation, wigs, general needs, runs Look Good Feel Better.        1-888-227-6333 ? Cancer Care: Provides financial assistance, online support groups, medication/co-pay assistance.  1-800-813-HOPE (4673) ? [facility] Assists  [place] cancer patients and their families through emotional , educational and financial support.  336-427-4357 ? [place] DSS Where to apply for food stamps, Medicaid and utility assistance. 336-342-1394 ? [facility]: Transportation to medical appointments. 336-347-2287 ? Social Security Administration: May apply for disability if have a Stage IV cancer. 336-342-7796 1-800-772-1213 ? [place] Aging, Disability and Transit Services: Assists with nutrition, care and transit needs. 336-349-2343  Cancer Center Support Programs: @10RELATIVEDAYS@ > Cancer Support Group  2nd Tuesday of the month 1pm-2pm, Journey Room  > Creative Journey  3rd Tuesday of the month 1130am-1pm, Journey Room  > Look Good Feel Better  1st Wednesday of the month 10am-12 noon, Journey Room (Call American Cancer Society to register 1-800-395-5775)    

## 2016-10-09 LAB — BETA 2 MICROGLOBULIN, SERUM: BETA 2 MICROGLOBULIN: 1.7 mg/L (ref 0.6–2.4)

## 2016-10-27 DIAGNOSIS — L509 Urticaria, unspecified: Secondary | ICD-10-CM | POA: Diagnosis not present

## 2016-10-27 DIAGNOSIS — L309 Dermatitis, unspecified: Secondary | ICD-10-CM | POA: Diagnosis not present

## 2016-10-27 DIAGNOSIS — Z23 Encounter for immunization: Secondary | ICD-10-CM | POA: Diagnosis not present

## 2016-11-10 DIAGNOSIS — C441191 Basal cell carcinoma of skin of left upper eyelid, including canthus: Secondary | ICD-10-CM | POA: Diagnosis not present

## 2016-11-12 ENCOUNTER — Ambulatory Visit (INDEPENDENT_AMBULATORY_CARE_PROVIDER_SITE_OTHER): Payer: Medicare Other | Admitting: *Deleted

## 2016-11-12 DIAGNOSIS — I482 Chronic atrial fibrillation, unspecified: Secondary | ICD-10-CM

## 2016-11-12 DIAGNOSIS — Z5181 Encounter for therapeutic drug level monitoring: Secondary | ICD-10-CM

## 2016-11-12 LAB — POCT INR: INR: 2.2

## 2016-11-28 ENCOUNTER — Other Ambulatory Visit: Payer: Self-pay | Admitting: Cardiovascular Disease

## 2016-11-28 DIAGNOSIS — L309 Dermatitis, unspecified: Secondary | ICD-10-CM | POA: Diagnosis not present

## 2016-11-28 DIAGNOSIS — Z6821 Body mass index (BMI) 21.0-21.9, adult: Secondary | ICD-10-CM | POA: Diagnosis not present

## 2016-12-24 ENCOUNTER — Encounter: Payer: Self-pay | Admitting: *Deleted

## 2017-01-06 ENCOUNTER — Other Ambulatory Visit (HOSPITAL_COMMUNITY): Payer: Self-pay | Admitting: Oncology

## 2017-01-06 DIAGNOSIS — C8580 Other specified types of non-Hodgkin lymphoma, unspecified site: Secondary | ICD-10-CM

## 2017-01-07 ENCOUNTER — Ambulatory Visit (INDEPENDENT_AMBULATORY_CARE_PROVIDER_SITE_OTHER): Payer: Medicare Other | Admitting: *Deleted

## 2017-01-07 DIAGNOSIS — Z5181 Encounter for therapeutic drug level monitoring: Secondary | ICD-10-CM

## 2017-01-07 DIAGNOSIS — I482 Chronic atrial fibrillation, unspecified: Secondary | ICD-10-CM

## 2017-01-07 LAB — POCT INR: INR: 1.6

## 2017-01-28 ENCOUNTER — Encounter: Payer: Self-pay | Admitting: *Deleted

## 2017-02-02 ENCOUNTER — Ambulatory Visit (INDEPENDENT_AMBULATORY_CARE_PROVIDER_SITE_OTHER): Payer: Medicare Other | Admitting: *Deleted

## 2017-02-02 DIAGNOSIS — Z5181 Encounter for therapeutic drug level monitoring: Secondary | ICD-10-CM

## 2017-02-02 DIAGNOSIS — I482 Chronic atrial fibrillation, unspecified: Secondary | ICD-10-CM

## 2017-02-02 LAB — POCT INR: INR: 2.6

## 2017-02-02 NOTE — Patient Instructions (Signed)
Continue coumadin 1 tablet daily except 1/2 tablet on Mondays and Thursdays. Recheck in 4 weeks.

## 2017-02-13 DIAGNOSIS — Z6822 Body mass index (BMI) 22.0-22.9, adult: Secondary | ICD-10-CM | POA: Diagnosis not present

## 2017-02-13 DIAGNOSIS — J069 Acute upper respiratory infection, unspecified: Secondary | ICD-10-CM | POA: Diagnosis not present

## 2017-03-02 ENCOUNTER — Ambulatory Visit (INDEPENDENT_AMBULATORY_CARE_PROVIDER_SITE_OTHER): Payer: Medicare Other | Admitting: *Deleted

## 2017-03-02 DIAGNOSIS — I482 Chronic atrial fibrillation, unspecified: Secondary | ICD-10-CM

## 2017-03-02 DIAGNOSIS — Z5181 Encounter for therapeutic drug level monitoring: Secondary | ICD-10-CM

## 2017-03-02 LAB — POCT INR: INR: 3.2

## 2017-03-02 NOTE — Patient Instructions (Signed)
Hold coumadin tonight then resume 1 tablet daily except 1/2 tablet on Mondays and Thursdays. Recheck in 4 weeks.

## 2017-03-03 DIAGNOSIS — Z6822 Body mass index (BMI) 22.0-22.9, adult: Secondary | ICD-10-CM | POA: Diagnosis not present

## 2017-03-03 DIAGNOSIS — M1612 Unilateral primary osteoarthritis, left hip: Secondary | ICD-10-CM | POA: Diagnosis not present

## 2017-03-03 DIAGNOSIS — M7062 Trochanteric bursitis, left hip: Secondary | ICD-10-CM | POA: Diagnosis not present

## 2017-03-06 ENCOUNTER — Other Ambulatory Visit (HOSPITAL_COMMUNITY): Payer: Self-pay | Admitting: Internal Medicine

## 2017-03-06 ENCOUNTER — Ambulatory Visit (HOSPITAL_COMMUNITY)
Admission: RE | Admit: 2017-03-06 | Discharge: 2017-03-06 | Disposition: A | Discharge status: Home / Self Care | Payer: Medicare Other | Source: Ambulatory Visit | Attending: Internal Medicine | Admitting: Internal Medicine

## 2017-03-06 DIAGNOSIS — R52 Pain, unspecified: Secondary | ICD-10-CM

## 2017-03-06 DIAGNOSIS — M25552 Pain in left hip: Secondary | ICD-10-CM | POA: Insufficient documentation

## 2017-03-30 ENCOUNTER — Ambulatory Visit (INDEPENDENT_AMBULATORY_CARE_PROVIDER_SITE_OTHER): Payer: Medicare Other | Admitting: *Deleted

## 2017-03-30 DIAGNOSIS — I482 Chronic atrial fibrillation, unspecified: Secondary | ICD-10-CM

## 2017-03-30 DIAGNOSIS — Z5181 Encounter for therapeutic drug level monitoring: Secondary | ICD-10-CM

## 2017-03-30 LAB — POCT INR: INR: 3.5

## 2017-03-30 NOTE — Patient Instructions (Signed)
Hold coumadin tonight then decrease dose to 1 tablet daily except 1/2 tablet on Mondays, Wednesdays and Fridays. Recheck in 3 weeks.

## 2017-04-03 ENCOUNTER — Inpatient Hospital Stay (HOSPITAL_COMMUNITY): Payer: Medicare Other | Attending: Internal Medicine

## 2017-04-03 DIAGNOSIS — I4891 Unspecified atrial fibrillation: Secondary | ICD-10-CM | POA: Diagnosis not present

## 2017-04-03 DIAGNOSIS — Z8572 Personal history of non-Hodgkin lymphomas: Secondary | ICD-10-CM | POA: Insufficient documentation

## 2017-04-03 DIAGNOSIS — C8580 Other specified types of non-Hodgkin lymphoma, unspecified site: Secondary | ICD-10-CM

## 2017-04-03 LAB — COMPREHENSIVE METABOLIC PANEL
ALT: 21 U/L (ref 14–54)
AST: 26 U/L (ref 15–41)
Albumin: 3.6 g/dL (ref 3.5–5.0)
Alkaline Phosphatase: 71 U/L (ref 38–126)
Anion gap: 11 (ref 5–15)
BUN: 12 mg/dL (ref 6–20)
CO2: 25 mmol/L (ref 22–32)
Calcium: 8.5 mg/dL — ABNORMAL LOW (ref 8.9–10.3)
Chloride: 101 mmol/L (ref 101–111)
Creatinine, Ser: 0.67 mg/dL (ref 0.44–1.00)
Glucose, Bld: 113 mg/dL — ABNORMAL HIGH (ref 65–99)
POTASSIUM: 4 mmol/L (ref 3.5–5.1)
Sodium: 137 mmol/L (ref 135–145)
Total Bilirubin: 0.5 mg/dL (ref 0.3–1.2)
Total Protein: 7 g/dL (ref 6.5–8.1)

## 2017-04-03 LAB — CBC WITH DIFFERENTIAL/PLATELET
BASOS ABS: 0 10*3/uL (ref 0.0–0.1)
BASOS PCT: 1 %
EOS ABS: 0.2 10*3/uL (ref 0.0–0.7)
EOS PCT: 2 %
HCT: 38.8 % (ref 36.0–46.0)
Hemoglobin: 12.1 g/dL (ref 12.0–15.0)
LYMPHS PCT: 29 %
Lymphs Abs: 2.4 10*3/uL (ref 0.7–4.0)
MCH: 27.6 pg (ref 26.0–34.0)
MCHC: 31.2 g/dL (ref 30.0–36.0)
MCV: 88.4 fL (ref 78.0–100.0)
MONO ABS: 0.7 10*3/uL (ref 0.1–1.0)
Monocytes Relative: 9 %
Neutro Abs: 5 10*3/uL (ref 1.7–7.7)
Neutrophils Relative %: 59 %
Platelets: 369 10*3/uL (ref 150–400)
RBC: 4.39 MIL/uL (ref 3.87–5.11)
RDW: 15.5 % (ref 11.5–15.5)
WBC: 8.4 10*3/uL (ref 4.0–10.5)

## 2017-04-03 LAB — LACTATE DEHYDROGENASE: LDH: 174 U/L (ref 98–192)

## 2017-04-10 ENCOUNTER — Encounter (HOSPITAL_COMMUNITY): Payer: Self-pay | Admitting: Internal Medicine

## 2017-04-10 ENCOUNTER — Inpatient Hospital Stay (HOSPITAL_BASED_OUTPATIENT_CLINIC_OR_DEPARTMENT_OTHER): Payer: Medicare Other | Admitting: Internal Medicine

## 2017-04-10 ENCOUNTER — Other Ambulatory Visit: Payer: Self-pay

## 2017-04-10 VITALS — BP 152/77 | HR 70 | Temp 98.3°F | Resp 20 | Wt 114.8 lb

## 2017-04-10 DIAGNOSIS — F17210 Nicotine dependence, cigarettes, uncomplicated: Secondary | ICD-10-CM

## 2017-04-10 DIAGNOSIS — C8580 Other specified types of non-Hodgkin lymphoma, unspecified site: Secondary | ICD-10-CM | POA: Diagnosis not present

## 2017-04-10 DIAGNOSIS — Z8572 Personal history of non-Hodgkin lymphomas: Secondary | ICD-10-CM | POA: Diagnosis not present

## 2017-04-10 DIAGNOSIS — I10 Essential (primary) hypertension: Secondary | ICD-10-CM

## 2017-04-10 DIAGNOSIS — I4891 Unspecified atrial fibrillation: Secondary | ICD-10-CM | POA: Diagnosis not present

## 2017-04-10 DIAGNOSIS — Z720 Tobacco use: Secondary | ICD-10-CM

## 2017-04-10 NOTE — Patient Instructions (Signed)
[place] at [facility] Discharge Instructions   You were seen today by Dr. Zoila Shutter. She went over your labs and they were all normal. We will see you back for follow up in 6 months. She would like to refer you to the lung cancer screening program.   Thank you for choosing [place] at [facility] to provide your oncology and hematology care.  To afford each patient quality time with our provider, please arrive at least 15 minutes before your scheduled appointment time.    If you have a lab appointment with the [facility] please come in thru the  Main Entrance and check in at the main information desk  You need to re-schedule your appointment should you arrive 10 or more minutes late.  We strive to give you quality time with our providers, and arriving late affects you and other patients whose appointments are after yours.  Also, if you no show three or more times for appointments you may be dismissed from the clinic at the providers discretion.     Again, thank you for choosing [facility].  Our hope is that these requests will decrease the amount of time that you wait before being seen by our physicians.       _____________________________________________________________  Should you have questions after your visit to [facility], please contact our office at [phone] between the hours of 8:30 a.m. and 4:30 p.m.  Voicemails left after 4:30 p.m. will not be returned until the following business day.  For prescription refill requests, have your pharmacy contact our office.       Resources For Cancer Patients and their Caregivers ? American Cancer Society: Can assist with transportation, wigs, general needs, runs Look Good Feel Better.        (574)324-7325 ? Cancer Care: Provides financial assistance, online support groups, medication/co-pay assistance.  1-800-813-HOPE 530-409-6083) ? [facility] Assists [place] cancer patients and their families through emotional , educational and financial support.  267-671-7622 ? [place] DSS Where to apply for food stamps, Medicaid and utility assistance. 504-199-0914 ? [facility]: Transportation to medical appointments. 701-083-7301 ? Social Security Administration: May apply for disability if have a Stage IV cancer. (606)724-3624 619-869-4177 ? [employer], Disability and Transit Services: Assists with nutrition, care and transit needs. Mobile Support Programs:   > Cancer Support Group  2nd Tuesday of the month 1pm-2pm, Journey Room   > Creative Journey  3rd Tuesday of the month 1130am-1pm, Journey Room

## 2017-04-16 DIAGNOSIS — N921 Excessive and frequent menstruation with irregular cycle: Secondary | ICD-10-CM | POA: Diagnosis not present

## 2017-04-16 DIAGNOSIS — M1612 Unilateral primary osteoarthritis, left hip: Secondary | ICD-10-CM | POA: Diagnosis not present

## 2017-04-16 DIAGNOSIS — M7062 Trochanteric bursitis, left hip: Secondary | ICD-10-CM | POA: Diagnosis not present

## 2017-04-16 DIAGNOSIS — I10 Essential (primary) hypertension: Secondary | ICD-10-CM | POA: Diagnosis not present

## 2017-04-16 DIAGNOSIS — F411 Generalized anxiety disorder: Secondary | ICD-10-CM | POA: Diagnosis not present

## 2017-04-16 DIAGNOSIS — Z6821 Body mass index (BMI) 21.0-21.9, adult: Secondary | ICD-10-CM | POA: Diagnosis not present

## 2017-04-16 DIAGNOSIS — Z6822 Body mass index (BMI) 22.0-22.9, adult: Secondary | ICD-10-CM | POA: Diagnosis not present

## 2017-04-16 DIAGNOSIS — L309 Dermatitis, unspecified: Secondary | ICD-10-CM | POA: Diagnosis not present

## 2017-04-16 DIAGNOSIS — M159 Polyosteoarthritis, unspecified: Secondary | ICD-10-CM | POA: Diagnosis not present

## 2017-04-16 DIAGNOSIS — I482 Chronic atrial fibrillation: Secondary | ICD-10-CM | POA: Diagnosis not present

## 2017-04-16 DIAGNOSIS — E782 Mixed hyperlipidemia: Secondary | ICD-10-CM | POA: Diagnosis not present

## 2017-04-16 DIAGNOSIS — R21 Rash and other nonspecific skin eruption: Secondary | ICD-10-CM | POA: Diagnosis not present

## 2017-04-27 ENCOUNTER — Ambulatory Visit (INDEPENDENT_AMBULATORY_CARE_PROVIDER_SITE_OTHER): Payer: Medicare Other | Admitting: *Deleted

## 2017-04-27 DIAGNOSIS — Z5181 Encounter for therapeutic drug level monitoring: Secondary | ICD-10-CM | POA: Diagnosis not present

## 2017-04-27 DIAGNOSIS — I482 Chronic atrial fibrillation, unspecified: Secondary | ICD-10-CM

## 2017-04-27 LAB — POCT INR: INR: 1.8

## 2017-04-27 NOTE — Patient Instructions (Signed)
Take coumadin 1 tablet tonight then resume 1 tablet daily except 1/2 tablet on Mondays, Wednesdays and Fridays. Recheck in 3 weeks.

## 2017-05-11 NOTE — Progress Notes (Signed)
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  Last Pet scan done 02/06/2014 showed  IMPRESSION: 1. Resolution of the previously enlarged right-sided station 2 lymph node in the neck with resolution of the abnormal right vallecular activity in the neck. Currently no specific in findings of residual lymphoma. 2. Diffuse slightly heterogeneous marrow activity, probably incidental or due to marrow reactivation. 3. Bilateral upper lobe pulmonary nodules, larger on the left. The left-sided nodule has been reported back through 2004 and is not hypermetabolic, and likely benign. 4. Ancillary findings include diffuse hepatic steatosis, atherosclerosis, bilateral renal hips, severe arthropathy of the left hip, and emphysema.  She remains on observation.  Labs on 04/03/2017 WNL.  Pt will RTC in 09/2017 for follow-up and repeat labs.    2.  HTN.  BP 152/77.  Follow-up with PCP.    3.  Smoking.  Cessation recommended.  Will determine if she is a candidate for lung cancer screening program. Will consider CT of chest on RTC for follow-up of pulmonary nodules.    Current Status.  77 year old female seen today for follow-up.  She is doing well and denies any fevers, chills, night sweats, anorexia and has noted no adenopathy.      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      10/06/2013 PET scan    Hypermetabolic thickening within the right vallecular consists with primary head and neck cancer.  Single enlarged hypermetabolic right level IIa lymph node consistent local nodal metastasis.      10/31/2013 - 11/29/2013 Chemotherapy    Bendamustine/Rituxan x 2 cycles      12/19/2013 Progression    PET- Progression of presumed vallecular lymphoma as evidenced by increased hypermetabolism.      12/20/2013 Treatment Plan Change    Per Dr. Gari Crown, patient has a CD10 negative, non-germinal center non-Hodgkin's lymphoma.  Patient may be a candidate for additional of Revlimid to treatment plan.      12/28/2013 Echocardiogram    MUGA- Normal left ventricular wall motion with calculated ejection fraction of 60%.      01/02/2014 - 02/13/2014 Chemotherapy    R-CHOP with Neulasta support.      02/06/2014 Remission    PET- Resolution of the previously enlarged right-sided station 2 lymph  node in the neck with resolution of the abnormal right vallecular  activity in the neck. Currently no specific in findings of residual  lymphoma.       - 04/12/2014 Radiation Therapy    Dr. Lisbeth Renshaw      07/12/2014 Procedure    Port removed- Dr. Arnoldo Morale        Problem List Patient Active Problem List   Diagnosis Date Noted  . Depression [F32.9] 07/25/2015  . Knee pain, right [M25.561] 03/22/2015  . Iron deficiency [E61.1] 10/17/2013  . 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 Encounter for  therapeutic drug monitoring [Z51.81] 02/24/2013  . 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 Atrial fibrillation, chronic ([place]) [I48.2]   . Chronic anticoagulation [Z79.01]   . Tobacco abuse [Z72.0]   . Peptic ulcer disease [K27.9]   . Hypertension [I10]   . SPINAL STENOSIS [M48.00] 02/03/2008    Past Medical History Past Medical History:  Diagnosis Date  . Atrial fibrillation,  chronic ([place]) 2004   left atrial thrombus in 2004; near syncope in 2008; echocardiogram in 2008-normal EF, mild left atrial enlargement, no valvular abnormalities, lipomatous hypertrophy; no aortic atheroma on TEE in 2004  . Cancer ([facility])    LYMPH NODE  . Chronic anticoagulation 2004   warfarin  . Depression   . DJD (degenerative joint disease), lumbosacral    Also hip  . Dysrhythmia    chronic AFib  . Fracture of wrist    Left  . Hypertension    Normal carotid ultrasound in 2008  . Peptic ulcer disease    s/p Billroth II  . PONV (postoperative nausea and vomiting)   . Tobacco abuse    40 pack years; quit attempt in 2012    Past Surgical History Past Surgical History:  Procedure Laterality Date  . ABDOMINAL HYSTERECTOMY     w/o oophorectomy  . APPENDECTOMY    . CESAREAN SECTION    . CHOLECYSTECTOMY  2004   [facility]   patient denies ever having undergone colonoscopy as of 2014  . GASTROJEJUNOSTOMY  1984   Billroth II; splenectomy; incidental appendectomy  . KNEE ARTHROSCOPY WITH MEDIAL MENISECTOMY Right 02/26/2015   Procedure: KNEE ARTHROSCOPY WITH MEDIAL MENISECTOMY;  Surgeon: Sanjuana Kava, MD;  Location: [facility];  Service: Orthopedics;  Laterality: Right;  . LYMPH NODE BIOPSY Left 09/15/2013   Procedure: LEFT NECK CERVICAL NODE BIOPSY;  Surgeon: Scherry Ran, MD;  Location: [facility];  Service: General;  Laterality: Left;  . PORT-A-CATH REMOVAL Right 07/12/2014   Procedure: REMOVAL PORT-A-CATH;  Surgeon: Aviva Signs Md, MD;  Location: [facility];  Service: General;  Laterality: Right;  . PORTACATH PLACEMENT Right 10/21/2013   Procedure: ATTEMPTED INSERTION PORT-A-CATH;  Surgeon: Scherry Ran, MD;  Location: [facility];  Service: General;  Laterality: Right;  . SPLENECTOMY, TOTAL    . TONSILLECTOMY      Family History Family History  Problem Relation Age of Onset  . Heart attack Mother        also brother  . Breast cancer Sister   .  Heart attack Brother   . Breast cancer Sister        x2  . Cancer Brother        Gastric carcinoma; also father  . Cirrhosis Sister      Social History  reports that she has been smoking cigarettes.  She has a 13.75 pack-year smoking history. She has never used smokeless tobacco. She reports that she does not drink alcohol or use drugs.  Medications  Current Outpatient Medications:  .  acetaminophen (TYLENOL) 500 MG tablet, Take 1,000 mg by mouth daily as needed for headache., Disp: , Rfl:  .  ALPRAZolam (XANAX) 1 MG tablet, Take 1 tablet (1 mg total) by mouth 3 (three) times daily as needed for anxiety. (Patient taking differently: Take 1 mg by mouth daily. ), Disp: 30 tablet, Rfl: 0 .  calcium-vitamin D (OSCAL WITH D) 500-200 MG-UNIT tablet, Take 1 tablet by mouth daily with breakfast., Disp: 60 tablet, Rfl: 2 .  citalopram (CELEXA) 20 MG tablet, TAKE ONE (1) TABLET BY MOUTH EVERY DAY, Disp: 30 tablet, Rfl: 5 .  digoxin (LANOXIN) 0.125 MG tablet, Take 125 mcg by mouth daily. , Disp: , Rfl:  .  diltiazem (DILACOR XR) 120 MG 24 hr capsule, Take 120 mg by mouth daily., Disp: , Rfl:  .  fexofenadine (KP FEXOFENADINE HCL) 180 MG tablet, Take 180 mg by mouth daily., Disp: , Rfl:  .  metoprolol (TOPROL-XL) 25 MG 24 hr tablet, Take 12.5 mg by mouth 2 (two) times daily. , Disp: , Rfl:  .  Turmeric 500 MG TABS, Take 1 tablet by mouth 2 (two) times daily., Disp: , Rfl:  .  warfarin (COUMADIN) 5 MG tablet, TAKE ONE TABLET DAILY EXCEPT ONE-HALF TABLET ON MONDAYS AND THURSDAYS, Disp: 30 tablet, Rfl: 4  Allergies Diphenhydramine hcl; Estrogens; Penicillins; and Codeine  Review of Systems Review of Systems - Oncology ROS as per HPI otherwise 12 point ROS is negative.   Physical Exam  Vitals Wt Readings from Last 3 Encounters:  04/10/17 114 lb 12.8 oz (52.1 kg)  10/08/16 117 lb 9.6 oz (53.3 kg)  04/07/16 108 lb 8 oz (49.2 kg)   Temp Readings from Last 3 Encounters:  04/10/17 98.3 F (36.8  C) (Oral)  10/08/16 97.8 F (36.6 C) (Oral)  12/04/15 98.5 F (36.9 C) (Oral)   BP Readings from Last 3 Encounters:  04/10/17 (!) 152/77  10/08/16 (!) 147/65  04/07/16 (!) 155/90   Pulse Readings from Last 3 Encounters:  04/10/17 70  10/08/16 (!) 59  04/07/16 70   Constitutional: Well-developed, well-nourished, and in no distress.   HENT: Head: Normocephalic and atraumatic.  Mouth/Throat: No oropharyngeal exudate. Mucosa moist. Eyes: Pupils are equal, round, and reactive to light. Conjunctivae are normal. No scleral icterus.  Neck: Normal range of motion. Neck supple. No JVD present.  Cardiovascular: Normal rate, regular rhythm and normal heart sounds.  Exam reveals no gallop and no friction rub.   No murmur heard. Pulmonary/Chest: Effort normal and breath sounds normal. No respiratory distress. No wheezes.No rales.  Abdominal: Soft. Bowel sounds are normal. No distension. There is no tenderness. There is no guarding.  Musculoskeletal: No edema or tenderness.  Lymphadenopathy: No cervical, axillary or supraclavicular adenopathy.  Neurological: Alert and oriented to person, place, and time. No cranial nerve deficit.  Skin: Skin is warm and dry. No rash noted. No erythema. No pallor.  Psychiatric: Affect and judgment normal.   Labs No visits with results within 3 Day(s) from this visit.  Latest known visit with results is:  Appointment on 04/03/2017  Component Date Value Ref Range Status  . WBC 04/03/2017 8.4  4.0 - 10.5 K/uL Final  . RBC 04/03/2017 4.39  3.87 - 5.11 MIL/uL Final  . Hemoglobin 04/03/2017 12.1  12.0 - 15.0 g/dL Final  . HCT 04/03/2017 38.8  36.0 - 46.0 % Final  . MCV 04/03/2017 88.4  78.0 - 100.0 fL Final  . MCH 04/03/2017 27.6  26.0 - 34.0 pg Final  . MCHC 04/03/2017 31.2  30.0 - 36.0 g/dL Final  . RDW 04/03/2017 15.5  11.5 - 15.5 % Final  . Platelets 04/03/2017 369  150 - 400 K/uL Final  . Neutrophils Relative % 04/03/2017 59  % Final  . Neutro Abs  04/03/2017 5.0  1.7 - 7.7 K/uL Final  . Lymphocytes Relative 04/03/2017 29  % Final  . Lymphs Abs 04/03/2017 2.4  0.7 - 4.0 K/uL Final  . Monocytes Relative  04/03/2017 9  % Final  . Monocytes Absolute 04/03/2017 0.7  0.1 - 1.0 K/uL Final  . Eosinophils Relative 04/03/2017 2  % Final  . Eosinophils Absolute 04/03/2017 0.2  0.0 - 0.7 K/uL Final  . Basophils Relative 04/03/2017 1  % Final  . Basophils Absolute 04/03/2017 0.0  0.0 - 0.1 K/uL Final   Performed at [facility], [address], [place], [place] [address]  . Sodium 04/03/2017 137  135 - 145 mmol/L Final  . Potassium 04/03/2017 4.0  3.5 - 5.1 mmol/L Final  . Chloride 04/03/2017 101  101 - 111 mmol/L Final  . CO2 04/03/2017 25  22 - 32 mmol/L Final  . Glucose, Bld 04/03/2017 113* 65 - 99 mg/dL Final  . BUN 04/03/2017 12  6 - 20 mg/dL Final  . Creatinine, Ser 04/03/2017 0.67  0.44 - 1.00 mg/dL Final  . Calcium 04/03/2017 8.5* 8.9 - 10.3 mg/dL Final  . Total Protein 04/03/2017 7.0  6.5 - 8.1 g/dL Final  . Albumin 04/03/2017 3.6  3.5 - 5.0 g/dL Final  . AST 04/03/2017 26  15 - 41 U/L Final  . ALT 04/03/2017 21  14 - 54 U/L Final  . Alkaline Phosphatase 04/03/2017 71  38 - 126 U/L Final  . Total Bilirubin 04/03/2017 0.5  0.3 - 1.2 mg/dL Final  . GFR calc non Af Amer 04/03/2017 >60  >60 mL/min Final  . GFR calc Af Amer 04/03/2017 >60  >60 mL/min Final   Comment: (NOTE) The eGFR has been calculated using the CKD EPI equation. This calculation has not been validated in all clinical situations. eGFR's persistently <60 mL/min signify possible Chronic Kidney Disease.   Georgiann Hahn gap 04/03/2017 11  5 - 15 Final   Performed at [facility], [address], [place], [place] [address]  . LDH 04/03/2017 174  98 - 192 U/L Final   Performed at [facility], [address] [place],  [address]     Pathology Orders Placed This Encounter  Procedures  . CBC with Differential/Platelet    Standing Status:   Future    Standing Expiration  Date:   04/11/2018  . Comprehensive metabolic panel    Standing Status:   Future    Standing Expiration Date:   04/11/2018  . Lactate dehydrogenase    Standing Status:   Future    Standing Expiration Date:   04/11/2018  . Beta 2 microglobulin, serum    Standing Status:   Future    Standing Expiration Date:   04/11/2018  . Ambulatory Referral for Lung Cancer Screening    Referral Priority:   Routine    Referral Type:   Consultation    Referral Reason:   Specialty Services Required    Number of Visits Requested:   [place] MD

## 2017-05-18 ENCOUNTER — Ambulatory Visit (INDEPENDENT_AMBULATORY_CARE_PROVIDER_SITE_OTHER): Payer: Medicare Other | Admitting: *Deleted

## 2017-05-18 DIAGNOSIS — I482 Chronic atrial fibrillation, unspecified: Secondary | ICD-10-CM

## 2017-05-18 DIAGNOSIS — Z5181 Encounter for therapeutic drug level monitoring: Secondary | ICD-10-CM

## 2017-05-18 LAB — POCT INR: INR: 2.2

## 2017-05-18 NOTE — Patient Instructions (Signed)
Continue coumadin 1 tablet daily except 1/2 tablet on Mondays, Wednesdays and Fridays Recheck in 4 weeks 

## 2017-06-03 ENCOUNTER — Other Ambulatory Visit: Payer: Self-pay | Admitting: Cardiovascular Disease

## 2017-06-15 ENCOUNTER — Ambulatory Visit (INDEPENDENT_AMBULATORY_CARE_PROVIDER_SITE_OTHER): Payer: Medicare Other | Admitting: *Deleted

## 2017-06-15 DIAGNOSIS — I482 Chronic atrial fibrillation, unspecified: Secondary | ICD-10-CM

## 2017-06-15 DIAGNOSIS — Z5181 Encounter for therapeutic drug level monitoring: Secondary | ICD-10-CM

## 2017-06-15 LAB — POCT INR: INR: 2.5

## 2017-06-15 NOTE — Patient Instructions (Signed)
Continue coumadin 1 tablet daily except 1/2 tablet on Mondays, Wednesdays and Fridays Recheck in 4 weeks 

## 2017-06-29 DIAGNOSIS — Z6821 Body mass index (BMI) 21.0-21.9, adult: Secondary | ICD-10-CM | POA: Diagnosis not present

## 2017-06-29 DIAGNOSIS — I482 Chronic atrial fibrillation: Secondary | ICD-10-CM | POA: Diagnosis not present

## 2017-06-29 DIAGNOSIS — M25552 Pain in left hip: Secondary | ICD-10-CM | POA: Diagnosis not present

## 2017-06-29 DIAGNOSIS — M1612 Unilateral primary osteoarthritis, left hip: Secondary | ICD-10-CM | POA: Diagnosis not present

## 2017-06-29 DIAGNOSIS — M7062 Trochanteric bursitis, left hip: Secondary | ICD-10-CM | POA: Diagnosis not present

## 2017-06-29 DIAGNOSIS — L309 Dermatitis, unspecified: Secondary | ICD-10-CM | POA: Diagnosis not present

## 2017-06-29 DIAGNOSIS — E782 Mixed hyperlipidemia: Secondary | ICD-10-CM | POA: Diagnosis not present

## 2017-06-29 DIAGNOSIS — R21 Rash and other nonspecific skin eruption: Secondary | ICD-10-CM | POA: Diagnosis not present

## 2017-06-29 DIAGNOSIS — N921 Excessive and frequent menstruation with irregular cycle: Secondary | ICD-10-CM | POA: Diagnosis not present

## 2017-06-29 DIAGNOSIS — I10 Essential (primary) hypertension: Secondary | ICD-10-CM | POA: Diagnosis not present

## 2017-06-29 DIAGNOSIS — M159 Polyosteoarthritis, unspecified: Secondary | ICD-10-CM | POA: Diagnosis not present

## 2017-06-29 DIAGNOSIS — Z6822 Body mass index (BMI) 22.0-22.9, adult: Secondary | ICD-10-CM | POA: Diagnosis not present

## 2017-07-10 ENCOUNTER — Other Ambulatory Visit (HOSPITAL_COMMUNITY): Payer: Self-pay | Admitting: Adult Health

## 2017-07-10 DIAGNOSIS — C8580 Other specified types of non-Hodgkin lymphoma, unspecified site: Secondary | ICD-10-CM

## 2017-07-13 ENCOUNTER — Ambulatory Visit (INDEPENDENT_AMBULATORY_CARE_PROVIDER_SITE_OTHER): Payer: Medicare Other | Admitting: *Deleted

## 2017-07-13 DIAGNOSIS — Z5181 Encounter for therapeutic drug level monitoring: Secondary | ICD-10-CM | POA: Diagnosis not present

## 2017-07-13 DIAGNOSIS — I482 Chronic atrial fibrillation, unspecified: Secondary | ICD-10-CM

## 2017-07-13 LAB — POCT INR: INR: 3.8 — AB (ref 2.0–3.0)

## 2017-07-13 NOTE — Patient Instructions (Signed)
Hold coumadin tonight then resume 1 tablet daily except 1/2 tablet on Mondays, Wednesdays and Fridays. Recheck in 3 weeks.

## 2017-08-14 ENCOUNTER — Ambulatory Visit (INDEPENDENT_AMBULATORY_CARE_PROVIDER_SITE_OTHER): Payer: Medicare Other | Admitting: *Deleted

## 2017-08-14 DIAGNOSIS — Z5181 Encounter for therapeutic drug level monitoring: Secondary | ICD-10-CM

## 2017-08-14 DIAGNOSIS — I482 Chronic atrial fibrillation, unspecified: Secondary | ICD-10-CM

## 2017-08-14 LAB — POCT INR: INR: 2.4 (ref 2.0–3.0)

## 2017-08-14 NOTE — Patient Instructions (Signed)
Continue coumadin 1 tablet daily except 1/2 tablet on Mondays, Wednesdays and Fridays Recheck in 4 weeks 

## 2017-09-18 ENCOUNTER — Ambulatory Visit (INDEPENDENT_AMBULATORY_CARE_PROVIDER_SITE_OTHER): Payer: Medicare Other | Admitting: *Deleted

## 2017-09-18 DIAGNOSIS — I482 Chronic atrial fibrillation, unspecified: Secondary | ICD-10-CM

## 2017-09-18 DIAGNOSIS — Z5181 Encounter for therapeutic drug level monitoring: Secondary | ICD-10-CM

## 2017-09-18 LAB — POCT INR: INR: 2.3 (ref 2.0–3.0)

## 2017-09-18 NOTE — Patient Instructions (Signed)
Description   Continue coumadin 1 tablet daily except 1/2 tablet on Mondays, Wednesdays and Fridays. Recheck in 4 weeks.

## 2017-10-07 ENCOUNTER — Other Ambulatory Visit (HOSPITAL_COMMUNITY): Payer: Medicare Other

## 2017-10-12 ENCOUNTER — Ambulatory Visit (INDEPENDENT_AMBULATORY_CARE_PROVIDER_SITE_OTHER): Payer: Medicare Other | Admitting: *Deleted

## 2017-10-12 DIAGNOSIS — I482 Chronic atrial fibrillation, unspecified: Secondary | ICD-10-CM

## 2017-10-12 DIAGNOSIS — Z5181 Encounter for therapeutic drug level monitoring: Secondary | ICD-10-CM | POA: Diagnosis not present

## 2017-10-12 LAB — POCT INR: INR: 2 (ref 2.0–3.0)

## 2017-10-12 NOTE — Patient Instructions (Signed)
Take coumadin 1 tablet tonight then resume 1 tablet daily except 1/2 tablet on Mondays, Wednesdays and Fridays. Recheck in 4 weeks.

## 2017-10-14 ENCOUNTER — Ambulatory Visit (HOSPITAL_COMMUNITY): Payer: Medicare Other | Admitting: Internal Medicine

## 2017-10-20 ENCOUNTER — Inpatient Hospital Stay (HOSPITAL_COMMUNITY): Payer: Medicare Other | Attending: Hematology

## 2017-10-20 DIAGNOSIS — C8580 Other specified types of non-Hodgkin lymphoma, unspecified site: Secondary | ICD-10-CM | POA: Insufficient documentation

## 2017-10-20 LAB — COMPREHENSIVE METABOLIC PANEL
ALBUMIN: 3.7 g/dL (ref 3.5–5.0)
ALK PHOS: 64 U/L (ref 38–126)
ALT: 19 U/L (ref 0–44)
ANION GAP: 10 (ref 5–15)
AST: 24 U/L (ref 15–41)
BILIRUBIN TOTAL: 0.6 mg/dL (ref 0.3–1.2)
BUN: 9 mg/dL (ref 8–23)
CHLORIDE: 104 mmol/L (ref 98–111)
CO2: 26 mmol/L (ref 22–32)
CREATININE: 0.59 mg/dL (ref 0.44–1.00)
Calcium: 8.6 mg/dL — ABNORMAL LOW (ref 8.9–10.3)
GFR calc Af Amer: >60 mL/min (ref >60)
GFR calc non Af Amer: >60 mL/min (ref >60)
Glucose, Bld: 96 mg/dL (ref 70–99)
Potassium: 3.6 mmol/L (ref 3.5–5.1)
SODIUM: 140 mmol/L (ref 135–145)
Total Protein: 7 g/dL (ref 6.5–8.1)

## 2017-10-20 LAB — CBC WITH DIFFERENTIAL/PLATELET
Basophils Absolute: 0.1 10*3/uL (ref 0.0–0.1)
Basophils Relative: 1 %
Eosinophils Absolute: 0.3 10*3/uL (ref 0.0–0.7)
Eosinophils Relative: 3 %
HCT: 35.6 % — ABNORMAL LOW (ref 36.0–46.0)
Hemoglobin: 11.1 g/dL — ABNORMAL LOW (ref 12.0–15.0)
Lymphocytes Relative: 37 %
Lymphs Abs: 3.6 10*3/uL (ref 0.7–4.0)
MCH: 26.6 pg (ref 26.0–34.0)
MCHC: 31.2 g/dL (ref 30.0–36.0)
MCV: 85.4 fL (ref 78.0–100.0)
Monocytes Absolute: 1 10*3/uL (ref 0.1–1.0)
Monocytes Relative: 10 %
Neutro Abs: 4.9 10*3/uL (ref 1.7–7.7)
Neutrophils Relative %: 49 %
Platelets: 380 10*3/uL (ref 150–400)
RBC: 4.17 MIL/uL (ref 3.87–5.11)
RDW: 16.1 % — ABNORMAL HIGH (ref 11.5–15.5)
WBC: 9.9 10*3/uL (ref 4.0–10.5)

## 2017-10-20 LAB — LACTATE DEHYDROGENASE: LDH: 180 U/L (ref 98–192)

## 2017-10-21 LAB — BETA 2 MICROGLOBULIN, SERUM: BETA 2 MICROGLOBULIN: 1.6 mg/L (ref 0.6–2.4)

## 2017-10-27 ENCOUNTER — Ambulatory Visit (HOSPITAL_COMMUNITY): Payer: Medicare Other | Admitting: Internal Medicine

## 2017-11-04 ENCOUNTER — Other Ambulatory Visit: Payer: Self-pay

## 2017-11-04 ENCOUNTER — Inpatient Hospital Stay (HOSPITAL_COMMUNITY): Payer: Medicare Other | Attending: Hematology | Admitting: Internal Medicine

## 2017-11-04 ENCOUNTER — Encounter (HOSPITAL_COMMUNITY): Payer: Self-pay | Admitting: Internal Medicine

## 2017-11-04 VITALS — BP 120/80 | HR 62 | Resp 16 | Wt 118.0 lb

## 2017-11-04 DIAGNOSIS — C8580 Other specified types of non-Hodgkin lymphoma, unspecified site: Secondary | ICD-10-CM

## 2017-11-04 DIAGNOSIS — Z720 Tobacco use: Secondary | ICD-10-CM

## 2017-11-04 DIAGNOSIS — M7918 Myalgia, other site: Secondary | ICD-10-CM | POA: Diagnosis not present

## 2017-11-04 DIAGNOSIS — Z79899 Other long term (current) drug therapy: Secondary | ICD-10-CM | POA: Insufficient documentation

## 2017-11-04 DIAGNOSIS — F329 Major depressive disorder, single episode, unspecified: Secondary | ICD-10-CM | POA: Diagnosis not present

## 2017-11-04 DIAGNOSIS — C8330 Diffuse large B-cell lymphoma, unspecified site: Secondary | ICD-10-CM | POA: Insufficient documentation

## 2017-11-04 DIAGNOSIS — I482 Chronic atrial fibrillation, unspecified: Secondary | ICD-10-CM | POA: Diagnosis not present

## 2017-11-04 DIAGNOSIS — F17210 Nicotine dependence, cigarettes, uncomplicated: Secondary | ICD-10-CM | POA: Insufficient documentation

## 2017-11-04 DIAGNOSIS — I10 Essential (primary) hypertension: Secondary | ICD-10-CM | POA: Diagnosis not present

## 2017-11-04 DIAGNOSIS — R918 Other nonspecific abnormal finding of lung field: Secondary | ICD-10-CM | POA: Diagnosis not present

## 2017-11-04 DIAGNOSIS — Z7901 Long term (current) use of anticoagulants: Secondary | ICD-10-CM | POA: Insufficient documentation
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
## 2017-11-09 ENCOUNTER — Ambulatory Visit (INDEPENDENT_AMBULATORY_CARE_PROVIDER_SITE_OTHER): Payer: Medicare Other | Admitting: *Deleted

## 2017-11-09 DIAGNOSIS — Z5181 Encounter for therapeutic drug level monitoring: Secondary | ICD-10-CM

## 2017-11-09 DIAGNOSIS — I482 Chronic atrial fibrillation, unspecified: Secondary | ICD-10-CM | POA: Diagnosis not present

## 2017-11-09 LAB — POCT INR: INR: 2.2 (ref 2.0–3.0)

## 2017-11-09 NOTE — Patient Instructions (Signed)
Continue coumadin 1 tablet daily except 1/2 tablet on Mondays, Wednesdays and Fridays Recheck in 6 weeks 

## 2017-11-13 DIAGNOSIS — R21 Rash and other nonspecific skin eruption: Secondary | ICD-10-CM | POA: Diagnosis not present

## 2017-11-13 DIAGNOSIS — I482 Chronic atrial fibrillation, unspecified: Secondary | ICD-10-CM | POA: Diagnosis not present

## 2017-11-13 DIAGNOSIS — Z6821 Body mass index (BMI) 21.0-21.9, adult: Secondary | ICD-10-CM | POA: Diagnosis not present

## 2017-11-13 DIAGNOSIS — N921 Excessive and frequent menstruation with irregular cycle: Secondary | ICD-10-CM | POA: Diagnosis not present

## 2017-11-13 DIAGNOSIS — D649 Anemia, unspecified: Secondary | ICD-10-CM | POA: Diagnosis not present

## 2017-11-13 DIAGNOSIS — J069 Acute upper respiratory infection, unspecified: Secondary | ICD-10-CM | POA: Diagnosis not present

## 2017-11-13 DIAGNOSIS — Z8572 Personal history of non-Hodgkin lymphomas: Secondary | ICD-10-CM | POA: Diagnosis not present

## 2017-11-13 DIAGNOSIS — I10 Essential (primary) hypertension: Secondary | ICD-10-CM | POA: Diagnosis not present

## 2017-11-13 DIAGNOSIS — F411 Generalized anxiety disorder: Secondary | ICD-10-CM | POA: Diagnosis not present

## 2017-11-13 DIAGNOSIS — D509 Iron deficiency anemia, unspecified: Secondary | ICD-10-CM | POA: Diagnosis not present

## 2017-11-13 DIAGNOSIS — E559 Vitamin D deficiency, unspecified: Secondary | ICD-10-CM | POA: Diagnosis not present

## 2017-11-13 DIAGNOSIS — Z720 Tobacco use: Secondary | ICD-10-CM | POA: Diagnosis not present

## 2017-11-13 DIAGNOSIS — E782 Mixed hyperlipidemia: Secondary | ICD-10-CM | POA: Diagnosis not present

## 2017-11-13 DIAGNOSIS — M25552 Pain in left hip: Secondary | ICD-10-CM | POA: Diagnosis not present

## 2017-11-13 DIAGNOSIS — Z6822 Body mass index (BMI) 22.0-22.9, adult: Secondary | ICD-10-CM | POA: Diagnosis not present

## 2017-11-16 DIAGNOSIS — Z23 Encounter for immunization: Secondary | ICD-10-CM | POA: Diagnosis not present

## 2017-11-17 ENCOUNTER — Other Ambulatory Visit (HOSPITAL_COMMUNITY): Payer: Self-pay | Admitting: Internal Medicine

## 2017-11-17 DIAGNOSIS — Z780 Asymptomatic menopausal state: Secondary | ICD-10-CM

## 2017-11-20 ENCOUNTER — Ambulatory Visit (HOSPITAL_COMMUNITY)
Admission: RE | Admit: 2017-11-20 | Discharge: 2017-11-20 | Disposition: A | Discharge status: Home / Self Care | Payer: Medicare Other | Source: Ambulatory Visit | Attending: Internal Medicine | Admitting: Internal Medicine

## 2017-11-20 ENCOUNTER — Other Ambulatory Visit (HOSPITAL_COMMUNITY): Payer: Self-pay | Admitting: Internal Medicine

## 2017-11-20 DIAGNOSIS — M25461 Effusion, right knee: Secondary | ICD-10-CM | POA: Insufficient documentation

## 2017-11-20 DIAGNOSIS — M25561 Pain in right knee: Secondary | ICD-10-CM | POA: Diagnosis not present

## 2017-11-25 ENCOUNTER — Inpatient Hospital Stay (HOSPITAL_COMMUNITY): Admission: RE | Admit: 2017-11-25 | Payer: Medicare Other | Source: Ambulatory Visit

## 2017-11-30 ENCOUNTER — Ambulatory Visit (INDEPENDENT_AMBULATORY_CARE_PROVIDER_SITE_OTHER): Payer: Medicare Other | Admitting: Orthopedic Surgery

## 2017-11-30 ENCOUNTER — Encounter: Payer: Self-pay | Admitting: Orthopedic Surgery

## 2017-11-30 ENCOUNTER — Ambulatory Visit (INDEPENDENT_AMBULATORY_CARE_PROVIDER_SITE_OTHER): Payer: Medicare Other

## 2017-11-30 VITALS — BP 114/69 | HR 64 | Ht 60.0 in | Wt 116.0 lb

## 2017-11-30 DIAGNOSIS — M5416 Radiculopathy, lumbar region: Secondary | ICD-10-CM | POA: Diagnosis not present

## 2017-11-30 DIAGNOSIS — M1610 Unilateral primary osteoarthritis, unspecified hip: Secondary | ICD-10-CM | POA: Diagnosis not present

## 2017-11-30 NOTE — Patient Instructions (Addendum)
He will need a total hip on the left side.  First we will need a medical consultation and clearance from your medical doctor as well as the cardiologist  Once we get these office visits completed and they say is okay for surgery you can have your left hip replaced    Total Hip Replacement Total hip replacement is a surgery to replace your damaged hip joint. Your hip joint is replaced with a man-made (artificial) hip joint. This man-made hip joint is called a prosthesis. This surgery is done to lessen pain and improve movement. What happens before the procedure?  Do not eat or drink anything after midnight on the night before the procedure or as told by your doctor.  Ask your doctor about: ? Changing or stopping your normal medicines. This is important if you take diabetes medicines or blood thinners. ? Taking aspirin or ibuprofen medicines. These thin your blood. Do not take these medicines if your doctor tells you not to.  Plan to have someone take you home after the procedure.  Ask your health care team how your surgery site will be marked.  You may be given medicines that kill germs (antibiotics) to help prevent infection. What happens during the procedure?  To help prevent infection: ? Your health care team will wash or sanitize their hands. ? Your skin will be washed with soap.  An IV tube will be put into one of your veins.  You will be given one or more of the following: ? A medicine that makes you relaxed (sedative). ? A medicine that makes you fall asleep (general anesthetic). ? A medicine that numbs your body below the waist (spinal anesthetic).  A cut (incision) will be made in your hip. Your surgeon will take out any damaged parts of your hip joint.  Your surgeon will then: ? Put a man-made hip joint into your pelvic bone. Screws may be used to keep the hip joint in place. ? Take out the damaged ball of your thigh bone (femur). A man-made ball on a metal pole will  replace the damaged ball. ? The ball will be put into the new socket to make a new hip joint. Your hip joint will be checked to see if it moves as it should. ? Close the cut and place a bandage over it. What happens after the procedure?  You will stay in a recovery area until your medicines wear off.  Your nurse will monitor your vital signs. These include: ? Your pulse. ? Your blood pressure.  Once you are doing okay, you will be taken to your hospital room.  You may be told to take actions to help prevent blood clots. These may include: ? Walking soon after surgery with someone helping you. Moving around helps to improve blood flow. ? Taking medicines to thin your blood (anticoagulants). ? Wearing special socks (compression stockings) or using other types of devices.  You will do exercise therapy (physical therapy) until you are doing well. Your doctor will tell you when you are well enough to go home. This information is not intended to replace advice given to you by your health care provider. Make sure you discuss any questions you have with your health care provider. Document Released: 04/07/2011 Document Revised: 09/17/2015 Document Reviewed: 03/16/2013 Elsevier Interactive Patient Education  [employer].

## 2017-11-30 NOTE — Progress Notes (Signed)
NEW PATIENT OFFICE VISIT  Chief Complaint  Patient presents with  . Fall    last week  . Knee Pain    right   . Back Pain    left sided     77 year old female presents for evaluation of left-sided back pain right knee pain status post fall last week.  Most of her pain is located in her lower back and groin area she is had that for almost a year it increased after she fell it is a dull ache moderate to severe associated with painful limp   Review of Systems  Respiratory: Negative for shortness of breath.   Cardiovascular: Negative for chest pain and palpitations.  Musculoskeletal: Positive for back pain and joint pain.       Right knee pain swelling  All other systems reviewed and are negative.    Past Medical History:  Diagnosis Date  . Atrial fibrillation, chronic 2004   left atrial thrombus in 2004; near syncope in 2008; echocardiogram in 2008-normal EF, mild left atrial enlargement, no valvular abnormalities, lipomatous hypertrophy; no aortic atheroma on TEE in 2004  . Cancer (HCC)    LYMPH NODE  . Chronic anticoagulation 2004   warfarin  . Depression   . DJD (degenerative joint disease), lumbosacral    Also hip  . Dysrhythmia    chronic AFib  . Fracture of wrist    Left  . Hypertension    Normal carotid ultrasound in 2008  . Peptic ulcer disease    s/p Billroth II  . PONV (postoperative nausea and vomiting)   . Tobacco abuse    40 pack years; quit attempt in 2012    Past Surgical History:  Procedure Laterality Date  . ABDOMINAL HYSTERECTOMY     w/o oophorectomy  . APPENDECTOMY    . CESAREAN SECTION    . CHOLECYSTECTOMY  2004   [place]   patient denies ever having undergone colonoscopy as of 2014  . GASTROJEJUNOSTOMY  1984   Billroth II; splenectomy; incidental appendectomy  . KNEE ARTHROSCOPY WITH MEDIAL MENISECTOMY Right 02/26/2015   Procedure: KNEE ARTHROSCOPY WITH MEDIAL MENISECTOMY;  Surgeon: Sanjuana Kava, MD;   Location: [facility];  Service: Orthopedics;  Laterality: Right;  . LYMPH NODE BIOPSY Left 09/15/2013   Procedure: LEFT NECK CERVICAL NODE BIOPSY;  Surgeon: Scherry Ran, MD;  Location: [facility];  Service: General;  Laterality: Left;  . PORT-A-CATH REMOVAL Right 07/12/2014   Procedure: REMOVAL PORT-A-CATH;  Surgeon: Aviva Signs Md, MD;  Location: [facility];  Service: General;  Laterality: Right;  . PORTACATH PLACEMENT Right 10/21/2013   Procedure: ATTEMPTED INSERTION PORT-A-CATH;  Surgeon: Scherry Ran, MD;  Location: [facility];  Service: General;  Laterality: Right;  . SPLENECTOMY, TOTAL    . TONSILLECTOMY      Family History  Problem Relation Age of Onset  . Heart attack Mother        also brother  . Breast cancer Sister   . Heart attack Brother   . Breast cancer Sister        x2  . Cancer Brother        Gastric carcinoma; also father  . Cirrhosis Sister    Social History   Tobacco Use  . Smoking status: Current Every Day Smoker    Packs/day: 0.25    Years: 55.00    Pack years: 13.75    Types: Cigarettes  . Smokeless tobacco: Never Used  . Tobacco comment:  Quit attempt in 2012  Substance Use Topics  . Alcohol use: No  . Drug use: No    Allergies  Allergen Reactions  . Diphenhydramine Hcl Other (See Comments)    hallucinations   . Estrogens Other (See Comments)    edema  . Penicillins Other (See Comments)    Break out in whelps.  . Codeine Swelling and Rash    Current Meds  Medication Sig  . acetaminophen (TYLENOL) 500 MG tablet Take 1,000 mg by mouth daily as needed for headache.  . ALPRAZolam (XANAX) 1 MG tablet Take 1 tablet (1 mg total) by mouth 3 (three) times daily as needed for anxiety. (Patient taking differently: Take 1 mg by mouth daily. )  . calcium-vitamin D (OSCAL WITH D) 500-200 MG-UNIT tablet Take 1 tablet by mouth daily with breakfast.  . citalopram (CELEXA) 20 MG tablet TAKE ONE (1) TABLET BY MOUTH EVERY DAY  . digoxin (LANOXIN) 0.125 MG tablet  Take 125 mcg by mouth daily.   [name] diltiazem (DILACOR XR) 120 MG 24 hr capsule Take 120 mg by mouth daily.  . ferrous sulfate 325 (65 FE) MG tablet Take 325 mg by mouth daily with breakfast.  . fexofenadine (KP FEXOFENADINE HCL) 180 MG tablet Take 180 mg by mouth daily.  . metoprolol tartrate (LOPRESSOR) 25 MG tablet   . Turmeric 500 MG TABS Take 1 tablet by mouth 2 (two) times daily.  [name] warfarin (COUMADIN) 5 MG tablet Take 1 tablet daily except 1/2 tablet on Mondays, Wednesdays and Fridays    BP 114/69   Pulse 64   Ht 5' (1.524 m)   Wt 116 lb (52.6 kg)   BMI 22.65 kg/m   Physical Exam Small frame ectomorphic body habitus normal development nutrition grooming hygiene.  Distal peripheral vascular system no swelling or varicosities normal pulses and temperature no edema no tenderness Groin area both sides negative for lymphadenopathy  Skin all 4 extremities normal   Ortho Exam  She can do well with finger-to-nose testing but not well with heel-to-shin due to the left hip arthritis  Negative nerve stretch test bilaterally normal deep tendon reflexes no sensory deficit  Oriented x3 mood affect normal  Walks with a cane favors the left side leg length discrepancy appears to be left shorter than right  Right upper extremity left upper extremity normal alignment no asymmetry crepitation defects tenderness mass or effusion full range of motion without crepitation or contracture joints stable without laxity muscle strength normal no tremor  Right lower extremity no malalignment tenderness over the medial joint line of the knee but full range of motion without effusion knee is stable muscle strength and tone normal  Left hip Stinchfield test negative leg is short crepitation on flexion and internal rotation of the hip internal rotation deficit 0 degrees internal rotation left hip normal flexion decreased abduction and when he 5 degrees external rotation no dislocation subluxation or laxity  muscle strength and tone normal   MEDICAL DECISION SECTION  Xrays were done at 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  . Lumbar back pain with radiculopathy affecting right lower extremity Yes    PLAN: (Rx., injectx, surgery, frx, mri/ct) The patient will need a total hip on the left side  We will need to get a medical consult and a cardiology consult first as she has a history of atrial fibrillation and warfarin  Left Total hip The procedure has been fully reviewed with the patient; The risks and benefits of surgery have been discussed and explained and understood. Alternative treatment has also been reviewed, questions were encouraged and answered. The postoperative plan is also been reviewed.   No orders of the defined types were placed in this encounter.   Arther Abbott, MD  11/30/2017 9:41 AM

## 2017-12-04 DIAGNOSIS — M159 Polyosteoarthritis, unspecified: Secondary | ICD-10-CM | POA: Diagnosis not present

## 2017-12-04 DIAGNOSIS — M25552 Pain in left hip: Secondary | ICD-10-CM | POA: Diagnosis not present

## 2017-12-11 DIAGNOSIS — Z6821 Body mass index (BMI) 21.0-21.9, adult: Secondary | ICD-10-CM | POA: Diagnosis not present

## 2017-12-11 DIAGNOSIS — I482 Chronic atrial fibrillation, unspecified: Secondary | ICD-10-CM | POA: Diagnosis not present

## 2017-12-11 DIAGNOSIS — Z23 Encounter for immunization: Secondary | ICD-10-CM | POA: Diagnosis not present

## 2017-12-11 DIAGNOSIS — M1610 Unilateral primary osteoarthritis, unspecified hip: Secondary | ICD-10-CM | POA: Diagnosis not present

## 2017-12-11 DIAGNOSIS — F17218 Nicotine dependence, cigarettes, with other nicotine-induced disorders: Secondary | ICD-10-CM | POA: Diagnosis not present

## 2017-12-11 DIAGNOSIS — Z0001 Encounter for general adult medical examination with abnormal findings: Secondary | ICD-10-CM | POA: Diagnosis not present

## 2017-12-11 DIAGNOSIS — E559 Vitamin D deficiency, unspecified: Secondary | ICD-10-CM | POA: Diagnosis not present

## 2017-12-11 DIAGNOSIS — D509 Iron deficiency anemia, unspecified: Secondary | ICD-10-CM | POA: Diagnosis not present

## 2017-12-11 DIAGNOSIS — Z7901 Long term (current) use of anticoagulants: Secondary | ICD-10-CM | POA: Diagnosis not present

## 2017-12-11 DIAGNOSIS — F411 Generalized anxiety disorder: Secondary | ICD-10-CM | POA: Diagnosis not present

## 2017-12-11 DIAGNOSIS — M5416 Radiculopathy, lumbar region: Secondary | ICD-10-CM | POA: Diagnosis not present

## 2017-12-11 DIAGNOSIS — I10 Essential (primary) hypertension: Secondary | ICD-10-CM | POA: Diagnosis not present

## 2017-12-14 ENCOUNTER — Other Ambulatory Visit: Payer: Self-pay | Admitting: Cardiovascular Disease

## 2017-12-16 ENCOUNTER — Other Ambulatory Visit: Payer: Self-pay

## 2017-12-21 ENCOUNTER — Ambulatory Visit (INDEPENDENT_AMBULATORY_CARE_PROVIDER_SITE_OTHER): Payer: Medicare Other | Admitting: *Deleted

## 2017-12-21 DIAGNOSIS — Z5181 Encounter for therapeutic drug level monitoring: Secondary | ICD-10-CM

## 2017-12-21 DIAGNOSIS — I482 Chronic atrial fibrillation, unspecified: Secondary | ICD-10-CM | POA: Diagnosis not present

## 2017-12-21 LAB — POCT INR: INR: 2.1 (ref 2.0–3.0)

## 2017-12-21 NOTE — Patient Instructions (Signed)
Continue coumadin 1 tablet daily except 1/2 tablet on Mondays, Wednesdays and Fridays Recheck in 6 weeks 

## 2018-01-05 ENCOUNTER — Encounter (HOSPITAL_COMMUNITY): Payer: Self-pay

## 2018-01-05 ENCOUNTER — Other Ambulatory Visit (HOSPITAL_COMMUNITY): Payer: Medicare Other

## 2018-01-06 ENCOUNTER — Ambulatory Visit (INDEPENDENT_AMBULATORY_CARE_PROVIDER_SITE_OTHER): Payer: Medicare Other | Admitting: Cardiovascular Disease

## 2018-01-06 ENCOUNTER — Encounter: Payer: Self-pay | Admitting: Cardiovascular Disease

## 2018-01-06 VITALS — BP 140/86 | HR 69 | Ht 60.0 in | Wt 113.0 lb

## 2018-01-06 DIAGNOSIS — I10 Essential (primary) hypertension: Secondary | ICD-10-CM

## 2018-01-06 DIAGNOSIS — Z01818 Encounter for other preprocedural examination: Secondary | ICD-10-CM

## 2018-01-06 DIAGNOSIS — Z7901 Long term (current) use of anticoagulants: Secondary | ICD-10-CM | POA: Diagnosis not present

## 2018-01-06 DIAGNOSIS — R0602 Shortness of breath: Secondary | ICD-10-CM

## 2018-01-06 DIAGNOSIS — I4821 Permanent atrial fibrillation: Secondary | ICD-10-CM

## 2018-01-06 MED ORDER — DILTIAZEM HCL ER COATED BEADS 240 MG PO CP24: 240.0000 mg | ORAL_CAPSULE | Freq: Every day | ORAL | 3 refills | Status: DC

## 2018-01-06 NOTE — Patient Instructions (Addendum)
Medication Instructions:  STOP LOPRESSOR  STOP DIGOXIN  INCREASE CARTIA TO 240 MG DAILY    Labwork: NONE  Testing/Procedures: Your physician has requested that you have a lexiscan myoview. For further information please visit [email or web address]. Please follow instruction sheet, as given.    Follow-Up: Your physician wants you to follow-up in: 6 MONTHS.  You will receive a reminder letter in the mail two months in advance. If you don't receive a letter, please call our office to schedule the follow-up appointment.   Any Other Special Instructions Will Be Listed Below (If Applicable).     If you need a refill on your cardiac medications before your next appointment, please call your pharmacy.

## 2018-01-06 NOTE — Progress Notes (Signed)
CARDIOLOGY CONSULT NOTE  Patient ID: Marissa Turner MRN: [id number] DOB/AGE: [birth year] 77 y.o.  Admit date: (Not on file) Primary Physician: Celene Squibb, MD Referring Physician: Celene Squibb, MD  Reason for Consultation: Preoperative risk stratification, atrial fibrillation  HPI: Marissa Turner is a 77 y.o. female who is being seen today for the evaluation of atrial fibrillation and preoperative risk stratification at the request of Celene Squibb, MD.   I last saw her on 11/30/2014 and she was lost to follow-up afterwards.  She saw Dr. Aline Brochure who plans to perform left total hip replacement as she has severe degenerative arthritis which may be secondary to avascular necrosis.  I reviewed labs and notes from her PCP.  Labs on 11/13/2017 showed hemoglobin 11.4, platelets 427, BUN 9, creatinine 0.58, sodium 142, potassium 4.3.  There are no recent stress tests or echocardiograms on file.  There are no recent ECGs on file either.  She walks with a cane.  She told me "I do not want to undergo a hip replacement if I do not have to but if the doctor says I need 1 I guess I will do it ".  She denies chest pain, palpitations, leg swelling, and shortness of breath.  She does not get any exercise.  She said she is very active playing with her 5 great-grandchildren.  ECG performed in the office today which I ordered and personally interpreted demonstrates rate controlled atrial fibrillation, 68 bpm.   Allergies  Allergen Reactions  . Diphenhydramine Hcl Other (See Comments)    hallucinations   . Estrogens Other (See Comments)    edema  . Penicillins Other (See Comments)    Break out in whelps.  . Codeine Swelling and Rash    Current Outpatient Medications  Medication Sig Dispense Refill  . acetaminophen (TYLENOL) 500 MG tablet Take 1,000 mg by mouth daily as needed for headache.    . ALPRAZolam (XANAX) 1 MG tablet Take 1 tablet (1 mg total) by mouth 3 (three) times daily  as needed for anxiety. (Patient taking differently: Take 1 mg by mouth daily. ) 30 tablet 0  . calcium-vitamin D (OSCAL WITH D) 500-200 MG-UNIT tablet Take 1 tablet by mouth daily with breakfast. 60 tablet 2  . CARTIA XT 120 MG 24 hr capsule     . citalopram (CELEXA) 20 MG tablet TAKE ONE (1) TABLET BY MOUTH EVERY DAY 30 tablet 5  . digoxin (LANOXIN) 0.125 MG tablet Take 125 mcg by mouth daily.     [name] diltiazem (DILACOR XR) 120 MG 24 hr capsule Take 120 mg by mouth daily.    . ferrous sulfate 325 (65 FE) MG tablet Take 325 mg by mouth daily with breakfast.    . fexofenadine (KP FEXOFENADINE HCL) 180 MG tablet Take 180 mg by mouth daily.    . metoprolol tartrate (LOPRESSOR) 25 MG tablet     . traMADol (ULTRAM) 50 MG tablet     . Turmeric 500 MG TABS Take 1 tablet by mouth 2 (two) times daily.    [name] warfarin (COUMADIN) 5 MG tablet TAKE ONE TABLELT ONCE DAILY EXCEPT TAKE 1/2 TABLET ON MONDAY, WEDNESDAY ANDFRIDAY 30 tablet 4   No current facility-administered medications for this visit.     Past Medical History:  Diagnosis Date  . Atrial fibrillation, chronic 2004   left atrial thrombus in 2004; near syncope in 2008; echocardiogram in 2008-normal EF, mild left atrial enlargement, no valvular  abnormalities, lipomatous hypertrophy; no aortic atheroma on TEE in 2004  . Cancer (HCC)    LYMPH NODE  . Chronic anticoagulation 2004   warfarin  . Depression   . DJD (degenerative joint disease), lumbosacral    Also hip  . Dysrhythmia    chronic AFib  . Fracture of wrist    Left  . Hypertension    Normal carotid ultrasound in 2008  . Peptic ulcer disease    s/p Billroth II  . PONV (postoperative nausea and vomiting)   . Tobacco abuse    40 pack years; quit attempt in 2012    Past Surgical History:  Procedure Laterality Date  . ABDOMINAL HYSTERECTOMY     w/o oophorectomy  . APPENDECTOMY    . CESAREAN SECTION    . CHOLECYSTECTOMY  2004   [place]   patient  denies ever having undergone colonoscopy as of 2014  . GASTROJEJUNOSTOMY  1984   Billroth II; splenectomy; incidental appendectomy  . KNEE ARTHROSCOPY WITH MEDIAL MENISECTOMY Right 02/26/2015   Procedure: KNEE ARTHROSCOPY WITH MEDIAL MENISECTOMY;  Surgeon: Sanjuana Kava, MD;  Location: [facility];  Service: Orthopedics;  Laterality: Right;  . LYMPH NODE BIOPSY Left 09/15/2013   Procedure: LEFT NECK CERVICAL NODE BIOPSY;  Surgeon: Scherry Ran, MD;  Location: [facility];  Service: General;  Laterality: Left;  . PORT-A-CATH REMOVAL Right 07/12/2014   Procedure: REMOVAL PORT-A-CATH;  Surgeon: Aviva Signs Md, MD;  Location: [facility];  Service: General;  Laterality: Right;  . PORTACATH PLACEMENT Right 10/21/2013   Procedure: ATTEMPTED INSERTION PORT-A-CATH;  Surgeon: Scherry Ran, MD;  Location: [facility];  Service: General;  Laterality: Right;  . SPLENECTOMY, TOTAL    . TONSILLECTOMY      Social History   Socioeconomic History  . Marital status: Married    Spouse name: Not on file  . Number of children: 2  . Years of education: Not on file  . Highest education level: Not on file  Occupational History  . Occupation: Housecleaning  Social Needs  . Financial resource strain: Not on file  . Food insecurity:    Worry: Not on file    Inability: Not on file  . Transportation needs:    Medical: Not on file    Non-medical: Not on file  Tobacco Use  . Smoking status: Current Every Day Smoker    Packs/day: 0.50    Years: 55.00    Pack years: 27.50    Types: Cigarettes  . Smokeless tobacco: Never Used  . Tobacco comment: Quit attempt in 2012  Substance and Sexual Activity  . Alcohol use: No  . Drug use: No  . Sexual activity: Yes    Birth control/protection: Surgical  Lifestyle  . Physical activity:    Days per week: Not on file    Minutes per session: Not on file  . Stress: Not on file  Relationships  . Social connections:    Talks on phone: Not on file    Gets together: Not on  file    Attends religious service: Not on file    Active member of club or organization: Not on file    Attends meetings of clubs or organizations: Not on file    Relationship status: Not on file  . Intimate partner violence:    Fear of current or ex partner: Not on file    Emotionally abused: Not on file    Physically abused: Not on file  Forced sexual activity: Not on file  Other Topics Concern  . Not on file  Social History Narrative   Married with one child and one adopted child, 1 stillbirth, and one deceased child at age 64 due to illness   No regular exercise     No family history of premature CAD in 1st degree relatives.  No outpatient medications have been marked as taking for the 01/06/18 encounter (Office Visit) with Herminio Commons, MD.      Review of systems complete and found to be negative unless listed above in HPI    Physical exam Blood pressure 140/86, pulse 69, height 5' (1.524 m), weight 113 lb (51.3 kg), SpO2 94 %. General: NAD Neck: No JVD, no thyromegaly or thyroid nodule.  Lungs: Clear to auscultation bilaterally with normal respiratory effort. CV: Nondisplaced PMI. Regular rate and irregular rhythm, normal S1/S2, no S3, no murmur.  No peripheral edema.  No carotid bruit.    Abdomen: Soft, nontender, no distention.  Skin: Intact without lesions or rashes.  Neurologic: Alert and oriented x 3.  Psych: Normal affect. Extremities: No clubbing or cyanosis.  HEENT: Normal.   ECG: Most recent ECG reviewed.   Labs: Lab Results  Component Value Date/Time   K 3.6 10/20/2017 12:34 PM   BUN 9 10/20/2017 12:34 PM   CREATININE 0.59 10/20/2017 12:34 PM   CREATININE 0.65 06/30/2013 08:40 AM   ALT 19 10/20/2017 12:34 PM   HGB 11.1 (L) 10/20/2017 12:34 PM     Lipids: Lab Results  Component Value Date/Time   LDLCALC 107 (H) 06/30/2013 08:40 AM   CHOL 179 06/30/2013 08:40 AM   TRIG 173 (H) 06/30/2013 08:40 AM   HDL 37 (L) 06/30/2013 08:40 AM         ASSESSMENT AND PLAN:   1.  Permanent atrial fibrillation: Symptomatically stable.  In order to consolidate medical therapy, I will increase long-acting diltiazem to 240 mg daily.  I will stop digoxin and Lopressor.  She was only taking Lopressor 25 mg once daily.  She is anticoagulated with warfarin.  2.  Preoperative risk stratification: I am unable to assess exercise tolerance as she does not get any meaningful activity and walks with a cane. I will proceed with a nuclear myocardial perfusion imaging study to evaluate for ischemic heart disease (Lexiscan Myoview).  I will be better able to assess perioperative risk once completed.  3.  Hypertension: Blood pressure is mildly elevated.  I will monitor given medication adjustments noted above.    Disposition: Follow up in 6 months  Signed: Kate Sable, M.D., F.A.C.C.  01/06/2018, 1:09 PM

## 2018-01-12 ENCOUNTER — Encounter (HOSPITAL_COMMUNITY)
Admission: RE | Admit: 2018-01-12 | Discharge: 2018-01-12 | Disposition: A | Discharge status: Home / Self Care | Payer: Medicare Other | Source: Ambulatory Visit | Attending: Cardiovascular Disease | Admitting: Cardiovascular Disease

## 2018-01-12 ENCOUNTER — Encounter (HOSPITAL_BASED_OUTPATIENT_CLINIC_OR_DEPARTMENT_OTHER)
Admission: RE | Admit: 2018-01-12 | Discharge: 2018-01-12 | Disposition: A | Discharge status: Home / Self Care | Payer: Medicare Other | Source: Ambulatory Visit | Attending: Cardiovascular Disease | Admitting: Cardiovascular Disease

## 2018-01-12 ENCOUNTER — Encounter (HOSPITAL_COMMUNITY): Payer: Self-pay

## 2018-01-12 DIAGNOSIS — R0602 Shortness of breath: Secondary | ICD-10-CM | POA: Diagnosis not present

## 2018-01-12 LAB — NM MYOCAR MULTI W/SPECT W/WALL MOTION / EF
CHL CUP RESTING HR STRESS: 56 {beats}/min
LV dias vol: 88 mL (ref 46–106)
LV sys vol: 40 mL
Peak HR: 73 {beats}/min
RATE: 0.2
SDS: 3
SRS: 3
SSS: 6
TID: 0.98

## 2018-01-12 MED ORDER — REGADENOSON 0.4 MG/5ML IV SOLN
INTRAVENOUS | Status: AC
  Administered 2018-01-12: 0.4 mg via INTRAVENOUS
  Filled 2018-01-12: qty 5

## 2018-01-12 MED ORDER — SODIUM CHLORIDE 0.9% FLUSH
INTRAVENOUS | Status: AC
  Administered 2018-01-12: 10 mL via INTRAVENOUS
  Filled 2018-01-12: qty 10

## 2018-01-12 MED ORDER — TECHNETIUM TC 99M TETROFOSMIN IV KIT
10.0000 | PACK | Freq: Once | INTRAVENOUS | Status: AC | PRN
  Administered 2018-01-12: 9.1 via INTRAVENOUS

## 2018-01-12 MED ORDER — TECHNETIUM TC 99M TETROFOSMIN IV KIT
30.0000 | PACK | Freq: Once | INTRAVENOUS | Status: AC | PRN
  Administered 2018-01-12: 27 via INTRAVENOUS

## 2018-01-13 ENCOUNTER — Telehealth: Payer: Self-pay

## 2018-01-13 NOTE — Telephone Encounter (Signed)
Called pt. No answer, left message for pt to return call.  

## 2018-01-13 NOTE — Telephone Encounter (Signed)
-----   Message from Laurine Blazer, LPN sent at [id number]/02/4095  9:40 AM EST -----  ----- Message ----- From: Herminio Commons, MD Sent: 01/13/2018   9:38 AM EST To: Laurine Blazer, LPN  Low risk study overall.  Some suggestion of possible prior heart attack but pumping function of heart is normal.  The pictures were also affected by some shadowing from abdominal organs.  She can proceed with surgery as planned.

## 2018-02-01 ENCOUNTER — Ambulatory Visit (INDEPENDENT_AMBULATORY_CARE_PROVIDER_SITE_OTHER): Payer: Medicare Other | Admitting: Pharmacist

## 2018-02-01 DIAGNOSIS — I482 Chronic atrial fibrillation, unspecified: Secondary | ICD-10-CM | POA: Diagnosis not present

## 2018-02-01 DIAGNOSIS — Z5181 Encounter for therapeutic drug level monitoring: Secondary | ICD-10-CM

## 2018-02-01 LAB — POCT INR: INR: 1.8 — AB (ref 2.0–3.0)

## 2018-02-01 NOTE — Patient Instructions (Addendum)
Description   Take 1 tablet today then continue coumadin 1 tablet daily except 1/2 tablet on Mondays, Wednesdays and Fridays. Recheck in 4 weeks. Call once procedure scheduled.

## 2018-02-05 DIAGNOSIS — Z0000 Encounter for general adult medical examination without abnormal findings: Secondary | ICD-10-CM | POA: Diagnosis not present

## 2018-02-09 DIAGNOSIS — Z1212 Encounter for screening for malignant neoplasm of rectum: Secondary | ICD-10-CM | POA: Diagnosis not present

## 2018-02-09 DIAGNOSIS — Z1211 Encounter for screening for malignant neoplasm of colon: Secondary | ICD-10-CM | POA: Diagnosis not present

## 2018-02-09 LAB — COLOGUARD

## 2018-02-16 ENCOUNTER — Other Ambulatory Visit (HOSPITAL_COMMUNITY): Payer: Self-pay | Admitting: Internal Medicine

## 2018-02-23 ENCOUNTER — Encounter: Payer: Self-pay | Admitting: Orthopedic Surgery

## 2018-02-23 ENCOUNTER — Ambulatory Visit (INDEPENDENT_AMBULATORY_CARE_PROVIDER_SITE_OTHER): Payer: Medicare Other | Admitting: Orthopedic Surgery

## 2018-02-23 VITALS — BP 88/45 | HR 76 | Ht 60.0 in | Wt 113.0 lb

## 2018-02-23 DIAGNOSIS — M1610 Unilateral primary osteoarthritis, unspecified hip: Secondary | ICD-10-CM | POA: Diagnosis not present

## 2018-02-23 NOTE — Patient Instructions (Addendum)
1.  Stop smoking #2 you have high risk for surgery because of atrial fibrillation you are on Coumadin you have a higher bleeding risk higher stroke risk   Preparing for Hip Replacement Getting prepared before hip replacement surgery can make your recovery easier and more comfortable. This document provides some tips and guidelines that will help you prepare for your surgery. Talk with your health care provider so you can learn what to expect before, during, and after surgery. Ask questions if you do not understand something. To ease concerns about your financial responsibilities, call your insurance company as soon as you decide to have surgery. Ask how much of your surgery and hospital stay will be covered. Also ask about coverage for medical equipment, rehabilitation facilities, and home care. How should I arrange for help? In the first couple weeks after surgery, it will likely be harder for you to do some of your regular activities. You may get tired easily, and you may have limited movement in your leg. Follow these guidelines to make sure you have all the help you need after your surgery:  Plan to have someone take you home from the hospital. Your health care provider will tell you how many days you can expect to be in the hospital.  Cancel all your work, caregiving, and volunteer responsibilities for at least 4-6 weeks after surgery.  Plan to have someone stay with you day and night for the first week. This person should be someone you are comfortable with. You may need this person to help you with your exercises and personal care, such as bathing and using the toilet.  If you live alone, arrange for someone to take care of your home and pets for the first 4-6 weeks after surgery.  Arrange for drivers to take you to and from follow-up appointments, the grocery store, and other places you may need to go for at least 4-6 weeks.  Consider applying for a disabled parking permit. To get an  application, contact the Department of Motor Vehicles or your health care provider's office. How should I prepare my home?      Pick a recovery spot, but do not plan on recovering in bed. Sitting more upright is better for your health. You may want to use a recliner with a small table nearby. Choose a chair with a firm seat that will not allow you to sink down into it. Chairs and sofas that are too soft can allow your hip to bend at an angle greater than 90 degrees. This could put you at risk for dislocating your new hip joint.  Place the items you use most frequently on the small table next to your chair. These items may include the TV remote, a cordless phone, a cell phone, a book or laptop computer, and a water glass.  To see if you will be able to move around in your home with a walker, hold your hands out about 6 inches (15 cm) from your sides, and walk from your recovery spot to your kitchen and bathroom. Then walk from your bed to the bathroom. If you do not hit anything with your hands, you will have enough room for a walker.  Minimize the use of stairs after you return home to reduce your risk of falling or tripping.  Remove all clutter from your floors. Also remove any throw rugs. This will help you avoid tripping after your surgery.  Move the items you use most often in your kitchen,  bathroom, and bedroom to shelves and drawers that are at countertop height.  Prepare a few meals to freeze and reheat later.  Consider getting safety equipment that will be helpful during your recovery, such as: ? Grab bars added in the shower and near the toilet. ? A raised toilet seat to help you get on and off the toilet more easily. ? A tub or shower bench. How should I prepare my body?  Have a preoperative exam. ? During the exam, your health care provider will make sure that your body is healthy enough to safely have the surgery. ? When you go to the exam, bring a complete list of all your  medicines and supplements, including herbs and vitamins. ? You may need to have additional tests to ensure your safety.  Have elective dental care and routine cleanings done before your surgery. Germs from anywhere in your body, including your mouth, can travel to your new joint and infect it. It is important that you do not have any dental work done for at least 3 months after your surgery.  Maintain a healthy diet. Do not change your diet before surgery unless your health care provider tells you to do that.  Do not use any products that contain nicotine or tobacco, such as cigarettes and e-cigarettes. These can delay bone healing after surgery. If you need help quitting, ask your health care provider.  Tell your health care provider if: ? You develop any skin infections or skin irritations. You may need to improve the condition of your skin before surgery. ? You have a fever, a cold, or any other illness in the week before your surgery.  Do not drink any alcohol for at least 48 hours before surgery.  The day before your surgery, follow instructions from your health care provider about showering, eating, drinking, and taking medicines. These directions are for your safety.  Talk to your health care provider about doing exercises before your surgery. ? Be sure to follow the exercise program only as directed by your health care provider. ? Doing these exercises in the weeks before your surgery may help reduce pain and improve function after surgery. Summary  Getting prepared before hip replacement surgery can make your recovery easier and more comfortable.  Prepare your home and arrange for help at home.  Keep all of your preoperative appointments to ensure that you are ready for your surgery.  Plan to have someone take you home from the hospital and stay with you day and night for the first week. This information is not intended to replace advice given to you by your health care  provider. Make sure you discuss any questions you have with your health care provider. Document Released: 04/19/2010 Document Revised: 03/02/2017 Document Reviewed: 03/02/2017 Elsevier Interactive Patient Education  2019 [employer]  Total Hip Replacement  Total hip replacement is a surgery to replace your damaged hip joint. Your hip joint is replaced with a man-made (artificial) hip joint. This man-made hip joint is called a prosthesis. This surgery is done to lessen pain and to help your hip move better. What happens before the procedure? Staying hydrated Follow instructions from your doctor about drinking fluids. This may include:  Up to 2 hours before surgery - you may keep drinking clear liquids. These include: ? Water. ? Clear fruit juice. ? Black coffee. ? Plain tea. Eating and drinking restrictions Follow instructions from your doctor about eating and drinking. These may include:  8 hours before  surgery - stop eating heavy meals or foods. These include meat, fried foods, and fatty foods.  6 hours before surgery - stop eating light meals or foods. These include toast and cereal.  6 hours before surgery - stop drinking milk or drinks that have milk in them.  2 hours before surgery - stop drinking clear liquids. Medicines Ask your doctor about:  Changing or stopping your normal medicines. This is important if you take diabetes medicines or blood thinners.  Taking medicines such as aspirin and ibuprofen. These can thin your blood. Do not take these medicines unless your doctor tells you to take them.  Taking over-the-counter medicines, vitamins, herbs, and supplements. General instructions  You may have a physical exam.  You may have tests, such as: ? X-rays or MRI. ? Blood or urine tests.  Plan to have someone take you home.  Plan to have someone you trust take care of you for at least 24 hours after you leave the hospital or clinic. This is important.  Prepare  your home so you can be safe and have easy access to what you need.  Have teeth cleanings or any dental work done weeks before your surgery, or wait until a few weeks after your surgery.  Avoid shaving your legs just before surgery. If any shaving is needed, it will be done in the hospital.  Ask your doctor how your surgical site will be marked or identified. What happens during the procedure?  To lower your risk of infection: ? Your health care team will wash or sanitize their hands. ? Hair may be removed from the surgical area. ? Your skin will be washed with soap.  An IV tube will be put into one of your veins.  You will be given one or more of the following: ? A medicine to help you relax (sedative). ? A medicine to make you fall asleep (general anesthetic). ? A medicine to numb your body below the waist (spinal anesthetic).  Your doctor will make a cut (incision) in your hip. The place where the cut is made will depend on the approach used by the doctor: ? Posterior approach. The cut will be at the back of the hip. ? Anterior approach. The cut will be at the front of the hip.  Then, your doctor will: ? Use his or her hands to move your hip out of position (dislocate it). ? Cut and take out damaged parts of your hip joint. ? Put a man-made hip joint into place. ? Do an X-ray of the hip joint to make sure it is in the right place. ? Place a drain to remove extra fluid, if needed. ? Close the cut and place a bandage (dressing) over it. The procedure may vary among doctors and hospitals. What happens after the procedure?  Your health care team will: ? Monitor you until you leave the hospital. ? Check your blood pressure, heart rate, breathing rate, and blood oxygen level. ? Check if you can move your foot and can feel sensations in it. ? Give you pain medicine.  Your doctor will tell you to take actions to help prevent blood clots and reduce swelling in your legs. You may  need to: ? Wear a type of socks that are tight (compression stockings). ? Take medicines to thin your blood (anticoagulants).  You will do exercises (physical therapy) until you are doing well. Your doctor will tell you when you are well enough to go home.  You may need to use a walker or crutches.  You may need to use a wedge pillow (hip abduction pillow) when you are in bed. This pillow will keep your legs from turning in ways that may cause your new hip joint to move out of place. Summary  Total hip replacement is a surgery to replace your damaged hip joint. Your hip joint is replaced with a man-made (artificial) hip joint.  Follow instructions from your doctor about eating and drinking before the procedure.  Plan to have someone take you home from the hospital.  You may need to use a walker or crutches after surgery. This information is not intended to replace advice given to you by your health care provider. Make sure you discuss any questions you have with your health care provider. Document Released: 04/07/2011 Document Revised: 02/24/2017 Document Reviewed: 02/24/2017 Elsevier Interactive Patient Education  2019 [employer].

## 2018-02-23 NOTE — Progress Notes (Signed)
Chief Complaint  Patient presents with  . Hip Pain    left hip pops     78 year old female history of atrial fibrillation currently on Coumadin history of hypertension current smoker presents to discuss possible left total hip replacement  Complains of 6 out of 10 occasional 10 out of 10 pain with clicking and pain in the left hip and groin  Patient says she has no shortness of breath or chest pain she does have a history of some lower back pain which has resolved since I last saw her  We talked about the total hip replacement the inpatient stay but need to be off Coumadin the need to stop smoking for 1 month.  We gave her the risks of infection surgery blood clot bleeding  She agreed to stop smoking she will talk to her doctor about her nicotine patch I will see her in a month we will schedule surgery at that time we will take a preop film  Encounter Diagnosis  Name Primary?  . Hip arthritis-left Yes    15 min spent discussing tha

## 2018-02-26 ENCOUNTER — Inpatient Hospital Stay (HOSPITAL_COMMUNITY): Payer: Medicare Other | Attending: Hematology

## 2018-02-26 DIAGNOSIS — I10 Essential (primary) hypertension: Secondary | ICD-10-CM | POA: Diagnosis not present

## 2018-02-26 DIAGNOSIS — C8330 Diffuse large B-cell lymphoma, unspecified site: Secondary | ICD-10-CM | POA: Insufficient documentation

## 2018-02-26 DIAGNOSIS — R918 Other nonspecific abnormal finding of lung field: Secondary | ICD-10-CM | POA: Insufficient documentation

## 2018-02-26 DIAGNOSIS — M7918 Myalgia, other site: Secondary | ICD-10-CM | POA: Insufficient documentation

## 2018-02-26 DIAGNOSIS — I482 Chronic atrial fibrillation, unspecified: Secondary | ICD-10-CM | POA: Diagnosis not present

## 2018-02-26 DIAGNOSIS — Z79899 Other long term (current) drug therapy: Secondary | ICD-10-CM | POA: Diagnosis not present

## 2018-02-26 DIAGNOSIS — C8580 Other specified types of non-Hodgkin lymphoma, unspecified site: Secondary | ICD-10-CM

## 2018-02-26 DIAGNOSIS — Z7901 Long term (current) use of anticoagulants: Secondary | ICD-10-CM | POA: Insufficient documentation

## 2018-02-26 DIAGNOSIS — Z720 Tobacco use: Secondary | ICD-10-CM

## 2018-02-26 LAB — COMPREHENSIVE METABOLIC PANEL
ALT: 16 U/L (ref 0–44)
AST: 22 U/L (ref 15–41)
Albumin: 4 g/dL (ref 3.5–5.0)
Alkaline Phosphatase: 68 U/L (ref 38–126)
Anion gap: 9 (ref 5–15)
BUN: 14 mg/dL (ref 8–23)
CO2: 28 mmol/L (ref 22–32)
Calcium: 8.5 mg/dL — ABNORMAL LOW (ref 8.9–10.3)
Chloride: 103 mmol/L (ref 98–111)
Creatinine, Ser: 0.55 mg/dL (ref 0.44–1.00)
GFR calc non Af Amer: >60 mL/min (ref >60)
Glucose, Bld: 98 mg/dL (ref 70–99)
Potassium: 3.5 mmol/L (ref 3.5–5.1)
Sodium: 140 mmol/L (ref 135–145)
Total Bilirubin: 0.2 mg/dL — ABNORMAL LOW (ref 0.3–1.2)
Total Protein: 7.3 g/dL (ref 6.5–8.1)

## 2018-02-26 LAB — CBC WITH DIFFERENTIAL/PLATELET
Abs Immature Granulocytes: 0.03 10*3/uL (ref 0.00–0.07)
BASOS PCT: 1 %
Basophils Absolute: 0.1 10*3/uL (ref 0.0–0.1)
Eosinophils Absolute: 0.7 10*3/uL — ABNORMAL HIGH (ref 0.0–0.5)
Eosinophils Relative: 7 %
HCT: 42.6 % (ref 36.0–46.0)
Hemoglobin: 13.2 g/dL (ref 12.0–15.0)
Immature Granulocytes: 0 %
Lymphocytes Relative: 39 %
Lymphs Abs: 4.1 10*3/uL — ABNORMAL HIGH (ref 0.7–4.0)
MCH: 27.3 pg (ref 26.0–34.0)
MCHC: 31 g/dL (ref 30.0–36.0)
MCV: 88 fL (ref 80.0–100.0)
Monocytes Absolute: 0.8 10*3/uL (ref 0.1–1.0)
Monocytes Relative: 7 %
Neutro Abs: 4.7 10*3/uL (ref 1.7–7.7)
Neutrophils Relative %: 46 %
Platelets: 331 10*3/uL (ref 150–400)
RBC: 4.84 MIL/uL (ref 3.87–5.11)
RDW: 19 % — AB (ref 11.5–15.5)
WBC: 10.4 10*3/uL (ref 4.0–10.5)
nRBC: 0 % (ref 0.0–0.2)

## 2018-02-26 LAB — LACTATE DEHYDROGENASE: LDH: 163 U/L (ref 98–192)

## 2018-02-27 LAB — BETA 2 MICROGLOBULIN, SERUM: BETA 2 MICROGLOBULIN: 1.7 mg/L (ref 0.6–2.4)

## 2018-03-01 ENCOUNTER — Ambulatory Visit (INDEPENDENT_AMBULATORY_CARE_PROVIDER_SITE_OTHER): Payer: Medicare Other | Admitting: Pharmacist

## 2018-03-01 DIAGNOSIS — Z5181 Encounter for therapeutic drug level monitoring: Secondary | ICD-10-CM | POA: Diagnosis not present

## 2018-03-01 DIAGNOSIS — I482 Chronic atrial fibrillation, unspecified: Secondary | ICD-10-CM

## 2018-03-01 LAB — POCT INR: INR: 1.7 — AB (ref 2.0–3.0)

## 2018-03-01 NOTE — Patient Instructions (Signed)
Description   Take 1 tablet today then change coumadin to 1 tablet daily except 1/2 tablet on Mondays and Fridays. Recheck in 4 weeks. Call once procedure scheduled.

## 2018-03-02 LAB — PROTEIN ELECTROPHORESIS, SERUM
A/G Ratio: 1.3 (ref 0.7–1.7)
Albumin ELP: 3.7 g/dL (ref 2.9–4.4)
Alpha-1-Globulin: 0.2 g/dL (ref 0.0–0.4)
Alpha-2-Globulin: 0.9 g/dL (ref 0.4–1.0)
Beta Globulin: 1 g/dL (ref 0.7–1.3)
Gamma Globulin: 0.7 g/dL (ref 0.4–1.8)
Globulin, Total: 2.9 g/dL (ref 2.2–3.9)
Total Protein ELP: 6.6 g/dL (ref 6.0–8.5)

## 2018-03-04 ENCOUNTER — Ambulatory Visit (INDEPENDENT_AMBULATORY_CARE_PROVIDER_SITE_OTHER): Payer: Medicare Other | Admitting: Internal Medicine

## 2018-03-05 ENCOUNTER — Ambulatory Visit (HOSPITAL_COMMUNITY)
Admission: RE | Admit: 2018-03-05 | Discharge: 2018-03-05 | Disposition: A | Discharge status: Home / Self Care | Payer: Medicare Other | Source: Ambulatory Visit | Attending: Internal Medicine | Admitting: Internal Medicine

## 2018-03-05 ENCOUNTER — Encounter (HOSPITAL_COMMUNITY): Payer: Self-pay

## 2018-03-05 DIAGNOSIS — C8580 Other specified types of non-Hodgkin lymphoma, unspecified site: Secondary | ICD-10-CM | POA: Diagnosis not present

## 2018-03-05 DIAGNOSIS — R918 Other nonspecific abnormal finding of lung field: Secondary | ICD-10-CM

## 2018-03-05 DIAGNOSIS — Z720 Tobacco use: Secondary | ICD-10-CM

## 2018-03-05 DIAGNOSIS — R911 Solitary pulmonary nodule: Secondary | ICD-10-CM | POA: Diagnosis not present

## 2018-03-05 DIAGNOSIS — M7918 Myalgia, other site: Secondary | ICD-10-CM | POA: Diagnosis not present

## 2018-03-05 DIAGNOSIS — R221 Localized swelling, mass and lump, neck: Secondary | ICD-10-CM | POA: Diagnosis not present

## 2018-03-05 MED ORDER — IOHEXOL 300 MG/ML  SOLN
75.0000 mL | Freq: Once | INTRAMUSCULAR | Status: AC | PRN
  Administered 2018-03-05: 75 mL via INTRAVENOUS

## 2018-03-09 ENCOUNTER — Inpatient Hospital Stay (HOSPITAL_COMMUNITY): Payer: Medicare Other | Attending: Hematology | Admitting: Internal Medicine

## 2018-03-09 ENCOUNTER — Other Ambulatory Visit: Payer: Self-pay

## 2018-03-09 DIAGNOSIS — F17210 Nicotine dependence, cigarettes, uncomplicated: Secondary | ICD-10-CM | POA: Diagnosis not present

## 2018-03-09 DIAGNOSIS — I10 Essential (primary) hypertension: Secondary | ICD-10-CM | POA: Insufficient documentation

## 2018-03-09 DIAGNOSIS — J439 Emphysema, unspecified: Secondary | ICD-10-CM | POA: Diagnosis not present

## 2018-03-09 DIAGNOSIS — I482 Chronic atrial fibrillation, unspecified: Secondary | ICD-10-CM | POA: Diagnosis not present

## 2018-03-09 DIAGNOSIS — C8330 Diffuse large B-cell lymphoma, unspecified site: Secondary | ICD-10-CM | POA: Diagnosis not present

## 2018-03-09 DIAGNOSIS — Z923 Personal history of irradiation: Secondary | ICD-10-CM | POA: Insufficient documentation

## 2018-03-09 DIAGNOSIS — R911 Solitary pulmonary nodule: Secondary | ICD-10-CM

## 2018-03-09 DIAGNOSIS — Z79899 Other long term (current) drug therapy: Secondary | ICD-10-CM | POA: Diagnosis not present

## 2018-03-09 DIAGNOSIS — Z9221 Personal history of antineoplastic chemotherapy: Secondary | ICD-10-CM | POA: Diagnosis not present

## 2018-03-09 DIAGNOSIS — R410 Disorientation, unspecified: Secondary | ICD-10-CM | POA: Diagnosis not present

## 2018-03-09 DIAGNOSIS — Z7901 Long term (current) use of anticoagulants: Secondary | ICD-10-CM | POA: Insufficient documentation
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
## 2018-03-16 ENCOUNTER — Ambulatory Visit (HOSPITAL_COMMUNITY)
Admission: RE | Admit: 2018-03-16 | Discharge: 2018-03-16 | Disposition: A | Discharge status: Home / Self Care | Payer: Medicare Other | Source: Ambulatory Visit | Attending: Internal Medicine | Admitting: Internal Medicine

## 2018-03-16 DIAGNOSIS — R51 Headache: Secondary | ICD-10-CM | POA: Diagnosis not present

## 2018-03-16 DIAGNOSIS — R911 Solitary pulmonary nodule: Secondary | ICD-10-CM

## 2018-03-16 MED ORDER — GADOBUTROL 1 MMOL/ML IV SOLN
5.0000 mL | Freq: Once | INTRAVENOUS | Status: AC | PRN
  Administered 2018-03-16: 5 mL via INTRAVENOUS

## 2018-03-18 ENCOUNTER — Ambulatory Visit (HOSPITAL_COMMUNITY)
Admission: RE | Admit: 2018-03-18 | Discharge: 2018-03-18 | Disposition: A | Discharge status: Home / Self Care | Payer: Medicare Other | Source: Ambulatory Visit | Attending: Internal Medicine | Admitting: Internal Medicine

## 2018-03-18 ENCOUNTER — Encounter (HOSPITAL_COMMUNITY): Payer: Self-pay

## 2018-03-18 DIAGNOSIS — R918 Other nonspecific abnormal finding of lung field: Secondary | ICD-10-CM | POA: Insufficient documentation

## 2018-03-18 DIAGNOSIS — Z8572 Personal history of non-Hodgkin lymphomas: Secondary | ICD-10-CM | POA: Diagnosis not present

## 2018-03-18 DIAGNOSIS — N83202 Unspecified ovarian cyst, left side: Secondary | ICD-10-CM | POA: Diagnosis not present

## 2018-03-18 DIAGNOSIS — J0100 Acute maxillary sinusitis, unspecified: Secondary | ICD-10-CM | POA: Diagnosis not present

## 2018-03-18 DIAGNOSIS — M1612 Unilateral primary osteoarthritis, left hip: Secondary | ICD-10-CM | POA: Diagnosis not present

## 2018-03-18 DIAGNOSIS — I700 Atherosclerosis of aorta: Secondary | ICD-10-CM | POA: Diagnosis not present

## 2018-03-18 DIAGNOSIS — J439 Emphysema, unspecified: Secondary | ICD-10-CM | POA: Insufficient documentation

## 2018-03-18 DIAGNOSIS — R911 Solitary pulmonary nodule: Secondary | ICD-10-CM | POA: Diagnosis not present

## 2018-03-18 DIAGNOSIS — J328 Other chronic sinusitis: Secondary | ICD-10-CM | POA: Diagnosis not present

## 2018-03-18 DIAGNOSIS — N281 Cyst of kidney, acquired: Secondary | ICD-10-CM | POA: Diagnosis not present

## 2018-03-18 LAB — GLUCOSE, CAPILLARY: Glucose-Capillary: 85 mg/dL (ref 70–99)

## 2018-03-18 MED ORDER — FLUDEOXYGLUCOSE F - 18 (FDG) INJECTION
6.0200 | Freq: Once | INTRAVENOUS | Status: AC | PRN
  Administered 2018-03-18: 6.02 via INTRAVENOUS

## 2018-03-22 ENCOUNTER — Encounter (HOSPITAL_COMMUNITY): Payer: Self-pay | Admitting: Internal Medicine

## 2018-03-22 ENCOUNTER — Ambulatory Visit (INDEPENDENT_AMBULATORY_CARE_PROVIDER_SITE_OTHER): Payer: Medicare Other | Admitting: Internal Medicine

## 2018-03-22 ENCOUNTER — Telehealth (INDEPENDENT_AMBULATORY_CARE_PROVIDER_SITE_OTHER): Payer: Self-pay | Admitting: *Deleted

## 2018-03-22 ENCOUNTER — Encounter (INDEPENDENT_AMBULATORY_CARE_PROVIDER_SITE_OTHER): Payer: Self-pay | Admitting: *Deleted

## 2018-03-22 ENCOUNTER — Other Ambulatory Visit: Payer: Self-pay

## 2018-03-22 ENCOUNTER — Encounter (INDEPENDENT_AMBULATORY_CARE_PROVIDER_SITE_OTHER): Payer: Self-pay | Admitting: Internal Medicine

## 2018-03-22 ENCOUNTER — Inpatient Hospital Stay (HOSPITAL_BASED_OUTPATIENT_CLINIC_OR_DEPARTMENT_OTHER): Payer: Medicare Other | Admitting: Internal Medicine

## 2018-03-22 VITALS — BP 112/73 | HR 81 | Temp 97.7°F | Resp 18 | Wt 115.4 lb

## 2018-03-22 VITALS — BP 129/83 | HR 105 | Temp 98.4°F | Ht 60.0 in | Wt 115.3 lb

## 2018-03-22 DIAGNOSIS — F17210 Nicotine dependence, cigarettes, uncomplicated: Secondary | ICD-10-CM

## 2018-03-22 DIAGNOSIS — Z7901 Long term (current) use of anticoagulants: Secondary | ICD-10-CM

## 2018-03-22 DIAGNOSIS — C8330 Diffuse large B-cell lymphoma, unspecified site: Secondary | ICD-10-CM

## 2018-03-22 DIAGNOSIS — R195 Other fecal abnormalities: Secondary | ICD-10-CM

## 2018-03-22 DIAGNOSIS — Z79899 Other long term (current) drug therapy: Secondary | ICD-10-CM | POA: Diagnosis not present

## 2018-03-22 DIAGNOSIS — C8580 Other specified types of non-Hodgkin lymphoma, unspecified site: Secondary | ICD-10-CM

## 2018-03-22 DIAGNOSIS — I10 Essential (primary) hypertension: Secondary | ICD-10-CM | POA: Diagnosis not present

## 2018-03-22 DIAGNOSIS — R410 Disorientation, unspecified: Secondary | ICD-10-CM

## 2018-03-22 DIAGNOSIS — J439 Emphysema, unspecified: Secondary | ICD-10-CM

## 2018-03-22 DIAGNOSIS — Z923 Personal history of irradiation: Secondary | ICD-10-CM

## 2018-03-22 DIAGNOSIS — Z9221 Personal history of antineoplastic chemotherapy: Secondary | ICD-10-CM | POA: Diagnosis not present

## 2018-03-22 MED ORDER — PEG 3350-KCL-NA BICARB-NACL 420 G PO SOLR: 4000.0000 mL | Freq: Once | ORAL | 0 refills | Status: AC

## 2018-03-22 NOTE — Patient Instructions (Signed)
The risks of bleeding, perforation and infection were reviewed with patient.  

## 2018-03-22 NOTE — Telephone Encounter (Signed)
Patient needs trilyte 

## 2018-03-22 NOTE — Progress Notes (Signed)
Subjective:    Patient ID: Marissa Turner, female    DOB: 12-20-1940, 78 y.o.   MRN: [id number]  HPI Referred by Dr. Nevada Crane for positive cologuard.Has never undergone a colonoscopy. She has a BM daily. No melena or BRRB. Appetite is good. No weight loss. No family hx of colon cancer.  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   Maintained on Coumadin for atrial fib, IDA, Lymphoma hx of. Review of Systems Past Medical History:  Diagnosis Date  . Atrial fibrillation, chronic 2004   left atrial thrombus in 2004; near syncope in 2008; echocardiogram in 2008-normal EF, mild left atrial enlargement, no valvular abnormalities, lipomatous hypertrophy; no aortic atheroma on TEE in 2004  . Cancer (HCC)    LYMPH NODE  . Chronic anticoagulation 2004   warfarin  . Depression   . DJD (degenerative joint disease), lumbosacral    Also hip  . Dysrhythmia    chronic AFib  . Fracture of wrist    Left  . Hypertension    Normal carotid ultrasound in 2008  . Peptic ulcer disease    s/p Billroth II  . PONV (postoperative nausea and vomiting)   . Tobacco abuse    40 pack years; quit attempt in 2012    Past Surgical History:  Procedure Laterality Date  . ABDOMINAL HYSTERECTOMY     w/o oophorectomy  . APPENDECTOMY    . CESAREAN SECTION    . CHOLECYSTECTOMY  2004   [place]   patient denies ever having undergone colonoscopy as of 2014  . GASTROJEJUNOSTOMY  1984   Billroth II; splenectomy; incidental appendectomy  . KNEE ARTHROSCOPY WITH MEDIAL MENISECTOMY Right 02/26/2015   Procedure: KNEE ARTHROSCOPY WITH MEDIAL MENISECTOMY;  Surgeon: Sanjuana Kava, MD;  Location: [facility];  Service: Orthopedics;  Laterality: Right;  . LYMPH NODE BIOPSY Left 09/15/2013   Procedure: LEFT NECK CERVICAL NODE BIOPSY;  Surgeon: Scherry Ran, MD;  Location: [facility];  Service: General;  Laterality: Left;  . PORT-A-CATH REMOVAL Right 07/12/2014   Procedure: REMOVAL  PORT-A-CATH;  Surgeon: Aviva Signs Md, MD;  Location: [facility];  Service: General;  Laterality: Right;  . PORTACATH PLACEMENT Right 10/21/2013   Procedure: ATTEMPTED INSERTION PORT-A-CATH;  Surgeon: Scherry Ran, MD;  Location: [facility];  Service: General;  Laterality: Right;  . SPLENECTOMY, TOTAL    . TONSILLECTOMY      Allergies  Allergen Reactions  . Diphenhydramine Hcl Other (See Comments)    hallucinations   . Estrogens Other (See Comments)    edema  . Penicillins Other (See Comments)    Break out in whelps.  . Codeine Swelling and Rash    Current Outpatient Medications on File Prior to Visit  Medication Sig Dispense Refill  . acetaminophen (TYLENOL) 500 MG tablet Take 1,000 mg by mouth daily as needed for headache.    . calcium-vitamin D (OSCAL WITH D) 500-200 MG-UNIT tablet Take 1 tablet by mouth daily with breakfast. 60 tablet 2  . citalopram (CELEXA) 20 MG tablet TAKE ONE (1) TABLET BY MOUTH EVERY DAY 30 tablet 5  . diltiazem (CARDIZEM CD) 240 MG 24 hr capsule Take 1 capsule (240 mg total) by mouth daily. 90 capsule 3  . ferrous sulfate 325 (65 FE) MG tablet Take 325 mg by mouth daily with breakfast.    . fexofenadine (KP FEXOFENADINE HCL) 180 MG tablet Take 180 mg by mouth daily.    [name]  warfarin (COUMADIN) 5 MG tablet TAKE ONE TABLELT ONCE DAILY EXCEPT TAKE 1/2 TABLET ON MONDAY, WEDNESDAY ANDFRIDAY 30 tablet 4  . ALPRAZolam (XANAX) 1 MG tablet Take 1 tablet (1 mg total) by mouth 3 (three) times daily as needed for anxiety. (Patient taking differently: Take 1 mg by mouth daily. ) 30 tablet 0   No current facility-administered medications on file prior to visit.         Objective:   Physical Exam Blood pressure 129/83, pulse (!) 105, temperature 98.4 F (36.9 C), height 5' (1.524 m), weight 115 lb 4.8 oz (52.3 kg). Alert and oriented. Skin warm and dry. Oral mucosa is moist.   . Sclera anicteric, conjunctivae is pink. Thyroid not enlarged. No cervical lymphadenopathy.  Lungs clear. Heart regular rate and rhythm.  Abdomen is soft. Bowel sounds are positive. No hepatomegaly. No abdominal masses felt. No tenderness.  No edema to lower extremities.          Assessment & Plan:  +cologuard. The risks of bleeding, perforation and infection were reviewed with patient.
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
## 2018-03-23 ENCOUNTER — Other Ambulatory Visit: Payer: Self-pay | Admitting: *Deleted

## 2018-03-23 ENCOUNTER — Institutional Professional Consult (permissible substitution) (INDEPENDENT_AMBULATORY_CARE_PROVIDER_SITE_OTHER): Payer: Medicare Other | Admitting: Thoracic Surgery (Cardiothoracic Vascular Surgery)

## 2018-03-23 ENCOUNTER — Encounter: Payer: Self-pay | Admitting: Thoracic Surgery (Cardiothoracic Vascular Surgery)

## 2018-03-23 VITALS — BP 116/80 | HR 60 | Resp 16 | Ht 60.0 in | Wt 115.0 lb

## 2018-03-23 DIAGNOSIS — Z7901 Long term (current) use of anticoagulants: Secondary | ICD-10-CM | POA: Diagnosis not present

## 2018-03-23 DIAGNOSIS — I482 Chronic atrial fibrillation, unspecified: Secondary | ICD-10-CM

## 2018-03-23 DIAGNOSIS — C8580 Other specified types of non-Hodgkin lymphoma, unspecified site: Secondary | ICD-10-CM

## 2018-03-23 DIAGNOSIS — R918 Other nonspecific abnormal finding of lung field: Secondary | ICD-10-CM

## 2018-03-23 NOTE — Progress Notes (Signed)
PCP is Celene Squibb, MD Referring Provider is Higgs, Mathis Dad, MD  Chief Complaint  Patient presents with  . Lung Mass    LLLobe per CT CHEST 03/05/18 and PET 03/18/18    HPI: Mrs. Gorley sent for consultation regarding a left lower lobe lung mass  Aminah Sabala is a 78 year old woman with a past medical history significant for tobacco abuse, chronic atrial fibrillation, hypertension, large cell lymphoma treated in 2015, Billroth II for ulcer, and degenerative joint disease.  She is a poor historian so is not exactly clear what led to her chest CT. she was being evaluated for left hip pain.  Her pain is severe and she has difficulty walking due to that.  Her exercise is very limited although she does take care of housework.  She was sent for cardiology clearance prior to hip replacement.  A nuclear stress test was done in December.  That was low risk and she was cleared for hip surgery.  She had a CT done in early February.  She is not sure exactly what led to that CT being ordered.  It may have been scheduled for follow-up of her lymphoma, since her last PET have been done back in 2016.  In any event the CT showed a stable lingular nodule and a new 3.3 cm left lower lobe mass.  A PET/CT showed the left lower lobe mass was hypermetabolic.  There was a stable right paratracheal lymph node that was 13 mm with an SUV of 2.2 which was unchanged from her scan in 2016.  Her primary complaint is left hip pain.  She says she is not having any shortness of breath.  She is not able to walk much.  She says she does do all her housecleaning.  She denies any cough, wheezing, shortness of breath, chest pain, pressure, or tightness.  She denies change in appetite and weight loss.  She has been having headaches.  An MRI of the brain was negative.  Zubrod Score: At the time of surgery this patient's most appropriate activity status/level should be described as: []     0    Normal activity, no symptoms [x]     1     Restricted in physical strenuous activity but ambulatory, able to do out light work []     2    Ambulatory and capable of self care, unable to do work activities, up and about >50 % of waking hours                              []     3    Only limited self care, in bed greater than 50% of waking hours []     4    Completely disabled, no self care, confined to bed or chair []     5    Moribund  Past Medical History:  Diagnosis Date  . Atrial fibrillation, chronic 2004   left atrial thrombus in 2004; near syncope in 2008; echocardiogram in 2008-normal EF, mild left atrial enlargement, no valvular abnormalities, lipomatous hypertrophy; no aortic atheroma on TEE in 2004  . Cancer (HCC)    LYMPH NODE  . Chronic anticoagulation 2004   warfarin  . Depression   . DJD (degenerative joint disease), lumbosacral    Also hip  . Dysrhythmia    chronic AFib  . Fracture of wrist    Left  . Hypertension    Normal carotid ultrasound in 2008  .  Peptic ulcer disease    s/p Billroth II  . PONV (postoperative nausea and vomiting)   . Tobacco abuse    40 pack years; quit attempt in 2012    Past Surgical History:  Procedure Laterality Date  . ABDOMINAL HYSTERECTOMY     w/o oophorectomy  . APPENDECTOMY    . CESAREAN SECTION    . CHOLECYSTECTOMY  2004   [place]   patient denies ever having undergone colonoscopy as of 2014  . GASTROJEJUNOSTOMY  1984   Billroth II; splenectomy; incidental appendectomy  . KNEE ARTHROSCOPY WITH MEDIAL MENISECTOMY Right 02/26/2015   Procedure: KNEE ARTHROSCOPY WITH MEDIAL MENISECTOMY;  Surgeon: Sanjuana Kava, MD;  Location: [facility];  Service: Orthopedics;  Laterality: Right;  . LYMPH NODE BIOPSY Left 09/15/2013   Procedure: LEFT NECK CERVICAL NODE BIOPSY;  Surgeon: Scherry Ran, MD;  Location: [facility];  Service: General;  Laterality: Left;  . PORT-A-CATH REMOVAL Right 07/12/2014   Procedure: REMOVAL PORT-A-CATH;  Surgeon: Aviva Signs Md,  MD;  Location: [facility];  Service: General;  Laterality: Right;  . PORTACATH PLACEMENT Right 10/21/2013   Procedure: ATTEMPTED INSERTION PORT-A-CATH;  Surgeon: Scherry Ran, MD;  Location: [facility];  Service: General;  Laterality: Right;  . SPLENECTOMY, TOTAL    . TONSILLECTOMY      Family History  Problem Relation Age of Onset  . Heart attack Mother        also brother  . Breast cancer Sister   . Heart attack Brother   . Breast cancer Sister        x2  . Cancer Brother        Gastric carcinoma; also father  . Cirrhosis Sister     Social History Social History   Tobacco Use  . Smoking status: Current Every Day Smoker    Packs/day: 0.50    Years: 55.00    Pack years: 27.50    Types: Cigarettes  . Smokeless tobacco: Never Used  . Tobacco comment: 2-3 cigarettes a day  Substance Use Topics  . Alcohol use: No  . Drug use: No    Current Outpatient Medications  Medication Sig Dispense Refill  . acetaminophen (TYLENOL) 500 MG tablet Take 1,000 mg by mouth daily as needed for headache.    . ALPRAZolam (XANAX) 1 MG tablet Take 1 tablet (1 mg total) by mouth 3 (three) times daily as needed for anxiety. (Patient taking differently: Take 1 mg by mouth daily. ) 30 tablet 0  . calcium-vitamin D (OSCAL WITH D) 500-200 MG-UNIT tablet Take 1 tablet by mouth daily with breakfast. 60 tablet 2  . citalopram (CELEXA) 20 MG tablet TAKE ONE (1) TABLET BY MOUTH EVERY DAY 30 tablet 5  . diltiazem (CARDIZEM CD) 240 MG 24 hr capsule Take 1 capsule (240 mg total) by mouth daily. 90 capsule 3  . ferrous sulfate 325 (65 FE) MG tablet Take 325 mg by mouth daily with breakfast.    . fexofenadine (KP FEXOFENADINE HCL) 180 MG tablet Take 180 mg by mouth daily.    [name] warfarin (COUMADIN) 5 MG tablet TAKE ONE TABLELT ONCE DAILY EXCEPT TAKE 1/2 TABLET ON MONDAY, WEDNESDAY ANDFRIDAY 30 tablet 4   No current facility-administered medications for this visit.     Allergies  Allergen Reactions  .  Diphenhydramine Hcl Other (See Comments)    hallucinations   . Estrogens Other (See Comments)    edema  . Penicillins Other (See Comments)  Break out in whelps.  . Codeine Swelling and Rash    Review of Systems  Constitutional: Negative for activity change (limited), appetite change and unexpected weight change.  HENT: Negative for trouble swallowing and voice change.   Eyes: Negative for visual disturbance.  Respiratory: Negative for cough, shortness of breath and wheezing.   Cardiovascular: Negative for chest pain, palpitations and leg swelling.  Gastrointestinal: Negative for abdominal distention and abdominal pain.  Genitourinary: Negative for difficulty urinating and dysuria.  Musculoskeletal: Positive for arthralgias (left hip) and gait problem.  Neurological: Positive for headaches. Negative for seizures, syncope and weakness.  Hematological: Negative for adenopathy. Does not bruise/bleed easily.  Psychiatric/Behavioral: Positive for confusion. The patient is nervous/anxious.     BP 116/80 (BP Location: Left Arm, Patient Position: Sitting, Cuff Size: Small) Comment (Cuff Size): MANUALLY  Pulse 60   Resp 16   Ht 5' (1.524 m)   Wt 115 lb (52.2 kg)   SpO2 95% Comment: ON RA  BMI 22.46 kg/m  Physical Exam Vitals signs reviewed.  Constitutional:      General: She is not in acute distress.    Appearance: Normal appearance.  HENT:     Head: Normocephalic and atraumatic.     Mouth/Throat:     Pharynx: Oropharynx is clear.  Eyes:     General: No scleral icterus.    Extraocular Movements: Extraocular movements intact.     Conjunctiva/sclera: Conjunctivae normal.  Neck:     Musculoskeletal: No neck rigidity.  Cardiovascular:     Rate and Rhythm: Normal rate and regular rhythm.     Heart sounds: No murmur. No friction rub. No gallop.   Pulmonary:     Effort: Pulmonary effort is normal. No respiratory distress.     Breath sounds: Normal breath sounds. No wheezing or  rales.  Abdominal:     General: There is no distension.     Palpations: Abdomen is soft.     Tenderness: There is no abdominal tenderness.  Musculoskeletal:        General: No swelling.  Lymphadenopathy:     Cervical: No cervical adenopathy.  Skin:    General: Skin is warm and dry.  Neurological:     General: No focal deficit present.     Mental Status: She is alert and oriented to person, place, and time.     Cranial Nerves: No cranial nerve deficit.     Motor: No weakness.     Gait: Gait abnormal.   Diagnostic Tests: CT CHEST WITH CONTRAST  TECHNIQUE: Multidetector CT imaging of the chest was performed during intravenous contrast administration.  CONTRAST:  54mL OMNIPAQUE IOHEXOL 300 MG/ML  SOLN  COMPARISON:  PET-CT 02/06/2014  FINDINGS: Cardiovascular: Cardiac enlargement. No pericardial effusion. Aortic atherosclerosis. Calcifications in the LAD and left circumflex coronary artery identified.  Mediastinum/Nodes: Normal appearance of the thyroid gland. The trachea appears patent and is midline. Normal appearance of the esophagus. No supraclavicular or axillary adenopathy. Low right paratracheal lymph node measures 1.4 cm, image 53/3. Increased from 1 cm previously. No hilar adenopathy.  Lungs/Pleura: Severe changes of centrilobular emphysema with diffuse bronchial wall thickening. Lobular mass within the left lower lobe measures 3.1 by 3.3 by 2.3 cm. This is new when compared with 02/06/14. Similar nodule within the lingula measuring 1.1 cm, image 77/5. 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 Biapical pleuroparenchymal scarring identified.  Upper Abdomen: No acute findings within the abdomen. Multiple splenules are identified within the  left upper quadrant of the abdomen. Left kidney cysts identified.  Musculoskeletal: No chest wall abnormality. No acute or significant osseous  findings.  IMPRESSION: 1. New mass within the left lower lobe has a maximum diameter of 3.3 cm and is highly suspicious for primary bronchogenic carcinoma. Further evaluation with PET-CT and tissue sampling is recommended. 2. Increase in size of low right paratracheal lymph node which now measures 1.4 cm. Attention in this lymph node on PET-CT is advised. 3. 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 4. Diffuse bronchial wall thickening with emphysema, as above; imaging findings suggestive of underlying COPD. 5. Aortic atherosclerosis with coronary artery atherosclerotic calcifications.  Aortic Atherosclerosis (ICD10-I70.0) and Emphysema (ICD10-J43.9).   Electronically Signed   By: Kerby Moors M.D.   On: 03/05/2018 15:44 NUCLEAR MEDICINE PET SKULL BASE TO THIGH  TECHNIQUE: 6.0 mCi F-18 FDG was injected intravenously. Full-ring PET imaging was performed from the skull base to thigh after the radiotracer. CT data was obtained and used for attenuation correction and anatomic localization.  Fasting blood glucose: 85 mg/dl  COMPARISON:  Multiple exams, including 02/06/2014 and CT chest from 03/05/2018  FINDINGS: Mediastinal blood pool activity: SUV max 1.9  NECK: No significant abnormal hypermetabolic activity in this region.  Incidental CT findings: Acute on chronic bilateral maxillary sinusitis with chronic ethmoid sinusitis. Bilateral common carotid atherosclerotic calcifications.  CHEST: 3.3 by 2.8 cm left lower lobe mass has a maximum SUV 10.2, compatible with malignancy.  A rounded nodule in the left upper lobe measuring 1.0 cm in diameter on image 76/4 has a maximum SUV of 0.8. This nodule is unchanged from 02/06/2014.  A lower paratracheal node anterior to the carina measures 1.3 cm in diameter with maximum SUV 2.2. Back on 02/06/2014 this lesion measured 1.3 cm in diameter with  a maximum SUV of 2.2.  Incidental CT findings: Coronary, aortic arch, and branch vessel atherosclerotic vascular disease. Cardiomegaly noted. Biapical pleuroparenchymal scarring. Centrilobular emphysema.  ABDOMEN/PELVIS: Small amount of high activity along the vaginal vestibule/perineum, probably from mild urinary incontinence. Adrenal glands normal.  Incidental CT findings: Hepatic steatosis. Postoperative findings in the left upper quadrant and involving the stomach. Multiple small accessory spleens. Photopenic renal cysts. Aortoiliac atherosclerotic vascular disease. Photopenic 2.0 by 1.5 cm cystic lesion of the left ovary, not appreciably changed from the prior PET-CT of 2016.  SKELETON: No significant abnormal hypermetabolic activity in this region.  Incidental CT findings: Severe arthropathy left hip.  IMPRESSION: 1. The 3.3 cm left lower lobe mass has a maximum SUV of 10.2, favoring bronchogenic carcinoma. No definite hypermetabolic adenopathy. A lower paratracheal node anterior to the carina is mildly prominent and with an activity similar to blood pool, but had a similar size and activity back on 02/06/2014. 2. A chronic left upper lobe pulmonary nodule is not hypermetabolic and has been reported back through 2004, and is considered benign. 3. Other imaging findings of potential clinical significance: Acute on chronic bilateral maxillary sinusitis with chronic ethmoid sinusitis. Aortic Atherosclerosis (ICD10-I70.0) and Emphysema (ICD10-J43.9). Coronary atherosclerosis. Cardiomegaly. Hepatic steatosis. Postoperative findings in the left upper quadrant. Photopenic renal cysts. Photopenic left ovarian cyst. Severe degenerative arthropathy left hip.   Electronically Signed   By: Van Clines M.D.   On: 03/18/2018 15:35 I personally reviewed the CT and PET/CT images and concur with the findings noted above  Impression: Latresa Gasser is a 78 year old woman  longstanding history of tobacco abuse, chronic atrial fibrillation, hypertension,  large cell lymphoma treated in 2015, Billroth II for ulcer, and degenerative joint disease.  She recently had a CT of the chest which showed a stable nodule in the lingula that is consistent with a hamartoma.  However, there was a new 3.3 cm mass in the left lower lobe that was highly suspicious.  A PET/CT showed this mass was hypermetabolic with an SUV of 54.6.  Findings are consistent with a primary bronchogenic carcinoma.  Although infectious or inflammatory nodules are in the differential the possibility of those is extraordinarily small compared to the probability of it being cancer.  I reviewed the films with Mr. and Mrs. Christine and discussed the differential diagnosis.  I discussed the need to establish a definitive diagnosis and treatment for this lung mass.  Given the high probability of this being cancer my recommendation would be to proceed with surgical resection if she is a candidate.  She has not had pulmonary function testing.  She does have some significant emphysematous changes on CT.  She has pulmonary function testing with and without bronchodilators before any decision can be made regarding whether or not she is a surgical candidate.  Her activities are limited by her hip pain to a degree that precludes making any accurate evaluation from that standpoint.  We did discuss the alternative of radiation for that mass.  That certainly would be the second choice if she is not a surgical candidate but does not have equivalent survival.  She does have chronic atrial fibrillation and would need to be off Coumadin prior to any biopsy or surgery.  We would work that out with Dr. Bronson Ing.  Her nuclear stress test was low risk and she was cleared for hip surgery previously.  Tobacco abuse-longstanding.  She says she is currently only smoking 2 to 3 cigarettes a day.  She does not think she is capable of  quitting.   Plan: Pulmonary function testing with and without bronchodilators Return in 2 weeks to discuss results  Melrose Nakayama, MD [facility] [phone]

## 2018-03-26 DIAGNOSIS — F410 Panic disorder [episodic paroxysmal anxiety] without agoraphobia: Secondary | ICD-10-CM | POA: Diagnosis not present

## 2018-03-26 DIAGNOSIS — Z720 Tobacco use: Secondary | ICD-10-CM | POA: Diagnosis not present

## 2018-03-26 DIAGNOSIS — I10 Essential (primary) hypertension: Secondary | ICD-10-CM | POA: Diagnosis not present

## 2018-03-26 DIAGNOSIS — E782 Mixed hyperlipidemia: Secondary | ICD-10-CM | POA: Diagnosis not present

## 2018-03-26 DIAGNOSIS — D509 Iron deficiency anemia, unspecified: Secondary | ICD-10-CM | POA: Diagnosis not present

## 2018-03-29 ENCOUNTER — Encounter: Payer: Self-pay | Admitting: Orthopedic Surgery

## 2018-03-29 ENCOUNTER — Ambulatory Visit: Payer: Medicare Other | Admitting: Orthopedic Surgery

## 2018-03-29 ENCOUNTER — Ambulatory Visit (INDEPENDENT_AMBULATORY_CARE_PROVIDER_SITE_OTHER): Payer: Medicare Other | Admitting: *Deleted

## 2018-03-29 DIAGNOSIS — I482 Chronic atrial fibrillation, unspecified: Secondary | ICD-10-CM

## 2018-03-29 DIAGNOSIS — Z5181 Encounter for therapeutic drug level monitoring: Secondary | ICD-10-CM | POA: Diagnosis not present

## 2018-03-29 LAB — POCT INR: INR: 2.3 (ref 2.0–3.0)

## 2018-03-29 NOTE — Patient Instructions (Signed)
Continue coumadin 1 tablet daily except 1/2 tablet on Mondays and Fridays. Recheck in 4 weeks. Call once procedure scheduled.

## 2018-04-05 ENCOUNTER — Ambulatory Visit (HOSPITAL_COMMUNITY)
Admission: RE | Admit: 2018-04-05 | Discharge: 2018-04-05 | Disposition: A | Discharge status: Home / Self Care | Payer: Medicare Other | Source: Ambulatory Visit | Attending: Thoracic Surgery (Cardiothoracic Vascular Surgery) | Admitting: Thoracic Surgery (Cardiothoracic Vascular Surgery)

## 2018-04-05 ENCOUNTER — Ambulatory Visit: Payer: Medicare Other | Admitting: Thoracic Surgery (Cardiothoracic Vascular Surgery)

## 2018-04-05 DIAGNOSIS — R918 Other nonspecific abnormal finding of lung field: Secondary | ICD-10-CM | POA: Insufficient documentation

## 2018-04-05 LAB — PULMONARY FUNCTION TEST
DL/VA % PRED: 73 %
DL/VA: 3.09 ml/min/mmHg/L
DLCO unc % pred: 62 %
DLCO unc: 10.35 ml/min/mmHg
FEF 25-75 Post: 0.4 L/sec
FEF 25-75 Pre: 0.46 L/sec
FEF2575-%Change-Post: -12 %
FEF2575-%Pred-Post: 30 %
FEF2575-%Pred-Pre: 34 %
FEV1-%Change-Post: -7 %
FEV1-%Pred-Post: 70 %
FEV1-%Pred-Pre: 75 %
FEV1-Post: 1.18 L
FEV1-Pre: 1.27 L
FEV1FVC-%Change-Post: 3 %
FEV1FVC-%Pred-Pre: 84 %
FEV6-%Change-Post: -4 %
FEV6-%Pred-Post: 84 %
FEV6-%Pred-Pre: 88 %
FEV6-Post: 1.8 L
FEV6-Pre: 1.88 L
FEV6FVC-%Change-Post: 6 %
FEV6FVC-%PRED-POST: 105 %
FEV6FVC-%Pred-Pre: 99 %
FVC-%Change-Post: -9 %
FVC-%Pred-Post: 79 %
FVC-%Pred-Pre: 88 %
FVC-Post: 1.81 L
FVC-Pre: 2 L
Post FEV1/FVC ratio: 65 %
Post FEV6/FVC ratio: 100 %
Pre FEV1/FVC ratio: 63 %
Pre FEV6/FVC Ratio: 94 %

## 2018-04-05 MED ORDER — ALBUTEROL SULFATE (2.5 MG/3ML) 0.083% IN NEBU
2.5000 mg | INHALATION_SOLUTION | Freq: Once | RESPIRATORY_TRACT | Status: AC
  Administered 2018-04-05: 2.5 mg via RESPIRATORY_TRACT

## 2018-04-06 ENCOUNTER — Ambulatory Visit (INDEPENDENT_AMBULATORY_CARE_PROVIDER_SITE_OTHER): Payer: Medicare Other | Admitting: Thoracic Surgery (Cardiothoracic Vascular Surgery)

## 2018-04-06 ENCOUNTER — Encounter: Payer: Self-pay | Admitting: *Deleted

## 2018-04-06 ENCOUNTER — Other Ambulatory Visit: Payer: Self-pay | Admitting: *Deleted

## 2018-04-06 VITALS — BP 124/85 | HR 76 | Resp 20 | Ht 60.0 in | Wt 115.0 lb

## 2018-04-06 DIAGNOSIS — R918 Other nonspecific abnormal finding of lung field: Secondary | ICD-10-CM

## 2018-04-06 DIAGNOSIS — R911 Solitary pulmonary nodule: Secondary | ICD-10-CM

## 2018-04-06 NOTE — Progress Notes (Signed)
[address]       [place] [address]             [phone]     HPI: Marissa Turner returns to discuss the results of her pulmonary function testing   Marissa Turner is a 78 year old woman with a history of tobacco abuse, large cell lymphoma treated in 2015, hypertension, chronic atrial fibrillation, Billroth II for ulcer, and degenerative joint disease.  She was being evaluated for a left hip replacement due to severe hip pain.  She ultimately ended up having a chest CT.  She is not exactly sure why but probably due to an abnormal chest x-ray.  She has a 3.3cm lobulated mass in the left lower lobe that was new from her previous study.  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  PET/CT showed the left lower lobe mass was hypermetabolic with an SUV of 10.  There was a 13 mm right paratracheal node that was mildly active with an SUV of 2.2 which was unchanged from previous PET back in 2016.  The left upper lobe nodule was not active.  Marissa Turner brain was negative for metastatic disease.  She really has not been able to exercise much due to her severe hip pain.  That is her primary complaint and remains so.  She is not having any chest pain, pressure, tightness, or shortness of breath.  She had a nuclear stress test done back in December.  It was a low risk study with normal left ventricular function and she was cleared for hip surgery.  Past Medical History:  Diagnosis Date  . Atrial fibrillation, chronic 2004   left atrial thrombus in 2004; near syncope in 2008; echocardiogram in 2008-normal EF, mild left atrial enlargement, no valvular abnormalities, lipomatous hypertrophy; no aortic atheroma on TEE in 2004  . Cancer (HCC)    LYMPH NODE  . Chronic anticoagulation 2004   warfarin  . Depression   . DJD (degenerative joint disease), lumbosacral    Also hip  . Dysrhythmia    chronic AFib  . Fracture of wrist    Left  . Hypertension    Normal  carotid ultrasound in 2008  . Peptic ulcer disease    s/p Billroth II  . PONV (postoperative nausea and vomiting)   . Tobacco abuse    40 pack years; quit attempt in 2012    Current Outpatient Medications  Medication Sig Dispense Refill  . acetaminophen (TYLENOL) 500 MG tablet Take 1,000 mg by mouth daily as needed for headache.    . ALPRAZolam (XANAX) 1 MG tablet Take 1 tablet (1 mg total) by mouth 3 (three) times daily as needed for anxiety. (Patient taking differently: Take 1 mg by mouth daily. ) 30 tablet 0  . calcium-vitamin D (OSCAL WITH D) 500-200 MG-UNIT tablet Take 1 tablet by mouth daily with breakfast. 60 tablet 2  . citalopram (CELEXA) 20 MG tablet TAKE ONE (1) TABLET BY MOUTH EVERY DAY 30 tablet 5  . diltiazem (CARDIZEM CD) 240 MG 24 hr capsule Take 1 capsule (240 mg total) by mouth daily. 90 capsule 3  . ferrous sulfate 325 (65 FE) MG tablet Take 325 mg by mouth daily with breakfast.    . fexofenadine (KP FEXOFENADINE HCL) 180 MG tablet Take 180 mg by mouth daily.    [name] warfarin (COUMADIN) 5 MG tablet TAKE ONE TABLELT ONCE DAILY EXCEPT TAKE 1/2 TABLET ON MONDAY,  WEDNESDAY ANDFRIDAY 30 tablet 4   No current facility-administered medications for this visit.     Physical Exam BP 124/85   Pulse 76   Resp 20   Ht 5' (1.524 m)   Wt 115 lb (52.2 kg)   SpO2 95% Comment: RA  BMI 22.30 kg/m  78 year old woman in no acute distress Alert and oriented x3 with no focal deficits No cervical or supraclavicular adenopathy Cardiac regular rate and rhythm normal S1 and S2 Lungs clear with equal breath sounds bilaterally, no rales or wheezing Abdomen soft nontender Extremities are without clubbing cyanosis or edema  Diagnostic Tests: NUCLEAR MEDICINE PET SKULL BASE TO THIGH  TECHNIQUE: 6.0 mCi F-18 FDG was injected intravenously. Full-ring PET imaging was performed from the skull base to thigh after the radiotracer. CT data was obtained and used for attenuation correction and  anatomic localization.  Fasting blood glucose: 85 mg/dl  COMPARISON:  Multiple exams, including 02/06/2014 and CT chest from 03/05/2018  FINDINGS: Mediastinal blood pool activity: SUV max 1.9  NECK: No significant abnormal hypermetabolic activity in this region.  Incidental CT findings: Acute on chronic bilateral maxillary sinusitis with chronic ethmoid sinusitis. Bilateral common carotid atherosclerotic calcifications.  CHEST: 3.3 by 2.8 cm left lower lobe mass has a maximum SUV 10.2, compatible with malignancy.  A rounded nodule in the left upper lobe measuring 1.0 cm in diameter on image 76/4 has a maximum SUV of 0.8. This nodule is unchanged from 02/06/2014.  A lower paratracheal node anterior to the carina measures 1.3 cm in diameter with maximum SUV 2.2. Back on 02/06/2014 this lesion measured 1.3 cm in diameter with a maximum SUV of 2.2.  Incidental CT findings: Coronary, aortic arch, and branch vessel atherosclerotic vascular disease. Cardiomegaly noted. Biapical pleuroparenchymal scarring. Centrilobular emphysema.  ABDOMEN/PELVIS: Small amount of high activity along the vaginal vestibule/perineum, probably from mild urinary incontinence. Adrenal glands normal.  Incidental CT findings: Hepatic steatosis. Postoperative findings in the left upper quadrant and involving the stomach. Multiple small accessory spleens. Photopenic renal cysts. Aortoiliac atherosclerotic vascular disease. Photopenic 2.0 by 1.5 cm cystic lesion of the left ovary, not appreciably changed from the prior PET-CT of 2016.  SKELETON: No significant abnormal hypermetabolic activity in this region.  Incidental CT findings: Severe arthropathy left hip.  IMPRESSION: 1. The 3.3 cm left lower lobe mass has a maximum SUV of 10.2, favoring bronchogenic carcinoma. No definite hypermetabolic adenopathy. A lower paratracheal node anterior to the carina is mildly prominent and with an  activity similar to blood pool, but had a similar size and activity back on 02/06/2014. 2. A chronic left upper lobe pulmonary nodule is not hypermetabolic and has been reported back through 2004, and is considered benign. 3. Other imaging findings of potential clinical significance: Acute on chronic bilateral maxillary sinusitis with chronic ethmoid sinusitis. Aortic Atherosclerosis (ICD10-I70.0) and Emphysema (ICD10-J43.9). Coronary atherosclerosis. Cardiomegaly. Hepatic steatosis. Postoperative findings in the left upper quadrant. Photopenic renal cysts. Photopenic left ovarian cyst. Severe degenerative arthropathy left hip.   Electronically Signed   By: Van Clines M.D.   On: 03/18/2018 15:35 I personally reviewed the PET CT and CT images and concur with the findings noted above  Pulmonary function testing FVC 2.00 (88%) FEV1 1.27 (75%) No change with bronchodilator DLCO 10.35 (62%)  Impression: Marissa Turner is a 78 year old woman with a history of ongoing tobacco abuse, COPD, chronic atrial fibrillation, peptic ulcer disease status post Billroth II, hypertension, arthritis, depression, and large cell lymphoma treated in 2016.  She was recently found to have a new left lower lobe lung mass that is 3.3 cm in diameter on CT.  This is markedly hypermetabolic on PET/CT.  Findings are consistent with a new primary bronchogenic carcinoma.  Lymphoma or other tumors are also in the differential.  This would be a very unusual appearance for granulomatous disease but it is not impossible for that to be the case.  In any event surgery is indicated for definitive diagnosis and treatment.  She also has a left upper lobe nodule.  That is consistent with a hamartoma and is not active on PET.  We will try to wedge that out at the time of surgery unless there are issues with trying to do that.  I described the general nature of the procedure to Marissa Turner and her husband.  We discussed the  need for general anesthesia, the incisions to be used, the use of a drainage tube postoperatively, the expected hospital stay, and the overall recovery.  They understand the indications, risk, benefits, and alternatives.  They understand the risks include, but not limited to death, MI, DVT, PE, bleeding, possible need for transfusion, stroke, prolonged air leak, cardiac arrhythmias, as well as the possibility of other unforeseeable complications.  She is on Coumadin.  She needs to hold that for 5 days prior to surgery.  She will check with cardiology to see if they want to bridge her with Lovenox.  Plan: Left VATS for left lower lobectomy and possible wedge of the left upper lobe nodule on Thursday, 04/15/2018. Hold Coumadin for 5 days prior to procedure  Melrose Nakayama, MD [facility] [phone]

## 2018-04-06 NOTE — H&P (View-Only) (Signed)
[address]       [place] [address]             [phone]     HPI: Marissa Turner returns to discuss the results of her pulmonary function testing   Marissa Turner is a 78 year old woman with a history of tobacco abuse, large cell lymphoma treated in 2015, hypertension, chronic atrial fibrillation, Billroth II for ulcer, and degenerative joint disease.  She was being evaluated for a left hip replacement due to severe hip pain.  She ultimately ended up having a chest CT.  She is not exactly sure why but probably due to an abnormal chest x-ray.  She has a 3.3cm lobulated mass in the left lower lobe that was new from her previous study.  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  PET/CT showed the left lower lobe mass was hypermetabolic with an SUV of 10.  There was a 13 mm right paratracheal node that was mildly active with an SUV of 2.2 which was unchanged from previous PET back in 2016.  The left upper lobe nodule was not active.  Marissa Turner brain was negative for metastatic disease.  She really has not been able to exercise much due to her severe hip pain.  That is her primary complaint and remains so.  She is not having any chest pain, pressure, tightness, or shortness of breath.  She had a nuclear stress test done back in December.  It was a low risk study with normal left ventricular function and she was cleared for hip surgery.  Past Medical History:  Diagnosis Date  . Atrial fibrillation, chronic 2004   left atrial thrombus in 2004; near syncope in 2008; echocardiogram in 2008-normal EF, mild left atrial enlargement, no valvular abnormalities, lipomatous hypertrophy; no aortic atheroma on TEE in 2004  . Cancer (HCC)    LYMPH NODE  . Chronic anticoagulation 2004   warfarin  . Depression   . DJD (degenerative joint disease), lumbosacral    Also hip  . Dysrhythmia    chronic AFib  . Fracture of wrist    Left  . Hypertension    Normal  carotid ultrasound in 2008  . Peptic ulcer disease    s/p Billroth II  . PONV (postoperative nausea and vomiting)   . Tobacco abuse    40 pack years; quit attempt in 2012    Current Outpatient Medications  Medication Sig Dispense Refill  . acetaminophen (TYLENOL) 500 MG tablet Take 1,000 mg by mouth daily as needed for headache.    . ALPRAZolam (XANAX) 1 MG tablet Take 1 tablet (1 mg total) by mouth 3 (three) times daily as needed for anxiety. (Patient taking differently: Take 1 mg by mouth daily. ) 30 tablet 0  . calcium-vitamin D (OSCAL WITH D) 500-200 MG-UNIT tablet Take 1 tablet by mouth daily with breakfast. 60 tablet 2  . citalopram (CELEXA) 20 MG tablet TAKE ONE (1) TABLET BY MOUTH EVERY DAY 30 tablet 5  . diltiazem (CARDIZEM CD) 240 MG 24 hr capsule Take 1 capsule (240 mg total) by mouth daily. 90 capsule 3  . ferrous sulfate 325 (65 FE) MG tablet Take 325 mg by mouth daily with breakfast.    . fexofenadine (KP FEXOFENADINE HCL) 180 MG tablet Take 180 mg by mouth daily.    [name] warfarin (COUMADIN) 5 MG tablet TAKE ONE TABLELT ONCE DAILY EXCEPT TAKE 1/2 TABLET ON MONDAY,  WEDNESDAY ANDFRIDAY 30 tablet 4   No current facility-administered medications for this visit.     Physical Exam BP 124/85   Pulse 76   Resp 20   Ht 5' (1.524 m)   Wt 115 lb (52.2 kg)   SpO2 95% Comment: RA  BMI 22.90 kg/m  78 year old woman in no acute distress Alert and oriented x3 with no focal deficits No cervical or supraclavicular adenopathy Cardiac regular rate and rhythm normal S1 and S2 Lungs clear with equal breath sounds bilaterally, no rales or wheezing Abdomen soft nontender Extremities are without clubbing cyanosis or edema  Diagnostic Tests: NUCLEAR MEDICINE PET SKULL BASE TO THIGH  TECHNIQUE: 6.0 mCi F-18 FDG was injected intravenously. Full-ring PET imaging was performed from the skull base to thigh after the radiotracer. CT data was obtained and used for attenuation correction and  anatomic localization.  Fasting blood glucose: 85 mg/dl  COMPARISON:  Multiple exams, including 02/06/2014 and CT chest from 03/05/2018  FINDINGS: Mediastinal blood pool activity: SUV max 1.9  NECK: No significant abnormal hypermetabolic activity in this region.  Incidental CT findings: Acute on chronic bilateral maxillary sinusitis with chronic ethmoid sinusitis. Bilateral common carotid atherosclerotic calcifications.  CHEST: 3.3 by 2.8 cm left lower lobe mass has a maximum SUV 10.2, compatible with malignancy.  A rounded nodule in the left upper lobe measuring 1.0 cm in diameter on image 76/4 has a maximum SUV of 0.8. This nodule is unchanged from 02/06/2014.  A lower paratracheal node anterior to the carina measures 1.3 cm in diameter with maximum SUV 2.2. Back on 02/06/2014 this lesion measured 1.3 cm in diameter with a maximum SUV of 2.2.  Incidental CT findings: Coronary, aortic arch, and branch vessel atherosclerotic vascular disease. Cardiomegaly noted. Biapical pleuroparenchymal scarring. Centrilobular emphysema.  ABDOMEN/PELVIS: Small amount of high activity along the vaginal vestibule/perineum, probably from mild urinary incontinence. Adrenal glands normal.  Incidental CT findings: Hepatic steatosis. Postoperative findings in the left upper quadrant and involving the stomach. Multiple small accessory spleens. Photopenic renal cysts. Aortoiliac atherosclerotic vascular disease. Photopenic 2.0 by 1.5 cm cystic lesion of the left ovary, not appreciably changed from the prior PET-CT of 2016.  SKELETON: No significant abnormal hypermetabolic activity in this region.  Incidental CT findings: Severe arthropathy left hip.  IMPRESSION: 1. The 3.3 cm left lower lobe mass has a maximum SUV of 10.2, favoring bronchogenic carcinoma. No definite hypermetabolic adenopathy. A lower paratracheal node anterior to the carina is mildly prominent and with an  activity similar to blood pool, but had a similar size and activity back on 02/06/2014. 2. A chronic left upper lobe pulmonary nodule is not hypermetabolic and has been reported back through 2004, and is considered benign. 3. Other imaging findings of potential clinical significance: Acute on chronic bilateral maxillary sinusitis with chronic ethmoid sinusitis. Aortic Atherosclerosis (ICD10-I70.0) and Emphysema (ICD10-J43.9). Coronary atherosclerosis. Cardiomegaly. Hepatic steatosis. Postoperative findings in the left upper quadrant. Photopenic renal cysts. Photopenic left ovarian cyst. Severe degenerative arthropathy left hip.   Electronically Signed   By: Van Clines M.D.   On: 03/18/2018 15:35 I personally reviewed the PET CT and CT images and concur with the findings noted above  Pulmonary function testing FVC 2.00 (88%) FEV1 1.27 (75%) No change with bronchodilator DLCO 10.35 (62%)  Impression: Marissa Turner is a 78 year old woman with a history of ongoing tobacco abuse, COPD, chronic atrial fibrillation, peptic ulcer disease status post Billroth II, hypertension, arthritis, depression, and large cell lymphoma treated in 2016.  She was recently found to have a new left lower lobe lung mass that is 3.3 cm in diameter on CT.  This is markedly hypermetabolic on PET/CT.  Findings are consistent with a new primary bronchogenic carcinoma.  Lymphoma or other tumors are also in the differential.  This would be a very unusual appearance for granulomatous disease but it is not impossible for that to be the case.  In any event surgery is indicated for definitive diagnosis and treatment.  She also has a left upper lobe nodule.  That is consistent with a hamartoma and is not active on PET.  We will try to wedge that out at the time of surgery unless there are issues with trying to do that.  I described the general nature of the procedure to Marissa Turner and her husband.  We discussed the  need for general anesthesia, the incisions to be used, the use of a drainage tube postoperatively, the expected hospital stay, and the overall recovery.  They understand the indications, risk, benefits, and alternatives.  They understand the risks include, but not limited to death, MI, DVT, PE, bleeding, possible need for transfusion, stroke, prolonged air leak, cardiac arrhythmias, as well as the possibility of other unforeseeable complications.  She is on Coumadin.  She needs to hold that for 5 days prior to surgery.  She will check with cardiology to see if they want to bridge her with Lovenox.  Plan: Left VATS for left lower lobectomy and possible wedge of the left upper lobe nodule on Thursday, 04/15/2018. Hold Coumadin for 5 days prior to procedure  Melrose Nakayama, MD [facility] [phone]

## 2018-04-08 ENCOUNTER — Telehealth: Payer: Self-pay | Admitting: *Deleted

## 2018-04-08 NOTE — Telephone Encounter (Signed)
-----   Message from Laury Deep, RN sent at 04/08/2018  2:04 PM EDT ----- Regarding: RE: surgery Thanks Lattie Haw.  Ryan ----- Message ----- From: Malen Gauze, RN Sent: 04/08/2018  12:55 PM EDT To: Laury Deep, RN Subject: RE: surgery                                     ----- Message ----- From: Leeroy Bock, [facility] Sent: 04/07/2018   4:39 PM EDT To: Malen Gauze, RN Subject: RE: surgery                                    Yes she's ok to hold without a bridge.  Thanks, Jinny Blossom ----- Message ----- From: Malen Gauze, RN Sent: 04/07/2018   4:28 PM EDT To: Leeroy Bock, RPH Subject: surgery                                        OK to hold coumadin x 5 days without Lovenox bridge? ----- Message ----- From: Drema Dallas, CMA Sent: 04/07/2018  11:06 AM EDT To: Malen Gauze, RN   ----- Message ----- From: Laury Deep, RN Sent: 04/07/2018  10:56 AM EDT To: Drema Dallas, CMA  Hey,  This patient is scheduled for lobectomy next week and we are holding her coumadin 5 days prior.  Patient wanted me to tell you that in case she needs Lovenox bridge.  Can you address?  Thanks, [employer]

## 2018-04-12 NOTE — Pre-Procedure Instructions (Signed)
Marissa Turner  04/12/2018      [place] [place] [address] Phone: [phone] Fax: [phone]    Your procedure is scheduled on March 19th, 2020.  Report to [facility] Admitting at 06:00 A.M.  Call this number if you have problems the morning of surgery:  [phone]   Remember:  Do not eat or drink after midnight. This includes gum and hard candy.    Take these medicines the morning of surgery with A SIP OF WATER: Celexa, Diltiazem, and you may take Tylenol and Xanax if needed.    Do not wear jewelry, make-up or nail polish.  Do not wear lotions, powders, or perfumes, or deodorant.  Do not shave 48 hours prior to surgery.  Men may shave face and neck.  Do not bring valuables to the hospital.  [facility] is not responsible for any belongings or valuables.  Contacts, dentures or bridgework may not be worn into surgery.  Leave your suitcase in the car.  After surgery it may be brought to your room.  For patients admitted to the hospital, discharge time will be determined by your treatment team.  Patients discharged the day of surgery will not be allowed to drive home.    [facility]- Preparing For Surgery  Before surgery, you can play an important role. Because skin is not sterile, your skin needs to be as free of germs as possible. You can reduce the number of germs on your skin by washing with CHG (chlorahexidine gluconate) Soap before surgery.  CHG is an antiseptic cleaner which kills germs and bonds with the skin to continue killing germs even after washing.    Oral Hygiene is also important to reduce your risk of infection.  Remember - BRUSH YOUR TEETH THE MORNING OF SURGERY WITH YOUR REGULAR TOOTHPASTE  Please do not use if you have an allergy to CHG or antibacterial soaps. If your skin becomes reddened/irritated stop using the CHG.  Do not shave (including legs and underarms) for at least  48 hours prior to first CHG shower. It is OK to shave your face.  Please follow these instructions carefully.   1. Shower the NIGHT BEFORE SURGERY and the MORNING OF SURGERY with CHG.   2. If you chose to wash your hair, wash your hair first as usual with your normal shampoo.  3. After you shampoo, rinse your hair and body thoroughly to remove the shampoo.  4. Use CHG as you would any other liquid soap. You can apply CHG directly to the skin and wash gently with a scrungie or a clean washcloth.   5. Apply the CHG Soap to your body ONLY FROM THE NECK DOWN.  Do not use on open wounds or open sores. Avoid contact with your eyes, ears, mouth and genitals (private parts). Wash Face and genitals (private parts)  with your normal soap.  6. Wash thoroughly, paying special attention to the area where your surgery will be performed.  7. Thoroughly rinse your body with warm water from the neck down.  8. DO NOT shower/wash with your normal soap after using and rinsing off the CHG Soap.  9. Pat yourself dry with a CLEAN TOWEL.  10. Wear CLEAN PAJAMAS to bed the night before surgery, wear comfortable clothes the morning of surgery  11. Place CLEAN SHEETS on your bed the night of your first shower and DO NOT SLEEP  WITH PETS.    Day of Surgery:  Do not apply any deodorants/lotions.  Please wear clean clothes to the hospital/surgery center.   Remember to brush your teeth WITH YOUR REGULAR TOOTHPASTE.

## 2018-04-13 ENCOUNTER — Encounter (HOSPITAL_COMMUNITY): Payer: Self-pay

## 2018-04-13 ENCOUNTER — Other Ambulatory Visit: Payer: Self-pay

## 2018-04-13 ENCOUNTER — Encounter (HOSPITAL_COMMUNITY)
Admission: RE | Admit: 2018-04-13 | Discharge: 2018-04-13 | Disposition: A | Discharge status: Home / Self Care | Payer: Medicare Other | Source: Ambulatory Visit | Attending: Thoracic Surgery (Cardiothoracic Vascular Surgery) | Admitting: Thoracic Surgery (Cardiothoracic Vascular Surgery)

## 2018-04-13 DIAGNOSIS — R911 Solitary pulmonary nodule: Secondary | ICD-10-CM

## 2018-04-13 DIAGNOSIS — J9383 Other pneumothorax: Secondary | ICD-10-CM | POA: Diagnosis not present

## 2018-04-13 DIAGNOSIS — I482 Chronic atrial fibrillation, unspecified: Secondary | ICD-10-CM | POA: Diagnosis not present

## 2018-04-13 DIAGNOSIS — K760 Fatty (change of) liver, not elsewhere classified: Secondary | ICD-10-CM | POA: Diagnosis not present

## 2018-04-13 DIAGNOSIS — J432 Centrilobular emphysema: Secondary | ICD-10-CM | POA: Diagnosis not present

## 2018-04-13 DIAGNOSIS — Z01818 Encounter for other preprocedural examination: Secondary | ICD-10-CM | POA: Diagnosis not present

## 2018-04-13 DIAGNOSIS — R918 Other nonspecific abnormal finding of lung field: Secondary | ICD-10-CM

## 2018-04-13 DIAGNOSIS — I700 Atherosclerosis of aorta: Secondary | ICD-10-CM | POA: Diagnosis not present

## 2018-04-13 DIAGNOSIS — I2510 Atherosclerotic heart disease of native coronary artery without angina pectoris: Secondary | ICD-10-CM | POA: Diagnosis not present

## 2018-04-13 DIAGNOSIS — J90 Pleural effusion, not elsewhere classified: Secondary | ICD-10-CM | POA: Diagnosis not present

## 2018-04-13 DIAGNOSIS — I119 Hypertensive heart disease without heart failure: Secondary | ICD-10-CM | POA: Diagnosis not present

## 2018-04-13 DIAGNOSIS — Q6102 Congenital multiple renal cysts: Secondary | ICD-10-CM | POA: Diagnosis not present

## 2018-04-13 DIAGNOSIS — C3432 Malignant neoplasm of lower lobe, left bronchus or lung: Secondary | ICD-10-CM | POA: Diagnosis not present

## 2018-04-13 DIAGNOSIS — J439 Emphysema, unspecified: Secondary | ICD-10-CM | POA: Diagnosis not present

## 2018-04-13 DIAGNOSIS — J9382 Other air leak: Secondary | ICD-10-CM | POA: Diagnosis not present

## 2018-04-13 DIAGNOSIS — D1431 Benign neoplasm of right bronchus and lung: Secondary | ICD-10-CM | POA: Diagnosis not present

## 2018-04-13 HISTORY — DX: Hypotension, unspecified: I95.9

## 2018-04-13 HISTORY — DX: Panic disorder (episodic paroxysmal anxiety): F41.0

## 2018-04-13 LAB — COMPREHENSIVE METABOLIC PANEL
ALK PHOS: 68 U/L (ref 38–126)
ALT: 18 U/L (ref 0–44)
AST: 22 U/L (ref 15–41)
Albumin: 3.5 g/dL (ref 3.5–5.0)
Anion gap: 7 (ref 5–15)
BUN: 12 mg/dL (ref 8–23)
CO2: 26 mmol/L (ref 22–32)
Calcium: 8.7 mg/dL — ABNORMAL LOW (ref 8.9–10.3)
Chloride: 105 mmol/L (ref 98–111)
Creatinine, Ser: 0.59 mg/dL (ref 0.44–1.00)
GFR calc Af Amer: >60 mL/min (ref >60)
GFR calc non Af Amer: >60 mL/min (ref >60)
Glucose, Bld: 87 mg/dL (ref 70–99)
Potassium: 3.9 mmol/L (ref 3.5–5.1)
Sodium: 138 mmol/L (ref 135–145)
Total Bilirubin: 0.5 mg/dL (ref 0.3–1.2)
Total Protein: 6.5 g/dL (ref 6.5–8.1)

## 2018-04-13 LAB — URINALYSIS, ROUTINE W REFLEX MICROSCOPIC
Bilirubin Urine: NEGATIVE
Glucose, UA: NEGATIVE mg/dL
Ketones, ur: NEGATIVE mg/dL
NITRITE: NEGATIVE
Protein, ur: NEGATIVE mg/dL
Specific Gravity, Urine: 1.028 (ref 1.005–1.030)
pH: 5 (ref 5.0–8.0)

## 2018-04-13 LAB — CBC
HCT: 39.1 % (ref 36.0–46.0)
Hemoglobin: 12.6 g/dL (ref 12.0–15.0)
MCH: 28.5 pg (ref 26.0–34.0)
MCHC: 32.2 g/dL (ref 30.0–36.0)
MCV: 88.5 fL (ref 80.0–100.0)
NRBC: 0 % (ref 0.0–0.2)
Platelets: 282 10*3/uL (ref 150–400)
RBC: 4.42 MIL/uL (ref 3.87–5.11)
RDW: 15.6 % — ABNORMAL HIGH (ref 11.5–15.5)
WBC: 9.9 10*3/uL (ref 4.0–10.5)

## 2018-04-13 LAB — BLOOD GAS, ARTERIAL
Acid-Base Excess: 3.8 mmol/L — ABNORMAL HIGH (ref 0.0–2.0)
Bicarbonate: 27.7 mmol/L (ref 20.0–28.0)
Drawn by: 4218011
FIO2: 21
O2 Saturation: 96.5 %
PH ART: 7.442 (ref 7.350–7.450)
Patient temperature: 98.6
pCO2 arterial: 41.2 mmHg (ref 32.0–48.0)
pO2, Arterial: 93.1 mmHg (ref 83.0–108.0)

## 2018-04-13 LAB — SURGICAL PCR SCREEN
MRSA, PCR: NEGATIVE
Staphylococcus aureus: NEGATIVE

## 2018-04-13 LAB — ABO/RH: ABO/RH(D): A POS

## 2018-04-13 LAB — PROTIME-INR
INR: 1.3 — ABNORMAL HIGH (ref 0.8–1.2)
Prothrombin Time: 16 seconds — ABNORMAL HIGH (ref 11.4–15.2)

## 2018-04-13 LAB — TYPE AND SCREEN
ABO/RH(D): A POS
Antibody Screen: NEGATIVE

## 2018-04-13 LAB — APTT: aPTT: 33 seconds (ref 24–36)

## 2018-04-13 NOTE — Progress Notes (Signed)
PCP - Celene Squibb Cardiologist - Koneswaran  Chest x-ray - 04/13/18 EKG - 04/13/18 Stress Test - 01/12/18 ECHO - denies Cardiac Cath - denies   Blood Thinner Instructions: Coumadin last dose 04/10/18 Aspirin Instructions: n/a  Anesthesia review: yes, lung disease  Patient denies shortness of breath, fever, cough and chest pain at PAT appointment   Patient verbalized understanding of instructions that were given to them at the PAT appointment. Patient was also instructed that they will need to review over the PAT instructions again at home before surgery.

## 2018-04-13 NOTE — Pre-Procedure Instructions (Signed)
DARENE NAPPI  04/13/2018      [address], [place] [address] Phone: [phone] Fax: [phone]    Your procedure is scheduled on March 19th, 2020.  Report to [facility] Admitting at 06:00 A.M.  Call this number if you have problems the morning of surgery:  [phone]   Remember:  Do not eat or drink after midnight. This includes gum and hard candy.    Take these medicines the morning of surgery with A SIP OF WATER:  acetaminophen (TYLENOL) if needed ALPRAZolam Duanne Moron) if needed citalopram (CELEXA)  diltiazem (CARDIZEM CD  7 days prior to surgery STOP taking any Aspirin (unless otherwise instructed by your surgeon), Aleve, Naproxen, Ibuprofen, Motrin, Advil, Goody's, BC's, all herbal medications, fish oil, and all vitamins.  Follow your surgeon's instructions on when to stop Coumadin.  If no instructions were given by your surgeon then you will need to call the office to get those instructions.     Do not wear jewelry, make-up or nail polish.  Do not wear lotions, powders, or perfumes, or deodorant.  Do not shave 48 hours prior to surgery.  Men may shave face and neck.  Do not bring valuables to the hospital.  [facility] is not responsible for any belongings or valuables.  Contacts, dentures or bridgework may not be worn into surgery.  Leave your suitcase in the car.  After surgery it may be brought to your room.  For patients admitted to the hospital, discharge time will be determined by your treatment team.  Patients discharged the day of surgery will not be allowed to drive home.    [facility]- Preparing For Surgery  Before surgery, you can play an important role. Because skin is not sterile, your skin needs to be as free of germs as possible. You can reduce the number of germs on your skin by washing with CHG (chlorahexidine gluconate) Soap before surgery.  CHG is an antiseptic cleaner  which kills germs and bonds with the skin to continue killing germs even after washing.    Oral Hygiene is also important to reduce your risk of infection.  Remember - BRUSH YOUR TEETH THE MORNING OF SURGERY WITH YOUR REGULAR TOOTHPASTE  Please do not use if you have an allergy to CHG or antibacterial soaps. If your skin becomes reddened/irritated stop using the CHG.  Do not shave (including legs and underarms) for at least 48 hours prior to first CHG shower. It is OK to shave your face.  Please follow these instructions carefully.   1. Shower the NIGHT BEFORE SURGERY and the MORNING OF SURGERY with CHG.   2. If you chose to wash your hair, wash your hair first as usual with your normal shampoo.  3. After you shampoo, rinse your hair and body thoroughly to remove the shampoo.  4. Use CHG as you would any other liquid soap. You can apply CHG directly to the skin and wash gently with a scrungie or a clean washcloth.   5. Apply the CHG Soap to your body ONLY FROM THE NECK DOWN.  Do not use on open wounds or open sores. Avoid contact with your eyes, ears, mouth and genitals (private parts). Wash Face and genitals (private parts)  with your normal soap.  6. Wash thoroughly, paying special attention to the area where your surgery will be performed.  7. Thoroughly rinse your body with warm  water from the neck down.  8. DO NOT shower/wash with your normal soap after using and rinsing off the CHG Soap.  9. Pat yourself dry with a CLEAN TOWEL.  10. Wear CLEAN PAJAMAS to bed the night before surgery, wear comfortable clothes the morning of surgery  11. Place CLEAN SHEETS on your bed the night of your first shower and DO NOT SLEEP WITH PETS.    Day of Surgery:  Do not apply any deodorants/lotions.  Please wear clean clothes to the hospital/surgery center.   Remember to brush your teeth WITH YOUR REGULAR TOOTHPASTE.

## 2018-04-14 NOTE — Anesthesia Preprocedure Evaluation (Addendum)
Anesthesia Evaluation  Patient identified by MRN, date of birth, ID band Patient awake    Reviewed: Allergy & Precautions, NPO status , Patient's Chart, lab work & pertinent test results  History of Anesthesia Complications (+) PONV and history of anesthetic complications  Airway Mallampati: I  TM Distance: >3 FB Neck ROM: Full    Dental  (+) Edentulous Upper, Edentulous Lower, Dental Advisory Given   Pulmonary Current Smoker,    Pulmonary exam normal        Cardiovascular hypertension, Pt. on medications Normal cardiovascular exam+ dysrhythmias Atrial Fibrillation      Neuro/Psych PSYCHIATRIC DISORDERS Anxiety Depression negative neurological ROS     GI/Hepatic Neg liver ROS, PUD,   Endo/Other  negative endocrine ROS  Renal/GU negative Renal ROS     Musculoskeletal negative musculoskeletal ROS (+)   Abdominal   Peds  Hematology negative hematology ROS (+)   Anesthesia Other Findings Day of surgery medications reviewed with the patient.  Reproductive/Obstetrics                           Anesthesia Physical Anesthesia Plan  ASA: III  Anesthesia Plan: General   Post-op Pain Management:    Induction: Intravenous  PONV Risk Score and Plan: 4 or greater and Ondansetron, Dexamethasone, Diphenhydramine and Treatment may vary due to age or medical condition  Airway Management Planned: Double Lumen EBT  Additional Equipment: Arterial line and CVP  Intra-op Plan:   Post-operative Plan: Possible Post-op intubation/ventilation  Informed Consent: I have reviewed the patients History and Physical, chart, labs and discussed the procedure including the risks, benefits and alternatives for the proposed anesthesia with the patient or authorized representative who has indicated his/her understanding and acceptance.     Dental advisory given  Plan Discussed with: Anesthesiologist and  CRNA  Anesthesia Plan Comments: (History of afib, seen by Dr. Bronson Ing for preop risk stratification. She had a low risk nuclear stress 12/2017 and was cleared for surgery "Low risk study overall.  Some suggestion of possible prior heart attack but pumping function of heart is normal.  The pictures were also affected by some shadowing from abdominal organs.  She can proceed with surgery as planned."  Nuclear stress 01/12/18  There was no ST segment deviation noted during stress.  This is a low risk study.  The left ventricular ejection fraction is normal (55-65%).  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 This area may be affected by artifact due to adjacent gut radiotracer uptake. Overall findings are low risk)      Anesthesia Quick Evaluation

## 2018-04-15 ENCOUNTER — Inpatient Hospital Stay (HOSPITAL_COMMUNITY): Payer: Medicare Other | Admitting: Physician Assistant

## 2018-04-15 ENCOUNTER — Encounter (HOSPITAL_COMMUNITY): Payer: Self-pay | Admitting: *Deleted

## 2018-04-15 ENCOUNTER — Other Ambulatory Visit: Payer: Self-pay

## 2018-04-15 ENCOUNTER — Inpatient Hospital Stay (HOSPITAL_COMMUNITY)
Admission: RE | Admit: 2018-04-15 | Discharge: 2018-04-20 | DRG: 164 | Disposition: A | Discharge status: Home / Self Care | Payer: Medicare Other | Attending: Thoracic Surgery (Cardiothoracic Vascular Surgery) | Admitting: Thoracic Surgery (Cardiothoracic Vascular Surgery)

## 2018-04-15 ENCOUNTER — Inpatient Hospital Stay (HOSPITAL_COMMUNITY): Payer: Medicare Other | Admitting: Anesthesiology

## 2018-04-15 ENCOUNTER — Encounter (HOSPITAL_COMMUNITY)
Admission: RE | Disposition: A | Discharge status: Home / Self Care | Payer: Self-pay | Source: Home / Self Care | Attending: Thoracic Surgery (Cardiothoracic Vascular Surgery)

## 2018-04-15 ENCOUNTER — Inpatient Hospital Stay (HOSPITAL_COMMUNITY): Payer: Medicare Other

## 2018-04-15 DIAGNOSIS — I119 Hypertensive heart disease without heart failure: Secondary | ICD-10-CM | POA: Diagnosis present

## 2018-04-15 DIAGNOSIS — N83202 Unspecified ovarian cyst, left side: Secondary | ICD-10-CM | POA: Diagnosis present

## 2018-04-15 DIAGNOSIS — Z9049 Acquired absence of other specified parts of digestive tract: Secondary | ICD-10-CM | POA: Diagnosis not present

## 2018-04-15 DIAGNOSIS — Z8572 Personal history of non-Hodgkin lymphomas: Secondary | ICD-10-CM | POA: Diagnosis not present

## 2018-04-15 DIAGNOSIS — Q859 Phakomatosis, unspecified: Secondary | ICD-10-CM

## 2018-04-15 DIAGNOSIS — Z902 Acquired absence of lung [part of]: Secondary | ICD-10-CM | POA: Diagnosis not present

## 2018-04-15 DIAGNOSIS — D1431 Benign neoplasm of right bronchus and lung: Secondary | ICD-10-CM | POA: Diagnosis present

## 2018-04-15 DIAGNOSIS — Z87891 Personal history of nicotine dependence: Secondary | ICD-10-CM

## 2018-04-15 DIAGNOSIS — I6523 Occlusion and stenosis of bilateral carotid arteries: Secondary | ICD-10-CM | POA: Diagnosis present

## 2018-04-15 DIAGNOSIS — J322 Chronic ethmoidal sinusitis: Secondary | ICD-10-CM | POA: Diagnosis present

## 2018-04-15 DIAGNOSIS — Z9689 Presence of other specified functional implants: Secondary | ICD-10-CM

## 2018-04-15 DIAGNOSIS — R590 Localized enlarged lymph nodes: Secondary | ICD-10-CM | POA: Diagnosis present

## 2018-04-15 DIAGNOSIS — F329 Major depressive disorder, single episode, unspecified: Secondary | ICD-10-CM | POA: Diagnosis present

## 2018-04-15 DIAGNOSIS — Z09 Encounter for follow-up examination after completed treatment for conditions other than malignant neoplasm: Secondary | ICD-10-CM

## 2018-04-15 DIAGNOSIS — M1612 Unilateral primary osteoarthritis, left hip: Secondary | ICD-10-CM | POA: Diagnosis present

## 2018-04-15 DIAGNOSIS — R918 Other nonspecific abnormal finding of lung field: Secondary | ICD-10-CM | POA: Diagnosis not present

## 2018-04-15 DIAGNOSIS — C3432 Malignant neoplasm of lower lobe, left bronchus or lung: Principal | ICD-10-CM | POA: Diagnosis present

## 2018-04-15 DIAGNOSIS — I2510 Atherosclerotic heart disease of native coronary artery without angina pectoris: Secondary | ICD-10-CM | POA: Diagnosis present

## 2018-04-15 DIAGNOSIS — C3492 Malignant neoplasm of unspecified part of left bronchus or lung: Secondary | ICD-10-CM

## 2018-04-15 DIAGNOSIS — J95811 Postprocedural pneumothorax: Secondary | ICD-10-CM | POA: Diagnosis not present

## 2018-04-15 DIAGNOSIS — Z888 Allergy status to other drugs, medicaments and biological substances status: Secondary | ICD-10-CM

## 2018-04-15 DIAGNOSIS — J9383 Other pneumothorax: Secondary | ICD-10-CM | POA: Diagnosis not present

## 2018-04-15 DIAGNOSIS — R0602 Shortness of breath: Secondary | ICD-10-CM | POA: Diagnosis not present

## 2018-04-15 DIAGNOSIS — Q6102 Congenital multiple renal cysts: Secondary | ICD-10-CM | POA: Diagnosis not present

## 2018-04-15 DIAGNOSIS — Z7901 Long term (current) use of anticoagulants: Secondary | ICD-10-CM

## 2018-04-15 DIAGNOSIS — J0101 Acute recurrent maxillary sinusitis: Secondary | ICD-10-CM | POA: Diagnosis present

## 2018-04-15 DIAGNOSIS — I482 Chronic atrial fibrillation, unspecified: Secondary | ICD-10-CM | POA: Diagnosis present

## 2018-04-15 DIAGNOSIS — I700 Atherosclerosis of aorta: Secondary | ICD-10-CM | POA: Diagnosis present

## 2018-04-15 DIAGNOSIS — Z8711 Personal history of peptic ulcer disease: Secondary | ICD-10-CM | POA: Diagnosis not present

## 2018-04-15 DIAGNOSIS — Z885 Allergy status to narcotic agent status: Secondary | ICD-10-CM

## 2018-04-15 DIAGNOSIS — Z4682 Encounter for fitting and adjustment of non-vascular catheter: Secondary | ICD-10-CM

## 2018-04-15 DIAGNOSIS — J320 Chronic maxillary sinusitis: Secondary | ICD-10-CM | POA: Diagnosis present

## 2018-04-15 DIAGNOSIS — J432 Centrilobular emphysema: Secondary | ICD-10-CM | POA: Diagnosis present

## 2018-04-15 DIAGNOSIS — J90 Pleural effusion, not elsewhere classified: Secondary | ICD-10-CM | POA: Diagnosis not present

## 2018-04-15 DIAGNOSIS — R911 Solitary pulmonary nodule: Secondary | ICD-10-CM | POA: Diagnosis not present

## 2018-04-15 DIAGNOSIS — J9382 Other air leak: Secondary | ICD-10-CM | POA: Diagnosis not present

## 2018-04-15 DIAGNOSIS — R222 Localized swelling, mass and lump, trunk: Secondary | ICD-10-CM | POA: Diagnosis not present

## 2018-04-15 DIAGNOSIS — Z79899 Other long term (current) drug therapy: Secondary | ICD-10-CM

## 2018-04-15 DIAGNOSIS — K760 Fatty (change of) liver, not elsewhere classified: Secondary | ICD-10-CM | POA: Diagnosis present

## 2018-04-15 DIAGNOSIS — Z452 Encounter for adjustment and management of vascular access device: Secondary | ICD-10-CM | POA: Diagnosis not present

## 2018-04-15 DIAGNOSIS — Z880 Allergy status to penicillin: Secondary | ICD-10-CM

## 2018-04-15 DIAGNOSIS — J984 Other disorders of lung: Secondary | ICD-10-CM | POA: Diagnosis not present

## 2018-04-15 HISTORY — PX: WEDGE RESECTION: SHX5070

## 2018-04-15 HISTORY — PX: THORACOSCOPY: SUR1347

## 2018-04-15 HISTORY — PX: VIDEO ASSISTED THORACOSCOPY (VATS)/ LOBECTOMY: SHX6169

## 2018-04-15 SURGERY — VIDEO ASSISTED THORACOSCOPY (VATS)/ LOBECTOMY
Anesthesia: General | Site: Chest | Laterality: Left

## 2018-04-15 MED ORDER — FENTANYL CITRATE (PF) 100 MCG/2ML IJ SOLN
INTRAMUSCULAR | Status: AC
  Filled 2018-04-15: qty 2

## 2018-04-15 MED ORDER — SUGAMMADEX SODIUM 200 MG/2ML IV SOLN
INTRAVENOUS | Status: DC | PRN
  Administered 2018-04-15: 104.4 mg via INTRAVENOUS

## 2018-04-15 MED ORDER — SUCCINYLCHOLINE CHLORIDE 20 MG/ML IJ SOLN
INTRAMUSCULAR | Status: DC | PRN
  Administered 2018-04-15: 120 mg via INTRAVENOUS

## 2018-04-15 MED ORDER — FERROUS SULFATE 325 (65 FE) MG PO TABS
325.0000 mg | ORAL_TABLET | Freq: Every day | ORAL | Status: DC
  Administered 2018-04-16 – 2018-04-20 (×5): 325 mg via ORAL
  Filled 2018-04-15 (×5): qty 1

## 2018-04-15 MED ORDER — ALBUMIN HUMAN 5 % IV SOLN
INTRAVENOUS | Status: DC | PRN
  Administered 2018-04-15: 10:00:00 via INTRAVENOUS

## 2018-04-15 MED ORDER — BUPIVACAINE HCL 0.5 % IJ SOLN
INTRAMUSCULAR | Status: DC | PRN
  Administered 2018-04-15: 30 mL

## 2018-04-15 MED ORDER — ONDANSETRON HCL 4 MG/2ML IJ SOLN
INTRAMUSCULAR | Status: DC | PRN
  Administered 2018-04-15: 4 mg via INTRAVENOUS

## 2018-04-15 MED ORDER — ROCURONIUM BROMIDE 100 MG/10ML IV SOLN
INTRAVENOUS | Status: DC | PRN
  Administered 2018-04-15: 30 mg via INTRAVENOUS
  Administered 2018-04-15: 20 mg via INTRAVENOUS

## 2018-04-15 MED ORDER — LACTATED RINGERS IV SOLN
INTRAVENOUS | Status: DC | PRN
  Administered 2018-04-15: 08:00:00 via INTRAVENOUS

## 2018-04-15 MED ORDER — SENNOSIDES-DOCUSATE SODIUM 8.6-50 MG PO TABS
1.0000 | ORAL_TABLET | Freq: Every day | ORAL | Status: DC
  Administered 2018-04-15 – 2018-04-18 (×4): 1 via ORAL
  Filled 2018-04-15 (×5): qty 1

## 2018-04-15 MED ORDER — SUGAMMADEX SODIUM 500 MG/5ML IV SOLN
INTRAVENOUS | Status: AC
  Filled 2018-04-15: qty 5

## 2018-04-15 MED ORDER — PROPOFOL 10 MG/ML IV BOLUS
INTRAVENOUS | Status: DC | PRN
  Administered 2018-04-15: 100 mg via INTRAVENOUS
  Administered 2018-04-15: 30 mg via INTRAVENOUS

## 2018-04-15 MED ORDER — SODIUM CHLORIDE 0.9 % IV SOLN
INTRAVENOUS | Status: DC | PRN
  Administered 2018-04-15: 25 ug/min via INTRAVENOUS

## 2018-04-15 MED ORDER — FENTANYL CITRATE (PF) 250 MCG/5ML IJ SOLN
INTRAMUSCULAR | Status: AC
  Filled 2018-04-15: qty 5

## 2018-04-15 MED ORDER — DEXTROSE-NACL 5-0.9 % IV SOLN
INTRAVENOUS | Status: DC
  Administered 2018-04-15 – 2018-04-16 (×2): via INTRAVENOUS

## 2018-04-15 MED ORDER — LACTATED RINGERS IV SOLN
INTRAVENOUS | Status: DC
  Administered 2018-04-15: 07:00:00 via INTRAVENOUS

## 2018-04-15 MED ORDER — PHENYLEPHRINE 40 MCG/ML (10ML) SYRINGE FOR IV PUSH (FOR BLOOD PRESSURE SUPPORT)
PREFILLED_SYRINGE | INTRAVENOUS | Status: AC
  Filled 2018-04-15: qty 10

## 2018-04-15 MED ORDER — VANCOMYCIN HCL IN DEXTROSE 1-5 GM/200ML-% IV SOLN
1000.0000 mg | INTRAVENOUS | Status: AC
  Administered 2018-04-15: 1000 mg via INTRAVENOUS
  Filled 2018-04-15: qty 200

## 2018-04-15 MED ORDER — VANCOMYCIN HCL 1000 MG IV SOLR
1000.0000 mg | Freq: Two times a day (BID) | INTRAVENOUS | Status: AC
  Administered 2018-04-15: 1000 mg via INTRAVENOUS
  Filled 2018-04-15: qty 1000

## 2018-04-15 MED ORDER — DIPHENHYDRAMINE HCL 12.5 MG/5ML PO ELIX
12.5000 mg | ORAL_SOLUTION | Freq: Four times a day (QID) | ORAL | Status: DC | PRN
  Filled 2018-04-15: qty 5

## 2018-04-15 MED ORDER — ONDANSETRON HCL 4 MG/2ML IJ SOLN: 4.0000 mg | Freq: Four times a day (QID) | INTRAMUSCULAR | Status: DC | PRN

## 2018-04-15 MED ORDER — ROCURONIUM BROMIDE 50 MG/5ML IV SOSY
PREFILLED_SYRINGE | INTRAVENOUS | Status: AC
  Filled 2018-04-15: qty 10

## 2018-04-15 MED ORDER — BUPIVACAINE LIPOSOME 1.3 % IJ SUSP
20.0000 mL | INTRAMUSCULAR | Status: AC
  Administered 2018-04-15: 20 mL
  Filled 2018-04-15: qty 20

## 2018-04-15 MED ORDER — ENOXAPARIN SODIUM 40 MG/0.4ML ~~LOC~~ SOLN
40.0000 mg | SUBCUTANEOUS | Status: DC
  Administered 2018-04-15 – 2018-04-19 (×5): 40 mg via SUBCUTANEOUS
  Filled 2018-04-15 (×5): qty 0.4

## 2018-04-15 MED ORDER — DIPHENHYDRAMINE HCL 50 MG/ML IJ SOLN: 12.5000 mg | Freq: Four times a day (QID) | INTRAMUSCULAR | Status: DC | PRN

## 2018-04-15 MED ORDER — LEVALBUTEROL HCL 0.63 MG/3ML IN NEBU
0.6300 mg | INHALATION_SOLUTION | Freq: Four times a day (QID) | RESPIRATORY_TRACT | Status: DC
  Administered 2018-04-15 – 2018-04-16 (×5): 0.63 mg via RESPIRATORY_TRACT
  Filled 2018-04-15 (×6): qty 3

## 2018-04-15 MED ORDER — BISACODYL 5 MG PO TBEC
10.0000 mg | DELAYED_RELEASE_TABLET | Freq: Every day | ORAL | Status: DC
  Administered 2018-04-17 – 2018-04-18 (×2): 10 mg via ORAL
  Filled 2018-04-15 (×6): qty 2

## 2018-04-15 MED ORDER — TRAMADOL HCL 50 MG PO TABS
50.0000 mg | ORAL_TABLET | Freq: Four times a day (QID) | ORAL | Status: DC | PRN
  Administered 2018-04-19: 50 mg via ORAL
  Filled 2018-04-15: qty 1

## 2018-04-15 MED ORDER — MIDAZOLAM HCL 2 MG/2ML IJ SOLN
INTRAMUSCULAR | Status: AC
  Filled 2018-04-15: qty 2

## 2018-04-15 MED ORDER — DEXAMETHASONE SODIUM PHOSPHATE 4 MG/ML IJ SOLN
INTRAMUSCULAR | Status: DC | PRN
  Administered 2018-04-15: 5 mg via INTRAVENOUS

## 2018-04-15 MED ORDER — EPHEDRINE 5 MG/ML INJ
INTRAVENOUS | Status: AC
  Filled 2018-04-15: qty 10

## 2018-04-15 MED ORDER — MIDAZOLAM HCL 5 MG/5ML IJ SOLN
INTRAMUSCULAR | Status: DC | PRN
  Administered 2018-04-15: 0.5 mg via INTRAVENOUS

## 2018-04-15 MED ORDER — PHENYLEPHRINE HCL 10 MG/ML IJ SOLN
INTRAMUSCULAR | Status: DC | PRN
  Administered 2018-04-15 (×2): 80 ug via INTRAVENOUS

## 2018-04-15 MED ORDER — PROMETHAZINE HCL 25 MG/ML IJ SOLN: 6.2500 mg | INTRAMUSCULAR | Status: DC | PRN

## 2018-04-15 MED ORDER — BUPIVACAINE HCL (PF) 0.5 % IJ SOLN
INTRAMUSCULAR | Status: AC
  Filled 2018-04-15: qty 30

## 2018-04-15 MED ORDER — ALPRAZOLAM 0.5 MG PO TABS
1.0000 mg | ORAL_TABLET | Freq: Three times a day (TID) | ORAL | Status: DC | PRN
  Administered 2018-04-16 – 2018-04-20 (×7): 1 mg via ORAL
  Filled 2018-04-15 (×7): qty 2

## 2018-04-15 MED ORDER — NALOXONE HCL 0.4 MG/ML IJ SOLN: 0.4000 mg | INTRAMUSCULAR | Status: DC | PRN

## 2018-04-15 MED ORDER — HYDROMORPHONE HCL 2 MG PO TABS
2.0000 mg | ORAL_TABLET | ORAL | Status: DC | PRN
  Administered 2018-04-17 (×3): 2 mg via ORAL
  Filled 2018-04-15 (×4): qty 1

## 2018-04-15 MED ORDER — METOCLOPRAMIDE HCL 5 MG/ML IJ SOLN
10.0000 mg | Freq: Four times a day (QID) | INTRAMUSCULAR | Status: AC
  Administered 2018-04-15 – 2018-04-16 (×4): 10 mg via INTRAVENOUS
  Filled 2018-04-15 (×4): qty 2

## 2018-04-15 MED ORDER — NICOTINE 7 MG/24HR TD PT24
7.0000 mg | MEDICATED_PATCH | Freq: Every day | TRANSDERMAL | Status: DC
  Administered 2018-04-15 – 2018-04-20 (×6): 7 mg via TRANSDERMAL
  Filled 2018-04-15 (×8): qty 1

## 2018-04-15 MED ORDER — SUCCINYLCHOLINE CHLORIDE 200 MG/10ML IV SOSY
PREFILLED_SYRINGE | INTRAVENOUS | Status: AC
  Filled 2018-04-15: qty 10

## 2018-04-15 MED ORDER — SODIUM CHLORIDE (PF) 0.9 % IJ SOLN
INTRAMUSCULAR | Status: DC | PRN
  Administered 2018-04-15: 50 mL via INTRAVENOUS

## 2018-04-15 MED ORDER — ACETAMINOPHEN 160 MG/5ML PO SOLN: 1000.0000 mg | Freq: Four times a day (QID) | ORAL | Status: DC

## 2018-04-15 MED ORDER — SODIUM CHLORIDE 0.9% FLUSH: 9.0000 mL | INTRAVENOUS | Status: DC | PRN

## 2018-04-15 MED ORDER — ONDANSETRON HCL 4 MG/2ML IJ SOLN
INTRAMUSCULAR | Status: AC
  Filled 2018-04-15: qty 2

## 2018-04-15 MED ORDER — PROPOFOL 10 MG/ML IV BOLUS
INTRAVENOUS | Status: AC
  Filled 2018-04-15: qty 20

## 2018-04-15 MED ORDER — ACETAMINOPHEN 500 MG PO TABS
1000.0000 mg | ORAL_TABLET | Freq: Four times a day (QID) | ORAL | Status: DC
  Administered 2018-04-15 – 2018-04-20 (×14): 1000 mg via ORAL
  Filled 2018-04-15 (×17): qty 2

## 2018-04-15 MED ORDER — HEMOSTATIC AGENTS (NO CHARGE) OPTIME
TOPICAL | Status: DC | PRN
  Administered 2018-04-15: 1 via TOPICAL

## 2018-04-15 MED ORDER — FENTANYL CITRATE (PF) 100 MCG/2ML IJ SOLN
25.0000 ug | INTRAMUSCULAR | Status: DC | PRN
  Administered 2018-04-15: 25 ug via INTRAVENOUS

## 2018-04-15 MED ORDER — MORPHINE SULFATE 2 MG/ML IV SOLN: INTRAVENOUS | Status: DC

## 2018-04-15 MED ORDER — DEXAMETHASONE SODIUM PHOSPHATE 10 MG/ML IJ SOLN
INTRAMUSCULAR | Status: AC
  Filled 2018-04-15: qty 1

## 2018-04-15 MED ORDER — FENTANYL CITRATE (PF) 250 MCG/5ML IJ SOLN
INTRAMUSCULAR | Status: DC | PRN
  Administered 2018-04-15 (×5): 50 ug via INTRAVENOUS

## 2018-04-15 MED ORDER — CALCIUM CARBONATE-VITAMIN D 500-200 MG-UNIT PO TABS
1.0000 | ORAL_TABLET | Freq: Every day | ORAL | Status: DC
  Administered 2018-04-16 – 2018-04-20 (×5): 1 via ORAL
  Filled 2018-04-15 (×5): qty 1

## 2018-04-15 MED ORDER — CITALOPRAM HYDROBROMIDE 20 MG PO TABS
20.0000 mg | ORAL_TABLET | Freq: Every day | ORAL | Status: DC
  Administered 2018-04-16 – 2018-04-20 (×5): 20 mg via ORAL
  Filled 2018-04-15 (×5): qty 1

## 2018-04-15 MED ORDER — SODIUM CHLORIDE 0.9% IV SOLUTION: Freq: Once | INTRAVENOUS | Status: DC

## 2018-04-15 MED ORDER — DILTIAZEM HCL ER COATED BEADS 240 MG PO CP24
240.0000 mg | ORAL_CAPSULE | Freq: Every day | ORAL | Status: DC
  Administered 2018-04-16 – 2018-04-18 (×3): 240 mg via ORAL
  Filled 2018-04-15 (×3): qty 1

## 2018-04-15 MED ORDER — POTASSIUM CHLORIDE 10 MEQ/50ML IV SOLN: 10.0000 meq | Freq: Every day | INTRAVENOUS | Status: DC | PRN

## 2018-04-15 MED ORDER — FENTANYL 40 MCG/ML IV SOLN
INTRAVENOUS | Status: DC
  Administered 2018-04-15: 12:00:00 via INTRAVENOUS
  Administered 2018-04-15: 10 ug via INTRAVENOUS
  Administered 2018-04-16: 0 ug via INTRAVENOUS
  Administered 2018-04-16: 20 ug via INTRAVENOUS
  Administered 2018-04-17: 10 ug via INTRAVENOUS
  Administered 2018-04-17: 70 ug via INTRAVENOUS
  Administered 2018-04-17: 40 ug via INTRAVENOUS
  Administered 2018-04-18: 30 ug via INTRAVENOUS
  Administered 2018-04-18 (×2): 50 ug via INTRAVENOUS
  Administered 2018-04-18: 20 ug via INTRAVENOUS
  Administered 2018-04-18: 10 ug via INTRAVENOUS
  Administered 2018-04-18: 20 ug via INTRAVENOUS
  Administered 2018-04-19: 10 ug via INTRAVENOUS
  Filled 2018-04-15: qty 1000

## 2018-04-15 MED ORDER — 0.9 % SODIUM CHLORIDE (POUR BTL) OPTIME
TOPICAL | Status: DC | PRN
  Administered 2018-04-15: 3000 mL

## 2018-04-15 SURGICAL SUPPLY — 90 items
APPLIER CLIP ROT 10 11.4 M/L (STAPLE)
CANISTER SUCT 3000ML PPV (MISCELLANEOUS) ×3 IMPLANT
CATH THORACIC 28FR (CATHETERS) ×3 IMPLANT
CATH THORACIC 28FR RT ANG (CATHETERS) IMPLANT
CATH THORACIC 36FR (CATHETERS) IMPLANT
CATH THORACIC 36FR RT ANG (CATHETERS) IMPLANT
CLEANER TIP ELECTROSURG 2X2 (MISCELLANEOUS) ×3 IMPLANT
CLIP APPLIE ROT 10 11.4 M/L (STAPLE) IMPLANT
CLIP VESOCCLUDE MED 6/CT (CLIP) IMPLANT
CONN ST 1/4X3/8  BEN (MISCELLANEOUS)
CONN ST 1/4X3/8 BEN (MISCELLANEOUS) IMPLANT
CONN Y 3/8X3/8X3/8  BEN (MISCELLANEOUS)
CONN Y 3/8X3/8X3/8 BEN (MISCELLANEOUS) IMPLANT
CONT SPEC 4OZ CLIKSEAL STRL BL (MISCELLANEOUS) ×42 IMPLANT
COVER SURGICAL LIGHT HANDLE (MISCELLANEOUS) IMPLANT
COVER WAND RF STERILE (DRAPES) IMPLANT
CUTTER ECHEON FLEX ENDO 45 340 (ENDOMECHANICALS) ×3 IMPLANT
DERMABOND ADVANCED (GAUZE/BANDAGES/DRESSINGS) ×4
DERMABOND ADVANCED .7 DNX12 (GAUZE/BANDAGES/DRESSINGS) ×2 IMPLANT
DRAIN CHANNEL 28F RND 3/8 FF (WOUND CARE) IMPLANT
DRAIN CHANNEL 32F RND 10.7 FF (WOUND CARE) IMPLANT
DRAPE CV SPLIT W-CLR ANES SCRN (DRAPES) ×3 IMPLANT
DRAPE ORTHO SPLIT 77X108 STRL (DRAPES) ×2
DRAPE SURG ORHT 6 SPLT 77X108 (DRAPES) ×1 IMPLANT
DRAPE WARM FLUID 44X44 (DRAPE) ×3 IMPLANT
ELECT BLADE 6.5 EXT (BLADE) ×3 IMPLANT
ELECT REM PT RETURN 9FT ADLT (ELECTROSURGICAL) ×3
ELECTRODE REM PT RTRN 9FT ADLT (ELECTROSURGICAL) ×1 IMPLANT
GAUZE SPONGE 4X4 12PLY STRL (GAUZE/BANDAGES/DRESSINGS) ×3 IMPLANT
GAUZE SPONGE 4X4 12PLY STRL LF (GAUZE/BANDAGES/DRESSINGS) ×3 IMPLANT
GLOVE BIO SURGEON STRL SZ 6.5 (GLOVE) ×8 IMPLANT
GLOVE BIO SURGEONS STRL SZ 6.5 (GLOVE) ×4
GLOVE BIOGEL PI IND STRL 6.5 (GLOVE) ×4 IMPLANT
GLOVE BIOGEL PI INDICATOR 6.5 (GLOVE) ×8
GLOVE SURG SIGNA 7.5 PF LTX (GLOVE) ×6 IMPLANT
GOWN STRL REUS W/ TWL LRG LVL3 (GOWN DISPOSABLE) ×3 IMPLANT
GOWN STRL REUS W/ TWL XL LVL3 (GOWN DISPOSABLE) ×1 IMPLANT
GOWN STRL REUS W/TWL LRG LVL3 (GOWN DISPOSABLE) ×6
GOWN STRL REUS W/TWL XL LVL3 (GOWN DISPOSABLE) ×2
HEMOSTAT SURGICEL 2X14 (HEMOSTASIS) IMPLANT
KIT BASIN OR (CUSTOM PROCEDURE TRAY) ×3 IMPLANT
KIT SUCTION CATH 14FR (SUCTIONS) IMPLANT
KIT TURNOVER KIT B (KITS) ×3 IMPLANT
NEEDLE HYPO 25GX1X1/2 BEV (NEEDLE) ×3 IMPLANT
NEEDLE SPNL 18GX3.5 QUINCKE PK (NEEDLE) ×3 IMPLANT
NS IRRIG 1000ML POUR BTL (IV SOLUTION) ×9 IMPLANT
PACK CHEST (CUSTOM PROCEDURE TRAY) ×3 IMPLANT
PAD ARMBOARD 7.5X6 YLW CONV (MISCELLANEOUS) ×9 IMPLANT
POUCH ENDO CATCH II 15MM (MISCELLANEOUS) ×3 IMPLANT
POUCH SPECIMEN RETRIEVAL 10MM (ENDOMECHANICALS) IMPLANT
SCISSORS ENDO CVD 5DCS (MISCELLANEOUS) IMPLANT
SEALANT PROGEL (MISCELLANEOUS) ×3 IMPLANT
SEALANT SURG COSEAL 4ML (VASCULAR PRODUCTS) IMPLANT
SEALANT SURG COSEAL 8ML (VASCULAR PRODUCTS) IMPLANT
SHEARS HARMONIC HDI 20CM (ELECTROSURGICAL) ×3 IMPLANT
SOLUTION ANTI FOG 6CC (MISCELLANEOUS) ×3 IMPLANT
SPECIMEN JAR MEDIUM (MISCELLANEOUS) IMPLANT
SPONGE INTESTINAL PEANUT (DISPOSABLE) IMPLANT
SPONGE TONSIL TAPE 1 RFD (DISPOSABLE) ×3 IMPLANT
STAPLE RELOAD 2.5MM WHITE (STAPLE) ×12 IMPLANT
STAPLE RELOAD 45 GRN (STAPLE) ×1 IMPLANT
STAPLE RELOAD 45MM GOLD (STAPLE) ×21 IMPLANT
STAPLE RELOAD 45MM GREEN (STAPLE) ×2
STAPLER VASCULAR ECHELON 35 (CUTTER) ×3 IMPLANT
SUT PROLENE 4 0 RB 1 (SUTURE)
SUT PROLENE 4-0 RB1 .5 CRCL 36 (SUTURE) IMPLANT
SUT SILK  1 MH (SUTURE) ×2
SUT SILK 1 MH (SUTURE) ×1 IMPLANT
SUT SILK 1 TIES 10X30 (SUTURE) IMPLANT
SUT SILK 2 0 SH (SUTURE) IMPLANT
SUT SILK 2 0SH CR/8 30 (SUTURE) IMPLANT
SUT SILK 3 0 SH 30 (SUTURE) IMPLANT
SUT SILK 3 0SH CR/8 30 (SUTURE) IMPLANT
SUT VIC AB 1 CTX 36 (SUTURE) ×2
SUT VIC AB 1 CTX36XBRD ANBCTR (SUTURE) ×1 IMPLANT
SUT VIC AB 2-0 CTX 36 (SUTURE) ×3 IMPLANT
SUT VIC AB 3-0 MH 27 (SUTURE) IMPLANT
SUT VIC AB 3-0 X1 27 (SUTURE) ×3 IMPLANT
SUT VICRYL 2 TP 1 (SUTURE) IMPLANT
SYR 10ML LL (SYRINGE) ×3 IMPLANT
SYR 30ML LL (SYRINGE) ×3 IMPLANT
SYSTEM SAHARA CHEST DRAIN ATS (WOUND CARE) ×3 IMPLANT
TAPE CLOTH 4X10 WHT NS (GAUZE/BANDAGES/DRESSINGS) ×3 IMPLANT
TAPE CLOTH SURG 4X10 WHT LF (GAUZE/BANDAGES/DRESSINGS) ×3 IMPLANT
TIP APPLICATOR SPRAY EXTEND 16 (VASCULAR PRODUCTS) ×3 IMPLANT
TOWEL GREEN STERILE (TOWEL DISPOSABLE) ×6 IMPLANT
TOWEL GREEN STERILE FF (TOWEL DISPOSABLE) IMPLANT
TRAY FOLEY MTR SLVR 16FR STAT (SET/KITS/TRAYS/PACK) ×3 IMPLANT
TROCAR XCEL BLADELESS 5X75MML (TROCAR) ×3 IMPLANT
WATER STERILE IRR 1000ML POUR (IV SOLUTION) ×6 IMPLANT

## 2018-04-15 NOTE — Interval H&P Note (Signed)
History and Physical Interval Note:  04/15/2018 7:55 AM  Marissa Turner  has presented today for surgery, with the diagnosis of LLL MASS LUL NODULE.  The various methods of treatment have been discussed with the patient and family. After consideration of risks, benefits and other options for treatment, the patient has consented to  Procedure(s): VIDEO ASSISTED THORACOSCOPY (VATS)/LEFT LOWER LOBECTOMY (Left) LEFT UPPER WEDGE RESECTION (Left) as a surgical intervention.  The patient's history has been reviewed, patient examined, no change in status, stable for surgery.  I have reviewed the patient's chart and labs.  Questions were answered to the patient's satisfaction.     Melrose Nakayama

## 2018-04-15 NOTE — Op Note (Signed)
NAME: Marissa Turner, Marissa Turner MEDICAL RECORD OZ:[id number] ACCOUNT [id number] DATE OF BIRTH:09/08/1940 FACILITY: [facility] LOCATION: [facility] PHYSICIAN:Vona Whiters Chaya Jan, MD  OPERATIVE REPORT  DATE OF PROCEDURE:  04/15/2018  PREOPERATIVE DIAGNOSIS:   1.Left lower lobe mass and  2.Left upper lobe lung nodule.  POSTOPERATIVE DIAGNOSIS:   1.Nonsmall-cell carcinoma, left lower lobe, clinical stage 1B and  2.Left upper lobe nodule.  PROCEDURE:   Left video-assisted thoracoscopy, Thoracoscopic left lower lobectomy, Wedge resection left upper lobe nodule, Lymph node dissection and Intercostal nerve block.  SURGEON:  Modesto Charon, MD  ASSISTANT:  Ellwood Handler, PA-C  ANESTHESIA:  General.  FINDINGS:  3 cm mass in the central portion of the left lower lobe.  Frozen section revealed nonsmall-cell carcinoma.  Bronchial margin negative for tumor.  Adhesions of upper lobe to the mediastinum anteriorly.  1 cm nodule in left upper lobe.  CLINICAL NOTE:  Marissa Turner is a 78 year old woman with a history of tobacco abuse and large cell lymphoma.  She recently was being evaluated for a hip replacement due to severe hip pain.  A chest x-ray was abnormal, and she had a CT of the chest which  showed a 3.3 cm lobulated mass in the left lower lobe.  This was new from a previous CT.  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  On PET CT, the left lower lobe mass was hypermetabolic with an SUV of 10.  She had a 13  mm right paratracheal node that was unchanged from a PET CT in 2016.  She was offered surgical resection.  The indications, risks, benefits, and alternatives were discussed in detail with the patient.  She understood and accepted the risks and agreed to  proceed.  OPERATIVE NOTE:  The patient was brought to the preoperative holding area on 04/15/2018.  Anesthesia placed a central venous catheter and an arterial blood pressure monitoring line.  She was taken to the  operating room, anesthetized and intubated with a  double-lumen endotracheal tube.  Intravenous antibiotics were administered.  A Foley catheter was placed.  Sequential compression devices were placed on the calves for DVT prophylaxis.  She was placed in a right lateral decubitus position, and the left  chest was prepped and draped in the usual sterile fashion.  Single-lung ventilation of the right lung was initiated and was tolerated well throughout the procedure.  A timeout was performed to confirm correct patient, site and procedure.  A solution containing 20 mL of liposomal bupivacaine, 30 mL of 0.5% bupivacaine, and 50 mL of saline was prepared.  This was used for local anesthesia at the incision sites as well as for the  intercostal nerve blocks.  The incision sites were injected before making the incisions.  An incision was made in the 7th interspace in the midaxillary line.  A 5 mm port was inserted.  The thoracoscope was advanced into the chest.  There was no pleural effusion and no abnormality of the parietal pleura.  The lung was relatively slow to  deflate.  There was no cross ventilation, and it ultimately deflated well.  A 5 cm working incision was made in the 4th interspace anterolaterally.  No rib spreading was performed during the procedure.  The inferior pulmonary ligament was divided with  the Harmonic scalpel, and the pleural reflection was divided at the hilum posteriorly and then anteriorly up to the confluence of the pulmonary veins.  There were significant adhesions of the upper lobe to the  anterior mediastinum.  These were  not taken down.  The fissure between the lingula and the lower lobe was divided with the Echelon 45 mm stapler using a gold cartridge.  All lymph nodes that were encountered during the dissection were removed and sent as separate specimens for permanent  pathology.  All the lymph nodes were relatively normal appearing.  The fissure was relatively complete, and  once the pleura was divided between the upper and lower lobes anteriorly, there was a relatively thin layer of pleura overlying the pulmonary  artery.  This was divided. Posteriorly, there was an area of the fissure that was incomplete, and this was completed with the Echelon stapler, again using a gold cartridge.  The inferior pulmonary vein was dissected out.  It was encircled and divided  with the vascular stapler.  The branch of the pulmonary artery to the superior segment of the lower lobe trifurcated almost immediately at its origin.  These vessels were encircled and divided with the vascular stapler. An additional port incision was made anterior to the first one for division of the pulmonary artery branches.  A level 11  node was removed, and this allowed the basilar segmental artery to be encircled and divided with the vascular stapler as well.  Additional nodes were removed.  The Echelon stapler with a green cartridge then was placed across the left lower lobe bronchus at its origin and closed.  A test inflation showed good aeration of the  upper lobe.  The stapler was fired, transecting the left lower lobe bronchus.  The left lower lobe was placed into an endoscopic retrieval bag, removed and sent for frozen section of the mass and the bronchial margin.  Intercostal nerve blocks then were  performed from the 3rd to the 9th interspace.  The needle was placed via a posterior approach, and 10 mL of the bupivacaine solution was injected in a subpleural plane at each interspace.  An attempt at this point was made to take down the adhesions  of the upper lobe, but in some areas there were relatively dense adhesions to the mediastinum, and the decision was made not to take down all of these adhesions.  There was a nodule in the upper lobe in the lateral portion of the lingular segment that  corresponded with the finding on CT.  This was removed with a wedge resection and was sent for permanent pathology.   The frozen section of the left lower lobe returned showing a nonsmall-cell carcinoma.  The bronchial margins were negative for tumor.  The chest was copiously  irrigated with warm saline.  A test inflation to 30 cm of water showed no air leaks from the bronchial stump or the staple lines.  A 28-French chest tube was placed through the anterior port incision.  The more posterior incision was closed with a 3-0  Vicryl suture.  The working incision was closed in 3 layers.  Dermabond was applied.  The chest tube was placed to suction.  The patient was placed back in supine position.  She was extubated in the operating room and taken to the postanesthetic care  unit in good condition.  LN/NUANCE  D:04/15/2018 T:04/15/2018 JOB:[id number]

## 2018-04-15 NOTE — Anesthesia Postprocedure Evaluation (Signed)
Anesthesia Post Note  Patient: Marissa Turner  Procedure(s) Performed: VIDEO ASSISTED THORACOSCOPY (VATS)/LEFT LOWER LOBECTOMY (Left Chest) LEFT UPPER WEDGE RESECTION (Left Chest)     Patient location during evaluation: PACU Anesthesia Type: General Level of consciousness: sedated Pain management: pain level controlled Vital Signs Assessment: post-procedure vital signs reviewed and stable Respiratory status: spontaneous breathing and respiratory function stable Cardiovascular status: stable Postop Assessment: no apparent nausea or vomiting Anesthetic complications: no    Last Vitals:  Vitals:   04/15/18 1220 04/15/18 1230  BP:  97/64  Pulse:  71  Resp: 15 12  Temp:  (!) 36.1 C  SpO2: 96% 94%    Last Pain:  Vitals:   04/15/18 1220  TempSrc:   PainSc: 3                  [name] DANIEL

## 2018-04-15 NOTE — Anesthesia Procedure Notes (Signed)
Procedure Name: Intubation Date/Time: 04/15/2018 8:19 AM Performed by: Glynda Jaeger, CRNA Pre-anesthesia Checklist: Patient identified, Patient being monitored, Timeout performed, Emergency Drugs available and Suction available Patient Re-evaluated:Patient Re-evaluated prior to induction Oxygen Delivery Method: Circle System Utilized Preoxygenation: Pre-oxygenation with 100% oxygen Induction Type: IV induction Laryngoscope Size: Mac and 3 Grade View: Grade I Tube type: Oral Endobronchial tube: Double lumen EBT and Left and 37 Fr Number of attempts: 1 Airway Equipment and Method: Stylet Placement Confirmation: ETT inserted through vocal cords under direct vision,  positive ETCO2 and breath sounds checked- equal and bilateral Secured at: 27 cm Tube secured with: Tape Dental Injury: Teeth and Oropharynx as per pre-operative assessment

## 2018-04-15 NOTE — Brief Op Note (Addendum)
04/15/2018  10:43 AM  PATIENT:  Arlyce Dice  78 y.o. female  PRE-OPERATIVE DIAGNOSIS:  LLL MASS LUL NODULE  POST-OPERATIVE DIAGNOSIS:  LLL MASS LUL NODULE  PROCEDURE:  Procedure(s):  VIDEO ASSISTED THORACOSCOPY  -Left Lower Lobectomy -Wedge Resection LUL -Lymph Node Dissection -Intercostal Nerve Block  SURGEON:  Surgeon(s) and Role:    * Melrose Nakayama, MD - Primary  PHYSICIAN ASSISTANT: Ellwood Handler PA-C  ANESTHESIA:   general  EBL:  100 mL   BLOOD ADMINISTERED:none  DRAINS: 28 Straight Chest Tube   LOCAL MEDICATIONS USED:  MARCAINE    and BUPIVICAINE   SPECIMEN:  Source of Specimen:  Left Lower Lobe, Wedge Left Upper Lobe, Lymph nodes  DISPOSITION OF SPECIMEN:  PATHOLOGY  COUNTS:  YES  TOURNIQUET:  * No tourniquets in log *  DICTATION: .Dragon Dictation  PLAN OF CARE: Admit to inpatient   PATIENT DISPOSITION:  PACU - hemodynamically stable.   Delay start of Pharmacological VTE agent (>24hrs) due to surgical blood loss or risk of bleeding: no

## 2018-04-15 NOTE — Transfer of Care (Signed)
Immediate Anesthesia Transfer of Care Note  Patient: Marissa Turner  Procedure(s) Performed: VIDEO ASSISTED THORACOSCOPY (VATS)/LEFT LOWER LOBECTOMY (Left Chest) LEFT UPPER WEDGE RESECTION (Left Chest)  Patient Location: PACU  Anesthesia Type:General  Level of Consciousness: drowsy and responds to stimulation  Airway & Oxygen Therapy: Patient Spontanous Breathing and Patient connected to face mask oxygen  Post-op Assessment: Report given to RN and Post -op Vital signs reviewed and stable  Post vital signs: Reviewed and stable  Last Vitals:  Vitals Value Taken Time  BP 132/86 04/15/2018 10:59 AM  Temp    Pulse 80 04/15/2018 11:02 AM  Resp 13 04/15/2018 11:02 AM  SpO2 100 % 04/15/2018 11:02 AM  Vitals shown include unvalidated device data.  Last Pain:  Vitals:   04/15/18 0631  TempSrc:   PainSc: 0-No pain      Patients Stated Pain Goal: 4 ([id number])  Complications: No apparent anesthesia complications

## 2018-04-15 NOTE — Progress Notes (Signed)
Transferred-in from  PACU by bed awake and alert.

## 2018-04-15 NOTE — Anesthesia Procedure Notes (Signed)
Central Venous Catheter Insertion Performed by: Lillia Abed, MD, anesthesiologist Start/End3/19/2020 6:55 AM, 04/15/2018 7:05 AM Patient location: Pre-op. Preanesthetic checklist: patient identified, IV checked, risks and benefits discussed, surgical consent, monitors and equipment checked, pre-op evaluation, timeout performed and anesthesia consent Position: Trendelenburg Lidocaine 1% used for infiltration and patient sedated Hand hygiene performed , maximum sterile barriers used  and Seldinger technique used Catheter size: 8 Fr Total catheter length 16. Central line was placed.Double lumen Procedure performed using ultrasound guided technique. Ultrasound Notes:anatomy identified, needle tip was noted to be adjacent to the nerve/plexus identified, no ultrasound evidence of intravascular and/or intraneural injection and image(s) printed for medical record Attempts: 1 Following insertion, dressing applied, line sutured and Biopatch. Post procedure assessment: blood return through all ports, free fluid flow and no air  Patient tolerated the procedure well with no immediate complications.

## 2018-04-15 NOTE — Anesthesia Procedure Notes (Signed)
Arterial Line Insertion Start/End3/19/2020 7:00 AM, 04/15/2018 7:05 AM Performed by: Glynda Jaeger, CRNA, CRNA  Patient location: Pre-op. Preanesthetic checklist: patient identified, IV checked, site marked, risks and benefits discussed, surgical consent, monitors and equipment checked, pre-op evaluation, timeout performed and anesthesia consent Patient sedated Left, radial was placed Catheter size: 20 G Hand hygiene performed  and maximum sterile barriers used  Allen's test indicative of satisfactory collateral circulation Attempts: 1 Procedure performed without using ultrasound guided technique. Following insertion, dressing applied. Post procedure assessment: normal  Patient tolerated the procedure well with no immediate complications.

## 2018-04-16 ENCOUNTER — Encounter (HOSPITAL_COMMUNITY): Payer: Self-pay | Admitting: Thoracic Surgery (Cardiothoracic Vascular Surgery)

## 2018-04-16 ENCOUNTER — Inpatient Hospital Stay (HOSPITAL_COMMUNITY): Payer: Medicare Other

## 2018-04-16 LAB — CBC
HCT: 33.3 % — ABNORMAL LOW (ref 36.0–46.0)
Hemoglobin: 10.4 g/dL — ABNORMAL LOW (ref 12.0–15.0)
MCH: 28.1 pg (ref 26.0–34.0)
MCHC: 31.2 g/dL (ref 30.0–36.0)
MCV: 90 fL (ref 80.0–100.0)
NRBC: 0 % (ref 0.0–0.2)
Platelets: 240 10*3/uL (ref 150–400)
RBC: 3.7 MIL/uL — ABNORMAL LOW (ref 3.87–5.11)
RDW: 15.4 % (ref 11.5–15.5)
WBC: 13.9 10*3/uL — ABNORMAL HIGH (ref 4.0–10.5)

## 2018-04-16 LAB — BLOOD GAS, ARTERIAL
Acid-Base Excess: 1.7 mmol/L (ref 0.0–2.0)
Bicarbonate: 26.3 mmol/L (ref 20.0–28.0)
FIO2: 0.28
O2 SAT: 98 %
Patient temperature: 98.6
pCO2 arterial: 45.6 mmHg (ref 32.0–48.0)
pH, Arterial: 7.379 (ref 7.350–7.450)
pO2, Arterial: 137 mmHg — ABNORMAL HIGH (ref 83.0–108.0)

## 2018-04-16 LAB — BASIC METABOLIC PANEL
Anion gap: 3 — ABNORMAL LOW (ref 5–15)
BUN: 8 mg/dL (ref 8–23)
CALCIUM: 8.2 mg/dL — AB (ref 8.9–10.3)
CO2: 30 mmol/L (ref 22–32)
CREATININE: 0.63 mg/dL (ref 0.44–1.00)
Chloride: 104 mmol/L (ref 98–111)
GFR calc Af Amer: >60 mL/min (ref >60)
GFR calc non Af Amer: >60 mL/min (ref >60)
Glucose, Bld: 158 mg/dL — ABNORMAL HIGH (ref 70–99)
Potassium: 3.9 mmol/L (ref 3.5–5.1)
Sodium: 137 mmol/L (ref 135–145)

## 2018-04-16 MED ORDER — GUAIFENESIN ER 600 MG PO TB12
1200.0000 mg | ORAL_TABLET | Freq: Two times a day (BID) | ORAL | Status: DC
  Administered 2018-04-16 – 2018-04-20 (×9): 1200 mg via ORAL
  Filled 2018-04-16 (×9): qty 2

## 2018-04-16 MED ORDER — WARFARIN - PHYSICIAN DOSING INPATIENT
Freq: Every day | Status: DC
  Administered 2018-04-16 – 2018-04-19 (×2)

## 2018-04-16 MED ORDER — WARFARIN SODIUM 5 MG PO TABS
5.0000 mg | ORAL_TABLET | ORAL | Status: DC
  Administered 2018-04-17 – 2018-04-18 (×2): 5 mg via ORAL
  Filled 2018-04-16 (×2): qty 1

## 2018-04-16 MED ORDER — WARFARIN SODIUM 2.5 MG PO TABS: 2.5000 mg | ORAL_TABLET | ORAL | Status: DC

## 2018-04-16 MED ORDER — ORAL CARE MOUTH RINSE
15.0000 mL | Freq: Two times a day (BID) | OROMUCOSAL | Status: DC
  Administered 2018-04-16 – 2018-04-19 (×7): 15 mL via OROMUCOSAL

## 2018-04-16 NOTE — Progress Notes (Signed)
Ambulated along the hallway with walker, about 150 feet, noted heart rate went up to 140's a- fib,pt claimed that her heart is pumping fast, had her cardizem po 1 hour ago, assisted to chair, heart rate went down to 110-120. Claimed she feels better.continue to monitor.

## 2018-04-16 NOTE — Discharge Summary (Addendum)
Physician Discharge Summary  Patient ID: Marissa Turner MRN: [id number] DOB/AGE: 1940-10-23 78 y.o.  Admit date: 04/15/2018 Discharge date: 04/20/2018  Admission Diagnoses:  Patient Active Problem List   Diagnosis Date Noted  . Positive colorectal cancer screening using Cologuard test 03/22/2018  . Depression 07/25/2015  . Knee pain, right 03/22/2015  . Iron deficiency 10/17/2013  . 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 Encounter for therapeutic drug monitoring 02/24/2013  . DJD (degenerative joint disease), lumbosacral   . Atrial fibrillation, chronic   . Chronic anticoagulation   . Tobacco abuse   . Peptic ulcer disease   . Hypertension   . 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 Squamous cell lung cancer, left ([place]) 04/20/2018  . 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 S/P Left Lower Lobectomy, Wedge Resection LUL 04/15/2018  . Positive colorectal cancer screening using Cologuard test 03/22/2018  . Depression 07/25/2015  . Knee pain, right 03/22/2015  . Iron deficiency 10/17/2013  . 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 Encounter for therapeutic drug monitoring 02/24/2013  . DJD (degenerative joint disease), lumbosacral   . Atrial fibrillation, chronic   . Chronic anticoagulation   . Tobacco abuse   . Peptic ulcer disease   . Hypertension   . SPINAL STENOSIS 02/03/2008   Discharged Condition: good  History of Present Illness:  Marissa Turner is a 78 yo white female with history of tobacco abuse, large cell lymphoma treated in 2015, HTN, Chronic A. Fib on coumadin, DJD, and H/O Bill[place]th procedure for Ulcer.  The patient was undergoing evaluation for a hip replacement due to severe hip pain.  CT of the chest was obtained and showed a 3.3 cm lobulated mass in the left lower lobe which was new.  She was also noted to have a stable 1 cm left  upper lobe mass felt to be a hamartoma.  PET CT scan showed a left lower lobe mass with hypermetabolic with an SUV of 10.  The left upper lobe nodule was not active.  MRI of the brain was negative for metastatic disease.  It was felt the patient should undergo surgical resection.  The risks and benefits of the procedure were explained to the patient and she was agreeable to proceed.  Hospital Course:   Marissa Turner presented to [facility] on 04/15/2018.  She was taken to the operating room and underwent Left VATS, wedge resection LUL, Left Lower Lobectomy, Lymph Node Dissection, and intercostal nerve block.  The patient tolerated the procedure without difficulty, was extubated and taken to the PACU in stable condition.  Her chest tube showed evidence of an air leak.  Her CXR showed minimal left apical pneumothorax.  Her chest tube was transitioned to water seal on 04/17/2018.  She has Atrial Fibrillation chronically.  She developed elevated rate with AVR.  She was treated with IV Amiodarone which improved her rate.  This was ultimately discontinued prior to discharge.  She was kept on her home regimen of Cardizem.  Her coumadin was restarted per her home regimen.  Her most recent INR is 1.4.  She was started on Z-pack for bronchitis with due to thickened sputum production.  Her chest tube was free of air leak and removed on 04/19/2018.  Follow up CXR showed stable left apical pneumothorax, LLL opacity which is chronic and left pleural effusion.  She is  ambulating independently.  Her incisions are healing without evidence of infection.  She is medically stable for discharge home today.      Significant Diagnostic Studies: nuclear medicine:   1. The 3.3 cm left lower lobe mass has a maximum SUV of 10.2, favoring bronchogenic carcinoma. No definite hypermetabolic adenopathy. A lower paratracheal node anterior to the carina is mildly prominent and with an activity similar to blood pool, but had a similar  size and activity back on 02/06/2014. 2. A chronic left upper lobe pulmonary nodule is not hypermetabolic and has been reported back through 2004, and is considered benign. 3. Other imaging findings of potential clinical significance: Acute on chronic bilateral maxillary sinusitis with chronic ethmoid sinusitis. Aortic Atherosclerosis (ICD10-I70.0) and Emphysema (ICD10-J43.9). Coronary atherosclerosis. Cardiomegaly. Hepatic steatosis. Postoperative findings in the left upper quadrant. Photopenic renal cysts. Photopenic left ovarian cyst. Severe degenerative arthropathy left hip.  Pathology:  1. Lung, resection (segmental or lobe), Left lower lobe - SQUAMOUS CELL CARCINOMA, MODERATE TO POORLY DIFFERENTIATED, 4.0 CM - MARGINS UNINVOLVED BY CARCINOMA - SEE ONCOLOGY TABLE AND COMMENT BELOW 2. Lymph node, biopsy, Level 9 - NO CARCINOMA IDENTIFIED IN ONE LYMPH NODE (0/1) 3. Lymph node, biopsy, Level 8 - NO CARCINOMA IDENTIFIED IN ONE LYMPH NODE (0/1) 4. Lymph node, biopsy, Level 11 - NO CARCINOMA IDENTIFIED IN NODAL TISSUE 5. Lymph node, biopsy, Level 11 #2 - NO CARCINOMA IDENTIFIED IN NODAL TISSUE 6. Lymph node, biopsy, Level 9 #2 - NO CARCINOMA IDENTIFIED IN NODAL TISSUE 7. Lymph node, biopsy, Level 10 - NO CARCINOMA IDENTIFIED IN NODAL TISSUE 8. Lymph node, biopsy, Level 12 - NO CARCINOMA IDENTIFIED IN NODAL TISSUE 9. Lymph node, biopsy, Level 7 - NO CARCINOMA IDENTIFIED IN ONE LYMPH NODE (0/1) 10. Lymph node, biopsy, Level 12 #2 - NO CARCINOMA IDENTIFIED IN NODAL TISSUE 11. Lymph node, biopsy, Level 13 - NO CARCINOMA IDENTIFIED IN ONE LYMPH NODE (0/1) 12. Lymph node, biopsy, Level 12 #3 - NO CARCINOMA IDENTIFIED IN NODAL TISSUE 13. Lymph node, biopsy, Level 7 #2 - NO CARCINOMA IDENTIFIED IN ONE LYMPH NODE (0/1) 14. Lymph node, biopsy, Level 4L 1 of 4 FINAL for Howse, Bathsheba W ([id number]) Diagnosis(continued) - NO CARCINOMA IDENTIFIED IN ONE LYMPH NODE (0/1) 15. Lung,  wedge biopsy/resection, Left upper - BENIGN HAMARTOMA  Treatments:   Left video-assisted thoracoscopy, thoracoscopic left lower lobectomy, wedge resection left upper lobe nodule, lymph node dissection and intercostal nerve block.  Discharge Exam: Blood pressure 110/67, pulse 88, temperature (!) 97.5 F (36.4 C), temperature source Axillary, resp. rate (!) 22, height 5' (1.524 m), weight 52.2 kg, SpO2 90 %.  General appearance: alert, cooperative and no distress Heart: irregularly irregular rhythm Lungs: diminished breath sounds LLL Abdomen: soft, non-tender; bowel sounds normal; no masses,  no organomegaly Extremities: extremities normal, atraumatic, no cyanosis or edema Wound: clean and dry   Discharge disposition: 01-Home or Self Care  Discharge medications:   Allergies as of 04/20/2018      Reactions   Estrogens Swelling   edema   Codeine Swelling, Rash   Penicillins Other (See Comments)   Break out in whelps. Did it involve swelling of the face/tongue/throat, SOB, or low BP? No Did it involve sudden or severe rash/hives, skin peeling, or any reaction on the inside of your mouth or nose? No Did you need to seek medical attention at a hospital or doctor's office? No When did it last happen? Childhood allergy If all above answers are "NO", may proceed with cephalosporin use.  Diphenhydramine Hcl Other (See Comments)   hallucinations       Medication List    TAKE these medications   acetaminophen 500 MG tablet Commonly known as:  TYLENOL Take 1,000 mg by mouth every 6 (six) hours as needed for moderate pain or headache.   ALPRAZolam 1 MG tablet Commonly known as:  XANAX Take 1 tablet (1 mg total) by mouth 3 (three) times daily as needed for anxiety.   azithromycin 250 MG tablet Commonly known as:  ZITHROMAX Take 1 tablet (250 mg total) by mouth daily. Start taking on:  April 21, 2018   calcium-vitamin D 500-200 MG-UNIT tablet Commonly known as:  OSCAL  WITH D Take 1 tablet by mouth daily with breakfast.   citalopram 20 MG tablet Commonly known as:  CELEXA TAKE ONE (1) TABLET BY MOUTH EVERY DAY What changed:  See the new instructions.   diltiazem 240 MG 24 hr capsule Commonly known as:  CARDIZEM CD Take 1 capsule (240 mg total) by mouth daily.   ferrous sulfate 325 (65 FE) MG tablet Take 325 mg by mouth daily with breakfast.   nicotine 7 mg/24hr patch Commonly known as:  NICODERM CQ - dosed in mg/24 hr Place 1 patch (7 mg total) onto the skin daily.   traMADol 50 MG tablet Commonly known as:  ULTRAM Take 1-2 tablets (50-100 mg total) by mouth every 6 (six) hours as needed (mild pain).   warfarin 5 MG tablet Commonly known as:  COUMADIN Take as directed. If you are unsure how to take this medication, talk to your nurse or doctor. Original instructions:  TAKE ONE TABLELT ONCE DAILY EXCEPT TAKE 1/2 TABLET ON MONDAY, WEDNESDAY ANDFRIDAY What changed:    how much to take  how to take this  when to take this  additional instructions      Follow-up Information    Melrose Nakayama, MD Follow up on 05/04/2018.   Specialty:  Cardiothoracic Surgery Why:  Appointment is at 10:30, please get CXR at 10:00 at [facility] located on first floor of our office building Contact information: [address] [place] [address] [phone]           Signed:  Ellwood Handler PA-C 04/20/2018, 7:42 AM

## 2018-04-16 NOTE — Progress Notes (Addendum)
      [address]       [place] [address]             [phone]      1 Day Post-Op Procedure(s) (LRB): VIDEO ASSISTED THORACOSCOPY (VATS)/LEFT LOWER LOBECTOMY (Left) LEFT UPPER WEDGE RESECTION (Left)   Subjective:  Patient with multiple complaints.  She states her back hurts.  The bed is terrible and she has been unable to rest.  She needs the IV out of her left wrist as she is left handed and is not able to eat.  + cough with difficulty coughing up sputum.  Objective: Vital signs in last 24 hours: Temp:  [97 F (36.1 C)-97.9 F (36.6 C)] 97.9 F (36.6 C) (03/20 0412) Pulse Rate:  [55-92] 92 (03/20 0412) Cardiac Rhythm: Atrial fibrillation (03/20 0430) Resp:  [12-18] 18 (03/20 0430) BP: (97-132)/(64-86) 100/70 (03/20 0412) SpO2:  [94 %-100 %] 96 % (03/20 0430) Arterial Line BP: (90-144)/(49-78) 110/63 (03/19 1958) FiO2 (%):  [0 %] 0 % (03/19 1657)  Intake/Output from previous day: 03/19 0701 - 03/20 0700 In: 3159 [P.O.:100; I.V.:2809; IV Piggyback:250] Out: 1708 [Urine:1400; Blood:100; Chest Tube:208]  General appearance: alert, cooperative and no distress Heart: irregularly irregular rhythm Lungs: clear to auscultation bilaterally Abdomen: soft, non-tender; bowel sounds normal; no masses,  no organomegaly Extremities: extremities normal, atraumatic, no cyanosis or edema Wound: clean and dry  Lab Results: Recent Labs    04/13/18 1344 04/16/18 0517  WBC 9.9 13.9*  HGB 12.6 10.4*  HCT 39.1 33.3*  PLT 282 240   BMET:  Recent Labs    04/13/18 1344 04/16/18 0517  NA 138 137  K 3.9 3.9  CL 105 104  CO2 26 30  GLUCOSE 87 158*  BUN 12 8  CREATININE 0.59 0.63  CALCIUM 8.7* 8.2*    PT/INR:  Recent Labs    04/13/18 1344  LABPROT 16.0*  INR 1.3*   ABG    Component Value Date/Time   PHART 7.379 04/16/2018 0425   HCO3 26.3 04/16/2018 0425   O2SAT 98.0 04/16/2018 0425   CBG (last 3)  No results for input(s): GLUCAP in the last 72  hours.  Assessment/Plan: S/P Procedure(s) (LRB): VIDEO ASSISTED THORACOSCOPY (VATS)/LEFT LOWER LOBECTOMY (Left) LEFT UPPER WEDGE RESECTION (Left)  1. Chest tube- intermittent 1+ air leak at rest and with cough- chest tube on suction today 2. CV- chronic Atrial fibrillation, continue home cardizem, will restart home coumadin dosing tomorrow 3. Pulm- wean oxygen as tolerated, continued use of IS, started Mucinex to help break down secretions, CXR is without pneumothorax 4. D/C Arterial Line 5. Fluids to [facility] 6. Lovenox for DVT prophylaxis, can d/c once INR is therapeutic 7. Dispo- patient stable, intermittent 1+ air leak at rest and with cough, chest tube on suction, restart home coumadin tomorrow for chronic Atrial Fibrillation, repeat CXR in AM    LOS: 1 day    Ellwood Handler 04/16/2018 Patient seen and examined, agree with above Happier with A line out Looks good A fib controlled rate- restart coumadin tomorrow  Remo Lipps C. Roxan Hockey, MD [facility] [phone]

## 2018-04-16 NOTE — Progress Notes (Signed)
Encouraged to walk along the hallway but refused due to pain on the incision site.

## 2018-04-17 ENCOUNTER — Inpatient Hospital Stay (HOSPITAL_COMMUNITY): Payer: Medicare Other

## 2018-04-17 LAB — CBC
HCT: 33.4 % — ABNORMAL LOW (ref 36.0–46.0)
Hemoglobin: 10.4 g/dL — ABNORMAL LOW (ref 12.0–15.0)
MCH: 28.1 pg (ref 26.0–34.0)
MCHC: 31.1 g/dL (ref 30.0–36.0)
MCV: 90.3 fL (ref 80.0–100.0)
Platelets: 248 10*3/uL (ref 150–400)
RBC: 3.7 MIL/uL — ABNORMAL LOW (ref 3.87–5.11)
RDW: 15.6 % — ABNORMAL HIGH (ref 11.5–15.5)
WBC: 13.6 10*3/uL — ABNORMAL HIGH (ref 4.0–10.5)
nRBC: 0 % (ref 0.0–0.2)

## 2018-04-17 LAB — COMPREHENSIVE METABOLIC PANEL
ALT: 190 U/L — AB (ref 0–44)
AST: 195 U/L — ABNORMAL HIGH (ref 15–41)
Albumin: 3 g/dL — ABNORMAL LOW (ref 3.5–5.0)
Alkaline Phosphatase: 105 U/L (ref 38–126)
Anion gap: 9 (ref 5–15)
BUN: 7 mg/dL — ABNORMAL LOW (ref 8–23)
CO2: 29 mmol/L (ref 22–32)
CREATININE: 0.63 mg/dL (ref 0.44–1.00)
Calcium: 8.1 mg/dL — ABNORMAL LOW (ref 8.9–10.3)
Chloride: 100 mmol/L (ref 98–111)
GFR calc non Af Amer: >60 mL/min (ref >60)
Glucose, Bld: 101 mg/dL — ABNORMAL HIGH (ref 70–99)
Potassium: 3.5 mmol/L (ref 3.5–5.1)
Sodium: 138 mmol/L (ref 135–145)
Total Bilirubin: 1 mg/dL (ref 0.3–1.2)
Total Protein: 5.6 g/dL — ABNORMAL LOW (ref 6.5–8.1)

## 2018-04-17 MED ORDER — AMIODARONE HCL IN DEXTROSE 360-4.14 MG/200ML-% IV SOLN
30.0000 mg/h | INTRAVENOUS | Status: AC
  Administered 2018-04-17: 30 mg/h via INTRAVENOUS
  Filled 2018-04-17: qty 200

## 2018-04-17 MED ORDER — POTASSIUM CHLORIDE 20 MEQ PO PACK
20.0000 meq | PACK | Freq: Two times a day (BID) | ORAL | Status: DC
  Administered 2018-04-17 – 2018-04-20 (×7): 20 meq via ORAL
  Filled 2018-04-17 (×7): qty 1

## 2018-04-17 MED ORDER — AMIODARONE HCL IN DEXTROSE 360-4.14 MG/200ML-% IV SOLN
60.0000 mg/h | INTRAVENOUS | Status: DC
  Administered 2018-04-17: 60 mg/h via INTRAVENOUS
  Filled 2018-04-17: qty 200

## 2018-04-17 MED ORDER — AMIODARONE HCL 200 MG PO TABS
200.0000 mg | ORAL_TABLET | Freq: Two times a day (BID) | ORAL | Status: DC
  Administered 2018-04-17 – 2018-04-18 (×3): 200 mg via ORAL
  Filled 2018-04-17 (×3): qty 1

## 2018-04-17 MED ORDER — LEVALBUTEROL HCL 0.63 MG/3ML IN NEBU
0.6300 mg | INHALATION_SOLUTION | Freq: Three times a day (TID) | RESPIRATORY_TRACT | Status: DC
  Administered 2018-04-17 – 2018-04-20 (×10): 0.63 mg via RESPIRATORY_TRACT
  Filled 2018-04-17 (×11): qty 3

## 2018-04-17 NOTE — Progress Notes (Signed)
Patient's HR shifted from 80-100's to 110-150's. BP 107/75, O2Sat 94% on 3L. Patient denies any severe pain. Chest tube site unchanged, dressing is CDI. Cardiothoracic surgery paged. New order of Amiodarone 60 mg/hour X 6hours and then 30 mg/hour received.

## 2018-04-17 NOTE — Progress Notes (Addendum)
[address]       [place] [address]             [phone]      2 Days Post-Op Procedure(s) (LRB): VIDEO ASSISTED THORACOSCOPY (VATS)/LEFT LOWER LOBECTOMY (Left) LEFT UPPER WEDGE RESECTION (Left) Subjective: Feels ok, having rapid AFIB, CURRENTLY ON AMIO GTT  Objective: Vital signs in last 24 hours: Temp:  [97.5 F (36.4 C)-99.8 F (37.7 C)] 98.4 F (36.9 C) (03/21 0813) Pulse Rate:  [71-132] 90 (03/21 0848) Cardiac Rhythm: Atrial fibrillation (03/21 0700) Resp:  [16-26] 20 (03/21 0848) BP: (75-141)/(58-82) 75/58 (03/21 0813) SpO2:  [0 %-100 %] 100 % (03/21 0848) FiO2 (%):  [0 %] 0 % (03/20 1726)  Hemodynamic parameters for last 24 hours:    Intake/Output from previous day: 03/20 0701 - 03/21 0700 In: 638.7 [P.O.:360; I.V.:278.7] Out: 3275 [Urine:2375; Chest Tube:900] Intake/Output this shift: Total I/O In: 240 [P.O.:240] Out: 0   General appearance: alert, cooperative and no distress Heart: irregularly irregular rhythm Lungs: coarse in upper airways, improves with cough Abdomen: benign Extremities: no edema or calf tenderness Wound: incis healing well  Lab Results: Recent Labs    04/16/18 0517 04/17/18 0450  WBC 13.9* 13.6*  HGB 10.4* 10.4*  HCT 33.3* 33.4*  PLT 240 248   BMET:  Recent Labs    04/16/18 0517 04/17/18 0450  NA 137 138  K 3.9 3.5  CL 104 100  CO2 30 29  GLUCOSE 158* 101*  BUN 8 7*  CREATININE 0.63 0.63  CALCIUM 8.2* 8.1*    PT/INR: No results for input(s): LABPROT, INR in the last 72 hours. ABG    Component Value Date/Time   PHART 7.379 04/16/2018 0425   HCO3 26.3 04/16/2018 0425   O2SAT 98.0 04/16/2018 0425   CBG (last 3)  No results for input(s): GLUCAP in the last 72 hours.  Meds Scheduled Meds: . sodium chloride   Intravenous Once  . acetaminophen  1,000 mg Oral Q6H   Or  . acetaminophen (TYLENOL) oral liquid 160 mg/5 mL  1,000 mg Oral Q6H  . bisacodyl  10 mg Oral Daily  .  calcium-vitamin D  1 tablet Oral Q breakfast  . citalopram  20 mg Oral Daily  . diltiazem  240 mg Oral Daily  . enoxaparin (LOVENOX) injection  40 mg Subcutaneous Q24H  . fentaNYL   Intravenous Q4H  . ferrous sulfate  325 mg Oral Q breakfast  . guaiFENesin  1,200 mg Oral BID  . levalbuterol  0.63 mg Nebulization TID  . mouth rinse  15 mL Mouth Rinse BID  . nicotine  7 mg Transdermal Daily  . senna-docusate  1 tablet Oral QHS  . [START ON 04/19/2018] warfarin  2.5 mg Oral Q M,W,F-1800  . warfarin  5 mg Oral Q T,Th,S,Su-1800  . Warfarin - Physician Dosing Inpatient   Does not apply q1800   Continuous Infusions: . amiodarone 30 mg/hr (04/17/18 1117)  . dextrose 5 % and 0.9% NaCl 10 mL/hr at 04/16/18 0800  . potassium chloride     PRN Meds:.ALPRAZolam, diphenhydrAMINE **OR** diphenhydrAMINE, HYDROmorphone, naloxone **AND** sodium chloride flush, ondansetron (ZOFRAN) IV, potassium chloride, traMADol  Xrays Dg Chest Port 1 View  Result Date: 04/17/2018 CLINICAL DATA:  Status post left lower lobectomy 04/15/2018. EXAM: PORTABLE CHEST 1 VIEW COMPARISON:  Single-view of the chest 04/16/2018 and 04/15/2018. FINDINGS: Right IJ catheter and left chest tube remain in place. Very small left apical pneumothorax is  again seen. Airspace opacities in left mid lung and left base worsened. The lungs are emphysematous. Right lung is clear. There is cardiomegaly. IMPRESSION: No change in a tiny left apical pneumothorax with a chest tube in place. Increased airspace disease in the left mid and lower lung zones could be due to atelectasis or pneumonia. Electronically Signed   By: Inge Rise M.D.   On: 04/17/2018 08:51   Dg Chest Port 1 View  Result Date: 04/16/2018 CLINICAL DATA:  Chest tube. EXAM: PORTABLE CHEST 1 VIEW COMPARISON:  04/15/2018, 12/05/2013.  PET-CT 03/18/2018. FINDINGS: Right IJ line and left chest tube in stable position. Tiny left pneumothorax again noted. Stable cardiomegaly.  Postsurgical changes left lung again noted. Persistent left base atelectasis/infiltrate. Chronic interstitial changes noted bilaterally. No acute bony abnormality. IMPRESSION: 1. Right IJ line and left chest tube in stable position. Tiny left pneumothorax again noted. 2. Postsurgical changes left lung with persistent left base atelectasis/infiltrate. Electronically Signed   By: Marcello Moores  Register   On: 04/16/2018 07:01   Dg Chest Port 1 View  Result Date: 04/15/2018 CLINICAL DATA:  Status post lobectomy EXAM: PORTABLE CHEST 1 VIEW COMPARISON:  04/13/2010 FINDINGS: Right jugular central venous catheter with the tip projecting over the SVC. Diffuse bilateral interstitial thickening. Trace bilateral pleural effusions. Interval left partial lobectomy. Left sided chest tube directed towards the apex. Trace left pneumothorax. Stable cardiomediastinal silhouette. No acute osseous abnormality. IMPRESSION: Interval left partial lobectomy. Left-sided chest tube directed towards the apex. Trace left pneumothorax. Cardiomegaly with mild pulmonary vascular congestion. Electronically Signed   By: Kathreen Devoid   On: 04/15/2018 11:45    Assessment/Plan: S/P Procedure(s) (LRB): VIDEO ASSISTED THORACOSCOPY (VATS)/LEFT LOWER LOBECTOMY (Left) LEFT UPPER WEDGE RESECTION (Left)  1 conts amio gtt for rapid afib, rate currently in 100's, o/w hemodyn stable with good sats on 3 liters- cont nebs, pulm toilet 2 afebrile, with stable leukocytosis, atx vs infilt on CXR- good cough mechanics, mobilizing sputum ok 3 normal renal fxn 4 H/H holding steady 5 BS control ok, no meds preop, Hg A1c in epic 6.2, in 2011, non since 6 no air leak, apical pntx stable- place to H2O seal 7 routine rehab  LOS: 2 days    John Giovanni [facility] 04/17/2018 Pager [phone]\  Postop rapid atrial fibrillation has improved with heart rate 100- 110.  Will transition to oral amiodarone and continue oral Cardizem. Chest tube drainage is  significantly dropping off and without air leak-we will place chest tube to waterseal. Potentially remove chest tube tomorrow.  patient examined and medical record reviewed,agree with above note. Tharon Aquas Trigt III 04/17/2018

## 2018-04-17 NOTE — Progress Notes (Signed)
Spoke with patient's primary RN Amina who is aware of order to discontinue central line.

## 2018-04-18 ENCOUNTER — Inpatient Hospital Stay (HOSPITAL_COMMUNITY): Payer: Medicare Other

## 2018-04-18 LAB — BASIC METABOLIC PANEL
ANION GAP: 6 (ref 5–15)
BUN: 9 mg/dL (ref 8–23)
CHLORIDE: 101 mmol/L (ref 98–111)
CO2: 28 mmol/L (ref 22–32)
Calcium: 7.7 mg/dL — ABNORMAL LOW (ref 8.9–10.3)
Creatinine, Ser: 0.59 mg/dL (ref 0.44–1.00)
GFR calc Af Amer: >60 mL/min (ref >60)
GFR calc non Af Amer: >60 mL/min (ref >60)
Glucose, Bld: 95 mg/dL (ref 70–99)
Potassium: 3.6 mmol/L (ref 3.5–5.1)
Sodium: 135 mmol/L (ref 135–145)

## 2018-04-18 LAB — PROTIME-INR
INR: 1.2 (ref 0.8–1.2)
Prothrombin Time: 14.8 seconds (ref 11.4–15.2)

## 2018-04-18 MED ORDER — AZITHROMYCIN 500 MG PO TABS
500.0000 mg | ORAL_TABLET | Freq: Every day | ORAL | Status: AC
  Administered 2018-04-18: 500 mg via ORAL
  Filled 2018-04-18: qty 1

## 2018-04-18 MED ORDER — AMIODARONE HCL 200 MG PO TABS
200.0000 mg | ORAL_TABLET | Freq: Every day | ORAL | Status: DC
  Administered 2018-04-19: 200 mg via ORAL
  Filled 2018-04-18: qty 1

## 2018-04-18 MED ORDER — AZITHROMYCIN 500 MG PO TABS
250.0000 mg | ORAL_TABLET | Freq: Every day | ORAL | Status: DC
  Administered 2018-04-19 – 2018-04-20 (×2): 250 mg via ORAL
  Filled 2018-04-18 (×2): qty 1

## 2018-04-18 MED ORDER — DILTIAZEM HCL ER COATED BEADS 180 MG PO CP24
180.0000 mg | ORAL_CAPSULE | Freq: Every day | ORAL | Status: DC
  Administered 2018-04-19 – 2018-04-20 (×2): 180 mg via ORAL
  Filled 2018-04-18 (×2): qty 1

## 2018-04-18 MED ORDER — LEVOFLOXACIN 500 MG PO TABS: 500.0000 mg | ORAL_TABLET | Freq: Every day | ORAL | Status: DC

## 2018-04-18 NOTE — Progress Notes (Addendum)
[address]       [place] [address]             [phone]      3 Days Post-Op Procedure(s) (LRB): VIDEO ASSISTED THORACOSCOPY (VATS)/LEFT LOWER LOBECTOMY (Left) LEFT UPPER WEDGE RESECTION (Left) Subjective: Looks and feels better this morning afib rate is better controlled   Objective: Vital signs in last 24 hours: Temp:  [97.6 F (36.4 C)-98.8 F (37.1 C)] 98.1 F (36.7 C) (03/22 0809) Pulse Rate:  [82-106] 93 (03/22 0435) Cardiac Rhythm: Atrial fibrillation (03/22 0809) Resp:  [14-26] 19 (03/22 0435) BP: (87-121)/(51-71) 104/65 (03/22 0809) SpO2:  [91 %-98 %] 91 % (03/22 0915)  Hemodynamic parameters for last 24 hours:    Intake/Output from previous day: 03/21 0701 - 03/22 0700 In: 846.3 [P.O.:600; I.V.:246.3] Out: 525 [Urine:400; Chest Tube:125] Intake/Output this shift: Total I/O In: 240 [P.O.:240] Out: 0   General appearance: alert, cooperative and no distress Heart: irregularly irregular rhythm Lungs: coarse BS Abdomen: benign Extremities: no edema or calf tenderness Wound: incis healing well  Lab Results: Recent Labs    04/16/18 0517 04/17/18 0450  WBC 13.9* 13.6*  HGB 10.4* 10.4*  HCT 33.3* 33.4*  PLT 240 248   BMET:  Recent Labs    04/17/18 0450 04/18/18 0230  NA 138 135  K 3.5 3.6  CL 100 101  CO2 29 28  GLUCOSE 101* 95  BUN 7* 9  CREATININE 0.63 0.59  CALCIUM 8.1* 7.7*    PT/INR:  Recent Labs    04/18/18 0230  LABPROT 14.8  INR 1.2   ABG    Component Value Date/Time   PHART 7.379 04/16/2018 0425   HCO3 26.3 04/16/2018 0425   O2SAT 98.0 04/16/2018 0425   CBG (last 3)  No results for input(s): GLUCAP in the last 72 hours.  Meds Scheduled Meds: . sodium chloride   Intravenous Once  . acetaminophen  1,000 mg Oral Q6H   Or  . acetaminophen (TYLENOL) oral liquid 160 mg/5 mL  1,000 mg Oral Q6H  . amiodarone  200 mg Oral BID  . bisacodyl  10 mg Oral Daily  . calcium-vitamin D  1 tablet Oral Q  breakfast  . citalopram  20 mg Oral Daily  . diltiazem  240 mg Oral Daily  . enoxaparin (LOVENOX) injection  40 mg Subcutaneous Q24H  . fentaNYL   Intravenous Q4H  . ferrous sulfate  325 mg Oral Q breakfast  . guaiFENesin  1,200 mg Oral BID  . levalbuterol  0.63 mg Nebulization TID  . mouth rinse  15 mL Mouth Rinse BID  . nicotine  7 mg Transdermal Daily  . potassium chloride  20 mEq Oral BID  . senna-docusate  1 tablet Oral QHS  . [START ON 04/19/2018] warfarin  2.5 mg Oral Q M,W,F-1800  . warfarin  5 mg Oral Q T,Th,S,Su-1800  . Warfarin - Physician Dosing Inpatient   Does not apply q1800   Continuous Infusions: . dextrose 5 % and 0.9% NaCl 10 mL/hr at 04/16/18 0800  . potassium chloride     PRN Meds:.ALPRAZolam, diphenhydrAMINE **OR** diphenhydrAMINE, HYDROmorphone, naloxone **AND** sodium chloride flush, ondansetron (ZOFRAN) IV, potassium chloride, traMADol  Xrays Dg Chest Port 1 View  Result Date: 04/18/2018 CLINICAL DATA:  Follow-up examination status post left lobectomy. EXAM: PORTABLE CHEST 1 VIEW COMPARISON:  Prior radiograph from 04/17/2018 FINDINGS: Moderate cardiomegaly, stable. Mediastinal silhouette within normal limits. Aortic atherosclerosis. Postoperative changes from prior left  lobectomy. Left-sided chest tube remains in place with tip overlying the lateral left upper lobe. Persistent left apical pneumothorax, similar to previous. Parenchymal opacity within the mid and lower left lung is relatively unchanged. Right lung remains largely clear. Osseous structures unchanged. IMPRESSION: 1. Persistent small left apical pneumothorax, similar to previous exam. Left-sided chest tube in satisfactory position. 2. Similar mid and left lower lobe opacity, which could reflect atelectasis or pneumonia. Electronically Signed   By: Jeannine Boga M.D.   On: 04/18/2018 06:31   Dg Chest Port 1 View  Result Date: 04/17/2018 CLINICAL DATA:  Status post left lower lobectomy 04/15/2018.  EXAM: PORTABLE CHEST 1 VIEW COMPARISON:  Single-view of the chest 04/16/2018 and 04/15/2018. FINDINGS: Right IJ catheter and left chest tube remain in place. Very small left apical pneumothorax is again seen. Airspace opacities in left mid lung and left base worsened. The lungs are emphysematous. Right lung is clear. There is cardiomegaly. IMPRESSION: No change in a tiny left apical pneumothorax with a chest tube in place. Increased airspace disease in the left mid and lower lung zones could be due to atelectasis or pneumonia. Electronically Signed   By: Inge Rise M.D.   On: 04/17/2018 08:51   Chest tube: + air leak, 110 cc out yesterday and 125 so far today  Assessment/Plan: S/P Procedure(s) (LRB): VIDEO ASSISTED THORACOSCOPY (VATS)/LEFT LOWER LOBECTOMY (Left) LEFT UPPER WEDGE RESECTION (Left)  1 improving 2 afib better controlled- changed to po amio, INR 1.2 on coumadin per pharmacy Replace K+, reduce cardizem a little with some low BP readings 3 CXR is pretty stable but with air leak, will cont chest tube at H2O seal 4 renal fxn is good 5 cont pulm toilet, wean O2 , nebs 6 increase activity as able   LOS: 3 days    John Giovanni PA-C 04/18/2018 Pager [phone]  , Air leak with cough only, chest x-ray without pneumothorax, continue waterseal to chest tube Thick yellow phlegm with productive cough, white count mildly elevated at 13.5.  Oral Levaquin started. Chronic atrial fibrillation, rate controlled with Coumadin dosing resumed.  INR 1.2.  Continuing home dose of Cardizem I am and oral amiodarone 200 mg daily for rate control.  patient examined and medical record reviewed,agree with above note. Tharon Aquas Trigt III 04/18/2018

## 2018-04-19 ENCOUNTER — Inpatient Hospital Stay (HOSPITAL_COMMUNITY): Payer: Medicare Other

## 2018-04-19 LAB — BASIC METABOLIC PANEL
Anion gap: 9 (ref 5–15)
BUN: 10 mg/dL (ref 8–23)
CO2: 28 mmol/L (ref 22–32)
Calcium: 7.9 mg/dL — ABNORMAL LOW (ref 8.9–10.3)
Chloride: 100 mmol/L (ref 98–111)
Creatinine, Ser: 0.64 mg/dL (ref 0.44–1.00)
GFR calc Af Amer: >60 mL/min (ref >60)
GFR calc non Af Amer: >60 mL/min (ref >60)
GLUCOSE: 152 mg/dL — AB (ref 70–99)
Potassium: 3.4 mmol/L — ABNORMAL LOW (ref 3.5–5.1)
Sodium: 137 mmol/L (ref 135–145)

## 2018-04-19 LAB — CBC
HCT: 29 % — ABNORMAL LOW (ref 36.0–46.0)
Hemoglobin: 9.6 g/dL — ABNORMAL LOW (ref 12.0–15.0)
MCH: 29.1 pg (ref 26.0–34.0)
MCHC: 33.1 g/dL (ref 30.0–36.0)
MCV: 87.9 fL (ref 80.0–100.0)
Platelets: 247 10*3/uL (ref 150–400)
RBC: 3.3 MIL/uL — ABNORMAL LOW (ref 3.87–5.11)
RDW: 15.4 % (ref 11.5–15.5)
WBC: 11.7 10*3/uL — ABNORMAL HIGH (ref 4.0–10.5)
nRBC: 0 % (ref 0.0–0.2)

## 2018-04-19 LAB — PROTIME-INR
INR: 1.2 (ref 0.8–1.2)
Prothrombin Time: 14.6 seconds (ref 11.4–15.2)

## 2018-04-19 MED ORDER — WARFARIN SODIUM 5 MG PO TABS
5.0000 mg | ORAL_TABLET | Freq: Every day | ORAL | Status: DC
  Administered 2018-04-19: 5 mg via ORAL
  Filled 2018-04-19: qty 1

## 2018-04-19 NOTE — Discharge Instructions (Signed)
Video-Assisted Thoracic Surgery, Care After °This sheet gives you information about how to care for yourself after your procedure. Your health care provider may also give you more specific instructions. If you have problems or questions, contact your health care provider. °What can I expect after the procedure? °After the procedure, it is common to have: °· Some pain and soreness in your chest. °· Pain when breathing in (inhaling) and coughing. °· Constipation. °· Fatigue. °· Difficulty sleeping. °Follow these instructions at home: °Preventing pneumonia °· Take deep breaths or do breathing exercises as instructed by your health care provider. Doing this helps prevent lung infection (pneumonia). °· Cough frequently. Coughing may cause discomfort, but it is important to clear mucus (phlegm) and expand your lungs. If it hurts to cough, hold a pillow against your chest or place the palms of both hands on top of the incision (use splinting) when you cough. This may help relieve discomfort. °· If you were given an incentive spirometer, use it as directed. An incentive spirometer is a tool that measures how well you are filling your lungs with each breath. °· Participate in pulmonary rehabilitation as directed by your health care provider. This is a program that combines education, exercise, and support from a team of specialists. The goal is to help you heal and get back to your normal activities as soon as possible. °Medicines °· Take over-the-counter or prescription medicines only as told by your health care provider. °· If you have pain, take pain-relieving medicine before your pain becomes severe. This is important because if your pain is under control, you will be able to breathe and cough more comfortably. °· If you were prescribed an antibiotic medicine, take it as told by your health care provider. Do not stop taking the antibiotic even if you start to feel better. °Activity °· Ask your health care provider what  activities are safe for you. °· Avoid activities that use your chest muscles for at least 3-4 weeks. °· Do not lift anything that is heavier than 10 lb (4.5 kg), or the limit that your health care provider tells you, until he or she says that it is safe. °Incision care °· Follow instructions from your health care provider about how to take care of your incision(s). Make sure you: °? Wash your hands with soap and water before you change your bandage (dressing). If soap and water are not available, use hand sanitizer. °? Change your dressing as told by your health care provider. °? Leave stitches (sutures), skin glue, or adhesive strips in place. These skin closures may need to stay in place for 2 weeks or longer. If adhesive strip edges start to loosen and curl up, you may trim the loose edges. Do not remove adhesive strips completely unless your health care provider tells you to do that. °· Keep your dressing dry until it has been removed. °· Check your incision area every day for signs of infection. Check for: °? Redness, swelling, or pain. °? Fluid or blood. °? Warmth. °? Pus or a bad smell. °Bathing °· Do not take baths, swim, or use a hot tub until your health care provider approves. You may take showers. °· After your dressing has been removed, use soap and water to gently wash your incision area. Do not use anything else to clean your incision(s) unless your health care provider tells you to do this. °Driving ° °· Do not drive until your health care provider approves. °· Do not drive or   use heavy machinery while taking prescription pain medicine. °Eating and drinking °· Eat a healthy, balanced diet as instructed by your health care provider. A healthy diet includes plenty of fresh fruits and vegetables, whole grains, and low-fat (lean) proteins. °· Limit foods that are high in fat and processed sugars, such as fried and sweet foods. °· Drink enough fluid to keep your urine clear or pale yellow. °General  instructions ° °· To prevent or treat constipation while you are taking prescription pain medicine, your health care provider may recommend that you: °? Take over-the-counter or prescription medicines. °? Eat foods that are high in fiber, such as beans, fresh fruits and vegetables, and whole grains. °· Do not use any products that contain nicotine or tobacco, such as cigarettes and e-cigarettes. If you need help quitting, ask your health care provider. °· Avoid secondhand smoke. °· Wear compression stockings as told by your health care provider. These stockings help to prevent blood clots and reduce swelling in your legs. °· If you have a chest tube, care for it as instructed by your health care provider. Do not travel by airplane during the 2 weeks after your chest tube is removed, or until your health care provider says that this is safe. °· Keep all follow-up visits as told by your health care provider. This is important. °Contact a health care provider if: °· You have redness, swelling, or pain around an incision. °· You have fluid or blood coming from an incision. °· Your incision area feels warm to the touch. °· You have pus or a bad smell coming from an incision. °· You have a fever or chills. °· You have nausea or vomiting. °· You have pain that does not get better with medicine. °Get help right away if: °· You have chest pain. °· Your heart is fluttering or beating rapidly. °· You develop a rash. °· You have shortness of breath or trouble breathing. °· You are confused. °· You have trouble speaking. °· You feel weak, light-headed, or dizzy. °· You faint. °Summary °· To help prevent lung infection (pneumonia), take deep breaths or do breathing exercises as instructed by your health care provider. °· Cough frequently to clear mucus (phlegm) and expand your lungs. If it hurts to cough, hold a pillow against your chest or place the palms of both hands on top of the incision (use splinting) when you cough. °· If  you have pain, take pain-relieving medicine before your pain becomes severe. This is important because if your pain is under control, you will be able to breathe and cough more comfortably. °· Ask your health care provider what activities are safe for you. °This information is not intended to replace advice given to you by your health care provider. Make sure you discuss any questions you have with your health care provider. °Document Released: 05/10/2012 Document Revised: 12/24/2015 Document Reviewed: 12/24/2015 °Elsevier Interactive Patient Education © 2019 [employer] ° °

## 2018-04-19 NOTE — Progress Notes (Addendum)
      [address]       [place] [address]             [phone]      4 Days Post-Op Procedure(s) (LRB): VIDEO ASSISTED THORACOSCOPY (VATS)/LEFT LOWER LOBECTOMY (Left) LEFT UPPER WEDGE RESECTION (Left)   Subjective:  No new complaints.  Happy her chest tube is being removed today.  + productive cough  + ambulation  + BM  Objective: Vital signs in last 24 hours: Temp:  [97.9 F (36.6 C)-99.5 F (37.5 C)] 97.9 F (36.6 C) (03/23 0500) Pulse Rate:  [60-98] 74 (03/23 0500) Cardiac Rhythm: Atrial fibrillation (03/23 0700) Resp:  [16-20] 16 (03/23 0500) BP: (101-139)/(63-85) 110/77 (03/23 0500) SpO2:  [90 %-100 %] 95 % (03/23 0500)  Intake/Output from previous day: 03/22 0701 - 03/23 0700 In: 720 [P.O.:720] Out: 401 [Urine:300; Stool:1; Chest Tube:100]  General appearance: alert, cooperative and no distress Heart: regular rate and rhythm Lungs: clear to auscultation bilaterally Abdomen: soft, non-tender; bowel sounds normal; no masses,  no organomegaly Extremities: extremities normal, atraumatic, no cyanosis or edema Wound: clean and dry  Lab Results: Recent Labs    04/17/18 0450 04/19/18 0137  WBC 13.6* 11.7*  HGB 10.4* 9.6*  HCT 33.4* 29.0*  PLT 248 247   BMET:  Recent Labs    04/18/18 0230 04/19/18 0137  NA 135 137  K 3.6 3.4*  CL 101 100  CO2 28 28  GLUCOSE 95 152*  BUN 9 10  CREATININE 0.59 0.64  CALCIUM 7.7* 7.9*    PT/INR:  Recent Labs    04/19/18 0137  LABPROT 14.6  INR 1.2   ABG    Component Value Date/Time   PHART 7.379 04/16/2018 0425   HCO3 26.3 04/16/2018 0425   O2SAT 98.0 04/16/2018 0425   CBG (last 3)  No results for input(s): GLUCAP in the last 72 hours.  Assessment/Plan: S/P Procedure(s) (LRB): VIDEO ASSISTED THORACOSCOPY (VATS)/LEFT LOWER LOBECTOMY (Left) LEFT UPPER WEDGE RESECTION (Left)  1. Chest tube- no air leak, CXR remains stable- will d/c chest tube today 2. CV- chronic A. Fib, rate is better  controlled with Amiodarone, Cardizem... INR is 1.2, will repeat coumadin at 5 mg daily 3. Pulm- continue aggressive pulmonary toilet 4. ID- afebrile, leukocytosis is back to normal- on Levaquin 5. Dispo- patient stable, will d/c chest tube today, if remains stable likely home in AM   LOS: 4 days    Ellwood Handler 04/19/2018 Patient seen and examined, agree with above  Remo Lipps C. Roxan Hockey, MD [facility] [phone]

## 2018-04-19 NOTE — Care Management Important Message (Signed)
Important Message  Patient Details  Name: [place] MRN: [id number] Date of Birth: 02-25-1940   Medicare Important Message Given:  Yes    Orbie Pyo 04/19/2018, 3:32 PM

## 2018-04-19 NOTE — Progress Notes (Signed)
Fentanyl 13 mls wasted , in stericycle, witnessed by [employer].

## 2018-04-20 ENCOUNTER — Telehealth (INDEPENDENT_AMBULATORY_CARE_PROVIDER_SITE_OTHER): Payer: Self-pay | Admitting: *Deleted

## 2018-04-20 ENCOUNTER — Inpatient Hospital Stay (HOSPITAL_COMMUNITY): Payer: Medicare Other

## 2018-04-20 DIAGNOSIS — C3492 Malignant neoplasm of unspecified part of left bronchus or lung: Secondary | ICD-10-CM

## 2018-04-20 DIAGNOSIS — Q859 Phakomatosis, unspecified: Secondary | ICD-10-CM

## 2018-04-20 LAB — PROTIME-INR
INR: 1.4 — ABNORMAL HIGH (ref 0.8–1.2)
Prothrombin Time: 17.3 seconds — ABNORMAL HIGH (ref 11.4–15.2)

## 2018-04-20 MED ORDER — NICOTINE 7 MG/24HR TD PT24: 7.0000 mg | MEDICATED_PATCH | Freq: Every day | TRANSDERMAL | 0 refills | Status: DC

## 2018-04-20 MED ORDER — TRAMADOL HCL 50 MG PO TABS: 50.0000 mg | ORAL_TABLET | Freq: Four times a day (QID) | ORAL | 0 refills | Status: DC | PRN

## 2018-04-20 MED ORDER — AZITHROMYCIN 250 MG PO TABS: 250.0000 mg | ORAL_TABLET | Freq: Every day | ORAL | 0 refills | Status: DC

## 2018-04-20 NOTE — Plan of Care (Signed)
  Problem: Activity: Goal: Risk for activity intolerance will decrease Outcome: Progressing   Problem: Cardiac: Goal: Will achieve and/or maintain hemodynamic stability Outcome: Progressing   Problem: Clinical Measurements: Goal: Postoperative complications will be avoided or minimized Outcome: Progressing   Problem: Respiratory: Goal: Respiratory status will improve Outcome: Progressing   Problem: Pain Management: Goal: Pain level will decrease Outcome: Progressing   Problem: Skin Integrity: Goal: Wound healing without signs and symptoms infection will improve Outcome: Progressing

## 2018-04-20 NOTE — Progress Notes (Addendum)
      [address]       [place] [address]             [phone]      5 Days Post-Op Procedure(s) (LRB): VIDEO ASSISTED THORACOSCOPY (VATS)/LEFT LOWER LOBECTOMY (Left) LEFT UPPER WEDGE RESECTION (Left)   Subjective:  No complaints.   Denies  Chest pain, shortness of breath.  She is off oxygen.  Really wants to go home.   She states she would do better at as she really misses her family  Objective: Vital signs in last 24 hours: Temp:  [97.4 F (36.3 C)-98.5 F (36.9 C)] 97.5 F (36.4 C) (03/24 0511) Pulse Rate:  [70-107] 88 (03/24 0511) Cardiac Rhythm: Atrial fibrillation (03/23 2012) Resp:  [20-24] 22 (03/24 0511) BP: (93-137)/(67-92) 110/67 (03/24 0511) SpO2:  [85 %-98 %] 90 % (03/24 0511)  Intake/Output from previous day: 03/23 0701 - 03/24 0700 In: 360 [P.O.:360] Out: 950 [Urine:950]  General appearance: alert, cooperative and no distress Heart: irregularly irregular rhythm Lungs: diminished breath sounds on left Abdomen: soft, non-tender; bowel sounds normal; no masses,  no organomegaly Extremities: extremities normal, atraumatic, no cyanosis or edema Wound: clean and dry  Lab Results: Recent Labs    04/19/18 0137  WBC 11.7*  HGB 9.6*  HCT 29.0*  PLT 247   BMET:  Recent Labs    04/18/18 0230 04/19/18 0137  NA 135 137  K 3.6 3.4*  CL 101 100  CO2 28 28  GLUCOSE 95 152*  BUN 9 10  CREATININE 0.59 0.64  CALCIUM 7.7* 7.9*    PT/INR:  Recent Labs    04/20/18 0207  LABPROT 17.3*  INR 1.4*   ABG    Component Value Date/Time   PHART 7.379 04/16/2018 0425   HCO3 26.3 04/16/2018 0425   O2SAT 98.0 04/16/2018 0425   CBG (last 3)  No results for input(s): GLUCAP in the last 72 hours.  Assessment/Plan: S/P Procedure(s) (LRB): VIDEO ASSISTED THORACOSCOPY (VATS)/LEFT LOWER LOBECTOMY (Left) LEFT UPPER WEDGE RESECTION (Left)  1.  CV-  Chronic Atrial Fibrillation- on home Cardizem, INR  1.4,  Continue home regimen of coumadin  2. Pulm- off oxygen, CXR shows stable appearance of pneumothorax, chronic LLL opacity, effusion continue IS at discharge 3. ID- remains afebrile, + bronchitis- continue Zithromax 4. Dispo- patient stable, will d/c home today   LOS: 5 days    Ellwood Handler 04/20/2018 Patient seen and examined, agree with above Her CXR is fine. 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 Roxan Hockey, MD [facility] [phone]

## 2018-04-20 NOTE — Progress Notes (Signed)
Pt stable left unit in wheelchair for home.

## 2018-04-20 NOTE — Telephone Encounter (Signed)
Patient called, stated she needed to cancel TCS sch'd 04/29/18 -- she just got out of hospital from lung surgery

## 2018-04-21 NOTE — Telephone Encounter (Signed)
noted 

## 2018-04-22 ENCOUNTER — Other Ambulatory Visit: Payer: Self-pay | Admitting: *Deleted

## 2018-04-22 DIAGNOSIS — C3432 Malignant neoplasm of lower lobe, left bronchus or lung: Secondary | ICD-10-CM | POA: Diagnosis not present

## 2018-04-22 DIAGNOSIS — M25561 Pain in right knee: Secondary | ICD-10-CM | POA: Diagnosis not present

## 2018-04-22 NOTE — Progress Notes (Signed)
The proposed treatment discussed in cancer conference 04/22/2018 is for discussion purpose only and not a binding recommendation.  The patient was not physically examined nor present for their treatment options.  Therefore, final treatment plans cannot be decided.

## 2018-04-23 ENCOUNTER — Telehealth: Payer: Self-pay | Admitting: *Deleted

## 2018-04-23 NOTE — Telephone Encounter (Signed)

## 2018-04-26 ENCOUNTER — Ambulatory Visit (INDEPENDENT_AMBULATORY_CARE_PROVIDER_SITE_OTHER): Payer: Medicare Other | Admitting: *Deleted

## 2018-04-26 ENCOUNTER — Other Ambulatory Visit: Payer: Self-pay

## 2018-04-26 DIAGNOSIS — I482 Chronic atrial fibrillation, unspecified: Secondary | ICD-10-CM

## 2018-04-26 DIAGNOSIS — Z5181 Encounter for therapeutic drug level monitoring: Secondary | ICD-10-CM | POA: Diagnosis not present

## 2018-04-26 LAB — POCT INR
INR: 1.1 — AB (ref 2.0–3.0)
INR: 1.1 — AB (ref 2.0–3.0)

## 2018-04-26 NOTE — Patient Instructions (Signed)
Take 1 1/2 tablets tonight and tomorrow night then restart coumadin 1 tablet daily except 1/2 tablet on Mondays and Fridays. Recheck in 1 week.

## 2018-04-27 ENCOUNTER — Other Ambulatory Visit: Payer: Self-pay | Admitting: Physician Assistant

## 2018-04-27 DIAGNOSIS — G8918 Other acute postprocedural pain: Secondary | ICD-10-CM

## 2018-04-28 ENCOUNTER — Other Ambulatory Visit: Payer: Self-pay | Admitting: Physician Assistant

## 2018-04-28 DIAGNOSIS — G8918 Other acute postprocedural pain: Secondary | ICD-10-CM

## 2018-04-29 ENCOUNTER — Encounter (HOSPITAL_COMMUNITY): Admission: RE | Payer: Self-pay | Source: Home / Self Care

## 2018-04-29 ENCOUNTER — Ambulatory Visit (HOSPITAL_COMMUNITY): Admission: RE | Admit: 2018-04-29 | Payer: Medicare Other | Source: Home / Self Care | Admitting: Internal Medicine

## 2018-04-29 SURGERY — COLONOSCOPY
Anesthesia: Moderate Sedation

## 2018-04-30 ENCOUNTER — Telehealth: Payer: Self-pay | Admitting: Cardiovascular Disease

## 2018-04-30 ENCOUNTER — Other Ambulatory Visit (HOSPITAL_COMMUNITY): Payer: Medicare Other

## 2018-04-30 NOTE — Telephone Encounter (Signed)
° ° ° °  COVID-19 Pre-Screening Questions: ° °• Do you currently have a fever? No °•  °• Have you recently travelled on a cruise, internationally, or to [place], [place], [place], [place], [place], or [place], [place] ([employer]) ? No °•  °• Have you been in contact with someone that is currently pending confirmation of Covid19 testing or has been confirmed to have the Covid19 virus? No °•  °• Are you currently experiencing fatigue or cough? No  ° ° °   ° ° ° ° ° °

## 2018-05-03 ENCOUNTER — Ambulatory Visit (INDEPENDENT_AMBULATORY_CARE_PROVIDER_SITE_OTHER): Payer: Medicare Other | Admitting: *Deleted

## 2018-05-03 ENCOUNTER — Other Ambulatory Visit: Payer: Self-pay

## 2018-05-03 DIAGNOSIS — I482 Chronic atrial fibrillation, unspecified: Secondary | ICD-10-CM

## 2018-05-03 DIAGNOSIS — Z5181 Encounter for therapeutic drug level monitoring: Secondary | ICD-10-CM

## 2018-05-03 LAB — POCT INR: INR: 2.4 (ref 2.0–3.0)

## 2018-05-03 NOTE — Patient Instructions (Signed)
Continue coumadin 1 tablet daily except 1/2 tablet on Mondays and Fridays. Recheck in 2 weeks.

## 2018-05-04 ENCOUNTER — Ambulatory Visit
Admission: RE | Admit: 2018-05-04 | Discharge: 2018-05-04 | Disposition: A | Discharge status: Home / Self Care | Payer: Medicare Other | Source: Ambulatory Visit | Attending: Thoracic Surgery (Cardiothoracic Vascular Surgery) | Admitting: Thoracic Surgery (Cardiothoracic Vascular Surgery)

## 2018-05-04 ENCOUNTER — Ambulatory Visit (INDEPENDENT_AMBULATORY_CARE_PROVIDER_SITE_OTHER): Payer: Self-pay | Admitting: Thoracic Surgery (Cardiothoracic Vascular Surgery)

## 2018-05-04 ENCOUNTER — Other Ambulatory Visit: Payer: Self-pay | Admitting: Thoracic Surgery (Cardiothoracic Vascular Surgery)

## 2018-05-04 VITALS — BP 114/80 | HR 92 | Temp 97.4°F | Resp 20 | Ht 60.0 in | Wt 112.0 lb

## 2018-05-04 DIAGNOSIS — Z902 Acquired absence of lung [part of]: Secondary | ICD-10-CM

## 2018-05-04 DIAGNOSIS — C3492 Malignant neoplasm of unspecified part of left bronchus or lung: Secondary | ICD-10-CM

## 2018-05-04 DIAGNOSIS — C3432 Malignant neoplasm of lower lobe, left bronchus or lung: Secondary | ICD-10-CM

## 2018-05-04 NOTE — Progress Notes (Signed)
[address]       [place], [address]             [phone]      HPI: Marissa Turner returns for a scheduled follow-up visit  Marissa Turner is a 78 year old woman with a history of tobacco abuse, COPD, large cell lymphoma, hypertension, chronic atrial fibrillation, and degenerative joint disease.  She was found to have a nodule on a chest x-ray that was done prior to a hip replacement.  A CT of the chest showed a 3.3 cm left lower lobe mass as well as a stable 1 cm left upper lobe nodule that has been present for some time.  On PET CT the lower lobe mass was hypermetabolic.  Findings were consistent with a stage IB non-small cell carcinoma.  I did a thoracoscopic left lower lobectomy on 04/15/2018.  She did well postoperatively and went home on day 5.  The mass turned out to be a 4 cm squamous cell carcinoma, pathologic stage IIA- T2a, N0.  She is doing well.  She is taking tramadol about once a day for pain.  She is still limited by her left hip pain.  She is anxious to increase her activities.  She has not smoked since surgery.  Past Medical History:  Diagnosis Date  . Atrial fibrillation, chronic 2004   left atrial thrombus in 2004; near syncope in 2008; echocardiogram in 2008-normal EF, mild left atrial enlargement, no valvular abnormalities, lipomatous hypertrophy; no aortic atheroma on TEE in 2004  . Cancer (HCC)    LYMPH NODE  . Chronic anticoagulation 2004   warfarin  . Depression   . DJD (degenerative joint disease), lumbosacral    Also hip  . Dysrhythmia    chronic AFib  . Fracture of wrist    Left  . Hypotension   . Panic attacks   . Peptic ulcer disease    s/p Billroth II  . PONV (postoperative nausea and vomiting)   . Tobacco abuse    40 pack years; quit attempt in 2012    Current Outpatient Medications  Medication Sig Dispense Refill  . acetaminophen (TYLENOL) 500 MG tablet Take 1,000 mg by mouth every 6 (six) hours as needed for moderate  pain or headache.     . ALPRAZolam (XANAX) 1 MG tablet Take 1 tablet (1 mg total) by mouth 3 (three) times daily as needed for anxiety. (Patient taking differently: Take 1 mg by mouth 3 (three) times daily as needed for anxiety. ) 30 tablet 0  . calcium-vitamin D (OSCAL WITH D) 500-200 MG-UNIT tablet Take 1 tablet by mouth daily with breakfast. 60 tablet 2  . citalopram (CELEXA) 20 MG tablet TAKE ONE (1) TABLET BY MOUTH EVERY DAY (Patient taking differently: Take 20 mg by mouth daily. ) 30 tablet 5  . diltiazem (CARDIZEM CD) 240 MG 24 hr capsule Take 1 capsule (240 mg total) by mouth daily. 90 capsule 3  . ferrous sulfate 325 (65 FE) MG tablet Take 325 mg by mouth daily with breakfast.    . nicotine (NICODERM CQ - DOSED IN MG/24 HR) 7 mg/24hr patch Place 1 patch (7 mg total) onto the skin daily. 28 patch 0  . traMADol (ULTRAM) 50 MG tablet TAKE 1 TO 2 TABLETS BY MOUTH EVERY 6 HOURS AS NEEDED FOR MILD PAIN 28 tablet 1  . warfarin (COUMADIN) 5 MG tablet TAKE ONE TABLELT ONCE DAILY EXCEPT TAKE 1/2 TABLET ON MONDAY, WEDNESDAY ANDFRIDAY (  Patient taking differently: Take 2.5-5 mg by mouth See admin instructions. Take 2.5 mg by mouth on Monday and Friday and 5mg  on all other days.) 30 tablet 4   No current facility-administered medications for this visit.     Physical Exam BP 114/80   Pulse 92   Temp (!) 97.4 F (36.3 C) (Oral)   Resp 20   Ht 5' (1.524 m)   Wt 112 lb (50.8 kg)   SpO2 96% Comment: RA  BMI 21.27 kg/m  78 year old woman in no acute distress Alert and oriented x3 with no focal deficits Lungs diminished at left base, otherwise clear Cardiac regular rate and rhythm normal S1 and S2 Incisions healing well  Diagnostic Tests: CHEST - 2 VIEW  COMPARISON:  04/20/2018  FINDINGS: Cardiac shadow is stable. Postsurgical changes are noted in the left chest with mild volume loss. Some improved aeration in the left base is noted when compare with the prior exam. Chronic pleural  effusion is again noted. Apical pneumothorax has resolved in the interval. No new focal infiltrate is seen. No acute bony abnormality is noted.  IMPRESSION: Chronic changes in the left base with stable loculated effusion.  Resolution of previously seen apical pneumothorax on the left.   Electronically Signed   By: Inez Catalina M.D.   On: 05/04/2018 10:06 I personally reviewed the chest x-ray images and concur with the findings noted above  Impression: Marissa Turner is a 78 year old woman with a history of tobacco abuse, COPD, paroxysmal atrial fibrillation, degenerative joint disease, and large cell lymphoma.  She recently was found to have a left lower lobe lung mass.  That turned out to be a T2, N0, stage IIa squamous cell carcinoma.  She had left lower lobectomy on 04/15/2018.  She is now about 2-1/2 weeks out from surgery.  She is doing very well.  She has not had respiratory issues.  She has some mild incisional pain is taking tramadol about once a day for that.  There are no restrictions on her activities but she was up for advised to avoid being around other people due to [place].  She may begin driving on a limited basis.  Appropriate precautions were discussed.  Tobacco abuse-congratulated on her for not smoking since her surgery.  Emphasized importance of continued abstinence.  She does need to follow-up with Dr. Walden Field to see if any adjuvant chemotherapy is warranted.  Plan: Follow-up with Dr. Ishmael Holter Return in 2 months with PA and lateral chest x-ray  Melrose Nakayama, MD [facility] [phone]

## 2018-05-07 ENCOUNTER — Ambulatory Visit (HOSPITAL_COMMUNITY): Payer: Medicare Other | Admitting: Hematology

## 2018-05-11 ENCOUNTER — Other Ambulatory Visit (HOSPITAL_COMMUNITY): Payer: Self-pay | Admitting: *Deleted

## 2018-05-11 ENCOUNTER — Encounter (HOSPITAL_COMMUNITY): Payer: Self-pay | Admitting: Hematology

## 2018-05-11 ENCOUNTER — Other Ambulatory Visit: Payer: Self-pay

## 2018-05-11 ENCOUNTER — Inpatient Hospital Stay (HOSPITAL_COMMUNITY): Payer: Medicare Other | Attending: Hematology | Admitting: Hematology

## 2018-05-11 DIAGNOSIS — Z79899 Other long term (current) drug therapy: Secondary | ICD-10-CM | POA: Diagnosis not present

## 2018-05-11 DIAGNOSIS — Z8572 Personal history of non-Hodgkin lymphomas: Secondary | ICD-10-CM | POA: Insufficient documentation

## 2018-05-11 DIAGNOSIS — Z902 Acquired absence of lung [part of]: Secondary | ICD-10-CM | POA: Diagnosis not present

## 2018-05-11 DIAGNOSIS — Z9221 Personal history of antineoplastic chemotherapy: Secondary | ICD-10-CM | POA: Diagnosis not present

## 2018-05-11 DIAGNOSIS — C8580 Other specified types of non-Hodgkin lymphoma, unspecified site: Secondary | ICD-10-CM

## 2018-05-11 DIAGNOSIS — C3432 Malignant neoplasm of lower lobe, left bronchus or lung: Secondary | ICD-10-CM | POA: Insufficient documentation

## 2018-05-11 DIAGNOSIS — F17210 Nicotine dependence, cigarettes, uncomplicated: Secondary | ICD-10-CM | POA: Insufficient documentation

## 2018-05-11 DIAGNOSIS — Z923 Personal history of irradiation: Secondary | ICD-10-CM | POA: Diagnosis not present

## 2018-05-11 DIAGNOSIS — Z7901 Long term (current) use of anticoagulants: Secondary | ICD-10-CM | POA: Diagnosis not present

## 2018-05-11 DIAGNOSIS — I482 Chronic atrial fibrillation, unspecified: Secondary | ICD-10-CM | POA: Insufficient documentation

## 2018-05-11 DIAGNOSIS — C3492 Malignant neoplasm of unspecified part of left bronchus or lung: Secondary | ICD-10-CM

## 2018-05-11 NOTE — Patient Instructions (Addendum)
[place] at [facility] Discharge Instructions  You were seen today by Dr. Delton Coombes. He went over your recent biopsy results. You have squamous cell, non-small cell lung cancer. He would like you to have chemotherapy. The treatments would be over 12 weeks, 1 cycle every 3 weeks.  He would like you to try drinking Ensure Clear to help boost your nutrition. He will see you back in 2 weeks for labs, treatment and follow up.   Thank you for choosing [place] at [facility] to provide your oncology and hematology care.  To afford each patient quality time with our provider, please arrive at least 15 minutes before your scheduled appointment time.   If you have a lab appointment with the [facility] please come in thru the  Main Entrance and check in at the main information desk  You need to re-schedule your appointment should you arrive 10 or more minutes late.  We strive to give you quality time with our providers, and arriving late affects you and other patients whose appointments are after yours.  Also, if you no show three or more times for appointments you may be dismissed from the clinic at the providers discretion.     Again, thank you for choosing [facility].  Our hope is that these requests will decrease the amount of time that you wait before being seen by our physicians.       _____________________________________________________________  Should you have questions after your visit to [facility], please contact our office at [phone] between the hours of 8:00 a.m. and 4:30 p.m.  Voicemails left after 4:00 p.m. will not be returned until the following business day.  For prescription refill requests, have your pharmacy contact our office and allow 72 hours.    Cancer Center Support Programs:   > Cancer Support Group  2nd Tuesday of the month 1pm-2pm, Journey Room

## 2018-05-11 NOTE — Progress Notes (Signed)
PICC line ordered per Dr. Delton Coombes for chemotherapy administration.

## 2018-05-11 NOTE — Progress Notes (Signed)
START OFF PATHWAY REGIMEN - Non-Small Cell Lung   [id number]:Carboplatin AUC=6 + Paclitaxel 200 mg/m2 q21 Days:   A cycle is every 21 days:     Paclitaxel      Carboplatin   **Always confirm dose/schedule in your pharmacy ordering system**  Patient Characteristics: Stage IB - Resected AJCC T Category: T2a Current Disease Status: No Distant Mets or Local Recurrence AJCC N Category: N0 AJCC M Category: M0 AJCC 8 Stage Grouping: IB Intent of Therapy: Curative Intent, Discussed with Patient

## 2018-05-11 NOTE — Assessment & Plan Note (Signed)
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 -Pathology shows poorly differentiated squamous cell carcinoma, 4 cm, no visceral pleural invasion, and lymph node negative. -I had a prolonged discussion with the patient about the pathology report in detail.  We have discussed NCCN guidelines for recommendation of adjuvant chemotherapy for lung cancer.  She has poor prognostic features including tumor size of 4 cm or more and poorly differentiation.  She has a good performance status. - We discussed the pros and cons of adjuvant chemotherapy versus observation.  Combined decision with the patient was made in favor of adjuvant chemotherapy. -Because of her advanced age, I have recommended carboplatinum rather than cisplatin based chemotherapy.  She will receive carboplatin and paclitaxel for 4 cycles with growth factor support.  We discussed the various side effects including but not limited to allergic reactions, neuropathy, marrow suppression, increased risk of infections among others. -We also discussed the need for central line.  She is more favoring a PICC line rather than port.  We will arrange PICC line placement.  2.  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  She has been disease-free for more than 5 years.

## 2018-05-11 NOTE — Progress Notes (Signed)
Marissa Turner, [place] [address]   CLINIC:  Medical Oncology/Hematology  PCP:  Celene Squibb, MD [place] [address] [phone]   REASON FOR VISIT:  Follow-up for Lymphoma malignant, large cell , left lung surgery   BRIEF ONCOLOGIC HISTORY:    Lymphoma malignant, large cell ([place])   09/15/2013 Initial Diagnosis    Diagnosis Lymph node, biopsy, cervical left neck - DIFFUSE LARGE B CELL LYMPHOMA.    10/06/2013 PET scan    Hypermetabolic thickening within the right vallecular consists with primary head and neck cancer.  Single enlarged hypermetabolic right level IIa lymph node consistent local nodal metastasis.    10/31/2013 - 11/29/2013 Chemotherapy    Bendamustine/Rituxan x 2 cycles    12/19/2013 Progression    PET- Progression of presumed vallecular lymphoma as evidenced by increased hypermetabolism.    12/20/2013 Treatment Plan Change    Per Dr. Gari Crown, patient has a CD10 negative, non-germinal center non-Hodgkin's lymphoma.  Patient may be a candidate for additional of Revlimid to treatment plan.    12/28/2013 Echocardiogram    MUGA- Normal left ventricular wall motion with calculated ejection fraction of 60%.    01/02/2014 - 02/13/2014 Chemotherapy    R-CHOP with Neulasta support.    02/06/2014 Remission    PET- Resolution of the previously enlarged right-sided station 2 lymph  node in the neck with resolution of the abnormal right vallecular  activity in the neck. Currently no specific in findings of residual  lymphoma.     - 04/12/2014 Radiation Therapy    Dr. Lisbeth Renshaw    07/12/2014 Procedure    Port removed- Dr. Arnoldo Morale     Squamous cell lung cancer, left ([place])   04/20/2018 Initial Diagnosis    Squamous cell lung cancer, left ([place])    05/24/2018 -  Chemotherapy    The patient had palonosetron (ALOXI) injection 0.25 mg, 0.25 mg, Intravenous,  Once, 0 of 4 cycles pegfilgrastim-cbqv (UDENYCA) injection 6 mg, 6 mg,  Subcutaneous, Once, 0 of 4 cycles CARBOplatin (PARAPLATIN) in sodium chloride 0.9 % 100 mL chemo infusion, , Intravenous,  Once, 0 of 4 cycles Dose modification:   (Cycle 4) PACLitaxel (TAXOL) 252 mg in sodium chloride 0.9 % 250 mL chemo infusion (> 80mg /m2), 175 mg/m2 = 252 mg (original dose ), Intravenous,  Once, 0 of 4 cycles Dose modification: 175 mg/m2 (Cycle 1, Reason: Provider Judgment) fosaprepitant (EMEND) 150 mg, dexamethasone (DECADRON) 20 mg in sodium chloride 0.9 % 145 mL IVPB, , Intravenous,  Once, 0 of 4 cycles  for chemotherapy treatment.       CANCER STAGING: Cancer Staging Lymphoma malignant, large cell [facility]) Staging form: Lymphoid Neoplasms, AJCC 6th Edition - Clinical stage from 01/22/2014: Stage IE - Signed by Baird Cancer, PA-C on 04/07/2016    INTERVAL HISTORY:  Marissa Turner 78 y.o. female returns for routine follow-up. She is here today alone. She states that she had a fall last Saturday and is still sore. Denies any nausea, vomiting, or diarrhea. Denies any new pains. Had not noticed any recent bleeding such as epistaxis, hematuria or hematochezia. Denies recent chest pain on exertion, shortness of breath on minimal exertion, pre-syncopal episodes, or palpitations. Denies any numbness or tingling in hands or feet. Denies any recent fevers, infections, or recent hospitalizations. Patient reports appetite at 100% and energy level at 100%.  REVIEW OF SYSTEMS:  Review of Systems  All other systems reviewed and are negative.  PAST MEDICAL/SURGICAL HISTORY:  Past Medical History:  Diagnosis Date  . Atrial fibrillation, chronic 2004   left atrial thrombus in 2004; near syncope in 2008; echocardiogram in 2008-normal EF, mild left atrial enlargement, no valvular abnormalities, lipomatous hypertrophy; no aortic atheroma on TEE in 2004  . Cancer (HCC)    LYMPH NODE  . Chronic anticoagulation 2004   warfarin  . Depression   . DJD (degenerative joint disease),  lumbosacral    Also hip  . Dysrhythmia    chronic AFib  . Fracture of wrist    Left  . Hypotension   . Panic attacks   . Peptic ulcer disease    s/p Billroth II  . PONV (postoperative nausea and vomiting)   . Tobacco abuse    40 pack years; quit attempt in 2012   Past Surgical History:  Procedure Laterality Date  . ABDOMINAL HYSTERECTOMY     w/o oophorectomy  . APPENDECTOMY    . CESAREAN SECTION    . CHOLECYSTECTOMY  2004   [place]   patient denies ever having undergone colonoscopy as of 2014  . GASTROJEJUNOSTOMY  1984   Billroth II; splenectomy; incidental appendectomy  . KNEE ARTHROSCOPY WITH MEDIAL MENISECTOMY Right 02/26/2015   Procedure: KNEE ARTHROSCOPY WITH MEDIAL MENISECTOMY;  Surgeon: Sanjuana Kava, MD;  Location: [facility];  Service: Orthopedics;  Laterality: Right;  . LYMPH NODE BIOPSY Left 09/15/2013   Procedure: LEFT NECK CERVICAL NODE BIOPSY;  Surgeon: Scherry Ran, MD;  Location: [facility];  Service: General;  Laterality: Left;  . PORT-A-CATH REMOVAL Right 07/12/2014   Procedure: REMOVAL PORT-A-CATH;  Surgeon: Aviva Signs Md, MD;  Location: [facility];  Service: General;  Laterality: Right;  . PORTACATH PLACEMENT Right 10/21/2013   Procedure: ATTEMPTED INSERTION PORT-A-CATH;  Surgeon: Scherry Ran, MD;  Location: [facility];  Service: General;  Laterality: Right;  . SPLENECTOMY, TOTAL    . THORACOSCOPY Left 04/15/2018   VIDEO ASSISTED THORACOSCOPY (VATS)/LEFT LOWER LOBECTOMY (Left)  . TONSILLECTOMY    . VIDEO ASSISTED THORACOSCOPY (VATS)/ LOBECTOMY Left 04/15/2018   Procedure: VIDEO ASSISTED THORACOSCOPY (VATS)/LEFT LOWER LOBECTOMY;  Surgeon: Melrose Nakayama, MD;  Location: [place];  Service: Thoracic;  Laterality: Left;  . WEDGE RESECTION Left 04/15/2018   Procedure: LEFT UPPER WEDGE RESECTION;  Surgeon: Melrose Nakayama, MD;  Location: [facility];  Service: Thoracic;  Laterality: Left;     SOCIAL HISTORY:  Social History    Socioeconomic History  . Marital status: Married    Spouse name: Not on file  . Number of children: 2  . Years of education: Not on file  . Highest education level: Not on file  Occupational History  . Occupation: Housecleaning  Social Needs  . Financial resource strain: Not on file  . Food insecurity:    Worry: Not on file    Inability: Not on file  . Transportation needs:    Medical: Not on file    Non-medical: Not on file  Tobacco Use  . Smoking status: Current Every Day Smoker    Packs/day: 0.50    Years: 55.00    Pack years: 27.50    Types: Cigarettes  . Smokeless tobacco: Never Used  . Tobacco comment: 2-3 cigarettes a day  Substance and Sexual Activity  . Alcohol use: No  . Drug use: No  . Sexual activity: Yes    Birth control/protection: Surgical  Lifestyle  . Physical activity:    Days per week:  Not on file    Minutes per session: Not on file  . Stress: Not on file  Relationships  . Social connections:    Talks on phone: Not on file    Gets together: Not on file    Attends religious service: Not on file    Active member of club or organization: Not on file    Attends meetings of clubs or organizations: Not on file    Relationship status: Not on file  . Intimate partner violence:    Fear of current or ex partner: Not on file    Emotionally abused: Not on file    Physically abused: Not on file    Forced sexual activity: Not on file  Other Topics Concern  . Not on file  Social History Narrative   Married with one child and one adopted child, 1 stillbirth, and one deceased child at age 11 due to illness   No regular exercise    FAMILY HISTORY:  Family History  Problem Relation Age of Onset  . Heart attack Mother        also brother  . Breast cancer Sister   . Heart attack Brother   . Breast cancer Sister        x2  . Cancer Brother        Gastric carcinoma; also father  . Cirrhosis Sister     CURRENT MEDICATIONS:  Outpatient Encounter  Medications as of 05/11/2018  Medication Sig Note  . acetaminophen (TYLENOL) 500 MG tablet Take 1,000 mg by mouth every 6 (six) hours as needed for moderate pain or headache.    . ALPRAZolam (XANAX) 1 MG tablet Take 1 tablet (1 mg total) by mouth 3 (three) times daily as needed for anxiety. (Patient taking differently: Take 1 mg by mouth 3 (three) times daily as needed for anxiety. )   . calcium-vitamin D (OSCAL WITH D) 500-200 MG-UNIT tablet Take 1 tablet by mouth daily with breakfast.   . CARTIA XT 240 MG 24 hr capsule    . citalopram (CELEXA) 20 MG tablet TAKE ONE (1) TABLET BY MOUTH EVERY DAY (Patient taking differently: Take 20 mg by mouth daily. )   . nicotine (NICODERM CQ - DOSED IN MG/24 HR) 7 mg/24hr patch Place 1 patch (7 mg total) onto the skin daily.   . traMADol (ULTRAM) 50 MG tablet TAKE 1 TO 2 TABLETS BY MOUTH EVERY 6 HOURS AS NEEDED FOR MILD PAIN   . warfarin (COUMADIN) 5 MG tablet TAKE ONE TABLELT ONCE DAILY EXCEPT TAKE 1/2 TABLET ON MONDAY, WEDNESDAY ANDFRIDAY (Patient taking differently: Take 2.5-5 mg by mouth See admin instructions. Take 2.5 mg by mouth on Monday and Friday and 5mg  on all other days.) 04/12/2018: On hold for procedure  . diltiazem (CARDIZEM CD) 240 MG 24 hr capsule Take 1 capsule (240 mg total) by mouth daily.   . ferrous sulfate 325 (65 FE) MG tablet Take 325 mg by mouth daily with breakfast.    No facility-administered encounter medications on file as of 05/11/2018.     ALLERGIES:  Allergies  Allergen Reactions  . Estrogens Swelling    edema  . Codeine Swelling and Rash  . Penicillins Other (See Comments)    Break out in whelps. Did it involve swelling of the face/tongue/throat, SOB, or low BP? No Did it involve sudden or severe rash/hives, skin peeling, or any reaction on the inside of your mouth or nose? No Did you need to seek medical attention  at a hospital or doctor's office? No When did it last happen? Childhood allergy If all above answers  are "NO", may proceed with cephalosporin use.   . Diphenhydramine Hcl Other (See Comments)    hallucinations      PHYSICAL EXAM:  ECOG Performance status: 1  Vitals:   05/11/18 1117  BP: 131/80  Pulse: 67  Temp: 98.6 F (37 C)  SpO2: 96%   Filed Weights   05/11/18 1117  Weight: 107 lb 11.2 oz (48.9 kg)    Physical Exam Vitals signs reviewed.  Constitutional:      Appearance: Normal appearance.  Cardiovascular:     Rate and Rhythm: Normal rate and regular rhythm.     Heart sounds: Normal heart sounds.  Pulmonary:     Effort: Pulmonary effort is normal.     Breath sounds: Normal breath sounds.  Abdominal:     General: Bowel sounds are normal. There is no distension.     Palpations: Abdomen is soft. There is no mass.  Musculoskeletal:        General: No swelling.  Skin:    General: Skin is warm.  Neurological:     General: No focal deficit present.     Mental Status: She is alert and oriented to person, place, and time.  Psychiatric:        Mood and Affect: Mood normal.        Behavior: Behavior normal.      LABORATORY DATA:  I have reviewed the labs as listed.  CBC    Component Value Date/Time   WBC 11.7 (H) 04/19/2018 0137   RBC 3.30 (L) 04/19/2018 0137   HGB 9.6 (L) 04/19/2018 0137   HCT 29.0 (L) 04/19/2018 0137   PLT 247 04/19/2018 0137   MCV 87.9 04/19/2018 0137   MCH 29.1 04/19/2018 0137   MCHC 33.1 04/19/2018 0137   RDW 15.4 04/19/2018 0137   LYMPHSABS 4.1 (H) 02/26/2018 1154   MONOABS 0.8 02/26/2018 1154   EOSABS 0.7 (H) 02/26/2018 1154   BASOSABS 0.1 02/26/2018 1154   CMP Latest Ref Rng & Units 04/19/2018 04/18/2018 04/17/2018  Glucose 70 - 99 mg/dL 152(H) 95 101(H)  BUN 8 - 23 mg/dL 10 9 7(L)  Creatinine 0.44 - 1.00 mg/dL 0.64 0.59 0.63  Sodium 135 - 145 mmol/L 137 135 138  Potassium 3.5 - 5.1 mmol/L 3.4(L) 3.6 3.5  Chloride 98 - 111 mmol/L 100 101 100  CO2 22 - 32 mmol/L 28 28 29   Calcium 8.9 - 10.3 mg/dL 7.9(L) 7.7(L) 8.1(L)  Total  Protein 6.5 - 8.1 g/dL - - 5.6(L)  Total Bilirubin 0.3 - 1.2 mg/dL - - 1.0  Alkaline Phos 38 - 126 U/L - - 105  AST 15 - 41 U/L - - 195(H)  ALT 0 - 44 U/L - - 190(H)       DIAGNOSTIC IMAGING:  I have independently reviewed the scans and discussed with the patient.   I have reviewed Venita Lick LPN's note and agree with the documentation.  I personally performed a face-to-face visit, made revisions and my assessment and plan is as follows.    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 -Pathology shows poorly differentiated squamous cell carcinoma, 4 cm, no visceral pleural invasion, and lymph node negative. -I had a prolonged discussion with the patient about the pathology report in detail.  We have discussed NCCN guidelines for recommendation of adjuvant chemotherapy for lung cancer.  She has poor prognostic features including tumor size of 4 cm or more and poorly differentiation.  She has a good performance status. - We discussed the pros and cons of adjuvant chemotherapy versus observation.  Combined decision with the patient was made in favor of adjuvant chemotherapy. -Because of her advanced age, I have recommended carboplatinum rather than cisplatin based chemotherapy.  She will receive carboplatin and paclitaxel for 4 cycles with growth factor support.  We discussed the various side effects including but not limited to allergic reactions, neuropathy, marrow suppression, increased risk of infections among others. -We also discussed the need for central line.  She is more favoring a PICC line rather than port.  We will arrange PICC line placement.  2.  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  She has been disease-free for more than 5 years.   Total time spent is 40 minutes with more than 50% of the time  spent face-to-face discussing new diagnosis, pathology, prognosis, chemotherapy, side effects and coordination of care.  Orders placed this encounter:  No orders of the defined types were placed in this encounter.     Derek Jack, MD [place] [phone]

## 2018-05-12 ENCOUNTER — Other Ambulatory Visit (HOSPITAL_COMMUNITY): Payer: Self-pay | Admitting: *Deleted

## 2018-05-17 ENCOUNTER — Ambulatory Visit (INDEPENDENT_AMBULATORY_CARE_PROVIDER_SITE_OTHER): Payer: Medicare Other | Admitting: *Deleted

## 2018-05-17 DIAGNOSIS — Z5181 Encounter for therapeutic drug level monitoring: Secondary | ICD-10-CM | POA: Diagnosis not present

## 2018-05-17 DIAGNOSIS — I482 Chronic atrial fibrillation, unspecified: Secondary | ICD-10-CM | POA: Diagnosis not present

## 2018-05-17 LAB — POCT INR: INR: 5.6 — AB (ref 2.0–3.0)

## 2018-05-17 NOTE — Patient Instructions (Signed)
Hold coumadin tonight and tomorrow night then decrease dose to 1/2 tablet daily except 1 tablet on Sundays, Tuesdays and Thursdays.  Recheck in 1 week

## 2018-05-18 ENCOUNTER — Other Ambulatory Visit (HOSPITAL_COMMUNITY): Payer: Self-pay | Admitting: *Deleted

## 2018-05-18 DIAGNOSIS — C8580 Other specified types of non-Hodgkin lymphoma, unspecified site: Secondary | ICD-10-CM

## 2018-05-18 DIAGNOSIS — C3492 Malignant neoplasm of unspecified part of left bronchus or lung: Secondary | ICD-10-CM

## 2018-05-21 ENCOUNTER — Other Ambulatory Visit: Payer: Self-pay | Admitting: Cardiovascular Disease

## 2018-05-21 DIAGNOSIS — F411 Generalized anxiety disorder: Secondary | ICD-10-CM | POA: Diagnosis not present

## 2018-05-21 DIAGNOSIS — M170 Bilateral primary osteoarthritis of knee: Secondary | ICD-10-CM | POA: Diagnosis not present

## 2018-05-21 DIAGNOSIS — D509 Iron deficiency anemia, unspecified: Secondary | ICD-10-CM | POA: Diagnosis not present

## 2018-05-21 DIAGNOSIS — M479 Spondylosis, unspecified: Secondary | ICD-10-CM | POA: Diagnosis not present

## 2018-05-21 DIAGNOSIS — Z85118 Personal history of other malignant neoplasm of bronchus and lung: Secondary | ICD-10-CM | POA: Diagnosis not present

## 2018-05-21 DIAGNOSIS — I4891 Unspecified atrial fibrillation: Secondary | ICD-10-CM | POA: Diagnosis not present

## 2018-05-24 ENCOUNTER — Other Ambulatory Visit: Payer: Self-pay

## 2018-05-24 ENCOUNTER — Ambulatory Visit (INDEPENDENT_AMBULATORY_CARE_PROVIDER_SITE_OTHER): Payer: Medicare Other | Admitting: *Deleted

## 2018-05-24 DIAGNOSIS — I482 Chronic atrial fibrillation, unspecified: Secondary | ICD-10-CM

## 2018-05-24 DIAGNOSIS — Z5181 Encounter for therapeutic drug level monitoring: Secondary | ICD-10-CM

## 2018-05-24 LAB — POCT INR: INR: 1.3 — AB (ref 2.0–3.0)

## 2018-05-24 NOTE — Patient Instructions (Signed)
Take coumadin 1 tablet tonight then resume 1/2 tablet daily except 1 tablet on Sundays, Tuesdays and Thursdays. Stop drinking boost for now  Recheck in 1 week

## 2018-05-27 ENCOUNTER — Other Ambulatory Visit (HOSPITAL_COMMUNITY): Payer: Medicare Other

## 2018-05-27 ENCOUNTER — Ambulatory Visit (HOSPITAL_COMMUNITY): Payer: Medicare Other | Admitting: Hematology

## 2018-05-27 ENCOUNTER — Ambulatory Visit (HOSPITAL_COMMUNITY): Payer: Medicare Other

## 2018-05-27 ENCOUNTER — Inpatient Hospital Stay (HOSPITAL_COMMUNITY): Payer: Medicare Other

## 2018-06-03 ENCOUNTER — Other Ambulatory Visit: Payer: Self-pay

## 2018-06-03 ENCOUNTER — Ambulatory Visit (INDEPENDENT_AMBULATORY_CARE_PROVIDER_SITE_OTHER): Payer: Medicare Other | Admitting: *Deleted

## 2018-06-03 DIAGNOSIS — Z5181 Encounter for therapeutic drug level monitoring: Secondary | ICD-10-CM | POA: Diagnosis not present

## 2018-06-03 DIAGNOSIS — I482 Chronic atrial fibrillation, unspecified: Secondary | ICD-10-CM | POA: Diagnosis not present

## 2018-06-03 LAB — POCT INR: INR: 2.7 (ref 2.0–3.0)

## 2018-06-03 NOTE — Patient Instructions (Signed)
Continue coumadin 1/2 tablet daily except 1 tablet on Sundays, Tuesdays and Thursdays. Stopped boost  Recheck in 2 weeks

## 2018-06-16 ENCOUNTER — Telehealth: Payer: Self-pay | Admitting: *Deleted

## 2018-06-16 NOTE — Telephone Encounter (Signed)
° ° °  COVID-19 Pre-Screening Questions:   In the past 7 to 10 days have you had a cough,  shortness of breath, headache, congestion, fever, body aches, chills, sore throat, or sudden loss of taste or sense of smell?  NO  Have you been around anyone with known Covid 19.  NO  Have you been around anyone who is awaiting Covid 19 test results in the past 7 to 10 days?  NO  Have you been around anyone who has been exposed to Covid 19, or has mentioned symptoms of Covid 19 within the past 7 to 10 days?  NO  If you have any concerns/questions about symptoms patients report during screening (either on the phone or at threshold). Contact the provider seeing the patient or DOD for further guidance.  If neither are available contact a member of the leadership team.

## 2018-06-17 ENCOUNTER — Ambulatory Visit (INDEPENDENT_AMBULATORY_CARE_PROVIDER_SITE_OTHER): Payer: Medicare Other | Admitting: *Deleted

## 2018-06-17 ENCOUNTER — Other Ambulatory Visit: Payer: Self-pay

## 2018-06-17 DIAGNOSIS — Z5181 Encounter for therapeutic drug level monitoring: Secondary | ICD-10-CM | POA: Diagnosis not present

## 2018-06-17 DIAGNOSIS — I482 Chronic atrial fibrillation, unspecified: Secondary | ICD-10-CM

## 2018-06-17 LAB — POCT INR: INR: 3.4 — AB (ref 2.0–3.0)

## 2018-06-17 NOTE — Patient Instructions (Signed)
Hold coumadin tonight then resume 1/2 tablet daily except 1 tablet on Sundays, Tuesdays and Thursdays. Stopped boost.  Increase Vit K foods  Recheck in 3 weeks

## 2018-07-01 DIAGNOSIS — I4891 Unspecified atrial fibrillation: Secondary | ICD-10-CM | POA: Diagnosis not present

## 2018-07-01 DIAGNOSIS — Z7901 Long term (current) use of anticoagulants: Secondary | ICD-10-CM | POA: Diagnosis not present

## 2018-07-01 DIAGNOSIS — D509 Iron deficiency anemia, unspecified: Secondary | ICD-10-CM | POA: Diagnosis not present

## 2018-07-01 DIAGNOSIS — F17218 Nicotine dependence, cigarettes, with other nicotine-induced disorders: Secondary | ICD-10-CM | POA: Diagnosis not present

## 2018-07-01 DIAGNOSIS — E782 Mixed hyperlipidemia: Secondary | ICD-10-CM | POA: Diagnosis not present

## 2018-07-05 ENCOUNTER — Other Ambulatory Visit: Payer: Self-pay | Admitting: Thoracic Surgery (Cardiothoracic Vascular Surgery)

## 2018-07-05 ENCOUNTER — Other Ambulatory Visit: Payer: Self-pay

## 2018-07-05 DIAGNOSIS — C3492 Malignant neoplasm of unspecified part of left bronchus or lung: Secondary | ICD-10-CM

## 2018-07-06 ENCOUNTER — Ambulatory Visit (INDEPENDENT_AMBULATORY_CARE_PROVIDER_SITE_OTHER): Payer: Medicare Other | Admitting: Thoracic Surgery (Cardiothoracic Vascular Surgery)

## 2018-07-06 ENCOUNTER — Encounter: Payer: Self-pay | Admitting: Thoracic Surgery (Cardiothoracic Vascular Surgery)

## 2018-07-06 ENCOUNTER — Ambulatory Visit
Admission: RE | Admit: 2018-07-06 | Discharge: 2018-07-06 | Disposition: A | Discharge status: Home / Self Care | Payer: Medicare Other | Source: Ambulatory Visit | Attending: Thoracic Surgery (Cardiothoracic Vascular Surgery) | Admitting: Thoracic Surgery (Cardiothoracic Vascular Surgery)

## 2018-07-06 VITALS — BP 128/85 | HR 80 | Temp 97.9°F | Resp 16 | Ht 60.0 in | Wt 118.2 lb

## 2018-07-06 DIAGNOSIS — J449 Chronic obstructive pulmonary disease, unspecified: Secondary | ICD-10-CM | POA: Diagnosis not present

## 2018-07-06 DIAGNOSIS — Z902 Acquired absence of lung [part of]: Secondary | ICD-10-CM

## 2018-07-06 DIAGNOSIS — J90 Pleural effusion, not elsewhere classified: Secondary | ICD-10-CM | POA: Diagnosis not present

## 2018-07-06 DIAGNOSIS — C3492 Malignant neoplasm of unspecified part of left bronchus or lung: Secondary | ICD-10-CM

## 2018-07-06 DIAGNOSIS — C3432 Malignant neoplasm of lower lobe, left bronchus or lung: Secondary | ICD-10-CM

## 2018-07-06 NOTE — Progress Notes (Signed)
Marissa Turner       Marissa Turner,Marissa Turner             [phone]     HPI: Ms Soria returns for a scheduled follow-up visit  Marissa Turner is a 78 year old woman with a history of tobacco abuse, COPD, large cell lymphoma, hypertension, chronic atrial fibrillation, and degenerative joint disease.  She was being evaluated for a hip replacement and was found to have a left lower lobe lung mass.  She also had a stable 1 cm left upper lobe nodule.  On PET CT the lower lobe mass was hypermetabolic.  I did a thoracoscopic left lower lobectomy on 04/15/2018.  The mass turned out to be a T2, N0, stage IIA squamous cell carcinoma.  She did well postoperatively and went home on day 5.  She is doing well from the standpoint of her thoracic operation.  She continues to complain of left hip pain.  She has not smoked since prior to her surgery.  She is not having any incisional pain.  She has not had any respiratory issues.  He does complain of persistent numbness in the fourth and fifth fingers of her right hand since the surgery.  She was originally scheduled to have chemotherapy but then opted to not to take it.  Past Medical History:  Diagnosis Date  . Atrial fibrillation, chronic 2004   left atrial thrombus in 2004; near syncope in 2008; echocardiogram in 2008-normal EF, mild left atrial enlargement, no valvular abnormalities, lipomatous hypertrophy; no aortic atheroma on TEE in 2004  . Cancer (HCC)    LYMPH NODE  . Chronic anticoagulation 2004   warfarin  . Depression   . DJD (degenerative joint disease), lumbosacral    Also hip  . Dysrhythmia    chronic AFib  . Fracture of wrist    Left  . Hypotension   . Panic attacks   . Peptic ulcer disease    s/p Billroth II  . PONV (postoperative nausea and vomiting)   . Tobacco abuse    40 pack years; quit attempt in 2012    Current Outpatient Medications  Medication Sig Dispense Refill  . acetaminophen (TYLENOL) 500 MG  tablet Take 1,000 mg by mouth every 6 (six) hours as needed for moderate pain or headache.     . ALPRAZolam (XANAX) 1 MG tablet Take 1 tablet (1 mg total) by mouth 3 (three) times daily as needed for anxiety. (Patient taking differently: Take 1 mg by mouth 3 (three) times daily as needed for anxiety. ) 30 tablet 0  . CARTIA XT 240 MG 24 hr capsule     . diltiazem (CARDIZEM CD) 240 MG 24 hr capsule Take 1 capsule (240 mg total) by mouth daily. 90 capsule 3  . nicotine (NICODERM CQ - DOSED IN MG/24 HR) 7 mg/24hr patch Place 1 patch (7 mg total) onto the skin daily. 28 patch 0  . warfarin (COUMADIN) 5 MG tablet TAKE AS DIRECTED BY COUMADIN CLINIC 90 tablet 0  . calcium-vitamin D (OSCAL WITH D) 500-200 MG-UNIT tablet Take 1 tablet by mouth daily with breakfast. (Patient not taking: Reported on 07/06/2018) 60 tablet 2  . citalopram (CELEXA) 20 MG tablet TAKE ONE (1) TABLET BY MOUTH EVERY DAY (Patient not taking: No sig reported) 30 tablet 5  . ferrous sulfate 325 (65 FE) MG tablet Take 325 mg by mouth daily with breakfast.     No current facility-administered medications for this  visit.     Physical Exam BP 128/85 (BP Location: Left Arm, Patient Position: Sitting, Cuff Size: Normal)   Pulse 80   Temp 97.9 F (36.6 C) (Skin)   Resp 16   Ht 5' (1.524 m)   Wt 118 lb 3.2 oz (53.6 kg)   SpO2 95% Comment: ON RA  BMI 23.52 kg/m  78 year old woman in no acute distress Alert and oriented x3 with no focal deficits Wearing surgical mask Lungs diminished at left base, otherwise clear Cardiac regular rate and rhythm normal S1-S2 Incisions clean dry and intact  Diagnostic Tests: CHEST - 2 VIEW  COMPARISON:  Chest x-ray dated May 04, 2018.  FINDINGS: Unchanged mild cardiomegaly. Atherosclerotic calcification of the aortic arch. Normal pulmonary vascularity. The lungs remain hyperinflated with emphysematous changes. Similar-appearing postsurgical changes and volume loss in the left hemithorax  related to left lower lobectomy. Unchanged small left pleural effusion. No consolidation or pneumothorax. No acute osseous abnormality.  IMPRESSION: 1. Postsurgical changes in the left lung with unchanged small left pleural effusion. 2. COPD.   Electronically Signed   By: Titus Dubin M.D.   On: 07/06/2018 15:39 I personally reviewed the chest x-ray images and concur with the findings noted above  Impression: Marissa Turner is a 78 year old woman with a history of tobacco abuse and COPD.  She underwent a thoracoscopic left lower lobectomy and wedge resection from the left upper lobe for a stage IIa squamous cell carcinoma (T2, N0) in March 2020.  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  She is now about 3 months out from surgery.  She is not having any incisional pain.  Her respiratory status is stable.  Overall she is doing extremely well.  Stage IIA squamous cell carcinoma left lower lobe-T2, N0 lesion.  Complete resection.  Refused adjuvant chemotherapy.  We will follow-up with Dr. Delton Coombes.  Left hip pain-she continues to have severe left hip pain.  She is requesting a referral to an orthopedist in [place].  I will see if we can get her in with Dr. Pilar Plate Aluisio.  Tobacco abuse-has not smoked since her surgery.  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  Minimally symptomatic.  Likely will recover in time.  Plan: Follow-up as scheduled with Dr. Delton Coombes Referral to Dr. Gaynelle Arabian regarding left hip pain Return in 9 months for 1 year visit  Melrose Nakayama, MD [facility] [phone]

## 2018-07-07 ENCOUNTER — Telehealth: Payer: Self-pay | Admitting: *Deleted

## 2018-07-07 NOTE — Telephone Encounter (Signed)

## 2018-07-08 ENCOUNTER — Ambulatory Visit (INDEPENDENT_AMBULATORY_CARE_PROVIDER_SITE_OTHER): Payer: Medicare Other | Admitting: *Deleted

## 2018-07-08 DIAGNOSIS — I482 Chronic atrial fibrillation, unspecified: Secondary | ICD-10-CM | POA: Diagnosis not present

## 2018-07-08 DIAGNOSIS — Z5181 Encounter for therapeutic drug level monitoring: Secondary | ICD-10-CM | POA: Diagnosis not present

## 2018-07-08 LAB — POCT INR: INR: 2.1 (ref 2.0–3.0)

## 2018-07-08 NOTE — Patient Instructions (Signed)
Continue coumadin 1/2 tablet daily except 1 tablet on Sundays, Tuesdays and Thursdays. Continue Vit K foods  Recheck in 3 weeks

## 2018-07-13 DIAGNOSIS — F411 Generalized anxiety disorder: Secondary | ICD-10-CM | POA: Diagnosis not present

## 2018-07-28 ENCOUNTER — Telehealth: Payer: Self-pay | Admitting: *Deleted

## 2018-07-28 NOTE — Telephone Encounter (Signed)

## 2018-07-29 ENCOUNTER — Ambulatory Visit (INDEPENDENT_AMBULATORY_CARE_PROVIDER_SITE_OTHER): Payer: Medicare Other | Admitting: *Deleted

## 2018-07-29 ENCOUNTER — Other Ambulatory Visit: Payer: Self-pay

## 2018-07-29 DIAGNOSIS — I482 Chronic atrial fibrillation, unspecified: Secondary | ICD-10-CM | POA: Diagnosis not present

## 2018-07-29 DIAGNOSIS — Z5181 Encounter for therapeutic drug level monitoring: Secondary | ICD-10-CM

## 2018-07-29 LAB — POCT INR: INR: 2.2 (ref 2.0–3.0)

## 2018-07-29 NOTE — Patient Instructions (Signed)
Continue coumadin 1/2 tablet daily except 1 tablet on Sundays, Tuesdays and Thursdays. Continue Vit K foods  Recheck in 4 weeks

## 2018-08-17 ENCOUNTER — Other Ambulatory Visit: Payer: Self-pay | Admitting: Cardiovascular Disease

## 2018-08-17 ENCOUNTER — Ambulatory Visit (HOSPITAL_COMMUNITY): Admission: RE | Admit: 2018-08-17 | Payer: Medicare Other | Source: Ambulatory Visit

## 2018-08-19 ENCOUNTER — Inpatient Hospital Stay (HOSPITAL_COMMUNITY): Payer: Medicare Other | Admitting: Hematology

## 2018-08-27 DIAGNOSIS — M25552 Pain in left hip: Secondary | ICD-10-CM | POA: Diagnosis not present

## 2018-08-27 DIAGNOSIS — M25562 Pain in left knee: Secondary | ICD-10-CM | POA: Diagnosis not present

## 2018-09-02 ENCOUNTER — Ambulatory Visit (INDEPENDENT_AMBULATORY_CARE_PROVIDER_SITE_OTHER): Payer: Medicare Other | Admitting: *Deleted

## 2018-09-02 ENCOUNTER — Other Ambulatory Visit: Payer: Self-pay

## 2018-09-02 DIAGNOSIS — I482 Chronic atrial fibrillation, unspecified: Secondary | ICD-10-CM

## 2018-09-02 DIAGNOSIS — Z5181 Encounter for therapeutic drug level monitoring: Secondary | ICD-10-CM

## 2018-09-02 LAB — POCT INR: INR: 1.8 — AB (ref 2.0–3.0)

## 2018-09-02 NOTE — Patient Instructions (Signed)
Take coumadin 1 1/2 tablets tonight then resume 1/2 tablet daily except 1 tablet on Sundays, Tuesdays and Thursdays. Pending hip replacement 9/30 by Dr Maureen Ralphs Continue Vit K foods  Recheck in 4 weeks

## 2018-09-07 ENCOUNTER — Telehealth: Payer: Self-pay | Admitting: Cardiovascular Disease

## 2018-09-07 NOTE — Telephone Encounter (Signed)
Virtual Visit Pre-Appointment Phone Call  "(Name), I am calling you today to discuss your upcoming appointment. We are currently trying to limit exposure to the virus that causes COVID-19 by seeing patients at home rather than in the office."  1. "What is the BEST phone number to call the day of the visit?" - include this in appointment notes  2. Do you have or have access to (through a family member/friend) a smartphone with video capability that we can use for your visit?" a. If yes - list this number in appt notes as cell (if different from BEST phone #) and list the appointment type as a VIDEO visit in appointment notes b. If no - list the appointment type as a PHONE visit in appointment notes  3. Confirm consent - "In the setting of the current Covid19 crisis, you are scheduled for a (phone or video) visit with your provider on (date) at (time).  Just as we do with many in-office visits, in order for you to participate in this visit, we must obtain consent.  If you'd like, I can send this to your mychart (if signed up) or email for you to review.  Otherwise, I can obtain your verbal consent now.  All virtual visits are billed to your insurance company just like a normal visit would be.  By agreeing to a virtual visit, we'd like you to understand that the technology does not allow for your provider to perform an examination, and thus may limit your provider's ability to fully assess your condition. If your provider identifies any concerns that need to be evaluated in person, we will make arrangements to do so.  Finally, though the technology is pretty good, we cannot assure that it will always work on either your or our end, and in the setting of a video visit, we may have to convert it to a phone-only visit.  In either situation, we cannot ensure that we have a secure connection.  Are you willing to proceed?" STAFF: Did the patient verbally acknowledge consent to telehealth visit? Document  YES/NO here: Yes  4. Advise patient to be prepared - "Two hours prior to your appointment, go ahead and check your blood pressure, pulse, oxygen saturation, and your weight (if you have the equipment to check those) and write them all down. When your visit starts, your provider will ask you for this information. If you have an Apple Watch or Kardia device, please plan to have heart rate information ready on the day of your appointment. Please have a pen and paper handy nearby the day of the visit as well."  5. Give patient instructions for MyChart download to smartphone OR [email or web address] as below if video visit (depending on what platform provider is using)  6. Inform patient they will receive a phone call 15 minutes prior to their appointment time (may be from unknown caller ID) so they should be prepared to answer    TELEPHONE CALL NOTE  Marissa Turner has been deemed a candidate for a follow-up tele-health visit to limit community exposure during the Covid-19 pandemic. I spoke with the patient via phone to ensure availability of phone/video source, confirm preferred email & phone number, and discuss instructions and expectations.  I reminded Marissa Turner to be prepared with any vital sign and/or heart rhythm information that could potentially be obtained via home monitoring, at the time of her visit. I reminded Marissa Turner to expect a phone call prior to  her visit.  Terry L Goins 09/07/2018 2:00 PM

## 2018-09-15 ENCOUNTER — Encounter: Payer: Self-pay | Admitting: Cardiovascular Disease

## 2018-09-15 ENCOUNTER — Telehealth (INDEPENDENT_AMBULATORY_CARE_PROVIDER_SITE_OTHER): Payer: Medicare Other | Admitting: Cardiovascular Disease

## 2018-09-15 ENCOUNTER — Telehealth: Payer: Self-pay | Admitting: Licensed Clinical Social Worker

## 2018-09-15 VITALS — Ht 60.0 in | Wt 115.0 lb

## 2018-09-15 DIAGNOSIS — I10 Essential (primary) hypertension: Secondary | ICD-10-CM

## 2018-09-15 DIAGNOSIS — Z7901 Long term (current) use of anticoagulants: Secondary | ICD-10-CM

## 2018-09-15 DIAGNOSIS — I4821 Permanent atrial fibrillation: Secondary | ICD-10-CM

## 2018-09-15 DIAGNOSIS — R9439 Abnormal result of other cardiovascular function study: Secondary | ICD-10-CM

## 2018-09-15 NOTE — Progress Notes (Signed)
Virtual Visit via Telephone Note   This visit type was conducted due to national recommendations for restrictions regarding the COVID-19 Pandemic (e.g. social distancing) in an effort to limit this patient's exposure and mitigate transmission in our community.  Due to her co-morbid illnesses, this patient is at least at moderate risk for complications without adequate follow up.  This format is felt to be most appropriate for this patient at this time.  The patient did not have access to video technology/had technical difficulties with video requiring transitioning to audio format only (telephone).  All issues noted in this document were discussed and addressed.  No physical exam could be performed with this format.  Please refer to the patient's chart for her  consent to telehealth for [facility].   Date:  09/15/2018   ID:  Marissa, Turner 01/17/1941, MRN [id number]  Patient Location: Home Provider Location: Home  PCP:  Celene Squibb, MD  Cardiologist:  Kate Sable, MD  Electrophysiologist:  None   Evaluation Performed:  Follow-Up Visit  Chief Complaint: Atrial fibrillation  History of Present Illness:    Marissa Turner is a 78 y.o. female with permanent atrial fibrillation.  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  She plans to undergo surgery in the near future.  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  She did not want chemotherapy.  She denies chest pain and palpitations.  She walks with a walker and is able to carry out her activities of daily living.  She has a strong faith.  She denies fevers.   Past Medical History:  Diagnosis Date  . Atrial fibrillation, chronic 2004   left atrial thrombus in 2004; near syncope in 2008; echocardiogram in 2008-normal EF, mild left atrial enlargement, no valvular abnormalities, lipomatous hypertrophy; no aortic atheroma on TEE in  2004  . Cancer (HCC)    LYMPH NODE  . Chronic anticoagulation 2004   warfarin  . Depression   . DJD (degenerative joint disease), lumbosacral    Also hip  . Dysrhythmia    chronic AFib  . Fracture of wrist    Left  . Hypotension   . Panic attacks   . Peptic ulcer disease    s/p Billroth II  . PONV (postoperative nausea and vomiting)   . Tobacco abuse    40 pack years; quit attempt in 2012   Past Surgical History:  Procedure Laterality Date  . ABDOMINAL HYSTERECTOMY     w/o oophorectomy  . APPENDECTOMY    . CESAREAN SECTION    . CHOLECYSTECTOMY  2004   [place]   patient denies ever having undergone colonoscopy as of 2014  . GASTROJEJUNOSTOMY  1984   Billroth II; splenectomy; incidental appendectomy  . KNEE ARTHROSCOPY WITH MEDIAL MENISECTOMY Right 02/26/2015   Procedure: KNEE ARTHROSCOPY WITH MEDIAL MENISECTOMY;  Surgeon: Sanjuana Kava, MD;  Location: [facility];  Service: Orthopedics;  Laterality: Right;  . LYMPH NODE BIOPSY Left 09/15/2013   Procedure: LEFT NECK CERVICAL NODE BIOPSY;  Surgeon: Scherry Ran, MD;  Location: [facility];  Service: General;  Laterality: Left;  . PORT-A-CATH REMOVAL Right 07/12/2014   Procedure: REMOVAL PORT-A-CATH;  Surgeon: Aviva Signs Md, MD;  Location: [facility];  Service: General;  Laterality: Right;  . PORTACATH PLACEMENT Right 10/21/2013   Procedure: ATTEMPTED INSERTION PORT-A-CATH;  Surgeon: Scherry Ran, MD;  Location: [facility];  Service: General;  Laterality: Right;  . SPLENECTOMY, TOTAL    . THORACOSCOPY Left 04/15/2018   VIDEO ASSISTED THORACOSCOPY (VATS)/LEFT LOWER LOBECTOMY (Left)  . TONSILLECTOMY    . VIDEO ASSISTED THORACOSCOPY (VATS)/ LOBECTOMY Left 04/15/2018   Procedure: VIDEO ASSISTED THORACOSCOPY (VATS)/LEFT LOWER LOBECTOMY;  Surgeon: Melrose Nakayama, MD;  Location: [facility];  Service: Thoracic;  Laterality: Left;  . WEDGE RESECTION Left 04/15/2018   Procedure: LEFT UPPER WEDGE RESECTION;   Surgeon: Melrose Nakayama, MD;  Location: [facility];  Service: Thoracic;  Laterality: Left;     Current Meds  Medication Sig  . acetaminophen (TYLENOL) 500 MG tablet Take 1,000 mg by mouth every 6 (six) hours as needed for moderate pain or headache.   . ALPRAZolam (XANAX) 1 MG tablet Take 1 tablet (1 mg total) by mouth 3 (three) times daily as needed for anxiety. (Patient taking differently: Take 1 mg by mouth 3 (three) times daily as needed for anxiety. )  . warfarin (COUMADIN) 5 MG tablet Take 1/2 tablet daily except 1 tablet on Sundays, Tuesdays and Thursdays  . [DISCONTINUED] calcium-vitamin D (OSCAL WITH D) 500-200 MG-UNIT tablet Take 1 tablet by mouth daily with breakfast.  . [DISCONTINUED] CARTIA XT 240 MG 24 hr capsule Take by mouth daily.      Allergies:   Estrogens, Codeine, Penicillins, and Diphenhydramine hcl   Social History   Tobacco Use  . Smoking status: Current Every Day Smoker    Packs/day: 0.50    Years: 55.00    Pack years: 27.50    Types: Cigarettes  . Smokeless tobacco: Never Used  . Tobacco comment: 2-3 cigarettes a day  Substance Use Topics  . Alcohol use: No  . Drug use: No     Family Hx: The patient's family history includes Breast cancer in her sister and sister; Cancer in her brother; Cirrhosis in her sister; Heart attack in her brother and mother.  ROS:   Please see the history of present illness.     All other systems reviewed and are negative.   Prior CV studies:   The following studies were reviewed today:  Nuclear stress test 01/12/2018:   There was no ST segment deviation noted during stress.  This is a low risk study.  The left ventricular ejection fraction is normal (55-65%).  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 This area may be affected by artifact due to adjacent gut radiotracer uptake. Overall findings are low risk  Labs/Other Tests and Data Reviewed:    EKG:   Atrial fibrillation, 64 bpm (04/13/18)  Recent Labs: 04/17/2018: ALT 190 04/19/2018: BUN 10; Creatinine, Ser 0.64; Hemoglobin 9.6; Platelets 247; Potassium 3.4; Sodium 137   Recent Lipid Panel Lab Results  Component Value Date/Time   CHOL 179 06/30/2013 08:40 AM   TRIG 173 (H) 06/30/2013 08:40 AM   HDL 37 (L) 06/30/2013 08:40 AM   CHOLHDL 4.8 06/30/2013 08:40 AM   LDLCALC 107 (H) 06/30/2013 08:40 AM    Wt Readings from Last 3 Encounters:  09/15/18 115 lb (52.2 kg)  07/06/18 118 lb 3.2 oz (53.6 kg)  05/11/18 107 lb 11.2 oz (48.9 kg)     Objective:    Vital Signs:  Ht 5' (1.524 m)   Wt 115 lb (52.2 kg)   BMI 22.46 kg/m    VITAL SIGNS:  reviewed  ASSESSMENT & PLAN:    1.  Permanent atrial fibrillation: Symptomatically stable.  Continue Cardizem CD 240  mg daily.  Anticoagulated with warfarin.  2.  Hypertension: No changes to therapy.  3.  Abnormal nuclear stress test: She does not endorse anginal symptoms.  Nuclear stress test was low risk overall and may have been affected by adjacent GI uptake of the radiotracer.  No further investigations are needed at this time.  LVEF is normal.    COVID-19 Education: The signs and symptoms of COVID-19 were discussed with the patient and how to seek care for testing (follow up with PCP or arrange E-visit).  The importance of social distancing was discussed today.  Time:   Today, I have spent 10 minutes with the patient with telehealth technology discussing the above problems.     Medication Adjustments/Labs and Tests Ordered: Current medicines are reviewed at length with the patient today.  Concerns regarding medicines are outlined above.   Tests Ordered: No orders of the defined types were placed in this encounter.   Medication Changes: No orders of the defined types were placed in this encounter.   Follow Up:  Virtual Visit or In Person in 1 year(s)  Signed, Kate Sable, MD  09/15/2018 9:17 AM    [place]

## 2018-09-15 NOTE — Telephone Encounter (Signed)
CSW referred to assist patient with obtaining a BP cuff. CSW contacted patient to inform cuff will be delivered to home. Patient grateful for support and assistance. CSW available as needed. Jackie Corynn Solberg, LCSW, CCSW-MCS [phone]  

## 2018-09-15 NOTE — Patient Instructions (Signed)
Medication Instructions: Your physician recommends that you continue on your current medications as directed. Please refer to the Current Medication list given to you today.   Labwork: None today  Procedures/Testing: None today  Follow-Up: 1 year with Dr.Koneswaran  Any Additional Special Instructions Will Be Listed Below (If Applicable).     If you need a refill on your cardiac medications before your next appointment, please call your pharmacy.     Thank you for choosing [facility] !

## 2018-09-16 DIAGNOSIS — R600 Localized edema: Secondary | ICD-10-CM | POA: Diagnosis not present

## 2018-09-16 DIAGNOSIS — M1990 Unspecified osteoarthritis, unspecified site: Secondary | ICD-10-CM | POA: Diagnosis not present

## 2018-09-16 DIAGNOSIS — M25552 Pain in left hip: Secondary | ICD-10-CM | POA: Diagnosis not present

## 2018-09-16 DIAGNOSIS — I482 Chronic atrial fibrillation, unspecified: Secondary | ICD-10-CM | POA: Diagnosis not present

## 2018-09-23 DIAGNOSIS — I10 Essential (primary) hypertension: Secondary | ICD-10-CM | POA: Diagnosis not present

## 2018-09-23 DIAGNOSIS — M1990 Unspecified osteoarthritis, unspecified site: Secondary | ICD-10-CM | POA: Diagnosis not present

## 2018-09-23 DIAGNOSIS — D509 Iron deficiency anemia, unspecified: Secondary | ICD-10-CM | POA: Diagnosis not present

## 2018-09-23 DIAGNOSIS — R7301 Impaired fasting glucose: Secondary | ICD-10-CM | POA: Diagnosis not present

## 2018-09-23 DIAGNOSIS — I482 Chronic atrial fibrillation, unspecified: Secondary | ICD-10-CM | POA: Diagnosis not present

## 2018-09-23 DIAGNOSIS — Z7901 Long term (current) use of anticoagulants: Secondary | ICD-10-CM | POA: Diagnosis not present

## 2018-09-23 DIAGNOSIS — F411 Generalized anxiety disorder: Secondary | ICD-10-CM | POA: Diagnosis not present

## 2018-09-23 DIAGNOSIS — F17211 Nicotine dependence, cigarettes, in remission: Secondary | ICD-10-CM | POA: Diagnosis not present

## 2018-09-23 DIAGNOSIS — Z720 Tobacco use: Secondary | ICD-10-CM | POA: Diagnosis not present

## 2018-09-29 ENCOUNTER — Ambulatory Visit (INDEPENDENT_AMBULATORY_CARE_PROVIDER_SITE_OTHER): Payer: Medicare Other | Admitting: *Deleted

## 2018-09-29 ENCOUNTER — Other Ambulatory Visit: Payer: Self-pay

## 2018-09-29 DIAGNOSIS — I482 Chronic atrial fibrillation, unspecified: Secondary | ICD-10-CM

## 2018-09-29 DIAGNOSIS — Z5181 Encounter for therapeutic drug level monitoring: Secondary | ICD-10-CM

## 2018-09-29 LAB — POCT INR: INR: 1.8 — AB (ref 2.0–3.0)

## 2018-09-29 NOTE — Patient Instructions (Signed)
Take coumadin 1 tablet tonight then increase dose to 1 tablet daily except 1/2 tablet on Mondays, Wednesdays and Fridays. Pending hip replacement by Dr Maureen Ralphs.  Not scheduled yst.  Has appt to discuss on 9/25 Continue Vit K foods  Recheck in 4 weeks

## 2018-10-12 DIAGNOSIS — R7301 Impaired fasting glucose: Secondary | ICD-10-CM | POA: Diagnosis not present

## 2018-10-12 DIAGNOSIS — D509 Iron deficiency anemia, unspecified: Secondary | ICD-10-CM | POA: Diagnosis not present

## 2018-10-12 DIAGNOSIS — F17211 Nicotine dependence, cigarettes, in remission: Secondary | ICD-10-CM | POA: Diagnosis not present

## 2018-10-12 DIAGNOSIS — I10 Essential (primary) hypertension: Secondary | ICD-10-CM | POA: Diagnosis not present

## 2018-10-12 DIAGNOSIS — Z7901 Long term (current) use of anticoagulants: Secondary | ICD-10-CM | POA: Diagnosis not present

## 2018-10-12 DIAGNOSIS — I482 Chronic atrial fibrillation, unspecified: Secondary | ICD-10-CM | POA: Diagnosis not present

## 2018-10-12 DIAGNOSIS — M1990 Unspecified osteoarthritis, unspecified site: Secondary | ICD-10-CM | POA: Diagnosis not present

## 2018-10-12 DIAGNOSIS — F411 Generalized anxiety disorder: Secondary | ICD-10-CM | POA: Diagnosis not present

## 2018-10-12 DIAGNOSIS — Z720 Tobacco use: Secondary | ICD-10-CM | POA: Diagnosis not present

## 2018-10-14 ENCOUNTER — Other Ambulatory Visit: Payer: Self-pay

## 2018-10-14 ENCOUNTER — Other Ambulatory Visit (HOSPITAL_COMMUNITY): Payer: Medicare Other

## 2018-10-14 ENCOUNTER — Other Ambulatory Visit (HOSPITAL_COMMUNITY)
Admission: RE | Admit: 2018-10-14 | Discharge: 2018-10-14 | Disposition: A | Discharge status: Home / Self Care | Payer: Medicare Other | Source: Ambulatory Visit | Attending: Orthopedic Surgery | Admitting: Orthopedic Surgery

## 2018-10-14 DIAGNOSIS — Z01812 Encounter for preprocedural laboratory examination: Secondary | ICD-10-CM | POA: Diagnosis not present

## 2018-10-14 DIAGNOSIS — Z20828 Contact with and (suspected) exposure to other viral communicable diseases: Secondary | ICD-10-CM | POA: Insufficient documentation

## 2018-10-14 LAB — SARS CORONAVIRUS 2 (TAT 6-24 HRS): SARS Coronavirus 2: NEGATIVE

## 2018-10-14 NOTE — Progress Notes (Signed)
PCP - Allyn Kenner   [facility]) Clearance 09-23-18 on chart LOV 09-16-18 on chart Cardiologist - Dr. Bronson Ing LOV  09-15-18 EPIC  PPM/ICD -  Device Orders -  Rep Notified  Chest x-ray - 07-06-18 epic EKG - 04-13-18 epic Stress Test -  ECHO -  Cardiac Cath -   Sleep Study -  CPAP -   Fasting Blood Sugar -  Checks Blood Sugar _____ times a day  Blood Thinner Instructions:on chart stop 5 days prior Coumadin Aspirin Instructions:  ERAS Protcol -yes PRE-SURGERY Ensure - yes  COVID TEST- 10-14-18  neg   Anesthesia review: blood thinner, vats LLL lobectomy 04-15-18  Patient denies shortness of breath, fever, cough and chest pain at PAT appointment  NONE   Patient verbalized understanding of instructions that were given to them at the PAT appointment. Patient was also instructed that they will need to review over the PAT instructions again at home before surgery.

## 2018-10-14 NOTE — Patient Instructions (Signed)
DUE TO COVID-19 ONLY ONE VISITOR IS ALLOWED TO COME WITH YOU AND STAY IN THE WAITING ROOM ONLY DURING PRE OP AND PROCEDURE DAY OF SURGERY. THE 1 VISITOR MAY VISIT WITH YOU AFTER SURGERY IN YOUR PRIVATE ROOM DURING VISITING HOURS ONLY!  YOU NEED TO HAVE A COVID 19 TEST ON_______ @_______ , THIS TEST MUST BE DONE BEFORE SURGERY, COME  [address], [address] , [address].  ([place]) ONCE YOUR COVID TEST IS COMPLETED, PLEASE BEGIN THE QUARANTINE INSTRUCTIONS AS OUTLINED IN YOUR HANDOUT.                MERIT GADSBY  10/14/2018   Your procedure is scheduled on: 10-18-18   Report to [facility] Main  Entrance   Report to admitting at          1100 AM     Call this number if you have problems the morning of surgery [phone]    Remember: NO SOLID FOOD AFTER MIDNIGHT THE NIGHT PRIOR TO SURGERY. NOTHING BY MOUTH EXCEPT CLEAR LIQUIDS UNTIL     1030 am . PLEASE FINISH ENSURE DRINK PER SURGEON ORDER  WHICH NEEDS TO BE COMPLETED AT    1030 am then nothing by mouth .    CLEAR LIQUID DIET   Foods Allowed                                                                     Foods Excluded  Coffee and tea, regular and decaf                             liquids that you cannot  Plain Jell-O any favor except red or purple                                           see through such as: Fruit ices (not with fruit pulp)                                     milk, soups, orange juice  Iced Popsicles                                    All solid food Carbonated beverages, regular and diet                                    Cranberry, grape and apple juices Sports drinks like Gatorade Lightly seasoned clear broth or consume(fat free) Sugar, honey syrup   _____________________________________________________________________    . BRUSH YOUR TEETH MORNING OF SURGERY AND RINSE YOUR MOUTH OUT, NO CHEWING GUM CANDY OR MINTS.     Take these medicines the morning of surgery  with A SIP OF WATER: diltiazem, celexa  You may not have any metal on your body including hair pins and              piercings  Do not wear jewelry, make-up, lotions, powders or perfumes, deodorant             Do not wear nail polish.  Do not shave  48 hours prior to surgery.          Do not bring valuables to the hospital. Brush.  Contacts, dentures or bridgework may not be worn into surgery.                   Please read over the following fact sheets you were given: _____________________________________________________________________             [facility] - Preparing for Surgery Before surgery, you can play an important role.  Because skin is not sterile, your skin needs to be as free of germs as possible.  You can reduce the number of germs on your skin by washing with CHG (chlorahexidine gluconate) soap before surgery.  CHG is an antiseptic cleaner which kills germs and bonds with the skin to continue killing germs even after washing. Please DO NOT use if you have an allergy to CHG or antibacterial soaps.  If your skin becomes reddened/irritated stop using the CHG and inform your nurse when you arrive at Short Stay. Do not shave (including legs and underarms) for at least 48 hours prior to the first CHG shower.  You may shave your face/neck. Please follow these instructions carefully:  1.  Shower with CHG Soap the night before surgery and the  morning of Surgery.  2.  If you choose to wash your hair, wash your hair first as usual with your  normal  shampoo.  3.  After you shampoo, rinse your hair and body thoroughly to remove the  shampoo.                           4.  Use CHG as you would any other liquid soap.  You can apply chg directly  to the skin and wash                       Gently with a scrungie or clean washcloth.  5.  Apply the CHG Soap to your body ONLY FROM THE NECK DOWN.   Do not  use on face/ open                           Wound or open sores. Avoid contact with eyes, ears mouth and genitals (private parts).                       Wash face,  Genitals (private parts) with your normal soap.             6.  Wash thoroughly, paying special attention to the area where your surgery  will be performed.  7.  Thoroughly rinse your body with warm water from the neck down.  8.  DO NOT shower/wash with your normal soap after using and rinsing off  the CHG Soap.                9.  Pat yourself  dry with a clean towel.            10.  Wear clean pajamas.            11.  Place clean sheets on your bed the night of your first shower and do not  sleep with pets. Day of Surgery : Do not apply any lotions/deodorants the morning of surgery.  Please wear clean clothes to the hospital/surgery center.  FAILURE TO FOLLOW THESE INSTRUCTIONS MAY RESULT IN THE CANCELLATION OF YOUR SURGERY PATIENT SIGNATURE_________________________________  NURSE SIGNATURE__________________________________  ________________________________________________________________________   Adam Phenix  An incentive spirometer is a tool that can help keep your lungs clear and active. This tool measures how well you are filling your lungs with each breath. Taking long deep breaths may help reverse or decrease the chance of developing breathing (pulmonary) problems (especially infection) following:  A long period of time when you are unable to move or be active. BEFORE THE PROCEDURE   If the spirometer includes an indicator to show your best effort, your nurse or respiratory therapist will set it to a desired goal.  If possible, sit up straight or lean slightly forward. Try not to slouch.  Hold the incentive spirometer in an upright position. INSTRUCTIONS FOR USE  1. Sit on the edge of your bed if possible, or sit up as far as you can in bed or on a chair. 2. Hold the incentive spirometer in an upright  position. 3. Breathe out normally. 4. Place the mouthpiece in your mouth and seal your lips tightly around it. 5. Breathe in slowly and as deeply as possible, raising the piston or the ball toward the top of the column. 6. Hold your breath for 3-5 seconds or for as long as possible. Allow the piston or ball to fall to the bottom of the column. 7. Remove the mouthpiece from your mouth and breathe out normally. 8. Rest for a few seconds and repeat Steps 1 through 7 at least 10 times every 1-2 hours when you are awake. Take your time and take a few normal breaths between deep breaths. 9. The spirometer may include an indicator to show your best effort. Use the indicator as a goal to work toward during each repetition. 10. After each set of 10 deep breaths, practice coughing to be sure your lungs are clear. If you have an incision (the cut made at the time of surgery), support your incision when coughing by placing a pillow or rolled up towels firmly against it. Once you are able to get out of bed, walk around indoors and cough well. You may stop using the incentive spirometer when instructed by your caregiver.  RISKS AND COMPLICATIONS  Take your time so you do not get dizzy or light-headed.  If you are in pain, you may need to take or ask for pain medication before doing incentive spirometry. It is harder to take a deep breath if you are having pain. AFTER USE  Rest and breathe slowly and easily.  It can be helpful to keep track of a log of your progress. Your caregiver can provide you with a simple table to help with this. If you are using the spirometer at home, follow these instructions: [place] IF:   You are having difficultly using the spirometer.  You have trouble using the spirometer as often as instructed.  Your pain medication is not giving enough relief while using the spirometer.  You develop fever of 100.5 F (  38.1 C) or higher. SEEK IMMEDIATE MEDICAL CARE IF:    You cough up bloody sputum that had not been present before.  You develop fever of 102 F (38.9 C) or greater.  You develop worsening pain at or near the incision site. MAKE SURE YOU:   Understand these instructions.  Will watch your condition.  Will get help right away if you are not doing well or get worse. Document Released: 05/26/2006 Document Revised: 04/07/2011 Document Reviewed: 07/27/2006 ExitCare Patient Information 2014 ExitCare, [place].   ________________________________________________________________________  WHAT IS A BLOOD TRANSFUSION? Blood Transfusion Information  A transfusion is the replacement of blood or some of its parts. Blood is made up of multiple cells which provide different functions.  Red blood cells carry oxygen and are used for blood loss replacement.  White blood cells fight against infection.  Platelets control bleeding.  Plasma helps clot blood.  Other blood products are available for specialized needs, such as hemophilia or other clotting disorders. BEFORE THE TRANSFUSION  Who gives blood for transfusions?   Healthy volunteers who are fully evaluated to make sure their blood is safe. This is blood bank blood. Transfusion therapy is the safest it has ever been in the practice of medicine. Before blood is taken from a donor, a complete history is taken to make sure that person has no history of diseases nor engages in risky social behavior (examples are intravenous drug use or sexual activity with multiple partners). The donor's travel history is screened to minimize risk of transmitting infections, such as malaria. The donated blood is tested for signs of infectious diseases, such as HIV and hepatitis. The blood is then tested to be sure it is compatible with you in order to minimize the chance of a transfusion reaction. If you or a relative donates blood, this is often done in anticipation of surgery and is not appropriate for emergency  situations. It takes many days to process the donated blood. RISKS AND COMPLICATIONS Although transfusion therapy is very safe and saves many lives, the main dangers of transfusion include:   Getting an infectious disease.  Developing a transfusion reaction. This is an allergic reaction to something in the blood you were given. Every precaution is taken to prevent this. The decision to have a blood transfusion has been considered carefully by your caregiver before blood is given. Blood is not given unless the benefits outweigh the risks. AFTER THE TRANSFUSION  Right after receiving a blood transfusion, you will usually feel much better and more energetic. This is especially true if your red blood cells have gotten low (anemic). The transfusion raises the level of the red blood cells which carry oxygen, and this usually causes an energy increase.  The nurse administering the transfusion will monitor you carefully for complications. HOME CARE INSTRUCTIONS  No special instructions are needed after a transfusion. You may find your energy is better. Speak with your caregiver about any limitations on activity for underlying diseases you may have. SEEK MEDICAL CARE IF:   Your condition is not improving after your transfusion.  You develop redness or irritation at the intravenous (IV) site. SEEK IMMEDIATE MEDICAL CARE IF:  Any of the following symptoms occur over the next 12 hours:  Shaking chills.  You have a temperature by mouth above 102 F (38.9 C), not controlled by medicine.  Chest, back, or muscle pain.  People around you feel you are not acting correctly or are confused.  Shortness of breath or difficulty breathing.  Dizziness and fainting.  You get a rash or develop hives.  You have a decrease in urine output.  Your urine turns a dark color or changes to pink, red, or brown. Any of the following symptoms occur over the next 10 days:  You have a temperature by mouth above  102 F (38.9 C), not controlled by medicine.  Shortness of breath.  Weakness after normal activity.  The white part of the eye turns yellow (jaundice).  You have a decrease in the amount of urine or are urinating less often.  Your urine turns a dark color or changes to pink, red, or brown. Document Released: 01/11/2000 Document Revised: 04/07/2011 Document Reviewed: 08/30/2007 [facility] Patient Information 2014 [place], [place].  _______________________________________________________________________

## 2018-10-15 ENCOUNTER — Other Ambulatory Visit: Payer: Self-pay

## 2018-10-15 ENCOUNTER — Encounter (HOSPITAL_COMMUNITY)
Admission: RE | Admit: 2018-10-15 | Discharge: 2018-10-15 | Disposition: A | Discharge status: Home / Self Care | Payer: Medicare Other | Source: Ambulatory Visit | Attending: Orthopedic Surgery | Admitting: Orthopedic Surgery

## 2018-10-15 ENCOUNTER — Encounter (HOSPITAL_COMMUNITY): Payer: Self-pay

## 2018-10-15 DIAGNOSIS — Z01812 Encounter for preprocedural laboratory examination: Secondary | ICD-10-CM | POA: Insufficient documentation

## 2018-10-15 DIAGNOSIS — M1612 Unilateral primary osteoarthritis, left hip: Secondary | ICD-10-CM | POA: Diagnosis not present

## 2018-10-15 HISTORY — DX: Chronic obstructive pulmonary disease, unspecified: J44.9

## 2018-10-15 HISTORY — DX: Personal history of urinary calculi: Z87.442

## 2018-10-15 HISTORY — DX: Family history of other specified conditions: Z84.89

## 2018-10-15 LAB — CBC
HCT: 41.7 % (ref 36.0–46.0)
Hemoglobin: 12.7 g/dL (ref 12.0–15.0)
MCH: 26.3 pg (ref 26.0–34.0)
MCHC: 30.5 g/dL (ref 30.0–36.0)
MCV: 86.3 fL (ref 80.0–100.0)
Platelets: 426 10*3/uL — ABNORMAL HIGH (ref 150–400)
RBC: 4.83 MIL/uL (ref 3.87–5.11)
RDW: 15.8 % — ABNORMAL HIGH (ref 11.5–15.5)
WBC: 11.3 10*3/uL — ABNORMAL HIGH (ref 4.0–10.5)
nRBC: 0 % (ref 0.0–0.2)

## 2018-10-15 LAB — SURGICAL PCR SCREEN
MRSA, PCR: NEGATIVE
Staphylococcus aureus: NEGATIVE

## 2018-10-15 LAB — COMPREHENSIVE METABOLIC PANEL
ALT: 16 U/L (ref 0–44)
AST: 24 U/L (ref 15–41)
Albumin: 4.1 g/dL (ref 3.5–5.0)
Alkaline Phosphatase: 81 U/L (ref 38–126)
Anion gap: 8 (ref 5–15)
BUN: 19 mg/dL (ref 8–23)
CO2: 30 mmol/L (ref 22–32)
Calcium: 8.9 mg/dL (ref 8.9–10.3)
Chloride: 103 mmol/L (ref 98–111)
Creatinine, Ser: 0.68 mg/dL (ref 0.44–1.00)
GFR calc Af Amer: >60 mL/min (ref >60)
GFR calc non Af Amer: >60 mL/min (ref >60)
Glucose, Bld: 100 mg/dL — ABNORMAL HIGH (ref 70–99)
Potassium: 4.4 mmol/L (ref 3.5–5.1)
Sodium: 141 mmol/L (ref 135–145)
Total Bilirubin: 0.4 mg/dL (ref 0.3–1.2)
Total Protein: 7.6 g/dL (ref 6.5–8.1)

## 2018-10-15 LAB — APTT: aPTT: 30 seconds (ref 24–36)

## 2018-10-15 LAB — PROTIME-INR
INR: 1.2 (ref 0.8–1.2)
Prothrombin Time: 15.2 seconds (ref 11.4–15.2)

## 2018-10-15 LAB — ABO/RH: ABO/RH(D): A POS

## 2018-10-18 ENCOUNTER — Inpatient Hospital Stay (HOSPITAL_COMMUNITY): Payer: Medicare Other

## 2018-10-18 ENCOUNTER — Observation Stay (HOSPITAL_COMMUNITY)
Admission: RE | Admit: 2018-10-18 | Discharge: 2018-10-19 | Disposition: A | Discharge status: Home Health | Payer: Medicare Other | Attending: Orthopedic Surgery | Admitting: Orthopedic Surgery

## 2018-10-18 ENCOUNTER — Encounter (HOSPITAL_COMMUNITY)
Admission: RE | Disposition: A | Discharge status: Home Health | Payer: Self-pay | Source: Home / Self Care | Attending: Orthopedic Surgery

## 2018-10-18 ENCOUNTER — Inpatient Hospital Stay (HOSPITAL_COMMUNITY): Payer: Medicare Other | Admitting: Certified Registered"

## 2018-10-18 ENCOUNTER — Encounter (HOSPITAL_COMMUNITY): Payer: Self-pay

## 2018-10-18 DIAGNOSIS — I482 Chronic atrial fibrillation, unspecified: Secondary | ICD-10-CM | POA: Insufficient documentation

## 2018-10-18 DIAGNOSIS — Z8572 Personal history of non-Hodgkin lymphomas: Secondary | ICD-10-CM | POA: Insufficient documentation

## 2018-10-18 DIAGNOSIS — Z471 Aftercare following joint replacement surgery: Secondary | ICD-10-CM | POA: Diagnosis not present

## 2018-10-18 DIAGNOSIS — Z85118 Personal history of other malignant neoplasm of bronchus and lung: Secondary | ICD-10-CM | POA: Diagnosis not present

## 2018-10-18 DIAGNOSIS — Z902 Acquired absence of lung [part of]: Secondary | ICD-10-CM | POA: Diagnosis not present

## 2018-10-18 DIAGNOSIS — F419 Anxiety disorder, unspecified: Secondary | ICD-10-CM | POA: Diagnosis not present

## 2018-10-18 DIAGNOSIS — Z96642 Presence of left artificial hip joint: Secondary | ICD-10-CM | POA: Diagnosis not present

## 2018-10-18 DIAGNOSIS — Z7901 Long term (current) use of anticoagulants: Secondary | ICD-10-CM | POA: Insufficient documentation

## 2018-10-18 DIAGNOSIS — F329 Major depressive disorder, single episode, unspecified: Secondary | ICD-10-CM | POA: Insufficient documentation

## 2018-10-18 DIAGNOSIS — I10 Essential (primary) hypertension: Secondary | ICD-10-CM | POA: Diagnosis not present

## 2018-10-18 DIAGNOSIS — Z885 Allergy status to narcotic agent status: Secondary | ICD-10-CM | POA: Insufficient documentation

## 2018-10-18 DIAGNOSIS — D509 Iron deficiency anemia, unspecified: Secondary | ICD-10-CM | POA: Diagnosis not present

## 2018-10-18 DIAGNOSIS — Z419 Encounter for procedure for purposes other than remedying health state, unspecified: Secondary | ICD-10-CM

## 2018-10-18 DIAGNOSIS — J449 Chronic obstructive pulmonary disease, unspecified: Secondary | ICD-10-CM | POA: Insufficient documentation

## 2018-10-18 DIAGNOSIS — M1612 Unilateral primary osteoarthritis, left hip: Secondary | ICD-10-CM | POA: Diagnosis not present

## 2018-10-18 DIAGNOSIS — Z87891 Personal history of nicotine dependence: Secondary | ICD-10-CM | POA: Diagnosis not present

## 2018-10-18 DIAGNOSIS — Z888 Allergy status to other drugs, medicaments and biological substances status: Secondary | ICD-10-CM | POA: Diagnosis not present

## 2018-10-18 DIAGNOSIS — Z880 Allergy status to penicillin: Secondary | ICD-10-CM | POA: Insufficient documentation

## 2018-10-18 DIAGNOSIS — M169 Osteoarthritis of hip, unspecified: Secondary | ICD-10-CM

## 2018-10-18 DIAGNOSIS — Z96649 Presence of unspecified artificial hip joint: Secondary | ICD-10-CM

## 2018-10-18 HISTORY — PX: TOTAL HIP ARTHROPLASTY: SHX124

## 2018-10-18 LAB — TYPE AND SCREEN
ABO/RH(D): A POS
Antibody Screen: NEGATIVE

## 2018-10-18 SURGERY — ARTHROPLASTY, HIP, TOTAL, ANTERIOR APPROACH
Anesthesia: General | Site: Hip | Laterality: Left

## 2018-10-18 MED ORDER — FENTANYL CITRATE (PF) 100 MCG/2ML IJ SOLN
INTRAMUSCULAR | Status: AC
  Filled 2018-10-18: qty 4

## 2018-10-18 MED ORDER — OXYCODONE HCL 5 MG PO TABS: 5.0000 mg | ORAL_TABLET | Freq: Once | ORAL | Status: DC | PRN

## 2018-10-18 MED ORDER — STERILE WATER FOR IRRIGATION IR SOLN
Status: DC | PRN
  Administered 2018-10-18: 2000 mL

## 2018-10-18 MED ORDER — WARFARIN - PHARMACIST DOSING INPATIENT: Freq: Every day | Status: DC

## 2018-10-18 MED ORDER — 0.9 % SODIUM CHLORIDE (POUR BTL) OPTIME
TOPICAL | Status: DC | PRN
  Administered 2018-10-18: 14:00:00 1000 mL

## 2018-10-18 MED ORDER — ONDANSETRON HCL 4 MG/2ML IJ SOLN
INTRAMUSCULAR | Status: AC
  Filled 2018-10-18: qty 2

## 2018-10-18 MED ORDER — SUCCINYLCHOLINE CHLORIDE 200 MG/10ML IV SOSY
PREFILLED_SYRINGE | INTRAVENOUS | Status: AC
  Filled 2018-10-18: qty 10

## 2018-10-18 MED ORDER — METOPROLOL TARTRATE 5 MG/5ML IV SOLN
2.0000 mg | Freq: Once | INTRAVENOUS | Status: AC
  Administered 2018-10-18: 17:00:00 2 mg via INTRAVENOUS

## 2018-10-18 MED ORDER — POLYETHYLENE GLYCOL 3350 17 G PO PACK: 17.0000 g | PACK | Freq: Every day | ORAL | Status: DC | PRN

## 2018-10-18 MED ORDER — CEFAZOLIN SODIUM-DEXTROSE 2-4 GM/100ML-% IV SOLN
INTRAVENOUS | Status: AC
  Filled 2018-10-18: qty 100

## 2018-10-18 MED ORDER — METOCLOPRAMIDE HCL 5 MG/ML IJ SOLN: 5.0000 mg | Freq: Three times a day (TID) | INTRAMUSCULAR | Status: DC | PRN

## 2018-10-18 MED ORDER — METOPROLOL TARTRATE 5 MG/5ML IV SOLN
INTRAVENOUS | Status: AC
  Filled 2018-10-18: qty 5

## 2018-10-18 MED ORDER — ONDANSETRON HCL 4 MG/2ML IJ SOLN: 4.0000 mg | Freq: Four times a day (QID) | INTRAMUSCULAR | Status: DC | PRN

## 2018-10-18 MED ORDER — HYDROMORPHONE HCL 2 MG PO TABS: 4.0000 mg | ORAL_TABLET | ORAL | Status: DC | PRN

## 2018-10-18 MED ORDER — HYDROMORPHONE HCL 2 MG PO TABS
2.0000 mg | ORAL_TABLET | ORAL | Status: DC | PRN
  Administered 2018-10-19: 2 mg via ORAL
  Filled 2018-10-18: qty 1

## 2018-10-18 MED ORDER — DEXAMETHASONE SODIUM PHOSPHATE 10 MG/ML IJ SOLN
8.0000 mg | Freq: Once | INTRAMUSCULAR | Status: AC
  Administered 2018-10-18: 8 mg via INTRAVENOUS

## 2018-10-18 MED ORDER — DEXAMETHASONE SODIUM PHOSPHATE 10 MG/ML IJ SOLN
10.0000 mg | Freq: Once | INTRAMUSCULAR | Status: AC
  Administered 2018-10-19: 10:00:00 10 mg via INTRAVENOUS
  Filled 2018-10-18: qty 1

## 2018-10-18 MED ORDER — CHLORHEXIDINE GLUCONATE 4 % EX LIQD: 60.0000 mL | Freq: Once | CUTANEOUS | Status: DC

## 2018-10-18 MED ORDER — CHLORHEXIDINE GLUCONATE CLOTH 2 % EX PADS: 6.0000 | MEDICATED_PAD | Freq: Every day | CUTANEOUS | Status: DC

## 2018-10-18 MED ORDER — ONDANSETRON HCL 4 MG PO TABS: 4.0000 mg | ORAL_TABLET | Freq: Four times a day (QID) | ORAL | Status: DC | PRN

## 2018-10-18 MED ORDER — CITALOPRAM HYDROBROMIDE 20 MG PO TABS
20.0000 mg | ORAL_TABLET | Freq: Every day | ORAL | Status: DC
  Administered 2018-10-19: 20 mg via ORAL
  Filled 2018-10-18: qty 1

## 2018-10-18 MED ORDER — ALPRAZOLAM 1 MG PO TABS
1.0000 mg | ORAL_TABLET | Freq: Every day | ORAL | Status: DC
  Administered 2018-10-18: 1 mg via ORAL
  Filled 2018-10-18: qty 1

## 2018-10-18 MED ORDER — DOCUSATE SODIUM 100 MG PO CAPS
100.0000 mg | ORAL_CAPSULE | Freq: Two times a day (BID) | ORAL | Status: DC
  Administered 2018-10-18 – 2018-10-19 (×2): 100 mg via ORAL
  Filled 2018-10-18 (×2): qty 1

## 2018-10-18 MED ORDER — FENTANYL CITRATE (PF) 100 MCG/2ML IJ SOLN
INTRAMUSCULAR | Status: AC
  Filled 2018-10-18: qty 2

## 2018-10-18 MED ORDER — BISACODYL 10 MG RE SUPP: 10.0000 mg | Freq: Every day | RECTAL | Status: DC | PRN

## 2018-10-18 MED ORDER — DEXAMETHASONE SODIUM PHOSPHATE 10 MG/ML IJ SOLN
INTRAMUSCULAR | Status: AC
  Filled 2018-10-18: qty 1

## 2018-10-18 MED ORDER — METOPROLOL TARTRATE 5 MG/5ML IV SOLN
3.0000 mg | Freq: Once | INTRAVENOUS | Status: AC
  Administered 2018-10-18: 3 mg via INTRAVENOUS

## 2018-10-18 MED ORDER — CEFAZOLIN SODIUM-DEXTROSE 2-4 GM/100ML-% IV SOLN
2.0000 g | INTRAVENOUS | Status: AC
  Administered 2018-10-18: 2 g via INTRAVENOUS

## 2018-10-18 MED ORDER — BUPIVACAINE-EPINEPHRINE 0.5% -1:200000 IJ SOLN
INTRAMUSCULAR | Status: DC | PRN
  Administered 2018-10-18: 30 mL

## 2018-10-18 MED ORDER — BUPIVACAINE IN DEXTROSE 0.75-8.25 % IT SOLN
INTRATHECAL | Status: DC | PRN
  Administered 2018-10-18: 1.6 mL via INTRATHECAL

## 2018-10-18 MED ORDER — TRANEXAMIC ACID-NACL 1000-0.7 MG/100ML-% IV SOLN
1000.0000 mg | INTRAVENOUS | Status: AC
  Administered 2018-10-18: 1000 mg via INTRAVENOUS

## 2018-10-18 MED ORDER — POVIDONE-IODINE 10 % EX SWAB
2.0000 "application " | Freq: Once | CUTANEOUS | Status: AC
  Administered 2018-10-18: 2 via TOPICAL

## 2018-10-18 MED ORDER — SODIUM CHLORIDE 0.9 % IV SOLN
INTRAVENOUS | Status: DC
  Administered 2018-10-18 – 2018-10-19 (×2): via INTRAVENOUS

## 2018-10-18 MED ORDER — LACTATED RINGERS IV SOLN
INTRAVENOUS | Status: DC
  Administered 2018-10-18 (×2): via INTRAVENOUS

## 2018-10-18 MED ORDER — ACETAMINOPHEN 10 MG/ML IV SOLN
INTRAVENOUS | Status: AC
  Filled 2018-10-18: qty 100

## 2018-10-18 MED ORDER — TRANEXAMIC ACID-NACL 1000-0.7 MG/100ML-% IV SOLN
INTRAVENOUS | Status: AC
  Filled 2018-10-18: qty 100

## 2018-10-18 MED ORDER — PHENOL 1.4 % MT LIQD: 1.0000 | OROMUCOSAL | Status: DC | PRN

## 2018-10-18 MED ORDER — FENTANYL CITRATE (PF) 100 MCG/2ML IJ SOLN: 25.0000 ug | INTRAMUSCULAR | Status: DC | PRN

## 2018-10-18 MED ORDER — METHOCARBAMOL 500 MG PO TABS: 500.0000 mg | ORAL_TABLET | Freq: Four times a day (QID) | ORAL | Status: DC | PRN

## 2018-10-18 MED ORDER — ACETAMINOPHEN 10 MG/ML IV SOLN
1000.0000 mg | Freq: Four times a day (QID) | INTRAVENOUS | Status: DC
  Administered 2018-10-18: 1000 mg via INTRAVENOUS

## 2018-10-18 MED ORDER — OXYCODONE HCL 5 MG/5ML PO SOLN: 5.0000 mg | Freq: Once | ORAL | Status: DC | PRN

## 2018-10-18 MED ORDER — PHENYLEPHRINE 40 MCG/ML (10ML) SYRINGE FOR IV PUSH (FOR BLOOD PRESSURE SUPPORT)
PREFILLED_SYRINGE | INTRAVENOUS | Status: DC | PRN
  Administered 2018-10-18: 120 ug via INTRAVENOUS
  Administered 2018-10-18: 80 ug via INTRAVENOUS
  Administered 2018-10-18 (×3): 120 ug via INTRAVENOUS
  Administered 2018-10-18: 80 ug via INTRAVENOUS

## 2018-10-18 MED ORDER — PHENYLEPHRINE 40 MCG/ML (10ML) SYRINGE FOR IV PUSH (FOR BLOOD PRESSURE SUPPORT)
PREFILLED_SYRINGE | INTRAVENOUS | Status: AC
  Filled 2018-10-18: qty 10

## 2018-10-18 MED ORDER — MENTHOL 3 MG MT LOZG: 1.0000 | LOZENGE | OROMUCOSAL | Status: DC | PRN

## 2018-10-18 MED ORDER — METHOCARBAMOL 500 MG IVPB - SIMPLE MED
500.0000 mg | Freq: Four times a day (QID) | INTRAVENOUS | Status: DC | PRN
  Administered 2018-10-18: 500 mg via INTRAVENOUS
  Filled 2018-10-18: qty 50

## 2018-10-18 MED ORDER — ESMOLOL HCL 100 MG/10ML IV SOLN
INTRAVENOUS | Status: DC | PRN
  Administered 2018-10-18: 30 mg via INTRAVENOUS
  Administered 2018-10-18: 20 mg via INTRAVENOUS
  Administered 2018-10-18: 30 mg via INTRAVENOUS

## 2018-10-18 MED ORDER — CEFAZOLIN SODIUM-DEXTROSE 1-4 GM/50ML-% IV SOLN
1.0000 g | Freq: Four times a day (QID) | INTRAVENOUS | Status: AC
  Administered 2018-10-18 – 2018-10-19 (×2): 1 g via INTRAVENOUS
  Filled 2018-10-18 (×2): qty 50

## 2018-10-18 MED ORDER — WARFARIN SODIUM 2.5 MG PO TABS
2.5000 mg | ORAL_TABLET | Freq: Once | ORAL | Status: AC
  Administered 2018-10-18: 2.5 mg via ORAL
  Filled 2018-10-18: qty 1

## 2018-10-18 MED ORDER — FENTANYL CITRATE (PF) 100 MCG/2ML IJ SOLN
INTRAMUSCULAR | Status: DC | PRN
  Administered 2018-10-18 (×4): 50 ug via INTRAVENOUS

## 2018-10-18 MED ORDER — BUPIVACAINE-EPINEPHRINE (PF) 0.5% -1:200000 IJ SOLN
INTRAMUSCULAR | Status: AC
  Filled 2018-10-18: qty 30

## 2018-10-18 MED ORDER — PROPOFOL 10 MG/ML IV BOLUS
INTRAVENOUS | Status: AC
  Filled 2018-10-18: qty 80

## 2018-10-18 MED ORDER — ONDANSETRON HCL 4 MG/2ML IJ SOLN
INTRAMUSCULAR | Status: DC | PRN
  Administered 2018-10-18: 4 mg via INTRAVENOUS

## 2018-10-18 MED ORDER — MORPHINE SULFATE (PF) 2 MG/ML IV SOLN: 0.5000 mg | INTRAVENOUS | Status: DC | PRN

## 2018-10-18 MED ORDER — METOCLOPRAMIDE HCL 5 MG PO TABS: 5.0000 mg | ORAL_TABLET | Freq: Three times a day (TID) | ORAL | Status: DC | PRN

## 2018-10-18 MED ORDER — DILTIAZEM HCL ER COATED BEADS 240 MG PO CP24
240.0000 mg | ORAL_CAPSULE | Freq: Every day | ORAL | Status: DC
  Administered 2018-10-19: 240 mg via ORAL
  Filled 2018-10-18: qty 1

## 2018-10-18 MED ORDER — FLEET ENEMA 7-19 GM/118ML RE ENEM: 1.0000 | ENEMA | Freq: Once | RECTAL | Status: DC | PRN

## 2018-10-18 MED ORDER — PROPOFOL 10 MG/ML IV BOLUS
INTRAVENOUS | Status: DC | PRN
  Administered 2018-10-18: 10 mg via INTRAVENOUS
  Administered 2018-10-18: 140 mg via INTRAVENOUS
  Administered 2018-10-18: 20 mg via INTRAVENOUS

## 2018-10-18 MED ORDER — SUCCINYLCHOLINE CHLORIDE 20 MG/ML IJ SOLN
INTRAMUSCULAR | Status: DC | PRN
  Administered 2018-10-18: 100 mg via INTRAVENOUS

## 2018-10-18 MED ORDER — FENTANYL CITRATE (PF) 100 MCG/2ML IJ SOLN
25.0000 ug | INTRAMUSCULAR | Status: DC | PRN
  Administered 2018-10-18: 25 ug via INTRAVENOUS
  Administered 2018-10-18 (×2): 50 ug via INTRAVENOUS
  Administered 2018-10-18: 25 ug via INTRAVENOUS

## 2018-10-18 MED ORDER — METHOCARBAMOL 500 MG IVPB - SIMPLE MED
INTRAVENOUS | Status: AC
  Filled 2018-10-18: qty 50

## 2018-10-18 MED ORDER — ACETAMINOPHEN 500 MG PO TABS
500.0000 mg | ORAL_TABLET | Freq: Four times a day (QID) | ORAL | Status: AC
  Administered 2018-10-18 – 2018-10-19 (×4): 500 mg via ORAL
  Filled 2018-10-18 (×4): qty 1

## 2018-10-18 SURGICAL SUPPLY — 52 items
BAG DECANTER FOR FLEXI CONT (MISCELLANEOUS) IMPLANT
BAG ZIPLOCK 12X15 (MISCELLANEOUS) IMPLANT
BALL HIP ARTICU 28 +5 (Hips) IMPLANT
BLADE SAG 18X100X1.27 (BLADE) ×3 IMPLANT
CABLE CERLAGE W/CRIMP 1.8 (Cable) ×1 IMPLANT
CABLE CERLAGE W/CRIMP 1.8MM (Cable) ×1 IMPLANT
CABLE GTR COCR 1.8MMX635MM (Orthopedic Implant) IMPLANT
CABLE GTR COCR 1.8X635 (Orthopedic Implant) IMPLANT
CLOSURE STERI-STRIP 1/2X4 (GAUZE/BANDAGES/DRESSINGS) ×2
CLOSURE WOUND 1/2 X4 (GAUZE/BANDAGES/DRESSINGS) ×1
CLSR STERI-STRIP ANTIMIC 1/2X4 (GAUZE/BANDAGES/DRESSINGS) ×2 IMPLANT
COVER PERINEAL POST (MISCELLANEOUS) ×3 IMPLANT
COVER SURGICAL LIGHT HANDLE (MISCELLANEOUS) ×3 IMPLANT
COVER WAND RF STERILE (DRAPES) IMPLANT
CUP ACETBLR 48 OD SECTOR II (Hips) ×2 IMPLANT
DECANTER SPIKE VIAL GLASS SM (MISCELLANEOUS) ×3 IMPLANT
DRAPE STERI IOBAN 125X83 (DRAPES) ×3 IMPLANT
DRAPE U-SHAPE 47X51 STRL (DRAPES) ×6 IMPLANT
DRSG ADAPTIC 3X8 NADH LF (GAUZE/BANDAGES/DRESSINGS) ×3 IMPLANT
DRSG MEPILEX BORDER 4X4 (GAUZE/BANDAGES/DRESSINGS) ×3 IMPLANT
DRSG MEPILEX BORDER 4X8 (GAUZE/BANDAGES/DRESSINGS) ×3 IMPLANT
DURAPREP 26ML APPLICATOR (WOUND CARE) ×3 IMPLANT
ELECT REM PT RETURN 15FT ADLT (MISCELLANEOUS) ×3 IMPLANT
EVACUATOR 1/8 PVC DRAIN (DRAIN) ×3 IMPLANT
GLOVE BIO SURGEON STRL SZ 6 (GLOVE) IMPLANT
GLOVE BIO SURGEON STRL SZ7 (GLOVE) IMPLANT
GLOVE BIO SURGEON STRL SZ8 (GLOVE) ×3 IMPLANT
GLOVE BIOGEL PI IND STRL 6.5 (GLOVE) IMPLANT
GLOVE BIOGEL PI IND STRL 7.0 (GLOVE) IMPLANT
GLOVE BIOGEL PI IND STRL 8 (GLOVE) ×1 IMPLANT
GLOVE BIOGEL PI INDICATOR 6.5 (GLOVE)
GLOVE BIOGEL PI INDICATOR 7.0 (GLOVE)
GLOVE BIOGEL PI INDICATOR 8 (GLOVE) ×2
GOWN STRL REUS W/TWL LRG LVL3 (GOWN DISPOSABLE) ×3 IMPLANT
GOWN STRL REUS W/TWL XL LVL3 (GOWN DISPOSABLE) IMPLANT
HIP BALL ARTICU 28 +5 (Hips) ×3 IMPLANT
HOLDER FOLEY CATH W/STRAP (MISCELLANEOUS) ×3 IMPLANT
KIT TURNOVER KIT A (KITS) IMPLANT
LINER MARATHON 28 48 (Hips) ×2 IMPLANT
MANIFOLD NEPTUNE II (INSTRUMENTS) ×3 IMPLANT
PACK ANTERIOR HIP CUSTOM (KITS) ×3 IMPLANT
STEM FEMORAL SZ6 HIGH ACTIS (Stem) ×2 IMPLANT
STRIP CLOSURE SKIN 1/2X4 (GAUZE/BANDAGES/DRESSINGS) ×2 IMPLANT
SUT ETHIBOND NAB CT1 #1 30IN (SUTURE) ×3 IMPLANT
SUT MNCRL AB 4-0 PS2 18 (SUTURE) ×3 IMPLANT
SUT STRATAFIX 0 PDS 27 VIOLET (SUTURE) ×3
SUT VIC AB 2-0 CT1 27 (SUTURE) ×4
SUT VIC AB 2-0 CT1 TAPERPNT 27 (SUTURE) ×2 IMPLANT
SUTURE STRATFX 0 PDS 27 VIOLET (SUTURE) ×1 IMPLANT
SYR 50ML LL SCALE MARK (SYRINGE) IMPLANT
TRAY FOLEY MTR SLVR 16FR STAT (SET/KITS/TRAYS/PACK) ×3 IMPLANT
YANKAUER SUCT BULB TIP 10FT TU (MISCELLANEOUS) ×3 IMPLANT

## 2018-10-18 NOTE — Anesthesia Procedure Notes (Signed)
Procedure Name: MAC Date/Time: 10/18/2018 1:34 PM Performed by: Niel Hummer, CRNA Pre-anesthesia Checklist: Patient identified, Emergency Drugs available, Suction available and Patient being monitored Patient Re-evaluated:Patient Re-evaluated prior to induction Oxygen Delivery Method: Simple face mask

## 2018-10-18 NOTE — H&P (Signed)
TOTAL HIP ADMISSION H&P  Patient is admitted for left total hip arthroplasty.  Subjective:  Chief Complaint: left hip pain  HPI: Marissa Turner, 78 y.o. female, has a history of pain and functional disability in the left hip(s) due to arthritis and patient has failed non-surgical conservative treatments for greater than 12 weeks to include NSAID's and/or analgesics, use of assistive devices and activity modification.  Onset of symptoms was gradual starting >10 years ago with gradually worsening course since that time.The patient noted no past surgery on the left hip(s).  Patient currently rates pain in the left hip at 7 out of 10 with activity. Patient has night pain, worsening of pain with activity and weight bearing, pain that interfers with activities of daily living and pain with passive range of motion. Patient has evidence of periarticular osteophytes and joint space narrowing by imaging studies. This condition presents safety issues increasing the risk of falls.   There is no current active infection.  Patient Active Problem List   Diagnosis Date Noted  . 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 Hamartoma of lung ([place]) 04/20/2018  . S/P Left Lower Lobectomy, Wedge Resection LUL 04/15/2018  . Positive colorectal cancer screening using Cologuard test 03/22/2018  . Depression 07/25/2015  . Knee pain, right 03/22/2015  . Iron deficiency 10/17/2013  . 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 Encounter for therapeutic drug monitoring 02/24/2013  . DJD (degenerative joint disease), lumbosacral   . Atrial fibrillation, chronic   . Chronic anticoagulation   . Tobacco abuse   . Peptic ulcer disease   . Hypertension   . SPINAL STENOSIS 02/03/2008   Past Medical History:  Diagnosis Date  . Atrial fibrillation, chronic 2004   left atrial thrombus in 2004; near syncope in 2008; echocardiogram in 2008-normal EF, mild left atrial enlargement, no valvular abnormalities,  lipomatous hypertrophy; no aortic atheroma on TEE in 2004  . Cancer (HCC)    LYMPH NODE    and lung cancer  . Chronic anticoagulation 2004   warfarin  . COPD (chronic obstructive pulmonary disease) ([place])    denies  . Depression   . DJD (degenerative joint disease), lumbosacral    Also hip  . Dysrhythmia    chronic AFib  . Family history of adverse reaction to anesthesia    sister PONV  . Fracture of wrist    Left  . History of kidney stones    once  . Hypotension   . Panic attacks   . Peptic ulcer disease    s/p Billroth II  . PONV (postoperative nausea and vomiting)   . Tobacco abuse    40 pack years; quit attempt in 2012    Past Surgical History:  Procedure Laterality Date  . ABDOMINAL HYSTERECTOMY     w/o oophorectomy  partial  . APPENDECTOMY    . CESAREAN SECTION    . CHOLECYSTECTOMY  2004   [place]   patient denies ever having undergone colonoscopy as of 2014  . GASTROJEJUNOSTOMY  1984   Billroth II; splenectomy; incidental appendectomy  . KNEE ARTHROSCOPY WITH MEDIAL MENISECTOMY Right 02/26/2015   Procedure: KNEE ARTHROSCOPY WITH MEDIAL MENISECTOMY;  Surgeon: Sanjuana Kava, MD;  Location: [facility];  Service: Orthopedics;  Laterality: Right;  . LYMPH NODE BIOPSY Left 09/15/2013   Procedure: LEFT NECK CERVICAL NODE BIOPSY;  Surgeon: Scherry Ran, MD;  Location: [facility];  Service: General;  Laterality: Left;  . PORT-A-CATH  REMOVAL Right 07/12/2014   Procedure: REMOVAL PORT-A-CATH;  Surgeon: Aviva Signs Md, MD;  Location: [facility];  Service: General;  Laterality: Right;  . PORTACATH PLACEMENT Right 10/21/2013   Procedure: ATTEMPTED INSERTION PORT-A-CATH;  Surgeon: Scherry Ran, MD;  Location: [facility];  Service: General;  Laterality: Right;  . SPLENECTOMY, TOTAL    . THORACOSCOPY Left 04/15/2018   VIDEO ASSISTED THORACOSCOPY (VATS)/LEFT LOWER LOBECTOMY (Left)  . 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  [name] VIDEO ASSISTED  THORACOSCOPY (VATS)/ LOBECTOMY Left 04/15/2018   Procedure: VIDEO ASSISTED THORACOSCOPY (VATS)/LEFT LOWER LOBECTOMY;  Surgeon: Melrose Nakayama, MD;  Location: [place];  Service: Thoracic;  Laterality: Left;  . WEDGE RESECTION Left 04/15/2018   Procedure: LEFT UPPER WEDGE RESECTION;  Surgeon: Melrose Nakayama, MD;  Location[facility] OR;  Service: Thoracic;  Laterality: Left;    Current Facility-Administered Medications  Medication Dose Route Frequency Provider Last Rate Last Dose  . acetaminophen (OFIRMEV) 10 MG/ML IV           . acetaminophen (OFIRMEV) IV 1,000 mg  1,000 mg Intravenous Q6H Edmisten, Kristie L, PA      . ceFAZolin (ANCEF) 2-4 GM/100ML-% IVPB           . ceFAZolin (ANCEF) IVPB 2g/100 mL premix  2 g Intravenous On Call to OR Edmisten, Kristie L, PA      . chlorhexidine (HIBICLENS) 4 % liquid 4 application  60 mL Topical Once Edmisten, Kristie L, PA      . dexamethasone (DECADRON) injection 8 mg  8 mg Intravenous Once Edmisten, Kristie L, PA      . lactated ringers infusion   Intravenous Continuous Edmisten, Kristie L, PA 75 mL/hr at 10/18/18 1200    . tranexamic acid (CYKLOKAPRON) 1000MG /147mL IVPB           . tranexamic acid (CYKLOKAPRON) IVPB 1,000 mg  1,000 mg Intravenous To OR Edmisten, Kristie L, PA       Allergies  Allergen Reactions  . Estrogens Swelling    edema  . Codeine Swelling and Rash  . Penicillins Other (See Comments)    Break out in welts. Did it involve swelling of the face/tongue/throat, SOB, or low BP? No Did it involve sudden or severe rash/hives, skin peeling, or any reaction on the inside of your mouth or nose? No Did you need to seek medical attention at a hospital or doctor's office? No When did it last happen? Childhood allergy If all above answers are "NO", may proceed with cephalosporin use.   . Diphenhydramine Hcl Other (See Comments)    hallucinations     Social History   Tobacco Use  . Smoking status: Former Smoker     Packs/day: 0.50    Years: 55.00    Pack years: 27.50    Types: Cigarettes  . Smokeless tobacco: Never Used  . Tobacco comment: quit quit in june  Substance Use Topics  . Alcohol use: No    Family History  Problem Relation Age of Onset  . Heart attack Mother        also brother  . Breast cancer Sister   . Heart attack Brother   . Breast cancer Sister        x2  . Cancer Brother        Gastric carcinoma; also father  . Cirrhosis Sister      Review of Systems  Constitutional: Negative.   HENT: Negative.   Eyes: Negative.  Respiratory: Negative.   Cardiovascular: Negative.   Gastrointestinal: Negative.   Genitourinary: Negative.   Musculoskeletal: Positive for joint pain and myalgias. Negative for back pain, falls and neck pain.  Skin: Negative.   Neurological: Negative.   Endo/Heme/Allergies: Negative.   Psychiatric/Behavioral: Negative.     Objective:  Physical Exam  Constitutional: She is oriented to person, place, and time. She appears well-developed and well-nourished. No distress.  HENT:  Head: Normocephalic and atraumatic.  Right Ear: External ear normal.  Left Ear: External ear normal.  Nose: Nose normal.  Mouth/Throat: Oropharynx is clear and moist.  Eyes: Conjunctivae and EOM are normal.  Neck: Normal range of motion. Neck supple.  Cardiovascular: Normal rate, regular rhythm, normal heart sounds and intact distal pulses.  No murmur heard. Respiratory: Effort normal and breath sounds normal. No respiratory distress. She has no wheezes.  GI: Soft. Bowel sounds are normal. She exhibits no distension. There is no abdominal tenderness.  Musculoskeletal:     Comments: The patient presents today in a wheelchair.  Left hip examination: The left lower extremity is 1.5 inches shorter than her right. Hip ROM in flexion to 90 degrees, 0 degrees internal rotation and external rotation, and abduction to 10 degrees.  Left knee examination: Unremarkable.  Right  hip examination: Normal.  Neurological: She is alert and oriented to person, place, and time. She has normal strength. No sensory deficit.  Skin: No rash noted. She is not diaphoretic. No erythema.  Psychiatric: She has a normal mood and affect. Her behavior is normal.    Vital signs in last 24 hours: Temp:  [97.7 F (36.5 C)] 97.7 F (36.5 C) (09/21 1154) Pulse Rate:  [78] 78 (09/21 1154) Resp:  [15] 15 (09/21 1154) BP: (140)/(99) 140/99 (09/21 1154) SpO2:  [95 %] 95 % (09/21 1154)  Labs:   Estimated body mass index is 24.51 kg/m as calculated from the following:   Height as of 10/15/18: 5' (1.524 m).   Weight as of 10/15/18: 56.9 kg.   Imaging Review Plain radiographs demonstrate severe degenerative joint disease of the left hip(s). The bone quality appears to be good for age and reported activity level.    Assessment/Plan:  End stage primary osteoarthritis, left hip(s)  The patient history, physical examination, clinical judgement of the provider and imaging studies are consistent with end stage degenerative joint disease of the left hip(s) and total hip arthroplasty is deemed medically necessary. The treatment options including medical management, injection therapy, arthroscopy and arthroplasty were discussed at length. The risks and benefits of total hip arthroplasty were presented and reviewed. The risks due to aseptic loosening, infection, stiffness, dislocation/subluxation,  thromboembolic complications and other imponderables were discussed.  The patient acknowledged the explanation, agreed to proceed with the plan and consent was signed. Patient is being admitted for inpatient treatment for surgery, pain control, PT, OT, prophylactic antibiotics, VTE prophylaxis, progressive ambulation and ADL's and discharge planning.The patient is planning to be discharged home.  Risks and benefits of the surgery were discussed with the patient and Dr.Aluisio at their previous office  visit, and the patient has elected to move forward with the aforementioned surgery. Post-operative care plans were discussed with the patient today.  Therapy Plans: HEP Disposition: Home with husband and granddaughter Planned DVT prophylaxis: resume warfarin with lovenox bridge until INR 2-3 DME needed: has walker; needs 3-n-1 PCP: Dr. [name] Other: no anesthesia concerns  Ardeen Jourdain, PA-C

## 2018-10-18 NOTE — Interval H&P Note (Signed)
History and Physical Interval Note:  10/18/2018 12:36 PM  Marissa Turner  has presented today for surgery, with the diagnosis of left hip osteoarthritis.  The various methods of treatment have been discussed with the patient and family. After consideration of risks, benefits and other options for treatment, the patient has consented to  Procedure(s) with comments: [place] (Left) - 11min as a surgical intervention.  The patient's history has been reviewed, patient examined, no change in status, stable for surgery.  I have reviewed the patient's chart and labs.  Questions were answered to the patient's satisfaction.     Pilar Plate Bevely Hackbart

## 2018-10-18 NOTE — Anesthesia Procedure Notes (Signed)
Procedure Name: Intubation Date/Time: 10/18/2018 2:32 PM Performed by: Albertha Ghee, MD Pre-anesthesia Checklist: Patient identified, Emergency Drugs available, Suction available and Patient being monitored Patient Re-evaluated:Patient Re-evaluated prior to induction Oxygen Delivery Method: Circle system utilized Preoxygenation: Pre-oxygenation with 100% oxygen Induction Type: IV induction Laryngoscope Size: Mac and 3 Grade View: Grade I Tube type: Oral Tube size: 7.0 mm Number of attempts: 1 Airway Equipment and Method: Stylet Placement Confirmation: ETT inserted through vocal cords under direct vision,  positive ETCO2 and breath sounds checked- equal and bilateral Secured at: 21 cm Tube secured with: Tape Dental Injury: Teeth and Oropharynx as per pre-operative assessment

## 2018-10-18 NOTE — Anesthesia Procedure Notes (Signed)
Procedure Name: LMA Insertion Date/Time: 10/18/2018 1:57 PM Performed by: Niel Hummer, CRNA Pre-anesthesia Checklist: Patient identified, Emergency Drugs available, Suction available and Patient being monitored Patient Re-evaluated:Patient Re-evaluated prior to induction Oxygen Delivery Method: Circle system utilized Preoxygenation: Pre-oxygenation with 100% oxygen Induction Type: IV induction LMA: LMA inserted LMA Size: 3.0 Dental Injury: Teeth and Oropharynx as per pre-operative assessment

## 2018-10-18 NOTE — Anesthesia Procedure Notes (Signed)
Spinal  Patient location during procedure: OR Start time: 10/18/2018 1:36 PM End time: 10/18/2018 1:39 PM Staffing Resident/CRNA: Niel Hummer, CRNA Performed: resident/CRNA  Preanesthetic Checklist Completed: patient identified, surgical consent, pre-op evaluation, IV checked, risks and benefits discussed and monitors and equipment checked Spinal Block Patient position: sitting Prep: DuraPrep Patient monitoring: heart rate, blood pressure and continuous pulse ox Approach: midline Location: L3-4 Injection technique: single-shot Needle Needle type: Pencan  Needle gauge: 24 G

## 2018-10-18 NOTE — Anesthesia Preprocedure Evaluation (Signed)
Anesthesia Evaluation  Patient identified by MRN, date of birth, ID band Patient awake    Reviewed: Allergy & Precautions, H&P , NPO status , Patient's Chart, lab work & pertinent test results  History of Anesthesia Complications (+) PONV and history of anesthetic complications  Airway Mallampati: II   Neck ROM: full    Dental   Pulmonary COPD, former smoker,  H/o lung CA. S/p LL lobectomy   breath sounds clear to auscultation       Cardiovascular hypertension, + dysrhythmias Atrial Fibrillation  Rhythm:regular Rate:Normal     Neuro/Psych PSYCHIATRIC DISORDERS Anxiety Depression    GI/Hepatic PUD,   Endo/Other    Renal/GU      Musculoskeletal  (+) Arthritis ,   Abdominal   Peds  Hematology   Anesthesia Other Findings   Reproductive/Obstetrics                             Anesthesia Physical Anesthesia Plan  ASA: III  Anesthesia Plan: MAC and Spinal   Post-op Pain Management:    Induction: Intravenous  PONV Risk Score and Plan: 3 and Ondansetron, Dexamethasone, Treatment may vary due to age or medical condition and Propofol infusion  Airway Management Planned: Simple Face Mask  Additional Equipment:   Intra-op Plan:   Post-operative Plan:   Informed Consent: I have reviewed the patients History and Physical, chart, labs and discussed the procedure including the risks, benefits and alternatives for the proposed anesthesia with the patient or authorized representative who has indicated his/her understanding and acceptance.       Plan Discussed with: CRNA, Anesthesiologist and Surgeon  Anesthesia Plan Comments:         Anesthesia Quick Evaluation

## 2018-10-18 NOTE — Transfer of Care (Signed)
Immediate Anesthesia Transfer of Care Note  Patient: Marissa Turner  Procedure(s) Performed: TOTAL HIP ARTHROPLASTY ANTERIOR APPROACH (Left Hip)  Patient Location: PACU  Anesthesia Type:General  Level of Consciousness: awake, alert  and oriented  Airway & Oxygen Therapy: Patient Spontanous Breathing and Patient connected to face mask oxygen  Post-op Assessment: Report given to RN and Post -op Vital signs reviewed and stable  Post vital signs: Reviewed and stable  Last Vitals:  Vitals Value Taken Time  BP 154/100 10/18/18 1549  Temp    Pulse 96 10/18/18 1551  Resp 16 10/18/18 1551  SpO2 98 % 10/18/18 1551  Vitals shown include unvalidated device data.  Last Pain:  Vitals:   10/18/18 1154  TempSrc: Oral         Complications: No apparent anesthesia complications

## 2018-10-18 NOTE — Discharge Instructions (Signed)
°Dr. Frank Aluisio °Total Joint Specialist °[facility] °[address] °[place], [place] [address] °[phone] ° °ANTERIOR APPROACH TOTAL HIP REPLACEMENT POSTOPERATIVE DIRECTIONS ° ° °Hip Rehabilitation, Guidelines Following Surgery  °The results of a hip operation are greatly improved after range of motion and muscle strengthening exercises. Follow all safety measures which are given to protect your hip. If any of these exercises cause increased pain or swelling in your joint, decrease the amount until you are comfortable again. Then slowly increase the exercises. Call your caregiver if you have problems or questions.  ° °HOME CARE INSTRUCTIONS  °• Remove items at home which could result in a fall. This includes throw rugs or furniture in walking pathways.  °· ICE to the affected hip every three hours for 30 minutes at a time and then as needed for pain and swelling.  Continue to use ice on the hip for pain and swelling from surgery. You may notice swelling that will progress down to the foot and ankle.  This is normal after surgery.  Elevate the leg when you are not up walking on it.   °· Continue to use the breathing machine which will help keep your temperature down.  It is common for your temperature to cycle up and down following surgery, especially at night when you are not up moving around and exerting yourself.  The breathing machine keeps your lungs expanded and your temperature down. ° °DIET °You may resume your previous home diet once your are discharged from the hospital. ° °DRESSING / WOUND CARE / SHOWERING °You may shower 3 days after surgery, but keep the wounds dry during showering.  You may use an occlusive plastic wrap (Press'n Seal for example), NO SOAKING/SUBMERGING IN THE BATHTUB.  If the bandage gets wet, change with a clean dry gauze.  If the incision gets wet, pat the wound dry with a clean towel. °You may start showering once you are discharged home but do not submerge the  incision under water. Just pat the incision dry and apply a dry gauze dressing on daily. °Change the surgical dressing daily and reapply a dry dressing each time. ° °ACTIVITY °Walk with your walker as instructed. °Use walker as long as suggested by your caregivers. °Avoid periods of inactivity such as sitting longer than an hour when not asleep. This helps prevent blood clots.  °You may resume a sexual relationship in one month or when given the OK by your doctor.  °You may return to work once you are cleared by your doctor.  °Do not drive a car for 6 weeks or until released by you surgeon.  °Do not drive while taking narcotics. ° °WEIGHT BEARING °Weight bearing as tolerated with assist device (walker, cane, etc) as directed, use it as long as suggested by your surgeon or therapist, typically at least 4-6 weeks. ° °POSTOPERATIVE CONSTIPATION PROTOCOL °Constipation - defined medically as fewer than three stools per week and severe constipation as less than one stool per week. ° °One of the most common issues patients have following surgery is constipation.  Even if you have a regular bowel pattern at home, your normal regimen is likely to be disrupted due to multiple reasons following surgery.  Combination of anesthesia, postoperative narcotics, change in appetite and fluid intake all can affect your bowels.  In order to avoid complications following surgery, here are some recommendations in order to help you during your recovery period. ° °Colace (docusate) - Pick up an over-the-counter form   of Colace or another stool softener and take twice a day as long as you are requiring postoperative pain medications.  Take with a full glass of water daily.  If you experience loose stools or diarrhea, hold the colace until you stool forms back up.  If your symptoms do not get better within 1 week or if they get worse, check with your doctor. ° °Dulcolax (bisacodyl) - Pick up over-the-counter and take as directed by the product  packaging as needed to assist with the movement of your bowels.  Take with a full glass of water.  Use this product as needed if not relieved by Colace only.  ° °MiraLax (polyethylene glycol) - Pick up over-the-counter to have on hand.  MiraLax is a solution that will increase the amount of water in your bowels to assist with bowel movements.  Take as directed and can mix with a glass of water, juice, soda, coffee, or tea.  Take if you go more than two days without a movement. °Do not use MiraLax more than once per day. Call your doctor if you are still constipated or irregular after using this medication for 7 days in a row. ° °If you continue to have problems with postoperative constipation, please contact the office for further assistance and recommendations.  If you experience "the worst abdominal pain ever" or develop nausea or vomiting, please contact the office immediatly for further recommendations for treatment. ° °ITCHING ° If you experience itching with your medications, try taking only a single pain pill, or even half a pain pill at a time.  You can also use Benadryl over the counter for itching or also to help with sleep.  ° °TED HOSE STOCKINGS °Wear the elastic stockings on both legs for three weeks following surgery during the day but you may remove then at night for sleeping. ° °MEDICATIONS °See your medication summary on the “After Visit Summary” that the nursing staff will review with you prior to discharge.  You may have some home medications which will be placed on hold until you complete the course of blood thinner medication.  It is important for you to complete the blood thinner medication as prescribed by your surgeon.  Continue your approved medications as instructed at time of discharge. ° °PRECAUTIONS °If you experience chest pain or shortness of breath - call 911 immediately for transfer to the hospital emergency department.  °If you develop a fever greater that 101 F, purulent drainage  from wound, increased redness or drainage from wound, foul odor from the wound/dressing, or calf pain - CONTACT YOUR SURGEON.   °                                                °FOLLOW-UP APPOINTMENTS °Make sure you keep all of your appointments after your operation with your surgeon and caregivers. You should call the office at the above phone number and make an appointment for approximately two weeks after the date of your surgery or on the date instructed by your surgeon outlined in the "After Visit Summary". ° °RANGE OF MOTION AND STRENGTHENING EXERCISES  °These exercises are designed to help you keep full movement of your hip joint. Follow your caregiver's or physical therapist's instructions. Perform all exercises about fifteen times, three times per day or as directed. Exercise both hips, even if you have   had only one joint replacement. These exercises can be done on a training (exercise) mat, on the floor, on a table or on a bed. Use whatever works the best and is most comfortable for you. Use music or television while you are exercising so that the exercises are a pleasant break in your day. This will make your life better with the exercises acting as a break in routine you can look forward to.  °• Lying on your back, slowly slide your foot toward your buttocks, raising your knee up off the floor. Then slowly slide your foot back down until your leg is straight again.  °• Lying on your back spread your legs as far apart as you can without causing discomfort.  °• Lying on your side, raise your upper leg and foot straight up from the floor as far as is comfortable. Slowly lower the leg and repeat.  °• Lying on your back, tighten up the muscle in the front of your thigh (quadriceps muscles). You can do this by keeping your leg straight and trying to raise your heel off the floor. This helps strengthen the largest muscle supporting your knee.  °• Lying on your back, tighten up the muscles of your buttocks both  with the legs straight and with the knee bent at a comfortable angle while keeping your heel on the floor.  ° °IF YOU ARE TRANSFERRED TO A SKILLED REHAB FACILITY °If the patient is transferred to a skilled rehab facility following release from the hospital, a list of the current medications will be sent to the facility for the patient to continue.  When discharged from the skilled rehab facility, please have the facility set up the patient's Home Health Physical Therapy prior to being released. Also, the skilled facility will be responsible for providing the patient with their medications at time of release from the facility to include their pain medication, the muscle relaxants, and their blood thinner medication. If the patient is still at the rehab facility at time of the two week follow up appointment, the skilled rehab facility will also need to assist the patient in arranging follow up appointment in our office and any transportation needs. ° °MAKE SURE YOU:  °• Understand these instructions.  °• Get help right away if you are not doing well or get worse.  ° ° °Pick up stool softner and laxative for home use following surgery while on pain medications. °Do not submerge incision under water. °Please use good hand washing techniques while changing dressing each day. °May shower starting three days after surgery. °Please use a clean towel to pat the incision dry following showers. °Continue to use ice for pain and swelling after surgery. °Do not use any lotions or creams on the incision until instructed by your surgeon. ° °

## 2018-10-18 NOTE — Progress Notes (Signed)
Crisp for warfarin Indication: atrial fibrillation  Allergies  Allergen Reactions  . Estrogens Swelling    edema  . Codeine Swelling and Rash  . Penicillins Other (See Comments)    Break out in welts. Did it involve swelling of the face/tongue/throat, SOB, or low BP? No Did it involve sudden or severe rash/hives, skin peeling, or any reaction on the inside of your mouth or nose? No Did you need to seek medical attention at a hospital or doctor's office? No When did it last happen? Childhood allergy If all above answers are "NO", may proceed with cephalosporin use.   . Diphenhydramine Hcl Other (See Comments)    hallucinations     Patient Measurements:    Vital Signs: Temp: 97.4 F (36.3 C) (09/21 1815) Temp Source: Oral (09/21 1154) BP: 107/83 (09/21 1837) Pulse Rate: 113 (09/21 1837)  Labs: No results for input(s): HGB, HCT, PLT, APTT, LABPROT, INR, HEPARINUNFRC, HEPRLOWMOCWT, CREATININE, CKTOTAL, CKMB, TROPONINIHS in the last 72 hours.  Estimated Creatinine Clearance: 46.6 mL/min (by C-G formula based on SCr of 0.68 mg/dL).   Medical History: Past Medical History:  Diagnosis Date  . Atrial fibrillation, chronic 2004   left atrial thrombus in 2004; near syncope in 2008; echocardiogram in 2008-normal EF, mild left atrial enlargement, no valvular abnormalities, lipomatous hypertrophy; no aortic atheroma on TEE in 2004  . Cancer (HCC)    LYMPH NODE    and lung cancer  . Chronic anticoagulation 2004   warfarin  . COPD (chronic obstructive pulmonary disease) ([place])    denies  . Depression   . DJD (degenerative joint disease), lumbosacral    Also hip  . Dysrhythmia    chronic AFib  . Family history of adverse reaction to anesthesia    sister PONV  . Fracture of wrist    Left  . History of kidney stones    once  . Hypotension   . Panic attacks   . Peptic ulcer disease    s/p Billroth II  . PONV (postoperative  nausea and vomiting)   . Tobacco abuse    40 pack years; quit attempt in 2012    Medications:  Medications Prior to Admission  Medication Sig Dispense Refill Last Dose  . acetaminophen (TYLENOL) 500 MG tablet Take 1,000 mg by mouth every 6 (six) hours as needed for moderate pain or headache.    10/17/2018 at Unknown time  . ALPRAZolam (XANAX) 1 MG tablet Take 1 tablet (1 mg total) by mouth 3 (three) times daily as needed for anxiety. (Patient taking differently: Take 1 mg by mouth at bedtime. ) 30 tablet 0 10/18/2018 at Unknown time  . citalopram (CELEXA) 20 MG tablet Take 20 mg by mouth daily.   10/18/2018 at Unknown time  . diltiazem (CARDIZEM CD) 240 MG 24 hr capsule Take 1 capsule (240 mg total) by mouth daily. 90 capsule 3 10/18/2018 at Unknown time  . warfarin (COUMADIN) 5 MG tablet Take 1/2 tablet daily except 1 tablet on Sundays, Tuesdays and Thursdays (Patient taking differently: Take 2.5-5 mg by mouth See admin instructions. Take 2.5 mg by mouth daily except Sundays, Tuesdays and Thursdays take 5 mg) 90 tablet 3 08/12/18 at 1730   Scheduled:  . acetaminophen  500 mg Oral Q6H  . ALPRAZolam  1 mg Oral QHS  . Chlorhexidine Gluconate Cloth  6 each Topical Daily  . [START ON 10/19/2018] citalopram  20 mg Oral Daily  . [START ON 10/19/2018] dexamethasone (  DECADRON) injection  10 mg Intravenous Once  . [START ON 10/19/2018] diltiazem  240 mg Oral Daily  . docusate sodium  100 mg Oral BID  . fentaNYL      . fentaNYL      . metoprolol tartrate      . warfarin  2.5 mg Oral Once  . Warfarin - Pharmacist Dosing Inpatient   Does not apply q1800   Assessment: 63 yoF with PMH Afib on warfarin now s/p L THA; pharmacy to continue warfarin while admitted   Baseline INR non-therapeutic after warfarin washout  Prior anticoagulation: warfarin 2.5 mg daily except 5 mg on Tues, Thurs, Sun (LD 9/16)  Significant events:  Today, 10/18/2018:  CBC: WNL pre-op (9/18)  INR SUBtherapeutic  Major drug  interactions: none  No bleeding issues per nursing  Diet ordered  Goal of Therapy: INR 2-3  Plan:  Warfarin 2.5 mg PO tonight at 18:00  Daily INR  CBC at least q72 hr while on warfarin  Monitor for signs of bleeding or thrombosis   Reuel Boom, PharmD, BCPS [phone] 10/18/2018, 7:20 PM

## 2018-10-18 NOTE — Op Note (Signed)
OPERATIVE REPORT- TOTAL HIP ARTHROPLASTY   PREOPERATIVE DIAGNOSIS: Osteoarthritis of the Left hip.   POSTOPERATIVE DIAGNOSIS: Osteoarthritis of the Left  hip.   PROCEDURE: Left total hip arthroplasty, anterior approach.   SURGEON: Gaynelle Arabian, MD   ASSISTANT: Molli Barrows, PA-C  ANESTHESIA:  General  ESTIMATED BLOOD LOSS:-300 mL    DRAINS: Hemovac x1.   COMPLICATIONS: None   CONDITION: PACU - hemodynamically stable.   BRIEF CLINICAL NOTE: Marissa Turner is a 78 y.o. female who has advanced end-  stage arthritis of their Left  hip with progressively worsening pain and  dysfunction.The patient has failed nonoperative management and presents for  total hip arthroplasty.   PROCEDURE IN DETAIL: After successful administration of spinal  anesthetic, the traction boots for the [facility] bed were placed on both  feet and the patient was placed onto the [facility] bed, boots placed into the leg  holders. The Left hip was then isolated from the perineum with plastic  drapes and prepped and draped in the usual sterile fashion. ASIS and  greater trochanter were marked and a oblique incision was made, starting  at about 1 cm lateral and 2 cm distal to the ASIS and coursing towards  the anterior cortex of the femur. The skin was cut with a 10 blade  through subcutaneous tissue to the level of the fascia overlying the  tensor fascia lata muscle. The fascia was then incised in line with the  incision at the junction of the anterior third and posterior 2/3rd. The  muscle was teased off the fascia and then the interval between the TFL  and the rectus was developed. The Hohmann retractor was then placed at  the top of the femoral neck over the capsule. The vessels overlying the  capsule were cauterized and the fat on top of the capsule was removed.  A Hohmann retractor was then placed anterior underneath the rectus  femoris to give exposure to the entire anterior capsule. A T-shaped   capsulotomy was performed. The edges were tagged and the femoral head  was identified.       Osteophytes are removed off the superior acetabulum.  The femoral neck was then cut in situ with an oscillating saw. Traction  was then applied to the left lower extremity utilizing the [facility]  traction. The femoral head was then removed. It was noted that there was a small crack in the calcar  Posteriorly above the level of the lesser trochanter. This would later be fixed with cerclage cables. Retractors were placed  around the acetabulum and then circumferential removal of the labrum was  performed. Osteophytes were also removed. Reaming starts at 45 mm to  medialize and  Increased in 2 mm increments to 47 mm. We reamed in  approximately 40 degrees of abduction, 20 degrees anteversion. A 48 mm  pinnacle acetabular shell was then impacted in anatomic position under  fluoroscopic guidance with excellent purchase. We did not need to place  any additional dome screws. A 28 mm neutral +4 marathon liner was then  placed into the acetabular shell.       The femoral lift was then placed along the lateral aspect of the femur  just distal to the vastus ridge. The leg was  externally rotated and capsule  was stripped off the inferior aspect of the femoral neck down to the  level of the lesser trochanter, this was done with electrocautery. The femur was lifted after this was performed.  The  leg was then placed in an extended and adducted position essentially delivering the femur. We also removed the capsule superiorly and the piriformis from the piriformis fossa to gain excellent exposure of the  proximal femur. Rongeur was used to remove some cancellous bone to get  into the lateral portion of the proximal femur for placement of the  initial starter reamer. The starter broaches was placed  the starter broach  and was shown to go down the center of the canal. Broaching  with the Actis system was then performed  starting at size 0  coursing  Up to size 6. A size 6 had excellent torsional and rotational  and axial stability. The trial high offset neck was then placed  with a 28 + 5 trial head. The hip was then reduced. We confirmed that  the stem was in the canal both on AP and lateral x-rays. It also has excellent sizing. The hip was reduced with outstanding stability through full extension and full external rotation.. AP pelvis was taken and the leg lengths were measured and found to be equal. Hip was then dislocated again and the femoral head and neck removed. The  femoral broach was removed. Size 6 Actis stem with a high offset  neck was then impacted into the femur following native anteversion. Has  excellent purchase in the canal. Excellent torsional and rotational and  axial stability. It is confirmed to be in the canal on AP and lateral  fluoroscopic views. The 28 + 5 metal head was placed and the hip  reduced with outstanding stability. I then passed a  Zimmer cable around the proximal femur at the level of the calcar split and  tightened it to secure the calcar fracture. Again AP pelvis was taken and it  confirmed that the leg lengths were equal. AP views are taken at multiple rotational angles to confirm that  the stem and femur were moving as a solitary unit.The wound was then copiously  irrigated with saline solution and the capsule reattached and repaired  with Ethibond suture. 30 ml of .25% Bupivicaine was  injected into the capsule and into the edge of the tensor fascia lata as well as subcutaneous tissue. The fascia overlying the tensor fascia lata was then closed with a running #1 V-Loc. Subcu was closed with interrupted 2-0 Vicryl and subcuticular running 4-0 Monocryl. Incision was cleaned  and dried. Steri-Strips and a bulky sterile dressing applied. Hemovac  drain was hooked to suction and then the patient was awakened and transported to  recovery in stable condition.        Please  note that a surgical assistant was a medical necessity for this procedure to perform it in a safe and expeditious manner. Assistant was necessary to provide appropriate retraction of vital neurovascular structures and to prevent femoral fracture and allow for anatomic placement of the prosthesis.  Gaynelle Arabian, M.D.

## 2018-10-18 NOTE — Care Plan (Signed)
Ortho Bundle Case Management Note  Patient Details  Name: Marissa Turner MRN: [id number] Date of Birth: 1940/10/18  S/P L THA on 10-18-18 DCP:  Home with spouse and granddtr.  1 story home with 5 ste.  DME:  Has a RW.  3-in-1 ordered through [place] PT:  HEP                   DME Arranged:  3-N-1 DME Agency:  [employer]  HH Arranged:  NA [place] Agency:  NA  Additional Comments: Please contact me with any questions of if this plan should need to change.  Marianne Sofia, RN,CCM [facility]  [phone] 10/18/2018, 5:23 PM

## 2018-10-19 ENCOUNTER — Other Ambulatory Visit: Payer: Self-pay

## 2018-10-19 DIAGNOSIS — F419 Anxiety disorder, unspecified: Secondary | ICD-10-CM | POA: Diagnosis not present

## 2018-10-19 DIAGNOSIS — F329 Major depressive disorder, single episode, unspecified: Secondary | ICD-10-CM | POA: Diagnosis not present

## 2018-10-19 DIAGNOSIS — J449 Chronic obstructive pulmonary disease, unspecified: Secondary | ICD-10-CM | POA: Diagnosis not present

## 2018-10-19 DIAGNOSIS — I10 Essential (primary) hypertension: Secondary | ICD-10-CM | POA: Diagnosis not present

## 2018-10-19 DIAGNOSIS — I482 Chronic atrial fibrillation, unspecified: Secondary | ICD-10-CM | POA: Diagnosis not present

## 2018-10-19 DIAGNOSIS — M1612 Unilateral primary osteoarthritis, left hip: Secondary | ICD-10-CM | POA: Diagnosis not present

## 2018-10-19 LAB — CBC
HCT: 32.6 % — ABNORMAL LOW (ref 36.0–46.0)
Hemoglobin: 9.8 g/dL — ABNORMAL LOW (ref 12.0–15.0)
MCH: 26.3 pg (ref 26.0–34.0)
MCHC: 30.1 g/dL (ref 30.0–36.0)
MCV: 87.4 fL (ref 80.0–100.0)
Platelets: 314 10*3/uL (ref 150–400)
RBC: 3.73 MIL/uL — ABNORMAL LOW (ref 3.87–5.11)
RDW: 15.8 % — ABNORMAL HIGH (ref 11.5–15.5)
WBC: 15.3 10*3/uL — ABNORMAL HIGH (ref 4.0–10.5)
nRBC: 0 % (ref 0.0–0.2)

## 2018-10-19 LAB — BASIC METABOLIC PANEL
Anion gap: 10 (ref 5–15)
BUN: 15 mg/dL (ref 8–23)
CO2: 25 mmol/L (ref 22–32)
Calcium: 8.2 mg/dL — ABNORMAL LOW (ref 8.9–10.3)
Chloride: 100 mmol/L (ref 98–111)
Creatinine, Ser: 0.59 mg/dL (ref 0.44–1.00)
GFR calc Af Amer: >60 mL/min (ref >60)
GFR calc non Af Amer: >60 mL/min (ref >60)
Glucose, Bld: 167 mg/dL — ABNORMAL HIGH (ref 70–99)
Potassium: 4.4 mmol/L (ref 3.5–5.1)
Sodium: 135 mmol/L (ref 135–145)

## 2018-10-19 LAB — PROTIME-INR
INR: 1 (ref 0.8–1.2)
Prothrombin Time: 13.4 seconds (ref 11.4–15.2)

## 2018-10-19 MED ORDER — METHOCARBAMOL 500 MG PO TABS: 500.0000 mg | ORAL_TABLET | Freq: Four times a day (QID) | ORAL | 0 refills | Status: DC | PRN

## 2018-10-19 MED ORDER — WARFARIN SODIUM 5 MG PO TABS: 5.0000 mg | ORAL_TABLET | Freq: Once | ORAL | Status: DC

## 2018-10-19 MED ORDER — ENOXAPARIN SODIUM 40 MG/0.4ML ~~LOC~~ SOLN: 40.0000 mg | SUBCUTANEOUS | 0 refills | Status: DC

## 2018-10-19 MED ORDER — HYDROMORPHONE HCL 2 MG PO TABS: 2.0000 mg | ORAL_TABLET | Freq: Four times a day (QID) | ORAL | 0 refills | Status: DC | PRN

## 2018-10-19 MED ORDER — ENOXAPARIN SODIUM 40 MG/0.4ML ~~LOC~~ SOLN
40.0000 mg | SUBCUTANEOUS | Status: DC
  Administered 2018-10-19: 40 mg via SUBCUTANEOUS
  Filled 2018-10-19: qty 0.4

## 2018-10-19 NOTE — Progress Notes (Signed)
Subjective: 1 Day Post-Op Procedure(s) (LRB): TOTAL HIP ARTHROPLASTY ANTERIOR APPROACH (Left) Patient reports pain as mild.   Patient seen in rounds by Dr. Wynelle Link. Patient is well, and has had no acute complaints or problems other than discomfort in the left hip. No acute events overnight. Foley catheter removed, positive flatus. Patient states she is ready to go home today.  We will start therapy today.   Objective: Vital signs in last 24 hours: Temp:  [97.4 F (36.3 C)-98.3 F (36.8 C)] 98.3 F (36.8 C) (09/22 0426) Pulse Rate:  [57-132] 72 (09/22 0426) Resp:  [8-18] 18 (09/22 0426) BP: (96-154)/(61-103) 96/61 (09/22 0953) SpO2:  [91 %-100 %] 95 % (09/22 0426) FiO2 (%):  [2 %] 2 % (09/21 1837) Weight:  [61 kg] 61 kg (09/22 0439)  Intake/Output from previous day:  Intake/Output Summary (Last 24 hours) at 10/19/2018 1013 Last data filed at 10/19/2018 0900 Gross per 24 hour  Intake 2847.32 ml  Output 2020 ml  Net 827.32 ml     Intake/Output this shift: Total I/O In: 480 [P.O.:480] Out: 600 [Urine:600]  Labs: Recent Labs    10/19/18 0516  HGB 9.8*   Recent Labs    10/19/18 0516  WBC 15.3*  RBC 3.73*  HCT 32.6*  PLT 314   Recent Labs    10/19/18 0516  NA 135  K 4.4  CL 100  CO2 25  BUN 15  CREATININE 0.59  GLUCOSE 167*  CALCIUM 8.2*   Recent Labs    10/19/18 0516  INR 1.0    Exam: General - Patient is Alert and Oriented Extremity - Neurologically intact Sensation intact distally Intact pulses distally Dorsiflexion/Plantar flexion intact Dressing - dressing C/D/I Motor Function - intact, moving foot and toes well on exam.   Past Medical History:  Diagnosis Date  . Atrial fibrillation, chronic 2004   left atrial thrombus in 2004; near syncope in 2008; echocardiogram in 2008-normal EF, mild left atrial enlargement, no valvular abnormalities, lipomatous hypertrophy; no aortic atheroma on TEE in 2004  . Cancer (HCC)    LYMPH NODE    and  lung cancer  . Chronic anticoagulation 2004   warfarin  . COPD (chronic obstructive pulmonary disease) ([place])    denies  . Depression   . DJD (degenerative joint disease), lumbosacral    Also hip  . Dysrhythmia    chronic AFib  . Family history of adverse reaction to anesthesia    sister PONV  . Fracture of wrist    Left  . History of kidney stones    once  . Hypotension   . Panic attacks   . Peptic ulcer disease    s/p Billroth II  . PONV (postoperative nausea and vomiting)   . Tobacco abuse    40 pack years; quit attempt in 2012    Assessment/Plan: 1 Day Post-Op Procedure(s) (LRB): TOTAL HIP ARTHROPLASTY ANTERIOR APPROACH (Left) Principal Problem:   OA (osteoarthritis) of hip  Estimated body mass index is 26.26 kg/m as calculated from the following:   Height as of this encounter: 5' (1.524 m).   Weight as of this encounter: 61 kg. Advance diet Up with therapy  DVT Prophylaxis - Lovenox bridge and Coumadin Weight bearing as tolerated. D/C O2 and pulse ox and try on room air. Hemovac pulled without difficulty, will begin therapy.  Plan is to go Home after hospital stay. Plan for discharge today after 1-2 sessions of therapy as long as she is meeting her goals.  Follow up in the office in 2 weeks.   Griffith Citron, PA-C Orthopedic Surgery 10/19/2018, 10:13 AM

## 2018-10-19 NOTE — Progress Notes (Signed)
Physical Therapy Treatment Patient Details Name: Marissa Turner MRN: [id number] DOB: 09/12/1940 Today's Date: 10/19/2018    History of Present Illness Pt is a 78 year old female s/p L direct anterior THA with PMHx significant for lung cancer and chronic afib    PT Comments    Pt ambulated again in hallway and practiced safe stair technique.  Spouse present and did not observe however verbally reviewed safe technique.  Pt and spouse feel ready for pt to d/c home today.    Follow Up Recommendations  Home health PT;Follow surgeon's recommendation for DC plan and follow-up therapies     Equipment Recommendations  Rolling walker with 5" wheels;3in1 (PT)    Recommendations for Other Services       Precautions / Restrictions Precautions Precautions: Fall Restrictions Weight Bearing Restrictions: No Other Position/Activity Restrictions: WBAT    Mobility  Bed Mobility Overal bed mobility: Needs Assistance Bed Mobility: Supine to Sit     Supine to sit: Min assist     General bed mobility comments: pt up in recliner  Transfers Overall transfer level: Needs assistance Equipment used: Rolling walker (2 wheeled) Transfers: Sit to/from Stand Sit to Stand: Min guard         General transfer comment: verbal cues for UE and LE positioning  Ambulation/Gait Ambulation/Gait assistance: Min guard Gait Distance (Feet): 80 Feet Assistive device: Rolling walker (2 wheeled) Gait Pattern/deviations: Step-through pattern;Decreased stance time - left;Antalgic;Trunk flexed;Narrow base of support     General Gait Details: verbal cues for sequence, RW positioning, step length, posture, heel strike   Stairs Stairs: Yes Stairs assistance: Min guard Stair Management: Step to pattern;Forwards;One rail Left;With cane Number of Stairs: 3 General stair comments: verbal cues for sequencing and safety, reviewed with spouse as well, pt performed twice and reports  understanding   Wheelchair Mobility    Modified Rankin (Stroke Patients Only)       Balance                                            Cognition Arousal/Alertness: Awake/alert Behavior During Therapy: WFL for tasks assessed/performed Overall Cognitive Status: Within Functional Limits for tasks assessed                                        Exercises     General Comments        Pertinent Vitals/Pain Pain Assessment: 0-10 Pain Score: 3  Pain Location: L hip Pain Descriptors / Indicators: Aching;Sore Pain Intervention(s): Monitored during session;Repositioned    Home Living Family/patient expects to be discharged to:: Private residence Living Arrangements: Spouse/significant other   Type of Home: House Home Access: Stairs to enter Entrance Stairs-Rails: Right Home Layout: Two level;Able to live on main level with bedroom/bathroom Home Equipment: Walker - 2 wheels      Prior Function Level of Independence: Independent          PT Goals (current goals can now be found in the care plan section) Acute Rehab PT Goals PT Goal Formulation: With patient Time For Goal Achievement: 10/26/18 Potential to Achieve Goals: Good Progress towards PT goals: Progressing toward goals    Frequency    7X/week      PT Plan Current plan remains appropriate    Co-evaluation  AM-PAC PT "6 Clicks" Mobility   Outcome Measure  Help needed turning from your back to your side while in a flat bed without using bedrails?: A Little Help needed moving from lying on your back to sitting on the side of a flat bed without using bedrails?: A Little Help needed moving to and from a bed to a chair (including a wheelchair)?: A Little Help needed standing up from a chair using your arms (e.g., wheelchair or bedside chair)?: A Little Help needed to walk in hospital room?: A Little Help needed climbing 3-5 steps with a railing? : A  Little 6 Click Score: 18    End of Session Equipment Utilized During Treatment: Gait belt Activity Tolerance: Patient tolerated treatment well Patient left: with call bell/phone within reach;in chair;with family/visitor present Nurse Communication: Mobility status PT Visit Diagnosis: Other abnormalities of gait and mobility (R26.89)     Time: 7289-7915 PT Time Calculation (min) (ACUTE ONLY): 21 min  Charges:  $Gait Training: 8-22 mins                     Carmelia Bake, PT, DPT Acute Rehabilitation Services Office: [phone] Pager: [place] 10/19/2018, 4:30 PM

## 2018-10-19 NOTE — Evaluation (Signed)
Physical Therapy Evaluation Patient Details Name: Marissa Turner MRN: [id number] DOB: October 24, 1940 Today's Date: 10/19/2018   History of Present Illness  Pt is a 78 year old female s/p L direct anterior THA with PMHx significant for lung cancer and chronic afib  Clinical Impression  Pt is s/p THA resulting in the deficits listed below (see PT Problem List).  Pt will benefit from skilled PT to increase their independence and safety with mobility to allow discharge to the venue listed below.  Pt ambulated in hallway and performed LE exercises.  Pt provided with HEP handout.  Pt plans to d/c home possibly later today so will return to review safe stair technique prior to d/c.     Follow Up Recommendations Home health PT;Follow surgeon's recommendation for DC plan and follow-up therapies    Equipment Recommendations  Rolling walker with 5" wheels;3in1 (PT)    Recommendations for Other Services       Precautions / Restrictions Precautions Precautions: Fall Restrictions Weight Bearing Restrictions: No Other Position/Activity Restrictions: WBAT      Mobility  Bed Mobility Overal bed mobility: Needs Assistance Bed Mobility: Supine to Sit     Supine to sit: Min assist     General bed mobility comments: assist for L LE  Transfers Overall transfer level: Needs assistance Equipment used: Rolling walker (2 wheeled) Transfers: Sit to/from Stand Sit to Stand: Min assist         General transfer comment: assist to rise and steady, cues for UE and LE positioning  Ambulation/Gait Ambulation/Gait assistance: Min assist Gait Distance (Feet): 40 Feet Assistive device: Rolling walker (2 wheeled) Gait Pattern/deviations: Step-to pattern;Step-through pattern;Decreased stance time - left;Antalgic;Trunk flexed;Narrow base of support     General Gait Details: verbal cues for sequence, RW positioning, step length, posture, heel strike  Stairs            Wheelchair Mobility     Modified Rankin (Stroke Patients Only)       Balance                                             Pertinent Vitals/Pain Pain Assessment: 0-10 Pain Score: 3  Pain Location: L hip Pain Descriptors / Indicators: Aching;Sore Pain Intervention(s): Limited activity within patient's tolerance;Monitored during session;Repositioned    Home Living Family/patient expects to be discharged to:: Private residence Living Arrangements: Spouse/significant other   Type of Home: House Home Access: Stairs to enter Entrance Stairs-Rails: Right Entrance Stairs-Number of Steps: 5 Home Layout: Two level;Able to live on main level with bedroom/bathroom Home Equipment: Walker - 2 wheels      Prior Function Level of Independence: Independent               Hand Dominance        Extremity/Trunk Assessment        Lower Extremity Assessment Lower Extremity Assessment: LLE deficits/detail LLE Deficits / Details: anticipated left hip post op weakness, able to perform ankle pumps, good quad contraction       Communication   Communication: No difficulties  Cognition Arousal/Alertness: Awake/alert Behavior During Therapy: WFL for tasks assessed/performed Overall Cognitive Status: Within Functional Limits for tasks assessed  General Comments      Exercises Total Joint Exercises Ankle Circles/Pumps: AROM;10 reps;Both Quad Sets: AROM;Both;10 reps Heel Slides: AROM;Left;10 reps Hip ABduction/ADduction: AAROM;Left;10 reps Long Arc Quad: AROM;Left;10 reps;Seated   Assessment/Plan    PT Assessment Patient needs continued PT services  PT Problem List Decreased strength;Decreased range of motion;Decreased balance;Decreased knowledge of use of DME;Decreased mobility;Pain;Decreased activity tolerance       PT Treatment Interventions Stair training;Gait training;DME instruction;Therapeutic exercise;Balance  training;Functional mobility training;Therapeutic activities;Patient/family education    PT Goals (Current goals can be found in the Care Plan section)  Acute Rehab PT Goals PT Goal Formulation: With patient Time For Goal Achievement: 10/26/18 Potential to Achieve Goals: Good    Frequency 7X/week   Barriers to discharge        Co-evaluation               AM-PAC PT "6 Clicks" Mobility  Outcome Measure Help needed turning from your back to your side while in a flat bed without using bedrails?: A Little Help needed moving from lying on your back to sitting on the side of a flat bed without using bedrails?: A Little Help needed moving to and from a bed to a chair (including a wheelchair)?: A Little Help needed standing up from a chair using your arms (e.g., wheelchair or bedside chair)?: A Little Help needed to walk in hospital room?: A Little Help needed climbing 3-5 steps with a railing? : A Lot 6 Click Score: 17    End of Session   Activity Tolerance: Patient tolerated treatment well Patient left: with call bell/phone within reach;in chair(pt agreeable to not get out of chair without assist)   PT Visit Diagnosis: Other abnormalities of gait and mobility (R26.89)    Time: 4239-5320 PT Time Calculation (min) (ACUTE ONLY): 27 min   Charges:   PT Evaluation $PT Eval Low Complexity: 1 Low PT Treatments $Therapeutic Exercise: 8-22 mins        Carmelia Bake, PT, DPT Acute Rehabilitation Services Office: [phone] Pager: [phone]  Trena Platt 10/19/2018, 1:06 PM

## 2018-10-19 NOTE — Progress Notes (Signed)
[place] for warfarin Indication: atrial fibrillation  Allergies  Allergen Reactions  . Estrogens Swelling    edema  . Codeine Swelling and Rash  . Penicillins Other (See Comments)    Break out in welts. Did it involve swelling of the face/tongue/throat, SOB, or low BP? No Did it involve sudden or severe rash/hives, skin peeling, or any reaction on the inside of your mouth or nose? No Did you need to seek medical attention at a hospital or doctor's office? No When did it last happen? Childhood allergy If all above answers are "NO", may proceed with cephalosporin use.   . Diphenhydramine Hcl Other (See Comments)    hallucinations     Patient Measurements: Height: 5' (152.4 cm) Weight: 134 lb 7.7 oz (61 kg) IBW/kg (Calculated) : 45.5  Vital Signs: Temp: 98.3 F (36.8 C) (09/22 0426) Temp Source: Oral (09/22 0426) BP: 96/61 (09/22 0953) Pulse Rate: 72 (09/22 0426)  Labs: Recent Labs    10/19/18 0516  HGB 9.8*  HCT 32.6*  PLT 314  LABPROT 13.4  INR 1.0  CREATININE 0.59    Estimated Creatinine Clearance: 48.1 mL/min (by C-G formula based on SCr of 0.59 mg/dL).   Medical History: Past Medical History:  Diagnosis Date  . Atrial fibrillation, chronic 2004   left atrial thrombus in 2004; near syncope in 2008; echocardiogram in 2008-normal EF, mild left atrial enlargement, no valvular abnormalities, lipomatous hypertrophy; no aortic atheroma on TEE in 2004  . Cancer (HCC)    LYMPH NODE    and lung cancer  . Chronic anticoagulation 2004   warfarin  . COPD (chronic obstructive pulmonary disease) ([place])    denies  . Depression   . DJD (degenerative joint disease), lumbosacral    Also hip  . Dysrhythmia    chronic AFib  . Family history of adverse reaction to anesthesia    sister PONV  . Fracture of wrist    Left  . History of kidney stones    once  . Hypotension   . Panic attacks   . Peptic ulcer disease    s/p  Billroth II  . PONV (postoperative nausea and vomiting)   . Tobacco abuse    40 pack years; quit attempt in 2012    Assessment: 59 yoF with PMH Afib on warfarin now s/p L THA; pharmacy to continue warfarin while admitted   Baseline INR 1.2 after warfarin washout  Prior anticoagulation: warfarin 2.5mg  on M/W/F, 5mg  on all other days of the week (LD 9/16)  Today, 10/19/2018:  CBC: Hgb decreased to 9.8 post-op, Pltc WNL   INR 1, subtherapeutic as expected  Major drug interactions: none  No bleeding issues per nursing  Clear liquid diet ordered  Enoxaparin 40mg  SQ q24h added per Ortho   Goal of Therapy: INR 2-3  Plan:  Warfarin 5mg  PO x 1 today   Daily PT/INR  Continue Enoxaparin until INR therapeutic   CBC at least q72 hr while on warfarin  Monitor for s/sx of bleeding    Lindell Spar, PharmD, BCPS Clinical Pharmacist  10/19/2018, 11:56 AM

## 2018-10-19 NOTE — TOC Transition Note (Signed)
Transition of Care [facility]) - CM/SW Discharge Note   Patient Details  Name: Marissa Turner MRN: [id number] Date of Birth: [birth year]  [facility] CM/SW Contact:  Dessa Phi, RN Phone Number: 10/19/2018, 10:56 AM   Clinical Narrative:  Case managed by ortho office CM. See prior note. No CM needs.           Patient Goals and CMS Choice        Discharge Placement                       Discharge Plan and Services                DME Arranged: 3-N-1 DME Agency: [facility]       HH Arranged: NA HH Agency: NA        Social Determinants of Health (SDOH) Interventions     Readmission Risk Interventions No flowsheet data found.

## 2018-10-19 NOTE — Care Management Obs Status (Signed)
[facility] NOTIFICATION   Patient Details  Name: Marissa Turner MRN: [id number] Date of Birth: [birth year]   Medicare Observation Status Notification Given:  Yes    MahabirJuliann Pulse, RN 10/19/2018, 10:55 AM

## 2018-10-19 NOTE — Care Management CC44 (Signed)
Condition Code 44 Documentation Completed  Patient Details  Name: Marissa Turner MRN: [id number] Date of Birth: 1940-02-26   Condition Code 44 given:  Yes Patient signature on Condition Code 44 notice:  Yes Documentation of 2 MD's agreement:  Yes Code 44 added to claim:  Yes    Dessa Phi, RN 10/19/2018, 10:55 AM

## 2018-10-19 NOTE — Progress Notes (Signed)
Pt IV removed. All discharge instructions reviewed with patient and patients husband. All questions answered. Pt educated on how to self administer Lovenox at home and vocalized understanding. Discharged with all patient belongings.

## 2018-10-20 DIAGNOSIS — I10 Essential (primary) hypertension: Secondary | ICD-10-CM | POA: Diagnosis not present

## 2018-10-20 DIAGNOSIS — Z7901 Long term (current) use of anticoagulants: Secondary | ICD-10-CM | POA: Diagnosis not present

## 2018-10-20 DIAGNOSIS — D509 Iron deficiency anemia, unspecified: Secondary | ICD-10-CM | POA: Diagnosis not present

## 2018-10-20 DIAGNOSIS — M1990 Unspecified osteoarthritis, unspecified site: Secondary | ICD-10-CM | POA: Diagnosis not present

## 2018-10-20 DIAGNOSIS — F411 Generalized anxiety disorder: Secondary | ICD-10-CM | POA: Diagnosis not present

## 2018-10-20 DIAGNOSIS — R7301 Impaired fasting glucose: Secondary | ICD-10-CM | POA: Diagnosis not present

## 2018-10-20 DIAGNOSIS — F17211 Nicotine dependence, cigarettes, in remission: Secondary | ICD-10-CM | POA: Diagnosis not present

## 2018-10-20 DIAGNOSIS — Z720 Tobacco use: Secondary | ICD-10-CM | POA: Diagnosis not present

## 2018-10-20 DIAGNOSIS — I482 Chronic atrial fibrillation, unspecified: Secondary | ICD-10-CM | POA: Diagnosis not present

## 2018-10-20 NOTE — Discharge Summary (Signed)
Physician Discharge Summary   Patient ID: Marissa Turner MRN: [id number] DOB/AGE: 08/17/1940 78 y.o.  Admit date: 10/18/2018 Discharge date: 10/19/2018  Primary Diagnosis: Osteoarthritis of the Left  hip.  Admission Diagnoses:  Past Medical History:  Diagnosis Date   Atrial fibrillation, chronic 2004   left atrial thrombus in 2004; near syncope in 2008; echocardiogram in 2008-normal EF, mild left atrial enlargement, no valvular abnormalities, lipomatous hypertrophy; no aortic atheroma on TEE in 2004   Cancer ([place])    LYMPH NODE    and lung cancer   Chronic anticoagulation 2004   warfarin   COPD (chronic obstructive pulmonary disease) ([place])    denies   Depression    DJD (degenerative joint disease), lumbosacral    Also hip   Dysrhythmia    chronic AFib   Family history of adverse reaction to anesthesia    sister PONV   Fracture of wrist    Left   History of kidney stones    once   Hypotension    Panic attacks    Peptic ulcer disease    s/p Billroth II   PONV (postoperative nausea and vomiting)    Tobacco abuse    40 pack years; quit attempt in 2012   Discharge Diagnoses:   Principal Problem:   OA (osteoarthritis) of hip  Estimated body mass index is 26.26 kg/m as calculated from the following:   Height as of this encounter: 5' (1.524 m).   Weight as of this encounter: 61 kg.  Procedure:  Procedure(s) (LRB): TOTAL HIP ARTHROPLASTY ANTERIOR APPROACH (Left)   Consults: pharmacy  HPI: Marissa Turner is a 78 y.o. female who has advanced end-  stage arthritis of their Left  hip with progressively worsening pain and  dysfunction.The patient has failed nonoperative management and presents for  total hip arthroplasty.   Laboratory Data: Admission on 10/18/2018, Discharged on 10/19/2018  Component Date Value Ref Range Status   WBC 10/19/2018 15.3* 4.0 - 10.5 K/uL Final   RBC 10/19/2018 3.73* 3.87 - 5.11 MIL/uL Final   Hemoglobin 10/19/2018 9.8*  12.0 - 15.0 g/dL Final   HCT 10/19/2018 32.6* 36.0 - 46.0 % Final   MCV 10/19/2018 87.4  80.0 - 100.0 fL Final   MCH 10/19/2018 26.3  26.0 - 34.0 pg Final   MCHC 10/19/2018 30.1  30.0 - 36.0 g/dL Final   RDW 10/19/2018 15.8* 11.5 - 15.5 % Final   Platelets 10/19/2018 314  150 - 400 K/uL Final   nRBC 10/19/2018 0.0  0.0 - 0.2 % Final   Performed at [facility], [place] [address] [place], [place] [address]   Sodium 10/19/2018 135  135 - 145 mmol/L Final   Potassium 10/19/2018 4.4  3.5 - 5.1 mmol/L Final   Chloride 10/19/2018 100  98 - 111 mmol/L Final   CO2 10/19/2018 25  22 - 32 mmol/L Final   Glucose, Bld 10/19/2018 167* 70 - 99 mg/dL Final   BUN 10/19/2018 15  8 - 23 mg/dL Final   Creatinine, Ser 10/19/2018 0.59  0.44 - 1.00 mg/dL Final   Calcium 10/19/2018 8.2* 8.9 - 10.3 mg/dL Final   GFR calc non Af Amer 10/19/2018 >60  >60 mL/min Final   GFR calc Af Amer 10/19/2018 >60  >60 mL/min Final   Anion gap 10/19/2018 10  5 - 15 Final   Performed at [facility], [place] [address] [place], [place] [address]   Prothrombin Time 10/19/2018 13.4  11.4 -  15.2 seconds Final   INR 10/19/2018 1.0  0.8 - 1.2 Final   Comment: (NOTE) INR goal varies based on device and disease states. Performed at [facility], [place] [address] [place], [place] [address]   Hospital Outpatient Visit on 10/15/2018  Component Date Value Ref Range Status   aPTT 10/15/2018 30  24 - 36 seconds Final   Performed at [facility], [place] [address], [place], [place] [address]   WBC 10/15/2018 11.3* 4.0 - 10.5 K/uL Final   RBC 10/15/2018 4.83  3.87 - 5.11 MIL/uL Final   Hemoglobin 10/15/2018 12.7  12.0 - 15.0 g/dL Final   HCT 10/15/2018 41.7  36.0 - 46.0 % Final   MCV 10/15/2018 86.3  80.0 - 100.0 fL Final   MCH 10/15/2018 26.3  26.0 - 34.0 pg Final   MCHC 10/15/2018 30.5  30.0 - 36.0 g/dL Final   RDW 10/15/2018 15.8* 11.5  - 15.5 % Final   Platelets 10/15/2018 426* 150 - 400 K/uL Final   nRBC 10/15/2018 0.0  0.0 - 0.2 % Final   Performed at [facility], [place] [address], [place], [place] [address]   Sodium 10/15/2018 141  135 - 145 mmol/L Final   Potassium 10/15/2018 4.4  3.5 - 5.1 mmol/L Final   Chloride 10/15/2018 103  98 - 111 mmol/L Final   CO2 10/15/2018 30  22 - 32 mmol/L Final   Glucose, Bld 10/15/2018 100* 70 - 99 mg/dL Final   BUN 10/15/2018 19  8 - 23 mg/dL Final   Creatinine, Ser 10/15/2018 0.68  0.44 - 1.00 mg/dL Final   Calcium 10/15/2018 8.9  8.9 - 10.3 mg/dL Final   Total Protein 10/15/2018 7.6  6.5 - 8.1 g/dL Final   Albumin 10/15/2018 4.1  3.5 - 5.0 g/dL Final   AST 10/15/2018 24  15 - 41 U/L Final   ALT 10/15/2018 16  0 - 44 U/L Final   Alkaline Phosphatase 10/15/2018 81  38 - 126 U/L Final   Total Bilirubin 10/15/2018 0.4  0.3 - 1.2 mg/dL Final   GFR calc non Af Amer 10/15/2018 >60  >60 mL/min Final   GFR calc Af Amer 10/15/2018 >60  >60 mL/min Final   Anion gap 10/15/2018 8  5 - 15 Final   Performed at [facility], [place] [address] [place], [place] [address]   Prothrombin Time 10/15/2018 15.2  11.4 - 15.2 seconds Final   INR 10/15/2018 1.2  0.8 - 1.2 Final   Comment: (NOTE) INR goal varies based on device and disease states. Performed at [facility], [place] [address], [place], [place] [address]    ABO/RH(D) 10/15/2018 A POS   Final   Antibody Screen 10/15/2018 NEG   Final   Sample Expiration 10/15/2018 10/21/2018,2359   Final   Extend sample reason 10/15/2018    Final                   Value:NO TRANSFUSIONS OR PREGNANCY IN THE PAST 3 MONTHS Performed at [place] [address], [place], [place] [address]    MRSA, PCR 10/15/2018 NEGATIVE  NEGATIVE Final   Staphylococcus aureus 10/15/2018 NEGATIVE  NEGATIVE Final   Comment: (NOTE) The Xpert SA Assay (FDA approved for NASAL  specimens in patients 33 years of age and older), is one component of a comprehensive surveillance program. It is not intended to diagnose infection nor to guide or monitor treatment. Performed  at [facility], [place] [address], [place], [place] [address]    ABO/RH(D) 10/15/2018    Final                   Value:A POS Performed at [facility], [place] [address] [place], [place] [address]   Hospital Outpatient Visit on 10/14/2018  Component Date Value Ref Range Status   SARS Coronavirus 2 10/14/2018 NEGATIVE  NEGATIVE Final   Comment: (NOTE) SARS-CoV-2 target nucleic acids are NOT DETECTED. The SARS-CoV-2 RNA is generally detectable in upper and lower respiratory specimens during the acute phase of infection. Negative results do not preclude SARS-CoV-2 infection, do not rule out co-infections with other pathogens, and should not be used as the sole basis for treatment or other patient management decisions. Negative results must be combined with clinical observations, patient history, and epidemiological information. The expected result is Negative. Fact Sheet for Patients: SugarRoll.be Fact Sheet for Healthcare Providers: https://www.woods-mathews.com/ This test is not yet approved or cleared by the [place] FDA and  has been authorized for detection and/or diagnosis of SARS-CoV-2 by FDA under an Emergency Use Authorization (EUA). This EUA will remain  in effect (meaning this test can be used) for the duration of the COVID-19 declaration under Section 56                          4(b)(1) of the Act, 21 U.S.C. section 360bbb-3(b)(1), unless the authorization is terminated or revoked sooner. Performed at [facility], [place] [address] [place], [place] [address]   Anti-coag visit on 09/29/2018  Component Date Value Ref Range Status   INR 09/29/2018 1.8* 2.0 - 3.0 Final  Anti-coag visit on  09/02/2018  Component Date Value Ref Range Status   INR 09/02/2018 1.8* 2.0 - 3.0 Final     X-Rays:Dg Pelvis Portable  Result Date: 10/18/2018 CLINICAL DATA:  Postop left hip replacement EXAM: PORTABLE PELVIS 1-2 VIEWS COMPARISON:  Earlier same day FINDINGS: AP pelvis shows total hip replacement on the left. Fracture lines are evident within the greater trochanter of the femur. Soft tissue drain is in place. IMPRESSION: Left total hip replacement. Fracture lines visible in the greater trochanter of the femur. Soft tissue drain in place. Electronically Signed   By: Nelson Chimes M.D.   On: 10/18/2018 16:11   Dg C-arm 1-60 Min-no Report  Result Date: 10/18/2018 Fluoroscopy was utilized by the requesting physician.  No radiographic interpretation.   Dg Hip Operative Unilat W Or W/o Pelvis Left  Result Date: 10/18/2018 CLINICAL DATA:  Left hip total arthroplasty. EXAM: OPERATIVE LEFT HIP (WITH PELVIS IF PERFORMED) 9 VIEWS TECHNIQUE: Fluoroscopic spot image(s) were submitted for interpretation post-operatively. COMPARISON:  Left hip radiographs 03/06/2017. 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 No gross abnormality of the right femoral head identified. Subsequent images demonstrate the sequential performance of a left total hip arthroplasty. On the final images, the hardware is well positioned. There is a proximal left femoral cerclage wire. No evidence of acute fracture or dislocation. There is some gas in the soft tissue surrounding the left hip. IMPRESSION: Intraoperative views during left total hip arthroplasty. No demonstrated complication. Electronically Signed   By: Richardean Sale M.D.   On: 10/18/2018 15:57    EKG: Orders placed or performed during the hospital encounter of 04/13/18  EKG 12 lead   EKG 12 lead     Hospital Course: Marissa Turner is a 78 y.o.  who was admitted to [facility]. They were brought to the operating room on 10/18/2018 and underwent Procedure(s): [place].  Patient tolerated the procedure well and was later transferred to the recovery room and then to the orthopaedic floor for postoperative care. They were given PO and IV analgesics for pain control following their surgery. They were given 24 hours of postoperative antibiotics of  Anti-infectives (From admission, onward)   Start     Dose/Rate Route Frequency Ordered Stop   10/18/18 2000  ceFAZolin (ANCEF) IVPB 1 g/50 mL premix     1 g 100 mL/hr over 30 Minutes Intravenous Every 6 hours 10/18/18 1844 10/19/18 0248   10/18/18 1139  ceFAZolin (ANCEF) 2-4 GM/100ML-% IVPB    Note to Pharmacy: Karsten Ro   : cabinet override      10/18/18 1139 10/18/18 1339   10/18/18 1130  ceFAZolin (ANCEF) IVPB 2g/100 mL premix     2 g 200 mL/hr over 30 Minutes Intravenous On call to O.R. 10/18/18 1126 10/18/18 1409     and started on DVT prophylaxis in the form of Lovenox and Coumadin.   PT and OT were ordered for total joint protocol. Discharge planning consulted to help with postop disposition and equipment needs.  Patient had a good night on the evening of surgery. They started to get up OOB with therapy on POD #1. Pt was seen during rounds and was ready to go home pending progress with therapy. Hemovac drain was pulled without difficulty. She worked with therapy on POD #1 and was meeting her goals. Pt was discharged to home later that day in stable condition.  Diet: Regular diet Activity: WBAT Follow-up: in 2 weeks Disposition: Home Discharged Condition: good   Discharge Instructions    Call MD / Call 911   Complete by: As directed    If you experience chest pain or shortness of breath, CALL 911 and be transported to the hospital emergency room.  If you develope a fever above 101 F, pus (white drainage) or increased drainage or redness at the  wound, or calf pain, call your surgeon's office.   Change dressing   Complete by: As directed    You may change your dressing on Wednesday, then change the dressing daily with sterile 4 x 4 inch gauze dressing and paper tape.   Constipation Prevention   Complete by: As directed    Drink plenty of fluids.  Prune juice may be helpful.  You may use a stool softener, such as Colace (over the counter) 100 mg twice a day.  Use MiraLax (over the counter) for constipation as needed.   Diet - low sodium heart healthy   Complete by: As directed    Discharge instructions   Complete by: As directed    Dr. Gaynelle Arabian Total Joint Specialist [facility] [address] [place], [place] [address] [phone]  ANTERIOR APPROACH TOTAL HIP REPLACEMENT POSTOPERATIVE DIRECTIONS   Hip Rehabilitation, Guidelines Following Surgery  The results of a hip operation are greatly improved after range of motion and muscle strengthening exercises. Follow all safety measures which are given to protect your hip. If any of these exercises cause increased pain or swelling in your joint, decrease the amount until you are comfortable again. Then slowly increase the exercises. Call your caregiver if you have problems or  questions.   HOME CARE INSTRUCTIONS  Remove items at home which could result in a fall. This includes throw rugs or furniture in walking pathways.  ICE to the affected hip every three hours for 30 minutes at a time and then as needed for pain and swelling.  Continue to use ice on the hip for pain and swelling from surgery. You may notice swelling that will progress down to the foot and ankle.  This is normal after surgery.  Elevate the leg when you are not up walking on it.   Continue to use the breathing machine which will help keep your temperature down.  It is common for your temperature to cycle up and down following surgery, especially at night when you are not up moving around and exerting  yourself.  The breathing machine keeps your lungs expanded and your temperature down.  DIET You may resume your previous home diet once your are discharged from the hospital.  DRESSING / WOUND CARE / SHOWERING You may change your dressing 3-5 days after surgery.  Then change the dressing every day with sterile gauze.  Please use good hand washing techniques before changing the dressing.  Do not use any lotions or creams on the incision until instructed by your surgeon. You may start showering once you are discharged home but do not submerge the incision under water. Just pat the incision dry and apply a dry gauze dressing on daily. Change the surgical dressing daily and reapply a dry dressing each time.  ACTIVITY Walk with your walker as instructed. Use walker as long as suggested by your caregivers. Avoid periods of inactivity such as sitting longer than an hour when not asleep. This helps prevent blood clots.  You may resume a sexual relationship in one month or when given the OK by your doctor.  You may return to work once you are cleared by your doctor.  Do not drive a car for 6 weeks or until released by you surgeon.  Do not drive while taking narcotics.  WEIGHT BEARING Weight bearing as tolerated with assist device (walker, cane, etc) as directed, use it as long as suggested by your surgeon or therapist, typically at least 4-6 weeks.  POSTOPERATIVE CONSTIPATION PROTOCOL Constipation - defined medically as fewer than three stools per week and severe constipation as less than one stool per week.  One of the most common issues patients have following surgery is constipation.  Even if you have a regular bowel pattern at home, your normal regimen is likely to be disrupted due to multiple reasons following surgery.  Combination of anesthesia, postoperative narcotics, change in appetite and fluid intake all can affect your bowels.  In order to avoid complications following surgery, here are  some recommendations in order to help you during your recovery period.  Colace (docusate) - Pick up an over-the-counter form of Colace or another stool softener and take twice a day as long as you are requiring postoperative pain medications.  Take with a full glass of water daily.  If you experience loose stools or diarrhea, hold the colace until you stool forms back up.  If your symptoms do not get better within 1 week or if they get worse, check with your doctor.  Dulcolax (bisacodyl) - Pick up over-the-counter and take as directed by the product packaging as needed to assist with the movement of your bowels.  Take with a full glass of water.  Use this product as needed if not relieved by  Colace only.   MiraLax (polyethylene glycol) - Pick up over-the-counter to have on hand.  MiraLax is a solution that will increase the amount of water in your bowels to assist with bowel movements.  Take as directed and can mix with a glass of water, juice, soda, coffee, or tea.  Take if you go more than two days without a movement. Do not use MiraLax more than once per day. Call your doctor if you are still constipated or irregular after using this medication for 7 days in a row.  If you continue to have problems with postoperative constipation, please contact the office for further assistance and recommendations.  If you experience "the worst abdominal pain ever" or develop nausea or vomiting, please contact the office immediatly for further recommendations for treatment.  ITCHING  If you experience itching with your medications, try taking only a single pain pill, or even half a pain pill at a time.  You can also use Benadryl over the counter for itching or also to help with sleep.   TED HOSE STOCKINGS Wear the elastic stockings on both legs for three weeks following surgery during the day but you may remove then at night for sleeping.  MEDICATIONS See your medication summary on the "After Visit Summary"  that the nursing staff will review with you prior to discharge.  You may have some home medications which will be placed on hold until you complete the course of blood thinner medication.  It is important for you to complete the blood thinner medication as prescribed by your surgeon.  Continue your approved medications as instructed at time of discharge.  PRECAUTIONS If you experience chest pain or shortness of breath - call 911 immediately for transfer to the hospital emergency department.  If you develop a fever greater that 101 F, purulent drainage from wound, increased redness or drainage from wound, foul odor from the wound/dressing, or calf pain - CONTACT YOUR SURGEON.                                                   FOLLOW-UP APPOINTMENTS Make sure you keep all of your appointments after your operation with your surgeon and caregivers. You should call the office at the above phone number and make an appointment for approximately two weeks after the date of your surgery or on the date instructed by your surgeon outlined in the "After Visit Summary".  RANGE OF MOTION AND STRENGTHENING EXERCISES  These exercises are designed to help you keep full movement of your hip joint. Follow your caregiver's or physical therapist's instructions. Perform all exercises about fifteen times, three times per day or as directed. Exercise both hips, even if you have had only one joint replacement. These exercises can be done on a training (exercise) mat, on the floor, on a table or on a bed. Use whatever works the best and is most comfortable for you. Use music or television while you are exercising so that the exercises are a pleasant break in your day. This will make your life better with the exercises acting as a break in routine you can look forward to.  Lying on your back, slowly slide your foot toward your buttocks, raising your knee up off the floor. Then slowly slide your foot back down until your leg is  straight again.  Lying  on your back spread your legs as far apart as you can without causing discomfort.  Lying on your side, raise your upper leg and foot straight up from the floor as far as is comfortable. Slowly lower the leg and repeat.  Lying on your back, tighten up the muscle in the front of your thigh (quadriceps muscles). You can do this by keeping your leg straight and trying to raise your heel off the floor. This helps strengthen the largest muscle supporting your knee.  Lying on your back, tighten up the muscles of your buttocks both with the legs straight and with the knee bent at a comfortable angle while keeping your heel on the floor.   IF YOU ARE TRANSFERRED TO A SKILLED REHAB FACILITY If the patient is transferred to a skilled rehab facility following release from the hospital, a list of the current medications will be sent to the facility for the patient to continue.  When discharged from the skilled rehab facility, please have the facility set up the patient's [place] prior to being released. Also, the skilled facility will be responsible for providing the patient with their medications at time of release from the facility to include their pain medication, the muscle relaxants, and their blood thinner medication. If the patient is still at the rehab facility at time of the two week follow up appointment, the skilled rehab facility will also need to assist the patient in arranging follow up appointment in our office and any transportation needs.  MAKE SURE YOU:  Understand these instructions.  Get help right away if you are not doing well or get worse.    Pick up stool softner and laxative for home use following surgery while on pain medications. Do not submerge incision under water. Please use good hand washing techniques while changing dressing each day. May shower starting three days after surgery. Please use a clean towel to pat the incision dry  following showers. Continue to use ice for pain and swelling after surgery. Do not use any lotions or creams on the incision until instructed by your surgeon.   Do not sit on low chairs, stoools or toilet seats, as it may be difficult to get up from low surfaces   Complete by: As directed    Driving restrictions   Complete by: As directed    No driving for two weeks   TED hose   Complete by: As directed    Use stockings (TED hose) for three weeks on both leg(s).  You may remove them at night for sleeping.   Weight bearing as tolerated   Complete by: As directed      Allergies as of 10/19/2018      Reactions   Estrogens Swelling   edema   Codeine Swelling, Rash   Penicillins Other (See Comments)   Break out in welts. Did it involve swelling of the face/tongue/throat, SOB, or low BP? No Did it involve sudden or severe rash/hives, skin peeling, or any reaction on the inside of your mouth or nose? No Did you need to seek medical attention at a hospital or doctor's office? No When did it last happen? Childhood allergy If all above answers are "NO", may proceed with cephalosporin use.   Diphenhydramine Hcl Other (See Comments)   hallucinations       Medication List    TAKE these medications   acetaminophen 500 MG tablet Commonly known as: TYLENOL Take 1,000 mg by mouth every 6 (  six) hours as needed for moderate pain or headache.   ALPRAZolam 1 MG tablet Commonly known as: XANAX Take 1 tablet (1 mg total) by mouth 3 (three) times daily as needed for anxiety. What changed: when to take this   citalopram 20 MG tablet Commonly known as: CELEXA Take 20 mg by mouth daily.   diltiazem 240 MG 24 hr capsule Commonly known as: CARDIZEM CD Take 1 capsule (240 mg total) by mouth daily.   enoxaparin 40 MG/0.4ML injection Commonly known as: LOVENOX Inject 0.4 mLs (40 mg total) into the skin daily for 4 days.   HYDROmorphone 2 MG tablet Commonly known as: DILAUDID Take 1-2  tablets (2-4 mg total) by mouth every 6 (six) hours as needed for moderate pain.   methocarbamol 500 MG tablet Commonly known as: ROBAXIN Take 1 tablet (500 mg total) by mouth every 6 (six) hours as needed for muscle spasms.   warfarin 5 MG tablet Commonly known as: COUMADIN Take as directed. If you are unsure how to take this medication, talk to your nurse or doctor. Original instructions: Take 1/2 tablet daily except 1 tablet on Sundays, Tuesdays and Thursdays What changed:   how much to take  how to take this  when to take this  additional instructions            Discharge Care Instructions  (From admission, onward)         Start     Ordered   10/19/18 0000  Weight bearing as tolerated     09 /22/20 1041   10/19/18 0000  Change dressing    Comments: You may change your dressing on Wednesday, then change the dressing daily with sterile 4 x 4 inch gauze dressing and paper tape.   10/19/18 1041         Follow-up Information    Gaynelle Arabian, MD. Schedule an appointment as soon as possible for a visit on 11/02/2018.   Specialty: Orthopedic Surgery Contact information: [address] [place] [address] [phone]           Signed: Griffith Citron, PA-C Orthopedic Surgery 10/20/2018, 7:20 AM

## 2018-10-20 NOTE — Anesthesia Postprocedure Evaluation (Signed)
Anesthesia Post Note  Patient: Marissa Turner  Procedure(s) Performed: TOTAL HIP ARTHROPLASTY ANTERIOR APPROACH (Left Hip)     Patient location during evaluation: PACU Anesthesia Type: General Level of consciousness: awake and alert Pain management: pain level controlled Vital Signs Assessment: post-procedure vital signs reviewed and stable Respiratory status: spontaneous breathing, nonlabored ventilation, respiratory function stable and patient connected to nasal cannula oxygen Cardiovascular status: blood pressure returned to baseline and stable Postop Assessment: no apparent nausea or vomiting Anesthetic complications: no    Last Vitals:  Vitals:   10/19/18 0953 10/19/18 1344  BP: 96/61 101/63  Pulse:  83  Resp:  16  Temp:  36.9 C  SpO2:  90%    Last Pain:  Vitals:   10/19/18 1344  TempSrc: Oral  PainSc:                  [place] S

## 2018-10-21 ENCOUNTER — Encounter (HOSPITAL_COMMUNITY): Payer: Self-pay | Admitting: Orthopedic Surgery

## 2018-10-21 LAB — SURGICAL PATHOLOGY

## 2018-10-22 ENCOUNTER — Encounter (HOSPITAL_COMMUNITY): Payer: Medicare Other

## 2018-10-26 ENCOUNTER — Other Ambulatory Visit: Payer: Self-pay

## 2018-10-26 ENCOUNTER — Ambulatory Visit (INDEPENDENT_AMBULATORY_CARE_PROVIDER_SITE_OTHER): Payer: Medicare Other | Admitting: Pharmacist

## 2018-10-26 DIAGNOSIS — Z5181 Encounter for therapeutic drug level monitoring: Secondary | ICD-10-CM | POA: Diagnosis not present

## 2018-10-26 DIAGNOSIS — I482 Chronic atrial fibrillation, unspecified: Secondary | ICD-10-CM | POA: Diagnosis not present

## 2018-10-26 LAB — POCT INR: INR: 1.5 — AB (ref 2.0–3.0)

## 2018-10-26 NOTE — Patient Instructions (Signed)
Description   Take 1.5 tablets today and 1 tablet tomorrow, then continue taking 1 tablet daily except 1/2 tablet on Mondays, Wednesdays and Fridays. Recheck in 1 week

## 2018-11-02 ENCOUNTER — Ambulatory Visit (INDEPENDENT_AMBULATORY_CARE_PROVIDER_SITE_OTHER): Payer: Medicare Other | Admitting: *Deleted

## 2018-11-02 DIAGNOSIS — Z5181 Encounter for therapeutic drug level monitoring: Secondary | ICD-10-CM

## 2018-11-02 DIAGNOSIS — I482 Chronic atrial fibrillation, unspecified: Secondary | ICD-10-CM

## 2018-11-02 LAB — POCT INR: INR: 2.3 (ref 2.0–3.0)

## 2018-11-02 NOTE — Patient Instructions (Signed)
Continue taking 1 tablet daily except 1/2 tablet on Mondays, Wednesdays and Fridays. Recheck in 3 weeks

## 2018-11-04 DIAGNOSIS — Z96642 Presence of left artificial hip joint: Secondary | ICD-10-CM | POA: Diagnosis not present

## 2018-11-04 DIAGNOSIS — G2581 Restless legs syndrome: Secondary | ICD-10-CM | POA: Diagnosis not present

## 2018-11-04 DIAGNOSIS — I482 Chronic atrial fibrillation, unspecified: Secondary | ICD-10-CM | POA: Diagnosis not present

## 2018-11-04 DIAGNOSIS — F17211 Nicotine dependence, cigarettes, in remission: Secondary | ICD-10-CM | POA: Diagnosis not present

## 2018-11-12 DIAGNOSIS — Z96642 Presence of left artificial hip joint: Secondary | ICD-10-CM | POA: Diagnosis not present

## 2018-11-12 DIAGNOSIS — I482 Chronic atrial fibrillation, unspecified: Secondary | ICD-10-CM | POA: Diagnosis not present

## 2018-11-12 DIAGNOSIS — G2581 Restless legs syndrome: Secondary | ICD-10-CM | POA: Diagnosis not present

## 2018-11-12 DIAGNOSIS — F17211 Nicotine dependence, cigarettes, in remission: Secondary | ICD-10-CM | POA: Diagnosis not present

## 2018-11-16 NOTE — Progress Notes (Signed)
Called pt to discuss changing from warfarin to [place] due to better efficacy and safety data, as well as less frequent monitoring, especially given COVID-19 pandemic. Pt prefers to continue on warfarin at this time since it is working well for her.

## 2018-11-23 ENCOUNTER — Ambulatory Visit (INDEPENDENT_AMBULATORY_CARE_PROVIDER_SITE_OTHER): Payer: Medicare Other | Admitting: *Deleted

## 2018-11-23 ENCOUNTER — Other Ambulatory Visit: Payer: Self-pay

## 2018-11-23 DIAGNOSIS — Z96642 Presence of left artificial hip joint: Secondary | ICD-10-CM | POA: Diagnosis not present

## 2018-11-23 DIAGNOSIS — Z5181 Encounter for therapeutic drug level monitoring: Secondary | ICD-10-CM

## 2018-11-23 DIAGNOSIS — I482 Chronic atrial fibrillation, unspecified: Secondary | ICD-10-CM

## 2018-11-23 LAB — POCT INR: INR: 1.7 — AB (ref 2.0–3.0)

## 2018-11-23 NOTE — Patient Instructions (Signed)
Take 1 1/2 tablets tonight, 1 tablet tomorrow night then resume 1 tablet daily except 1/2 tablet on Mondays, Wednesdays and Fridays. Recheck in 3 weeks

## 2018-11-29 ENCOUNTER — Telehealth: Payer: Self-pay | Admitting: *Deleted

## 2018-11-29 NOTE — Telephone Encounter (Signed)
Patient called in regards to her coumdin. States that she is confused about how to take the medication.

## 2018-11-29 NOTE — Telephone Encounter (Signed)
Warfarin instructions verified with patient and she verbalized understanding.  INR follow up on 12/15/18.

## 2018-12-02 DIAGNOSIS — G2581 Restless legs syndrome: Secondary | ICD-10-CM | POA: Diagnosis not present

## 2018-12-02 DIAGNOSIS — I482 Chronic atrial fibrillation, unspecified: Secondary | ICD-10-CM | POA: Diagnosis not present

## 2018-12-02 DIAGNOSIS — Z96642 Presence of left artificial hip joint: Secondary | ICD-10-CM | POA: Diagnosis not present

## 2018-12-02 DIAGNOSIS — M25541 Pain in joints of right hand: Secondary | ICD-10-CM | POA: Diagnosis not present

## 2018-12-15 ENCOUNTER — Other Ambulatory Visit: Payer: Self-pay

## 2018-12-15 ENCOUNTER — Ambulatory Visit (INDEPENDENT_AMBULATORY_CARE_PROVIDER_SITE_OTHER): Payer: Medicare Other | Admitting: *Deleted

## 2018-12-15 DIAGNOSIS — I482 Chronic atrial fibrillation, unspecified: Secondary | ICD-10-CM

## 2018-12-15 DIAGNOSIS — Z5181 Encounter for therapeutic drug level monitoring: Secondary | ICD-10-CM

## 2018-12-15 LAB — POCT INR: INR: 2.2 (ref 2.0–3.0)

## 2018-12-15 NOTE — Patient Instructions (Signed)
Continue warfarin 1 tablet daily except 1/2 tablet on Mondays, Wednesdays and Fridays Recheck in 4 weeks 

## 2018-12-16 DIAGNOSIS — Z23 Encounter for immunization: Secondary | ICD-10-CM | POA: Diagnosis not present

## 2018-12-22 DIAGNOSIS — Z23 Encounter for immunization: Secondary | ICD-10-CM | POA: Diagnosis not present

## 2018-12-28 DIAGNOSIS — M1711 Unilateral primary osteoarthritis, right knee: Secondary | ICD-10-CM | POA: Diagnosis not present

## 2018-12-28 DIAGNOSIS — M25561 Pain in right knee: Secondary | ICD-10-CM | POA: Diagnosis not present

## 2018-12-28 DIAGNOSIS — Z96642 Presence of left artificial hip joint: Secondary | ICD-10-CM | POA: Diagnosis not present

## 2019-01-12 ENCOUNTER — Ambulatory Visit (INDEPENDENT_AMBULATORY_CARE_PROVIDER_SITE_OTHER): Payer: Medicare Other | Admitting: *Deleted

## 2019-01-12 ENCOUNTER — Other Ambulatory Visit: Payer: Self-pay

## 2019-01-12 ENCOUNTER — Other Ambulatory Visit: Payer: Self-pay | Admitting: *Deleted

## 2019-01-12 DIAGNOSIS — I482 Chronic atrial fibrillation, unspecified: Secondary | ICD-10-CM

## 2019-01-12 DIAGNOSIS — Z5181 Encounter for therapeutic drug level monitoring: Secondary | ICD-10-CM | POA: Diagnosis not present

## 2019-01-12 LAB — POCT INR: INR: 2.1 (ref 2.0–3.0)

## 2019-01-12 NOTE — Patient Instructions (Signed)
Continue warfarin 1 tablet daily except 1/2 tablet on Mondays, Wednesdays and Fridays Recheck in 4 weeks 

## 2019-02-09 ENCOUNTER — Ambulatory Visit (INDEPENDENT_AMBULATORY_CARE_PROVIDER_SITE_OTHER): Payer: Medicare Other | Admitting: *Deleted

## 2019-02-09 ENCOUNTER — Other Ambulatory Visit: Payer: Self-pay

## 2019-02-09 DIAGNOSIS — I482 Chronic atrial fibrillation, unspecified: Secondary | ICD-10-CM | POA: Diagnosis not present

## 2019-02-09 DIAGNOSIS — Z5181 Encounter for therapeutic drug level monitoring: Secondary | ICD-10-CM

## 2019-02-09 LAB — POCT INR: INR: 1.7 — AB (ref 2.0–3.0)

## 2019-02-09 NOTE — Patient Instructions (Signed)
Take warfarin 1 1/2 tablets tonight then resume 1 tablet daily except 1/2 tablet on Mondays, Wednesdays and Fridays. Recheck in 3 weeks

## 2019-02-16 DIAGNOSIS — Z20822 Contact with and (suspected) exposure to covid-19: Secondary | ICD-10-CM | POA: Diagnosis not present

## 2019-02-16 DIAGNOSIS — J069 Acute upper respiratory infection, unspecified: Secondary | ICD-10-CM | POA: Diagnosis not present

## 2019-02-16 DIAGNOSIS — R05 Cough: Secondary | ICD-10-CM | POA: Diagnosis not present

## 2019-02-21 DIAGNOSIS — U071 COVID-19: Secondary | ICD-10-CM | POA: Diagnosis not present

## 2019-02-22 DIAGNOSIS — D509 Iron deficiency anemia, unspecified: Secondary | ICD-10-CM | POA: Diagnosis not present

## 2019-02-22 DIAGNOSIS — F17218 Nicotine dependence, cigarettes, with other nicotine-induced disorders: Secondary | ICD-10-CM | POA: Diagnosis not present

## 2019-02-22 DIAGNOSIS — Z7901 Long term (current) use of anticoagulants: Secondary | ICD-10-CM | POA: Diagnosis not present

## 2019-02-22 DIAGNOSIS — I10 Essential (primary) hypertension: Secondary | ICD-10-CM | POA: Diagnosis not present

## 2019-02-22 DIAGNOSIS — M1612 Unilateral primary osteoarthritis, left hip: Secondary | ICD-10-CM | POA: Diagnosis not present

## 2019-02-22 DIAGNOSIS — E782 Mixed hyperlipidemia: Secondary | ICD-10-CM | POA: Diagnosis not present

## 2019-02-22 DIAGNOSIS — I4891 Unspecified atrial fibrillation: Secondary | ICD-10-CM | POA: Diagnosis not present

## 2019-03-02 ENCOUNTER — Ambulatory Visit (INDEPENDENT_AMBULATORY_CARE_PROVIDER_SITE_OTHER): Payer: Medicare Other | Admitting: *Deleted

## 2019-03-02 ENCOUNTER — Other Ambulatory Visit: Payer: Self-pay

## 2019-03-02 DIAGNOSIS — I482 Chronic atrial fibrillation, unspecified: Secondary | ICD-10-CM

## 2019-03-02 DIAGNOSIS — Z5181 Encounter for therapeutic drug level monitoring: Secondary | ICD-10-CM

## 2019-03-02 LAB — POCT INR: INR: 5.6 — AB (ref 2.0–3.0)

## 2019-03-02 NOTE — Patient Instructions (Signed)
Hold warfarin x 3 days then resume 1 tablet daily except 1/2 tablet on Mondays, Wednesdays and Fridays. Recheck in 1 week Eat greens today

## 2019-03-10 ENCOUNTER — Other Ambulatory Visit: Payer: Self-pay

## 2019-03-10 ENCOUNTER — Ambulatory Visit (INDEPENDENT_AMBULATORY_CARE_PROVIDER_SITE_OTHER): Payer: Medicare Other | Admitting: *Deleted

## 2019-03-10 DIAGNOSIS — I482 Chronic atrial fibrillation, unspecified: Secondary | ICD-10-CM

## 2019-03-10 DIAGNOSIS — Z5181 Encounter for therapeutic drug level monitoring: Secondary | ICD-10-CM

## 2019-03-10 LAB — POCT INR: INR: 1.7 — AB (ref 2.0–3.0)

## 2019-03-10 NOTE — Patient Instructions (Signed)
Take warfarin 1 1/2 tablets tonight then resume 1 tablet daily except 1/2 tablet on Mondays, Wednesdays and Fridays. Recheck in 3 weeks Keep greens consistent

## 2019-03-25 DIAGNOSIS — H52223 Regular astigmatism, bilateral: Secondary | ICD-10-CM | POA: Diagnosis not present

## 2019-03-25 DIAGNOSIS — H5203 Hypermetropia, bilateral: Secondary | ICD-10-CM | POA: Diagnosis not present

## 2019-03-25 DIAGNOSIS — H524 Presbyopia: Secondary | ICD-10-CM | POA: Diagnosis not present

## 2019-03-25 DIAGNOSIS — H2513 Age-related nuclear cataract, bilateral: Secondary | ICD-10-CM | POA: Diagnosis not present

## 2019-03-29 DIAGNOSIS — Z23 Encounter for immunization: Secondary | ICD-10-CM | POA: Diagnosis not present

## 2019-03-30 ENCOUNTER — Ambulatory Visit (INDEPENDENT_AMBULATORY_CARE_PROVIDER_SITE_OTHER): Payer: Medicare Other | Admitting: *Deleted

## 2019-03-30 ENCOUNTER — Other Ambulatory Visit: Payer: Self-pay

## 2019-03-30 DIAGNOSIS — Z5181 Encounter for therapeutic drug level monitoring: Secondary | ICD-10-CM | POA: Diagnosis not present

## 2019-03-30 DIAGNOSIS — I482 Chronic atrial fibrillation, unspecified: Secondary | ICD-10-CM | POA: Diagnosis not present

## 2019-03-30 LAB — POCT INR: INR: 1.5 — AB (ref 2.0–3.0)

## 2019-03-30 NOTE — Patient Instructions (Signed)
Take warfarin 1 1/2 tablets tonight then increase dose to 1 tablet daily except 1/2 tablet on Mondays and Fridays. Recheck in 3 weeks Keep greens consistent

## 2019-04-01 ENCOUNTER — Other Ambulatory Visit: Payer: Self-pay | Admitting: Thoracic Surgery (Cardiothoracic Vascular Surgery)

## 2019-04-01 DIAGNOSIS — C3492 Malignant neoplasm of unspecified part of left bronchus or lung: Secondary | ICD-10-CM

## 2019-04-05 ENCOUNTER — Ambulatory Visit: Payer: Medicare Other | Admitting: Thoracic Surgery (Cardiothoracic Vascular Surgery)

## 2019-04-05 NOTE — Progress Notes (Unsigned)
This encounter was created in error - please disregard.

## 2019-04-19 DIAGNOSIS — M1612 Unilateral primary osteoarthritis, left hip: Secondary | ICD-10-CM | POA: Diagnosis not present

## 2019-04-19 DIAGNOSIS — Z7901 Long term (current) use of anticoagulants: Secondary | ICD-10-CM | POA: Diagnosis not present

## 2019-04-19 DIAGNOSIS — I10 Essential (primary) hypertension: Secondary | ICD-10-CM | POA: Diagnosis not present

## 2019-04-19 DIAGNOSIS — I4891 Unspecified atrial fibrillation: Secondary | ICD-10-CM | POA: Diagnosis not present

## 2019-04-19 DIAGNOSIS — F17218 Nicotine dependence, cigarettes, with other nicotine-induced disorders: Secondary | ICD-10-CM | POA: Diagnosis not present

## 2019-04-19 DIAGNOSIS — E782 Mixed hyperlipidemia: Secondary | ICD-10-CM | POA: Diagnosis not present

## 2019-04-19 DIAGNOSIS — D509 Iron deficiency anemia, unspecified: Secondary | ICD-10-CM | POA: Diagnosis not present

## 2019-04-20 ENCOUNTER — Ambulatory Visit (INDEPENDENT_AMBULATORY_CARE_PROVIDER_SITE_OTHER): Payer: Medicare Other | Admitting: *Deleted

## 2019-04-20 ENCOUNTER — Other Ambulatory Visit: Payer: Self-pay

## 2019-04-20 DIAGNOSIS — I482 Chronic atrial fibrillation, unspecified: Secondary | ICD-10-CM | POA: Diagnosis not present

## 2019-04-20 DIAGNOSIS — Z5181 Encounter for therapeutic drug level monitoring: Secondary | ICD-10-CM

## 2019-04-20 LAB — POCT INR: INR: 2.7 (ref 2.0–3.0)

## 2019-04-20 NOTE — Patient Instructions (Signed)
Continue warfarin 1 tablet daily except 1/2 tablet on Mondays and Fridays. Recheck in 4 weeks Keep greens consistent

## 2019-04-21 DIAGNOSIS — C4441 Basal cell carcinoma of skin of scalp and neck: Secondary | ICD-10-CM | POA: Diagnosis not present

## 2019-04-21 DIAGNOSIS — D235 Other benign neoplasm of skin of trunk: Secondary | ICD-10-CM | POA: Diagnosis not present

## 2019-04-26 ENCOUNTER — Ambulatory Visit: Payer: Medicare Other | Admitting: Thoracic Surgery (Cardiothoracic Vascular Surgery)

## 2019-04-27 DIAGNOSIS — Z23 Encounter for immunization: Secondary | ICD-10-CM | POA: Diagnosis not present

## 2019-05-03 ENCOUNTER — Other Ambulatory Visit: Payer: Self-pay

## 2019-05-03 ENCOUNTER — Ambulatory Visit
Admission: RE | Admit: 2019-05-03 | Discharge: 2019-05-03 | Disposition: A | Discharge status: Home / Self Care | Payer: Medicare Other | Source: Ambulatory Visit | Attending: Thoracic Surgery (Cardiothoracic Vascular Surgery) | Admitting: Thoracic Surgery (Cardiothoracic Vascular Surgery)

## 2019-05-03 ENCOUNTER — Ambulatory Visit (INDEPENDENT_AMBULATORY_CARE_PROVIDER_SITE_OTHER): Payer: Medicare Other | Admitting: Thoracic Surgery (Cardiothoracic Vascular Surgery)

## 2019-05-03 VITALS — BP 120/73 | HR 54 | Temp 97.6°F | Resp 20 | Ht 60.0 in | Wt 132.0 lb

## 2019-05-03 DIAGNOSIS — R0602 Shortness of breath: Secondary | ICD-10-CM | POA: Diagnosis not present

## 2019-05-03 DIAGNOSIS — C3492 Malignant neoplasm of unspecified part of left bronchus or lung: Secondary | ICD-10-CM

## 2019-05-03 DIAGNOSIS — Z902 Acquired absence of lung [part of]: Secondary | ICD-10-CM

## 2019-05-03 NOTE — Progress Notes (Signed)
[address]       [place] [address]             [phone]      Marissa Turner returns for scheduled follow-up visit  Marissa Turner is a 79 year old woman with a past history of tobacco abuse, COPD, large cell lymphoma, hypertension, chronic atrial fibrillation, degenerative joint disease, and a stage IIa squamous cell carcinoma.  She had a left lower lobectomy on 04/15/2018.  I last saw her in June 2020.  She was having some numbness in her right hand.  She says it has been the thumb, index and middle fingers all along.  (My previous note had indicated fourth and fifth fingers).  She has gained a significant amount of weight, but has not smoked since surgery.  She saw Dr. Delton Coombes at [facility] postoperatively.  She was scheduled to start chemotherapy but then she opted out and decided not to do it.  She has not followed up with him since then.  She did have her hip replaced by Dr. Maureen Ralphs.  Past Medical History:  Diagnosis Date  . Atrial fibrillation, chronic 2004   left atrial thrombus in 2004; near syncope in 2008; echocardiogram in 2008-normal EF, mild left atrial enlargement, no valvular abnormalities, lipomatous hypertrophy; no aortic atheroma on TEE in 2004  . Cancer (HCC)    LYMPH NODE    and lung cancer  . Chronic anticoagulation 2004   warfarin  . COPD (chronic obstructive pulmonary disease) ([place])    denies  . Depression   . DJD (degenerative joint disease), lumbosacral    Also hip  . Dysrhythmia    chronic AFib  . Family history of adverse reaction to anesthesia    sister PONV  . Fracture of wrist    Left  . History of kidney stones    once  . Hypotension   . Panic attacks   . Peptic ulcer disease    s/p Billroth II  . PONV (postoperative nausea and vomiting)   . Tobacco abuse    40 pack years; quit attempt in 2012     Current Outpatient Medications  Medication Sig Dispense Refill  . acetaminophen (TYLENOL) 500 MG tablet Take  1,000 mg by mouth every 6 (six) hours as needed for moderate pain or headache.     . ALPRAZolam (XANAX) 1 MG tablet Take 1 tablet (1 mg total) by mouth 3 (three) times daily as needed for anxiety. (Patient taking differently: Take 1 mg by mouth at bedtime. ) 30 tablet 0  . citalopram (CELEXA) 20 MG tablet Take 20 mg by mouth daily.    [name] diltiazem (CARDIZEM CD) 240 MG 24 hr capsule Take 1 capsule (240 mg total) by mouth daily. 90 capsule 3  . warfarin (COUMADIN) 5 MG tablet Take 1/2 tablet daily except 1 tablet on Sundays, Tuesdays and Thursdays (Patient taking differently: Take 2.5-5 mg by mouth See admin instructions. Take 2.5mg  on Mondays, Wednesdays, and Fridays. Take 5mg  on all other days of the week.) 90 tablet 3   No current facility-administered medications for this visit.    Physical Exam BP 120/73   Pulse (!) 54   Temp 97.6 F (36.4 C) (Skin)   Resp 20   Ht 5' (1.524 m)   Wt 132 lb (59.9 kg)   SpO2 91% Comment: RA  BMI 25.85 kg/m  79 year old woman in no acute distress Alert and oriented x3 with no focal deficits Lungs  diminished at left base but otherwise clear No cervical or supraclavicular adenopathy Cardiac irregularly irregular  Diagnostic Tests: CHEST - 2 VIEW  COMPARISON:  Chest radiograph dated 07/06/2018  FINDINGS: There is background of emphysema. Postsurgical changes of the left upper lobe with an area of subpleural scarring in the region of the lingula. Small left pleural effusion with associated left lung base atelectasis similar to prior radiograph. No new consolidation. No pneumothorax. Stable cardiomediastinal silhouette. Atherosclerotic calcification of the aorta. Multiple surgical clips in the upper abdomen as well as surgical clips in the left neck. No acute osseous pathology.  IMPRESSION: 1. No new consolidation.  No interval change. 2. Small left pleural effusion and left lung base atelectasis versus infiltrate similar to prior  radiograph.   Electronically Signed   By: Marissa Turner M.D.   On: 05/03/2019 15:42 I personally reviewed the chest x-ray images and concur with the findings noted above  Impression: Marissa Turner is a 79 year old woman with a history of tobacco abuse, COPD, large cell lymphoma, hypertension, chronic atrial fibrillation, degenerative joint disease, and a stage IIa squamous cell carcinoma.  She is now a little over a year out from a thoracoscopic left lower lobectomy.  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  She never followed back up with Dr. Delton Coombes so she did not have her CT scan done in 6 months.  She is due for a 1 year follow-up scan so I will go ahead and order that and arrange for her to see Dr. Delton Coombes in follow-up.  Tobacco abuse-she quit smoking after her surgery.  I am very pleased with that.  She knows the importance of continued abstinence.  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  Will order physical therapy although she may always have some residual deficit there.  Plan: CT chest Follow-up with Dr. Delton Coombes Physical therapy Return in 6 months with PA lateral chest x-ray  Marissa Nakayama, MD [facility] [phone]

## 2019-05-04 ENCOUNTER — Other Ambulatory Visit: Payer: Self-pay | Admitting: *Deleted

## 2019-05-04 DIAGNOSIS — G540 Brachial plexus disorders: Secondary | ICD-10-CM

## 2019-05-05 ENCOUNTER — Other Ambulatory Visit: Payer: Self-pay

## 2019-05-05 ENCOUNTER — Telehealth: Payer: Self-pay

## 2019-05-05 DIAGNOSIS — G540 Brachial plexus disorders: Secondary | ICD-10-CM

## 2019-05-05 NOTE — Telephone Encounter (Signed)
Received phone call from [facility] @ [facility], who states pt would benefit more from OT than PT for the problems she is having w/ her R hand. Order placed for OT.

## 2019-05-09 ENCOUNTER — Other Ambulatory Visit: Payer: Self-pay

## 2019-05-09 ENCOUNTER — Encounter (HOSPITAL_COMMUNITY): Payer: Self-pay | Admitting: Hematology

## 2019-05-09 ENCOUNTER — Inpatient Hospital Stay (HOSPITAL_COMMUNITY): Payer: Medicare Other

## 2019-05-09 ENCOUNTER — Inpatient Hospital Stay (HOSPITAL_COMMUNITY): Payer: Medicare Other | Attending: Hematology | Admitting: Hematology

## 2019-05-09 VITALS — BP 131/61 | HR 76 | Temp 96.9°F | Resp 18 | Wt 144.0 lb

## 2019-05-09 DIAGNOSIS — Z923 Personal history of irradiation: Secondary | ICD-10-CM | POA: Diagnosis not present

## 2019-05-09 DIAGNOSIS — Z9221 Personal history of antineoplastic chemotherapy: Secondary | ICD-10-CM | POA: Diagnosis not present

## 2019-05-09 DIAGNOSIS — Z902 Acquired absence of lung [part of]: Secondary | ICD-10-CM | POA: Insufficient documentation

## 2019-05-09 DIAGNOSIS — C3432 Malignant neoplasm of lower lobe, left bronchus or lung: Secondary | ICD-10-CM | POA: Insufficient documentation

## 2019-05-09 DIAGNOSIS — I4891 Unspecified atrial fibrillation: Secondary | ICD-10-CM | POA: Diagnosis not present

## 2019-05-09 DIAGNOSIS — Z87891 Personal history of nicotine dependence: Secondary | ICD-10-CM | POA: Diagnosis not present

## 2019-05-09 DIAGNOSIS — C3492 Malignant neoplasm of unspecified part of left bronchus or lung: Secondary | ICD-10-CM | POA: Diagnosis not present

## 2019-05-09 DIAGNOSIS — Z7901 Long term (current) use of anticoagulants: Secondary | ICD-10-CM | POA: Diagnosis not present

## 2019-05-09 DIAGNOSIS — J90 Pleural effusion, not elsewhere classified: Secondary | ICD-10-CM | POA: Insufficient documentation

## 2019-05-09 DIAGNOSIS — Z8572 Personal history of non-Hodgkin lymphomas: Secondary | ICD-10-CM | POA: Insufficient documentation

## 2019-05-09 DIAGNOSIS — D509 Iron deficiency anemia, unspecified: Secondary | ICD-10-CM | POA: Diagnosis not present

## 2019-05-09 LAB — CBC WITH DIFFERENTIAL/PLATELET
Abs Immature Granulocytes: 0.04 10*3/uL (ref 0.00–0.07)
Basophils Absolute: 0.1 10*3/uL (ref 0.0–0.1)
Basophils Relative: 1 %
Eosinophils Absolute: 0.6 10*3/uL — ABNORMAL HIGH (ref 0.0–0.5)
Eosinophils Relative: 5 %
HCT: 32.6 % — ABNORMAL LOW (ref 36.0–46.0)
Hemoglobin: 9.4 g/dL — ABNORMAL LOW (ref 12.0–15.0)
Immature Granulocytes: 0 %
Lymphocytes Relative: 37 %
Lymphs Abs: 4.7 10*3/uL — ABNORMAL HIGH (ref 0.7–4.0)
MCH: 22.4 pg — ABNORMAL LOW (ref 26.0–34.0)
MCHC: 28.8 g/dL — ABNORMAL LOW (ref 30.0–36.0)
MCV: 77.6 fL — ABNORMAL LOW (ref 80.0–100.0)
Monocytes Absolute: 1.2 10*3/uL — ABNORMAL HIGH (ref 0.1–1.0)
Monocytes Relative: 9 %
Neutro Abs: 6.1 10*3/uL (ref 1.7–7.7)
Neutrophils Relative %: 48 %
Platelets: 434 10*3/uL — ABNORMAL HIGH (ref 150–400)
RBC: 4.2 MIL/uL (ref 3.87–5.11)
RDW: 20.3 % — ABNORMAL HIGH (ref 11.5–15.5)
WBC: 12.6 10*3/uL — ABNORMAL HIGH (ref 4.0–10.5)
nRBC: 0 % (ref 0.0–0.2)

## 2019-05-09 LAB — COMPREHENSIVE METABOLIC PANEL
ALT: 17 U/L (ref 0–44)
AST: 19 U/L (ref 15–41)
Albumin: 3.7 g/dL (ref 3.5–5.0)
Alkaline Phosphatase: 77 U/L (ref 38–126)
Anion gap: 9 (ref 5–15)
BUN: 26 mg/dL — ABNORMAL HIGH (ref 8–23)
CO2: 26 mmol/L (ref 22–32)
Calcium: 8.2 mg/dL — ABNORMAL LOW (ref 8.9–10.3)
Chloride: 102 mmol/L (ref 98–111)
Creatinine, Ser: 1.13 mg/dL — ABNORMAL HIGH (ref 0.44–1.00)
GFR calc Af Amer: 54 mL/min — ABNORMAL LOW (ref >60)
GFR calc non Af Amer: 47 mL/min — ABNORMAL LOW (ref >60)
Glucose, Bld: 98 mg/dL (ref 70–99)
Potassium: 3.7 mmol/L (ref 3.5–5.1)
Sodium: 137 mmol/L (ref 135–145)
Total Bilirubin: 0.4 mg/dL (ref 0.3–1.2)
Total Protein: 7.1 g/dL (ref 6.5–8.1)

## 2019-05-09 LAB — LACTATE DEHYDROGENASE: LDH: 188 U/L (ref 98–192)

## 2019-05-09 NOTE — Progress Notes (Signed)
Marissa Turner, [place] [address]   CLINIC:  Medical Oncology/Hematology  PCP:  Celene Squibb, MD [place] [address] [phone]   REASON FOR VISIT:  Squamous cell left lung cancer.   BRIEF ONCOLOGIC HISTORY:  Oncology History  Lymphoma malignant, large cell ([place])  09/15/2013 Initial Diagnosis   Diagnosis Lymph node, biopsy, cervical left neck - DIFFUSE LARGE B CELL LYMPHOMA.   10/06/2013 PET scan   Hypermetabolic thickening within the right vallecular consists with primary head and neck cancer.  Single enlarged hypermetabolic right level IIa lymph node consistent local nodal metastasis.   10/31/2013 - 11/29/2013 Chemotherapy   Bendamustine/Rituxan x 2 cycles   12/19/2013 Progression   PET- Progression of presumed vallecular lymphoma as evidenced by increased hypermetabolism.   12/20/2013 Treatment Plan Change   Per Dr. Gari Crown, patient has a CD10 negative, non-germinal center non-Hodgkin's lymphoma.  Patient may be a candidate for additional of Revlimid to treatment plan.   12/28/2013 Echocardiogram   MUGA- Normal left ventricular wall motion with calculated ejection fraction of 60%.   01/02/2014 - 02/13/2014 Chemotherapy   R-CHOP with Neulasta support.   02/06/2014 Remission   PET- Resolution of the previously enlarged right-sided station 2 lymph  node in the neck with resolution of the abnormal right vallecular  activity in the neck. Currently no specific in findings of residual  lymphoma.    - 04/12/2014 Radiation Therapy   Dr. Lisbeth Renshaw   07/12/2014 Procedure   Port removed- Dr. Arnoldo Morale   Squamous cell lung cancer, left ([place])  04/20/2018 Initial Diagnosis   Squamous cell lung cancer, left ([place])   05/24/2018 -  Chemotherapy   The patient had palonosetron (ALOXI) injection 0.25 mg, 0.25 mg, Intravenous,  Once, 0 of 4 cycles pegfilgrastim-cbqv (UDENYCA) injection 6 mg, 6 mg, Subcutaneous, Once, 0 of 4 cycles CARBOplatin  (PARAPLATIN) in sodium chloride 0.9 % 100 mL chemo infusion, , Intravenous,  Once, 0 of 4 cycles Dose modification:   (Cycle 4) PACLitaxel (TAXOL) 252 mg in sodium chloride 0.9 % 250 mL chemo infusion (> 80mg /m2), 175 mg/m2 = 252 mg (original dose ), Intravenous,  Once, 0 of 4 cycles Dose modification: 175 mg/m2 (Cycle 1, Reason: Provider Judgment) fosaprepitant (EMEND) 150 mg, dexamethasone (DECADRON) 20 mg in sodium chloride 0.9 % 145 mL IVPB, , Intravenous,  Once, 0 of 4 cycles  for chemotherapy treatment.       CANCER STAGING: Cancer Staging Lymphoma malignant, large cell [facility]) Staging form: Lymphoid Neoplasms, AJCC 6th Edition - Clinical stage from 01/22/2014: Stage IE - Signed by Baird Cancer, PA-C on 04/07/2016    INTERVAL HISTORY:  Marissa Turner 79 y.o. female seen for follow-up of left lung cancer.  She was lost to follow-up as she did not want to have adjuvant chemotherapy.  She was recently evaluated by Dr. Roxan Hockey.  Reports appetite 100%.  Energy levels are 75%.  Reports weight gain.  Numbness in the right hand is stable.  Denies any chest pains or shortness of breath on exertion.  REVIEW OF SYSTEMS:  Review of Systems  Neurological: Positive for numbness.  All other systems reviewed and are negative.    PAST MEDICAL/SURGICAL HISTORY:  Past Medical History:  Diagnosis Date  . Atrial fibrillation, chronic ([place]) 2004   left atrial thrombus in 2004; near syncope in 2008; echocardiogram in 2008-normal EF, mild left atrial enlargement, no valvular abnormalities, lipomatous hypertrophy; no aortic atheroma on TEE in 2004  .  Cancer (HCC)    LYMPH NODE    and lung cancer  . Chronic anticoagulation 2004   warfarin  . COPD (chronic obstructive pulmonary disease) ([place])    denies  . Depression   . DJD (degenerative joint disease), lumbosacral    Also hip  . Dysrhythmia    chronic AFib  . Family history of adverse reaction to anesthesia    sister PONV  . Fracture of  wrist    Left  . History of kidney stones    once  . Hypotension   . Panic attacks   . Peptic ulcer disease    s/p Billroth II  . PONV (postoperative nausea and vomiting)   . Tobacco abuse    40 pack years; quit attempt in 2012   Past Surgical History:  Procedure Laterality Date  . ABDOMINAL HYSTERECTOMY     w/o oophorectomy  partial  . APPENDECTOMY    . CESAREAN SECTION    . CHOLECYSTECTOMY  2004   [place]   patient denies ever having undergone colonoscopy as of 2014  . GASTROJEJUNOSTOMY  1984   Billroth II; splenectomy; incidental appendectomy  . KNEE ARTHROSCOPY WITH MEDIAL MENISECTOMY Right 02/26/2015   Procedure: KNEE ARTHROSCOPY WITH MEDIAL MENISECTOMY;  Surgeon: Sanjuana Kava, MD;  Location: [facility];  Service: Orthopedics;  Laterality: Right;  . LYMPH NODE BIOPSY Left 09/15/2013   Procedure: LEFT NECK CERVICAL NODE BIOPSY;  Surgeon: Scherry Ran, MD;  Location: [facility];  Service: General;  Laterality: Left;  . PORT-A-CATH REMOVAL Right 07/12/2014   Procedure: REMOVAL PORT-A-CATH;  Surgeon: Aviva Signs Md, MD;  Location: [facility];  Service: General;  Laterality: Right;  . PORTACATH PLACEMENT Right 10/21/2013   Procedure: ATTEMPTED INSERTION PORT-A-CATH;  Surgeon: Scherry Ran, MD;  Location: [facility];  Service: General;  Laterality: Right;  . SPLENECTOMY, TOTAL    . THORACOSCOPY Left 04/15/2018   VIDEO ASSISTED THORACOSCOPY (VATS)/LEFT LOWER LOBECTOMY (Left)  . 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  [name] TOTAL HIP ARTHROPLASTY Left 10/18/2018   Procedure: TOTAL HIP ARTHROPLASTY ANTERIOR APPROACH;  Surgeon: Gaynelle Arabian, MD;  Location: [facility];  Service: Orthopedics;  Laterality: Left;  110min  . VIDEO ASSISTED THORACOSCOPY (VATS)/ LOBECTOMY Left 04/15/2018   Procedure: VIDEO ASSISTED THORACOSCOPY (VATS)/LEFT LOWER LOBECTOMY;  Surgeon: Melrose Nakayama, MD;  Location: [facility];  Service: Thoracic;  Laterality: Left;  . WEDGE  RESECTION Left 04/15/2018   Procedure: LEFT UPPER WEDGE RESECTION;  Surgeon: Melrose Nakayama, MD;  Location[facility];  Service: Thoracic;  Laterality: Left;     SOCIAL HISTORY:  Social History   Socioeconomic History  . Marital status: Married    Spouse name: Not on file  . Number of children: 2  . Years of education: Not on file  . Highest education level: Not on file  Occupational History  . Occupation: Housecleaning  Tobacco Use  . Smoking status: Former Smoker    Packs/day: 0.50    Years: 55.00    Pack years: 27.50    Types: Cigarettes  . Smokeless tobacco: Never Used  . Tobacco comment: quit quit in june  Substance and Sexual Activity  . Alcohol use: No  . Drug use: No  . Sexual activity: Yes    Birth control/protection: Surgical  Other Topics Concern  . Not on file  Social History Narrative   Married with one child and one adopted child, 1 stillbirth, and one  deceased child at age 67 due to illness   No regular exercise   Social Determinants of Health   Financial Resource Strain:   . Difficulty of Paying Living Expenses:   Food Insecurity:   . Worried About Charity fundraiser in the Last Year:   . Arboriculturist in the Last Year:   Transportation Needs:   . Film/video editor (Medical):   [name] Lack of Transportation (Non-Medical):   Physical Activity:   . Days of Exercise per Week:   . Minutes of Exercise per Session:   Stress:   . Feeling of Stress :   Social Connections:   . Frequency of Communication with Friends and Family:   . Frequency of Social Gatherings with Friends and Family:   . Attends Religious Services:   . Active Member of Clubs or Organizations:   . Attends Archivist Meetings:   [name] Marital Status:   Intimate Partner Violence:   . Fear of Current or Ex-Partner:   . Emotionally Abused:   [name] Physically Abused:   . Sexually Abused:     FAMILY HISTORY:  Family History  Problem Relation Age of Onset  . Heart attack  Mother        also brother  . Breast cancer Sister   . Heart attack Brother   . Breast cancer Sister        x2  . Cancer Brother        Gastric carcinoma; also father  . Cirrhosis Sister     CURRENT MEDICATIONS:  Outpatient Encounter Medications as of 05/09/2019  Medication Sig  . ALPRAZolam (XANAX) 1 MG tablet Take 1 tablet (1 mg total) by mouth 3 (three) times daily as needed for anxiety. (Patient taking differently: Take 1 mg by mouth at bedtime. )  . citalopram (CELEXA) 20 MG tablet Take 20 mg by mouth daily.  [name] diltiazem (CARDIZEM CD) 240 MG 24 hr capsule Take 1 capsule (240 mg total) by mouth daily.  [name] warfarin (COUMADIN) 5 MG tablet Take 1/2 tablet daily except 1 tablet on Sundays, Tuesdays and Thursdays (Patient taking differently: Take 2.5-5 mg by mouth See admin instructions. Take 2.5mg  on Mondays, Wednesdays, and Fridays. Take 5mg  on all other days of the week.)  . acetaminophen (TYLENOL) 500 MG tablet Take 1,000 mg by mouth every 6 (six) hours as needed for moderate pain or headache.    No facility-administered encounter medications on file as of 05/09/2019.    ALLERGIES:  Allergies  Allergen Reactions  . Estrogens Swelling    edema  . Codeine Swelling and Rash  . Penicillins Other (See Comments)    Break out in welts. Did it involve swelling of the face/tongue/throat, SOB, or low BP? No Did it involve sudden or severe rash/hives, skin peeling, or any reaction on the inside of your mouth or nose? No Did you need to seek medical attention at a hospital or doctor's office? No When did it last happen? Childhood allergy If all above answers are "NO", may proceed with cephalosporin use.   . Diphenhydramine Hcl Other (See Comments)    hallucinations      PHYSICAL EXAM:  ECOG Performance status: 1  Vitals:   05/09/19 1510  BP: 131/61  Pulse: 76  Resp: 18  Temp: (!) 96.9 F (36.1 C)  SpO2: 94%   Filed Weights   05/09/19 1510  Weight: 144 lb (65.3 kg)     Physical Exam Vitals reviewed.  Constitutional:  Appearance: Normal appearance.  Cardiovascular:     Rate and Rhythm: Normal rate and regular rhythm.     Heart sounds: Normal heart sounds.  Pulmonary:     Effort: Pulmonary effort is normal.     Breath sounds: Normal breath sounds.  Abdominal:     General: Bowel sounds are normal. There is no distension.     Palpations: Abdomen is soft. There is no mass.  Musculoskeletal:        General: No swelling.  Skin:    General: Skin is warm.  Neurological:     General: No focal deficit present.     Mental Status: She is alert and oriented to person, place, and time.  Psychiatric:        Mood and Affect: Mood normal.        Behavior: Behavior normal.      LABORATORY DATA:  I have reviewed the labs as listed.  CBC    Component Value Date/Time   WBC 12.6 (H) 05/09/2019 1559   RBC 4.20 05/09/2019 1559   HGB 9.4 (L) 05/09/2019 1559   HCT 32.6 (L) 05/09/2019 1559   PLT 434 (H) 05/09/2019 1559   MCV 77.6 (L) 05/09/2019 1559   MCH 22.4 (L) 05/09/2019 1559   MCHC 28.8 (L) 05/09/2019 1559   RDW 20.3 (H) 05/09/2019 1559   LYMPHSABS 4.7 (H) 05/09/2019 1559   MONOABS 1.2 (H) 05/09/2019 1559   EOSABS 0.6 (H) 05/09/2019 1559   BASOSABS 0.1 05/09/2019 1559   CMP Latest Ref Rng & Units 05/09/2019 10/19/2018 10/15/2018  Glucose 70 - 99 mg/dL 98 167(H) 100(H)  BUN 8 - 23 mg/dL 26(H) 15 19  Creatinine 0.44 - 1.00 mg/dL 1.13(H) 0.59 0.68  Sodium 135 - 145 mmol/L 137 135 141  Potassium 3.5 - 5.1 mmol/L 3.7 4.4 4.4  Chloride 98 - 111 mmol/L 102 100 103  CO2 22 - 32 mmol/L 26 25 30   Calcium 8.9 - 10.3 mg/dL 8.2(L) 8.2(L) 8.9  Total Protein 6.5 - 8.1 g/dL 7.1 - 7.6  Total Bilirubin 0.3 - 1.2 mg/dL 0.4 - 0.4  Alkaline Phos 38 - 126 U/L 77 - 81  AST 15 - 41 U/L 19 - 24  ALT 0 - 44 U/L 17 - 16       DIAGNOSTIC IMAGING:  I have reviewed scans.   I have reviewed Venita Lick LPN's note and agree with the documentation.  I personally  performed a face-to-face visit, made revisions and my assessment and plan is as follows.    ASSESSMENT & PLAN:   Squamous cell lung cancer, left (HCC) 1.  T2aN0 squamous cell carcinoma left lung: -VATS and left lower lobectomy on 04/15/2018 with pathology showing poorly differentiated squamous cell carcinoma, 4 cm, no visceral pleural invasion, lymph nodes negative. -She declined adjuvant chemotherapy with 4 cycles of carboplatin and paclitaxel. -She denies any new onset chest pains.  Denies any cough or hemoptysis. -Chest x-ray on 05/03/2019 showed no new consolidation.  Small left pleural effusion. -I have recommended surveillance CT scans once every 6 months.  We will set her up for a CT of the chest with contrast and labs. -We will see her back after scans and labs.  2.  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  She has been disease-free for more than 5 years.  Orders placed this encounter:  Orders Placed This Encounter  Procedures  . CT Chest W Contrast  . CBC  with Differential/Platelet  . Comprehensive metabolic panel  . Lactate dehydrogenase      Derek Jack, MD [place] [phone]

## 2019-05-09 NOTE — Patient Instructions (Signed)
[place] at [facility] Discharge Instructions  You were seen today by Dr. Delton Coombes. He went over your recent lab results. He will schedule you for repeat scans. He will see you back after scan for follow up.   Thank you for choosing [place] at [facility] to provide your oncology and hematology care.  To afford each patient quality time with our provider, please arrive at least 15 minutes before your scheduled appointment time.   If you have a lab appointment with the [facility] please come in thru the  Main Entrance and check in at the main information desk  You need to re-schedule your appointment should you arrive 10 or more minutes late.  We strive to give you quality time with our providers, and arriving late affects you and other patients whose appointments are after yours.  Also, if you no show three or more times for appointments you may be dismissed from the clinic at the providers discretion.     Again, thank you for choosing [facility].  Our hope is that these requests will decrease the amount of time that you wait before being seen by our physicians.       _____________________________________________________________  Should you have questions after your visit to [facility], please contact our office at [phone] between the hours of 8:00 a.m. and 4:30 p.m.  Voicemails left after 4:00 p.m. will not be returned until the following business day.  For prescription refill requests, have your pharmacy contact our office and allow 72 hours.    Cancer Center Support Programs:   > Cancer Support Group  2nd Tuesday of the month 1pm-2pm, Journey Room

## 2019-05-09 NOTE — Assessment & Plan Note (Signed)
1.  T2aN0 squamous cell carcinoma left lung: -VATS and left lower lobectomy on 04/15/2018 with pathology showing poorly differentiated squamous cell carcinoma, 4 cm, no visceral pleural invasion, lymph nodes negative. -She declined adjuvant chemotherapy with 4 cycles of carboplatin and paclitaxel. -She denies any new onset chest pains.  Denies any cough or hemoptysis. -Chest x-ray on 05/03/2019 showed no new consolidation.  Small left pleural effusion. -I have recommended surveillance CT scans once every 6 months.  We will set her up for a CT of the chest with contrast and labs. -We will see her back after scans and labs.  2.  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  She has been disease-free for more than 5 years.

## 2019-05-11 ENCOUNTER — Ambulatory Visit (HOSPITAL_COMMUNITY): Payer: Medicare Other | Attending: Thoracic Surgery (Cardiothoracic Vascular Surgery) | Admitting: Occupational Therapy

## 2019-05-16 ENCOUNTER — Other Ambulatory Visit: Payer: Self-pay

## 2019-05-16 ENCOUNTER — Ambulatory Visit (HOSPITAL_COMMUNITY)
Admission: RE | Admit: 2019-05-16 | Discharge: 2019-05-16 | Disposition: A | Discharge status: Home / Self Care | Payer: Medicare Other | Source: Ambulatory Visit | Attending: Hematology | Admitting: Hematology

## 2019-05-16 DIAGNOSIS — C3492 Malignant neoplasm of unspecified part of left bronchus or lung: Secondary | ICD-10-CM | POA: Insufficient documentation

## 2019-05-16 MED ORDER — IOHEXOL 300 MG/ML  SOLN
75.0000 mL | Freq: Once | INTRAMUSCULAR | Status: AC | PRN
  Administered 2019-05-16: 12:00:00 75 mL via INTRAVENOUS

## 2019-05-17 ENCOUNTER — Encounter (HOSPITAL_COMMUNITY): Payer: Self-pay | Admitting: Hematology

## 2019-05-17 ENCOUNTER — Inpatient Hospital Stay (HOSPITAL_BASED_OUTPATIENT_CLINIC_OR_DEPARTMENT_OTHER): Payer: Medicare Other | Admitting: Hematology

## 2019-05-17 ENCOUNTER — Other Ambulatory Visit (HOSPITAL_COMMUNITY): Payer: Self-pay | Admitting: *Deleted

## 2019-05-17 DIAGNOSIS — C3492 Malignant neoplasm of unspecified part of left bronchus or lung: Secondary | ICD-10-CM | POA: Diagnosis not present

## 2019-05-17 DIAGNOSIS — D509 Iron deficiency anemia, unspecified: Secondary | ICD-10-CM | POA: Diagnosis not present

## 2019-05-17 DIAGNOSIS — C3432 Malignant neoplasm of lower lobe, left bronchus or lung: Secondary | ICD-10-CM | POA: Diagnosis not present

## 2019-05-17 DIAGNOSIS — J90 Pleural effusion, not elsewhere classified: Secondary | ICD-10-CM | POA: Diagnosis not present

## 2019-05-17 DIAGNOSIS — I4891 Unspecified atrial fibrillation: Secondary | ICD-10-CM | POA: Diagnosis not present

## 2019-05-17 DIAGNOSIS — Z902 Acquired absence of lung [part of]: Secondary | ICD-10-CM | POA: Diagnosis not present

## 2019-05-17 DIAGNOSIS — C8580 Other specified types of non-Hodgkin lymphoma, unspecified site: Secondary | ICD-10-CM

## 2019-05-17 DIAGNOSIS — Z7901 Long term (current) use of anticoagulants: Secondary | ICD-10-CM | POA: Diagnosis not present

## 2019-05-17 NOTE — Patient Instructions (Signed)
[facility] at [facility] Discharge Instructions  You were seen today by Dr. Katragadda. He went over your recent lab results. He will see you back in  for labs and follow up.   Thank you for choosing [facility] at [facility] to provide your oncology and hematology care.  To afford each patient quality time with our provider, please arrive at least 15 minutes before your scheduled appointment time.   If you have a lab appointment with the Cancer Center please come in thru the  Main Entrance and check in at the main information desk  You need to re-schedule your appointment should you arrive 10 or more minutes late.  We strive to give you quality time with our providers, and arriving late affects you and other patients whose appointments are after yours.  Also, if you no show three or more times for appointments you may be dismissed from the clinic at the providers discretion.     Again, thank you for choosing [facility].  Our hope is that these requests will decrease the amount of time that you wait before being seen by our physicians.       _____________________________________________________________  Should you have questions after your visit to [facility], please contact our office at [phone] between the hours of 8:00 a.m. and 4:30 p.m.  Voicemails left after 4:00 p.m. will not be returned until the following business day.  For prescription refill requests, have your pharmacy contact our office and allow 72 hours.    Cancer Center Support Programs:   > Cancer Support Group  2nd Tuesday of the month 1pm-2pm, Journey Room    

## 2019-05-17 NOTE — Progress Notes (Signed)
[place], [place] [address]   CLINIC:  Medical Oncology/Hematology  PCP:  Celene Squibb, MD [place] [id number] [phone]   REASON FOR VISIT:  Squamous cell left lung cancer.   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   10/06/2013 PET scan   Hypermetabolic thickening within the right vallecular consists with primary head and neck cancer.  Single enlarged hypermetabolic right level IIa lymph node consistent local nodal metastasis.   10/31/2013 - 11/29/2013 Chemotherapy   Bendamustine/Rituxan x 2 cycles   12/19/2013 Progression   PET- Progression of presumed vallecular lymphoma as evidenced by increased hypermetabolism.   12/20/2013 Treatment Plan Change   Per Dr. Gari Crown, patient has a CD10 negative, non-germinal center non-Hodgkin's lymphoma.  Patient may be a candidate for additional of Revlimid to treatment plan.   12/28/2013 Echocardiogram   MUGA- Normal left ventricular wall motion with calculated ejection fraction of 60%.   01/02/2014 - 02/13/2014 Chemotherapy   R-CHOP with Neulasta support.   02/06/2014 Remission   PET- Resolution of the previously enlarged right-sided station 2 lymph  node in the neck with resolution of the abnormal right vallecular  activity in the neck. Currently no specific in findings of residual  lymphoma.    - 04/12/2014 Radiation Therapy   Dr. Lisbeth Renshaw   07/12/2014 Procedure   Port removed- Dr. Arnoldo Morale   Squamous cell lung cancer, left ([place])  04/20/2018 Initial Diagnosis   Squamous cell lung cancer, left ([place])   05/24/2018 -  Chemotherapy   The patient had palonosetron (ALOXI) injection 0.25 mg, 0.25 mg, Intravenous,  Once, 0 of 4 cycles pegfilgrastim-cbqv (UDENYCA) injection 6 mg, 6 mg, Subcutaneous, Once, 0 of 4 cycles CARBOplatin  (PARAPLATIN) in sodium chloride 0.9 % 100 mL chemo infusion, , Intravenous,  Once, 0 of 4 cycles Dose modification:   (Cycle 4) PACLitaxel (TAXOL) 252 mg in sodium chloride 0.9 % 250 mL chemo infusion (> 80mg /m2), 175 mg/m2 = 252 mg (original dose ), Intravenous,  Once, 0 of 4 cycles Dose modification: 175 mg/m2 (Cycle 1, Reason: Provider Judgment) fosaprepitant (EMEND) 150 mg, dexamethasone (DECADRON) 20 mg in sodium chloride 0.9 % 145 mL IVPB, , Intravenous,  Once, 0 of 4 cycles  for chemotherapy treatment.       CANCER STAGING: Cancer Staging Lymphoma malignant, large cell [facility]) Staging form: Lymphoid Neoplasms, AJCC 6th Edition - Clinical stage from 01/22/2014: Stage IE - Signed by Baird Cancer, PA-C on 04/07/2016    INTERVAL HISTORY:  Ms. Renwick 79 y.o. female seen for follow-up of lung cancer and anemia.  She underwent CT scan yesterday.  Denies any bleeding per rectum or melena.  Appetite is 100%.  Energy levels are 75%.  No cough or hemoptysis noted.  REVIEW OF SYSTEMS:  Review of Systems  All other systems reviewed and are negative.    PAST MEDICAL/SURGICAL HISTORY:  Past Medical History:  Diagnosis Date  . Atrial fibrillation, chronic ([place]) 2004   left atrial thrombus in 2004; near syncope in 2008; echocardiogram in 2008-normal EF, mild left atrial enlargement, no valvular abnormalities, lipomatous hypertrophy; no aortic atheroma on TEE in 2004  . Cancer (HCC)    LYMPH NODE    and lung cancer  . Chronic anticoagulation 2004   warfarin  . COPD (chronic obstructive pulmonary disease) (  [place])    denies  . Depression   . DJD (degenerative joint disease), lumbosacral    Also hip  . Dysrhythmia    chronic AFib  . Family history of adverse reaction to anesthesia    sister PONV  . Fracture of wrist    Left  . History of kidney stones    once  . Hypotension   . Panic attacks   . Peptic ulcer disease    s/p Billroth II  . PONV (postoperative nausea and vomiting)    . Tobacco abuse    40 pack years; quit attempt in 2012   Past Surgical History:  Procedure Laterality Date  . ABDOMINAL HYSTERECTOMY     w/o oophorectomy  partial  . APPENDECTOMY    . CESAREAN SECTION    . CHOLECYSTECTOMY  2004   [place]   patient denies ever having undergone colonoscopy as of 2014  . GASTROJEJUNOSTOMY  1984   Billroth II; splenectomy; incidental appendectomy  . KNEE ARTHROSCOPY WITH MEDIAL MENISECTOMY Right 02/26/2015   Procedure: KNEE ARTHROSCOPY WITH MEDIAL MENISECTOMY;  Surgeon: Sanjuana Kava, MD;  Location: [facility];  Service: Orthopedics;  Laterality: Right;  . LYMPH NODE BIOPSY Left 09/15/2013   Procedure: LEFT NECK CERVICAL NODE BIOPSY;  Surgeon: Scherry Ran, MD;  Location: [facility];  Service: General;  Laterality: Left;  . PORT-A-CATH REMOVAL Right 07/12/2014   Procedure: REMOVAL PORT-A-CATH;  Surgeon: Aviva Signs Md, MD;  Location: [facility];  Service: General;  Laterality: Right;  . PORTACATH PLACEMENT Right 10/21/2013   Procedure: ATTEMPTED INSERTION PORT-A-CATH;  Surgeon: Scherry Ran, MD;  Location: [facility];  Service: General;  Laterality: Right;  . SPLENECTOMY, TOTAL    . THORACOSCOPY Left 04/15/2018   VIDEO ASSISTED THORACOSCOPY (VATS)/LEFT LOWER LOBECTOMY (Left)  . 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  [name] TOTAL HIP ARTHROPLASTY Left 10/18/2018   Procedure: TOTAL HIP ARTHROPLASTY ANTERIOR APPROACH;  Surgeon: Gaynelle Arabian, MD;  Location: [facility];  Service: Orthopedics;  Laterality: Left;  163min  . VIDEO ASSISTED THORACOSCOPY (VATS)/ LOBECTOMY Left 04/15/2018   Procedure: VIDEO ASSISTED THORACOSCOPY (VATS)/LEFT LOWER LOBECTOMY;  Surgeon: Melrose Nakayama, MD;  Location: [facility];  Service: Thoracic;  Laterality: Left;  . WEDGE RESECTION Left 04/15/2018   Procedure: LEFT UPPER WEDGE RESECTION;  Surgeon: Melrose Nakayama, MD;  [facility];  Service: Thoracic;  Laterality: Left;     SOCIAL HISTORY:   Social History   Socioeconomic History  . Marital status: Married    Spouse name: Not on file  . Number of children: 2  . Years of education: Not on file  . Highest education level: Not on file  Occupational History  . Occupation: Housecleaning  Tobacco Use  . Smoking status: Former Smoker    Packs/day: 0.50    Years: 55.00    Pack years: 27.50    Types: Cigarettes  . Smokeless tobacco: Never Used  . Tobacco comment: quit quit in june  Substance and Sexual Activity  . Alcohol use: No  . Drug use: No  . Sexual activity: Yes    Birth control/protection: Surgical  Other Topics Concern  . Not on file  Social History Narrative   Married with one child and one adopted child, 1 stillbirth, and one deceased child at age 89 due to illness   No regular exercise   Social Determinants of Health   Financial Resource Strain:   . Difficulty  of Paying Living Expenses:   Food Insecurity:   . Worried About Charity fundraiser in the Last Year:   . Arboriculturist in the Last Year:   Transportation Needs:   . Film/video editor (Medical):   [name] Lack of Transportation (Non-Medical):   Physical Activity:   . Days of Exercise per Week:   . Minutes of Exercise per Session:   Stress:   . Feeling of Stress :   Social Connections:   . Frequency of Communication with Friends and Family:   . Frequency of Social Gatherings with Friends and Family:   . Attends Religious Services:   . Active Member of Clubs or Organizations:   . Attends Archivist Meetings:   [name] Marital Status:   Intimate Partner Violence:   . Fear of Current or Ex-Partner:   . Emotionally Abused:   [name] Physically Abused:   . Sexually Abused:     FAMILY HISTORY:  Family History  Problem Relation Age of Onset  . Heart attack Mother        also brother  . Breast cancer Sister   . Heart attack Brother   . Breast cancer Sister        x2  . Cancer Brother        Gastric carcinoma; also father  . Cirrhosis  Sister     CURRENT MEDICATIONS:  Outpatient Encounter Medications as of 05/17/2019  Medication Sig  . acetaminophen (TYLENOL) 500 MG tablet Take 1,000 mg by mouth every 6 (six) hours as needed for moderate pain or headache.   . ALPRAZolam (XANAX) 1 MG tablet Take 1 tablet (1 mg total) by mouth 3 (three) times daily as needed for anxiety. (Patient taking differently: Take 1 mg by mouth at bedtime. )  . citalopram (CELEXA) 20 MG tablet Take 20 mg by mouth daily.  [name] warfarin (COUMADIN) 5 MG tablet Take 1/2 tablet daily except 1 tablet on Sundays, Tuesdays and Thursdays (Patient taking differently: Take 2.5-5 mg by mouth See admin instructions. Take 2.5mg  on Mondays, Wednesdays, and Fridays. Take 5mg  on all other days of the week.)  . diltiazem (CARDIZEM CD) 240 MG 24 hr capsule Take 1 capsule (240 mg total) by mouth daily.   No facility-administered encounter medications on file as of 05/17/2019.    ALLERGIES:  Allergies  Allergen Reactions  . Estrogens Swelling    edema  . Codeine Swelling and Rash  . Penicillins Other (See Comments)    Break out in welts. Did it involve swelling of the face/tongue/throat, SOB, or low BP? No Did it involve sudden or severe rash/hives, skin peeling, or any reaction on the inside of your mouth or nose? No Did you need to seek medical attention at a hospital or doctor's office? No When did it last happen? Childhood allergy If all above answers are "NO", may proceed with cephalosporin use.   . Diphenhydramine Hcl Other (See Comments)    hallucinations      PHYSICAL EXAM:  ECOG Performance status: 1  Vitals:   05/17/19 0900  BP: 120/89  Pulse: 80  Resp: 16  Temp: (!) 97.1 F (36.2 C)  SpO2: 91%   Filed Weights   05/17/19 0900  Weight: 147 lb 1 oz (66.7 kg)    Physical Exam Vitals reviewed.  Constitutional:      Appearance: Normal appearance.  Cardiovascular:     Rate and Rhythm: Normal rate and regular rhythm.     Heart  sounds:  Normal heart sounds.  Pulmonary:     Effort: Pulmonary effort is normal.     Breath sounds: Normal breath sounds.  Abdominal:     General: Bowel sounds are normal. There is no distension.     Palpations: Abdomen is soft. There is no mass.  Musculoskeletal:        General: No swelling.  Skin:    General: Skin is warm.  Neurological:     General: No focal deficit present.     Mental Status: She is alert and oriented to person, place, and time.  Psychiatric:        Mood and Affect: Mood normal.        Behavior: Behavior normal.      LABORATORY DATA:  I have reviewed the labs as listed.  CBC    Component Value Date/Time   WBC 12.6 (H) 05/09/2019 1559   RBC 4.20 05/09/2019 1559   HGB 9.4 (L) 05/09/2019 1559   HCT 32.6 (L) 05/09/2019 1559   PLT 434 (H) 05/09/2019 1559   MCV 77.6 (L) 05/09/2019 1559   MCH 22.4 (L) 05/09/2019 1559   MCHC 28.8 (L) 05/09/2019 1559   RDW 20.3 (H) 05/09/2019 1559   LYMPHSABS 4.7 (H) 05/09/2019 1559   MONOABS 1.2 (H) 05/09/2019 1559   EOSABS 0.6 (H) 05/09/2019 1559   BASOSABS 0.1 05/09/2019 1559   CMP Latest Ref Rng & Units 05/09/2019 10/19/2018 10/15/2018  Glucose 70 - 99 mg/dL 98 167(H) 100(H)  BUN 8 - 23 mg/dL 26(H) 15 19  Creatinine 0.44 - 1.00 mg/dL 1.13(H) 0.59 0.68  Sodium 135 - 145 mmol/L 137 135 141  Potassium 3.5 - 5.1 mmol/L 3.7 4.4 4.4  Chloride 98 - 111 mmol/L 102 100 103  CO2 22 - 32 mmol/L 26 25 30   Calcium 8.9 - 10.3 mg/dL 8.2(L) 8.2(L) 8.9  Total Protein 6.5 - 8.1 g/dL 7.1 - 7.6  Total Bilirubin 0.3 - 1.2 mg/dL 0.4 - 0.4  Alkaline Phos 38 - 126 U/L 77 - 81  AST 15 - 41 U/L 19 - 24  ALT 0 - 44 U/L 17 - 16       DIAGNOSTIC IMAGING:  I have reviewed scans.   I have reviewed Venita Lick LPN's note and agree with the documentation.  I personally performed a face-to-face visit, made revisions and my assessment and plan is as follows.    ASSESSMENT & PLAN:   Squamous cell lung cancer, left ([place]) 1.  T2N0 squamous cell  carcinoma left lung: -VATS and left lower lobectomy on 04/15/2018 with pathology showing poorly differentiated squamous cell carcinoma, 4 cm with no visceral pleural invasion, lymph nodes negative. -She declined adjuvant chemotherapy. -I reviewed CT scan from 05/16/2019.  Very small left pleural effusion present.  Stable precarinal lymph node measuring 1.5 cm.  No evidence of recurrence. -We will plan to repeat CT scan in 6 months.  2.  Microcytic anemia: -Labs on 05/09/2019 shows hemoglobin 9.4 with MCV 77. -She has tried taking oral iron in the past and caused severe constipation.  Last Feraheme was in 2015. -I have recommended to Feraheme infusions 1 week apart.  I plan to repeat CBC and ferritin, iron panel in 3 months.  3.  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  She has been disease-free for more than 5 years.  4.  Atrial fibrillation: -She is on Coumadin and follows up with the clinic in [place]. -She  reports that she has been unable to eat all sorts of foods because of possible interaction with Coumadin.  I have suggested her to be switched to Eliquis.  She will talk to Dr. Nevada Crane about it.  Orders placed this encounter:  No orders of the defined types were placed in this encounter.     Derek Jack, MD [place] [phone]

## 2019-05-17 NOTE — Assessment & Plan Note (Addendum)
1.  T2N0 squamous cell carcinoma left lung: -VATS and left lower lobectomy on 04/15/2018 with pathology showing poorly differentiated squamous cell carcinoma, 4 cm with no visceral pleural invasion, lymph nodes negative. -She declined adjuvant chemotherapy. -I reviewed CT scan from 05/16/2019.  Very small left pleural effusion present.  Stable precarinal lymph node measuring 1.5 cm.  No evidence of recurrence. -We will plan to repeat CT scan in 6 months.  2.  Microcytic anemia: -Labs on 05/09/2019 shows hemoglobin 9.4 with MCV 77. -She has tried taking oral iron in the past and caused severe constipation.  Last Feraheme was in 2015. -I have recommended to Feraheme infusions 1 week apart.  I plan to repeat CBC and ferritin, iron panel in 3 months.  3.  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  She has been disease-free for more than 5 years.  4.  Atrial fibrillation: -She is on Coumadin and follows up with the clinic in [place]. -She reports that she has been unable to eat all sorts of foods because of possible interaction with Coumadin.  I have suggested her to be switched to Eliquis.  She will talk to Dr. Nevada Crane about it.

## 2019-05-18 ENCOUNTER — Other Ambulatory Visit: Payer: Self-pay

## 2019-05-18 ENCOUNTER — Ambulatory Visit (INDEPENDENT_AMBULATORY_CARE_PROVIDER_SITE_OTHER): Payer: Medicare Other | Admitting: *Deleted

## 2019-05-18 DIAGNOSIS — Z5181 Encounter for therapeutic drug level monitoring: Secondary | ICD-10-CM | POA: Diagnosis not present

## 2019-05-18 DIAGNOSIS — I482 Chronic atrial fibrillation, unspecified: Secondary | ICD-10-CM

## 2019-05-18 LAB — POCT INR: INR: 2.1 (ref 2.0–3.0)

## 2019-05-18 NOTE — Patient Instructions (Signed)
Continue warfarin 1 tablet daily except 1/2 tablet on Mondays and Fridays. Recheck in 4 weeks Keep greens consistent

## 2019-05-23 ENCOUNTER — Other Ambulatory Visit: Payer: Self-pay

## 2019-05-23 ENCOUNTER — Inpatient Hospital Stay (HOSPITAL_COMMUNITY): Payer: Medicare Other

## 2019-05-23 ENCOUNTER — Encounter (HOSPITAL_COMMUNITY): Payer: Self-pay

## 2019-05-23 VITALS — BP 121/56 | HR 64 | Temp 96.9°F | Resp 18

## 2019-05-23 DIAGNOSIS — Z902 Acquired absence of lung [part of]: Secondary | ICD-10-CM | POA: Diagnosis not present

## 2019-05-23 DIAGNOSIS — E611 Iron deficiency: Secondary | ICD-10-CM

## 2019-05-23 DIAGNOSIS — D509 Iron deficiency anemia, unspecified: Secondary | ICD-10-CM | POA: Diagnosis not present

## 2019-05-23 DIAGNOSIS — I4891 Unspecified atrial fibrillation: Secondary | ICD-10-CM | POA: Diagnosis not present

## 2019-05-23 DIAGNOSIS — Z7901 Long term (current) use of anticoagulants: Secondary | ICD-10-CM | POA: Diagnosis not present

## 2019-05-23 DIAGNOSIS — J90 Pleural effusion, not elsewhere classified: Secondary | ICD-10-CM | POA: Diagnosis not present

## 2019-05-23 DIAGNOSIS — C3432 Malignant neoplasm of lower lobe, left bronchus or lung: Secondary | ICD-10-CM | POA: Diagnosis not present

## 2019-05-23 MED ORDER — SODIUM CHLORIDE 0.9 % IV SOLN
510.0000 mg | Freq: Once | INTRAVENOUS | Status: AC
  Administered 2019-05-23: 510 mg via INTRAVENOUS
  Filled 2019-05-23: qty 510

## 2019-05-23 MED ORDER — SODIUM CHLORIDE 0.9 % IV SOLN: Freq: Once | INTRAVENOUS | Status: AC

## 2019-05-23 NOTE — Patient Instructions (Signed)
[facility] Discharge Instructions  Received Feraheme infusion today. Follow-up as scheduled. Call clinic for any questions or concerns   Thank you for choosing [facility] at [facility] to provide your oncology and hematology care.  To afford each patient quality time with our provider, please arrive at least 15 minutes before your scheduled appointment time.   If you have a lab appointment with the Cancer Center please come in thru the Main Entrance and check in at the main information desk.  You need to re-schedule your appointment should you arrive 10 or more minutes late.  We strive to give you quality time with our providers, and arriving late affects you and other patients whose appointments are after yours.  Also, if you no show three or more times for appointments you may be dismissed from the clinic at the providers discretion.     Again, thank you for choosing [facility].  Our hope is that these requests will decrease the amount of time that you wait before being seen by our physicians.       _____________________________________________________________  Should you have questions after your visit to [facility], please contact our office at [phone] between the hours of 8:00 a.m. and 4:30 p.m.  Voicemails left after 4:00 p.m. will not be returned until the following business day.  For prescription refill requests, have your pharmacy contact our office and allow 72 hours.    Due to Covid, you will need to wear a mask upon entering the hospital. If you do not have a mask, a mask will be given to you at the Main Entrance upon arrival. For doctor visits, patients may have 1 support person with them. For treatment visits, patients can not have anyone with them due to social distancing guidelines and our immunocompromised population.     

## 2019-05-23 NOTE — Progress Notes (Signed)
Marissa Turner tolerated Feraheme infusion well without complaints or incident. Peripheral IV site checked with positive blood return noted prior to and after infusion. VSS upon discharge. Pt discharged via wheelchair in satisfactory condition

## 2019-05-30 ENCOUNTER — Other Ambulatory Visit: Payer: Self-pay

## 2019-05-30 ENCOUNTER — Inpatient Hospital Stay (HOSPITAL_COMMUNITY): Payer: Medicare Other | Attending: Hematology

## 2019-05-30 ENCOUNTER — Encounter (HOSPITAL_COMMUNITY): Payer: Self-pay

## 2019-05-30 VITALS — BP 131/75 | HR 72 | Temp 97.3°F | Resp 18

## 2019-05-30 DIAGNOSIS — Z8572 Personal history of non-Hodgkin lymphomas: Secondary | ICD-10-CM | POA: Diagnosis not present

## 2019-05-30 DIAGNOSIS — D509 Iron deficiency anemia, unspecified: Secondary | ICD-10-CM | POA: Insufficient documentation

## 2019-05-30 DIAGNOSIS — E611 Iron deficiency: Secondary | ICD-10-CM

## 2019-05-30 DIAGNOSIS — C3432 Malignant neoplasm of lower lobe, left bronchus or lung: Secondary | ICD-10-CM | POA: Insufficient documentation

## 2019-05-30 DIAGNOSIS — Z87891 Personal history of nicotine dependence: Secondary | ICD-10-CM | POA: Insufficient documentation

## 2019-05-30 DIAGNOSIS — Z902 Acquired absence of lung [part of]: Secondary | ICD-10-CM | POA: Diagnosis not present

## 2019-05-30 DIAGNOSIS — Z9221 Personal history of antineoplastic chemotherapy: Secondary | ICD-10-CM | POA: Insufficient documentation

## 2019-05-30 DIAGNOSIS — Z923 Personal history of irradiation: Secondary | ICD-10-CM | POA: Insufficient documentation

## 2019-05-30 MED ORDER — SODIUM CHLORIDE 0.9 % IV SOLN
510.0000 mg | Freq: Once | INTRAVENOUS | Status: AC
  Administered 2019-05-30: 510 mg via INTRAVENOUS
  Filled 2019-05-30: qty 17

## 2019-05-30 MED ORDER — SODIUM CHLORIDE 0.9 % IV SOLN: Freq: Once | INTRAVENOUS | Status: AC

## 2019-05-30 NOTE — Progress Notes (Signed)
Iron given per orders. Patient tolerated it well without problems. Vitals stable and discharged home from clinic via wheelchair. Follow up as scheduled.  

## 2019-05-30 NOTE — Patient Instructions (Signed)
[facility]  Discharge Instructions:   _______________________________________________________________  Thank you for choosing [facility] at [facility] to provide your oncology and hematology care.  To afford each patient quality time with our providers, please arrive at least 15 minutes before your scheduled appointment.  You need to re-schedule your appointment if you arrive 10 or more minutes late.  We strive to give you quality time with our providers, and arriving late affects you and other patients whose appointments are after yours.  Also, if you no show three or more times for appointments you may be dismissed from the clinic.  Again, thank you for choosing [facility] at [facility]. Our hope is that these requests will allow you access to exceptional care and in a timely manner. _______________________________________________________________  If you have questions after your visit, please contact our office at [phone] between the hours of 8:30 a.m. and 5:00 p.m. Voicemails left after 4:30 p.m. will not be returned until the following business day. _______________________________________________________________  For prescription refill requests, have your pharmacy contact our office. _______________________________________________________________  Recommendations made by the consultant and any test results will be sent to your referring physician. _______________________________________________________________ 

## 2019-06-09 DIAGNOSIS — C44319 Basal cell carcinoma of skin of other parts of face: Secondary | ICD-10-CM | POA: Diagnosis not present

## 2019-06-09 DIAGNOSIS — L570 Actinic keratosis: Secondary | ICD-10-CM | POA: Diagnosis not present

## 2019-06-09 DIAGNOSIS — X32XXXD Exposure to sunlight, subsequent encounter: Secondary | ICD-10-CM | POA: Diagnosis not present

## 2019-06-09 DIAGNOSIS — Z08 Encounter for follow-up examination after completed treatment for malignant neoplasm: Secondary | ICD-10-CM | POA: Diagnosis not present

## 2019-06-09 DIAGNOSIS — Z85828 Personal history of other malignant neoplasm of skin: Secondary | ICD-10-CM | POA: Diagnosis not present

## 2019-06-14 DIAGNOSIS — C3492 Malignant neoplasm of unspecified part of left bronchus or lung: Secondary | ICD-10-CM | POA: Diagnosis not present

## 2019-06-14 DIAGNOSIS — M25561 Pain in right knee: Secondary | ICD-10-CM | POA: Diagnosis not present

## 2019-06-14 DIAGNOSIS — F17211 Nicotine dependence, cigarettes, in remission: Secondary | ICD-10-CM | POA: Diagnosis not present

## 2019-06-14 DIAGNOSIS — F411 Generalized anxiety disorder: Secondary | ICD-10-CM | POA: Diagnosis not present

## 2019-06-14 DIAGNOSIS — I482 Chronic atrial fibrillation, unspecified: Secondary | ICD-10-CM | POA: Diagnosis not present

## 2019-06-14 DIAGNOSIS — D509 Iron deficiency anemia, unspecified: Secondary | ICD-10-CM | POA: Diagnosis not present

## 2019-06-14 DIAGNOSIS — Z7901 Long term (current) use of anticoagulants: Secondary | ICD-10-CM | POA: Diagnosis not present

## 2019-06-14 DIAGNOSIS — I10 Essential (primary) hypertension: Secondary | ICD-10-CM | POA: Diagnosis not present

## 2019-06-14 DIAGNOSIS — R32 Unspecified urinary incontinence: Secondary | ICD-10-CM | POA: Diagnosis not present

## 2019-06-14 DIAGNOSIS — R7301 Impaired fasting glucose: Secondary | ICD-10-CM | POA: Diagnosis not present

## 2019-06-14 DIAGNOSIS — Z720 Tobacco use: Secondary | ICD-10-CM | POA: Diagnosis not present

## 2019-06-14 DIAGNOSIS — M1990 Unspecified osteoarthritis, unspecified site: Secondary | ICD-10-CM | POA: Diagnosis not present

## 2019-06-15 ENCOUNTER — Other Ambulatory Visit: Payer: Self-pay

## 2019-06-15 ENCOUNTER — Ambulatory Visit (INDEPENDENT_AMBULATORY_CARE_PROVIDER_SITE_OTHER): Payer: Medicare Other | Admitting: Pharmacist

## 2019-06-15 DIAGNOSIS — I482 Chronic atrial fibrillation, unspecified: Secondary | ICD-10-CM

## 2019-06-15 DIAGNOSIS — Z5181 Encounter for therapeutic drug level monitoring: Secondary | ICD-10-CM

## 2019-06-15 LAB — POCT INR: INR: 1.9 — AB (ref 2.0–3.0)

## 2019-06-15 MED ORDER — APIXABAN 5 MG PO TABS: 5.0000 mg | ORAL_TABLET | Freq: Two times a day (BID) | ORAL | 5 refills | Status: DC

## 2019-06-15 NOTE — Patient Instructions (Signed)
Description   Stop taking warfarin. Start taking Eliquis 5mg  twice a day. Your first dose will be this evening. Space your doses apart every 12 hours. Your first month will be free, follow up copays will be $47 per month.

## 2019-07-07 DIAGNOSIS — M25561 Pain in right knee: Secondary | ICD-10-CM | POA: Diagnosis not present

## 2019-07-07 DIAGNOSIS — M1711 Unilateral primary osteoarthritis, right knee: Secondary | ICD-10-CM | POA: Diagnosis not present

## 2019-07-12 ENCOUNTER — Telehealth: Payer: Self-pay

## 2019-07-12 NOTE — Telephone Encounter (Signed)
Called an scheduled an appt for tomorrow 07/13/2019 1:30 PM OFFICE VISIT Verta Ellen., NP. Pt will arrive early.

## 2019-07-12 NOTE — Telephone Encounter (Signed)
Primary Cardiologist:Suresh Bronson Ing, MD  Chart reviewed as part of pre-operative protocol coverage. Because of Marissa Turner's past medical history and time since last visit, he/she will require a follow-up visit in order to better assess preoperative cardiovascular risk.  Pre-op covering staff: - Please schedule appointment and call patient to inform them. - Please contact requesting surgeon's office via preferred method (i.e, phone, fax) to inform them of need for appointment prior to surgery.  If applicable, this message will also be routed to pharmacy pool and/or primary cardiologist for input on holding anticoagulant/antiplatelet agent as requested below so that this information is available at time of patient's appointment.   Deberah Pelton, NP  07/12/2019, 2:39 PM

## 2019-07-12 NOTE — Telephone Encounter (Signed)
   [facility] Pre-operative Risk Assessment    HEARTCARE STAFF: - Please ensure there is not already an duplicate clearance open for this procedure. - Under Visit Info/Reason for Call, type in Other and utilize the format Clearance MM/DD/YY or Clearance TBD. Do not use dashes or single digits. - If request is for dental extraction, please clarify the # of teeth to be extracted.  Request for surgical clearance:  1. What type of surgery is being performed? Right total knee arthroplasty   2. When is this surgery scheduled? 07/25/19   3. What type of clearance is required (medical clearance vs. Pharmacy clearance to hold med vs. Both)? Pharmacy  4. Are there any medications that need to be held prior to surgery and how long? Eliquis   5. Practice name and name of physician performing surgery? [facility]/Dr. Pilar Plate Allusio   6. What is the office phone number? [phone]   7.   What is the office fax number? [phone]  8.   Anesthesia type (None, local, MAC, general) ? choice   Mady Haagensen 07/12/2019, 2:30 PM  _________________________________________________________________   (provider comments below)

## 2019-07-13 ENCOUNTER — Encounter: Payer: Self-pay | Admitting: Family Medicine

## 2019-07-13 ENCOUNTER — Ambulatory Visit (INDEPENDENT_AMBULATORY_CARE_PROVIDER_SITE_OTHER): Payer: Medicare Other | Admitting: Family Medicine

## 2019-07-13 VITALS — BP 110/70 | HR 74 | Ht 60.0 in | Wt 143.6 lb

## 2019-07-13 DIAGNOSIS — I4821 Permanent atrial fibrillation: Secondary | ICD-10-CM | POA: Diagnosis not present

## 2019-07-13 DIAGNOSIS — I10 Essential (primary) hypertension: Secondary | ICD-10-CM | POA: Diagnosis not present

## 2019-07-13 DIAGNOSIS — Z01810 Encounter for preprocedural cardiovascular examination: Secondary | ICD-10-CM | POA: Diagnosis not present

## 2019-07-13 DIAGNOSIS — R9439 Abnormal result of other cardiovascular function study: Secondary | ICD-10-CM

## 2019-07-13 NOTE — Progress Notes (Signed)
Cardiology Office Note  Date: 07/13/2019   ID: Marissa, Turner 1940-06-25, MRN [id number]  PCP:  Celene Squibb, MD  Cardiologist:  Kate Sable, MD Electrophysiologist:  None   Chief Complaint: Pre op clearance for RTKA Dr. Maureen Ralphs  History of Present Illness: Marissa Turner is a 79 y.o. 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    Patient here for preop clearance for right total knee arthroplasty with Dr. Wynelle Link on July 25, 2019.  Patient denies any recent anginal or exertional symptoms, palpitations or arrhythmias although she is in atrial fibrillation, denies any orthostatic symptoms, PND or orthopnea, bleeding issues, claudication-like symptoms.  Does have significant right knee pain due to degenerative arthritis.  No DVT or PE-like symptoms.  No lower extremity edema noted.  Denies any recent acute illnesses, hospitalizations.  Past Medical History:  Diagnosis Date  . Atrial fibrillation, chronic ([place]) 2004   left atrial thrombus in 2004; near syncope in 2008; echocardiogram in 2008-normal EF, mild left atrial enlargement, no valvular abnormalities, lipomatous hypertrophy; no aortic atheroma on TEE in 2004  . Cancer (HCC)    LYMPH NODE    and lung cancer  . Chronic anticoagulation 2004   warfarin  . COPD (chronic obstructive pulmonary disease) ([place])    denies  . Depression   . DJD (degenerative joint disease), lumbosacral    Also hip  . Dysrhythmia    chronic AFib  . Family history of adverse reaction to anesthesia    sister PONV  . Fracture of wrist    Left  . History of kidney stones    once  . Hypotension   . Panic attacks   . Peptic ulcer disease    s/p Billroth II  . PONV (postoperative nausea and vomiting)   . Tobacco abuse    40 pack  years; quit attempt in 2012    Past Surgical History:  Procedure Laterality Date  . ABDOMINAL HYSTERECTOMY     w/o oophorectomy  partial  . APPENDECTOMY    . CESAREAN SECTION    . CHOLECYSTECTOMY  2004   [place]   patient denies ever having undergone colonoscopy as of 2014  . GASTROJEJUNOSTOMY  1984   Billroth II; splenectomy; incidental appendectomy  . KNEE ARTHROSCOPY WITH MEDIAL MENISECTOMY Right 02/26/2015   Procedure: KNEE ARTHROSCOPY WITH MEDIAL MENISECTOMY;  Surgeon: Sanjuana Kava, MD;  Location: [facility];  Service: Orthopedics;  Laterality: Right;  . LYMPH NODE BIOPSY Left 09/15/2013   Procedure: LEFT NECK CERVICAL NODE BIOPSY;  Surgeon: Scherry Ran, MD;  Location: [facility];  Service: General;  Laterality: Left;  . PORT-A-CATH REMOVAL Right 07/12/2014   Procedure: REMOVAL PORT-A-CATH;  Surgeon: Aviva Signs Md, MD;  Location: [facility];  Service: General;  Laterality: Right;  . PORTACATH PLACEMENT Right 10/21/2013   Procedure: ATTEMPTED INSERTION PORT-A-CATH;  Surgeon: Scherry Ran, MD;  Location: [facility];  Service: General;  Laterality: Right;  . SPLENECTOMY, TOTAL    . THORACOSCOPY Left 04/15/2018   VIDEO ASSISTED THORACOSCOPY (VATS)/LEFT LOWER LOBECTOMY (Left)  . 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  [name] TOTAL HIP ARTHROPLASTY Left 10/18/2018   Procedure: TOTAL HIP ARTHROPLASTY ANTERIOR APPROACH;  Surgeon: Gaynelle Arabian, MD;  Location: [facility];  Service: Orthopedics;  Laterality: Left;  173min  . VIDEO ASSISTED THORACOSCOPY (VATS)/ LOBECTOMY Left 04/15/2018   Procedure: VIDEO ASSISTED THORACOSCOPY (VATS)/LEFT LOWER LOBECTOMY;  Surgeon: Melrose Nakayama, MD;  Location: [facility];  Service: Thoracic;  Laterality: Left;  . WEDGE RESECTION Left 04/15/2018   Procedure: LEFT UPPER WEDGE RESECTION;  Surgeon: Melrose Nakayama, MD;  [facility];  Service: Thoracic;  Laterality: Left;    Current Outpatient Medications  Medication  Sig Dispense Refill  . acetaminophen (TYLENOL) 500 MG tablet Take 1,000 mg by mouth every 6 (six) hours as needed for moderate pain or headache.     . ALPRAZolam (XANAX) 1 MG tablet Take 1 tablet (1 mg total) by mouth 3 (three) times daily as needed for anxiety. (Patient taking differently: Take 1 mg by mouth at bedtime. ) 30 tablet 0  . apixaban (ELIQUIS) 5 MG TABS tablet Take 1 tablet (5 mg total) by mouth 2 (two) times daily. 60 tablet 5  . Ascorbic Acid (VITAMIN C WITH ROSE HIPS) 500 MG tablet Take 500 mg by mouth daily.    . cholecalciferol (VITAMIN D3) 25 MCG (1000 UNIT) tablet Take 1,000 Units by mouth daily.    . citalopram (CELEXA) 20 MG tablet Take 20 mg by mouth daily.    [name] diltiazem (CARDIZEM CD) 240 MG 24 hr capsule Take 1 capsule (240 mg total) by mouth daily. 90 capsule 3   No current facility-administered medications for this visit.   Allergies:  Estrogens, Codeine, Penicillins, and Diphenhydramine hcl   Social History: The patient  reports that she has quit smoking. Her smoking use included cigarettes. She has a 27.50 pack-year smoking history. She has never used smokeless tobacco. She reports that she does not drink alcohol and does not use drugs.   Family History: The patient's family history includes Breast cancer in her sister and sister; Cancer in her brother; Cirrhosis in her sister; Heart attack in her brother and mother.   ROS:  Please see the history of present illness. Otherwise, complete review of systems is positive for none.  All other systems are reviewed and negative.   Physical Exam: VS:  BP 110/70   Pulse 74   Ht 5' (1.524 m)   Wt 143 lb 9.6 oz (65.1 kg)   SpO2 95%   BMI 28.04 kg/m , BMI Body mass index is 28.04 kg/m.  Wt Readings from Last 3 Encounters:  07/13/19 143 lb 9.6 oz (65.1 kg)  05/17/19 147 lb 1 oz (66.7 kg)  05/09/19 144 lb (65.3 kg)    General: Patient appears comfortable at rest. Neck: Supple, no elevated JVP or carotid bruits, no  thyromegaly. Lungs: Clear to auscultation, nonlabored breathing at rest. Cardiac: Irregularly irregular rate and rhythm, no S3 or significant systolic murmur, no pericardial rub. Extremities: No pitting edema, distal pulses 2+. Skin: Warm and dry. Musculoskeletal: No kyphosis. Neuropsychiatric: Alert and oriented x3, affect grossly appropriate.  ECG:  An ECG dated 07/13/2019 was personally reviewed today and demonstrated:  Atrial fibrillation rate of 74, ST and T wave abnormality, consider anterior ischemia or digitalis effect, prolonged QT with QT/QTc 462/512 ms.  Recent Labwork: 05/09/2019: ALT 17; AST 19; BUN 26; Creatinine, Ser 1.13; Hemoglobin 9.4; Platelets 434; Potassium 3.7; Sodium 137     Component Value Date/Time   CHOL 179 06/30/2013 0840   TRIG 173 (H) 06/30/2013 0840   HDL 37 (L) 06/30/2013 0840   CHOLHDL 4.8 06/30/2013 0840   VLDL 35 06/30/2013 0840  [place] 107 (H) 06/30/2013 0840    Other Studies Reviewed Today:  Nuclear stress test 01/12/2018:   There was no ST segment deviation noted during stress.  This is a low risk study.  The left ventricular ejection fraction is normal (55-65%).  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 This area may be affected by artifact due to adjacent gut radiotracer uptake. Overall findings are low risk  Assessment and Plan:  1. Encounter for pre-operative cardiovascular clearance   2. Permanent atrial fibrillation ([place])   3. Essential hypertension   4. Abnormal nuclear stress test    1. Encounter for pre-operative cardiovascular clearance Pt is pending a right total knee arthroplasty Dr. Maureen Ralphs.  Revised cardiac risk index = 0 placing the patient at perioperative risk of major cardiac event at 0.4%.  From a cardiac standpoint patient is clear to undergo right TKA.  Must hold Eliquis 5 days prior to procedure and resume at surgeon's discretion after procedure.  2. Permanent  atrial fibrillation (HCC) EKG today shows atrial fibrillation with a rate of 74.  Continue Cardizem CD 240 mg daily and Eliquis 5 mg p.o. twice daily.  3. Essential hypertension Blood pressure well controlled on current medications.  Blood pressure today 110/70.  Continue Cardizem 240 mg  4. Abnormal nuclear stress test Low risk stress test. Results may have been affected by adjacent GI uptake of trace. No angina, EF normal 55 to 65%.  See results above   Medication Adjustments/Labs and Tests Ordered: Current medicines are reviewed at length with the patient today.  Concerns regarding medicines are outlined above.   Disposition: Follow-up with [facility] or APP 1 year  Signed, Levell July, NP 07/13/2019 2:09 PM    [facility] at [place], [place], [place] [address] Phone: [phone]; Fax: [phone]

## 2019-07-13 NOTE — Patient Instructions (Addendum)
Medication Instructions:    Your physician recommends that you continue on your current medications as directed. Please refer to the Current Medication list given to you today.  Labwork:  NONE  Testing/Procedures:  NONE  Follow-Up:  Your physician recommends that you schedule a follow-up appointment in: 1 year (office). You will receive a reminder call in about 10 months reminding you to call and schedule your appointment. If you don't receive this call, please contact our office.  Any Other Special Instructions Will Be Listed Below (If Applicable).  If you need a refill on your cardiac medications before your next appointment, please call your pharmacy.

## 2019-07-18 DIAGNOSIS — Z136 Encounter for screening for cardiovascular disorders: Secondary | ICD-10-CM | POA: Diagnosis not present

## 2019-07-18 DIAGNOSIS — Z0189 Encounter for other specified special examinations: Secondary | ICD-10-CM | POA: Diagnosis not present

## 2019-07-18 DIAGNOSIS — M25561 Pain in right knee: Secondary | ICD-10-CM | POA: Diagnosis not present

## 2019-07-18 NOTE — Patient Instructions (Addendum)
DUE TO COVID-19 ONLY ONE VISITOR IS ALLOWED TO COME WITH YOU AND STAY IN THE WAITING ROOM ONLY DURING PRE OP AND PROCEDURE DAY OF SURGERY. THE 1 VISITOR MAY VISIT WITH YOU AFTER SURGERY IN YOUR PRIVATE ROOM DURING VISITING HOURS ONLY!  YOU NEED TO HAVE A COVID 19 TEST ON: 07/21/19 @  9:00 am , THIS TEST MUST BE DONE BEFORE SURGERY, COME  [address], [place] , [address].  ([place]) ONCE YOUR COVID TEST IS COMPLETED, PLEASE BEGIN THE QUARANTINE INSTRUCTIONS AS OUTLINED IN YOUR HANDOUT.                Marissa Turner    Your procedure is scheduled on: 07/25/19   Report to [facility] Main  Entrance   Report to admitting at: 10:10 AM     Call this number if you have problems the morning of surgery [phone]    Remember:   NO SOLID FOOD AFTER MIDNIGHT THE NIGHT PRIOR TO SURGERY. NOTHING BY MOUTH EXCEPT CLEAR LIQUIDS UNTIL: 9:40 am . PLEASE FINISH ENSURE DRINK PER SURGEON ORDER  WHICH NEEDS TO BE COMPLETED AT: 9:40 am .   CLEAR LIQUID DIET   Foods Allowed                                                                     Foods Excluded  Coffee and tea, regular and decaf                             liquids that you cannot  Plain Jell-O any favor except red or purple                                           see through such as: Fruit ices (not with fruit pulp)                                     milk, soups, orange juice  Iced Popsicles                                    All solid food Carbonated beverages, regular and diet                                    Cranberry, grape and apple juices Sports drinks like Gatorade Lightly seasoned clear broth or consume(fat free) Sugar, honey syrup  Sample Menu Breakfast                                Lunch                                     Supper Cranberry juice  Beef broth                            Chicken broth Jell-O                                     Grape juice                            Apple juice Coffee or tea                        Jell-O                                      Popsicle                                                Coffee or tea                        Coffee or tea  _____________________________________________________________________   BRUSH YOUR TEETH MORNING OF SURGERY AND RINSE YOUR MOUTH OUT, NO CHEWING GUM CANDY OR MINTS.     Take these medicines the morning of surgery with A SIP OF WATER: citalopram,diltiazem                                You may not have any metal on your body including hair pins and              piercings  Do not wear jewelry, make-up, lotions, powders or perfumes, deodorant             Do not wear nail polish on your fingernails.  Do not shave  48 hours prior to surgery.     Do not bring valuables to the hospital. [place].  Contacts, dentures or bridgework may not be worn into surgery.  Leave suitcase in the car. After surgery it may be brought to your room.     Patients discharged the day of surgery will not be allowed to drive home. IF YOU ARE HAVING SURGERY AND GOING HOME THE SAME DAY, YOU MUST HAVE AN ADULT TO DRIVE YOU HOME AND BE WITH YOU FOR 24 HOURS. YOU MAY GO HOME BY TAXI OR UBER OR ORTHERWISE, BUT AN ADULT MUST ACCOMPANY YOU HOME AND STAY WITH YOU FOR 24 HOURS.  Name and phone number of your driver:  Special Instructions: N/A              Please read over the following fact sheets you were given: _____________________________________________________________________  [facility] - Preparing for Surgery Before surgery, you can play an important role.  Because skin is not sterile, your skin needs to be as free of germs as possible.  You can reduce the number of germs on your skin by washing with CHG (chlorahexidine gluconate) soap before surgery.  CHG is an antiseptic cleaner which kills germs  and bonds with the skin to continue killing germs even after  washing. Please DO NOT use if you have an allergy to CHG or antibacterial soaps.  If your skin becomes reddened/irritated stop using the CHG and inform your nurse when you arrive at Short Stay. Do not shave (including legs and underarms) for at least 48 hours prior to the first CHG shower.  You may shave your face/neck. Please follow these instructions carefully:  1.  Shower with CHG Soap the night before surgery and the  morning of Surgery.  2.  If you choose to wash your hair, wash your hair first as usual with your  normal  shampoo.  3.  After you shampoo, rinse your hair and body thoroughly to remove the  shampoo.                           4.  Use CHG as you would any other liquid soap.  You can apply chg directly  to the skin and wash                       Gently with a scrungie or clean washcloth.  5.  Apply the CHG Soap to your body ONLY FROM THE NECK DOWN.   Do not use on face/ open                           Wound or open sores. Avoid contact with eyes, ears mouth and genitals (private parts).                       Wash face,  Genitals (private parts) with your normal soap.             6.  Wash thoroughly, paying special attention to the area where your surgery  will be performed.  7.  Thoroughly rinse your body with warm water from the neck down.  8.  DO NOT shower/wash with your normal soap after using and rinsing off  the CHG Soap.                9.  Pat yourself dry with a clean towel.            10.  Wear clean pajamas.            11.  Place clean sheets on your bed the night of your first shower and do not  sleep with pets. Day of Surgery : Do not apply any lotions/deodorants the morning of surgery.  Please wear clean clothes to the hospital/surgery center.  FAILURE TO FOLLOW THESE INSTRUCTIONS MAY RESULT IN THE CANCELLATION OF YOUR SURGERY PATIENT SIGNATURE_________________________________  NURSE  SIGNATURE__________________________________  ________________________________________________________________________   Marissa Turner  An incentive spirometer is a tool that can help keep your lungs clear and active. This tool measures how well you are filling your lungs with each breath. Taking long deep breaths may help reverse or decrease the chance of developing breathing (pulmonary) problems (especially infection) following:  A long period of time when you are unable to move or be active. BEFORE THE PROCEDURE   If the spirometer includes an indicator to show your best effort, your nurse or respiratory therapist will set it to a desired goal.  If possible, sit up straight or lean slightly forward. Try not to slouch.  Hold the incentive spirometer in an upright  position. INSTRUCTIONS FOR USE  1. Sit on the edge of your bed if possible, or sit up as far as you can in bed or on a chair. 2. Hold the incentive spirometer in an upright position. 3. Breathe out normally. 4. Place the mouthpiece in your mouth and seal your lips tightly around it. 5. Breathe in slowly and as deeply as possible, raising the piston or the ball toward the top of the column. 6. Hold your breath for 3-5 seconds or for as long as possible. Allow the piston or ball to fall to the bottom of the column. 7. Remove the mouthpiece from your mouth and breathe out normally. 8. Rest for a few seconds and repeat Steps 1 through 7 at least 10 times every 1-2 hours when you are awake. Take your time and take a few normal breaths between deep breaths. 9. The spirometer may include an indicator to show your best effort. Use the indicator as a goal to work toward during each repetition. 10. After each set of 10 deep breaths, practice coughing to be sure your lungs are clear. If you have an incision (the cut made at the time of surgery), support your incision when coughing by placing a pillow or rolled up towels firmly  against it. Once you are able to get out of bed, walk around indoors and cough well. You may stop using the incentive spirometer when instructed by your caregiver.  RISKS AND COMPLICATIONS  Take your time so you do not get dizzy or light-headed.  If you are in pain, you may need to take or ask for pain medication before doing incentive spirometry. It is harder to take a deep breath if you are having pain. AFTER USE  Rest and breathe slowly and easily.  It can be helpful to keep track of a log of your progress. Your caregiver can provide you with a simple table to help with this. If you are using the spirometer at home, follow these instructions: [place] IF:   You are having difficultly using the spirometer.  You have trouble using the spirometer as often as instructed.  Your pain medication is not giving enough relief while using the spirometer.  You develop fever of 100.5 F (38.1 C) or higher. SEEK IMMEDIATE MEDICAL CARE IF:   You cough up bloody sputum that had not been present before.  You develop fever of 102 F (38.9 C) or greater.  You develop worsening pain at or near the incision site. MAKE SURE YOU:   Understand these instructions.  Will watch your condition.  Will get help right away if you are not doing well or get worse. Document Released: 05/26/2006 Document Revised: 04/07/2011 Document Reviewed: 07/27/2006 [facility] Patient Information 2014 [place], [place].   ________________________________________________________________________

## 2019-07-19 ENCOUNTER — Encounter (HOSPITAL_COMMUNITY)
Admission: RE | Admit: 2019-07-19 | Discharge: 2019-07-19 | Disposition: A | Discharge status: Home / Self Care | Payer: Medicare Other | Source: Ambulatory Visit | Attending: Orthopedic Surgery | Admitting: Orthopedic Surgery

## 2019-07-19 ENCOUNTER — Encounter (HOSPITAL_COMMUNITY): Payer: Self-pay

## 2019-07-19 ENCOUNTER — Other Ambulatory Visit: Payer: Self-pay

## 2019-07-19 DIAGNOSIS — M1610 Unilateral primary osteoarthritis, unspecified hip: Secondary | ICD-10-CM | POA: Insufficient documentation

## 2019-07-19 DIAGNOSIS — J449 Chronic obstructive pulmonary disease, unspecified: Secondary | ICD-10-CM | POA: Insufficient documentation

## 2019-07-19 DIAGNOSIS — I4891 Unspecified atrial fibrillation: Secondary | ICD-10-CM | POA: Insufficient documentation

## 2019-07-19 DIAGNOSIS — Z7901 Long term (current) use of anticoagulants: Secondary | ICD-10-CM | POA: Insufficient documentation

## 2019-07-19 DIAGNOSIS — M47817 Spondylosis without myelopathy or radiculopathy, lumbosacral region: Secondary | ICD-10-CM | POA: Diagnosis not present

## 2019-07-19 DIAGNOSIS — Z87891 Personal history of nicotine dependence: Secondary | ICD-10-CM | POA: Insufficient documentation

## 2019-07-19 DIAGNOSIS — Z79899 Other long term (current) drug therapy: Secondary | ICD-10-CM | POA: Insufficient documentation

## 2019-07-19 DIAGNOSIS — Z01812 Encounter for preprocedural laboratory examination: Secondary | ICD-10-CM | POA: Diagnosis not present

## 2019-07-19 DIAGNOSIS — F329 Major depressive disorder, single episode, unspecified: Secondary | ICD-10-CM | POA: Diagnosis not present

## 2019-07-19 DIAGNOSIS — M1711 Unilateral primary osteoarthritis, right knee: Secondary | ICD-10-CM | POA: Insufficient documentation

## 2019-07-19 LAB — COMPREHENSIVE METABOLIC PANEL
ALT: 24 U/L (ref 0–44)
AST: 31 U/L (ref 15–41)
Albumin: 4.1 g/dL (ref 3.5–5.0)
Alkaline Phosphatase: 78 U/L (ref 38–126)
Anion gap: 11 (ref 5–15)
BUN: 10 mg/dL (ref 8–23)
CO2: 31 mmol/L (ref 22–32)
Calcium: 8.9 mg/dL (ref 8.9–10.3)
Chloride: 101 mmol/L (ref 98–111)
Creatinine, Ser: 0.64 mg/dL (ref 0.44–1.00)
GFR calc Af Amer: >60 mL/min (ref >60)
GFR calc non Af Amer: >60 mL/min (ref >60)
Glucose, Bld: 101 mg/dL — ABNORMAL HIGH (ref 70–99)
Potassium: 3.8 mmol/L (ref 3.5–5.1)
Sodium: 143 mmol/L (ref 135–145)
Total Bilirubin: 0.5 mg/dL (ref 0.3–1.2)
Total Protein: 7.2 g/dL (ref 6.5–8.1)

## 2019-07-19 LAB — APTT: aPTT: 34 seconds (ref 24–36)

## 2019-07-19 LAB — CBC
HCT: 43.8 % (ref 36.0–46.0)
Hemoglobin: 13.9 g/dL (ref 12.0–15.0)
MCH: 28.1 pg (ref 26.0–34.0)
MCHC: 31.7 g/dL (ref 30.0–36.0)
MCV: 88.7 fL (ref 80.0–100.0)
Platelets: 290 10*3/uL (ref 150–400)
RBC: 4.94 MIL/uL (ref 3.87–5.11)
WBC: 9.4 10*3/uL (ref 4.0–10.5)
nRBC: 0 % (ref 0.0–0.2)

## 2019-07-19 LAB — PROTIME-INR
INR: 1.2 (ref 0.8–1.2)
Prothrombin Time: 14.7 seconds (ref 11.4–15.2)

## 2019-07-19 LAB — SURGICAL PCR SCREEN
MRSA, PCR: NEGATIVE
Staphylococcus aureus: NEGATIVE

## 2019-07-19 NOTE — Progress Notes (Signed)
COVID Vaccine Completed:yes Date COVID Vaccine completed: COVID vaccine manufacturer: Pfizer    *[employer]   PCP - Dr. Allyn Kenner Cardiologist - Dr. Kate Sable. Clearance: Andrew Quinn:NP. :07/13/19. EPIC  Chest x-ray - 05/03/19. Epic. CT: 05/16/19. EKG - 07/13/19. EPIC Stress Test -  ECHO -  Cardiac Cath -   Sleep Study -  CPAP -   Fasting Blood Sugar -  Checks Blood Sugar _____ times a day  Blood Thinner Instructions: Eliquis will be stop 3 days before surgery as per cardiologist instructions. Aspirin Instructions: Last Dose:  Anesthesia review: Hx: COPD,Afib  Patient denies shortness of breath, fever, cough and chest pain at PAT appointment   Patient verbalized understanding of instructions that were given to them at the PAT appointment. Patient was also instructed that they will need to review over the PAT instructions again at home before surgery.

## 2019-07-20 NOTE — Anesthesia Preprocedure Evaluation (Addendum)
Anesthesia Evaluation    Reviewed: Allergy & Precautions, Patient's Chart, lab work & pertinent test results  History of Anesthesia Complications (+) PONV and history of anesthetic complications  Airway Mallampati: II  TM Distance: >3 FB Neck ROM: Full    Dental  (+) Edentulous Lower, Edentulous Upper   Pulmonary COPD, former smoker,  non small cell lung ca s/p VATS and LLL lobectomy   Pulmonary exam normal        Cardiovascular hypertension, Pt. on medications Normal cardiovascular exam+ dysrhythmias (on Eliquis) Atrial Fibrillation   QTc prolonged (518ms)   Neuro/Psych Anxiety Depression negative neurological ROS     GI/Hepatic Neg liver ROS, PUD,   Endo/Other  negative endocrine ROS  Renal/GU negative Renal ROS  negative genitourinary   Musculoskeletal  (+) Arthritis ,   Abdominal   Peds  Hematology negative hematology ROS (+)   Anesthesia Other Findings Day of surgery medications reviewed with patient.  Reproductive/Obstetrics negative OB ROS                           Anesthesia Physical Anesthesia Plan  ASA: III  Anesthesia Plan: Spinal   Post-op Pain Management:  Regional for Post-op pain   Induction:   PONV Risk Score and Plan: 4 or greater and Treatment may vary due to age or medical condition, Propofol infusion and Dexamethasone  Airway Management Planned: Natural Airway and Simple Face Mask  Additional Equipment: None  Intra-op Plan:   Post-operative Plan:   Informed Consent: I have reviewed the patients History and Physical, chart, labs and discussed the procedure including the risks, benefits and alternatives for the proposed anesthesia with the patient or authorized representative who has indicated his/her understanding and acceptance.       Plan Discussed with: CRNA  Anesthesia Plan Comments: (See PAT note 07/19/2019, Konrad Felix, PA-C)        Anesthesia Quick Evaluation

## 2019-07-20 NOTE — H&P (Signed)
TOTAL KNEE ADMISSION H&P  Patient is being admitted for right total knee arthroplasty.  Subjective:  Chief Complaint:right knee pain.  HPI: Marissa Turner, 79 y.o. female, has a history of pain and functional disability in the right knee due to arthritis and has failed non-surgical conservative treatments for greater than 12 weeks to includecorticosteriod injections and activity modification.  Onset of symptoms was gradual, starting 2 years ago with gradually worsening course since that time. The patient noted no past surgery on the right knee(s).  Patient currently rates pain in the right knee(s) at 8 out of 10 with activity. Patient has worsening of pain with activity and weight bearing and pain that interferes with activities of daily living.  Patient has evidence of joint space narrowing by imaging studies.  There is no active infection.  Patient Active Problem List   Diagnosis Date Noted  . OA (osteoarthritis) of hip 10/18/2018  . Squamous cell lung cancer, left ([place]) 04/20/2018  . Hamartoma of lung ([place]) 04/20/2018  . S/P Left Lower Lobectomy, Wedge Resection LUL 04/15/2018  . Positive colorectal cancer screening using Cologuard test 03/22/2018  . Depression 07/25/2015  . Knee pain, right 03/22/2015  . Iron deficiency 10/17/2013  . 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 Encounter for therapeutic drug monitoring 02/24/2013  . DJD (degenerative joint disease), lumbosacral   . Atrial fibrillation, chronic ([place])   . Chronic anticoagulation   . Tobacco abuse   . Peptic ulcer disease   . Hypertension   . SPINAL STENOSIS 02/03/2008   Past Medical History:  Diagnosis Date  . Atrial fibrillation, chronic ([place]) 2004   left atrial thrombus in 2004; near syncope in 2008; echocardiogram in 2008-normal EF, mild left atrial enlargement, no valvular abnormalities, lipomatous hypertrophy; no aortic atheroma on TEE in 2004  . Cancer ([facility])    LYMPH NODE    and lung cancer  .  Chronic anticoagulation 2004   warfarin  . COPD (chronic obstructive pulmonary disease) ([place])    denies  . Depression   . DJD (degenerative joint disease), lumbosacral    Also hip  . Dysrhythmia    chronic AFib  . Family history of adverse reaction to anesthesia    sister PONV  . Fracture of wrist    Left  . History of kidney stones    once  . Hypotension   . Panic attacks   . Peptic ulcer disease    s/p Billroth II  . PONV (postoperative nausea and vomiting)   . Tobacco abuse    40 pack years; quit attempt in 2012    Past Surgical History:  Procedure Laterality Date  . ABDOMINAL HYSTERECTOMY     w/o oophorectomy  partial  . APPENDECTOMY    . CESAREAN SECTION    . CHOLECYSTECTOMY  2004   [name]   patient denies ever having undergone colonoscopy as of 2014  . GASTROJEJUNOSTOMY  1984   Billroth II; splenectomy; incidental appendectomy  . KNEE ARTHROSCOPY WITH MEDIAL MENISECTOMY Right 02/26/2015   Procedure: KNEE ARTHROSCOPY WITH MEDIAL MENISECTOMY;  Surgeon: Sanjuana Kava, MD;  Location: [facility];  Service: Orthopedics;  Laterality: Right;  . LYMPH NODE BIOPSY Left 09/15/2013   Procedure: LEFT NECK CERVICAL NODE BIOPSY;  Surgeon: Scherry Ran, MD;  Location: [facility];  Service: General;  Laterality: Left;  . PORT-A-CATH REMOVAL Right 07/12/2014   Procedure: REMOVAL PORT-A-CATH;  Surgeon: Aviva Signs Md, MD;  Location: [facility];  Service: General;  Laterality: Right;  . PORTACATH PLACEMENT Right 10/21/2013   Procedure: ATTEMPTED INSERTION PORT-A-CATH;  Surgeon: Scherry Ran, MD;  Location: [facility];  Service: General;  Laterality: Right;  . SPLENECTOMY, TOTAL    . THORACOSCOPY Left 04/15/2018   VIDEO ASSISTED THORACOSCOPY (VATS)/LEFT LOWER LOBECTOMY (Left)  . 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  [name] TOTAL HIP ARTHROPLASTY Left 10/18/2018   Procedure: TOTAL HIP ARTHROPLASTY ANTERIOR APPROACH;  Surgeon: Gaynelle Arabian, MD;   Location: [facility];  Service: Orthopedics;  Laterality: Left;  184min  . VIDEO ASSISTED THORACOSCOPY (VATS)/ LOBECTOMY Left 04/15/2018   Procedure: VIDEO ASSISTED THORACOSCOPY (VATS)/LEFT LOWER LOBECTOMY;  Surgeon: Melrose Nakayama, MD;  Location: [facility];  Service: Thoracic;  Laterality: Left;  . WEDGE RESECTION Left 04/15/2018   Procedure: LEFT UPPER WEDGE RESECTION;  Surgeon: Melrose Nakayama, MD;  Location: [facility];  Service: Thoracic;  Laterality: Left;    No current facility-administered medications for this encounter.   Current Outpatient Medications  Medication Sig Dispense Refill Last Dose  . acetaminophen (TYLENOL) 500 MG tablet Take 1,000 mg by mouth every 6 (six) hours as needed for moderate pain or headache.      . ALPRAZolam (XANAX) 1 MG tablet Take 1 tablet (1 mg total) by mouth 3 (three) times daily as needed for anxiety. (Patient taking differently: Take 1 mg by mouth at bedtime. ) 30 tablet 0   . apixaban (ELIQUIS) 5 MG TABS tablet Take 1 tablet (5 mg total) by mouth 2 (two) times daily. 60 tablet 5   . Ascorbic Acid (VITAMIN C WITH ROSE HIPS) 500 MG tablet Take 500 mg by mouth daily.     . cholecalciferol (VITAMIN D3) 25 MCG (1000 UNIT) tablet Take 1,000 Units by mouth daily.     . citalopram (CELEXA) 20 MG tablet Take 20 mg by mouth daily.     [name] diltiazem (CARDIZEM CD) 240 MG 24 hr capsule Take 1 capsule (240 mg total) by mouth daily. 90 capsule 3    Allergies  Allergen Reactions  . Estrogens Swelling    edema  . Codeine Swelling and Rash  . Penicillins Other (See Comments)    Break out in welts. Did it involve swelling of the face/tongue/throat, SOB, or low BP? No Did it involve sudden or severe rash/hives, skin peeling, or any reaction on the inside of your mouth or nose? No Did you need to seek medical attention at a hospital or doctor's office? No When did it last happen? Childhood allergy If all above answers are "NO", may proceed with cephalosporin  use.   . Diphenhydramine Hcl Other (See Comments)    hallucinations     Social History   Tobacco Use  . Smoking status: Former Smoker    Packs/day: 0.50    Years: 55.00    Pack years: 27.50    Types: Cigarettes  . Smokeless tobacco: Never Used  . Tobacco comment: quit quit in june  Substance Use Topics  . Alcohol use: No    Family History  Problem Relation Age of Onset  . Heart attack Mother        also brother  . Breast cancer Sister   . Heart attack Brother   . Breast cancer Sister        x2  . Cancer Brother        Gastric carcinoma; also father  . Cirrhosis Sister      Review of Systems  Constitutional: Negative for chills and fever.  Respiratory: Negative for cough and shortness of breath.   Cardiovascular: Negative for chest pain.  Gastrointestinal: Negative for nausea and vomiting.  Musculoskeletal: Positive for arthralgias.    Objective:  Physical Exam Patient is a 79 year old female.  Well nourished and well developed. General: Alert and oriented x3, cooperative and pleasant, no acute distress. Head: normocephalic, atraumatic, neck supple. Eyes: EOMI. Respiratory: breath sounds clear in all fields, no wheezing, rales, or rhonchi. Cardiovascular: Regular rate and rhythm, no murmurs, gallops or rubs. Abdomen: non-tender to palpation and soft, normoactive bowel sounds.  Musculoskeletal: Right Knee Exam: No effusion present. The range of motion is: 5 to 130 degrees. Moderate crepitus on range of motion of the knee. Positive medial joint line tenderness. No lateral joint line tenderness. The knee is stable.  Calves soft and nontender. Motor function intact in LE. Strength 5/5 LE bilaterally. Neuro: Distal pulses 2+. Sensation to light touch intact in LE. Vital signs in last 24 hours:    Labs:   Estimated body mass index is 26.52 kg/m as calculated from the following:   Height as of 07/19/19: 5\' 2"  (1.575 m).   Weight as of 07/19/19: 65.8  kg.   Imaging Review Plain radiographs demonstrate severe degenerative joint disease of the right knee(s). The overall alignment isneutral. The bone quality appears to be adequate for age and reported activity level.  Assessment/Plan:  End stage arthritis, right knee   The patient history, physical examination, clinical judgment of the provider and imaging studies are consistent with end stage degenerative joint disease of the right knee(s) and total knee arthroplasty is deemed medically necessary. The treatment options including medical management, injection therapy arthroscopy and arthroplasty were discussed at length. The risks and benefits of total knee arthroplasty were presented and reviewed. The risks due to aseptic loosening, infection, stiffness, patella tracking problems, thromboembolic complications and other imponderables were discussed. The patient acknowledged the explanation, agreed to proceed with the plan and consent was signed. Patient is being admitted for inpatient treatment for surgery, pain control, PT, OT, prophylactic antibiotics, VTE prophylaxis, progressive ambulation and ADL's and discharge planning. The patient is planning to be discharged home.  Therapy Plans: outpatient therapy at [facility] in [place] Disposition: Home with husband Planned DVT Prophylaxis: Eliquis 5 mg daily (hx of a fib) DME needed: none PCP: Dr. Delphina Cahill, awaiting clearance Cardiologist: Dr. Bronson Ing, awaiting clearance TXA: IV Allergies: codeine - rash, benadryl - hallucinations, aspirin/NSAIDs - difficulty breathing Anesthesia Concerns: none BMI: 28.4 Not diabetic.   Other: Tolerates dilaudid. Hx of lung cancer, s/p partial left lower lobe resection last year.   Patient's anticipated LOS is less than 2 midnights, meeting these requirements: - Lives within 1 hour of care - Has a competent adult at home to recover with post-op recover - NO history of  - Chronic pain requiring  opiods  - Diabetes  - Coronary Artery Disease  - Heart failure  - Heart attack  - Stroke  - DVT/VTE  - Cardiac arrhythmia  - Respiratory Failure/COPD  - Renal failure  - Anemia  - Advanced Liver disease  - Patient was instructed on what medications to stop prior to surgery. - Follow-up visit in 2 weeks with Dr. Wynelle Link - Begin physical therapy following surgery - Pre-operative lab work as pre-surgical testing - Prescriptions will be provided in hospital at time of discharge  Griffith Citron, PA-C Orthopedic Surgery [facility] [phone]

## 2019-07-20 NOTE — Progress Notes (Signed)
Anesthesia Chart Review   Case: [id number] Date/Time: 07/25/19 1225   Procedure: TOTAL KNEE ARTHROPLASTY (Right Knee) - 50min   Anesthesia type: Choice   Pre-op diagnosis: right knee osteoarthritis   [facility] ROOM 10 / [facility]   Surgeons: Gaynelle Arabian, MD       DISCUSSION:78 y.o. 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 Gaynelle Arabian.   Pt last seen by cardiology for preoperative evaluation 07/13/2019.  Per OV note, "Pt is pending a right total knee arthroplasty Dr. Maureen Ralphs.  Revised cardiac risk index = 0 placing the patient at perioperative risk of major cardiac event at 0.4%.  From a cardiac standpoint patient is clear to undergo right TKA.  Must hold Eliquis 5 days prior to procedure and resume at surgeon's discretion after procedure."  Anticipate pt can proceed with planned procedure barring acute status change.   VS: BP 121/79    Pulse 76    Temp 36.5 C    Resp 20    Ht 5\' 2"  (1.575 m)    Wt 65.8 kg    SpO2 92%    BMI 26.52 kg/m   PROVIDERS: Celene Squibb, MD is PCP   Kate Sable, MD is Cardiologist  LABS: Labs reviewed: Acceptable for surgery. (all labs ordered are listed, but only abnormal results are displayed)  Labs Reviewed - No data to display   IMAGES:   EKG: 07/13/2019 Rate 74 bpm Atrial fibrillation  ST & T wave abnormality, consider anterior ischemia or digitalis effect Prolonged QT  CV: Myocardial Perfusion 01/12/2018  There was no ST segment deviation noted during stress.  This is a low risk study.  The left ventricular ejection fraction is normal (55-65%).  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 This area may be affected  by artifact due to adjacent gut radiotracer uptake. Overall findings are low risk  Echo 06/10/2002 SUMMARY  - Left ventricular ejection fraction was estimated , range being 55     % to 65 %. There were no left ventricular regional wall     motion abnormalities. Left ventricular wall thickness was     mildly increased.  - There was no atheroma of the aortic arch. There was mild atheroma     of the descending aorta.  - There was mild mitral annular calcification. There was mild     mitral valvular regurgitation.  - The left atrium was moderately dilated. Findings were suggestive     of a small, mobile thrombus in the left atrial appendage     measuring 4x5 mm.  - The pulmonary veins were grossly normal.   Past Medical History:  Diagnosis Date   Atrial fibrillation, chronic ([place]) 2004   left atrial thrombus in 2004; near syncope in 2008; echocardiogram in 2008-normal EF, mild left atrial enlargement, no valvular abnormalities, lipomatous hypertrophy; no aortic atheroma on TEE in 2004   Cancer ([place])    LYMPH NODE    and lung cancer   Chronic anticoagulation 2004   warfarin   COPD (chronic obstructive pulmonary disease) ([place])    denies   Depression    DJD (degenerative joint disease), lumbosacral    Also hip   Dysrhythmia    chronic  AFib   Family history of adverse reaction to anesthesia    sister PONV   Fracture of wrist    Left   History of kidney stones    once   Hypotension    Panic attacks    Peptic ulcer disease    s/p Billroth II   PONV (postoperative nausea and vomiting)    Tobacco abuse    40 pack years; quit attempt in 2012    Past Surgical History:  Procedure Laterality Date   ABDOMINAL HYSTERECTOMY     w/o oophorectomy  partial   [place]  2004   [place]   patient denies ever having undergone colonoscopy as of 2014    [place] II; splenectomy; incidental appendectomy   KNEE ARTHROSCOPY WITH MEDIAL MENISECTOMY Right 02/26/2015   Procedure: KNEE ARTHROSCOPY WITH MEDIAL MENISECTOMY;  Surgeon: Sanjuana Kava, MD;  [facility];  Service: Orthopedics;  Laterality: Right;   LYMPH NODE BIOPSY Left 09/15/2013   Procedure: LEFT NECK CERVICAL NODE BIOPSY;  Surgeon: Scherry Ran, MD;  [facility]: [facility];  Service: General;  Laterality: Left;   PORT-A-CATH REMOVAL Right 07/12/2014   Procedure: REMOVAL PORT-A-CATH;  Surgeon: Aviva Signs Md, MD;  Location: [facility];  Service: General;  Laterality: Right;   PORTACATH PLACEMENT Right 10/21/2013   Procedure: ATTEMPTED INSERTION PORT-A-CATH;  Surgeon: Scherry Ran, MD;  [facility]: [facility];  Service: General;  Laterality: Right;   SPLENECTOMY, TOTAL     THORACOSCOPY Left 04/15/2018   VIDEO ASSISTED THORACOSCOPY (VATS)/LEFT LOWER LOBECTOMY (Left)   TONSILLECTOMY     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   TOTAL HIP ARTHROPLASTY Left 10/18/2018   Procedure: TOTAL HIP ARTHROPLASTY ANTERIOR APPROACH;  Surgeon: Gaynelle Arabian, MD;  [facility];  Service: Orthopedics;  Laterality: Left;  166min   VIDEO ASSISTED THORACOSCOPY (VATS)/ LOBECTOMY Left 04/15/2018   Procedure: VIDEO ASSISTED THORACOSCOPY (VATS)/LEFT LOWER LOBECTOMY;  Surgeon: Melrose Nakayama, MD;  [facility];  Service: Thoracic;  Laterality: Left;   WEDGE RESECTION Left 04/15/2018   Procedure: LEFT UPPER WEDGE RESECTION;  Surgeon: Melrose Nakayama, MD;  [facility];  Service: Thoracic;  Laterality: Left;    MEDICATIONS:  acetaminophen (TYLENOL) 500 MG tablet   ALPRAZolam (XANAX) 1 MG tablet   apixaban (ELIQUIS) 5 MG TABS tablet   Ascorbic Acid (VITAMIN C WITH ROSE HIPS) 500 MG tablet   cholecalciferol (VITAMIN D3) 25 MCG (1000 UNIT) tablet   citalopram (CELEXA) 20 MG tablet   diltiazem (CARDIZEM CD) 240 MG 24 hr capsule   No current  facility-administered medications for this encounter.   Maia Plan [facility] Pre-Surgical Testing [phone] 07/20/19  12:39 PM

## 2019-07-21 ENCOUNTER — Other Ambulatory Visit (HOSPITAL_COMMUNITY)
Admission: RE | Admit: 2019-07-21 | Discharge: 2019-07-21 | Disposition: A | Discharge status: Home / Self Care | Payer: Medicare Other | Source: Ambulatory Visit | Attending: Orthopedic Surgery | Admitting: Orthopedic Surgery

## 2019-07-21 DIAGNOSIS — Z20822 Contact with and (suspected) exposure to covid-19: Secondary | ICD-10-CM | POA: Diagnosis not present

## 2019-07-21 DIAGNOSIS — Z01812 Encounter for preprocedural laboratory examination: Secondary | ICD-10-CM | POA: Diagnosis not present

## 2019-07-21 LAB — SARS CORONAVIRUS 2 (TAT 6-24 HRS): SARS Coronavirus 2: NEGATIVE

## 2019-07-24 MED ORDER — BUPIVACAINE LIPOSOME 1.3 % IJ SUSP
20.0000 mL | INTRAMUSCULAR | Status: AC
  Filled 2019-07-24: qty 20

## 2019-07-25 ENCOUNTER — Encounter (HOSPITAL_COMMUNITY)
Admission: RE | Disposition: A | Discharge status: Home / Self Care | Payer: Self-pay | Source: Home / Self Care | Attending: Orthopedic Surgery

## 2019-07-25 ENCOUNTER — Encounter (HOSPITAL_COMMUNITY): Payer: Self-pay | Admitting: Orthopedic Surgery

## 2019-07-25 ENCOUNTER — Observation Stay (HOSPITAL_COMMUNITY)
Admission: RE | Admit: 2019-07-25 | Discharge: 2019-07-26 | Disposition: A | Discharge status: Home / Self Care | Payer: Medicare Other | Attending: Orthopedic Surgery | Admitting: Orthopedic Surgery

## 2019-07-25 ENCOUNTER — Other Ambulatory Visit: Payer: Self-pay

## 2019-07-25 ENCOUNTER — Ambulatory Visit (HOSPITAL_COMMUNITY): Payer: Medicare Other | Admitting: Physician Assistant

## 2019-07-25 ENCOUNTER — Ambulatory Visit (HOSPITAL_COMMUNITY): Payer: Medicare Other | Admitting: Certified Registered Nurse Anesthetist

## 2019-07-25 DIAGNOSIS — J449 Chronic obstructive pulmonary disease, unspecified: Secondary | ICD-10-CM | POA: Insufficient documentation

## 2019-07-25 DIAGNOSIS — F410 Panic disorder [episodic paroxysmal anxiety] without agoraphobia: Secondary | ICD-10-CM | POA: Diagnosis not present

## 2019-07-25 DIAGNOSIS — M1711 Unilateral primary osteoarthritis, right knee: Principal | ICD-10-CM | POA: Diagnosis present

## 2019-07-25 DIAGNOSIS — Z902 Acquired absence of lung [part of]: Secondary | ICD-10-CM | POA: Diagnosis not present

## 2019-07-25 DIAGNOSIS — M179 Osteoarthritis of knee, unspecified: Secondary | ICD-10-CM | POA: Diagnosis present

## 2019-07-25 DIAGNOSIS — I482 Chronic atrial fibrillation, unspecified: Secondary | ICD-10-CM | POA: Insufficient documentation

## 2019-07-25 DIAGNOSIS — Z96642 Presence of left artificial hip joint: Secondary | ICD-10-CM | POA: Diagnosis not present

## 2019-07-25 DIAGNOSIS — I10 Essential (primary) hypertension: Secondary | ICD-10-CM | POA: Insufficient documentation

## 2019-07-25 DIAGNOSIS — Z87891 Personal history of nicotine dependence: Secondary | ICD-10-CM | POA: Diagnosis not present

## 2019-07-25 DIAGNOSIS — Z79899 Other long term (current) drug therapy: Secondary | ICD-10-CM | POA: Diagnosis not present

## 2019-07-25 DIAGNOSIS — Z7901 Long term (current) use of anticoagulants: Secondary | ICD-10-CM | POA: Insufficient documentation

## 2019-07-25 DIAGNOSIS — Z85118 Personal history of other malignant neoplasm of bronchus and lung: Secondary | ICD-10-CM | POA: Diagnosis not present

## 2019-07-25 DIAGNOSIS — F329 Major depressive disorder, single episode, unspecified: Secondary | ICD-10-CM | POA: Insufficient documentation

## 2019-07-25 DIAGNOSIS — G8918 Other acute postprocedural pain: Secondary | ICD-10-CM | POA: Diagnosis not present

## 2019-07-25 HISTORY — PX: TOTAL KNEE ARTHROPLASTY: SHX125

## 2019-07-25 LAB — TYPE AND SCREEN
ABO/RH(D): A POS
Antibody Screen: NEGATIVE

## 2019-07-25 SURGERY — ARTHROPLASTY, KNEE, TOTAL
Anesthesia: Spinal | Site: Knee | Laterality: Right

## 2019-07-25 MED ORDER — ALPRAZOLAM 1 MG PO TABS: 1.0000 mg | ORAL_TABLET | Freq: Every day | ORAL | Status: DC

## 2019-07-25 MED ORDER — SODIUM CHLORIDE (PF) 0.9 % IJ SOLN
INTRAMUSCULAR | Status: DC | PRN
  Administered 2019-07-25: 60 mL

## 2019-07-25 MED ORDER — METHOCARBAMOL 500 MG PO TABS
500.0000 mg | ORAL_TABLET | Freq: Four times a day (QID) | ORAL | Status: DC | PRN
  Administered 2019-07-26: 500 mg via ORAL
  Filled 2019-07-25: qty 1

## 2019-07-25 MED ORDER — DEXAMETHASONE SODIUM PHOSPHATE 10 MG/ML IJ SOLN
INTRAMUSCULAR | Status: DC | PRN
  Administered 2019-07-25: 4 mg via INTRAVENOUS

## 2019-07-25 MED ORDER — CEFAZOLIN SODIUM-DEXTROSE 2-4 GM/100ML-% IV SOLN
2.0000 g | Freq: Four times a day (QID) | INTRAVENOUS | Status: AC
  Administered 2019-07-25 – 2019-07-26 (×2): 2 g via INTRAVENOUS
  Filled 2019-07-25 (×2): qty 100

## 2019-07-25 MED ORDER — APIXABAN 2.5 MG PO TABS
2.5000 mg | ORAL_TABLET | Freq: Two times a day (BID) | ORAL | Status: DC
  Administered 2019-07-26: 2.5 mg via ORAL
  Filled 2019-07-25: qty 1

## 2019-07-25 MED ORDER — EPHEDRINE SULFATE-NACL 50-0.9 MG/10ML-% IV SOSY
PREFILLED_SYRINGE | INTRAVENOUS | Status: DC | PRN
  Administered 2019-07-25: 10 mg via INTRAVENOUS

## 2019-07-25 MED ORDER — SODIUM CHLORIDE (PF) 0.9 % IJ SOLN
INTRAMUSCULAR | Status: AC
  Filled 2019-07-25: qty 50

## 2019-07-25 MED ORDER — SODIUM CHLORIDE 0.9 % IV SOLN: INTRAVENOUS | Status: DC

## 2019-07-25 MED ORDER — MENTHOL 3 MG MT LOZG: 1.0000 | LOZENGE | OROMUCOSAL | Status: DC | PRN

## 2019-07-25 MED ORDER — HYDROMORPHONE HCL 2 MG PO TABS: 2.0000 mg | ORAL_TABLET | ORAL | Status: DC | PRN

## 2019-07-25 MED ORDER — FLEET ENEMA 7-19 GM/118ML RE ENEM: 1.0000 | ENEMA | Freq: Once | RECTAL | Status: DC | PRN

## 2019-07-25 MED ORDER — LIDOCAINE HCL (CARDIAC) PF 100 MG/5ML IV SOSY
PREFILLED_SYRINGE | INTRAVENOUS | Status: DC | PRN
  Administered 2019-07-25: 80 mg via INTRAVENOUS

## 2019-07-25 MED ORDER — ACETAMINOPHEN 10 MG/ML IV SOLN
1000.0000 mg | Freq: Four times a day (QID) | INTRAVENOUS | Status: DC
  Administered 2019-07-25: 1000 mg via INTRAVENOUS
  Filled 2019-07-25: qty 100

## 2019-07-25 MED ORDER — DROPERIDOL 2.5 MG/ML IJ SOLN: 0.6250 mg | Freq: Once | INTRAMUSCULAR | Status: AC | PRN

## 2019-07-25 MED ORDER — TRANEXAMIC ACID-NACL 1000-0.7 MG/100ML-% IV SOLN
1000.0000 mg | INTRAVENOUS | Status: AC
  Administered 2019-07-25: 1000 mg via INTRAVENOUS
  Filled 2019-07-25: qty 100

## 2019-07-25 MED ORDER — 0.9 % SODIUM CHLORIDE (POUR BTL) OPTIME
TOPICAL | Status: DC | PRN
  Administered 2019-07-25: 1000 mL

## 2019-07-25 MED ORDER — POLYETHYLENE GLYCOL 3350 17 G PO PACK: 17.0000 g | PACK | Freq: Every day | ORAL | Status: DC | PRN

## 2019-07-25 MED ORDER — METHOCARBAMOL 500 MG IVPB - SIMPLE MED
500.0000 mg | Freq: Four times a day (QID) | INTRAVENOUS | Status: DC | PRN
  Administered 2019-07-25: 500 mg via INTRAVENOUS
  Filled 2019-07-25: qty 50

## 2019-07-25 MED ORDER — METHOCARBAMOL 500 MG IVPB - SIMPLE MED
INTRAVENOUS | Status: AC
  Filled 2019-07-25: qty 50

## 2019-07-25 MED ORDER — HYDROMORPHONE HCL 1 MG/ML IJ SOLN
INTRAMUSCULAR | Status: AC
  Administered 2019-07-25: 0.5 mg
  Filled 2019-07-25: qty 1

## 2019-07-25 MED ORDER — LACTATED RINGERS IV SOLN: INTRAVENOUS | Status: DC | PRN

## 2019-07-25 MED ORDER — DROPERIDOL 2.5 MG/ML IJ SOLN
INTRAMUSCULAR | Status: AC
  Administered 2019-07-25: 0.625 mg via INTRAVENOUS
  Filled 2019-07-25: qty 2

## 2019-07-25 MED ORDER — BUPIVACAINE IN DEXTROSE 0.75-8.25 % IT SOLN
INTRATHECAL | Status: DC | PRN
  Administered 2019-07-25: 1.6 mL via INTRATHECAL

## 2019-07-25 MED ORDER — SODIUM CHLORIDE (PF) 0.9 % IJ SOLN
INTRAMUSCULAR | Status: AC
  Filled 2019-07-25: qty 10

## 2019-07-25 MED ORDER — FENTANYL CITRATE (PF) 100 MCG/2ML IJ SOLN: 25.0000 ug | INTRAMUSCULAR | Status: DC | PRN

## 2019-07-25 MED ORDER — ONDANSETRON HCL 4 MG/2ML IJ SOLN: 4.0000 mg | Freq: Four times a day (QID) | INTRAMUSCULAR | Status: DC | PRN

## 2019-07-25 MED ORDER — CEFAZOLIN SODIUM-DEXTROSE 2-4 GM/100ML-% IV SOLN
2.0000 g | INTRAVENOUS | Status: AC
  Administered 2019-07-25: 2 g via INTRAVENOUS
  Filled 2019-07-25: qty 100

## 2019-07-25 MED ORDER — MORPHINE SULFATE (PF) 4 MG/ML IV SOLN
0.5000 mg | INTRAVENOUS | Status: DC | PRN
  Administered 2019-07-25: 1 mg via INTRAVENOUS

## 2019-07-25 MED ORDER — DOCUSATE SODIUM 100 MG PO CAPS
100.0000 mg | ORAL_CAPSULE | Freq: Two times a day (BID) | ORAL | Status: DC
  Administered 2019-07-25 – 2019-07-26 (×2): 100 mg via ORAL
  Filled 2019-07-25 (×2): qty 1

## 2019-07-25 MED ORDER — ONDANSETRON HCL 4 MG PO TABS: 4.0000 mg | ORAL_TABLET | Freq: Four times a day (QID) | ORAL | Status: DC | PRN

## 2019-07-25 MED ORDER — ACETAMINOPHEN 500 MG PO TABS
1000.0000 mg | ORAL_TABLET | Freq: Four times a day (QID) | ORAL | Status: DC
  Administered 2019-07-26 (×2): 1000 mg via ORAL
  Filled 2019-07-25 (×3): qty 2

## 2019-07-25 MED ORDER — METOCLOPRAMIDE HCL 5 MG/ML IJ SOLN: 5.0000 mg | Freq: Three times a day (TID) | INTRAMUSCULAR | Status: DC | PRN

## 2019-07-25 MED ORDER — MORPHINE SULFATE (PF) 4 MG/ML IV SOLN
INTRAVENOUS | Status: AC
  Filled 2019-07-25: qty 1

## 2019-07-25 MED ORDER — LACTATED RINGERS IV SOLN: INTRAVENOUS | Status: DC

## 2019-07-25 MED ORDER — SODIUM CHLORIDE 0.9 % IR SOLN
Status: DC | PRN
  Administered 2019-07-25: 1000 mL

## 2019-07-25 MED ORDER — PROPOFOL 10 MG/ML IV BOLUS
INTRAVENOUS | Status: DC | PRN
  Administered 2019-07-25: 75 ug/kg/min via INTRAVENOUS

## 2019-07-25 MED ORDER — METOCLOPRAMIDE HCL 5 MG PO TABS: 5.0000 mg | ORAL_TABLET | Freq: Three times a day (TID) | ORAL | Status: DC | PRN

## 2019-07-25 MED ORDER — DEXAMETHASONE SODIUM PHOSPHATE 10 MG/ML IJ SOLN
10.0000 mg | Freq: Once | INTRAMUSCULAR | Status: AC
  Administered 2019-07-26: 10 mg via INTRAVENOUS
  Filled 2019-07-25: qty 1

## 2019-07-25 MED ORDER — CITALOPRAM HYDROBROMIDE 20 MG PO TABS
20.0000 mg | ORAL_TABLET | Freq: Every day | ORAL | Status: DC
  Administered 2019-07-26: 20 mg via ORAL
  Filled 2019-07-25: qty 1

## 2019-07-25 MED ORDER — ORAL CARE MOUTH RINSE: 15.0000 mL | Freq: Once | OROMUCOSAL | Status: AC

## 2019-07-25 MED ORDER — STERILE WATER FOR IRRIGATION IR SOLN
Status: DC | PRN
  Administered 2019-07-25: 2000 mL

## 2019-07-25 MED ORDER — POVIDONE-IODINE 10 % EX SWAB
2.0000 "application " | Freq: Once | CUTANEOUS | Status: AC
  Administered 2019-07-25: 2 via TOPICAL

## 2019-07-25 MED ORDER — BISACODYL 10 MG RE SUPP: 10.0000 mg | Freq: Every day | RECTAL | Status: DC | PRN

## 2019-07-25 MED ORDER — FENTANYL CITRATE (PF) 100 MCG/2ML IJ SOLN
50.0000 ug | INTRAMUSCULAR | Status: DC
  Administered 2019-07-25: 50 ug via INTRAVENOUS
  Filled 2019-07-25: qty 2

## 2019-07-25 MED ORDER — BUPIVACAINE-EPINEPHRINE (PF) 0.5% -1:200000 IJ SOLN
INTRAMUSCULAR | Status: DC | PRN
Start: 2019-07-25 — End: 2019-07-25
  Administered 2019-07-25: 15 mL via PERINEURAL

## 2019-07-25 MED ORDER — PROPOFOL 500 MG/50ML IV EMUL
INTRAVENOUS | Status: DC | PRN
  Administered 2019-07-25: 20 mg via INTRAVENOUS

## 2019-07-25 MED ORDER — PHENOL 1.4 % MT LIQD: 1.0000 | OROMUCOSAL | Status: DC | PRN

## 2019-07-25 MED ORDER — CHLORHEXIDINE GLUCONATE 0.12 % MT SOLN
15.0000 mL | Freq: Once | OROMUCOSAL | Status: AC
  Administered 2019-07-25: 15 mL via OROMUCOSAL

## 2019-07-25 MED ORDER — DILTIAZEM HCL ER COATED BEADS 240 MG PO CP24
240.0000 mg | ORAL_CAPSULE | Freq: Every day | ORAL | Status: DC
  Administered 2019-07-26: 240 mg via ORAL
  Filled 2019-07-25: qty 1

## 2019-07-25 MED ORDER — FENTANYL CITRATE (PF) 100 MCG/2ML IJ SOLN
INTRAMUSCULAR | Status: AC
  Filled 2019-07-25: qty 2

## 2019-07-25 MED ORDER — PHENYLEPHRINE HCL-NACL 10-0.9 MG/250ML-% IV SOLN
INTRAVENOUS | Status: DC | PRN
Start: 2019-07-25 — End: 2019-07-25
  Administered 2019-07-25: 25 ug/min via INTRAVENOUS

## 2019-07-25 MED ORDER — DEXAMETHASONE SODIUM PHOSPHATE 10 MG/ML IJ SOLN: 8.0000 mg | Freq: Once | INTRAMUSCULAR | Status: AC

## 2019-07-25 MED ORDER — BUPIVACAINE LIPOSOME 1.3 % IJ SUSP
INTRAMUSCULAR | Status: DC | PRN
  Administered 2019-07-25: 20 mL

## 2019-07-25 SURGICAL SUPPLY — 53 items
AUGMENT PFC SIG RP SZ2.5 12.5M (Knees) ×1 IMPLANT
BAG ZIPLOCK 12X15 (MISCELLANEOUS) ×3 IMPLANT
BLADE SAG 18X100X1.27 (BLADE) ×3 IMPLANT
BLADE SAW SGTL 11.0X1.19X90.0M (BLADE) ×3 IMPLANT
BLADE SURG SZ10 CARB STEEL (BLADE) ×6 IMPLANT
BNDG ELASTIC 6X5.8 VLCR STR LF (GAUZE/BANDAGES/DRESSINGS) ×3 IMPLANT
BOWL SMART MIX CTS (DISPOSABLE) ×3 IMPLANT
CEMENT HV SMART SET (Cement) ×6 IMPLANT
CEMENT TIBIA MBT SIZE 2 (Knees) ×1 IMPLANT
CLOSURE WOUND 1/2 X4 (GAUZE/BANDAGES/DRESSINGS) ×2
COVER SURGICAL LIGHT HANDLE (MISCELLANEOUS) ×3 IMPLANT
COVER WAND RF STERILE (DRAPES) IMPLANT
CUFF TOURN SGL QUICK 34 (TOURNIQUET CUFF) ×3
CUFF TRNQT CYL 34X4.125X (TOURNIQUET CUFF) ×1 IMPLANT
DECANTER SPIKE VIAL GLASS SM (MISCELLANEOUS) ×3 IMPLANT
DRAPE U-SHAPE 47X51 STRL (DRAPES) ×3 IMPLANT
DRSG AQUACEL AG ADV 3.5X10 (GAUZE/BANDAGES/DRESSINGS) ×3 IMPLANT
DURAPREP 26ML APPLICATOR (WOUND CARE) ×3 IMPLANT
ELECT REM PT RETURN 15FT ADLT (MISCELLANEOUS) ×3 IMPLANT
FEMUR SIGMA PS SZ 2.5 R (Femur) ×3 IMPLANT
GLOVE BIO SURGEON STRL SZ7 (GLOVE) ×3 IMPLANT
GLOVE BIO SURGEON STRL SZ8 (GLOVE) ×3 IMPLANT
GLOVE BIOGEL PI IND STRL 7.0 (GLOVE) ×1 IMPLANT
GLOVE BIOGEL PI IND STRL 8 (GLOVE) ×1 IMPLANT
GLOVE BIOGEL PI INDICATOR 7.0 (GLOVE) ×2
GLOVE BIOGEL PI INDICATOR 8 (GLOVE) ×2
GOWN STRL REUS W/TWL LRG LVL3 (GOWN DISPOSABLE) ×6 IMPLANT
HANDPIECE INTERPULSE COAX TIP (DISPOSABLE) ×3
HOLDER FOLEY CATH W/STRAP (MISCELLANEOUS) IMPLANT
IMMOBILIZER KNEE 20 (SOFTGOODS) ×3
IMMOBILIZER KNEE 20 THIGH 36 (SOFTGOODS) ×1 IMPLANT
KIT TURNOVER KIT A (KITS) IMPLANT
MANIFOLD NEPTUNE II (INSTRUMENTS) ×3 IMPLANT
NS IRRIG 1000ML POUR BTL (IV SOLUTION) ×3 IMPLANT
PACK TOTAL KNEE CUSTOM (KITS) ×3 IMPLANT
PADDING CAST COTTON 6X4 STRL (CAST SUPPLIES) ×3 IMPLANT
PATELLA DOME PFC 32MM (Knees) ×3 IMPLANT
PENCIL SMOKE EVACUATOR (MISCELLANEOUS) IMPLANT
PFC SIGMA RP STB SZ 2.5 12.5M (Knees) ×3 IMPLANT
PIN STEINMAN FIXATION KNEE (PIN) ×3 IMPLANT
PROTECTOR NERVE ULNAR (MISCELLANEOUS) ×3 IMPLANT
SET HNDPC FAN SPRY TIP SCT (DISPOSABLE) ×1 IMPLANT
STRIP CLOSURE SKIN 1/2X4 (GAUZE/BANDAGES/DRESSINGS) ×4 IMPLANT
SUT MNCRL AB 4-0 PS2 18 (SUTURE) ×3 IMPLANT
SUT STRATAFIX 0 PDS 27 VIOLET (SUTURE) ×3
SUT VIC AB 2-0 CT1 27 (SUTURE) ×9
SUT VIC AB 2-0 CT1 TAPERPNT 27 (SUTURE) ×3 IMPLANT
SUTURE STRATFX 0 PDS 27 VIOLET (SUTURE) ×1 IMPLANT
TIBIA MBT CEMENT SIZE 2 (Knees) ×3 IMPLANT
TRAY FOLEY MTR SLVR 16FR STAT (SET/KITS/TRAYS/PACK) ×3 IMPLANT
WATER STERILE IRR 1000ML POUR (IV SOLUTION) ×6 IMPLANT
WRAP KNEE MAXI GEL POST OP (GAUZE/BANDAGES/DRESSINGS) ×3 IMPLANT
YANKAUER SUCT BULB TIP 10FT TU (MISCELLANEOUS) ×3 IMPLANT

## 2019-07-25 NOTE — Interval H&P Note (Signed)
History and Physical Interval Note:  07/25/2019 10:53 AM  Marissa Turner  has presented today for surgery, with the diagnosis of right knee osteoarthritis.  The various methods of treatment have been discussed with the patient and family. After consideration of risks, benefits and other options for treatment, the patient has consented to  Procedure(s) with comments: TOTAL KNEE ARTHROPLASTY (Right) - 6min as a surgical intervention.  The patient's history has been reviewed, patient examined, no change in status, stable for surgery.  I have reviewed the patient's chart and labs.  Questions were answered to the patient's satisfaction.     Pilar Plate Mick [place]

## 2019-07-25 NOTE — Progress Notes (Signed)
Orthopedic Tech Progress Note Patient Details:  Marissa Turner 1940/08/15 [id number]  CPM Right Knee CPM Right Knee: Off Right Knee Flexion (Degrees): 40 Right Knee Extension (Degrees): 10 Additional Comments: Trapeze bar  Post Interventions Patient Tolerated: Well Instructions Provided: Care of device  [name] 07/25/2019, 5:29 PM

## 2019-07-25 NOTE — Progress Notes (Signed)
Orthopedic Tech Progress Note Patient Details:  Marissa Turner 08/11/1940 [id number]  CPM Right Knee CPM Right Knee: On Right Knee Flexion (Degrees): 40 Right Knee Extension (Degrees): 10 Additional Comments: Trapeze bar  Post Interventions Patient Tolerated: Well Instructions Provided: Care of device  [name] 07/25/2019, 2:36 PM

## 2019-07-25 NOTE — Transfer of Care (Signed)
Immediate Anesthesia Transfer of Care Note  Patient: Marissa Turner  Procedure(s) Performed: TOTAL KNEE ARTHROPLASTY (Right Knee)  Patient Location: PACU  Anesthesia Type:Spinal  Level of Consciousness: drowsy and responds to stimulation  Airway & Oxygen Therapy: Patient Spontanous Breathing and Patient connected to face mask oxygen  Post-op Assessment: Report given to RN and Post -op Vital signs reviewed and stable  Post vital signs: Reviewed and stable  Last Vitals:  Vitals Value Taken Time  BP 112/73 07/25/19 1417  Temp    Pulse 66 07/25/19 1419  Resp 18 07/25/19 1419  SpO2 100 % 07/25/19 1419  Vitals shown include unvalidated device data.  Last Pain:  Vitals:   07/25/19 1233  TempSrc:   PainSc: 0-No pain      Patients Stated Pain Goal: 4 (76/70/11 0034)  Complications: No complications documented.

## 2019-07-25 NOTE — Evaluation (Signed)
Physical Therapy Evaluation Patient Details Name: Marissa Turner MRN: [id number] DOB: 10/29/1940 Today's Date: 07/25/2019   History of Present Illness  Patient is 79 y.o. female s/p Rt TKA on 07/25/19 with PMH significant for Hypotension, COPD, lung cancer, A-fib, panic attacks, depression, Lt THA on 10/18/18.    Clinical Impression  Marissa Turner is a 79 y.o. female POD 0 s/p Rt TKA. Patient reports independence with [facility] for mobility at baseline. Patient is now limited by functional impairments (see PT problem list below) and requires min assist for bed mobility and transfers with RW. Patient was limited to sit<>stand transfer this date hip weakness, buckling at Rt knee in standing, and lethargy. Patient returned to supine and instructed in exercise to facilitate ROM and circulation. Patient will benefit from continued skilled PT interventions to address impairments and progress towards PLOF. Acute PT will follow to progress mobility and stair training in preparation for safe discharge home.     Follow Up Recommendations Follow surgeon's recommendation for DC plan and follow-up therapies    Equipment Recommendations  None recommended by PT    Recommendations for Other Services       Precautions / Restrictions Precautions Precautions: Fall Restrictions Weight Bearing Restrictions: No      Mobility  Bed Mobility Overal bed mobility: Needs Assistance Bed Mobility: Supine to Sit;Sit to Supine     Supine to sit: Min assist;HOB elevated Sit to supine: Min assist   General bed mobility comments: cues for sequencing supine<>sit with use of bed rail. assist required to bring Rt LE off EOB. Pt able to raise it with light assist to return to supine.  Transfers Overall transfer level: Needs assistance Equipment used: Rolling walker (2 wheeled) Transfers: Sit to/from Stand Sit to Stand: Mod assist;From elevated surface         General transfer comment: pt attempted power up from  low surface and unable to complete. bed height elevated and pt requried mod assist to rise, pt with anterior lean in standing and mod assist required to prevent LOB. pt attempted small side steps at EOB and Rt knee buckling slightly requiring manual facilitation of extension. Pt remained drowsy in standing and returned to sit EOB for safety.  Ambulation/Gait                Stairs            Wheelchair Mobility    Modified Rankin (Stroke Patients Only)       Balance Overall balance assessment: Needs assistance Sitting-balance support: Feet supported Sitting balance-Leahy Scale: Fair     Standing balance support: During functional activity;Bilateral upper extremity supported Standing balance-Leahy Scale: Poor            Pertinent Vitals/Pain Pain Assessment: 0-10 Pain Score: 4  Pain Location: Rt knee Pain Descriptors / Indicators: Aching (feels good to move it) Pain Intervention(s): Limited activity within patient's tolerance;Repositioned;Ice applied    Home Living Family/patient expects to be discharged to:: Private residence Living Arrangements: Spouse/significant other Available Help at Discharge: Family Type of Home: House Home Access: Stairs to enter Entrance Stairs-Rails: Right;Left;Can reach both Entrance Stairs-Number of Steps: 5 Home Layout: Two level;Able to live on main level with bedroom/bathroom Home Equipment: Gilford Rile - 2 wheels;Shower seat;Toilet riser;Cane - single point      Prior Function Level of Independence: Independent with assistive device(s)         Comments: pt using SPC to mobilize prior to surgery     Hand Dominance  Dominant Hand: Right    Extremity/Trunk Assessment   Upper Extremity Assessment Upper Extremity Assessment: Overall WFL for tasks assessed    Lower Extremity Assessment Lower Extremity Assessment: Generalized weakness    Cervical / Trunk Assessment Cervical / Trunk Assessment: Normal  Communication    Communication: No difficulties  Cognition Arousal/Alertness: Lethargic Behavior During Therapy: WFL for tasks assessed/performed Overall Cognitive Status: Within Functional Limits for tasks assessed           General Comments      Exercises Total Joint Exercises Ankle Circles/Pumps: AROM;Both;15 reps;Supine Quad Sets: AROM;Right;10 reps;Supine Heel Slides: AROM;Right;10 reps;Supine   Assessment/Plan    PT Assessment Patient needs continued PT services  PT Problem List Decreased strength;Decreased range of motion;Decreased activity tolerance;Decreased balance;Decreased mobility;Decreased knowledge of use of DME       PT Treatment Interventions DME instruction;Gait training;Stair training;Functional mobility training;Therapeutic activities;Therapeutic exercise;Balance training;Patient/family education    PT Goals (Current goals can be found in the Care Plan section)  Acute Rehab PT Goals Patient Stated Goal: get back to independence PT Goal Formulation: With patient Time For Goal Achievement: 08/01/19 Potential to Achieve Goals: Good    Frequency 7X/week    AM-PAC PT "6 Clicks" Mobility  Outcome Measure Help needed turning from your back to your side while in a flat bed without using bedrails?: A Little Help needed moving from lying on your back to sitting on the side of a flat bed without using bedrails?: A Little Help needed moving to and from a bed to a chair (including a wheelchair)?: A Lot Help needed standing up from a chair using your arms (e.g., wheelchair or bedside chair)?: A Lot Help needed to walk in hospital room?: A Lot Help needed climbing 3-5 steps with a railing? : Total 6 Click Score: 13    End of Session Equipment Utilized During Treatment: Gait belt Activity Tolerance: Patient tolerated treatment well Patient left: in bed;with call bell/phone within reach;with bed alarm set;with family/visitor present Nurse Communication: Mobility status PT  Visit Diagnosis: Muscle weakness (generalized) (M62.81);Difficulty in walking, not elsewhere classified (R26.2)    Time: 1517-6160 PT Time Calculation (min) (ACUTE ONLY): 29 min   Charges:   PT Evaluation $PT Eval Low Complexity: 1 Low PT Treatments $Therapeutic Exercise: 8-22 mins       Verner Mould, DPT Acute Rehabilitation Services  Office [phone] Pager [phone]  07/25/2019 5:52 PM

## 2019-07-25 NOTE — Anesthesia Procedure Notes (Signed)
Anesthesia Regional Block: Adductor canal block   Pre-Anesthetic Checklist: ,, timeout performed, Correct Patient, Correct Site, Correct Laterality, Correct Procedure, Correct Position, site marked, Risks and benefits discussed, pre-op evaluation,  At surgeon's request and post-op pain management  Laterality: Right  Prep: Maximum Sterile Barrier Precautions used, chloraprep       Needles:  Injection technique: Single-shot  Needle Type: Echogenic Stimulator Needle     Needle Length: 9cm  Needle Gauge: 22     Additional Needles:   Procedures:,,,, ultrasound used (permanent image in chart),,,,  Narrative:  Start time: 07/25/2019 12:21 PM End time: 07/25/2019 12:24 PM Injection made incrementally with aspirations every 5 mL.  Performed by: Personally  Anesthesiologist: Brennan Bailey, MD  Additional Notes: Risks, benefits, and alternative discussed. Patient gave consent for procedure. Patient prepped and draped in sterile fashion. Sedation administered, patient remains easily responsive to voice. Relevant anatomy identified with ultrasound guidance. Local anesthetic given in 5cc increments with no signs or symptoms of intravascular injection. No pain or paraesthesias with injection. Patient monitored throughout procedure with signs of LAST or immediate complications. Tolerated well. Ultrasound image placed in chart.  Tawny Asal, MD

## 2019-07-25 NOTE — Care Plan (Signed)
Ortho Bundle Case Management Note  Patient Details  Name: Marissa Turner MRN: [id number] Date of Birth: [birth year]  R TKA on 07-25-19 DCP:  Home with husband.  1 story home with 5 ste.  DME:  No needs. Has a RW and has handrails on toilet. PT:  [facility].  PT eval scheduled on 07-29-19.                   DME Arranged:  N/A DME Agency:  NA  HH Arranged:  NA HH Agency:  NA  Additional Comments: Please contact me with any questions of if this plan should need to change.  Marianne Sofia, RN,CCM [facility]  [phone] 07/25/2019, 3:44 PM

## 2019-07-25 NOTE — Op Note (Signed)
OPERATIVE REPORT-TOTAL KNEE ARTHROPLASTY   Pre-operative diagnosis- Osteoarthritis  Right knee(s)  Post-operative diagnosis- Osteoarthritis Right knee(s)  Procedure-  Right  Total Knee Arthroplasty  Surgeon- Marissa Plover. Masayoshi Couzens, MD  Assistant- Theresa Duty, PA-C   Anesthesia-  Adductor canal block and spinal  EBL- 25 ml   Drains Hemovac  Tourniquet time-  Total Tourniquet Time Documented: Thigh (Right) - 33 minutes Total: Thigh (Right) - 33 minutes     Complications- None  Condition-PACU - hemodynamically stable.   Brief Clinical Note  Marissa Turner is a 79 y.o. year old female with end stage OA of her right knee with progressively worsening pain and dysfunction. She has constant pain, with activity and at rest and significant functional deficits with difficulties even with ADLs. She has had extensive non-op management including analgesics, injections of cortisone and viscosupplements, and home exercise program, but remains in significant pain with significant dysfunction.Radiographs show bone on bone arthritis medial and patellofemoral. She presents now for right Total Knee Arthroplasty.    Procedure in detail---   The patient is brought into the operating room and positioned supine on the operating table. After successful administration of  Adductor canal block and spinal,   a tourniquet is placed high on the  Right thigh(s) and the lower extremity is prepped and draped in the usual sterile fashion. Time out is performed by the operating team and then the  Right lower extremity is wrapped in Esmarch, knee flexed and the tourniquet inflated to 300 mmHg.       A midline incision is made with a ten blade through the subcutaneous tissue to the level of the extensor mechanism. A fresh blade is used to make a medial parapatellar arthrotomy. Soft tissue over the proximal medial tibia is subperiosteally elevated to the joint line with a knife and into the semimembranosus bursa with  a Cobb elevator. Soft tissue over the proximal lateral tibia is elevated with attention being paid to avoiding the patellar tendon on the tibial tubercle. The patella is everted, knee flexed 90 degrees and the ACL and PCL are removed. Findings are bone on bone medial and patellofemoral with large global osteophytes.        The drill is used to create a starting hole in the distal femur and the canal is thoroughly irrigated with sterile saline to remove the fatty contents. The 5 degree Right  valgus alignment guide is placed into the femoral canal and the distal femoral cutting block is pinned to remove 10 mm off the distal femur. Resection is made with an oscillating saw.      The tibia is subluxed forward and the menisci are removed. The extramedullary alignment guide is placed referencing proximally at the medial aspect of the tibial tubercle and distally along the second metatarsal axis and tibial crest. The block is pinned to remove 23mm off the more deficient medial  side. Resection is made with an oscillating saw. Size 2is the most appropriate size for the tibia and the proximal tibia is prepared with the modular drill and keel punch for that size.      The femoral sizing guide is placed and size 2.5 is most appropriate. Rotation is marked off the epicondylar axis and confirmed by creating a rectangular flexion gap at 90 degrees. The size 2.5 cutting block is pinned in this rotation and the anterior, posterior and chamfer cuts are made with the oscillating saw. The intercondylar block is then placed and that cut is made.  Trial size 2 tibial component, trial size 2.5 posterior stabilized femur and a 12.5  mm posterior stabilized rotating platform insert trial is placed. Full extension is achieved with excellent varus/valgus and anterior/posterior balance throughout full range of motion. The patella is everted and thickness measured to be 22  mm. Free hand resection is taken to 12 mm, a 32 template is  placed, lug holes are drilled, trial patella is placed, and it tracks normally. Osteophytes are removed off the posterior femur with the trial in place. All trials are removed and the cut bone surfaces prepared with pulsatile lavage. Cement is mixed and once ready for implantation, the size 2 tibial implant, size  2.5 posterior stabilized femoral component, and the size 32 patella are cemented in place and the patella is held with the clamp. The trial insert is placed and the knee held in full extension. The Exparel (20 ml mixed with 60 ml saline) is injected into the extensor mechanism, posterior capsule, medial and lateral gutters and subcutaneous tissues.  All extruded cement is removed and once the cement is hard the permanent 12.5 mm posterior stabilized rotating platform insert is placed into the tibial tray.      The wound is copiously irrigated with saline solution and the extensor mechanism closed over a hemovac drain with #1 V-loc suture. The tourniquet is released for a total tourniquet time of 33  minutes. Flexion against gravity is 140 degrees and the patella tracks normally. Subcutaneous tissue is closed with 2.0 vicryl and subcuticular with running 4.0 Monocryl. The incision is cleaned and dried and steri-strips and a bulky sterile dressing are applied. The limb is placed into a knee immobilizer and the patient is awakened and transported to recovery in stable condition.      Please note that a surgical assistant was a medical necessity for this procedure in order to perform it in a safe and expeditious manner. Surgical assistant was necessary to retract the ligaments and vital neurovascular structures to prevent injury to them and also necessary for proper positioning of the limb to allow for anatomic placement of the prosthesis.   Marissa Plover Makael Stein, MD    07/25/2019, 1:56 PM

## 2019-07-25 NOTE — Discharge Instructions (Signed)
Marissa Arabian, MD Total Joint Specialist [facility] [address] [place], [place] [address] [phone]  TOTAL KNEE REPLACEMENT POSTOPERATIVE DIRECTIONS    Knee Rehabilitation, Guidelines Following Surgery  Results after knee surgery are often greatly improved when you follow the exercise, range of motion and muscle strengthening exercises prescribed by your doctor. Safety measures are also important to protect the knee from further injury. If any of these exercises cause you to have increased pain or swelling in your knee joint, decrease the amount until you are comfortable again and slowly increase them. If you have problems or questions, call your caregiver or physical therapist for advice.   HOME CARE INSTRUCTIONS  . Remove items at home which could result in a fall. This includes throw rugs or furniture in walking pathways.  . ICE to the affected knee as much as tolerated. Icing helps control swelling. If the swelling is well controlled you will be more comfortable and rehab easier. Continue to use ice on the knee for pain and swelling from surgery. You may notice swelling that will progress down to the foot and ankle. This is normal after surgery. Elevate the leg when you are not up walking on it.    . Continue to use the breathing machine which will help keep your temperature down. It is common for your temperature to cycle up and down following surgery, especially at night when you are not up moving around and exerting yourself. The breathing machine keeps your lungs expanded and your temperature down. . Do not place pillow under the operative knee, focus on keeping the knee straight while resting  DIET You may resume your previous home diet once you are discharged from the hospital.  DRESSING / [place] / SHOWERING . Keep your bulky bandage on for 2 days. On the third post-operative day you may remove the Ace bandage and gauze. There is a  waterproof adhesive bandage on your skin which will stay in place until your first follow-up appointment. Once you remove this you will not need to place another bandage . You may begin showering 3 days following surgery, but do not submerge the incision under water.  ACTIVITY For the first 5 days, the key is rest and control of pain and swelling . Do your home exercises twice a day starting on post-operative day 3. On the days you go to physical therapy, just do the home exercises once that day. . You should rest, ice and elevate the leg for 50 minutes out of every hour. Get up and walk/stretch for 10 minutes per hour. After 5 days you can increase your activity slowly as tolerated. . Walk with your walker as instructed. Use the walker until you are comfortable transitioning to a cane. Walk with the cane in the opposite hand of the operative leg. You may discontinue the cane once you are comfortable and walking steadily. . Avoid periods of inactivity such as sitting longer than an hour when not asleep. This helps prevent blood clots.  . You may discontinue the knee immobilizer once you are able to perform a straight leg raise while lying down. . You may resume a sexual relationship in one month or when given the OK by your doctor.  . You may return to work once you are cleared by your doctor.  . Do not drive a car for 6 weeks or until released by your surgeon.  . Do not drive while taking narcotics.  TED HOSE  STOCKINGS Wear the elastic stockings on both legs for three weeks following surgery during the day. You may remove them at night for sleeping.  WEIGHT BEARING Weight bearing as tolerated with assist device (walker, cane, etc) as directed, use it as long as suggested by your surgeon or therapist, typically at least 4-6 weeks.  POSTOPERATIVE CONSTIPATION PROTOCOL Constipation - defined medically as fewer than three stools per week and severe constipation as less than one stool per  week.  One of the most common issues patients have following surgery is constipation.  Even if you have a regular bowel pattern at home, your normal regimen is likely to be disrupted due to multiple reasons following surgery.  Combination of anesthesia, postoperative narcotics, change in appetite and fluid intake all can affect your bowels.  In order to avoid complications following surgery, here are some recommendations in order to help you during your recovery period.  . Colace (docusate) - Pick up an over-the-counter form of Colace or another stool softener and take twice a day as long as you are requiring postoperative pain medications.  Take with a full glass of water daily.  If you experience loose stools or diarrhea, hold the colace until you stool forms back up. If your symptoms do not get better within 1 week or if they get worse, check with your doctor. . Dulcolax (bisacodyl) - Pick up over-the-counter and take as directed by the product packaging as needed to assist with the movement of your bowels.  Take with a full glass of water.  Use this product as needed if not relieved by Colace only.  . MiraLax (polyethylene glycol) - Pick up over-the-counter to have on hand. MiraLax is a solution that will increase the amount of water in your bowels to assist with bowel movements.  Take as directed and can mix with a glass of water, juice, soda, coffee, or tea. Take if you go more than two days without a movement. Do not use MiraLax more than once per day. Call your doctor if you are still constipated or irregular after using this medication for 7 days in a row.  If you continue to have problems with postoperative constipation, please contact the office for further assistance and recommendations.  If you experience "the worst abdominal pain ever" or develop nausea or vomiting, please contact the office immediatly for further recommendations for treatment.  ITCHING If you experience itching with your  medications, try taking only a single pain pill, or even half a pain pill at a time.  You can also use Benadryl over the counter for itching or also to help with sleep.   MEDICATIONS See your medication summary on the "After Visit Summary" that the nursing staff will review with you prior to discharge.  You may have some home medications which will be placed on hold until you complete the course of blood thinner medication.  It is important for you to complete the blood thinner medication as prescribed by your surgeon.  Continue your approved medications as instructed at time of discharge.  PRECAUTIONS . If you experience chest pain or shortness of breath - call 911 immediately for transfer to the hospital emergency department.  . If you develop a fever greater that 101 F, purulent drainage from wound, increased redness or drainage from wound, foul odor from the wound/dressing, or calf pain - CONTACT YOUR SURGEON.  FOLLOW-UP APPOINTMENTS Make sure you keep all of your appointments after your operation with your surgeon and caregivers. You should call the office at the above phone number and make an appointment for approximately two weeks after the date of your surgery or on the date instructed by your surgeon outlined in the "After Visit Summary".  RANGE OF MOTION AND STRENGTHENING EXERCISES  Rehabilitation of the knee is important following a knee injury or an operation. After just a few days of immobilization, the muscles of the thigh which control the knee become weakened and shrink (atrophy). Knee exercises are designed to build up the tone and strength of the thigh muscles and to improve knee motion. Often times heat used for twenty to thirty minutes before working out will loosen up your tissues and help with improving the range of motion but do not use heat for the first two weeks following surgery. These exercises can be done on a training  (exercise) mat, on the floor, on a table or on a bed. Use what ever works the best and is most comfortable for you Knee exercises include:  . Leg Lifts - While your knee is still immobilized in a splint or cast, you can do straight leg raises. Lift the leg to 60 degrees, hold for 3 sec, and slowly lower the leg. Repeat 10-20 times 2-3 times daily. Perform this exercise against resistance later as your knee gets better.  Javier Docker and Hamstring Sets - Tighten up the muscle on the front of the thigh (Quad) and hold for 5-10 sec. Repeat this 10-20 times hourly. Hamstring sets are done by pushing the foot backward against an object and holding for 5-10 sec. Repeat as with quad sets.   Leg Slides: Lying on your back, slowly slide your foot toward your buttocks, bending your knee up off the floor (only go as far as is comfortable). Then slowly slide your foot back down until your leg is flat on the floor again.  Angel Wings: Lying on your back spread your legs to the side as far apart as you can without causing discomfort.  A rehabilitation program following serious knee injuries can speed recovery and prevent re-injury in the future due to weakened muscles. Contact your doctor or a physical therapist for more information on knee rehabilitation.   IF YOU ARE TRANSFERRED TO A SKILLED REHAB FACILITY If the patient is transferred to a skilled rehab facility following release from the hospital, a list of the current medications will be sent to the facility for the patient to continue.  When discharged from the skilled rehab facility, please have the facility set up the patient's [place] prior to being released. Also, the skilled facility will be responsible for providing the patient with their medications at time of release from the facility to include their pain medication, the muscle relaxants, and their blood thinner medication. If the patient is still at the rehab facility at time of the two  week follow up appointment, the skilled rehab facility will also need to assist the patient in arranging follow up appointment in our office and any transportation needs.  MAKE SURE YOU:  . Understand these instructions.  . Get help right away if you are not doing well or get worse.   DENTAL ANTIBIOTICS:  In most cases prophylactic antibiotics for Dental procdeures after total joint surgery are not necessary.  Exceptions are as follows:  1. History of prior total joint infection  2. Severely immunocompromised (  Organ Transplant, cancer chemotherapy, Rheumatoid biologic meds such as [place])  3. Poorly controlled diabetes (A1C &gt; 8.0, blood glucose over 200)  If you have one of these conditions, contact your surgeon for an antibiotic prescription, prior to your dental procedure.    Pick up stool softner and laxative for home use following surgery while on pain medications. Do not submerge incision under water. Please use good hand washing techniques while changing dressing each day. May shower starting three days after surgery. Please use a clean towel to pat the incision dry following showers. Continue to use ice for pain and swelling after surgery. Do not use any lotions or creams on the incision until instructed by your surgeon.

## 2019-07-25 NOTE — Anesthesia Postprocedure Evaluation (Signed)
Anesthesia Post Note  Patient: Marissa Turner  Procedure(s) Performed: TOTAL KNEE ARTHROPLASTY (Right Knee)     Patient location during evaluation: PACU Anesthesia Type: Spinal Level of consciousness: awake and alert and oriented Pain management: pain level controlled Vital Signs Assessment: post-procedure vital signs reviewed and stable Respiratory status: spontaneous breathing, nonlabored ventilation and respiratory function stable Cardiovascular status: blood pressure returned to baseline Postop Assessment: no apparent nausea or vomiting, spinal receding, no headache and no backache Anesthetic complications: no   No complications documented.  Last Vitals:  Vitals:   07/25/19 1545 07/25/19 1600  BP: 90/66 98/61  Pulse: 63 (!) 56  Resp: 12 13  Temp: 36.8 C   SpO2: 95% 96%    Last Pain:  Vitals:   07/25/19 1545  TempSrc:   PainSc: [place]

## 2019-07-25 NOTE — Progress Notes (Signed)
Assisted Dr. Howze with right, ultrasound guided, adductor canal block. Side rails up, monitors on throughout procedure. See vital signs in flow sheet. Tolerated Procedure well.  

## 2019-07-25 NOTE — Anesthesia Procedure Notes (Signed)
Spinal  Patient location during procedure: OR Start time: 07/25/2019 12:54 PM End time: 07/25/2019 12:57 PM Staffing Performed: anesthesiologist  Anesthesiologist: Brennan Bailey, MD Preanesthetic Checklist Completed: patient identified, IV checked, risks and benefits discussed, surgical consent, monitors and equipment checked, pre-op evaluation and timeout performed Spinal Block Patient position: sitting Prep: DuraPrep and site prepped and draped Patient monitoring: continuous pulse ox, blood pressure and heart rate Approach: midline Location: L3-4 Injection technique: single-shot Needle Needle type: Pencan  Needle gauge: 24 G Needle length: 9 cm Additional Notes Risks, benefits, and alternative discussed. Patient gave consent to procedure. Prepped and draped in sitting position. Patient sedated but responsive to voice. Clear CSF obtained after one needle redirection. Positive terminal aspiration. No pain or paraesthesias with injection. Patient tolerated procedure well. Vital signs stable. Tawny Asal, MD

## 2019-07-26 ENCOUNTER — Encounter (HOSPITAL_COMMUNITY): Payer: Self-pay | Admitting: Orthopedic Surgery

## 2019-07-26 DIAGNOSIS — F329 Major depressive disorder, single episode, unspecified: Secondary | ICD-10-CM | POA: Diagnosis not present

## 2019-07-26 DIAGNOSIS — F410 Panic disorder [episodic paroxysmal anxiety] without agoraphobia: Secondary | ICD-10-CM | POA: Diagnosis not present

## 2019-07-26 DIAGNOSIS — J449 Chronic obstructive pulmonary disease, unspecified: Secondary | ICD-10-CM | POA: Diagnosis not present

## 2019-07-26 DIAGNOSIS — M1711 Unilateral primary osteoarthritis, right knee: Secondary | ICD-10-CM | POA: Diagnosis not present

## 2019-07-26 DIAGNOSIS — Z7901 Long term (current) use of anticoagulants: Secondary | ICD-10-CM | POA: Diagnosis not present

## 2019-07-26 DIAGNOSIS — I482 Chronic atrial fibrillation, unspecified: Secondary | ICD-10-CM | POA: Diagnosis not present

## 2019-07-26 LAB — CBC
HCT: 38.7 % (ref 36.0–46.0)
Hemoglobin: 12.1 g/dL (ref 12.0–15.0)
MCH: 27.4 pg (ref 26.0–34.0)
MCHC: 31.3 g/dL (ref 30.0–36.0)
MCV: 87.8 fL (ref 80.0–100.0)
Platelets: 246 10*3/uL (ref 150–400)
RBC: 4.41 MIL/uL (ref 3.87–5.11)
WBC: 11.6 10*3/uL — ABNORMAL HIGH (ref 4.0–10.5)
nRBC: 0 % (ref 0.0–0.2)

## 2019-07-26 LAB — BASIC METABOLIC PANEL
Anion gap: 12 (ref 5–15)
BUN: 10 mg/dL (ref 8–23)
CO2: 28 mmol/L (ref 22–32)
Calcium: 8.1 mg/dL — ABNORMAL LOW (ref 8.9–10.3)
Chloride: 100 mmol/L (ref 98–111)
Creatinine, Ser: 0.54 mg/dL (ref 0.44–1.00)
GFR calc Af Amer: >60 mL/min (ref >60)
GFR calc non Af Amer: >60 mL/min (ref >60)
Glucose, Bld: 159 mg/dL — ABNORMAL HIGH (ref 70–99)
Potassium: 3.3 mmol/L — ABNORMAL LOW (ref 3.5–5.1)
Sodium: 140 mmol/L (ref 135–145)

## 2019-07-26 MED ORDER — HYDROMORPHONE HCL 2 MG PO TABS: 2.0000 mg | ORAL_TABLET | ORAL | 0 refills | Status: DC | PRN

## 2019-07-26 MED ORDER — POTASSIUM CHLORIDE CRYS ER 20 MEQ PO TBCR
40.0000 meq | EXTENDED_RELEASE_TABLET | Freq: Once | ORAL | Status: AC
  Administered 2019-07-26: 40 meq via ORAL
  Filled 2019-07-26: qty 2

## 2019-07-26 MED ORDER — METHOCARBAMOL 500 MG PO TABS: 500.0000 mg | ORAL_TABLET | Freq: Four times a day (QID) | ORAL | 0 refills | Status: DC | PRN

## 2019-07-26 NOTE — Progress Notes (Signed)
Physical Therapy Treatment Patient Details Name: Marissa Turner MRN: [id number] DOB: 1940/05/22 Today's Date: 07/26/2019    History of Present Illness Patient is 79 y.o. female s/p Rt TKA on 07/25/19 with PMH significant for Hypotension, COPD, lung cancer, A-fib, panic attacks, depression, Lt THA on 10/18/18.    PT Comments    Pt ambulated in hallway again this afternoon and practiced safe stair technique.  Spouse present and observed session.  Pt and spouse had no further question,s and HEP handout provided.  Pt feels ready for d/c home today.   Follow Up Recommendations  Follow surgeon's recommendation for DC plan and follow-up therapies     Equipment Recommendations  None recommended by PT    Recommendations for Other Services       Precautions / Restrictions Precautions Precautions: Fall;Knee Restrictions RLE Weight Bearing: Weight bearing as tolerated    Mobility  Bed Mobility               General bed mobility comments: pt in recliner on arrival  Transfers Overall transfer level: Needs assistance Equipment used: Rolling walker (2 wheeled) Transfers: Sit to/from Stand Sit to Stand: Min guard         General transfer comment: verbal cues for UE and LE positioning, min/guard for safety  Ambulation/Gait Ambulation/Gait assistance: Min guard Gait Distance (Feet): 160 Feet Assistive device: Rolling walker (2 wheeled) Gait Pattern/deviations: Step-through pattern;Antalgic;Decreased stance time - right     General Gait Details: verbal cues for sequence, RW positioning, posture, knee flexion   Stairs Stairs: Yes Stairs assistance: Min guard Stair Management: Step to pattern;Forwards;Two rails Number of Stairs: 3 General stair comments: verbal cues for safety and sequence; performed twice; spouse present and observed   Wheelchair Mobility    Modified Rankin (Stroke Patients Only)       Balance                                             Cognition Arousal/Alertness: Awake/alert Behavior During Therapy: WFL for tasks assessed/performed Overall Cognitive Status: Within Functional Limits for tasks assessed                                        Exercises      General Comments        Pertinent Vitals/Pain Pain Assessment: 0-10 Pain Score: 3  Pain Location: Rt knee Pain Descriptors / Indicators: Sore Pain Intervention(s): Repositioned;Monitored during session    Home Living                      Prior Function            PT Goals (current goals can now be found in the care plan section) Progress towards PT goals: Progressing toward goals    Frequency    7X/week      PT Plan Current plan remains appropriate    Co-evaluation              AM-PAC PT "6 Clicks" Mobility   Outcome Measure  Help needed turning from your back to your side while in a flat bed without using bedrails?: A Little Help needed moving from lying on your back to sitting on the side of a flat bed without using bedrails?: A  Little Help needed moving to and from a bed to a chair (including a wheelchair)?: A Little Help needed standing up from a chair using your arms (e.g., wheelchair or bedside chair)?: A Little Help needed to walk in hospital room?: A Little Help needed climbing 3-5 steps with a railing? : A Little 6 Click Score: 18    End of Session Equipment Utilized During Treatment: Gait belt Activity Tolerance: Patient tolerated treatment well Patient left: with call bell/phone within reach;in chair;with family/visitor present   PT Visit Diagnosis: Muscle weakness (generalized) (M62.81);Difficulty in walking, not elsewhere classified (R26.2)     Time: 1245-8099 PT Time Calculation (min) (ACUTE ONLY): 12 min  Charges:  $Gait Training: 8-22 mins           Marissa Turner, DPT Acute Rehabilitation Services Pager: [phone] Office: [phone]  Marissa Turner 07/26/2019, 3:59  PM

## 2019-07-26 NOTE — Progress Notes (Signed)
Subjective: 1 Day Post-Op Procedure(s) (LRB): TOTAL KNEE ARTHROPLASTY (Right) Patient reports pain as mild.   Patient seen in rounds by Dr. Wynelle Link. Patient is well, and has had no acute complaints or problems other than pain in the right knee. Denies chest pain, SOB, or calf pain. No issues overnight. Foley catheter to be removed this AM. We will continue therapy today.   Objective: Vital signs in last 24 hours: Temp:  [97.4 F (36.3 C)-98.4 F (36.9 C)] 97.8 F (36.6 C) (06/29 0559) Pulse Rate:  [45-76] 76 (06/29 0559) Resp:  [11-21] 16 (06/29 0559) BP: (90-154)/(45-99) 154/93 (06/29 0559) SpO2:  [93 %-100 %] 97 % (06/29 0559) Weight:  [65.8 kg] 65.8 kg (06/28 1750)  Intake/Output from previous day:  Intake/Output Summary (Last 24 hours) at 07/26/2019 0718 Last data filed at 07/26/2019 5885 Gross per 24 hour  Intake 2495.64 ml  Output 1900 ml  Net 595.64 ml     Intake/Output this shift: No intake/output data recorded.  Labs: Recent Labs    07/26/19 0323  HGB 12.1   Recent Labs    07/26/19 0323  WBC 11.6*  RBC 4.41  HCT 38.7  PLT 246   Recent Labs    07/26/19 0323  NA 140  K 3.3*  CL 100  CO2 28  BUN 10  CREATININE 0.54  GLUCOSE 159*  CALCIUM 8.1*   No results for input(s): LABPT, INR in the last 72 hours.  Exam: General - Patient is Alert and Oriented Extremity - Neurologically intact Neurovascular intact Sensation intact distally Dorsiflexion/Plantar flexion intact Dressing - dressing C/D/I Motor Function - intact, moving foot and toes well on exam.   Past Medical History:  Diagnosis Date  . Atrial fibrillation, chronic ([place]) 2004   left atrial thrombus in 2004; near syncope in 2008; echocardiogram in 2008-normal EF, mild left atrial enlargement, no valvular abnormalities, lipomatous hypertrophy; no aortic atheroma on TEE in 2004  . Cancer ([facility])    LYMPH NODE    and lung cancer  . Chronic anticoagulation 2004   warfarin  . COPD  (chronic obstructive pulmonary disease) ([place])    denies  . Depression   . DJD (degenerative joint disease), lumbosacral    Also hip  . Dysrhythmia    chronic AFib  . Family history of adverse reaction to anesthesia    sister PONV  . Fracture of wrist    Left  . History of kidney stones    once  . Hypotension   . Panic attacks   . Peptic ulcer disease    s/p [place] II  . PONV (postoperative nausea and vomiting)   . Tobacco abuse    40 pack years; quit attempt in 2012    Assessment/Plan: 1 Day Post-Op Procedure(s) (LRB): TOTAL KNEE ARTHROPLASTY (Right) Principal Problem:   OA (osteoarthritis) of knee Active Problems:   Primary osteoarthritis of right knee  Estimated body mass index is 26.52 kg/m as calculated from the following:   Height as of this encounter: 5\' 2"  (1.575 m).   Weight as of this encounter: 65.8 kg. Advance diet Up with therapy D/C IV fluids  Patient's anticipated LOS is less than 2 midnights, meeting these requirements: - Lives within 1 hour of care - Has a competent adult at home to recover with post-op recover - NO history of  - Chronic pain requiring opiods  - Diabetes  - Coronary Artery Disease  - Heart failure  - Heart attack  - Stroke  -  DVT/VTE  - Respiratory Failure/COPD  - Renal failure  - Anemia  - Advanced Liver disease  DVT Prophylaxis - Eliquis Weight bearing as tolerated. Continue therapy.  Plan is to go Home after hospital stay. Plan for discharge later today if cleared by PT. Scheduled for outpatient therapy at [facility] in [place] Follow-up in the office July 13  Theresa Duty, [facility] [phone] 07/26/2019, 7:18 AM

## 2019-07-26 NOTE — Plan of Care (Signed)
  Problem: Education: Goal: Knowledge of General Education information will improve Description Including pain rating scale, medication(s)/side effects and non-pharmacologic comfort measures Outcome: Progressing   

## 2019-07-26 NOTE — Progress Notes (Signed)
Physical Therapy Treatment Patient Details Name: Marissa Turner MRN: [id number] DOB: 05/19/1940 Today's Date: 07/26/2019    History of Present Illness Patient is 79 y.o. female s/p Rt TKA on 07/25/19 with PMH significant for Hypotension, COPD, lung cancer, A-fib, panic attacks, depression, Lt THA on 10/18/18.    PT Comments    Pt ambulated in hallway and performed LE exercises.  Pt will need to practice steps prior to d/c which is likely later today.   Follow Up Recommendations  Follow surgeon's recommendation for DC plan and follow-up therapies     Equipment Recommendations  None recommended by PT    Recommendations for Other Services       Precautions / Restrictions Precautions Precautions: Fall;Knee Restrictions RLE Weight Bearing: Weight bearing as tolerated    Mobility  Bed Mobility               General bed mobility comments: pt in recliner on arrival  Transfers Overall transfer level: Needs assistance Equipment used: Rolling walker (2 wheeled) Transfers: Sit to/from Stand Sit to Stand: Min guard         General transfer comment: verbal cues for UE and LE positioning, min/guard for safety  Ambulation/Gait Ambulation/Gait assistance: Min guard Gait Distance (Feet): 180 Feet Assistive device: Rolling walker (2 wheeled) Gait Pattern/deviations: Step-through pattern;Antalgic;Decreased stance time - right     General Gait Details: verbal cues for sequence, RW positioning, posture, knee flexion   Stairs             Wheelchair Mobility    Modified Rankin (Stroke Patients Only)       Balance                                            Cognition Arousal/Alertness: Awake/alert Behavior During Therapy: WFL for tasks assessed/performed Overall Cognitive Status: Within Functional Limits for tasks assessed                                        Exercises Total Joint Exercises Ankle Circles/Pumps:  AROM;Both;15 reps Quad Sets: AROM;Right;10 reps Heel Slides: AROM;Right;10 reps;Supine Hip ABduction/ADduction: AROM;Right;10 reps Straight Leg Raises: AROM;Right Long Arc Quad: AROM;Seated;Right;10 reps    General Comments        Pertinent Vitals/Pain Pain Assessment: 0-10 Pain Score: 1  Pain Location: Rt knee Pain Descriptors / Indicators: Sore Pain Intervention(s): Repositioned;Monitored during session    Home Living                      Prior Function            PT Goals (current goals can now be found in the care plan section) Progress towards PT goals: Progressing toward goals    Frequency    7X/week      PT Plan Current plan remains appropriate    Co-evaluation              AM-PAC PT "6 Clicks" Mobility   Outcome Measure  Help needed turning from your back to your side while in a flat bed without using bedrails?: A Little Help needed moving from lying on your back to sitting on the side of a flat bed without using bedrails?: A Little Help needed moving to and from a bed to  a chair (including a wheelchair)?: A Little Help needed standing up from a chair using your arms (e.g., wheelchair or bedside chair)?: A Little Help needed to walk in hospital room?: A Little Help needed climbing 3-5 steps with a railing? : A Little 6 Click Score: 18    End of Session Equipment Utilized During Treatment: Gait belt Activity Tolerance: Patient tolerated treatment well Patient left: with call bell/phone within reach;in chair;with chair alarm set Nurse Communication: Mobility status PT Visit Diagnosis: Muscle weakness (generalized) (M62.81);Difficulty in walking, not elsewhere classified (R26.2)     Time: 6606-0045 PT Time Calculation (min) (ACUTE ONLY): 24 min  Charges:  $Gait Training: 8-22 mins $Therapeutic Exercise: 8-22 mins                     Jannette Spanner PT, DPT Acute Rehabilitation Services Pager: [phone] Office:  [phone]  York Ram E 07/26/2019, 11:41 AM

## 2019-07-27 NOTE — Discharge Summary (Signed)
Physician Discharge Summary   Patient ID: Marissa Turner MRN: [id number] DOB/AGE: 02/25/1940 79 y.o.  Admit date: 07/25/2019 Discharge date: 07/26/2019  Primary Diagnosis: Osteoarthritis, right knee   Admission Diagnoses:  Past Medical History:  Diagnosis Date  . Atrial fibrillation, chronic ([place]) 2004   left atrial thrombus in 2004; near syncope in 2008; echocardiogram in 2008-normal EF, mild left atrial enlargement, no valvular abnormalities, lipomatous hypertrophy; no aortic atheroma on TEE in 2004  . Cancer (HCC)    LYMPH NODE    and lung cancer  . Chronic anticoagulation 2004   warfarin  . COPD (chronic obstructive pulmonary disease) ([place])    denies  . Depression   . DJD (degenerative joint disease), lumbosacral    Also hip  . Dysrhythmia    chronic AFib  . Family history of adverse reaction to anesthesia    sister PONV  . Fracture of wrist    Left  . History of kidney stones    once  . Hypotension   . Panic attacks   . Peptic ulcer disease    s/p Billroth II  . PONV (postoperative nausea and vomiting)   . Tobacco abuse    40 pack years; quit attempt in 2012   Discharge Diagnoses:   Principal Problem:   OA (osteoarthritis) of knee Active Problems:   Primary osteoarthritis of right knee  Estimated body mass index is 26.52 kg/m as calculated from the following:   Height as of this encounter: 5\' 2"  (1.575 m).   Weight as of this encounter: 65.8 kg.  Procedure:  Procedure(s) (LRB): TOTAL KNEE ARTHROPLASTY (Right)   Consults: None  HPI: Marissa Turner is a 79 y.o. year old female with end stage OA of her right knee with progressively worsening pain and dysfunction. She has constant pain, with activity and at rest and significant functional deficits with difficulties even with ADLs. She has had extensive non-op management including analgesics, injections of cortisone and viscosupplements, and home exercise program, but remains in significant pain with significant  dysfunction.Radiographs show bone on bone arthritis medial and patellofemoral. She presents now for right Total Knee Arthroplasty.    Laboratory Data: Admission on 07/25/2019, Discharged on 07/26/2019  Component Date Value Ref Range Status  . WBC 07/26/2019 11.6* 4.0 - 10.5 K/uL Final  . RBC 07/26/2019 4.41  3.87 - 5.11 MIL/uL Final  . Hemoglobin 07/26/2019 12.1  12.0 - 15.0 g/dL Final  . HCT 07/26/2019 38.7  36 - 46 % Final  . MCV 07/26/2019 87.8  80.0 - 100.0 fL Final  . MCH 07/26/2019 27.4  26.0 - 34.0 pg Final  . MCHC 07/26/2019 31.3  30.0 - 36.0 g/dL Final  . RDW 07/26/2019 Not Measured  11.5 - 15.5 % Final  . Platelets 07/26/2019 246  150 - 400 K/uL Final  . nRBC 07/26/2019 0.0  0.0 - 0.2 % Final   Performed at [facility], [place] [address] [place], [place] [address]  . Sodium 07/26/2019 140  135 - 145 mmol/L Final  . Potassium 07/26/2019 3.3* 3.5 - 5.1 mmol/L Final  . Chloride 07/26/2019 100  98 - 111 mmol/L Final  . CO2 07/26/2019 28  22 - 32 mmol/L Final  . Glucose, Bld 07/26/2019 159* 70 - 99 mg/dL Final   Glucose reference range applies only to samples taken after fasting for at least 8 hours.  . BUN 07/26/2019 10  8 - 23 mg/dL Final  . Creatinine, Ser 07/26/2019 0.54  0.44 -  1.00 mg/dL Final  . Calcium 07/26/2019 8.1* 8.9 - 10.3 mg/dL Final  . GFR calc non Af Amer 07/26/2019 >60  >60 mL/min Final  . GFR calc Af Amer 07/26/2019 >60  >60 mL/min Final  . Anion gap 07/26/2019 12  5 - 15 Final   Performed at [facility], [place] [address], [place], [place] [address]  Hospital Outpatient Visit on 07/21/2019  Component Date Value Ref Range Status  . SARS Coronavirus 2 07/21/2019 NEGATIVE  NEGATIVE Final   Comment: (NOTE) SARS-CoV-2 target nucleic acids are NOT DETECTED.  The SARS-CoV-2 RNA is generally detectable in upper and lower respiratory specimens during the acute phase of infection. Negative results do not preclude SARS-CoV-2  infection, do not rule out co-infections with other pathogens, and should not be used as the sole basis for treatment or other patient management decisions. Negative results must be combined with clinical observations, patient history, and epidemiological information. The expected result is Negative.  Fact Sheet for Patients: SugarRoll.be  Fact Sheet for Healthcare Providers: https://www.woods-mathews.com/  This test is not yet approved or cleared by the [place] FDA and  has been authorized for detection and/or diagnosis of SARS-CoV-2 by FDA under an Emergency Use Authorization (EUA). This EUA will remain  in effect (meaning this test can be used) for the duration of the COVID-19 declaration under Se                          ction 564(b)(1) of the Act, 21 U.S.C. section 360bbb-3(b)(1), unless the authorization is terminated or revoked sooner.  Performed at [facility], [place] [address] [place], [place] [address]   Hospital Outpatient Visit on 07/19/2019  Component Date Value Ref Range Status  . MRSA, PCR 07/19/2019 NEGATIVE  NEGATIVE Final  . Staphylococcus aureus 07/19/2019 NEGATIVE  NEGATIVE Final   Comment: (NOTE) The Xpert SA Assay (FDA approved for NASAL specimens in patients 73 years of age and older), is one component of a comprehensive surveillance program. It is not intended to diagnose infection nor to guide or monitor treatment. Performed at [facility], [place] [address], [place], [place] [address]   . aPTT 07/19/2019 34  24 - 36 seconds Final   Performed at [facility], [place] [address] [place], [place] [address]  . WBC 07/19/2019 9.4  4.0 - 10.5 K/uL Final  . RBC 07/19/2019 4.94  3.87 - 5.11 MIL/uL Final  . Hemoglobin 07/19/2019 13.9  12.0 - 15.0 g/dL Final  . HCT 07/19/2019 43.8  36 - 46 % Final  . MCV 07/19/2019 88.7  80.0 - 100.0 fL Final  . MCH 07/19/2019 28.1  26.0 -  34.0 pg Final  . MCHC 07/19/2019 31.7  30.0 - 36.0 g/dL Final  . RDW 07/19/2019 Not Measured  11.5 - 15.5 % Final  . Platelets 07/19/2019 290  150 - 400 K/uL Final  . nRBC 07/19/2019 0.0  0.0 - 0.2 % Final   Performed at [facility], [place] [address], [place], [place] [address]  . Sodium 07/19/2019 143  135 - 145 mmol/L Final  . Potassium 07/19/2019 3.8  3.5 - 5.1 mmol/L Final  . Chloride 07/19/2019 101  98 - 111 mmol/L Final  . CO2 07/19/2019 31  22 - 32 mmol/L Final  . Glucose, Bld 07/19/2019 101* 70 - 99 mg/dL Final   Glucose reference range applies only to samples taken after fasting for  at least 8 hours.  . BUN 07/19/2019 10  8 - 23 mg/dL Final  . Creatinine, Ser 07/19/2019 0.64  0.44 - 1.00 mg/dL Final  . Calcium 07/19/2019 8.9  8.9 - 10.3 mg/dL Final  . Total Protein 07/19/2019 7.2  6.5 - 8.1 g/dL Final  . Albumin 07/19/2019 4.1  3.5 - 5.0 g/dL Final  . AST 07/19/2019 31  15 - 41 U/L Final  . ALT 07/19/2019 24  0 - 44 U/L Final  . Alkaline Phosphatase 07/19/2019 78  38 - 126 U/L Final  . Total Bilirubin 07/19/2019 0.5  0.3 - 1.2 mg/dL Final  . GFR calc non Af Amer 07/19/2019 >60  >60 mL/min Final  . GFR calc Af Amer 07/19/2019 >60  >60 mL/min Final  . Anion gap 07/19/2019 11  5 - 15 Final   Performed at [facility], [place] [address] [place], [place] [address]  . Prothrombin Time 07/19/2019 14.7  11.4 - 15.2 seconds Final  . INR 07/19/2019 1.2  0.8 - 1.2 Final   Comment: (NOTE) INR goal varies based on device and disease states. Performed at [facility], [place] [address], [place], [place] [address]   . ABO/RH(D) 07/19/2019 A POS   Final  . Antibody Screen 07/19/2019 NEG   Final  . Sample Expiration 07/19/2019 07/28/2019,2359   Final  . Extend sample reason 07/19/2019    Final                   Value:NO TRANSFUSIONS OR PREGNANCY IN THE PAST 3 MONTHS Performed at [place] [address]  [place], [place] [address]   Anti-coag visit on 06/15/2019  Component Date Value Ref Range Status  . INR 06/15/2019 1.9* 2.0 - 3.0 Final     X-Rays:No results found.  EKG: Orders placed or performed in visit on 07/13/19  . EKG 12-Lead     Hospital Course: Marissa Turner is a 79 y.o. who was admitted to [facility]. They were brought to the operating room on 07/25/2019 and underwent Procedure(s): TOTAL KNEE ARTHROPLASTY.  Patient tolerated the procedure well and was later transferred to the recovery room and then to the orthopaedic floor for postoperative care. They were given PO and IV analgesics for pain control following their surgery. They were given 24 hours of postoperative antibiotics of  Anti-infectives (From admission, onward)   Start     Dose/Rate Route Frequency Ordered Stop   07/25/19 1900  ceFAZolin (ANCEF) IVPB 2g/100 mL premix        2 g 200 mL/hr over 30 Minutes Intravenous Every 6 hours 07/25/19 1622 07/26/19 0102   07/25/19 1015  ceFAZolin (ANCEF) IVPB 2g/100 mL premix        2 g 200 mL/hr over 30 Minutes Intravenous On call to O.R. 07/25/19 1012 07/25/19 1259     and started on DVT prophylaxis in the form of Eliquis.   PT and OT were ordered for total joint protocol. Discharge planning consulted to help with postop disposition and equipment needs.  Patient had a good night on the evening of surgery. They started to get up OOB with therapy on POD #0. Pt was seen during rounds and was ready to go home pending progress with therapy. She worked with therapy on POD #1 and was meeting her goals. Pt was discharged to home later that day in stable condition.  The PDMP database was reviewed prior to any opioid medications being  prescribed to this patient.   Diet: Cardiac diet Activity: WBAT Follow-up: in 2 weeks Disposition: Home Discharged Condition: stable   Discharge Instructions    Call MD / Call 911   Complete by: As directed    If you experience chest pain or  shortness of breath, CALL 911 and be transported to the hospital emergency room.  If you develope a fever above 101 F, pus (white drainage) or increased drainage or redness at the wound, or calf pain, call your surgeon's office.   Change dressing   Complete by: As directed    You may remove the bulky bandage (ACE wrap and gauze) two days after surgery. You will have an adhesive waterproof bandage underneath. Leave this in place until your first follow-up appointment.   Constipation Prevention   Complete by: As directed    Drink plenty of fluids.  Prune juice may be helpful.  You may use a stool softener, such as Colace (over the counter) 100 mg twice a day.  Use MiraLax (over the counter) for constipation as needed.   Diet - low sodium heart healthy   Complete by: As directed    Do not put a pillow under the knee. Place it under the heel.   Complete by: As directed    Driving restrictions   Complete by: As directed    No driving for two weeks   TED hose   Complete by: As directed    Use stockings (TED hose) for three weeks on both leg(s).  You may remove them at night for sleeping.   Weight bearing as tolerated   Complete by: As directed      Allergies as of 07/26/2019      Reactions   Estrogens Swelling   edema   Codeine Swelling, Rash   Penicillins Other (See Comments)   Break out in welts. Did it involve swelling of the face/tongue/throat, SOB, or low BP? No Did it involve sudden or severe rash/hives, skin peeling, or any reaction on the inside of your mouth or nose? No Did you need to seek medical attention at a hospital or doctor's office? No When did it last happen? Childhood allergy If all above answers are "NO", may proceed with cephalosporin use. Tolerated Cephalosporin Date: 07/26/19.   Diphenhydramine Hcl Other (See Comments)   hallucinations       Medication List    TAKE these medications   acetaminophen 650 MG CR tablet Commonly known as: TYLENOL Take  1,300 mg by mouth every evening.   ALPRAZolam 1 MG tablet Commonly known as: XANAX Take 1 tablet (1 mg total) by mouth 3 (three) times daily as needed for anxiety. What changed: when to take this   apixaban 5 MG Tabs tablet Commonly known as: Eliquis Take 1 tablet (5 mg total) by mouth 2 (two) times daily.   cholecalciferol 25 MCG (1000 UNIT) tablet Commonly known as: VITAMIN D3 Take 1,000 Units by mouth daily.   citalopram 20 MG tablet Commonly known as: CELEXA Take 20 mg by mouth daily.   diltiazem 240 MG 24 hr capsule Commonly known as: CARDIZEM CD Take 1 capsule (240 mg total) by mouth daily.   HYDROmorphone 2 MG tablet Commonly known as: DILAUDID Take 1-2 tablets (2-4 mg total) by mouth every 4 (four) hours as needed for severe pain.   methocarbamol 500 MG tablet Commonly known as: ROBAXIN Take 1 tablet (500 mg total) by mouth every 6 (six) hours as needed for muscle spasms.  vitamin C with rose hips 500 MG tablet Take 500 mg by mouth daily.            Discharge Care Instructions  (From admission, onward)         Start     Ordered   07/26/19 0000  Weight bearing as tolerated        07/26/19 0731   07/26/19 0000  Change dressing       Comments: You may remove the bulky bandage (ACE wrap and gauze) two days after surgery. You will have an adhesive waterproof bandage underneath. Leave this in place until your first follow-up appointment.   07/26/19 0731          Follow-up Information    Gaynelle Arabian, MD. Go on 08/09/2019.   Specialty: Orthopedic Surgery Why: You are scheduled for a post-op appointment on 08-09-19 at 4:30 pm.  Contact information: [address] [place] [address] [phone]        [facility]. Go on 07/29/2019.   Specialties: Physical Therapy, Occupational Therapy Why: You are scheduled for a physical therapy appointment on 07-29-19 at 11:00 am.  Contact information: [address] [place] [address] [phone]               Signed: Theresa Duty, PA-C Orthopedic Surgery 07/27/2019, 10:14 AM

## 2019-07-29 DIAGNOSIS — M25661 Stiffness of right knee, not elsewhere classified: Secondary | ICD-10-CM | POA: Diagnosis not present

## 2019-07-29 DIAGNOSIS — M25561 Pain in right knee: Secondary | ICD-10-CM | POA: Diagnosis not present

## 2019-07-29 DIAGNOSIS — R262 Difficulty in walking, not elsewhere classified: Secondary | ICD-10-CM | POA: Diagnosis not present

## 2019-07-29 DIAGNOSIS — M25461 Effusion, right knee: Secondary | ICD-10-CM | POA: Diagnosis not present

## 2019-08-01 DIAGNOSIS — M25561 Pain in right knee: Secondary | ICD-10-CM | POA: Diagnosis not present

## 2019-08-01 DIAGNOSIS — M25661 Stiffness of right knee, not elsewhere classified: Secondary | ICD-10-CM | POA: Diagnosis not present

## 2019-08-01 DIAGNOSIS — M25461 Effusion, right knee: Secondary | ICD-10-CM | POA: Diagnosis not present

## 2019-08-01 DIAGNOSIS — R262 Difficulty in walking, not elsewhere classified: Secondary | ICD-10-CM | POA: Diagnosis not present

## 2019-08-03 DIAGNOSIS — R262 Difficulty in walking, not elsewhere classified: Secondary | ICD-10-CM | POA: Diagnosis not present

## 2019-08-03 DIAGNOSIS — M25561 Pain in right knee: Secondary | ICD-10-CM | POA: Diagnosis not present

## 2019-08-03 DIAGNOSIS — M25661 Stiffness of right knee, not elsewhere classified: Secondary | ICD-10-CM | POA: Diagnosis not present

## 2019-08-03 DIAGNOSIS — M25461 Effusion, right knee: Secondary | ICD-10-CM | POA: Diagnosis not present

## 2019-08-05 DIAGNOSIS — R262 Difficulty in walking, not elsewhere classified: Secondary | ICD-10-CM | POA: Diagnosis not present

## 2019-08-05 DIAGNOSIS — M25461 Effusion, right knee: Secondary | ICD-10-CM | POA: Diagnosis not present

## 2019-08-05 DIAGNOSIS — M25661 Stiffness of right knee, not elsewhere classified: Secondary | ICD-10-CM | POA: Diagnosis not present

## 2019-08-05 DIAGNOSIS — M25561 Pain in right knee: Secondary | ICD-10-CM | POA: Diagnosis not present

## 2019-08-08 DIAGNOSIS — M25661 Stiffness of right knee, not elsewhere classified: Secondary | ICD-10-CM | POA: Diagnosis not present

## 2019-08-08 DIAGNOSIS — M25461 Effusion, right knee: Secondary | ICD-10-CM | POA: Diagnosis not present

## 2019-08-08 DIAGNOSIS — M25561 Pain in right knee: Secondary | ICD-10-CM | POA: Diagnosis not present

## 2019-08-08 DIAGNOSIS — R262 Difficulty in walking, not elsewhere classified: Secondary | ICD-10-CM | POA: Diagnosis not present

## 2019-08-09 ENCOUNTER — Inpatient Hospital Stay (HOSPITAL_COMMUNITY): Payer: Medicare Other | Attending: Hematology

## 2019-08-09 DIAGNOSIS — D509 Iron deficiency anemia, unspecified: Secondary | ICD-10-CM | POA: Insufficient documentation

## 2019-08-09 DIAGNOSIS — Z79899 Other long term (current) drug therapy: Secondary | ICD-10-CM | POA: Insufficient documentation

## 2019-08-09 DIAGNOSIS — I4891 Unspecified atrial fibrillation: Secondary | ICD-10-CM | POA: Insufficient documentation

## 2019-08-09 DIAGNOSIS — Z85118 Personal history of other malignant neoplasm of bronchus and lung: Secondary | ICD-10-CM | POA: Insufficient documentation

## 2019-08-09 DIAGNOSIS — Z8572 Personal history of non-Hodgkin lymphomas: Secondary | ICD-10-CM | POA: Insufficient documentation

## 2019-08-09 DIAGNOSIS — Z7901 Long term (current) use of anticoagulants: Secondary | ICD-10-CM | POA: Insufficient documentation

## 2019-08-09 DIAGNOSIS — Z902 Acquired absence of lung [part of]: Secondary | ICD-10-CM | POA: Insufficient documentation

## 2019-08-10 DIAGNOSIS — R262 Difficulty in walking, not elsewhere classified: Secondary | ICD-10-CM | POA: Diagnosis not present

## 2019-08-10 DIAGNOSIS — M25461 Effusion, right knee: Secondary | ICD-10-CM | POA: Diagnosis not present

## 2019-08-10 DIAGNOSIS — M25561 Pain in right knee: Secondary | ICD-10-CM | POA: Diagnosis not present

## 2019-08-10 DIAGNOSIS — M25661 Stiffness of right knee, not elsewhere classified: Secondary | ICD-10-CM | POA: Diagnosis not present

## 2019-08-11 DIAGNOSIS — Z96651 Presence of right artificial knee joint: Secondary | ICD-10-CM | POA: Insufficient documentation

## 2019-08-12 DIAGNOSIS — M25461 Effusion, right knee: Secondary | ICD-10-CM | POA: Diagnosis not present

## 2019-08-12 DIAGNOSIS — M25561 Pain in right knee: Secondary | ICD-10-CM | POA: Diagnosis not present

## 2019-08-12 DIAGNOSIS — R262 Difficulty in walking, not elsewhere classified: Secondary | ICD-10-CM | POA: Diagnosis not present

## 2019-08-12 DIAGNOSIS — M25661 Stiffness of right knee, not elsewhere classified: Secondary | ICD-10-CM | POA: Diagnosis not present

## 2019-08-15 DIAGNOSIS — M25561 Pain in right knee: Secondary | ICD-10-CM | POA: Diagnosis not present

## 2019-08-15 DIAGNOSIS — M25461 Effusion, right knee: Secondary | ICD-10-CM | POA: Diagnosis not present

## 2019-08-15 DIAGNOSIS — R262 Difficulty in walking, not elsewhere classified: Secondary | ICD-10-CM | POA: Diagnosis not present

## 2019-08-15 DIAGNOSIS — M25661 Stiffness of right knee, not elsewhere classified: Secondary | ICD-10-CM | POA: Diagnosis not present

## 2019-08-16 ENCOUNTER — Other Ambulatory Visit: Payer: Self-pay

## 2019-08-16 ENCOUNTER — Ambulatory Visit (HOSPITAL_COMMUNITY): Payer: Medicare Other | Admitting: Hematology

## 2019-08-16 ENCOUNTER — Inpatient Hospital Stay (HOSPITAL_COMMUNITY): Payer: Medicare Other

## 2019-08-16 DIAGNOSIS — R262 Difficulty in walking, not elsewhere classified: Secondary | ICD-10-CM | POA: Diagnosis not present

## 2019-08-16 DIAGNOSIS — M25461 Effusion, right knee: Secondary | ICD-10-CM | POA: Diagnosis not present

## 2019-08-16 DIAGNOSIS — Z902 Acquired absence of lung [part of]: Secondary | ICD-10-CM | POA: Diagnosis not present

## 2019-08-16 DIAGNOSIS — M25561 Pain in right knee: Secondary | ICD-10-CM | POA: Diagnosis not present

## 2019-08-16 DIAGNOSIS — Z720 Tobacco use: Secondary | ICD-10-CM | POA: Diagnosis not present

## 2019-08-16 DIAGNOSIS — M25661 Stiffness of right knee, not elsewhere classified: Secondary | ICD-10-CM | POA: Diagnosis not present

## 2019-08-16 DIAGNOSIS — F17211 Nicotine dependence, cigarettes, in remission: Secondary | ICD-10-CM | POA: Diagnosis not present

## 2019-08-16 DIAGNOSIS — I10 Essential (primary) hypertension: Secondary | ICD-10-CM | POA: Diagnosis not present

## 2019-08-16 DIAGNOSIS — C3492 Malignant neoplasm of unspecified part of left bronchus or lung: Secondary | ICD-10-CM | POA: Diagnosis not present

## 2019-08-16 DIAGNOSIS — R32 Unspecified urinary incontinence: Secondary | ICD-10-CM | POA: Diagnosis not present

## 2019-08-16 DIAGNOSIS — Z85118 Personal history of other malignant neoplasm of bronchus and lung: Secondary | ICD-10-CM | POA: Diagnosis not present

## 2019-08-16 DIAGNOSIS — D509 Iron deficiency anemia, unspecified: Secondary | ICD-10-CM | POA: Diagnosis not present

## 2019-08-16 DIAGNOSIS — Z8572 Personal history of non-Hodgkin lymphomas: Secondary | ICD-10-CM | POA: Diagnosis not present

## 2019-08-16 DIAGNOSIS — C8580 Other specified types of non-Hodgkin lymphoma, unspecified site: Secondary | ICD-10-CM

## 2019-08-16 DIAGNOSIS — I4891 Unspecified atrial fibrillation: Secondary | ICD-10-CM | POA: Diagnosis not present

## 2019-08-16 DIAGNOSIS — Z7901 Long term (current) use of anticoagulants: Secondary | ICD-10-CM | POA: Diagnosis not present

## 2019-08-16 DIAGNOSIS — M1990 Unspecified osteoarthritis, unspecified site: Secondary | ICD-10-CM | POA: Diagnosis not present

## 2019-08-16 DIAGNOSIS — F411 Generalized anxiety disorder: Secondary | ICD-10-CM | POA: Diagnosis not present

## 2019-08-16 DIAGNOSIS — Z79899 Other long term (current) drug therapy: Secondary | ICD-10-CM | POA: Diagnosis not present

## 2019-08-16 DIAGNOSIS — I482 Chronic atrial fibrillation, unspecified: Secondary | ICD-10-CM | POA: Diagnosis not present

## 2019-08-16 DIAGNOSIS — R7301 Impaired fasting glucose: Secondary | ICD-10-CM | POA: Diagnosis not present

## 2019-08-16 LAB — CBC WITH DIFFERENTIAL/PLATELET
Abs Immature Granulocytes: 0.03 10*3/uL (ref 0.00–0.07)
Basophils Absolute: 0.1 10*3/uL (ref 0.0–0.1)
Basophils Relative: 1 %
Eosinophils Absolute: 0.4 10*3/uL (ref 0.0–0.5)
Eosinophils Relative: 4 %
HCT: 40.1 % (ref 36.0–46.0)
Hemoglobin: 12.1 g/dL (ref 12.0–15.0)
Immature Granulocytes: 0 %
Lymphocytes Relative: 41 %
Lymphs Abs: 4.4 10*3/uL — ABNORMAL HIGH (ref 0.7–4.0)
MCH: 28.3 pg (ref 26.0–34.0)
MCHC: 30.2 g/dL (ref 30.0–36.0)
MCV: 93.9 fL (ref 80.0–100.0)
Monocytes Absolute: 1.1 10*3/uL — ABNORMAL HIGH (ref 0.1–1.0)
Monocytes Relative: 10 %
Neutro Abs: 4.8 10*3/uL (ref 1.7–7.7)
Neutrophils Relative %: 44 %
Platelets: 559 10*3/uL — ABNORMAL HIGH (ref 150–400)
RBC: 4.27 MIL/uL (ref 3.87–5.11)
RDW: 21.2 % — ABNORMAL HIGH (ref 11.5–15.5)
WBC: 10.9 10*3/uL — ABNORMAL HIGH (ref 4.0–10.5)
nRBC: 0 % (ref 0.0–0.2)

## 2019-08-16 LAB — IRON AND TIBC
Iron: 41 ug/dL (ref 28–170)
Saturation Ratios: 10 % — ABNORMAL LOW (ref 10.4–31.8)
TIBC: 394 ug/dL (ref 250–450)
UIBC: 353 ug/dL

## 2019-08-16 LAB — FERRITIN: Ferritin: 189 ng/mL (ref 11–307)

## 2019-08-19 DIAGNOSIS — M25661 Stiffness of right knee, not elsewhere classified: Secondary | ICD-10-CM | POA: Diagnosis not present

## 2019-08-19 DIAGNOSIS — M25461 Effusion, right knee: Secondary | ICD-10-CM | POA: Diagnosis not present

## 2019-08-19 DIAGNOSIS — M25561 Pain in right knee: Secondary | ICD-10-CM | POA: Diagnosis not present

## 2019-08-19 DIAGNOSIS — R262 Difficulty in walking, not elsewhere classified: Secondary | ICD-10-CM | POA: Diagnosis not present

## 2019-08-25 ENCOUNTER — Ambulatory Visit (HOSPITAL_COMMUNITY): Payer: Medicare Other | Admitting: Hematology

## 2019-08-25 DIAGNOSIS — M25561 Pain in right knee: Secondary | ICD-10-CM | POA: Diagnosis not present

## 2019-08-25 DIAGNOSIS — R262 Difficulty in walking, not elsewhere classified: Secondary | ICD-10-CM | POA: Diagnosis not present

## 2019-08-25 DIAGNOSIS — M25461 Effusion, right knee: Secondary | ICD-10-CM | POA: Diagnosis not present

## 2019-08-25 DIAGNOSIS — M25661 Stiffness of right knee, not elsewhere classified: Secondary | ICD-10-CM | POA: Diagnosis not present

## 2019-08-29 ENCOUNTER — Ambulatory Visit (HOSPITAL_COMMUNITY): Payer: Medicare Other | Admitting: Hematology

## 2019-08-30 DIAGNOSIS — M25561 Pain in right knee: Secondary | ICD-10-CM | POA: Diagnosis not present

## 2019-08-30 DIAGNOSIS — Z96651 Presence of right artificial knee joint: Secondary | ICD-10-CM | POA: Diagnosis not present

## 2019-08-30 DIAGNOSIS — R262 Difficulty in walking, not elsewhere classified: Secondary | ICD-10-CM | POA: Diagnosis not present

## 2019-08-30 DIAGNOSIS — Z471 Aftercare following joint replacement surgery: Secondary | ICD-10-CM | POA: Diagnosis not present

## 2019-08-30 DIAGNOSIS — M25461 Effusion, right knee: Secondary | ICD-10-CM | POA: Diagnosis not present

## 2019-08-30 DIAGNOSIS — M25661 Stiffness of right knee, not elsewhere classified: Secondary | ICD-10-CM | POA: Diagnosis not present

## 2019-09-01 DIAGNOSIS — M25561 Pain in right knee: Secondary | ICD-10-CM | POA: Diagnosis not present

## 2019-09-01 DIAGNOSIS — R262 Difficulty in walking, not elsewhere classified: Secondary | ICD-10-CM | POA: Diagnosis not present

## 2019-09-01 DIAGNOSIS — M25461 Effusion, right knee: Secondary | ICD-10-CM | POA: Diagnosis not present

## 2019-09-01 DIAGNOSIS — M25661 Stiffness of right knee, not elsewhere classified: Secondary | ICD-10-CM | POA: Diagnosis not present

## 2019-09-05 ENCOUNTER — Inpatient Hospital Stay (HOSPITAL_COMMUNITY): Payer: Medicare Other | Attending: Hematology | Admitting: Hematology

## 2019-09-05 ENCOUNTER — Other Ambulatory Visit: Payer: Self-pay

## 2019-09-05 VITALS — BP 100/63 | HR 87 | Temp 96.9°F | Resp 18 | Wt 136.0 lb

## 2019-09-05 DIAGNOSIS — J449 Chronic obstructive pulmonary disease, unspecified: Secondary | ICD-10-CM | POA: Insufficient documentation

## 2019-09-05 DIAGNOSIS — C3492 Malignant neoplasm of unspecified part of left bronchus or lung: Secondary | ICD-10-CM

## 2019-09-05 DIAGNOSIS — D509 Iron deficiency anemia, unspecified: Secondary | ICD-10-CM | POA: Diagnosis not present

## 2019-09-05 DIAGNOSIS — I4891 Unspecified atrial fibrillation: Secondary | ICD-10-CM | POA: Diagnosis not present

## 2019-09-05 NOTE — Patient Instructions (Signed)
[place] at [facility] Discharge Instructions  You were seen today by Dr. Delton Coombes. He went over your recent results. You will be scheduled for a CT scan of your chest. Stay physically active as much as tolerable. Dr. Delton Coombes will see you back in 3 months for labs and follow up.   Thank you for choosing [place] at [facility] to provide your oncology and hematology care.  To afford each patient quality time with our provider, please arrive at least 15 minutes before your scheduled appointment time.   If you have a lab appointment with the [facility] please come in thru the Main Entrance and check in at the main information desk  You need to re-schedule your appointment should you arrive 10 or more minutes late.  We strive to give you quality time with our providers, and arriving late affects you and other patients whose appointments are after yours.  Also, if you no show three or more times for appointments you may be dismissed from the clinic at the providers discretion.     Again, thank you for choosing [facility].  Our hope is that these requests will decrease the amount of time that you wait before being seen by our physicians.       _____________________________________________________________  Should you have questions after your visit to [facility], please contact our office at [phone] between the hours of 8:00 a.m. and 4:30 p.m.  Voicemails left after 4:00 p.m. will not be returned until the following business day.  For prescription refill requests, have your pharmacy contact our office and allow 72 hours.    Cancer Center Support Programs:   > Cancer Support Group  2nd Tuesday of the month 1pm-2pm, Journey Room

## 2019-09-05 NOTE — Progress Notes (Signed)
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   PRIOR THERAPY: VATS and left lower lobectomy on 04/15/2018.  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   10/06/2013 PET scan   Hypermetabolic thickening within the right vallecular consists with primary head and neck Turner.  Single enlarged hypermetabolic right level IIa lymph node consistent local nodal metastasis.   10/31/2013 - 11/29/2013 Chemotherapy   Bendamustine/Rituxan x 2 cycles   12/19/2013 Progression   PET- Progression of presumed vallecular lymphoma as evidenced by increased hypermetabolism.   12/20/2013 Treatment Plan Change   Per Dr. Gari Turner, patient has a CD10 negative, non-germinal center non-Hodgkin's lymphoma.  Patient may be a candidate for additional of Revlimid to treatment plan.   12/28/2013 Echocardiogram   MUGA- Normal left ventricular wall motion with calculated ejection fraction of 60%.   01/02/2014 - 02/13/2014 Chemotherapy   R-CHOP with Neulasta support.   02/06/2014 Remission   PET- Resolution of the previously enlarged right-sided station 2 lymph  node in the neck with resolution of the abnormal right vallecular  activity in the neck. Currently no specific in findings of residual  lymphoma.    - 04/12/2014 Radiation Therapy   Dr. Lisbeth Turner   07/12/2014 Procedure   Port removed- Dr. Arnoldo Turner   Squamous cell lung Turner, left ([facility] [facility])  04/20/2018 Initial Diagnosis   Squamous cell lung Turner, left ([place])   05/24/2018 -  Chemotherapy   The patient had palonosetron (ALOXI) injection 0.25 mg, 0.25 mg,  Intravenous,  Once, 0 of 4 cycles pegfilgrastim-cbqv (UDENYCA) injection 6 mg, 6 mg, Subcutaneous, Once, 0 of 4 cycles CARBOplatin (PARAPLATIN) in sodium chloride 0.9 % 100 mL chemo infusion, , Intravenous,  Once, 0 of 4 cycles Dose modification:   (Cycle 4) PACLitaxel (TAXOL) 252 mg in sodium chloride 0.9 % 250 mL chemo infusion (> 80mg /m2), 175 mg/m2 = 252 mg (original dose ), Intravenous,  Once, 0 of 4 cycles Dose modification: 175 mg/m2 (Cycle 1, Reason: Provider Judgment) fosaprepitant (EMEND) 150 mg, dexamethasone (DECADRON) 20 mg in sodium chloride 0.9 % 145 mL IVPB, , Intravenous,  Once, 0 of 4 cycles  for chemotherapy treatment.      Turner STAGING: Turner Staging Lymphoma malignant, large cell [facility]) Staging form: Lymphoid Neoplasms, AJCC 6th Edition - Clinical stage from 01/22/2014: Stage IE - Signed by Marissa Cancer, PA-C on 04/07/2016   INTERVAL HISTORY:  Marissa Turner, a 79 y.o. female, returns for routine follow-up of her left squamous cell lung Turner. Marissa Turner was last seen on 05/17/2019.  Today she reports that she is doing well since her last visit. She had Feraheme infusions on 4/26 and 5/3. She had total knee replacement of her right knee for arthritis on 6/28 with Dr. Gaynelle Turner. Her cough is stable.  She is able to ambulate at home without a cane, but if she goes outside she take her cane with her.   REVIEW OF SYSTEMS:  Review of Systems  Constitutional: Positive for appetite change (mildly decreased) and fatigue (mild).  Respiratory: Positive for cough (stable).   Musculoskeletal: Negative  for arthralgias and myalgias.  All other systems reviewed and are negative.   PAST MEDICAL/SURGICAL HISTORY:  Past Medical History:  Diagnosis Date  . Atrial fibrillation, chronic ([place]) 2004   left atrial thrombus in 2004; near syncope in 2008; echocardiogram in 2008-normal EF, mild left atrial enlargement, no valvular abnormalities, lipomatous hypertrophy; no  aortic atheroma on TEE in 2004  . Turner ([facility])    LYMPH NODE    and lung Turner  . Chronic anticoagulation 2004   warfarin  . COPD (chronic obstructive pulmonary disease) ([place])    denies  . Depression   . DJD (degenerative joint disease), lumbosacral    Also hip  . Dysrhythmia    chronic AFib  . Family history of adverse reaction to anesthesia    sister PONV  . Fracture of wrist    Left  . History of kidney stones    once  . Hypotension   . Panic attacks   . Peptic ulcer disease    s/p Billroth II  . PONV (postoperative nausea and vomiting)   . Tobacco abuse    40 pack years; quit attempt in 2012   Past Surgical History:  Procedure Laterality Date  . ABDOMINAL HYSTERECTOMY     w/o oophorectomy  partial  . APPENDECTOMY    . CESAREAN SECTION    . CHOLECYSTECTOMY  2004   [place]   patient denies ever having undergone colonoscopy as of 2014  . GASTROJEJUNOSTOMY  1984   Billroth II; splenectomy; incidental appendectomy  . KNEE ARTHROSCOPY WITH MEDIAL MENISECTOMY Right 02/26/2015   Procedure: KNEE ARTHROSCOPY WITH MEDIAL MENISECTOMY;  Surgeon: Sanjuana Kava, MD;  Location: [facility];  Service: Orthopedics;  Laterality: Right;  . LYMPH NODE BIOPSY Left 09/15/2013   Procedure: LEFT NECK CERVICAL NODE BIOPSY;  Surgeon: Scherry Ran, MD;  Location: [facility];  Service: General;  Laterality: Left;  . PORT-A-CATH REMOVAL Right 07/12/2014   Procedure: REMOVAL PORT-A-CATH;  Surgeon: Aviva Signs Md, MD;  Location: [facility];  Service: General;  Laterality: Right;  . PORTACATH PLACEMENT Right 10/21/2013   Procedure: ATTEMPTED INSERTION PORT-A-CATH;  Surgeon: Scherry Ran, MD;  Location: [facility];  Service: General;  Laterality: Right;  . SPLENECTOMY, TOTAL    . THORACOSCOPY Left 04/15/2018   VIDEO ASSISTED THORACOSCOPY (VATS)/LEFT LOWER LOBECTOMY (Left)  . 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  [name] TOTAL HIP ARTHROPLASTY Left 10/18/2018    Procedure: TOTAL HIP ARTHROPLASTY ANTERIOR APPROACH;  Surgeon: Marissa Arabian, MD;  Location: [facility];  Service: Orthopedics;  Laterality: Left;  168min  . TOTAL KNEE ARTHROPLASTY Right 07/25/2019   Procedure: TOTAL KNEE ARTHROPLASTY;  Surgeon: Marissa Arabian, MD;  Location: [facility];  Service: Orthopedics;  Laterality: Right;  56min  . VIDEO ASSISTED THORACOSCOPY (VATS)/ LOBECTOMY Left 04/15/2018   Procedure: VIDEO ASSISTED THORACOSCOPY (VATS)/LEFT LOWER LOBECTOMY;  Surgeon: Melrose Nakayama, MD;  Location: [facility];  Service: Thoracic;  Laterality: Left;  . WEDGE RESECTION Left 04/15/2018   Procedure: LEFT UPPER WEDGE RESECTION;  Surgeon: Melrose Nakayama, MD;  Location: [facility];  Service: Thoracic;  Laterality: Left;    SOCIAL HISTORY:  Social History   Socioeconomic History  . Marital status: Married    Spouse name: Not on file  . Number of children: 2  . Years of education: Not on file  . Highest education level: Not on file  Occupational History  . Occupation: Housecleaning  Tobacco Use  .  Smoking status: Former Smoker    Packs/day: 0.50    Years: 55.00    Pack years: 27.50    Types: Cigarettes  . Smokeless tobacco: Never Used  . Tobacco comment: quit quit in june  Vaping Use  . Vaping Use: Never used  Substance and Sexual Activity  . Alcohol use: No  . Drug use: No  . Sexual activity: Yes    Birth control/protection: Surgical  Other Topics Concern  . Not on file  Social History Narrative   Married with one child and one adopted child, 1 stillbirth, and one deceased child at age 27 due to illness   No regular exercise   Social Determinants of Health   Financial Resource Strain:   . Difficulty of Paying Living Expenses:   Food Insecurity:   . Worried About Charity fundraiser in the Last Year:   . Arboriculturist in the Last Year:   Transportation Needs:   . Film/video editor (Medical):   [name] Lack of Transportation (Non-Medical):   Physical Activity:   .  Days of Exercise per Week:   . Minutes of Exercise per Session:   Stress:   . Feeling of Stress :   Social Connections:   . Frequency of Communication with Friends and Family:   . Frequency of Social Gatherings with Friends and Family:   . Attends Religious Services:   . Active Member of Clubs or Organizations:   . Attends Archivist Meetings:   [name] Marital Status:   Intimate Partner Violence:   . Fear of Current or Ex-Partner:   . Emotionally Abused:   [name] Physically Abused:   . Sexually Abused:     FAMILY HISTORY:  Family History  Problem Relation Age of Onset  . Heart attack Mother        also brother  . Breast Turner Sister   . Heart attack Brother   . Breast Turner Sister        x2  . Turner Brother        Gastric carcinoma; also father  . Cirrhosis Sister     CURRENT MEDICATIONS:  Current Outpatient Medications  Medication Sig Dispense Refill  . ALPRAZolam (XANAX) 1 MG tablet Take 1 tablet (1 mg total) by mouth 3 (three) times daily as needed for anxiety. (Patient taking differently: Take 1 mg by mouth at bedtime. ) 30 tablet 0  . apixaban (ELIQUIS) 5 MG TABS tablet Take 1 tablet (5 mg total) by mouth 2 (two) times daily. 60 tablet 5  . Ascorbic Acid (VITAMIN C WITH ROSE HIPS) 500 MG tablet Take 500 mg by mouth daily.    . cholecalciferol (VITAMIN D3) 25 MCG (1000 UNIT) tablet Take 1,000 Units by mouth daily.    . citalopram (CELEXA) 20 MG tablet Take 20 mg by mouth daily.    [name] acetaminophen (TYLENOL) 650 MG CR tablet Take 1,300 mg by mouth every evening. (Patient not taking: Reported on 09/05/2019)    . diltiazem (CARDIZEM CD) 240 MG 24 hr capsule Take 1 capsule (240 mg total) by mouth daily. 90 capsule 3  . HYDROmorphone (DILAUDID) 2 MG tablet Take 1-2 tablets (2-4 mg total) by mouth every 4 (four) hours as needed for severe pain. (Patient not taking: Reported on 09/05/2019) 56 tablet 0  . methocarbamol (ROBAXIN) 500 MG tablet Take 1 tablet (500 mg total) by mouth  every 6 (six) hours as needed for muscle spasms. (Patient not taking: Reported on  09/05/2019) 40 tablet 0   No current facility-administered medications for this visit.    ALLERGIES:  Allergies  Allergen Reactions  . Estrogens Swelling    edema  . Codeine Swelling and Rash  . Penicillins Other (See Comments)    Break out in welts. Did it involve swelling of the face/tongue/throat, SOB, or low BP? No Did it involve sudden or severe rash/hives, skin peeling, or any reaction on the inside of your mouth or nose? No Did you need to seek medical attention at a hospital or doctor's office? No When did it last happen? Childhood allergy If all above answers are "NO", may proceed with cephalosporin use.  Tolerated Cephalosporin Date: 07/26/19.    . Diphenhydramine Hcl Other (See Comments)    hallucinations     PHYSICAL EXAM:  Performance status (ECOG): 1 - Symptomatic but completely ambulatory  Vitals:   09/05/19 1502  BP: 100/63  Pulse: 87  Resp: 18  Temp: (!) 96.9 F (36.1 C)  SpO2: 93%   Wt Readings from Last 3 Encounters:  09/05/19 136 lb (61.7 kg)  07/25/19 144 lb 15.9 oz (65.8 kg)  07/19/19 145 lb (65.8 kg)   Physical Exam Vitals reviewed.  Constitutional:      Appearance: Normal appearance.  Cardiovascular:     Rate and Rhythm: Normal rate and regular rhythm.     Pulses: Normal pulses.     Heart sounds: Normal heart sounds.  Pulmonary:     Effort: Pulmonary effort is normal.     Breath sounds: Normal breath sounds.  Musculoskeletal:     Right lower leg: Edema (trace) present.     Left lower leg: No edema.  Neurological:     General: No focal deficit present.     Mental Status: She is alert and oriented to person, place, and time.  Psychiatric:        Mood and Affect: Mood normal.        Behavior: Behavior normal.      LABORATORY DATA:  I have reviewed the labs as listed.  CBC Latest Ref Rng & Units 08/16/2019 07/26/2019 07/19/2019  WBC 4.0 - 10.5  K/uL 10.9(H) 11.6(H) 9.4  Hemoglobin 12.0 - 15.0 g/dL 12.1 12.1 13.9  Hematocrit 36 - 46 % 40.1 38.7 43.8  Platelets 150 - 400 K/uL 559(H) 246 290   CMP Latest Ref Rng & Units 07/26/2019 07/19/2019 05/09/2019  Glucose 70 - 99 mg/dL 159(H) 101(H) 98  BUN 8 - 23 mg/dL 10 10 26(H)  Creatinine 0.44 - 1.00 mg/dL 0.54 0.64 1.13(H)  Sodium 135 - 145 mmol/L 140 143 137  Potassium 3.5 - 5.1 mmol/L 3.3(L) 3.8 3.7  Chloride 98 - 111 mmol/L 100 101 102  CO2 22 - 32 mmol/L 28 31 26   Calcium 8.9 - 10.3 mg/dL 8.1(L) 8.9 8.2(L)  Total Protein 6.5 - 8.1 g/dL - 7.2 7.1  Total Bilirubin 0.3 - 1.2 mg/dL - 0.5 0.4  Alkaline Phos 38 - 126 U/L - 78 77  AST 15 - 41 U/L - 31 19  ALT 0 - 44 U/L - 24 17   Lab Results  Component Value Date   TIBC 394 08/16/2019   FERRITIN 189 08/16/2019   FERRITIN 16 10/14/2013   IRONPCTSAT 10 (L) 08/16/2019    DIAGNOSTIC IMAGING:  I have independently reviewed the scans and discussed with the patient. No results found.   ASSESSMENT:  1.  T2N0 squamous cell carcinoma left lung: -VATS and left lower lobectomy on 04/15/2018  with pathology showing poorly differentiated squamous cell carcinoma, 4 cm with no visceral pleural invasion, lymph nodes negative. -She declined adjuvant chemotherapy. -CT scan of the chest on 05/16/2019 shows very small left pleural effusion with stable precarinal lymph node measuring 1.5 cm with no evidence of recurrence.   2.  Microcytic anemia: -Labs on 05/09/2019 shows hemoglobin 9.4 with MCV 77. -She has tried taking oral iron in the past and caused severe constipation.  Last Feraheme was in 2015. -Feraheme on 05/23/2019 and 05/30/2019.  3.  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  She has been disease-free for more than 5 years.  4.  Atrial fibrillation: -She is on Eliquis.   PLAN:  1.  T2N0 squamous cell carcinoma left lung: -She does not report any chest pains, cough or  hemoptysis. -I have recommended CT of the chest in 2 months for surveillance.  2.  Microcytic anemia: -After her iron infusion, her hemoglobin is staying in the normal range.  Hemoglobin on most recent labs is 12.1 with MCV of 93.9.  Ferritin is 189 and percent saturation is 10. -I plan to repeat labs in 2 months.  3.  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  4.  Atrial fibrillation: -Continue Eliquis 5 mg twice daily.  No bleeding issues.    Orders placed this encounter:  No orders of the defined types were placed in this encounter.    Derek Jack, MD [facility] [phone]   I, Milinda Antis, am acting as a scribe for Dr. Sanda Linger.  I, Derek Jack MD, have reviewed the above documentation for accuracy and completeness, and I agree with the above.

## 2019-09-30 DIAGNOSIS — Z7901 Long term (current) use of anticoagulants: Secondary | ICD-10-CM | POA: Diagnosis not present

## 2019-09-30 DIAGNOSIS — C3492 Malignant neoplasm of unspecified part of left bronchus or lung: Secondary | ICD-10-CM | POA: Diagnosis not present

## 2019-09-30 DIAGNOSIS — R32 Unspecified urinary incontinence: Secondary | ICD-10-CM | POA: Diagnosis not present

## 2019-09-30 DIAGNOSIS — F411 Generalized anxiety disorder: Secondary | ICD-10-CM | POA: Diagnosis not present

## 2019-09-30 DIAGNOSIS — M1990 Unspecified osteoarthritis, unspecified site: Secondary | ICD-10-CM | POA: Diagnosis not present

## 2019-09-30 DIAGNOSIS — D509 Iron deficiency anemia, unspecified: Secondary | ICD-10-CM | POA: Diagnosis not present

## 2019-09-30 DIAGNOSIS — I482 Chronic atrial fibrillation, unspecified: Secondary | ICD-10-CM | POA: Diagnosis not present

## 2019-09-30 DIAGNOSIS — I10 Essential (primary) hypertension: Secondary | ICD-10-CM | POA: Diagnosis not present

## 2019-09-30 DIAGNOSIS — F17211 Nicotine dependence, cigarettes, in remission: Secondary | ICD-10-CM | POA: Diagnosis not present

## 2019-09-30 DIAGNOSIS — R7301 Impaired fasting glucose: Secondary | ICD-10-CM | POA: Diagnosis not present

## 2019-09-30 DIAGNOSIS — M25561 Pain in right knee: Secondary | ICD-10-CM | POA: Diagnosis not present

## 2019-09-30 DIAGNOSIS — Z720 Tobacco use: Secondary | ICD-10-CM | POA: Diagnosis not present

## 2019-10-04 DIAGNOSIS — Z96651 Presence of right artificial knee joint: Secondary | ICD-10-CM | POA: Diagnosis not present

## 2019-10-04 DIAGNOSIS — Z471 Aftercare following joint replacement surgery: Secondary | ICD-10-CM | POA: Diagnosis not present

## 2019-10-04 DIAGNOSIS — Z96642 Presence of left artificial hip joint: Secondary | ICD-10-CM | POA: Diagnosis not present

## 2019-11-07 ENCOUNTER — Other Ambulatory Visit: Payer: Self-pay | Admitting: Thoracic Surgery (Cardiothoracic Vascular Surgery)

## 2019-11-07 DIAGNOSIS — C8580 Other specified types of non-Hodgkin lymphoma, unspecified site: Secondary | ICD-10-CM

## 2019-11-08 ENCOUNTER — Other Ambulatory Visit: Payer: Self-pay

## 2019-11-08 ENCOUNTER — Ambulatory Visit (INDEPENDENT_AMBULATORY_CARE_PROVIDER_SITE_OTHER): Payer: Medicare Other | Admitting: Thoracic Surgery (Cardiothoracic Vascular Surgery)

## 2019-11-08 ENCOUNTER — Ambulatory Visit
Admission: RE | Admit: 2019-11-08 | Discharge: 2019-11-08 | Disposition: A | Discharge status: Home / Self Care | Payer: Medicare Other | Source: Ambulatory Visit | Attending: Thoracic Surgery (Cardiothoracic Vascular Surgery) | Admitting: Thoracic Surgery (Cardiothoracic Vascular Surgery)

## 2019-11-08 ENCOUNTER — Encounter: Payer: Self-pay | Admitting: Thoracic Surgery (Cardiothoracic Vascular Surgery)

## 2019-11-08 VITALS — BP 119/76 | HR 84 | Resp 18 | Ht 60.0 in | Wt 140.8 lb

## 2019-11-08 DIAGNOSIS — C8580 Other specified types of non-Hodgkin lymphoma, unspecified site: Secondary | ICD-10-CM

## 2019-11-08 DIAGNOSIS — I517 Cardiomegaly: Secondary | ICD-10-CM | POA: Diagnosis not present

## 2019-11-08 DIAGNOSIS — C3492 Malignant neoplasm of unspecified part of left bronchus or lung: Secondary | ICD-10-CM

## 2019-11-08 DIAGNOSIS — J439 Emphysema, unspecified: Secondary | ICD-10-CM | POA: Diagnosis not present

## 2019-11-08 DIAGNOSIS — I700 Atherosclerosis of aorta: Secondary | ICD-10-CM | POA: Diagnosis not present

## 2019-11-08 NOTE — Progress Notes (Signed)
[address]       [place] [address]             [phone]     HPI: Marissa Turner returns for scheduled follow-up visit  Marissa Turner is a 79 year old woman with a history of tobacco abuse, COPD, large cell lymphoma, hypertension, chronic atrial fibrillation, DJD, and stage IIa squamous cell carcinoma of the lung.  She had a thoracoscopic left lower lobectomy in March 2020.  I last saw her in April.  She was having issues with the brachial plexopathy but was doing well from a pulmonary standpoint.  She had recently had a hip replaced.  She did follow-up with Dr. Delton Coombes in April as well.  In the interim since I last saw her she had a knee replacement.  She been doing well from a respiratory standpoint.  She still is not smoking.  She has some numbness and occasional paresthesias in the fingertips of the index, middle and part of the ring finger of her right hand.  She says that has improved but is not resolved.  Past Medical History:  Diagnosis Date  . Atrial fibrillation, chronic ([place]) 2004   left atrial thrombus in 2004; near syncope in 2008; echocardiogram in 2008-normal EF, mild left atrial enlargement, no valvular abnormalities, lipomatous hypertrophy; no aortic atheroma on TEE in 2004  . Cancer ([facility])    LYMPH NODE    and lung cancer  . Chronic anticoagulation 2004   warfarin  . COPD (chronic obstructive pulmonary disease) ([place])    denies  . Depression   . DJD (degenerative joint disease), lumbosacral    Also hip  . Dysrhythmia    chronic AFib  . Family history of adverse reaction to anesthesia    sister PONV  . Fracture of wrist    Left  . History of kidney stones    once  . Hypotension   . Panic attacks   . Peptic ulcer disease    s/p Billroth II  . PONV (postoperative nausea and vomiting)   . Tobacco abuse    40 pack years; quit attempt in 2012     Current Outpatient Medications  Medication Sig Dispense Refill  . acetaminophen  (TYLENOL) 650 MG CR tablet Take 1,300 mg by mouth every evening.     [name] ALPRAZolam (XANAX) 1 MG tablet Take 1 tablet (1 mg total) by mouth 3 (three) times daily as needed for anxiety. (Patient taking differently: Take 1 mg by mouth at bedtime. ) 30 tablet 0  . apixaban (ELIQUIS) 5 MG TABS tablet Take 1 tablet (5 mg total) by mouth 2 (two) times daily. 60 tablet 5  . citalopram (CELEXA) 20 MG tablet Take 20 mg by mouth daily.    [name] diltiazem (CARDIZEM CD) 240 MG 24 hr capsule Take 1 capsule (240 mg total) by mouth daily. 90 capsule 3   No current facility-administered medications for this visit.    Physical Exam BP 119/76 (BP Location: Left Arm, Patient Position: Sitting, Cuff Size: Normal)   Pulse 84   Resp 18   Ht 5' (1.524 m)   Wt 140 lb 12.8 oz (63.9 kg)   SpO2 93% Comment: RA  BMI 27.3 kg/m  79 year old woman in no acute distress Alert and oriented x3 with no focal motor deficits No cervical or supraclavicular adenopathy Cardiac regular rate and rhythm Lungs diminished at left base, otherwise clear  Diagnostic Tests: CHEST - 2 VIEW  COMPARISON:  PA and lateral chest 05/03/2019.  FINDINGS: Volume loss in the left chest, blunting at the left costophrenic angle consistent with scar and surgical clips in the left lung base are unchanged. Scarring and/or atelectasis in the left lung base also again seen. The right lung is clear. Cardiomegaly and emphysema are again seen. Aortic atherosclerosis noted. No pneumothorax.  IMPRESSION: Stable appearance of postoperative change in the left chest. No acute disease.  Aortic Atherosclerosis (ICD10-I70.0) and Emphysema (ICD10-J43.9).   Electronically Signed   By: Inge Rise M.D.   On: 11/08/2019 14:36 I personally reviewed the chest x-ray images and concur with the findings noted above  Impression: Marissa Turner is a 79 year old woman with a history of tobacco abuse, COPD, large cell lymphoma, hypertension, chronic  atrial fibrillation, DJD, and stage IIa squamous cell carcinoma of the lung.  She had a thoracoscopic left lower lobectomy in March 2020.  Stage IIa (T2, N0) squamous cell carcinoma left lower lobe- she is now about a year and a half out from a left lower lobectomy.  She has no evidence of recurrent disease.  She is being followed by Dr. Delton Coombes.  Tobacco abuse-quit smoking after surgery.  Now has not smoked for 18 months.  She is not having cravings.  I congratulated her on that.  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  At some point she may reach an endpoint where does not improve further but that is not bothering her nearly as badly as it was previously.  Plan: Follow-up with Dr. Delton Coombes I will be happy to see Marissa Turner back anytime in the future if I can be of any further assistance with her care  Melrose Nakayama, MD [facility] [phone]

## 2019-11-15 DIAGNOSIS — Z23 Encounter for immunization: Secondary | ICD-10-CM | POA: Diagnosis not present

## 2019-12-05 ENCOUNTER — Other Ambulatory Visit: Payer: Self-pay

## 2019-12-05 ENCOUNTER — Other Ambulatory Visit (HOSPITAL_COMMUNITY): Payer: Self-pay | Admitting: Internal Medicine

## 2019-12-05 ENCOUNTER — Inpatient Hospital Stay (HOSPITAL_COMMUNITY): Payer: Medicare Other | Attending: Hematology

## 2019-12-05 ENCOUNTER — Ambulatory Visit (HOSPITAL_COMMUNITY): Payer: Medicare Other

## 2019-12-05 ENCOUNTER — Ambulatory Visit (HOSPITAL_COMMUNITY)
Admission: RE | Admit: 2019-12-05 | Discharge: 2019-12-05 | Disposition: A | Discharge status: Home / Self Care | Payer: Medicare Other | Source: Ambulatory Visit | Attending: Internal Medicine | Admitting: Internal Medicine

## 2019-12-05 DIAGNOSIS — M7989 Other specified soft tissue disorders: Secondary | ICD-10-CM | POA: Diagnosis not present

## 2019-12-05 DIAGNOSIS — S93401A Sprain of unspecified ligament of right ankle, initial encounter: Secondary | ICD-10-CM | POA: Diagnosis not present

## 2019-12-05 DIAGNOSIS — M19071 Primary osteoarthritis, right ankle and foot: Secondary | ICD-10-CM | POA: Diagnosis not present

## 2019-12-05 DIAGNOSIS — M2011 Hallux valgus (acquired), right foot: Secondary | ICD-10-CM | POA: Diagnosis not present

## 2019-12-06 ENCOUNTER — Ambulatory Visit (HOSPITAL_COMMUNITY): Payer: Medicare Other

## 2019-12-08 ENCOUNTER — Ambulatory Visit (HOSPITAL_COMMUNITY): Payer: Medicare Other | Admitting: Hematology

## 2019-12-12 ENCOUNTER — Emergency Department (HOSPITAL_COMMUNITY)
Admission: EM | Admit: 2019-12-12 | Discharge: 2019-12-12 | Disposition: A | Discharge status: Home / Self Care | Payer: Medicare Other | Attending: Emergency Medicine | Admitting: Emergency Medicine

## 2019-12-12 ENCOUNTER — Other Ambulatory Visit: Payer: Self-pay

## 2019-12-12 ENCOUNTER — Encounter (HOSPITAL_COMMUNITY): Payer: Self-pay

## 2019-12-12 DIAGNOSIS — M25571 Pain in right ankle and joints of right foot: Secondary | ICD-10-CM | POA: Diagnosis not present

## 2019-12-12 DIAGNOSIS — M79671 Pain in right foot: Secondary | ICD-10-CM | POA: Diagnosis not present

## 2019-12-12 DIAGNOSIS — Z043 Encounter for examination and observation following other accident: Secondary | ICD-10-CM | POA: Diagnosis not present

## 2019-12-12 DIAGNOSIS — Z5321 Procedure and treatment not carried out due to patient leaving prior to being seen by health care provider: Secondary | ICD-10-CM | POA: Diagnosis not present

## 2019-12-12 NOTE — ED Triage Notes (Signed)
Pt arrived via walk in, s/p mechanical fall earlier today. Was already seen at emergent ortho earlier today, right leg splinted. States she was sent here for head CT. On blood thinners.

## 2019-12-19 ENCOUNTER — Other Ambulatory Visit: Payer: Self-pay | Admitting: *Deleted

## 2019-12-19 MED ORDER — APIXABAN 5 MG PO TABS
5.0000 mg | ORAL_TABLET | Freq: Two times a day (BID) | ORAL | 6 refills | Status: DC
Start: 2019-12-19 — End: 2020-08-01

## 2019-12-21 DIAGNOSIS — S8263XA Displaced fracture of lateral malleolus of unspecified fibula, initial encounter for closed fracture: Secondary | ICD-10-CM | POA: Insufficient documentation

## 2019-12-21 DIAGNOSIS — M25571 Pain in right ankle and joints of right foot: Secondary | ICD-10-CM | POA: Diagnosis not present

## 2019-12-21 DIAGNOSIS — S8264XA Nondisplaced fracture of lateral malleolus of right fibula, initial encounter for closed fracture: Secondary | ICD-10-CM | POA: Diagnosis not present

## 2020-01-23 DIAGNOSIS — S8264XA Nondisplaced fracture of lateral malleolus of right fibula, initial encounter for closed fracture: Secondary | ICD-10-CM | POA: Diagnosis not present

## 2020-02-03 DIAGNOSIS — Z23 Encounter for immunization: Secondary | ICD-10-CM | POA: Diagnosis not present

## 2020-02-13 DIAGNOSIS — Z720 Tobacco use: Secondary | ICD-10-CM | POA: Diagnosis not present

## 2020-02-13 DIAGNOSIS — D509 Iron deficiency anemia, unspecified: Secondary | ICD-10-CM | POA: Diagnosis not present

## 2020-02-13 DIAGNOSIS — F17211 Nicotine dependence, cigarettes, in remission: Secondary | ICD-10-CM | POA: Diagnosis not present

## 2020-02-13 DIAGNOSIS — R7301 Impaired fasting glucose: Secondary | ICD-10-CM | POA: Diagnosis not present

## 2020-02-13 DIAGNOSIS — M1990 Unspecified osteoarthritis, unspecified site: Secondary | ICD-10-CM | POA: Diagnosis not present

## 2020-02-13 DIAGNOSIS — C3492 Malignant neoplasm of unspecified part of left bronchus or lung: Secondary | ICD-10-CM | POA: Diagnosis not present

## 2020-02-13 DIAGNOSIS — M25561 Pain in right knee: Secondary | ICD-10-CM | POA: Diagnosis not present

## 2020-02-13 DIAGNOSIS — I10 Essential (primary) hypertension: Secondary | ICD-10-CM | POA: Diagnosis not present

## 2020-02-13 DIAGNOSIS — F411 Generalized anxiety disorder: Secondary | ICD-10-CM | POA: Diagnosis not present

## 2020-02-13 DIAGNOSIS — R32 Unspecified urinary incontinence: Secondary | ICD-10-CM | POA: Diagnosis not present

## 2020-02-13 DIAGNOSIS — Z7901 Long term (current) use of anticoagulants: Secondary | ICD-10-CM | POA: Diagnosis not present

## 2020-02-13 DIAGNOSIS — I482 Chronic atrial fibrillation, unspecified: Secondary | ICD-10-CM | POA: Diagnosis not present

## 2020-03-26 DIAGNOSIS — F411 Generalized anxiety disorder: Secondary | ICD-10-CM | POA: Diagnosis not present

## 2020-03-26 DIAGNOSIS — F17211 Nicotine dependence, cigarettes, in remission: Secondary | ICD-10-CM | POA: Diagnosis not present

## 2020-03-26 DIAGNOSIS — D509 Iron deficiency anemia, unspecified: Secondary | ICD-10-CM | POA: Diagnosis not present

## 2020-03-26 DIAGNOSIS — I482 Chronic atrial fibrillation, unspecified: Secondary | ICD-10-CM | POA: Diagnosis not present

## 2020-03-26 DIAGNOSIS — R32 Unspecified urinary incontinence: Secondary | ICD-10-CM | POA: Diagnosis not present

## 2020-03-26 DIAGNOSIS — I10 Essential (primary) hypertension: Secondary | ICD-10-CM | POA: Diagnosis not present

## 2020-03-26 DIAGNOSIS — Z720 Tobacco use: Secondary | ICD-10-CM | POA: Diagnosis not present

## 2020-03-26 DIAGNOSIS — R7301 Impaired fasting glucose: Secondary | ICD-10-CM | POA: Diagnosis not present

## 2020-03-26 DIAGNOSIS — M25561 Pain in right knee: Secondary | ICD-10-CM | POA: Diagnosis not present

## 2020-03-26 DIAGNOSIS — Z7901 Long term (current) use of anticoagulants: Secondary | ICD-10-CM | POA: Diagnosis not present

## 2020-03-26 DIAGNOSIS — D72829 Elevated white blood cell count, unspecified: Secondary | ICD-10-CM | POA: Diagnosis not present

## 2020-04-02 ENCOUNTER — Emergency Department (HOSPITAL_COMMUNITY)
Admission: EM | Admit: 2020-04-02 | Discharge: 2020-04-02 | Disposition: A | Discharge status: Home / Self Care | Payer: Medicare Other | Attending: Emergency Medicine | Admitting: Emergency Medicine

## 2020-04-02 DIAGNOSIS — Z96642 Presence of left artificial hip joint: Secondary | ICD-10-CM | POA: Insufficient documentation

## 2020-04-02 DIAGNOSIS — W5911XA Bitten by nonvenomous snake, initial encounter: Secondary | ICD-10-CM

## 2020-04-02 DIAGNOSIS — Z87891 Personal history of nicotine dependence: Secondary | ICD-10-CM | POA: Diagnosis not present

## 2020-04-02 DIAGNOSIS — Z96651 Presence of right artificial knee joint: Secondary | ICD-10-CM | POA: Diagnosis not present

## 2020-04-02 DIAGNOSIS — T63001A Toxic effect of unspecified snake venom, accidental (unintentional), initial encounter: Secondary | ICD-10-CM | POA: Insufficient documentation

## 2020-04-02 DIAGNOSIS — J449 Chronic obstructive pulmonary disease, unspecified: Secondary | ICD-10-CM | POA: Insufficient documentation

## 2020-04-02 DIAGNOSIS — Z8579 Personal history of other malignant neoplasms of lymphoid, hematopoietic and related tissues: Secondary | ICD-10-CM | POA: Diagnosis not present

## 2020-04-02 DIAGNOSIS — Z7901 Long term (current) use of anticoagulants: Secondary | ICD-10-CM | POA: Diagnosis not present

## 2020-04-02 NOTE — ED Provider Notes (Signed)
[facility] EMERGENCY DEPARTMENT Provider Note   CSN: [id number] Arrival date & time: 04/02/20  1340     History Chief Complaint  Patient presents with  . Snake Bite    Marissa Turner is a 80 y.o. female.  She is here for possible snakebite.  She said she was in the laundry room when she saw what she thought was a piece of string on the ground.  It possibly bit her at her medial right ankle.  This occurred about 2 hours ago.  She has no pain at the area and no redness.  There was no bleeding.  The history is provided by the patient.  Animal Bite Contact animal:  Snake Location:  Leg Leg injury location:  R ankle Time since incident:  2 hours Pain details:    Quality: no pain.   Severity:  No pain   Progression:  Unchanged Incident location:  Home Notifications:  None Animal's rabies vaccination status:  Never received Animal in possession: no   Relieved by:  None tried Worsened by:  Nothing Ineffective treatments:  None tried Associated symptoms: no fever, no numbness, no rash and no swelling        Past Medical History:  Diagnosis Date  . Atrial fibrillation, chronic ([place]) 2004   left atrial thrombus in 2004; near syncope in 2008; echocardiogram in 2008-normal EF, mild left atrial enlargement, no valvular abnormalities, lipomatous hypertrophy; no aortic atheroma on TEE in 2004  . Cancer (HCC)    LYMPH NODE    and lung cancer  . Chronic anticoagulation 2004   warfarin  . COPD (chronic obstructive pulmonary disease) ([place])    denies  . Depression   . DJD (degenerative joint disease), lumbosacral    Also hip  . Dysrhythmia    chronic AFib  . Family history of adverse reaction to anesthesia    sister PONV  . Fracture of wrist    Left  . History of kidney stones    once  . Hypotension   . Panic attacks   . Peptic ulcer disease    s/p Billroth II  . PONV (postoperative nausea and vomiting)   . Tobacco abuse    40 pack years; quit attempt in 2012    Patient  Active Problem List   Diagnosis Date Noted  . Primary osteoarthritis of right knee 07/25/2019  . OA (osteoarthritis) of hip 10/18/2018  . 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 Hamartoma of lung ([place]) 04/20/2018  . S/P Left Lower Lobectomy, Wedge Resection LUL 04/15/2018  . Positive colorectal cancer screening using Cologuard test 03/22/2018  . Depression 07/25/2015  . Knee pain, right 03/22/2015  . Iron deficiency 10/17/2013  . 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 Encounter for therapeutic drug monitoring 02/24/2013  . OA (osteoarthritis) of knee   . Atrial fibrillation, chronic ([place])   . Chronic anticoagulation   . Tobacco abuse   . Peptic ulcer disease   . Hypertension   . SPINAL STENOSIS 02/03/2008    Past Surgical History:  Procedure Laterality Date  . ABDOMINAL HYSTERECTOMY     w/o oophorectomy  partial  . APPENDECTOMY    . CESAREAN SECTION    . CHOLECYSTECTOMY  2004   [place]   patient denies ever having undergone colonoscopy as of 2014  . GASTROJEJUNOSTOMY  1984   Billroth II; splenectomy; incidental appendectomy  . KNEE ARTHROSCOPY WITH MEDIAL MENISECTOMY Right 02/26/2015  Procedure: KNEE ARTHROSCOPY WITH MEDIAL MENISECTOMY;  Surgeon: Sanjuana Kava, MD;  Location: [facility];  Service: Orthopedics;  Laterality: Right;  . LYMPH NODE BIOPSY Left 09/15/2013   Procedure: LEFT NECK CERVICAL NODE BIOPSY;  Surgeon: Scherry Ran, MD;  Location: [facility];  Service: General;  Laterality: Left;  . PORT-A-CATH REMOVAL Right 07/12/2014   Procedure: REMOVAL PORT-A-CATH;  Surgeon: Aviva Signs Md, MD;  Location: [facility];  Service: General;  Laterality: Right;  . PORTACATH PLACEMENT Right 10/21/2013   Procedure: ATTEMPTED INSERTION PORT-A-CATH;  Surgeon: Scherry Ran, MD;  Location: [facility];  Service: General;  Laterality: Right;  . SPLENECTOMY, TOTAL    . THORACOSCOPY Left 04/15/2018   VIDEO ASSISTED THORACOSCOPY  (VATS)/LEFT LOWER LOBECTOMY (Left)  . 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  [name] TOTAL HIP ARTHROPLASTY Left 10/18/2018   Procedure: TOTAL HIP ARTHROPLASTY ANTERIOR APPROACH;  Surgeon: Gaynelle Arabian, MD;  Location: [facility];  Service: Orthopedics;  Laterality: Left;  153min  . TOTAL KNEE ARTHROPLASTY Right 07/25/2019   Procedure: TOTAL KNEE ARTHROPLASTY;  Surgeon: Gaynelle Arabian, MD;  [facility];  Service: Orthopedics;  Laterality: Right;  52min  . VIDEO ASSISTED THORACOSCOPY (VATS)/ LOBECTOMY Left 04/15/2018   Procedure: VIDEO ASSISTED THORACOSCOPY (VATS)/LEFT LOWER LOBECTOMY;  Surgeon: Melrose Nakayama, MD;  Location: [place];  Service: Thoracic;  Laterality: Left;  . WEDGE RESECTION Left 04/15/2018   Procedure: LEFT UPPER WEDGE RESECTION;  Surgeon: Melrose Nakayama, MD;  Location: [place];  Service: Thoracic;  Laterality: Left;     OB History   No obstetric history on file.     Family History  Problem Relation Age of Onset  . Heart attack Mother        also brother  . Breast cancer Sister   . Heart attack Brother   . Breast cancer Sister        x2  . Cancer Brother        Gastric carcinoma; also father  . Cirrhosis Sister     Social History   Tobacco Use  . Smoking status: Former Smoker    Packs/day: 0.50    Years: 55.00    Pack years: 27.50    Types: Cigarettes  . Smokeless tobacco: Never Used  . Tobacco comment: quit quit in june  Vaping Use  . Vaping Use: Never used  Substance Use Topics  . Alcohol use: No  . Drug use: No    Home Medications Prior to Admission medications   Medication Sig Start Date End Date Taking? Authorizing Provider  acetaminophen (TYLENOL) 650 MG CR tablet Take 1,300 mg by mouth every evening.     [provider]  ALPRAZolam Duanne Moron) 1 MG tablet Take 1 tablet (1 mg total) by mouth 3 (three) times daily as needed for anxiety. Patient taking differently: Take 1 mg by mouth at bedtime.  09/04/15   Baird Cancer,  PA-C  apixaban (ELIQUIS) 5 MG TABS tablet Take 1 tablet (5 mg total) by mouth 2 (two) times daily. 12/19/19   Verta Ellen., NP  citalopram (CELEXA) 20 MG tablet Take 20 mg by mouth daily.    [provider]  diltiazem (CARDIZEM CD) 240 MG 24 hr capsule Take 1 capsule (240 mg total) by mouth daily. 01/06/18 07/25/19  Herminio Commons, MD    Allergies    Estrogens, Codeine, Penicillins, and Diphenhydramine hcl  Review of Systems   Review of Systems  Constitutional: Negative for  fever.  Respiratory: Negative for shortness of breath.   Cardiovascular: Negative for chest pain.  Gastrointestinal: Negative for abdominal pain.  Skin: Negative for rash and wound.  Neurological: Negative for numbness.    Physical Exam Updated Vital Signs BP (!) 150/92 (BP Location: Right Arm)   Pulse 73   Temp 97.7 F (36.5 C) (Oral)   Resp 18   SpO2 93%   Physical Exam Vitals and nursing note reviewed.  Constitutional:      Appearance: Normal appearance. She is well-developed and well-nourished.  HENT:     Head: Normocephalic and atraumatic.  Eyes:     Conjunctiva/sclera: Conjunctivae normal.  Cardiovascular:     Rate and Rhythm: Normal rate.     Pulses: Normal pulses.  Pulmonary:     Effort: Pulmonary effort is normal.     Breath sounds: Normal breath sounds.  Abdominal:     Tenderness: There is no abdominal tenderness. There is no guarding.  Musculoskeletal:        General: No swelling, tenderness, deformity or signs of injury.     Cervical back: Neck supple.     Right lower leg: No edema.     Left lower leg: No edema.     Comments: R ankle medial. Exam in the area that the patient felt a bite.  No see any breaks in the skin.  There is no ecchymosis erythema or edema.  Area is nontender.  Skin:    General: Skin is warm and dry.  Neurological:     General: No focal deficit present.     Mental Status: She is alert.     GCS: GCS eye subscore is 4. GCS verbal subscore is  5. GCS motor subscore is 6.  Psychiatric:        Mood and Affect: Mood and affect normal.     ED Results / Procedures / Treatments   Labs (all labs ordered are listed, but only abnormal results are displayed) Labs Reviewed - No data to display  EKG None  Radiology No results found.  Procedures Procedures   Medications Ordered in ED Medications - No data to display  ED Course  I have reviewed the triage vital signs and the nursing notes.  Pertinent labs & imaging results that were available during my care of the patient were reviewed by me and considered in my medical decision making (see chart for details).    MDM Rules/Calculators/A&P                           Final Clinical Impression(s) / ED Diagnoses Final diagnoses:  Snake bite, initial encounter    Rx / DC Orders ED Discharge Orders    None       Hayden Rasmussen, MD 04/03/20 1002

## 2020-04-02 NOTE — ED Triage Notes (Signed)
States she was in her laundry room and was bitten by a snake. 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 Slight swelling to right lower leg. Thinks it may have grazed her.

## 2020-04-04 ENCOUNTER — Other Ambulatory Visit: Payer: Self-pay | Admitting: Thoracic Surgery (Cardiothoracic Vascular Surgery)

## 2020-04-04 DIAGNOSIS — C3492 Malignant neoplasm of unspecified part of left bronchus or lung: Secondary | ICD-10-CM

## 2020-04-12 IMAGING — CR CHEST - 2 VIEW
2 series · 2 of 2 positions shown · non-contrast
Comparison: PET CT 03/18/2018, chest x-ray 12/05/2013

CLINICAL DATA: Preop, lung surgery history of smoking

EXAM:
CHEST - 2 VIEW

[w chest lat]
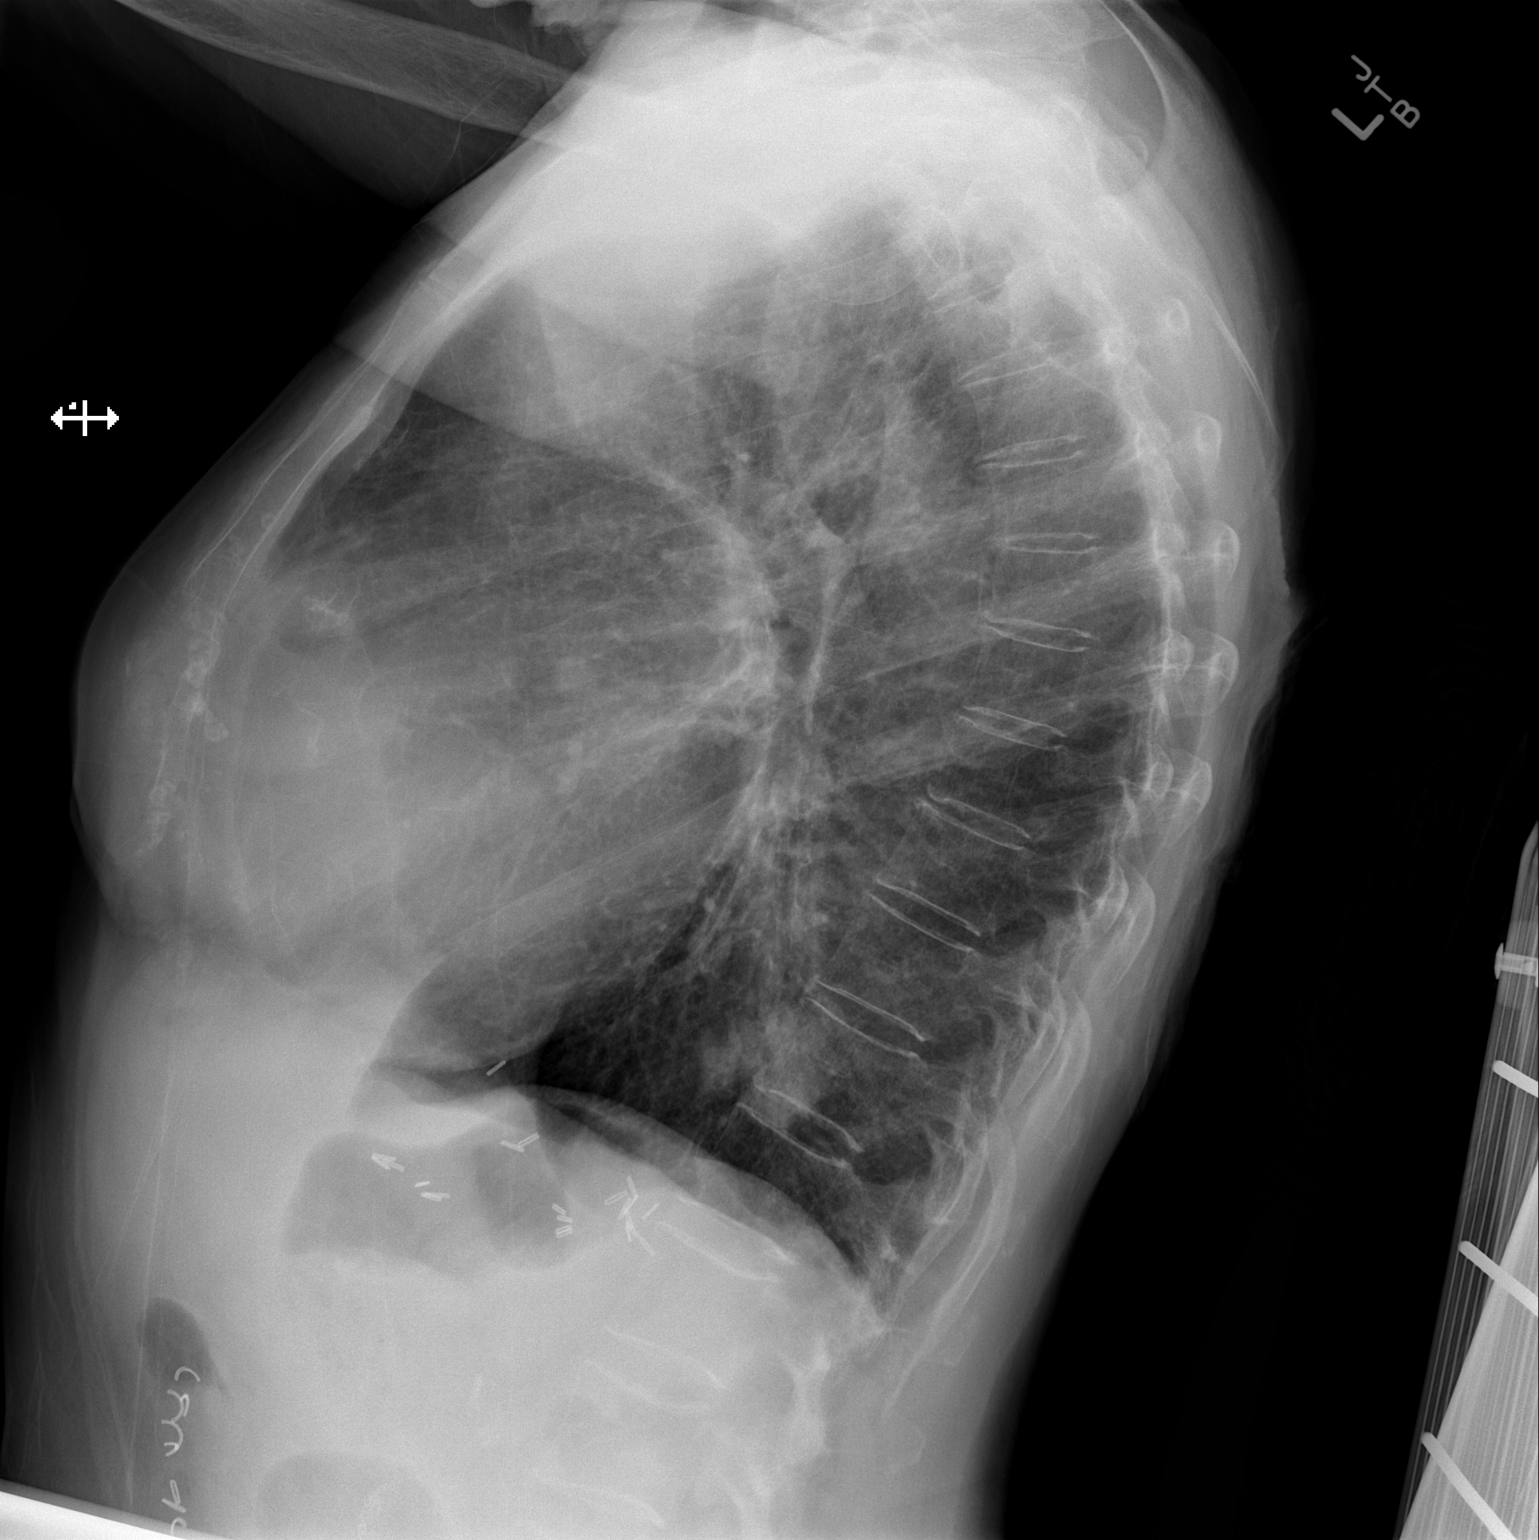

[x chest ap]
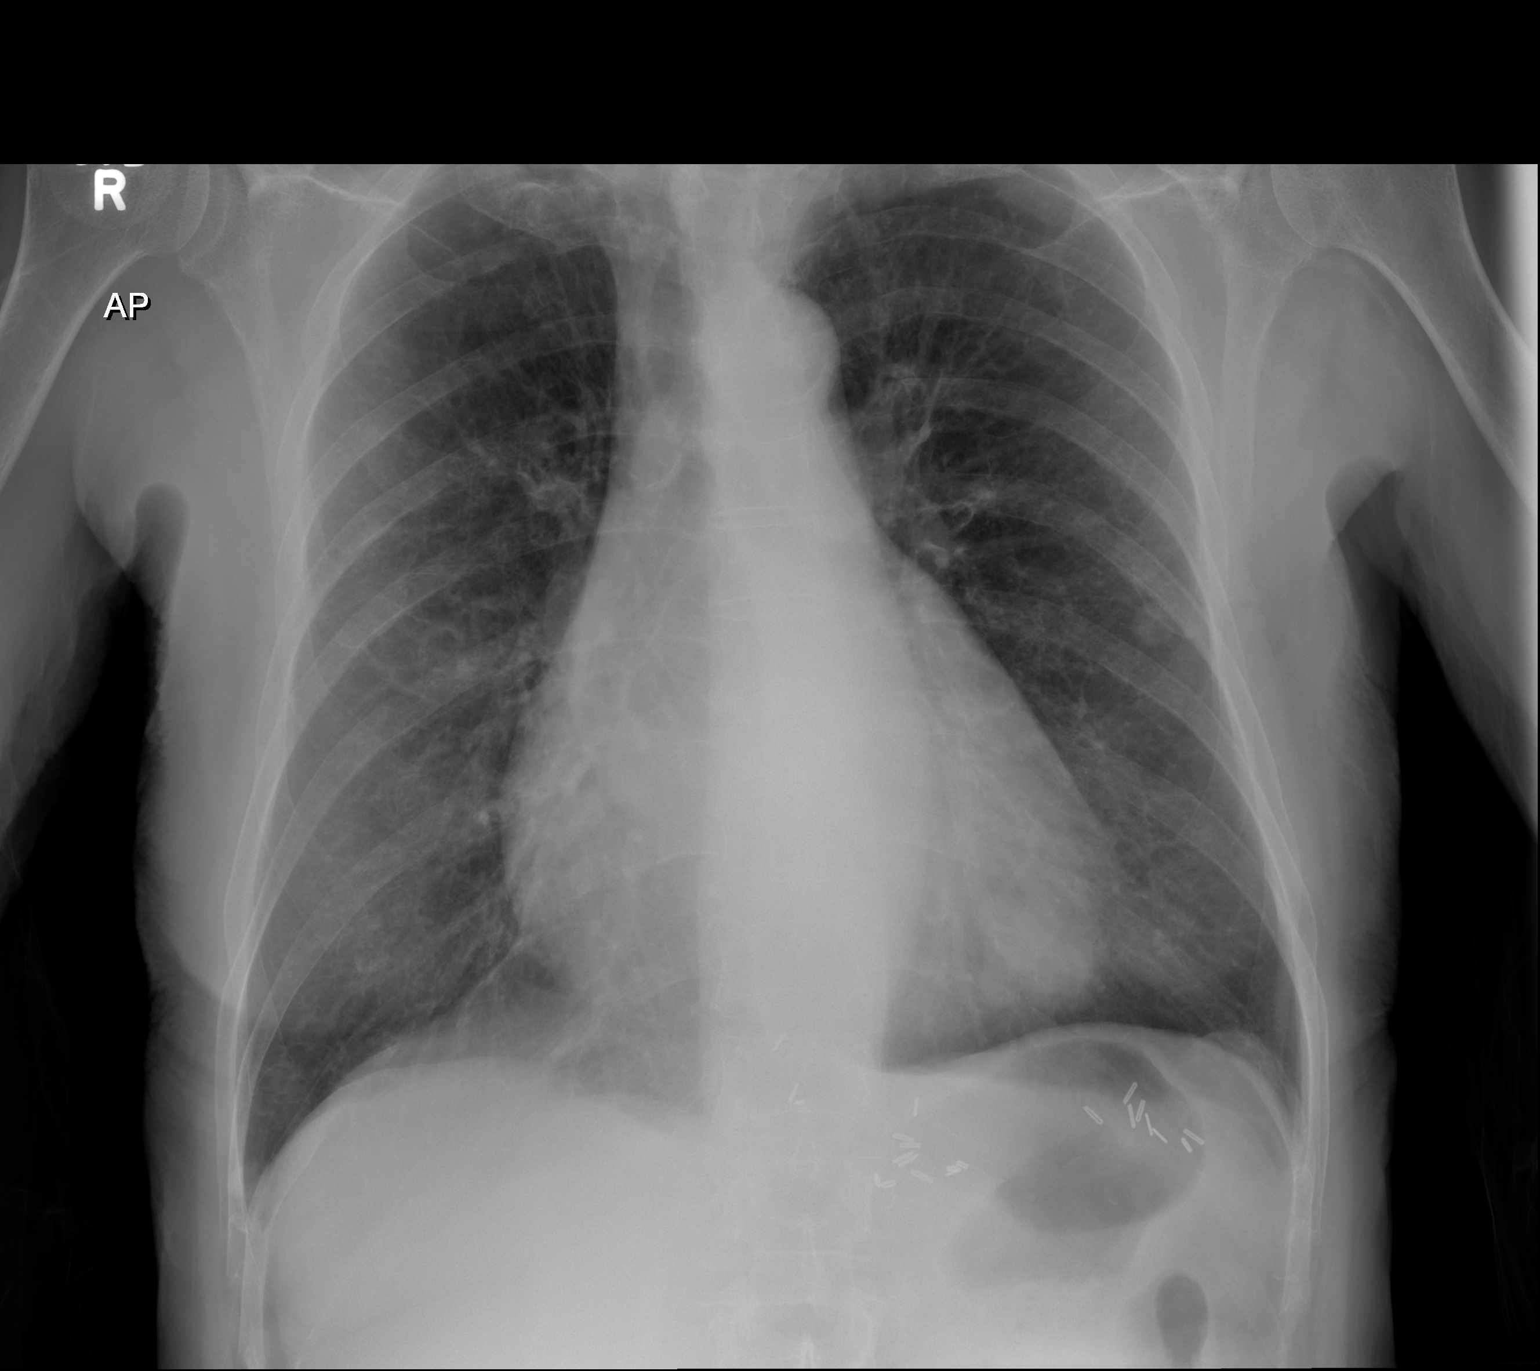

[2 of 2 positions shown; findings below may reference images not displayed]

FINDINGS: No acute airspace disease or pleural effusion. Mild cardiomegaly.
Lobulated left lower lobe lung mass. Left upper lobe pulmonary
nodule, no change. Aortic atherosclerosis. No pneumothorax.
Emphysematous disease
IMPRESSION: No active cardiopulmonary disease. Cardiomegaly. Left lower lobe
lobulated lung mass corresponding to suspicious mass on PET-CT.
Stable left upper lobe pulmonary nodule. Emphysematous disease

## 2020-05-07 DIAGNOSIS — D72829 Elevated white blood cell count, unspecified: Secondary | ICD-10-CM | POA: Diagnosis not present

## 2020-05-07 DIAGNOSIS — Z7901 Long term (current) use of anticoagulants: Secondary | ICD-10-CM | POA: Diagnosis not present

## 2020-05-07 DIAGNOSIS — R7301 Impaired fasting glucose: Secondary | ICD-10-CM | POA: Diagnosis not present

## 2020-05-07 DIAGNOSIS — Z712 Person consulting for explanation of examination or test findings: Secondary | ICD-10-CM | POA: Diagnosis not present

## 2020-05-07 DIAGNOSIS — R32 Unspecified urinary incontinence: Secondary | ICD-10-CM | POA: Diagnosis not present

## 2020-05-07 DIAGNOSIS — I10 Essential (primary) hypertension: Secondary | ICD-10-CM | POA: Diagnosis not present

## 2020-05-07 DIAGNOSIS — I482 Chronic atrial fibrillation, unspecified: Secondary | ICD-10-CM | POA: Diagnosis not present

## 2020-05-07 DIAGNOSIS — F411 Generalized anxiety disorder: Secondary | ICD-10-CM | POA: Diagnosis not present

## 2020-05-07 DIAGNOSIS — D509 Iron deficiency anemia, unspecified: Secondary | ICD-10-CM | POA: Diagnosis not present

## 2020-05-08 ENCOUNTER — Other Ambulatory Visit: Payer: Medicare Other

## 2020-05-08 ENCOUNTER — Ambulatory Visit: Payer: Medicare Other | Admitting: Thoracic Surgery (Cardiothoracic Vascular Surgery)

## 2020-05-24 DIAGNOSIS — S92424A Nondisplaced fracture of distal phalanx of right great toe, initial encounter for closed fracture: Secondary | ICD-10-CM | POA: Diagnosis not present

## 2020-05-24 DIAGNOSIS — M79671 Pain in right foot: Secondary | ICD-10-CM | POA: Diagnosis not present

## 2020-06-18 DIAGNOSIS — M21611 Bunion of right foot: Secondary | ICD-10-CM | POA: Diagnosis not present

## 2020-06-18 DIAGNOSIS — S92424D Nondisplaced fracture of distal phalanx of right great toe, subsequent encounter for fracture with routine healing: Secondary | ICD-10-CM | POA: Diagnosis not present

## 2020-06-18 DIAGNOSIS — M21619 Bunion of unspecified foot: Secondary | ICD-10-CM | POA: Insufficient documentation

## 2020-06-18 DIAGNOSIS — S92413A Displaced fracture of proximal phalanx of unspecified great toe, initial encounter for closed fracture: Secondary | ICD-10-CM | POA: Insufficient documentation

## 2020-06-27 DIAGNOSIS — C44319 Basal cell carcinoma of skin of other parts of face: Secondary | ICD-10-CM | POA: Diagnosis not present

## 2020-06-27 DIAGNOSIS — X32XXXD Exposure to sunlight, subsequent encounter: Secondary | ICD-10-CM | POA: Diagnosis not present

## 2020-06-27 DIAGNOSIS — L570 Actinic keratosis: Secondary | ICD-10-CM | POA: Diagnosis not present

## 2020-06-27 DIAGNOSIS — L820 Inflamed seborrheic keratosis: Secondary | ICD-10-CM | POA: Diagnosis not present

## 2020-07-11 NOTE — Progress Notes (Deleted)
Cardiology Office Note   Date:  07/11/2020   ID:  [name], [name] [birth year], MRN [id number]  PCP:  Marissa Squibb, Turner  Cardiologist:   Marissa Breeding, Turner Referring:  ***  No chief complaint on file.     History of Present Illness: Marissa Turner is a 80 y.o. female who presents for follow up of permanent atrial fibrillation.  She previously saw Dr. Bronson Ing.   She was seen last year for preop clearance for knee surgery.  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    Patient here for preop clearance for right total knee arthroplasty with Dr. Wynelle Turner on July 25, 2019.  Patient denies any recent anginal or exertional symptoms, palpitations or arrhythmias although she is in atrial fibrillation, denies any orthostatic symptoms, PND or orthopnea, bleeding issues, claudication-like symptoms.  Does have significant right knee pain due to degenerative arthritis.  No DVT or PE-like symptoms.  No lower extremity edema noted.  Denies any recent acute illnesses, hospitalizations.   Past Medical History:  Diagnosis Date   Atrial fibrillation, chronic ([place]) 2004   left atrial thrombus in 2004; near syncope in 2008; echocardiogram in 2008-normal EF, mild left atrial enlargement, no valvular abnormalities, lipomatous hypertrophy; no aortic atheroma on TEE in 2004   Cancer ([place])    LYMPH NODE    and lung cancer   Chronic anticoagulation 2004   warfarin   COPD (chronic obstructive pulmonary disease) ([place])    denies   Depression    DJD (degenerative joint disease), lumbosacral    Also hip   Dysrhythmia    chronic AFib   Family history of adverse reaction to anesthesia    sister PONV   Fracture of wrist    Left   History of kidney stones    once   Hypotension    Panic attacks    Peptic ulcer disease    s/p  Billroth II   PONV (postoperative nausea and vomiting)    Tobacco abuse    40 pack years; quit attempt in 2012    Past Surgical History:  Procedure Laterality Date   ABDOMINAL HYSTERECTOMY     w/o oophorectomy  partial   [place]  2004   [place]   patient denies ever having undergone colonoscopy as of 2014   [place] II; splenectomy; incidental appendectomy   KNEE ARTHROSCOPY WITH MEDIAL MENISECTOMY Right 02/26/2015   Procedure: KNEE ARTHROSCOPY WITH MEDIAL MENISECTOMY;  Surgeon: Marissa Kava, Turner;  [facility]: [facility];  Service: Orthopedics;  Laterality: Right;   LYMPH NODE BIOPSY Left 09/15/2013   Procedure: LEFT NECK CERVICAL NODE BIOPSY;  Surgeon: Marissa Ran, Turner;  Location: [facility];  Service: General;  Laterality: Left;   PORT-A-CATH REMOVAL Right 07/12/2014   Procedure: REMOVAL PORT-A-CATH;  Surgeon: Marissa Signs Md, Turner;  Location: [facility];  Service: General;  Laterality: Right;   PORTACATH PLACEMENT Right 10/21/2013   Procedure: ATTEMPTED INSERTION PORT-A-CATH;  Surgeon: Marissa Ran, Turner;  Location: [facility];  Service: General;  Laterality: Right;   SPLENECTOMY, TOTAL     THORACOSCOPY Left 04/15/2018   VIDEO ASSISTED THORACOSCOPY (VATS)/LEFT LOWER LOBECTOMY (Left)   TONSILLECTOMY  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   TOTAL HIP ARTHROPLASTY Left 10/18/2018   Procedure: TOTAL HIP ARTHROPLASTY ANTERIOR APPROACH;  Surgeon: Marissa Arabian, Turner;  [facility];  Service: Orthopedics;  Laterality: Left;  11min   TOTAL KNEE ARTHROPLASTY Right 07/25/2019   Procedure: TOTAL KNEE ARTHROPLASTY;  Surgeon: Marissa Arabian, Turner;  [facility];  Service: Orthopedics;  Laterality: Right;  84min   VIDEO ASSISTED THORACOSCOPY (VATS)/ LOBECTOMY Left 04/15/2018   Procedure: VIDEO ASSISTED THORACOSCOPY (VATS)/LEFT LOWER LOBECTOMY;  Surgeon: Marissa Nakayama, Turner;  Location: [place];  Service:  Thoracic;  Laterality: Left;   WEDGE RESECTION Left 04/15/2018   Procedure: LEFT UPPER WEDGE RESECTION;  Surgeon: Marissa Nakayama, Turner;  [facility];  Service: Thoracic;  Laterality: Left;     Current Outpatient Medications  Medication Sig Dispense Refill   acetaminophen (TYLENOL) 650 MG CR tablet Take 1,300 mg by mouth every evening.      ALPRAZolam (XANAX) 1 MG tablet Take 1 tablet (1 mg total) by mouth 3 (three) times daily as needed for anxiety. (Patient taking differently: Take 1 mg by mouth at bedtime. ) 30 tablet 0   apixaban (ELIQUIS) 5 MG TABS tablet Take 1 tablet (5 mg total) by mouth 2 (two) times daily. 60 tablet 6   citalopram (CELEXA) 20 MG tablet Take 20 mg by mouth daily.     diltiazem (CARDIZEM CD) 240 MG 24 hr capsule Take 1 capsule (240 mg total) by mouth daily. 90 capsule 3   No current facility-administered medications for this visit.    Allergies:   Estrogens, Codeine, Penicillins, and Diphenhydramine hcl  Patient  ROS:  Please see the history of present illness.   Otherwise, review of systems are positive for {NONE DEFAULTED:18576}.   All other systems are reviewed and negative.    PHYSICAL EXAM: VS:  There were no vitals taken for this visit. , BMI There is no height or weight on file to calculate BMI. GENERAL:  Well appearing NECK:  No jugular venous distention, waveform within normal limits, carotid upstroke brisk and symmetric, no bruits, no thyromegaly LUNGS:  Clear to auscultation bilaterally CHEST:  Unremarkable HEART:  PMI not displaced or sustained,S1 and S2 within normal limits, no S3, no clicks, no rubs, *** murmurs, irregular  ABD:  Flat, positive bowel sounds normal in frequency in pitch, no bruits, no rebound, no guarding, no midline pulsatile mass, no hepatomegaly, no splenomegaly EXT:  2 plus pulses throughout, no edema, no cyanosis no clubbing   ***GENERAL:  Well appearing HEENT:  Pupils equal round and reactive, fundi not visualized,  oral mucosa unremarkable NECK:  No jugular venous distention, waveform within normal limits, carotid upstroke brisk and symmetric, no bruits, no thyromegaly LYMPHATICS:  No cervical, inguinal adenopathy LUNGS:  Clear to auscultation bilaterally BACK:  No CVA tenderness CHEST:  Unremarkable HEART:  PMI not displaced or sustained,S1 and S2 within normal limits, no S3, no S4, no clicks, no rubs, *** murmurs ABD:  Flat, positive bowel sounds normal in frequency in pitch, no bruits, no rebound, no guarding, no midline pulsatile mass, no hepatomegaly, no splenomegaly EXT:  2 plus pulses throughout, no edema, no cyanosis no clubbing SKIN:  No rashes no nodules NEURO:  Cranial nerves II through XII grossly intact, motor grossly intact throughout PSYCH:  Cognitively intact, oriented to person place and time    EKG:  EKG {ACTION; IS/IS EYC:14481856} ordered today. The ekg ordered today demonstrates ***   Recent Labs: 07/19/2019: ALT  24 07/26/2019: BUN 10; Creatinine, Ser 0.54; Potassium 3.3; Sodium 140 08/16/2019: Hemoglobin 12.1; Platelets 559    Lipid Panel    Component Value Date/Time   CHOL 179 06/30/2013 0840   TRIG 173 (H) 06/30/2013 0840   HDL 37 (L) 06/30/2013 0840   CHOLHDL 4.8 06/30/2013 0840   VLDL 35 06/30/2013 0840   LDLCALC 107 (H) 06/30/2013 0840      Wt Readings from Last 3 Encounters:  12/12/19 130 lb (59 kg)  11/08/19 140 lb 12.8 oz (63.9 kg)  09/05/19 136 lb (61.7 kg)      Other studies Reviewed: Additional studies/ records that were reviewed today include: ***. Review of the above records demonstrates:  Please see elsewhere in the note.  ***   ASSESSMENT AND PLAN:    Permanent atrial fibrillation [facility]): *** EKG today shows atrial fibrillation with a rate of 74.  Continue Cardizem CD 240 mg daily and Eliquis 5 mg p.o. twice daily.   Essential hypertension: ***  Blood pressure well controlled on current medications.  Blood pressure today 110/70.  Continue  Cardizem 240 mg   Abnormal nuclear stress test?  *** Low risk stress test. Results may have been affected by adjacent GI uptake of trace. No angina, EF normal 55 to 65%.  See results above     Current medicines are reviewed at length with the patient today.  The patient {ACTIONS; HAS/DOES NOT HAVE:19233} concerns regarding medicines.  The following changes have been made:  {PLAN; NO CHANGE:13088:s}  Labs/ tests ordered today include: *** No orders of the defined types were placed in this encounter.    Disposition:   FU with ***    Signed, Marissa Breeding, Turner  07/11/2020 9:22 PM    [facility]

## 2020-07-12 ENCOUNTER — Ambulatory Visit: Payer: Medicare Other | Admitting: Cardiology

## 2020-07-13 ENCOUNTER — Encounter: Payer: Self-pay | Admitting: Cardiology

## 2020-07-28 ENCOUNTER — Other Ambulatory Visit: Payer: Self-pay | Admitting: Family Medicine

## 2020-07-31 NOTE — Telephone Encounter (Signed)
Eliquis 5mg  refill request received. Patient is 80 years old, weight-59kg, Crea-0.54 on 07/26/2019, Diagnosis-Afib, and last seen by Levell July on 07/13/2019-NEEDS APPT, PT NO SHOW on 07/12/20-WILL NEED TO CALL PT TO HAVE HER CALL TO OBTAIN APPT. Dose is appropriate based on dosing criteria.

## 2020-08-01 NOTE — Telephone Encounter (Signed)
Prescription refill request for Eliquis received. Indication: Afib Last office visit: 07/13/19  Quinn,A Scr: 0.65 on 05/04/20 Age: 80 Weight: 59kg  Based on above findings Eliquis 5mg  twice daily is the appropriate dose.  Refill approved.  Pt is past due to MD follow up,  Message sent to schedulers to schedule pt appt.

## 2020-08-08 DIAGNOSIS — Z08 Encounter for follow-up examination after completed treatment for malignant neoplasm: Secondary | ICD-10-CM | POA: Diagnosis not present

## 2020-08-08 DIAGNOSIS — Z85828 Personal history of other malignant neoplasm of skin: Secondary | ICD-10-CM | POA: Diagnosis not present

## 2020-08-08 DIAGNOSIS — L728 Other follicular cysts of the skin and subcutaneous tissue: Secondary | ICD-10-CM | POA: Diagnosis not present

## 2020-08-08 DIAGNOSIS — L720 Epidermal cyst: Secondary | ICD-10-CM | POA: Diagnosis not present

## 2020-08-30 DIAGNOSIS — Z96651 Presence of right artificial knee joint: Secondary | ICD-10-CM | POA: Diagnosis not present

## 2020-08-30 NOTE — Progress Notes (Signed)
Cardiology Office Note  Date: 08/31/2020   ID: Dimitria, Ketchum 1940/11/13, MRN [id number]  PCP:  Celene Squibb, MD  Cardiologist:  None Electrophysiologist:  None   Chief Complaint: 1 year follow up.   History of Present Illness: Marissa Turner is a 80 y.o. 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    She was last here for preop clearance for right total knee arthroplasty with Dr. Wynelle Link on July 25, 2019.  She denied any recent anginal or exertional symptoms, palpitations or arrhythmias although she was in atrial fibrillation, denies any orthostatic symptoms, PND or orthopnea, bleeding issues, claudication-like symptoms.  Was having significant right knee pain due to degenerative arthritis.  No DVT or PE-like symptoms.  No lower extremity edema noted.  Denied any recent acute illnesses, hospitalizations.  She is here for 1 year follow-up today.  She denies any issues in the interim since last visit.  She states she is doing well since her total knee replacement.  She had a fractured ankle which has healed in the interim also.  She states she is very active at home canning vegetables and being active with her great grandkids.  Blood pressure is very well controlled.  She denies any palpitations or arrhythmias.  EKG today shows atrial fibrillation with slow ventricular response with a rate of 58.  She denies any orthostatic symptoms, CVA or TIA-like symptoms, PND, orthopnea, DOE or SOB.  Denies any bleeding issues.  No claudication-like symptoms, DVT or PE-like symptoms, lower extremity edema.  States she has a follow-up soon with Dr. [place] Crane and states she will have them send her labs to [place].  Past Medical History:  Diagnosis Date   Atrial fibrillation, chronic ([place]) 2004   left  atrial thrombus in 2004; near syncope in 2008; echocardiogram in 2008-normal EF, mild left atrial enlargement, no valvular abnormalities, lipomatous hypertrophy; no aortic atheroma on TEE in 2004   Cancer ([place])    LYMPH NODE    and lung cancer   Chronic anticoagulation 2004   warfarin   COPD (chronic obstructive pulmonary disease) ([place])    denies   Depression    DJD (degenerative joint disease), lumbosacral    Also hip   Dysrhythmia    chronic AFib   Family history of adverse reaction to anesthesia    sister PONV   Fracture of wrist    Left   History of kidney stones    once   Hypotension    Panic attacks    Peptic ulcer disease    s/p Billroth II   PONV (postoperative nausea and vomiting)    Tobacco abuse    40 pack years; quit attempt in 2012    Past Surgical History:  Procedure Laterality Date   ABDOMINAL HYSTERECTOMY     w/o oophorectomy  partial   [place]  2004   [place]   patient denies ever having undergone colonoscopy as of 2014   [place] II; splenectomy; incidental appendectomy   KNEE ARTHROSCOPY WITH MEDIAL MENISECTOMY Right 02/26/2015   Procedure: KNEE ARTHROSCOPY WITH MEDIAL MENISECTOMY;  Surgeon: Sanjuana Kava, MD;  Location: [facility];  Service: Orthopedics;  Laterality: Right;  LYMPH NODE BIOPSY Left 09/15/2013   Procedure: LEFT NECK CERVICAL NODE BIOPSY;  Surgeon: Scherry Ran, MD;  Location: [facility];  Service: General;  Laterality: Left;   PORT-A-CATH REMOVAL Right 07/12/2014   Procedure: REMOVAL PORT-A-CATH;  Surgeon: Aviva Signs Md, MD;  Location: [facility];  Service: General;  Laterality: Right;   PORTACATH PLACEMENT Right 10/21/2013   Procedure: ATTEMPTED INSERTION PORT-A-CATH;  Surgeon: Scherry Ran, MD;  Location: [facility];  Service: General;  Laterality: Right;   SPLENECTOMY, TOTAL     THORACOSCOPY Left 04/15/2018   VIDEO ASSISTED  THORACOSCOPY (VATS)/LEFT LOWER LOBECTOMY (Left)   TONSILLECTOMY     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   TOTAL HIP ARTHROPLASTY Left 10/18/2018   Procedure: TOTAL HIP ARTHROPLASTY ANTERIOR APPROACH;  Surgeon: Gaynelle Arabian, MD;  Location: [facility];  Service: Orthopedics;  Laterality: Left;  140min   TOTAL KNEE ARTHROPLASTY Right 07/25/2019   Procedure: TOTAL KNEE ARTHROPLASTY;  Surgeon: Gaynelle Arabian, MD;  Location: [facility];  Service: Orthopedics;  Laterality: Right;  14min   VIDEO ASSISTED THORACOSCOPY (VATS)/ LOBECTOMY Left 04/15/2018   Procedure: VIDEO ASSISTED THORACOSCOPY (VATS)/LEFT LOWER LOBECTOMY;  Surgeon: Melrose Nakayama, MD;  Location: [facility];  Service: Thoracic;  Laterality: Left;   WEDGE RESECTION Left 04/15/2018   Procedure: LEFT UPPER WEDGE RESECTION;  Surgeon: Melrose Nakayama, MD;  Location: [facility];  Service: Thoracic;  Laterality: Left;    Current Outpatient Medications  Medication Sig Dispense Refill   acetaminophen (TYLENOL) 650 MG CR tablet Take 1,300 mg by mouth every evening.      ALPRAZolam (XANAX) 1 MG tablet Take 1 tablet (1 mg total) by mouth 3 (three) times daily as needed for anxiety. (Patient taking differently: Take 1 mg by mouth at bedtime.) 30 tablet 0   apixaban (ELIQUIS) 5 MG TABS tablet TAKE ONE TABLET (5MG  TOTAL) BY MOUTH TWOTIMES DAILY 60 tablet 3   citalopram (CELEXA) 20 MG tablet Take 20 mg by mouth daily.     diltiazem (CARDIZEM CD) 240 MG 24 hr capsule Take 240 mg by mouth daily.     No current facility-administered medications for this visit.   Allergies:  Estrogens, Codeine, Penicillins, and Diphenhydramine hcl   Social History: The patient  reports that she has quit smoking. Her smoking use included cigarettes. She has a 27.50 pack-year smoking history. She has never used smokeless tobacco. She reports that she does not drink alcohol and does not use drugs.   Family History: The patient's family history includes Breast cancer in her sister and  sister; Cancer in her brother; Cirrhosis in her sister; Heart attack in her brother and mother.   ROS:  Please see the history of present illness. Otherwise, complete review of systems is positive for none.  All other systems are reviewed and negative.   Physical Exam: VS:  BP 110/70   Pulse 60   Ht 5' (1.524 m)   Wt 146 lb 9.6 oz (66.5 kg)   SpO2 94%   BMI 28.63 kg/m , BMI Body mass index is 28.63 kg/m.  Wt Readings from Last 3 Encounters:  08/31/20 146 lb 9.6 oz (66.5 kg)  12/12/19 130 lb (59 kg)  11/08/19 140 lb 12.8 oz (63.9 kg)    General: Patient appears comfortable at rest. Neck: Supple, no elevated JVP or carotid bruits, no thyromegaly. Lungs: Clear to auscultation, nonlabored breathing at rest. Cardiac: Irregularly irregular rate and rhythm, no S3 or significant systolic murmur, no pericardial rub. Extremities: No  pitting edema, distal pulses 2+. Skin: Warm and dry. Musculoskeletal: No kyphosis. Neuropsychiatric: Alert and oriented x3, affect grossly appropriate.  ECG: 08/31/2020 atrial fibrillation with slow ventricular response rate of 58  Recent Labwork: No results found for requested labs within last 8760 hours.     Component Value Date/Time   CHOL 179 06/30/2013 0840   TRIG 173 (H) 06/30/2013 0840   HDL 37 (L) 06/30/2013 0840   CHOLHDL 4.8 06/30/2013 0840   VLDL 35 06/30/2013 0840   LDLCALC 107 (H) 06/30/2013 0840    Other Studies Reviewed Today:  Nuclear stress test 01/12/2018:   There was no ST segment deviation noted during stress. This is a low risk study. The left ventricular ejection fraction is normal (55-65%). 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 This area may be affected by artifact due to adjacent gut radiotracer uptake. Overall findings are low risk  Assessment and Plan:  1. Permanent atrial fibrillation ([place])   2. Essential hypertension     1. Permanent atrial fibrillation  ([facility]) EKG today atrial fibrillation with slow ventricular response rate of 58..  Continue Cardizem CD 240 mg daily and Eliquis 5 mg p.o. twice daily.  2. Essential hypertension Blood pressure well controlled on current medications.  Blood pressure today 110/70.  Continue Cardizem 240 mg     Medication Adjustments/Labs and Tests Ordered: Current medicines are reviewed at length with the patient today.  Concerns regarding medicines are outlined above.   Disposition: Follow-up with Dr. Harl Bowie or [facility] 1 year  Signed, Levell July, NP 08/31/2020 11:16 AM    [facility] [place], [place], [place] [address] Phone: [phone]; Fax: [phone]

## 2020-08-31 ENCOUNTER — Encounter: Payer: Self-pay | Admitting: Family Medicine

## 2020-08-31 ENCOUNTER — Other Ambulatory Visit: Payer: Self-pay

## 2020-08-31 ENCOUNTER — Ambulatory Visit (INDEPENDENT_AMBULATORY_CARE_PROVIDER_SITE_OTHER): Payer: Medicare Other | Admitting: Family Medicine

## 2020-08-31 VITALS — BP 110/70 | HR 60 | Ht 60.0 in | Wt 146.6 lb

## 2020-08-31 DIAGNOSIS — R9439 Abnormal result of other cardiovascular function study: Secondary | ICD-10-CM

## 2020-08-31 DIAGNOSIS — I10 Essential (primary) hypertension: Secondary | ICD-10-CM

## 2020-08-31 DIAGNOSIS — I4821 Permanent atrial fibrillation: Secondary | ICD-10-CM

## 2020-08-31 NOTE — Patient Instructions (Addendum)

## 2020-09-22 ENCOUNTER — Encounter (HOSPITAL_COMMUNITY): Payer: Self-pay

## 2020-09-22 ENCOUNTER — Emergency Department (HOSPITAL_COMMUNITY): Payer: No Typology Code available for payment source

## 2020-09-22 ENCOUNTER — Other Ambulatory Visit: Payer: Self-pay

## 2020-09-22 ENCOUNTER — Emergency Department (HOSPITAL_COMMUNITY)
Admission: EM | Admit: 2020-09-22 | Discharge: 2020-09-22 | Disposition: A | Discharge status: Home / Self Care | Payer: No Typology Code available for payment source | Attending: Emergency Medicine | Admitting: Emergency Medicine

## 2020-09-22 DIAGNOSIS — R079 Chest pain, unspecified: Secondary | ICD-10-CM | POA: Insufficient documentation

## 2020-09-22 DIAGNOSIS — I517 Cardiomegaly: Secondary | ICD-10-CM | POA: Diagnosis not present

## 2020-09-22 DIAGNOSIS — Z87891 Personal history of nicotine dependence: Secondary | ICD-10-CM | POA: Diagnosis not present

## 2020-09-22 DIAGNOSIS — Z85118 Personal history of other malignant neoplasm of bronchus and lung: Secondary | ICD-10-CM | POA: Insufficient documentation

## 2020-09-22 DIAGNOSIS — M25561 Pain in right knee: Secondary | ICD-10-CM | POA: Diagnosis not present

## 2020-09-22 DIAGNOSIS — I10 Essential (primary) hypertension: Secondary | ICD-10-CM | POA: Diagnosis not present

## 2020-09-22 DIAGNOSIS — M47816 Spondylosis without myelopathy or radiculopathy, lumbar region: Secondary | ICD-10-CM | POA: Diagnosis not present

## 2020-09-22 DIAGNOSIS — Y92410 Unspecified street and highway as the place of occurrence of the external cause: Secondary | ICD-10-CM | POA: Insufficient documentation

## 2020-09-22 DIAGNOSIS — J449 Chronic obstructive pulmonary disease, unspecified: Secondary | ICD-10-CM | POA: Insufficient documentation

## 2020-09-22 DIAGNOSIS — Z96651 Presence of right artificial knee joint: Secondary | ICD-10-CM | POA: Diagnosis not present

## 2020-09-22 DIAGNOSIS — S3991XA Unspecified injury of abdomen, initial encounter: Secondary | ICD-10-CM | POA: Insufficient documentation

## 2020-09-22 DIAGNOSIS — N281 Cyst of kidney, acquired: Secondary | ICD-10-CM | POA: Diagnosis not present

## 2020-09-22 DIAGNOSIS — N3289 Other specified disorders of bladder: Secondary | ICD-10-CM | POA: Diagnosis not present

## 2020-09-22 DIAGNOSIS — Z7901 Long term (current) use of anticoagulants: Secondary | ICD-10-CM | POA: Insufficient documentation

## 2020-09-22 DIAGNOSIS — I4891 Unspecified atrial fibrillation: Secondary | ICD-10-CM | POA: Diagnosis not present

## 2020-09-22 DIAGNOSIS — I700 Atherosclerosis of aorta: Secondary | ICD-10-CM | POA: Diagnosis not present

## 2020-09-22 DIAGNOSIS — Z96642 Presence of left artificial hip joint: Secondary | ICD-10-CM | POA: Insufficient documentation

## 2020-09-22 DIAGNOSIS — T1490XA Injury, unspecified, initial encounter: Secondary | ICD-10-CM

## 2020-09-22 DIAGNOSIS — R9082 White matter disease, unspecified: Secondary | ICD-10-CM | POA: Diagnosis not present

## 2020-09-22 DIAGNOSIS — R102 Pelvic and perineal pain: Secondary | ICD-10-CM | POA: Diagnosis not present

## 2020-09-22 DIAGNOSIS — M50322 Other cervical disc degeneration at C5-C6 level: Secondary | ICD-10-CM | POA: Diagnosis not present

## 2020-09-22 DIAGNOSIS — J323 Chronic sphenoidal sinusitis: Secondary | ICD-10-CM | POA: Diagnosis not present

## 2020-09-22 DIAGNOSIS — Q8909 Congenital malformations of spleen: Secondary | ICD-10-CM | POA: Diagnosis not present

## 2020-09-22 DIAGNOSIS — S0990XA Unspecified injury of head, initial encounter: Secondary | ICD-10-CM | POA: Insufficient documentation

## 2020-09-22 DIAGNOSIS — J439 Emphysema, unspecified: Secondary | ICD-10-CM | POA: Diagnosis not present

## 2020-09-22 DIAGNOSIS — I2510 Atherosclerotic heart disease of native coronary artery without angina pectoris: Secondary | ICD-10-CM | POA: Diagnosis not present

## 2020-09-22 DIAGNOSIS — M4312 Spondylolisthesis, cervical region: Secondary | ICD-10-CM | POA: Diagnosis not present

## 2020-09-22 LAB — CBC WITH DIFFERENTIAL/PLATELET
Abs Immature Granulocytes: 0.06 10*3/uL (ref 0.00–0.07)
Basophils Absolute: 0.1 10*3/uL (ref 0.0–0.1)
Basophils Relative: 1 %
Eosinophils Absolute: 0.5 10*3/uL (ref 0.0–0.5)
Eosinophils Relative: 4 %
HCT: 42.9 % (ref 36.0–46.0)
Hemoglobin: 13.3 g/dL (ref 12.0–15.0)
Immature Granulocytes: 1 %
Lymphocytes Relative: 44 %
Lymphs Abs: 5 10*3/uL — ABNORMAL HIGH (ref 0.7–4.0)
MCH: 27.9 pg (ref 26.0–34.0)
MCHC: 31 g/dL (ref 30.0–36.0)
MCV: 89.9 fL (ref 80.0–100.0)
Monocytes Absolute: 1.2 10*3/uL — ABNORMAL HIGH (ref 0.1–1.0)
Monocytes Relative: 11 %
Neutro Abs: 4.4 10*3/uL (ref 1.7–7.7)
Neutrophils Relative %: 39 %
Platelets: 333 10*3/uL (ref 150–400)
RBC: 4.77 MIL/uL (ref 3.87–5.11)
RDW: 15.6 % — ABNORMAL HIGH (ref 11.5–15.5)
WBC: 11.3 10*3/uL — ABNORMAL HIGH (ref 4.0–10.5)
nRBC: 0 % (ref 0.0–0.2)

## 2020-09-22 LAB — COMPREHENSIVE METABOLIC PANEL
ALT: 20 U/L (ref 0–44)
AST: 25 U/L (ref 15–41)
Albumin: 4 g/dL (ref 3.5–5.0)
Alkaline Phosphatase: 88 U/L (ref 38–126)
Anion gap: 7 (ref 5–15)
BUN: 15 mg/dL (ref 8–23)
CO2: 28 mmol/L (ref 22–32)
Calcium: 8.3 mg/dL — ABNORMAL LOW (ref 8.9–10.3)
Chloride: 103 mmol/L (ref 98–111)
Creatinine, Ser: 0.59 mg/dL (ref 0.44–1.00)
GFR, Estimated: >60 mL/min (ref >60)
Glucose, Bld: 96 mg/dL (ref 70–99)
Potassium: 3.6 mmol/L (ref 3.5–5.1)
Sodium: 138 mmol/L (ref 135–145)
Total Bilirubin: 0.6 mg/dL (ref 0.3–1.2)
Total Protein: 7.3 g/dL (ref 6.5–8.1)

## 2020-09-22 LAB — TROPONIN I (HIGH SENSITIVITY)
Troponin I (High Sensitivity): 9 ng/L (ref <18)
Troponin I (High Sensitivity): 9 ng/L (ref <18)

## 2020-09-22 MED ORDER — IOHEXOL 350 MG/ML SOLN: 85.0000 mL | Freq: Once | INTRAVENOUS | Status: DC | PRN
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
## 2020-09-22 NOTE — ED Notes (Signed)
Pt gone to CT 
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
## 2020-09-26 DIAGNOSIS — I10 Essential (primary) hypertension: Secondary | ICD-10-CM | POA: Diagnosis not present

## 2020-09-26 DIAGNOSIS — E785 Hyperlipidemia, unspecified: Secondary | ICD-10-CM | POA: Diagnosis not present

## 2020-10-16 ENCOUNTER — Encounter (HOSPITAL_COMMUNITY): Payer: Self-pay | Admitting: Hematology

## 2020-10-17 IMAGING — RF DG HIP (WITH PELVIS) OPERATIVE*L*
1 series · 9 of 9 positions shown · non-contrast
Comparison: Left hip radiographs 03/06/2017.

CLINICAL DATA: Left hip total arthroplasty.

EXAM:
OPERATIVE LEFT HIP (WITH PELVIS IF PERFORMED) 9 VIEWS
TECHNIQUE: Fluoroscopic spot image(s) were submitted for interpretation
post-operatively.

[Series 1: unknown protocol · 0.20mm/px · 9 of 9 slices shown]
[im 1/9]
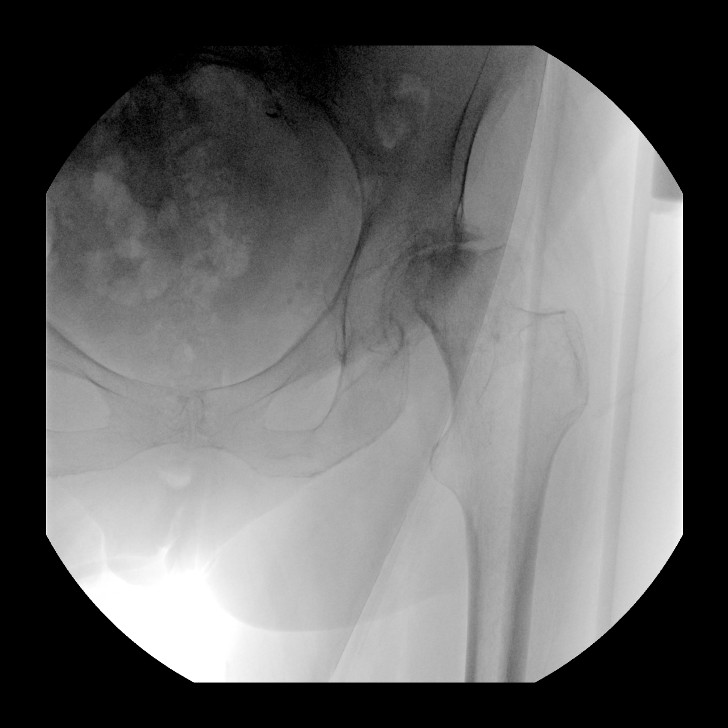
[im 2/9]
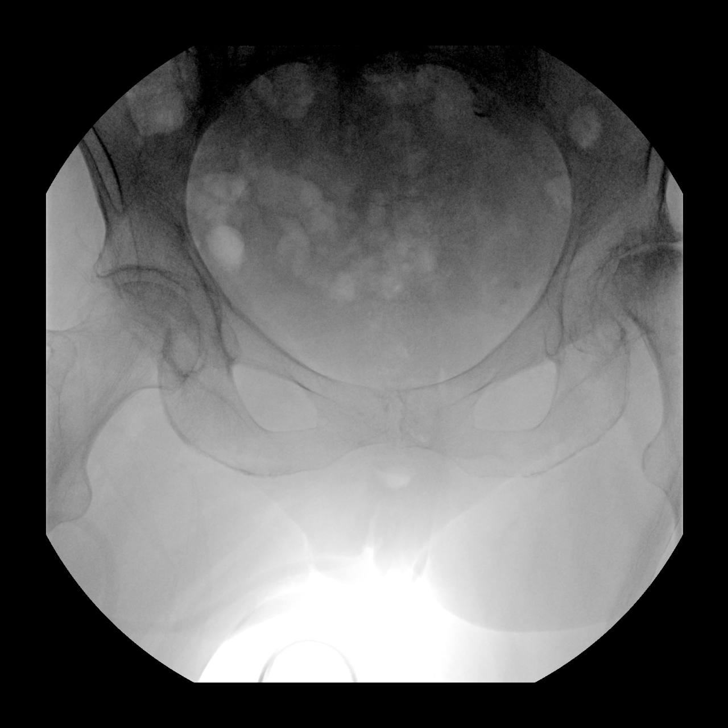
[im 3/9]
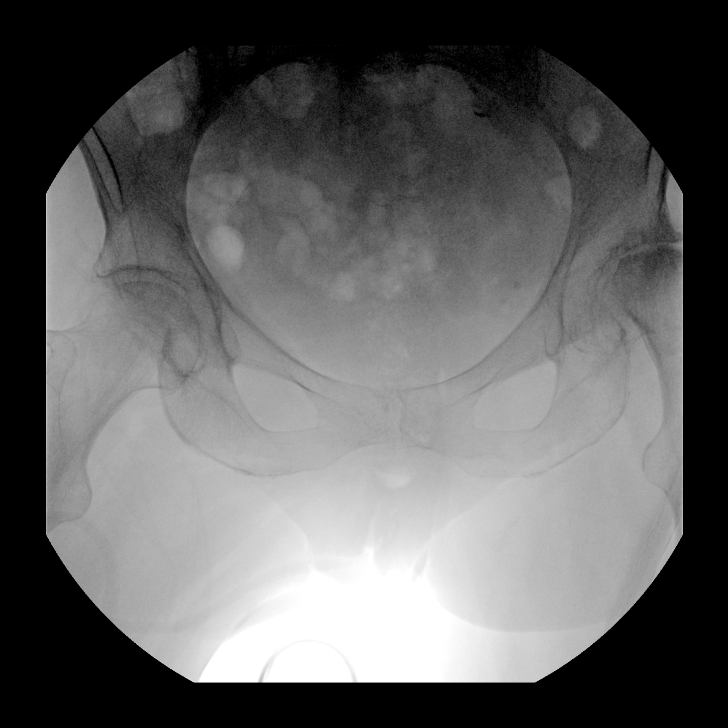
[im 4/9]
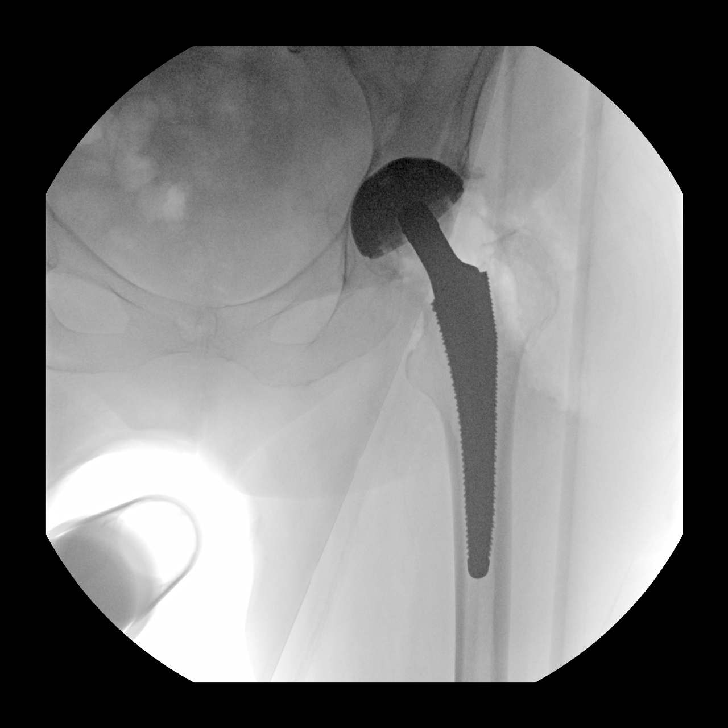
[im 5/9]
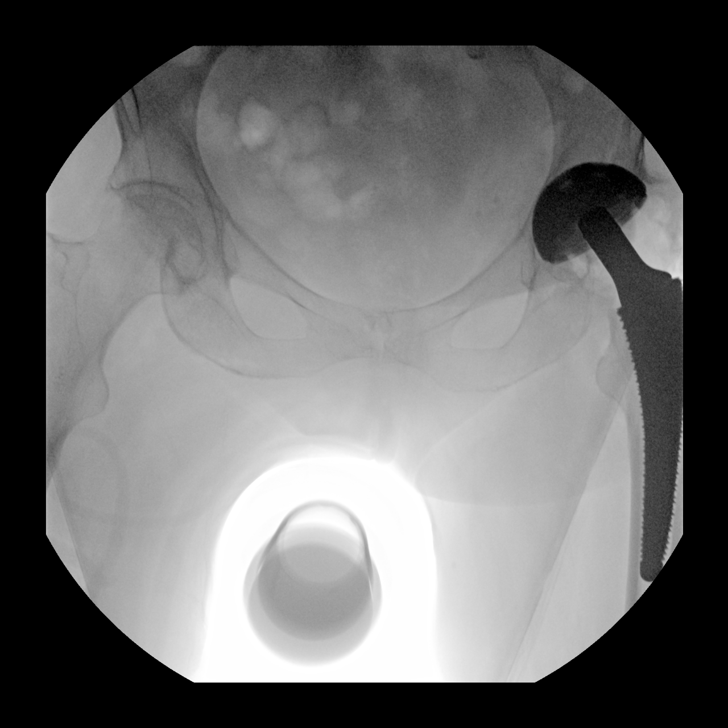
[im 6/9]
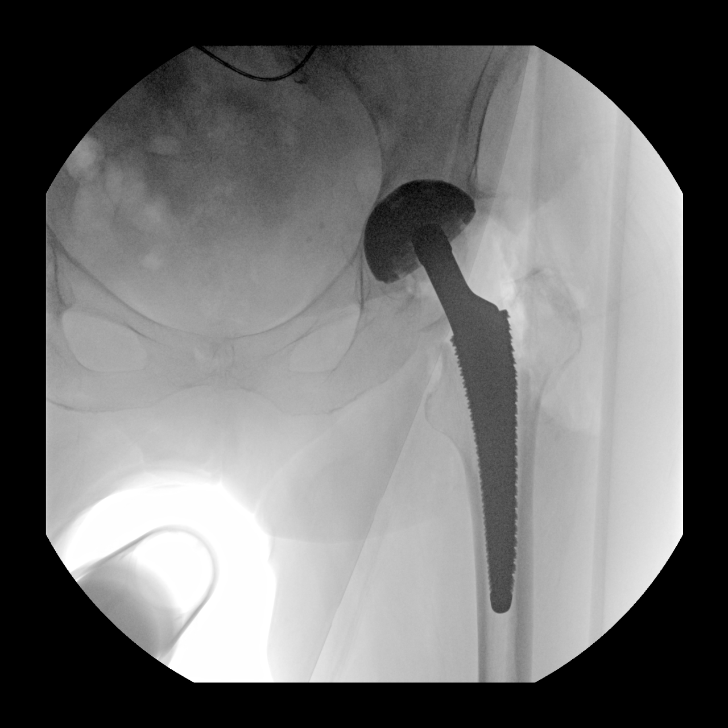
[im 7/9]
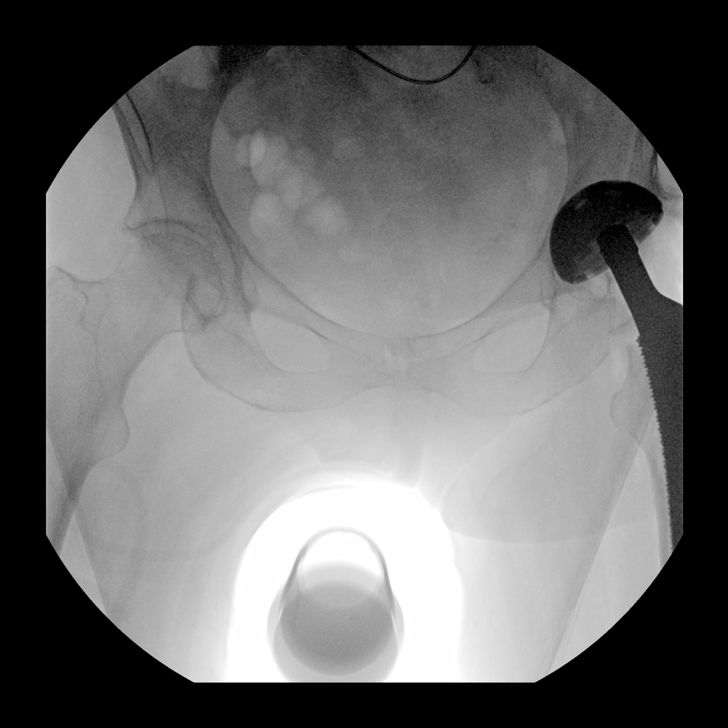
[im 8/9]
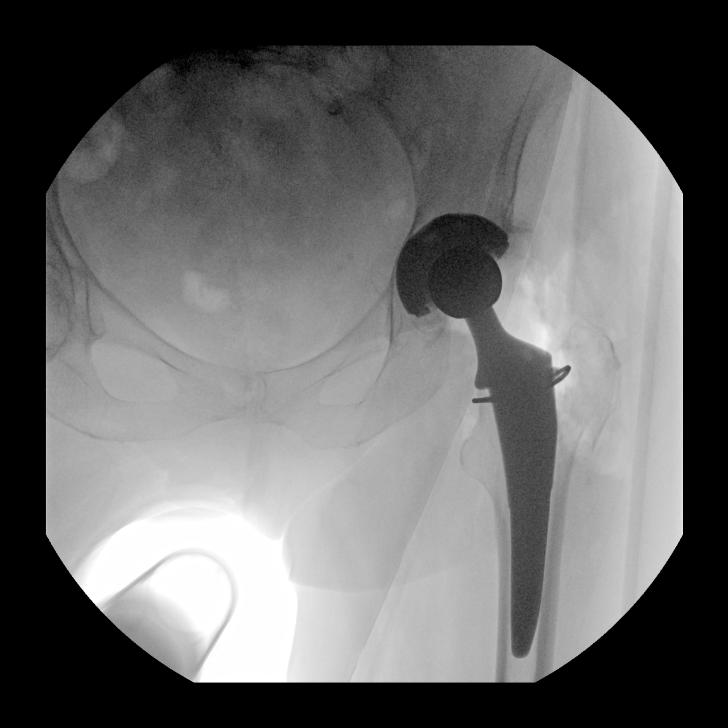
[im 9/9]
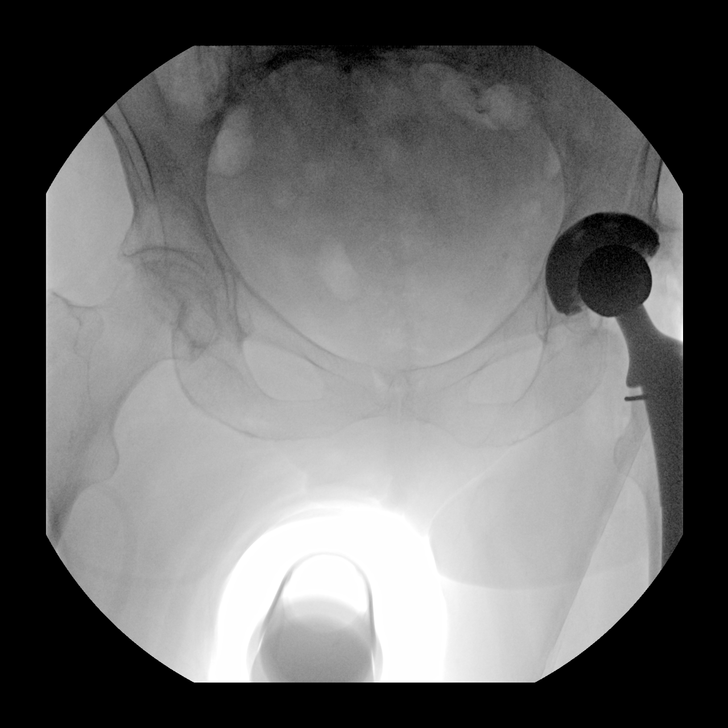

[9 of 9 positions shown; findings below may reference images not displayed]

FINDINGS: Initial spot images of the left hip demonstrate severe left hip
osteoarthritis with flattening of the femoral head, likely due to
chronic avascular necrosis. No gross abnormality of the right
femoral head identified. Subsequent images demonstrate the
sequential performance of a left total hip arthroplasty. On the
final images, the hardware is well positioned. There is a proximal
left femoral cerclage wire. No evidence of acute fracture or
dislocation. There is some gas in the soft tissue surrounding the
left hip.
IMPRESSION: Intraoperative views during left total hip arthroplasty. No
demonstrated complication.

## 2020-10-17 IMAGING — DX DG PORTABLE PELVIS
1 series · 1 of 1 positions shown · non-contrast
Comparison: Earlier same day

CLINICAL DATA: Postop left hip replacement

EXAM:
PORTABLE PELVIS 1-2 VIEWS

[pelvis ap]
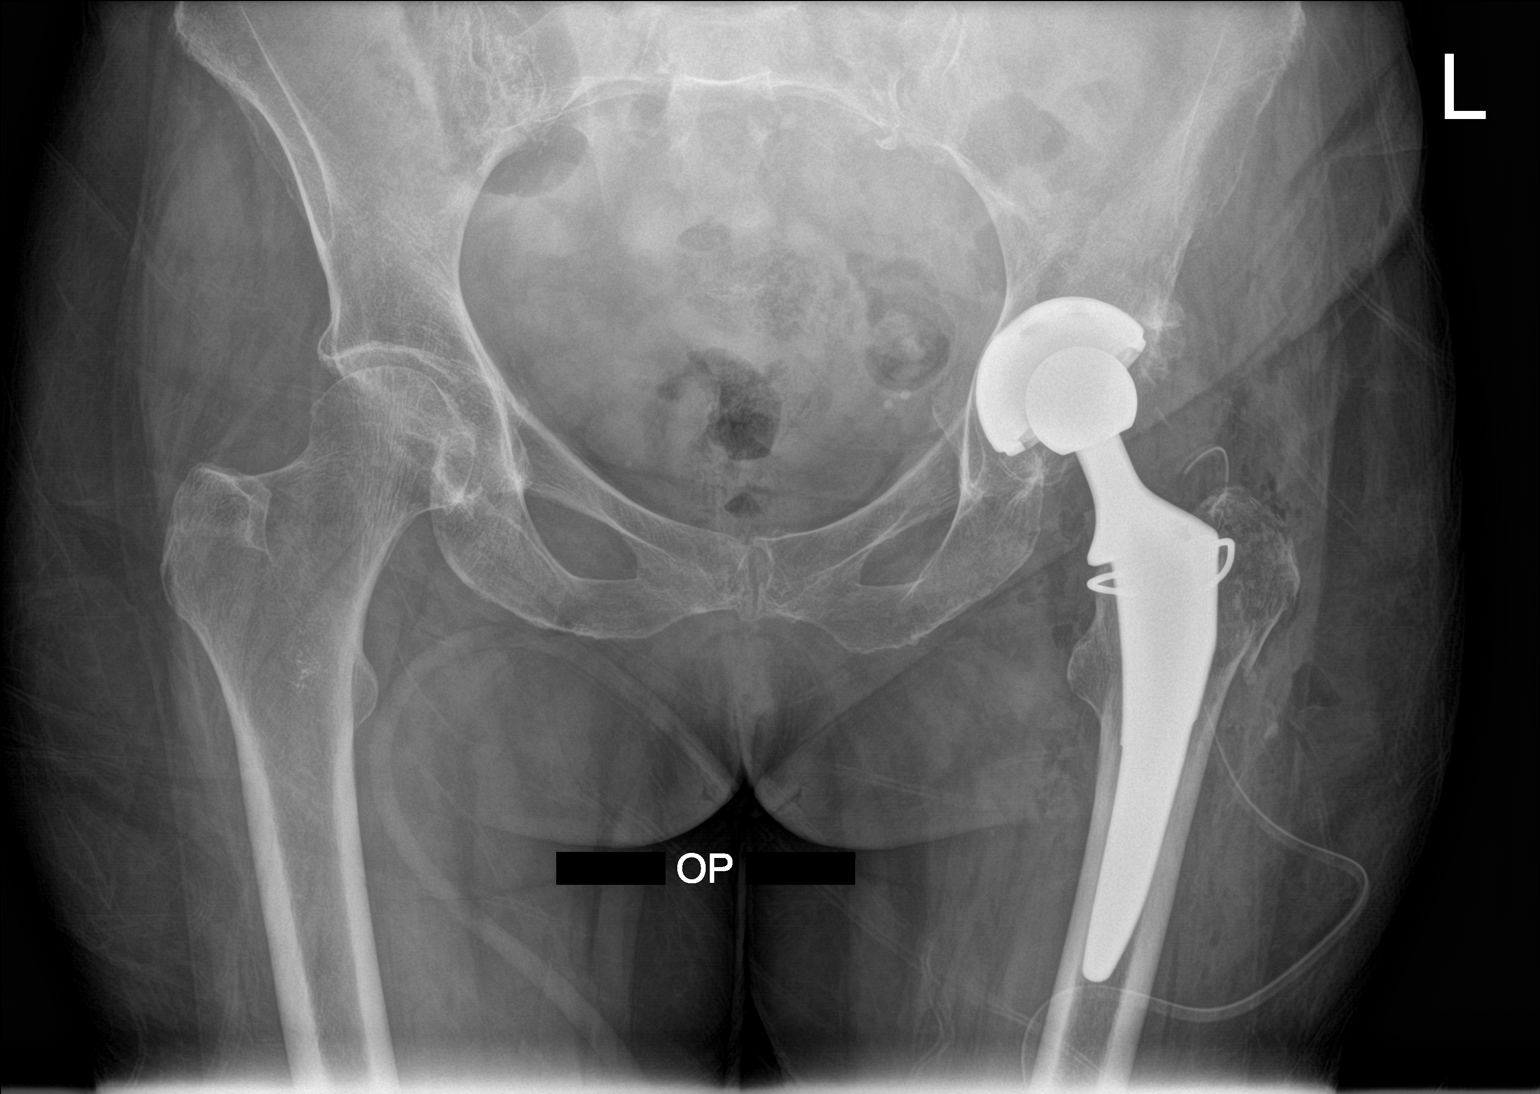

[1 of 1 positions shown; findings below may reference images not displayed]

FINDINGS: AP pelvis shows total hip replacement on the left. Fracture lines
are evident within the greater trochanter of the femur. Soft tissue
drain is in place.
IMPRESSION: Left total hip replacement. Fracture lines visible in the greater
trochanter of the femur. Soft tissue drain in place.

## 2020-10-26 DIAGNOSIS — E785 Hyperlipidemia, unspecified: Secondary | ICD-10-CM | POA: Diagnosis not present

## 2020-10-26 DIAGNOSIS — I10 Essential (primary) hypertension: Secondary | ICD-10-CM | POA: Diagnosis not present

## 2020-11-01 ENCOUNTER — Other Ambulatory Visit: Payer: Self-pay | Admitting: *Deleted

## 2020-11-01 ENCOUNTER — Telehealth: Payer: Self-pay | Admitting: *Deleted

## 2020-11-01 MED ORDER — APIXABAN 5 MG PO TABS: ORAL_TABLET | ORAL | 0 refills | Status: DC

## 2020-11-01 MED ORDER — APIXABAN 5 MG PO TABS: ORAL_TABLET | ORAL | 3 refills | Status: DC

## 2020-11-01 NOTE — Telephone Encounter (Signed)
Eliquis samples available for pick up

## 2020-11-01 NOTE — Telephone Encounter (Signed)
Awaiting patient assistance application from PCP office.

## 2020-11-01 NOTE — Telephone Encounter (Signed)
Pt called requesting Eliquis 5mg  samples. Indication: Atrial Fib Last office visit: 08/31/20  A Quinn FNP Scr: 0.59 on 09/22/20 Age: 80 Weight: 66.5kg  Based on above findings Eliquis 5mg  twice daily is the appropriate dose.  OK to give samples if available.

## 2020-11-08 DIAGNOSIS — I10 Essential (primary) hypertension: Secondary | ICD-10-CM | POA: Insufficient documentation

## 2020-11-08 DIAGNOSIS — F411 Generalized anxiety disorder: Secondary | ICD-10-CM | POA: Insufficient documentation

## 2020-11-08 DIAGNOSIS — G2581 Restless legs syndrome: Secondary | ICD-10-CM | POA: Insufficient documentation

## 2020-11-08 DIAGNOSIS — E782 Mixed hyperlipidemia: Secondary | ICD-10-CM | POA: Insufficient documentation

## 2020-11-09 DIAGNOSIS — I10 Essential (primary) hypertension: Secondary | ICD-10-CM | POA: Diagnosis not present

## 2020-11-09 DIAGNOSIS — E559 Vitamin D deficiency, unspecified: Secondary | ICD-10-CM | POA: Diagnosis not present

## 2020-11-09 DIAGNOSIS — R7301 Impaired fasting glucose: Secondary | ICD-10-CM | POA: Diagnosis not present

## 2020-11-12 DIAGNOSIS — Z23 Encounter for immunization: Secondary | ICD-10-CM | POA: Diagnosis not present

## 2020-11-12 DIAGNOSIS — I482 Chronic atrial fibrillation, unspecified: Secondary | ICD-10-CM | POA: Insufficient documentation

## 2020-11-12 DIAGNOSIS — R7301 Impaired fasting glucose: Secondary | ICD-10-CM | POA: Diagnosis not present

## 2020-11-12 DIAGNOSIS — E559 Vitamin D deficiency, unspecified: Secondary | ICD-10-CM | POA: Insufficient documentation

## 2020-11-12 DIAGNOSIS — E782 Mixed hyperlipidemia: Secondary | ICD-10-CM | POA: Diagnosis not present

## 2020-11-15 DIAGNOSIS — S60412A Abrasion of right middle finger, initial encounter: Secondary | ICD-10-CM | POA: Diagnosis not present

## 2020-11-15 DIAGNOSIS — R200 Anesthesia of skin: Secondary | ICD-10-CM | POA: Diagnosis not present

## 2020-11-15 DIAGNOSIS — S61202A Unspecified open wound of right middle finger without damage to nail, initial encounter: Secondary | ICD-10-CM | POA: Diagnosis not present

## 2020-11-15 DIAGNOSIS — S60031A Contusion of right middle finger without damage to nail, initial encounter: Secondary | ICD-10-CM | POA: Diagnosis not present

## 2020-11-20 DIAGNOSIS — S60031D Contusion of right middle finger without damage to nail, subsequent encounter: Secondary | ICD-10-CM | POA: Diagnosis not present

## 2020-11-20 DIAGNOSIS — S61202D Unspecified open wound of right middle finger without damage to nail, subsequent encounter: Secondary | ICD-10-CM | POA: Diagnosis not present

## 2020-11-22 DIAGNOSIS — M79675 Pain in left toe(s): Secondary | ICD-10-CM | POA: Diagnosis not present

## 2020-11-22 DIAGNOSIS — M79671 Pain in right foot: Secondary | ICD-10-CM | POA: Diagnosis not present

## 2020-11-22 DIAGNOSIS — I739 Peripheral vascular disease, unspecified: Secondary | ICD-10-CM | POA: Diagnosis not present

## 2020-11-22 DIAGNOSIS — M2011 Hallux valgus (acquired), right foot: Secondary | ICD-10-CM | POA: Diagnosis not present

## 2020-11-22 DIAGNOSIS — M79672 Pain in left foot: Secondary | ICD-10-CM | POA: Diagnosis not present

## 2020-11-22 DIAGNOSIS — L600 Ingrowing nail: Secondary | ICD-10-CM | POA: Diagnosis not present

## 2020-11-22 DIAGNOSIS — M79674 Pain in right toe(s): Secondary | ICD-10-CM | POA: Diagnosis not present

## 2020-11-22 DIAGNOSIS — L609 Nail disorder, unspecified: Secondary | ICD-10-CM | POA: Diagnosis not present

## 2020-11-26 DIAGNOSIS — I10 Essential (primary) hypertension: Secondary | ICD-10-CM | POA: Diagnosis not present

## 2020-11-26 DIAGNOSIS — E785 Hyperlipidemia, unspecified: Secondary | ICD-10-CM | POA: Diagnosis not present

## 2020-12-03 ENCOUNTER — Telehealth: Payer: Self-pay | Admitting: Family Medicine

## 2020-12-03 MED ORDER — APIXABAN 5 MG PO TABS: 5.0000 mg | ORAL_TABLET | Freq: Two times a day (BID) | ORAL | 0 refills | Status: DC
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
## 2020-12-03 NOTE — Telephone Encounter (Signed)
Samples provided for patient.

## 2020-12-03 NOTE — Telephone Encounter (Signed)
Patient calling the office for samples of medication:   1.  What medication and dosage are you requesting samples for? ELIQUIS 5 MG   2.  Are you currently out of this medication?   YES.  Patient is in the donut hole.

## 2020-12-15 ENCOUNTER — Other Ambulatory Visit: Payer: Self-pay

## 2020-12-15 ENCOUNTER — Ambulatory Visit
Admission: EM | Admit: 2020-12-15 | Discharge: 2020-12-15 | Disposition: A | Discharge status: Home / Self Care | Payer: Medicare Other | Attending: Physician Assistant | Admitting: Physician Assistant

## 2020-12-15 ENCOUNTER — Encounter: Payer: Self-pay | Admitting: Emergency Medicine

## 2020-12-15 DIAGNOSIS — H8113 Benign paroxysmal vertigo, bilateral: Secondary | ICD-10-CM | POA: Diagnosis not present

## 2020-12-15 MED ORDER — MECLIZINE HCL 12.5 MG PO TABS: 12.5000 mg | ORAL_TABLET | Freq: Three times a day (TID) | ORAL | 0 refills | Status: DC | PRN

## 2020-12-15 NOTE — ED Triage Notes (Signed)
Patient c/o RT ear pain x 1 week.   Patient endorses dizziness and multiple falls (" 3 times").   Patient endorses " ear popping" sensations.   Patient endorses SOB at times.   Patient denies hitting heads.   Patient is currently taking Eliquis.   Patient hasn't taken any medications for symptoms.

## 2020-12-15 NOTE — ED Provider Notes (Signed)
[facility]    CSN: [id number] Arrival date & time: 12/15/20  1257      History   Chief Complaint Chief Complaint  Patient presents with   Otalgia    HPI Marissa Turner is a 80 y.o. female.   Pt complains of intermittent dizziness that started about one week ago.  Reports she feels dizziness when going from sitting to standing or changing positions.  She denies chest pain, palpitations, shortness of breath, lower extremity swelling.  She reports about one week ago she experienced right ear popping and pain, the pain has since resolved.  She has taken nothing for the sx.    Past Medical History:  Diagnosis Date   Atrial fibrillation, chronic ([place]) 2004   left atrial thrombus in 2004; near syncope in 2008; echocardiogram in 2008-normal EF, mild left atrial enlargement, no valvular abnormalities, lipomatous hypertrophy; no aortic atheroma on TEE in 2004   Cancer ([place])    LYMPH NODE    and lung cancer   Chronic anticoagulation 2004   warfarin   COPD (chronic obstructive pulmonary disease) ([place])    denies   Depression    DJD (degenerative joint disease), lumbosacral    Also hip   Dysrhythmia    chronic AFib   Family history of adverse reaction to anesthesia    sister PONV   Fracture of wrist    Left   History of kidney stones    once   Hypotension    Panic attacks    Peptic ulcer disease    s/p Billroth II   PONV (postoperative nausea and vomiting)    Tobacco abuse    40 pack years; quit attempt in 2012    Patient Active Problem List   Diagnosis Date Noted   Primary osteoarthritis of right knee 07/25/2019   OA (osteoarthritis) of hip 10/18/2018   Squamous cell lung cancer, 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   Hamartoma of lung ([place]) 04/20/2018   S/P Left Lower Lobectomy, Wedge Resection LUL 04/15/2018   Positive colorectal cancer screening using Cologuard test 03/22/2018   Depression 07/25/2015   Knee pain, right 03/22/2015   Iron deficiency 10/17/2013    Lymphoma malignant, 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   Encounter for therapeutic drug monitoring 02/24/2013   OA (osteoarthritis) of knee    Atrial fibrillation, chronic (HCC)    Chronic anticoagulation    Tobacco abuse    Peptic ulcer disease    Hypertension    SPINAL STENOSIS 02/03/2008    Past Surgical History:  Procedure Laterality Date   ABDOMINAL HYSTERECTOMY     w/o oophorectomy  partial   APPENDECTOMY     CESAREAN SECTION     CHOLECYSTECTOMY  2004   [place]   patient denies ever having undergone colonoscopy as of 2014   [place] II; splenectomy; incidental appendectomy   KNEE ARTHROSCOPY WITH MEDIAL MENISECTOMY Right 02/26/2015   Procedure: KNEE ARTHROSCOPY WITH MEDIAL MENISECTOMY;  Surgeon: Sanjuana Kava, MD;  Location: [facility];  Service: Orthopedics;  Laterality: Right;   LYMPH NODE BIOPSY Left 09/15/2013   Procedure: LEFT NECK CERVICAL NODE BIOPSY;  Surgeon: Scherry Ran, MD;  Location: [facility];  Service: General;  Laterality: Left;   PORT-A-CATH REMOVAL Right 07/12/2014   Procedure: REMOVAL PORT-A-CATH;  Surgeon: Aviva Signs Md, MD;  Location: [facility];  Service: General;  Laterality: Right;   PORTACATH PLACEMENT Right 10/21/2013   Procedure: ATTEMPTED INSERTION PORT-A-CATH;  Surgeon: Scherry Ran, MD;  [facility]: [facility];  Service: General;  Laterality: Right;   SPLENECTOMY, TOTAL     THORACOSCOPY Left 04/15/2018   VIDEO ASSISTED THORACOSCOPY (VATS)/LEFT LOWER LOBECTOMY (Left)   TONSILLECTOMY     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   TOTAL HIP ARTHROPLASTY Left 10/18/2018   Procedure: TOTAL HIP ARTHROPLASTY ANTERIOR APPROACH;  Surgeon: Gaynelle Arabian, MD;  [facility];  Service: Orthopedics;  Laterality: Left;  115min   TOTAL KNEE ARTHROPLASTY Right 07/25/2019   Procedure: TOTAL KNEE ARTHROPLASTY;  Surgeon: Gaynelle Arabian, MD;  [facility];  Service: Orthopedics;  Laterality: Right;  23min   VIDEO ASSISTED  THORACOSCOPY (VATS)/ LOBECTOMY Left 04/15/2018   Procedure: VIDEO ASSISTED THORACOSCOPY (VATS)/LEFT LOWER LOBECTOMY;  Surgeon: Melrose Nakayama, MD;  Location: [place];  Service: Thoracic;  Laterality: Left;   WEDGE RESECTION Left 04/15/2018   Procedure: LEFT UPPER WEDGE RESECTION;  Surgeon: Melrose Nakayama, MD;  Location: [place];  Service: Thoracic;  Laterality: Left;    OB History   No obstetric history on file.      Home Medications    Prior to Admission medications   Medication Sig Start Date End Date Taking? Authorizing Provider  ALPRAZolam Duanne Moron) 1 MG tablet Take 1 tablet (1 mg total) by mouth 3 (three) times daily as needed for anxiety. Patient taking differently: Take 1 mg by mouth at bedtime. 09/04/15  Yes Kefalas, Manon Hilding, PA-C  apixaban (ELIQUIS) 5 MG TABS tablet Take 1 tablet (5 mg total) by mouth 2 (two) times daily. 12/03/20  Yes Verta Ellen., NP  citalopram (CELEXA) 20 MG tablet Take 20 mg by mouth daily.   Yes [provider]  diltiazem (CARDIZEM CD) 240 MG 24 hr capsule Take 240 mg by mouth daily.   Yes [provider]  meclizine (ANTIVERT) 12.5 MG tablet Take 1 tablet (12.5 mg total) by mouth 3 (three) times daily as needed for dizziness. 12/15/20  Yes Ward, Lenise Arena, PA-C  acetaminophen (TYLENOL) 650 MG CR tablet Take 1,300 mg by mouth every evening.     [provider]    Family History Family History  Problem Relation Age of Onset   Heart attack Mother        also brother   Breast cancer Sister    Heart attack Brother    Breast cancer Sister        x2   Cancer Brother        Gastric carcinoma; also father   Cirrhosis Sister     Social History Social History   Tobacco Use   Smoking status: Former    Packs/day: 0.50    Years: 55.00    Pack years: 27.50    Types: Cigarettes   Smokeless tobacco: Never   Tobacco comments:    quit quit in june  Vaping Use   Vaping Use: Never used  Substance Use Topics    Alcohol use: No   Drug use: No     Allergies   Estrogens, Codeine, Penicillins, and Diphenhydramine hcl   Review of Systems Review of Systems  Constitutional:  Negative for chills and fever.  HENT:  Negative for congestion, ear pain and sore throat.   Eyes:  Negative for pain and visual disturbance.  Respiratory:  Negative for cough, chest tightness and shortness of breath.   Cardiovascular:  Negative for chest pain and palpitations.  Gastrointestinal:  Negative for abdominal pain and vomiting.  Genitourinary:  Negative for  dysuria and hematuria.  Musculoskeletal:  Negative for arthralgias and back pain.  Skin:  Negative for color change and rash.  Neurological:  Positive for dizziness. Negative for seizures, syncope, weakness and headaches.  All other systems reviewed and are negative.   Physical Exam Triage Vital Signs ED Triage Vitals  Enc Vitals Group     BP 12/15/20 1358 (!) 154/93     Pulse Rate 12/15/20 1358 69     Resp 12/15/20 1358 19     Temp 12/15/20 1358 97.7 F (36.5 C)     Temp Source 12/15/20 1358 Oral     SpO2 12/15/20 1358 90 %     Weight --      Height --      Head Circumference --      Peak Flow --      Pain Score 12/15/20 1359 3     Pain Loc --      Pain Edu? --      Excl. in [place]? --    No data found.  Updated Vital Signs BP (!) 154/93 (BP Location: Right Arm)   Pulse 69   Temp 97.7 F (36.5 C) (Oral)   Resp 19   SpO2 90%   Visual Acuity Right Eye Distance:   Left Eye Distance:   Bilateral Distance:    Right Eye Near:   Left Eye Near:    Bilateral Near:     Physical Exam Vitals and nursing note reviewed.  Constitutional:      General: She is not in acute distress.    Appearance: She is well-developed.  HENT:     Head: Normocephalic and atraumatic.  Eyes:     Conjunctiva/sclera: Conjunctivae normal.  Cardiovascular:     Rate and Rhythm: Normal rate and regular rhythm.     Heart sounds: No murmur heard. Pulmonary:      Effort: Pulmonary effort is normal. No respiratory distress.     Breath sounds: Normal breath sounds.  Abdominal:     Palpations: Abdomen is soft.     Tenderness: There is no abdominal tenderness.  Musculoskeletal:        General: No swelling.     Cervical back: Neck supple.  Skin:    General: Skin is warm and dry.     Capillary Refill: Capillary refill takes less than 2 seconds.  Neurological:     Mental Status: She is alert.  Psychiatric:        Mood and Affect: Mood normal.     UC Treatments / Results  Labs (all labs ordered are listed, but only abnormal results are displayed) Labs Reviewed - No data to display  EKG   Radiology No results found.  Procedures Procedures (including critical care time)  Medications Ordered in UC Medications - No data to display  Initial Impression / Assessment and Plan / UC Course  I have reviewed the triage vital signs and the nursing notes.  Pertinent labs & imaging results that were available during my care of the patient were reviewed by me and considered in my medical decision making (see chart for details).     Vertigo. Sx are positional, neuro exam normal.  EKG unchanged, known a-fib.  Stable for discharge.  Return precautions discussed.  Final Clinical Impressions(s) / UC Diagnoses   Final diagnoses:  Benign paroxysmal positional vertigo due to bilateral vestibular disorder     Discharge Instructions      Follow up with Primary Care Physician If you develop  new symptoms return for evaluation   ED Prescriptions     Medication Sig Dispense Auth. Provider   meclizine (ANTIVERT) 12.5 MG tablet Take 1 tablet (12.5 mg total) by mouth 3 (three) times daily as needed for dizziness. 30 tablet Ward, Lenise Arena, PA-C      PDMP not reviewed this encounter.   Ward, Lenise Arena, PA-C 12/15/20 1439

## 2020-12-15 NOTE — Discharge Instructions (Signed)
Follow up with Primary Care Physician If you develop new symptoms return for evaluation

## 2020-12-26 DIAGNOSIS — E785 Hyperlipidemia, unspecified: Secondary | ICD-10-CM | POA: Diagnosis not present

## 2020-12-26 DIAGNOSIS — I10 Essential (primary) hypertension: Secondary | ICD-10-CM | POA: Diagnosis not present

## 2021-01-07 DIAGNOSIS — Z23 Encounter for immunization: Secondary | ICD-10-CM | POA: Diagnosis not present

## 2021-01-22 ENCOUNTER — Other Ambulatory Visit: Payer: Self-pay

## 2021-01-22 ENCOUNTER — Ambulatory Visit
Admission: EM | Admit: 2021-01-22 | Discharge: 2021-01-22 | Disposition: A | Discharge status: Home / Self Care | Payer: Medicare Other | Attending: Family Medicine | Admitting: Family Medicine

## 2021-01-22 ENCOUNTER — Ambulatory Visit (INDEPENDENT_AMBULATORY_CARE_PROVIDER_SITE_OTHER): Payer: Medicare Other

## 2021-01-22 DIAGNOSIS — S8002XA Contusion of left knee, initial encounter: Secondary | ICD-10-CM

## 2021-01-22 DIAGNOSIS — W19XXXA Unspecified fall, initial encounter: Secondary | ICD-10-CM | POA: Diagnosis not present

## 2021-01-22 DIAGNOSIS — M7989 Other specified soft tissue disorders: Secondary | ICD-10-CM | POA: Diagnosis not present

## 2021-01-22 DIAGNOSIS — S90122A Contusion of left lesser toe(s) without damage to nail, initial encounter: Secondary | ICD-10-CM | POA: Diagnosis not present

## 2021-01-22 DIAGNOSIS — M25562 Pain in left knee: Secondary | ICD-10-CM | POA: Diagnosis not present

## 2021-01-22 DIAGNOSIS — M79675 Pain in left toe(s): Secondary | ICD-10-CM

## 2021-01-22 DIAGNOSIS — Z043 Encounter for examination and observation following other accident: Secondary | ICD-10-CM | POA: Diagnosis not present

## 2021-01-22 DIAGNOSIS — M25462 Effusion, left knee: Secondary | ICD-10-CM | POA: Diagnosis not present

## 2021-01-22 NOTE — ED Triage Notes (Signed)
Patient states she was coming out of her room on Christmas eve and fell down.   She states she fell on her left knee and it is now swollen and her 3rd toe on the left foot may be broken.  She tried heat and ice and Tylenol without any relief.

## 2021-01-23 ENCOUNTER — Telehealth: Payer: Self-pay | Admitting: *Deleted

## 2021-01-23 MED ORDER — APIXABAN 5 MG PO TABS: 5.0000 mg | ORAL_TABLET | Freq: Two times a day (BID) | ORAL | 5 refills | Status: DC

## 2021-01-23 NOTE — Telephone Encounter (Signed)
Received fax from [facility] 5mg  - one tab twice a day

## 2021-01-23 NOTE — Telephone Encounter (Signed)
Prescription refill request for Eliquis received. Indication: Atrial Fib Last office visit: 08/31/20  A Quinn FNP Scr: 0.59 on 09/22/20 Age: 80 Weight: 66.5kg  Based on above findings Eliquis 5mg  twice daily is the appropriate dose.  Refill approved.

## 2021-01-26 NOTE — ED Provider Notes (Signed)
[facility]    CSN: [id number] Arrival date & time: 01/22/21  1006      History   Chief Complaint Chief Complaint  Patient presents with   [place] 3 days ago with pain in her left knee    HPI Marissa Turner is a 80 y.o. female.   Presenting today with left knee pain, swelling after tripping and falling onto the knee 3 days ago. Pain with movement and weight bearing but ROM intact, no numbness, tingling, discoloration. Also having bruising and pain, swelling 3rd toe left foot after dropping a can on it recently. Trying ice and tylenol with some relief.    Past Medical History:  Diagnosis Date   Atrial fibrillation, chronic ([place]) 2004   left atrial thrombus in 2004; near syncope in 2008; echocardiogram in 2008-normal EF, mild left atrial enlargement, no valvular abnormalities, lipomatous hypertrophy; no aortic atheroma on TEE in 2004   Cancer ([place])    LYMPH NODE    and lung cancer   Chronic anticoagulation 2004   warfarin   COPD (chronic obstructive pulmonary disease) ([place])    denies   Depression    DJD (degenerative joint disease), lumbosacral    Also hip   Dysrhythmia    chronic AFib   Family history of adverse reaction to anesthesia    sister PONV   Fracture of wrist    Left   History of kidney stones    once   Hypotension    Panic attacks    Peptic ulcer disease    s/p Billroth II   PONV (postoperative nausea and vomiting)    Tobacco abuse    40 pack years; quit attempt in 2012    Patient Active Problem List   Diagnosis Date Noted   Primary osteoarthritis of right knee 07/25/2019   OA (osteoarthritis) of hip 10/18/2018   Squamous cell lung cancer, 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/20/2018   Hamartoma of lung ([place]) 04/20/2018   S/P Left Lower Lobectomy, Wedge Resection LUL 04/15/2018   Positive colorectal cancer screening using Cologuard test 03/22/2018   Depression 07/25/2015   Knee pain, right 03/22/2015   Iron deficiency 10/17/2013   Lymphoma  malignant, large cell ([place]) 10/10/2013   Encounter for therapeutic drug monitoring 02/24/2013   OA (osteoarthritis) of knee    Atrial fibrillation, chronic (HCC)    Chronic anticoagulation    Tobacco abuse    Peptic ulcer disease    Hypertension    SPINAL STENOSIS 02/03/2008    Past Surgical History:  Procedure Laterality Date   ABDOMINAL HYSTERECTOMY     w/o oophorectomy  partial   APPENDECTOMY     CESAREAN SECTION     CHOLECYSTECTOMY  2004   [place]   patient denies ever having undergone colonoscopy as of 2014   [place] II; splenectomy; incidental appendectomy   KNEE ARTHROSCOPY WITH MEDIAL MENISECTOMY Right 02/26/2015   Procedure: KNEE ARTHROSCOPY WITH MEDIAL MENISECTOMY;  Surgeon: Sanjuana Kava, MD;  [facility];  Service: Orthopedics;  Laterality: Right;   LYMPH NODE BIOPSY Left 09/15/2013   Procedure: LEFT NECK CERVICAL NODE BIOPSY;  Surgeon: Scherry Ran, MD;  [facility];  Service: General;  Laterality: Left;   PORT-A-CATH REMOVAL Right 07/12/2014   Procedure: REMOVAL PORT-A-CATH;  Surgeon: Aviva Signs Md, MD;  [facility];  Service: General;  Laterality: Right;   PORTACATH PLACEMENT Right 10/21/2013   Procedure: ATTEMPTED  INSERTION PORT-A-CATH;  Surgeon: Scherry Ran, MD;  [facility];  Service: General;  Laterality: Right;   SPLENECTOMY, TOTAL     THORACOSCOPY Left 04/15/2018   VIDEO ASSISTED THORACOSCOPY (VATS)/LEFT LOWER LOBECTOMY (Left)   TONSILLECTOMY     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   TOTAL HIP ARTHROPLASTY Left 10/18/2018   Procedure: TOTAL HIP ARTHROPLASTY ANTERIOR APPROACH;  Surgeon: Gaynelle Arabian, MD;  [facility];  Service: Orthopedics;  Laterality: Left;  173min   TOTAL KNEE ARTHROPLASTY Right 07/25/2019   Procedure: TOTAL KNEE ARTHROPLASTY;  Surgeon: Gaynelle Arabian, MD;  [facility];  Service: Orthopedics;  Laterality: Right;  66min   VIDEO ASSISTED THORACOSCOPY  (VATS)/ LOBECTOMY Left 04/15/2018   Procedure: VIDEO ASSISTED THORACOSCOPY (VATS)/LEFT LOWER LOBECTOMY;  Surgeon: Melrose Nakayama, MD;  Location: [facility];  Service: Thoracic;  Laterality: Left;   WEDGE RESECTION Left 04/15/2018   Procedure: LEFT UPPER WEDGE RESECTION;  Surgeon: Melrose Nakayama, MD;  Location: [facility];  Service: Thoracic;  Laterality: Left;    OB History   No obstetric history on file.      Home Medications    Prior to Admission medications   Medication Sig Start Date End Date Taking? Authorizing Provider  acetaminophen (TYLENOL) 650 MG CR tablet Take 1,300 mg by mouth every evening.     [provider]  ALPRAZolam Duanne Moron) 1 MG tablet Take 1 tablet (1 mg total) by mouth 3 (three) times daily as needed for anxiety. Patient taking differently: Take 1 mg by mouth at bedtime. 09/04/15   Baird Cancer, PA-C  apixaban (ELIQUIS) 5 MG TABS tablet Take 1 tablet (5 mg total) by mouth 2 (two) times daily. 01/23/21   Satira Sark, MD  citalopram (CELEXA) 20 MG tablet Take 20 mg by mouth daily.    [provider]  diltiazem (CARDIZEM CD) 240 MG 24 hr capsule Take 240 mg by mouth daily.    [provider]  meclizine (ANTIVERT) 12.5 MG tablet Take 1 tablet (12.5 mg total) by mouth 3 (three) times daily as needed for dizziness. 12/15/20   Ward, Lenise Arena, PA-C    Family History Family History  Problem Relation Age of Onset   Heart attack Mother        also brother   Breast cancer Sister    Heart attack Brother    Breast cancer Sister        x2   Cancer Brother        Gastric carcinoma; also father   Cirrhosis Sister     Social History Social History   Tobacco Use   Smoking status: Former    Packs/day: 0.50    Years: 55.00    Pack years: 27.50    Types: Cigarettes   Smokeless tobacco: Never   Tobacco comments:    quit quit in june  Vaping Use   Vaping Use: Never used  Substance Use Topics   Alcohol use: No   Drug use: No      Allergies   Estrogens, Codeine, Penicillins, and Diphenhydramine hcl   Review of Systems Review of Systems PER HPI  Physical Exam Triage Vital Signs ED Triage Vitals  Enc Vitals Group     BP 01/22/21 1345 (!) 144/85     Pulse Rate 01/22/21 1345 77     Resp 01/22/21 1345 16     Temp 01/22/21 1345 97.6 F (36.4 C)     Temp Source 01/22/21 1345 Oral  SpO2 01/22/21 1345 92 %     Weight --      Height --      Head Circumference --      Peak Flow --      Pain Score 01/22/21 1342 10     Pain Loc --      Pain Edu? --      Excl. in [place]? --    No data found.  Updated Vital Signs BP (!) 144/85 (BP Location: Right Arm)    Pulse 77    Temp 97.6 F (36.4 C) (Oral)    Resp 16    SpO2 92%   Visual Acuity Right Eye Distance:   Left Eye Distance:   Bilateral Distance:    Right Eye Near:   Left Eye Near:    Bilateral Near:     Physical Exam Vitals and nursing note reviewed.  Constitutional:      Appearance: Normal appearance. She is not ill-appearing.  HENT:     Head: Atraumatic.  Eyes:     Extraocular Movements: Extraocular movements intact.     Conjunctiva/sclera: Conjunctivae normal.  Cardiovascular:     Rate and Rhythm: Normal rate and regular rhythm.     Heart sounds: Normal heart sounds.  Pulmonary:     Effort: Pulmonary effort is normal.     Breath sounds: Normal breath sounds.  Musculoskeletal:        General: Swelling, tenderness and signs of injury present. No deformity. Normal range of motion.     Cervical back: Normal range of motion and neck supple.     Comments: Lateral left knee effusion, ttp but good ROM both active and passive. Left 3rd toe diffusely bruised, ttp  Skin:    General: Skin is warm and dry.     Findings: Bruising present.  Neurological:     Mental Status: She is alert and oriented to person, place, and time.     Comments: Left lower extremity neurovascularly intact  Psychiatric:        Mood and Affect: Mood normal.         Thought Content: Thought content normal.        Judgment: Judgment normal.     UC Treatments / Results  Labs (all labs ordered are listed, but only abnormal results are displayed) Labs Reviewed - No data to display  EKG   Radiology No results found.  Procedures Procedures (including critical care time)  Medications Ordered in UC Medications - No data to display  Initial Impression / Assessment and Plan / UC Course  I have reviewed the triage vital signs and the nursing notes.  Pertinent labs & imaging results that were available during my care of the patient were reviewed by me and considered in my medical decision making (see chart for details).     X-rays left knee and left foot negative for bony injury. ACE wrap applied to left knee, discussed RICE, Tylenol and supportive home care. Return for worsening sxs.   Final Clinical Impressions(s) / UC Diagnoses   Final diagnoses:  Contusion of left knee, initial encounter  Contusion of lesser toe of left foot without damage to nail, initial encounter   Discharge Instructions   None    ED Prescriptions   None    PDMP not reviewed this encounter.   Volney American, [place] 01/26/21 2153

## 2021-02-26 DIAGNOSIS — I10 Essential (primary) hypertension: Secondary | ICD-10-CM | POA: Diagnosis not present

## 2021-02-26 DIAGNOSIS — E785 Hyperlipidemia, unspecified: Secondary | ICD-10-CM | POA: Diagnosis not present

## 2021-05-08 DIAGNOSIS — E782 Mixed hyperlipidemia: Secondary | ICD-10-CM | POA: Diagnosis not present

## 2021-05-08 DIAGNOSIS — R7301 Impaired fasting glucose: Secondary | ICD-10-CM | POA: Diagnosis not present

## 2021-05-13 DIAGNOSIS — D72820 Lymphocytosis (symptomatic): Secondary | ICD-10-CM | POA: Diagnosis not present

## 2021-05-13 DIAGNOSIS — R7301 Impaired fasting glucose: Secondary | ICD-10-CM | POA: Diagnosis not present

## 2021-05-13 DIAGNOSIS — D72829 Elevated white blood cell count, unspecified: Secondary | ICD-10-CM | POA: Insufficient documentation

## 2021-05-13 DIAGNOSIS — E782 Mixed hyperlipidemia: Secondary | ICD-10-CM | POA: Diagnosis not present

## 2021-05-13 DIAGNOSIS — R413 Other amnesia: Secondary | ICD-10-CM | POA: Insufficient documentation

## 2021-05-13 DIAGNOSIS — I482 Chronic atrial fibrillation, unspecified: Secondary | ICD-10-CM | POA: Diagnosis not present

## 2021-05-13 DIAGNOSIS — E559 Vitamin D deficiency, unspecified: Secondary | ICD-10-CM | POA: Diagnosis not present

## 2021-05-13 DIAGNOSIS — Z0001 Encounter for general adult medical examination with abnormal findings: Secondary | ICD-10-CM | POA: Diagnosis not present

## 2021-06-26 DIAGNOSIS — I482 Chronic atrial fibrillation, unspecified: Secondary | ICD-10-CM | POA: Diagnosis not present

## 2021-06-26 DIAGNOSIS — I10 Essential (primary) hypertension: Secondary | ICD-10-CM | POA: Diagnosis not present

## 2021-06-26 DIAGNOSIS — E782 Mixed hyperlipidemia: Secondary | ICD-10-CM | POA: Diagnosis not present

## 2021-07-02 DIAGNOSIS — R059 Cough, unspecified: Secondary | ICD-10-CM | POA: Insufficient documentation

## 2021-07-02 DIAGNOSIS — R0981 Nasal congestion: Secondary | ICD-10-CM | POA: Insufficient documentation

## 2021-07-02 DIAGNOSIS — J069 Acute upper respiratory infection, unspecified: Secondary | ICD-10-CM | POA: Insufficient documentation

## 2021-07-02 DIAGNOSIS — R067 Sneezing: Secondary | ICD-10-CM | POA: Diagnosis not present

## 2021-07-02 DIAGNOSIS — R062 Wheezing: Secondary | ICD-10-CM | POA: Diagnosis not present

## 2021-07-02 DIAGNOSIS — F411 Generalized anxiety disorder: Secondary | ICD-10-CM | POA: Diagnosis not present

## 2021-07-25 ENCOUNTER — Other Ambulatory Visit: Payer: Self-pay | Admitting: Cardiology

## 2021-07-25 NOTE — Telephone Encounter (Signed)
Pt called requesting Eliquis 5mg  samples. Indication: Atrial Fib Last office visit: 08/31/20  A Quinn FNP Scr: 0.59 on 09/22/20 Age: 81 Weight: 66.5kg   Based on above findings Eliquis 5mg  twice daily is the appropriate dose.  Pt is due for appt with MD.  She will need CBC/BMP done at that time.  Message sent to scheduler to make appt.  Refill approved till appt.

## 2021-08-14 DIAGNOSIS — Z08 Encounter for follow-up examination after completed treatment for malignant neoplasm: Secondary | ICD-10-CM | POA: Diagnosis not present

## 2021-08-14 DIAGNOSIS — Z85828 Personal history of other malignant neoplasm of skin: Secondary | ICD-10-CM | POA: Diagnosis not present

## 2021-08-14 DIAGNOSIS — L820 Inflamed seborrheic keratosis: Secondary | ICD-10-CM | POA: Diagnosis not present

## 2021-08-16 ENCOUNTER — Ambulatory Visit: Payer: Medicare Other | Admitting: Cardiology

## 2021-08-16 NOTE — Progress Notes (Deleted)
Clinical Summary Marissa Turner is a 81 y.o.female former patient of Dr Bronson Ing, this is our first visit together.    1.Permanent afib     2. COPD   3. Lung cancer s/p VATS LLL lobectomy  4. B cell lymphoma  5. HTN    Past Medical History:  Diagnosis Date   Atrial fibrillation, chronic ([place]) 2004   left atrial thrombus in 2004; near syncope in 2008; echocardiogram in 2008-normal EF, mild left atrial enlargement, no valvular abnormalities, lipomatous hypertrophy; no aortic atheroma on TEE in 2004   Cancer ([place])    LYMPH NODE    and lung cancer   Chronic anticoagulation 2004   warfarin   COPD (chronic obstructive pulmonary disease) ([place])    denies   Depression    DJD (degenerative joint disease), lumbosacral    Also hip   Dysrhythmia    chronic AFib   Family history of adverse reaction to anesthesia    sister PONV   Fracture of wrist    Left   History of kidney stones    once   Hypotension    Panic attacks    Peptic ulcer disease    s/p Billroth II   PONV (postoperative nausea and vomiting)    Tobacco abuse    40 pack years; quit attempt in 2012     Allergies  Allergen Reactions   Estrogens Swelling    edema   Codeine Swelling and Rash   Penicillins Other (See Comments)    Break out in welts. Did it involve swelling of the face/tongue/throat, SOB, or low BP? No Did it involve sudden or severe rash/hives, skin peeling, or any reaction on the inside of your mouth or nose? No Did you need to seek medical attention at a hospital or doctor's office? No When did it last happen?  Childhood allergy      If all above answers are "NO", may proceed with cephalosporin use.  Tolerated Cephalosporin Date: 07/26/19.     Diphenhydramine Hcl Other (See Comments)    hallucinations      Current Outpatient Medications  Medication Sig Dispense Refill   acetaminophen (TYLENOL) 650 MG CR tablet Take 1,300 mg by mouth every evening.      ALPRAZolam (XANAX) 1 MG  tablet Take 1 tablet (1 mg total) by mouth 3 (three) times daily as needed for anxiety. (Patient taking differently: Take 1 mg by mouth at bedtime.) 30 tablet 0   apixaban (ELIQUIS) 5 MG TABS tablet TAKE ONE TABLET (5MG  TOTAL) BY MOUTH TWOTIMES DAILY 60 tablet 1   citalopram (CELEXA) 20 MG tablet Take 20 mg by mouth daily.     diltiazem (CARDIZEM CD) 240 MG 24 hr capsule Take 240 mg by mouth daily.     meclizine (ANTIVERT) 12.5 MG tablet Take 1 tablet (12.5 mg total) by mouth 3 (three) times daily as needed for dizziness. 30 tablet 0   No current facility-administered medications for this visit.     Past Surgical History:  Procedure Laterality Date   ABDOMINAL HYSTERECTOMY     w/o oophorectomy  partial   APPENDECTOMY     CESAREAN SECTION     CHOLECYSTECTOMY  2004   [place]   patient denies ever having undergone colonoscopy as of 2014   [place] II; splenectomy; incidental appendectomy   KNEE ARTHROSCOPY WITH MEDIAL MENISECTOMY Right 02/26/2015   Procedure: KNEE ARTHROSCOPY WITH MEDIAL MENISECTOMY;  Surgeon: Sanjuana Kava, MD;  [facility];  Service: Orthopedics;  Laterality: Right;   LYMPH NODE BIOPSY Left 09/15/2013   Procedure: LEFT NECK CERVICAL NODE BIOPSY;  Surgeon: Scherry Ran, MD;  Location: [facility];  Service: General;  Laterality: Left;   PORT-A-CATH REMOVAL Right 07/12/2014   Procedure: REMOVAL PORT-A-CATH;  Surgeon: Aviva Signs Md, MD;  Location: [facility];  Service: General;  Laterality: Right;   PORTACATH PLACEMENT Right 10/21/2013   Procedure: ATTEMPTED INSERTION PORT-A-CATH;  Surgeon: Scherry Ran, MD;  [facility]: [facility];  Service: General;  Laterality: Right;   SPLENECTOMY, TOTAL     THORACOSCOPY Left 04/15/2018   VIDEO ASSISTED THORACOSCOPY (VATS)/LEFT LOWER LOBECTOMY (Left)   TONSILLECTOMY     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   TOTAL HIP ARTHROPLASTY Left 10/18/2018   Procedure: TOTAL HIP ARTHROPLASTY  ANTERIOR APPROACH;  Surgeon: Gaynelle Arabian, MD;  [facility];  Service: Orthopedics;  Laterality: Left;  145min   TOTAL KNEE ARTHROPLASTY Right 07/25/2019   Procedure: TOTAL KNEE ARTHROPLASTY;  Surgeon: Gaynelle Arabian, MD;  [facility];  Service: Orthopedics;  Laterality: Right;  44min   VIDEO ASSISTED THORACOSCOPY (VATS)/ LOBECTOMY Left 04/15/2018   Procedure: VIDEO ASSISTED THORACOSCOPY (VATS)/LEFT LOWER LOBECTOMY;  Surgeon: Melrose Nakayama, MD;  Location: [facility];  Service: Thoracic;  Laterality: Left;   WEDGE RESECTION Left 04/15/2018   Procedure: LEFT UPPER WEDGE RESECTION;  Surgeon: Melrose Nakayama, MD;  Location: [facility];  Service: Thoracic;  Laterality: Left;     Allergies  Allergen Reactions   Estrogens Swelling    edema   Codeine Swelling and Rash   Penicillins Other (See Comments)    Break out in welts. Did it involve swelling of the face/tongue/throat, SOB, or low BP? No Did it involve sudden or severe rash/hives, skin peeling, or any reaction on the inside of your mouth or nose? No Did you need to seek medical attention at a hospital or doctor's office? No When did it last happen?  Childhood allergy      If all above answers are "NO", may proceed with cephalosporin use.  Tolerated Cephalosporin Date: 07/26/19.     Diphenhydramine Hcl Other (See Comments)    hallucinations       Family History  Problem Relation Age of Onset   Heart attack Mother        also brother   Breast cancer Sister    Heart attack Brother    Breast cancer Sister        x2   Cancer Brother        Gastric carcinoma; also father   Cirrhosis Sister      Social History Marissa Turner reports that she has quit smoking. Her smoking use included cigarettes. She has a 27.50 pack-year smoking history. She has never used smokeless tobacco. Marissa Turner reports no history of alcohol use.   Review of Systems CONSTITUTIONAL: No weight loss, fever, chills, weakness or fatigue.  HEENT:  Eyes: No visual loss, blurred vision, double vision or yellow sclerae.No hearing loss, sneezing, congestion, runny nose or sore throat.  SKIN: No rash or itching.  CARDIOVASCULAR:  RESPIRATORY: No shortness of breath, cough or sputum.  GASTROINTESTINAL: No anorexia, nausea, vomiting or diarrhea. No abdominal pain or blood.  GENITOURINARY: No burning on urination, no polyuria NEUROLOGICAL: No headache, dizziness, syncope, paralysis, ataxia, numbness or tingling in the extremities. No change in bowel or bladder control.  MUSCULOSKELETAL: No muscle, back pain, joint pain or stiffness.  LYMPHATICS: No enlarged nodes. No history of splenectomy.  PSYCHIATRIC: No history of depression or anxiety.  ENDOCRINOLOGIC: No reports of sweating, cold or heat intolerance. No polyuria or polydipsia.  [name]   Physical Examination There were no vitals filed for this visit. There were no vitals filed for this visit.  Gen: resting comfortably, no acute distress HEENT: no scleral icterus, pupils equal round and reactive, no palptable cervical adenopathy,  CV Resp: Clear to auscultation bilaterally GI: abdomen is soft, non-tender, non-distended, normal bowel sounds, no hepatosplenomegaly MSK: extremities are warm, no edema.  Skin: warm, no rash Neuro:  no focal deficits Psych: appropriate affect   Diagnostic Studies     Assessment and Plan        Arnoldo Lenis, M.D., F.A.C.C.

## 2021-08-19 ENCOUNTER — Other Ambulatory Visit: Payer: Self-pay

## 2021-08-27 ENCOUNTER — Telehealth: Payer: Self-pay | Admitting: Cardiology

## 2021-08-27 NOTE — Telephone Encounter (Signed)
New Message:      Patient would like to know if she can get patient assistance to help her with her Eliquis please? Marissa Turner from Dr Juel Burrow office called. She said Dr Domenic Polite had prescribed her Eliquis and she would  need to get assistance from his office, please.

## 2021-08-27 NOTE — Telephone Encounter (Signed)
Spoke with pt and she agrees to complete Eliquis patient assistance forms. Will mail to pt.

## 2021-10-25 ENCOUNTER — Other Ambulatory Visit: Payer: Self-pay

## 2021-11-05 DIAGNOSIS — R7301 Impaired fasting glucose: Secondary | ICD-10-CM | POA: Diagnosis not present

## 2021-11-05 DIAGNOSIS — D72820 Lymphocytosis (symptomatic): Secondary | ICD-10-CM | POA: Diagnosis not present

## 2021-11-05 DIAGNOSIS — E782 Mixed hyperlipidemia: Secondary | ICD-10-CM | POA: Diagnosis not present

## 2021-11-12 DIAGNOSIS — E782 Mixed hyperlipidemia: Secondary | ICD-10-CM | POA: Diagnosis not present

## 2021-11-12 DIAGNOSIS — F411 Generalized anxiety disorder: Secondary | ICD-10-CM | POA: Diagnosis not present

## 2021-11-12 DIAGNOSIS — I482 Chronic atrial fibrillation, unspecified: Secondary | ICD-10-CM | POA: Diagnosis not present

## 2021-11-12 DIAGNOSIS — R7301 Impaired fasting glucose: Secondary | ICD-10-CM | POA: Diagnosis not present

## 2021-11-12 DIAGNOSIS — R252 Cramp and spasm: Secondary | ICD-10-CM | POA: Insufficient documentation

## 2021-11-12 DIAGNOSIS — D649 Anemia, unspecified: Secondary | ICD-10-CM | POA: Diagnosis not present

## 2021-11-12 DIAGNOSIS — F419 Anxiety disorder, unspecified: Secondary | ICD-10-CM | POA: Diagnosis not present

## 2021-11-12 DIAGNOSIS — I10 Essential (primary) hypertension: Secondary | ICD-10-CM | POA: Diagnosis not present

## 2021-11-12 DIAGNOSIS — E559 Vitamin D deficiency, unspecified: Secondary | ICD-10-CM | POA: Diagnosis not present

## 2021-11-12 DIAGNOSIS — D72820 Lymphocytosis (symptomatic): Secondary | ICD-10-CM | POA: Diagnosis not present

## 2021-11-12 DIAGNOSIS — R413 Other amnesia: Secondary | ICD-10-CM | POA: Diagnosis not present

## 2021-11-12 DIAGNOSIS — Z23 Encounter for immunization: Secondary | ICD-10-CM | POA: Diagnosis not present

## 2021-11-20 ENCOUNTER — Encounter: Payer: Self-pay | Admitting: Internal Medicine

## 2021-11-20 ENCOUNTER — Ambulatory Visit (INDEPENDENT_AMBULATORY_CARE_PROVIDER_SITE_OTHER): Payer: Medicare Other | Admitting: Internal Medicine

## 2021-11-26 NOTE — Progress Notes (Signed)
Erroneous encounter - please disregard.

## 2021-12-02 NOTE — Progress Notes (Unsigned)
Cardiology Office Note:    Date:  12/03/2021   ID:  Marissa Turner, [place] July 22, 1940, MRN [id number]  PCP:  Celene Squibb, MD   [facility] Cardiologist:  Freada Bergeron, MD   Referring MD: Celene Squibb, MD    History of Present Illness:    Marissa Turner is a 81 y.o. 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  Was last seen by Levell July, NP on 08/31/20 where she was doing well. Had recovered from knee surgery.  Today, the patient overall feels well. She remains active without significant symptoms. Has been very involved in her great grandchildren lives. No chest pain, SOB, orthopnea or LE edema, palpitations. Has some dizziness with bending over but otherwise no significant symptoms.   Has been tolerating apixaban with no bleeding issues. Has been taking iron and her stool is black from that.   Past Medical History:  Diagnosis Date   Atrial fibrillation, chronic ([place]) 2004   left atrial thrombus in 2004; near syncope in 2008; echocardiogram in 2008-normal EF, mild left atrial enlargement, no valvular abnormalities, lipomatous hypertrophy; no aortic atheroma on TEE in 2004   Cancer ([place])    LYMPH NODE    and lung cancer   Chronic anticoagulation 2004   warfarin   COPD (chronic obstructive pulmonary disease) ([place])    denies   Depression    DJD (degenerative joint disease), lumbosacral    Also hip   Dysrhythmia    chronic AFib   Family history of adverse reaction to anesthesia    sister PONV   Fracture of wrist    Left   History of kidney stones    once   Hypotension    Panic attacks    Peptic ulcer disease    s/p Billroth II   PONV (postoperative nausea and vomiting)    Tobacco abuse    40 pack years; quit attempt in 2012    Past Surgical History:  Procedure  Laterality Date   ABDOMINAL HYSTERECTOMY     w/o oophorectomy  partial   [place]  2004   [place]   patient denies ever having undergone colonoscopy as of 2014   [place] II; splenectomy; incidental appendectomy   KNEE ARTHROSCOPY WITH MEDIAL MENISECTOMY Right 02/26/2015   Procedure: KNEE ARTHROSCOPY WITH MEDIAL MENISECTOMY;  Surgeon: Sanjuana Kava, MD;  [facility];  Service: Orthopedics;  Laterality: Right;   LYMPH NODE BIOPSY Left 09/15/2013   Procedure: LEFT NECK CERVICAL NODE BIOPSY;  Surgeon: Scherry Ran, MD;  [facility];  Service: General;  Laterality: Left;   PORT-A-CATH REMOVAL Right 07/12/2014   Procedure: REMOVAL PORT-A-CATH;  Surgeon: Aviva Signs Md, MD;  [facility];  Service: General;  Laterality: Right;   PORTACATH PLACEMENT Right 10/21/2013   Procedure: ATTEMPTED INSERTION PORT-A-CATH;  Surgeon: Scherry Ran, MD;  [facility];  Service: General;  Laterality: Right;   SPLENECTOMY, TOTAL     THORACOSCOPY Left 04/15/2018   VIDEO ASSISTED THORACOSCOPY (VATS)/LEFT LOWER LOBECTOMY (Left)   TONSILLECTOMY     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   TOTAL HIP ARTHROPLASTY Left 10/18/2018   Procedure: TOTAL HIP ARTHROPLASTY ANTERIOR APPROACH;  Surgeon: Gaynelle Arabian, MD;  [facility];  Service: Orthopedics;  Laterality: Left;  158min   TOTAL KNEE ARTHROPLASTY Right 07/25/2019   Procedure: TOTAL KNEE ARTHROPLASTY;  Surgeon: Gaynelle Arabian, MD;  [facility];  Service: Orthopedics;  Laterality: Right;  41min   VIDEO ASSISTED THORACOSCOPY (VATS)/ LOBECTOMY Left 04/15/2018   Procedure: VIDEO ASSISTED THORACOSCOPY (VATS)/LEFT LOWER LOBECTOMY;  Surgeon: Melrose Nakayama, MD;  Location: [facility];  Service: Thoracic;  Laterality: Left;   WEDGE RESECTION Left 04/15/2018   Procedure: LEFT UPPER WEDGE RESECTION;  Surgeon: Melrose Nakayama, MD;  [facility];  Service: Thoracic;  Laterality: Left;    Current Medications: Current Meds  Medication Sig   acetaminophen (TYLENOL) 650 MG CR tablet Take 1,300 mg by mouth every evening.    ALPRAZolam (XANAX) 1 MG tablet Take 1 tablet (1 mg total) by mouth 3 (three) times daily as needed for anxiety. (Patient taking differently: Take 1 mg by mouth at bedtime.)   apixaban (ELIQUIS) 5 MG TABS tablet TAKE ONE TABLET (5MG  TOTAL) BY MOUTH TWOTIMES DAILY   citalopram (CELEXA) 20 MG tablet Take 20 mg by mouth daily.   diltiazem (CARDIZEM CD) 240 MG 24 hr capsule Take 240 mg by mouth daily.     Allergies:   Estrogens, Codeine, Penicillins, and Diphenhydramine hcl   Social History   Socioeconomic History   Marital status: Married    Spouse name: Not on file   Number of children: 2   Years of education: Not on file   Highest education level: Not on file  Occupational History   Occupation: Housecleaning  Tobacco Use   Smoking status: Former    Packs/day: 0.50    Years: 55.00    Total pack years: 27.50    Types: Cigarettes   Smokeless tobacco: Never   Tobacco comments:    quit quit in june  Vaping Use   Vaping Use: Never used  Substance and Sexual Activity   Alcohol use: No   Drug use: No   Sexual activity: Yes    Birth control/protection: Surgical  Other Topics Concern   Not on file  Social History Narrative   Married with one child and one adopted child, 1 stillbirth, and one deceased child at age 35 due to illness   No regular exercise   Social Determinants of Health   Financial Resource Strain: Not on file  Food Insecurity: Not on file  Transportation Needs: Not on file  Physical Activity: Not on file  Stress: Not on file  Social Connections: Not on file     Family History: The patient's family history includes Breast cancer in her sister and sister; Cancer in her brother; Cirrhosis in her sister; Heart attack in her brother and mother.  ROS:   Please see the history of  present illness.     All other systems reviewed and are negative.  EKGs/Labs/Other Studies Reviewed:    The following studies were reviewed today: Nuclear stress test 01/12/2018:   There was no ST segment deviation noted during stress. This is a low risk study. The left ventricular ejection fraction is normal (55-65%). 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 This area may be affected by artifact due to adjacent gut radiotracer uptake. Overall findings are low risk  EKG:  EKG is  ordered today.  The ekg ordered today demonstrates Afib with HR 77  Recent Labs: No results found for requested labs within last 365 days.  Recent Lipid Panel    Component Value Date/Time   CHOL 179 06/30/2013 0840   TRIG 173 (H) 06/30/2013 0840   HDL 37 (L) 06/30/2013 0840   CHOLHDL 4.8 06/30/2013 0840   VLDL 35 06/30/2013 0840   LDLCALC 107 (H) 06/30/2013 0840     Risk Assessment/Calculations:    CHA2DS2-VASc Score = 4  This indicates a 4.8% annual risk of stroke. The patient's score is based upon: CHF History: 0 HTN History: 1 Diabetes History: 0 Stroke History: 0 Vascular Disease History: 0 Age Score: 2 Gender Score: 1              Physical Exam:    VS:  BP 130/80   Pulse 77   Ht 5\' 1"  (1.549 m)   Wt 155 lb 12.8 oz (70.7 kg)   SpO2 90%   BMI 29.44 kg/m     Wt Readings from Last 3 Encounters:  12/03/21 155 lb 12.8 oz (70.7 kg)  09/22/20 167 lb 11.2 oz (76.1 kg)  08/31/20 146 lb 9.6 oz (66.5 kg)     GEN:  Well nourished, well developed in no acute distress HEENT: Normal NECK: No JVD; No carotid bruits CARDIAC: Irregularly irregular, no murmurs, rubs, gallops RESPIRATORY:  Clear to auscultation without rales, wheezing or rhonchi  ABDOMEN: Soft, non-tender, non-distended MUSCULOSKELETAL:  Trace edema, warm  SKIN: Warm and dry NEUROLOGIC:  Alert and oriented x 3 PSYCHIATRIC:  Normal affect   ASSESSMENT:    1.  Atrial fibrillation, chronic ([place])   2. Essential hypertension   3. Secondary hypercoagulable state ([place])    PLAN:    In order of problems listed above:  #Permanent Afib: CHADs-vasc 4. Well rate controlled and tolerating apixaban without issues. -Continue dilt 240mg  daily -Continue apixaban 5mg  BID  #HTN: Well controlled and at goal. -Continue dilt 240mg  daily           Medication Adjustments/Labs and Tests Ordered: Current medicines are reviewed at length with the patient today.  Concerns regarding medicines are outlined above.  Orders Placed This Encounter  Procedures   EKG 12-Lead   No orders of the defined types were placed in this encounter.   Patient Instructions  Medication Instructions:  Your physician recommends that you continue on your current medications as directed. Please refer to the Current Medication list given to you today.  *If you need a refill on your cardiac medications before your next appointment, please call your pharmacy*   Lab Work: NONE   If you have labs (blood work) drawn today and your tests are completely normal, you will receive your results only by: [place] (if you have MyChart) OR A paper copy in the mail If you have any lab test that is abnormal or we need to change your treatment, we will call you to review the results.   Testing/Procedures: NONE    Follow-Up: At [facility], you and your health needs are our priority.  As part of our continuing mission to provide you with exceptional heart care, we have created designated Provider Care Teams.  These Care Teams include your primary Cardiologist (physician) and Advanced Practice Providers (APPs -  Physician Assistants and Nurse Practitioners) who all work together to provide you with the care you need, when you need it.  We recommend signing up for the patient portal called "MyChart".  Sign up information is provided on this After Visit Summary.  MyChart is  used to connect with patients for Virtual Visits (Telemedicine).  Patients are able to view lab/test results, encounter notes, upcoming appointments, etc.  Non-urgent messages can be sent to your provider as well.   To learn more about what you can do with MyChart, go to [email or web address].    Your next appointment:   1 year(s)  The format for your next appointment:   In Person  Provider:   You may see Freada Bergeron, MD or one of the following Advanced Practice Providers on your designated Care Team:   Bernerd Pho, PA-C  Ermalinda Barrios, PA-C     Other Instructions Thank you for choosing [facility]!    Important Information About Sugar         Signed, Freada Bergeron, MD  12/03/2021 11:57 AM    [place]

## 2021-12-03 ENCOUNTER — Ambulatory Visit: Payer: Medicare Other | Attending: Internal Medicine | Admitting: Cardiology

## 2021-12-03 ENCOUNTER — Encounter: Payer: Self-pay | Admitting: Cardiology

## 2021-12-03 VITALS — BP 130/80 | HR 77 | Ht 61.0 in | Wt 155.8 lb

## 2021-12-03 DIAGNOSIS — I482 Chronic atrial fibrillation, unspecified: Secondary | ICD-10-CM

## 2021-12-03 DIAGNOSIS — I10 Essential (primary) hypertension: Secondary | ICD-10-CM | POA: Diagnosis not present

## 2021-12-03 DIAGNOSIS — D6869 Other thrombophilia: Secondary | ICD-10-CM

## 2021-12-03 MED ORDER — APIXABAN 5 MG PO TABS: ORAL_TABLET | ORAL | 3 refills | Status: DC

## 2021-12-03 MED ORDER — DILTIAZEM HCL ER COATED BEADS 240 MG PO CP24: 240.0000 mg | ORAL_CAPSULE | Freq: Every day | ORAL | 9 refills | Status: DC

## 2021-12-03 NOTE — Patient Instructions (Signed)
Medication Instructions:  Your physician recommends that you continue on your current medications as directed. Please refer to the Current Medication list given to you today.  *If you need a refill on your cardiac medications before your next appointment, please call your pharmacy*   Lab Work: NONE   If you have labs (blood work) drawn today and your tests are completely normal, you will receive your results only by: [place] (if you have MyChart) OR A paper copy in the mail If you have any lab test that is abnormal or we need to change your treatment, we will call you to review the results.   Testing/Procedures: NONE    Follow-Up: At [facility], you and your health needs are our priority.  As part of our continuing mission to provide you with exceptional heart care, we have created designated Provider Care Teams.  These Care Teams include your primary Cardiologist (physician) and Advanced Practice Providers (APPs -  Physician Assistants and Nurse Practitioners) who all work together to provide you with the care you need, when you need it.  We recommend signing up for the patient portal called "MyChart".  Sign up information is provided on this After Visit Summary.  MyChart is used to connect with patients for Virtual Visits (Telemedicine).  Patients are able to view lab/test results, encounter notes, upcoming appointments, etc.  Non-urgent messages can be sent to your provider as well.   To learn more about what you can do with MyChart, go to [email or web address].    Your next appointment:   1 year(s)  The format for your next appointment:   In Person  Provider:   You may see Freada Bergeron, MD or one of the following Advanced Practice Providers on your designated Care Team:   Bernerd Pho, PA-C  Ermalinda Barrios, PA-C     Other Instructions Thank you for choosing [facility]!    Important Information About Sugar

## 2021-12-03 NOTE — Addendum Note (Signed)
Addended by: Levonne Hubert on: 12/03/2021 12:16 PM   Modules accepted: Orders

## 2021-12-05 ENCOUNTER — Other Ambulatory Visit: Payer: Self-pay

## 2021-12-31 DIAGNOSIS — F411 Generalized anxiety disorder: Secondary | ICD-10-CM | POA: Diagnosis not present

## 2021-12-31 DIAGNOSIS — R252 Cramp and spasm: Secondary | ICD-10-CM | POA: Diagnosis not present

## 2021-12-31 DIAGNOSIS — D72820 Lymphocytosis (symptomatic): Secondary | ICD-10-CM | POA: Diagnosis not present

## 2021-12-31 DIAGNOSIS — I482 Chronic atrial fibrillation, unspecified: Secondary | ICD-10-CM | POA: Diagnosis not present

## 2021-12-31 DIAGNOSIS — I10 Essential (primary) hypertension: Secondary | ICD-10-CM | POA: Diagnosis not present

## 2021-12-31 DIAGNOSIS — D649 Anemia, unspecified: Secondary | ICD-10-CM | POA: Diagnosis not present

## 2021-12-31 DIAGNOSIS — F419 Anxiety disorder, unspecified: Secondary | ICD-10-CM | POA: Diagnosis not present

## 2021-12-31 DIAGNOSIS — R413 Other amnesia: Secondary | ICD-10-CM | POA: Diagnosis not present

## 2022-01-31 DIAGNOSIS — Z23 Encounter for immunization: Secondary | ICD-10-CM | POA: Diagnosis not present

## 2022-03-31 DIAGNOSIS — R059 Cough, unspecified: Secondary | ICD-10-CM | POA: Diagnosis not present

## 2022-03-31 DIAGNOSIS — R062 Wheezing: Secondary | ICD-10-CM | POA: Diagnosis not present

## 2022-04-22 DIAGNOSIS — X32XXXD Exposure to sunlight, subsequent encounter: Secondary | ICD-10-CM | POA: Diagnosis not present

## 2022-04-22 DIAGNOSIS — Z85828 Personal history of other malignant neoplasm of skin: Secondary | ICD-10-CM | POA: Diagnosis not present

## 2022-04-22 DIAGNOSIS — Z08 Encounter for follow-up examination after completed treatment for malignant neoplasm: Secondary | ICD-10-CM | POA: Diagnosis not present

## 2022-04-22 DIAGNOSIS — L821 Other seborrheic keratosis: Secondary | ICD-10-CM | POA: Diagnosis not present

## 2022-04-22 DIAGNOSIS — L570 Actinic keratosis: Secondary | ICD-10-CM | POA: Diagnosis not present

## 2022-05-05 ENCOUNTER — Emergency Department (HOSPITAL_COMMUNITY): Payer: Medicare Other

## 2022-05-05 ENCOUNTER — Inpatient Hospital Stay (HOSPITAL_COMMUNITY)
Admission: EM | Admit: 2022-05-05 | Discharge: 2022-05-07 | DRG: 189 | Disposition: A | Discharge status: Home Health | Payer: Medicare Other | Attending: Internal Medicine | Admitting: Internal Medicine

## 2022-05-05 ENCOUNTER — Encounter: Payer: Self-pay | Admitting: Hematology

## 2022-05-05 ENCOUNTER — Other Ambulatory Visit: Payer: Self-pay

## 2022-05-05 ENCOUNTER — Encounter (HOSPITAL_COMMUNITY): Payer: Self-pay | Admitting: Hematology

## 2022-05-05 ENCOUNTER — Encounter (HOSPITAL_COMMUNITY): Payer: Self-pay

## 2022-05-05 DIAGNOSIS — E8809 Other disorders of plasma-protein metabolism, not elsewhere classified: Secondary | ICD-10-CM | POA: Diagnosis present

## 2022-05-05 DIAGNOSIS — E876 Hypokalemia: Secondary | ICD-10-CM | POA: Insufficient documentation

## 2022-05-05 DIAGNOSIS — Z96651 Presence of right artificial knee joint: Secondary | ICD-10-CM | POA: Diagnosis present

## 2022-05-05 DIAGNOSIS — S0990XA Unspecified injury of head, initial encounter: Secondary | ICD-10-CM | POA: Diagnosis not present

## 2022-05-05 DIAGNOSIS — J9811 Atelectasis: Secondary | ICD-10-CM | POA: Diagnosis not present

## 2022-05-05 DIAGNOSIS — J439 Emphysema, unspecified: Secondary | ICD-10-CM | POA: Diagnosis present

## 2022-05-05 DIAGNOSIS — Z8249 Family history of ischemic heart disease and other diseases of the circulatory system: Secondary | ICD-10-CM | POA: Diagnosis not present

## 2022-05-05 DIAGNOSIS — R7989 Other specified abnormal findings of blood chemistry: Secondary | ICD-10-CM | POA: Insufficient documentation

## 2022-05-05 DIAGNOSIS — Z7189 Other specified counseling: Secondary | ICD-10-CM | POA: Diagnosis not present

## 2022-05-05 DIAGNOSIS — Z902 Acquired absence of lung [part of]: Secondary | ICD-10-CM

## 2022-05-05 DIAGNOSIS — I482 Chronic atrial fibrillation, unspecified: Secondary | ICD-10-CM | POA: Diagnosis not present

## 2022-05-05 DIAGNOSIS — E878 Other disorders of electrolyte and fluid balance, not elsewhere classified: Secondary | ICD-10-CM | POA: Diagnosis present

## 2022-05-05 DIAGNOSIS — H6503 Acute serous otitis media, bilateral: Secondary | ICD-10-CM | POA: Diagnosis not present

## 2022-05-05 DIAGNOSIS — Z96642 Presence of left artificial hip joint: Secondary | ICD-10-CM | POA: Diagnosis present

## 2022-05-05 DIAGNOSIS — Z79899 Other long term (current) drug therapy: Secondary | ICD-10-CM

## 2022-05-05 DIAGNOSIS — Z66 Do not resuscitate: Secondary | ICD-10-CM | POA: Diagnosis present

## 2022-05-05 DIAGNOSIS — R296 Repeated falls: Secondary | ICD-10-CM

## 2022-05-05 DIAGNOSIS — F32A Depression, unspecified: Secondary | ICD-10-CM | POA: Diagnosis present

## 2022-05-05 DIAGNOSIS — E46 Unspecified protein-calorie malnutrition: Secondary | ICD-10-CM | POA: Diagnosis not present

## 2022-05-05 DIAGNOSIS — E441 Mild protein-calorie malnutrition: Secondary | ICD-10-CM | POA: Diagnosis present

## 2022-05-05 DIAGNOSIS — Z87891 Personal history of nicotine dependence: Secondary | ICD-10-CM

## 2022-05-05 DIAGNOSIS — Z6829 Body mass index (BMI) 29.0-29.9, adult: Secondary | ICD-10-CM

## 2022-05-05 DIAGNOSIS — Z885 Allergy status to narcotic agent status: Secondary | ICD-10-CM

## 2022-05-05 DIAGNOSIS — J440 Chronic obstructive pulmonary disease with acute lower respiratory infection: Secondary | ICD-10-CM | POA: Diagnosis present

## 2022-05-05 DIAGNOSIS — R509 Fever, unspecified: Secondary | ICD-10-CM | POA: Diagnosis not present

## 2022-05-05 DIAGNOSIS — E119 Type 2 diabetes mellitus without complications: Secondary | ICD-10-CM | POA: Diagnosis present

## 2022-05-05 DIAGNOSIS — I4821 Permanent atrial fibrillation: Secondary | ICD-10-CM | POA: Diagnosis present

## 2022-05-05 DIAGNOSIS — I700 Atherosclerosis of aorta: Secondary | ICD-10-CM | POA: Diagnosis not present

## 2022-05-05 DIAGNOSIS — M1611 Unilateral primary osteoarthritis, right hip: Secondary | ICD-10-CM | POA: Diagnosis not present

## 2022-05-05 DIAGNOSIS — S3993XA Unspecified injury of pelvis, initial encounter: Secondary | ICD-10-CM | POA: Diagnosis not present

## 2022-05-05 DIAGNOSIS — Z7901 Long term (current) use of anticoagulants: Secondary | ICD-10-CM | POA: Diagnosis not present

## 2022-05-05 DIAGNOSIS — I5021 Acute systolic (congestive) heart failure: Secondary | ICD-10-CM | POA: Diagnosis not present

## 2022-05-05 DIAGNOSIS — Z9081 Acquired absence of spleen: Secondary | ICD-10-CM

## 2022-05-05 DIAGNOSIS — M4312 Spondylolisthesis, cervical region: Secondary | ICD-10-CM | POA: Diagnosis not present

## 2022-05-05 DIAGNOSIS — Z90710 Acquired absence of both cervix and uterus: Secondary | ICD-10-CM

## 2022-05-05 DIAGNOSIS — Z8572 Personal history of non-Hodgkin lymphomas: Secondary | ICD-10-CM | POA: Diagnosis not present

## 2022-05-05 DIAGNOSIS — J9601 Acute respiratory failure with hypoxia: Secondary | ICD-10-CM | POA: Diagnosis not present

## 2022-05-05 DIAGNOSIS — Z880 Allergy status to penicillin: Secondary | ICD-10-CM

## 2022-05-05 DIAGNOSIS — Z803 Family history of malignant neoplasm of breast: Secondary | ICD-10-CM

## 2022-05-05 DIAGNOSIS — R0602 Shortness of breath: Secondary | ICD-10-CM | POA: Diagnosis not present

## 2022-05-05 DIAGNOSIS — Z85118 Personal history of other malignant neoplasm of bronchus and lung: Secondary | ICD-10-CM | POA: Diagnosis not present

## 2022-05-05 DIAGNOSIS — J209 Acute bronchitis, unspecified: Secondary | ICD-10-CM | POA: Diagnosis not present

## 2022-05-05 DIAGNOSIS — Z800 Family history of malignant neoplasm of digestive organs: Secondary | ICD-10-CM | POA: Diagnosis not present

## 2022-05-05 DIAGNOSIS — Z888 Allergy status to other drugs, medicaments and biological substances status: Secondary | ICD-10-CM

## 2022-05-05 DIAGNOSIS — R059 Cough, unspecified: Secondary | ICD-10-CM | POA: Diagnosis not present

## 2022-05-05 DIAGNOSIS — J441 Chronic obstructive pulmonary disease with (acute) exacerbation: Secondary | ICD-10-CM | POA: Diagnosis not present

## 2022-05-05 DIAGNOSIS — Z9049 Acquired absence of other specified parts of digestive tract: Secondary | ICD-10-CM

## 2022-05-05 LAB — URINALYSIS, ROUTINE W REFLEX MICROSCOPIC
Bilirubin Urine: NEGATIVE
Glucose, UA: NEGATIVE mg/dL
Hgb urine dipstick: NEGATIVE
Ketones, ur: NEGATIVE mg/dL
Leukocytes,Ua: NEGATIVE
Nitrite: NEGATIVE
Protein, ur: NEGATIVE mg/dL
Specific Gravity, Urine: 1.003 — ABNORMAL LOW (ref 1.005–1.030)
pH: 5 (ref 5.0–8.0)

## 2022-05-05 LAB — COMPREHENSIVE METABOLIC PANEL
ALT: 17 U/L (ref 0–44)
AST: 19 U/L (ref 15–41)
Albumin: 3.1 g/dL — ABNORMAL LOW (ref 3.5–5.0)
Alkaline Phosphatase: 64 U/L (ref 38–126)
Anion gap: 10 (ref 5–15)
BUN: 19 mg/dL (ref 8–23)
CO2: 31 mmol/L (ref 22–32)
Calcium: 8.4 mg/dL — ABNORMAL LOW (ref 8.9–10.3)
Chloride: 97 mmol/L — ABNORMAL LOW (ref 98–111)
Creatinine, Ser: 0.65 mg/dL (ref 0.44–1.00)
GFR, Estimated: >60 mL/min (ref >60)
Glucose, Bld: 98 mg/dL (ref 70–99)
Potassium: 3.3 mmol/L — ABNORMAL LOW (ref 3.5–5.1)
Sodium: 138 mmol/L (ref 135–145)
Total Bilirubin: 0.5 mg/dL (ref 0.3–1.2)
Total Protein: 6.8 g/dL (ref 6.5–8.1)

## 2022-05-05 LAB — CBC
HCT: 37.5 % (ref 36.0–46.0)
Hemoglobin: 11.9 g/dL — ABNORMAL LOW (ref 12.0–15.0)
MCH: 28.5 pg (ref 26.0–34.0)
MCHC: 31.7 g/dL (ref 30.0–36.0)
MCV: 89.7 fL (ref 80.0–100.0)
Platelets: 343 10*3/uL (ref 150–400)
RBC: 4.18 MIL/uL (ref 3.87–5.11)
RDW: 15.4 % (ref 11.5–15.5)
WBC: 14.5 10*3/uL — ABNORMAL HIGH (ref 4.0–10.5)
nRBC: 0 % (ref 0.0–0.2)

## 2022-05-05 LAB — TROPONIN I (HIGH SENSITIVITY): Troponin I (High Sensitivity): 9 ng/L (ref <18)

## 2022-05-05 LAB — MAGNESIUM: Magnesium: 2 mg/dL (ref 1.7–2.4)

## 2022-05-05 LAB — BRAIN NATRIURETIC PEPTIDE: B Natriuretic Peptide: 254 pg/mL — ABNORMAL HIGH (ref 0.0–100.0)

## 2022-05-05 MED ORDER — IOHEXOL 350 MG/ML SOLN
75.0000 mL | Freq: Once | INTRAVENOUS | Status: AC | PRN
  Administered 2022-05-05: 75 mL via INTRAVENOUS

## 2022-05-05 MED ORDER — POTASSIUM CHLORIDE CRYS ER 20 MEQ PO TBCR
40.0000 meq | EXTENDED_RELEASE_TABLET | Freq: Once | ORAL | Status: AC
  Administered 2022-05-06: 40 meq via ORAL
  Filled 2022-05-05: qty 2

## 2022-05-05 NOTE — ED Notes (Signed)
Pts O2 dropped to 80 RA this tech placed nasal canula back on pt. Pt is stating at 93 on 2L

## 2022-05-05 NOTE — ED Provider Notes (Signed)
[place] [facility] Provider Note   CSN: [id number] Arrival date & time: 05/05/22  1623     History {Add pertinent medical, surgical, social history, OB history to HPI:1} Chief Complaint  Patient presents with   Follow-up   Fall    Marissa Turner is a 82 y.o. female.   Fall Pertinent negatives include no abdominal pain, no headaches and no shortness of breath.  Patient presents for recent falls.  Medical history includes arthritis, atrial fibrillation (on Eliquis), PUD, HTN, lymphoma, lung cancer.  She lives at home with her husband.  Over the past 3 nights, she has had a nightly fall at home.  She states that this is common to her because she often loses her balance.  During last night's fall, she did strike her head.  She is on Eliquis.  Her sister convinced her to go to urgent care prior to arrival.  At urgent care, she was probably directed to the ED.  Patient denies any current symptoms.  She reports that urgent care was concerned of fluid behind her eardrums.  She denies any recent ear pain.  She denies use of alcohol.     Home Medications Prior to Admission medications   Medication Sig Start Date End Date Taking? Authorizing Provider  acetaminophen (TYLENOL) 650 MG CR tablet Take 1,300 mg by mouth every evening.     [provider]  ALPRAZolam Prudy Feeler) 1 MG tablet Take 1 tablet (1 mg total) by mouth 3 (three) times daily as needed for anxiety. Patient taking differently: Take 1 mg by mouth at bedtime. 09/04/15   Ellouise Newer, PA-C  apixaban (ELIQUIS) 5 MG TABS tablet TAKE ONE TABLET (  TOTAL) BY MOUTH TWOTIMES DAILY 12/03/21   Meriam Sprague, MD  citalopram (CELEXA) 20 MG tablet Take 20 mg by mouth daily.    [provider]  diltiazem (CARDIZEM CD) 240 MG 24 hr capsule Take 1 capsule (240 mg total) by mouth daily. 12/03/21   Meriam Sprague, MD      Allergies    Estrogens, Codeine, Penicillins, and  Diphenhydramine hcl    Review of Systems   Review of Systems  Constitutional:  Negative for activity change, appetite change and fatigue.  Respiratory:  Negative for shortness of breath.   Gastrointestinal:  Negative for abdominal pain and nausea.  Neurological:  Negative for dizziness, speech difficulty, weakness, numbness and headaches.  All other systems reviewed and are negative.   Physical Exam Updated Vital Signs BP 104/70 (BP Location: Right Arm)   Pulse 86   Temp 98 F (36.7 C) (Oral)   Resp 20   Ht 5' (1.524 m)   Wt 68.5 kg   SpO2 (!) 86%   BMI 29.49 kg/m  Physical Exam Vitals and nursing note reviewed.  Constitutional:      General: She is not in acute distress.    Appearance: Normal appearance. She is well-developed. She is not ill-appearing, toxic-appearing or diaphoretic.  HENT:     Head: Normocephalic and atraumatic.     Right Ear: Tympanic membrane, ear canal and external ear normal.     Left Ear: Tympanic membrane, ear canal and external ear normal.     Nose: Nose normal.     Mouth/Throat:     Mouth: Mucous membranes are moist.  Eyes:     Extraocular Movements: Extraocular movements intact.     Conjunctiva/sclera: Conjunctivae normal.  Cardiovascular:     Rate and Rhythm:  Normal rate and regular rhythm.     Heart sounds: No murmur heard. Pulmonary:     Effort: Pulmonary effort is normal. No respiratory distress.  Abdominal:     General: There is no distension.     Palpations: Abdomen is soft.     Tenderness: There is no abdominal tenderness.  Musculoskeletal:        General: No swelling. Normal range of motion.     Cervical back: Normal range of motion and neck supple.     Right lower leg: No edema.     Left lower leg: No edema.  Skin:    General: Skin is warm and dry.     Coloration: Skin is not jaundiced or pale.  Neurological:     General: No focal deficit present.     Mental Status: She is alert and oriented to person, place, and time.      Cranial Nerves: No cranial nerve deficit.     Sensory: No sensory deficit.     Motor: No weakness.     Coordination: Coordination normal.  Psychiatric:        Mood and Affect: Mood normal.        Behavior: Behavior normal.        Thought Content: Thought content normal.        Judgment: Judgment normal.     ED Results / Procedures / Treatments   Labs (all labs ordered are listed, but only abnormal results are displayed) Labs Reviewed - No data to display  EKG None  Radiology No results found.  Procedures Procedures  {Document cardiac monitor, telemetry assessment procedure when appropriate:1}  Medications Ordered in ED Medications - No data to display  ED Course/ Medical Decision Making/ A&P   {   Click here for ABCD2, HEART and other calculatorsREFRESH Note before signing :1}                          Medical Decision Making  This patient presents to the ED for concern of ***, this involves an extensive number of treatment options, and is a complaint that carries with it a high risk of complications and morbidity.  The differential diagnosis includes ***   Co morbidities that complicate the patient evaluation  ***   Additional history obtained:  Additional history obtained from *** External records from outside source obtained and reviewed including ***   Lab Tests:  I Ordered, and personally interpreted labs.  The pertinent results include:  ***   Imaging Studies ordered:  I ordered imaging studies including ***  I independently visualized and interpreted imaging which showed *** I agree with the radiologist interpretation   Cardiac Monitoring: / EKG:  The patient was maintained on a cardiac monitor.  I personally viewed and interpreted the cardiac monitored which showed an underlying rhythm of: ***   Consultations Obtained:  I requested consultation with the ***,  and discussed lab and imaging findings as well as pertinent plan - they recommend:  ***   Problem List / ED Course / Critical interventions / Medication management  Patient presents for recent falls.  She states that this is a common occurrence for her.  She attributes them to being wobbly with ambulation.  During last night's fall, she did strike her head.  She is on Eliquis.  On arrival in the ED, there was a concern of hypoxia.  On my assessment, patient currently has normal SpO2 on room air.  She denies any current complaints.  Given her recent falls, CT imaging was ordered.  Patient to undergo laboratory workup as well to assess for possible acute causes of her recent falls.***. I ordered medication including ***  for ***  Reevaluation of the patient after these medicines showed that the patient {resolved/improved/worsened:23923::"improved"} I have reviewed the patients home medicines and have made adjustments as needed   Social Determinants of Health:  ***   Test / Admission - Considered:  ***    {Document critical care time when appropriate:1} {Document review of labs and clinical decision tools ie heart score, Chads2Vasc2 etc:1}  {Document your independent review of radiology images, and any outside records:1} {Document your discussion with family members, caretakers, and with consultants:1} {Document social determinants of health affecting pt's care:1} {Document your decision making why or why not admission, treatments were needed:1} Final Clinical Impression(s) / ED Diagnoses Final diagnoses:  None    Rx / DC Orders ED Discharge Orders     None

## 2022-05-05 NOTE — ED Notes (Signed)
Patient transported to CT 

## 2022-05-05 NOTE — ED Triage Notes (Addendum)
Pt was sent to the ED by [facility] with paperwork that shows a diagnosis of otitis media. Pt's family states pt has been falling frequently. Pt does not know why they told her to go to the ED. Pt noted to have low SpO2 of 86%.  Pt denies ShOB, cough, fever. Pt placed on 2L O2 via N/C in triage. Pt does state she hit her head last night during a fall. Pt is on anticoagulants. Pt is A/Ox4, denies LOC or vomiting.

## 2022-05-06 ENCOUNTER — Other Ambulatory Visit: Payer: Self-pay

## 2022-05-06 ENCOUNTER — Observation Stay (HOSPITAL_COMMUNITY): Payer: Medicare Other

## 2022-05-06 DIAGNOSIS — J9601 Acute respiratory failure with hypoxia: Secondary | ICD-10-CM | POA: Diagnosis present

## 2022-05-06 DIAGNOSIS — E441 Mild protein-calorie malnutrition: Secondary | ICD-10-CM | POA: Diagnosis present

## 2022-05-06 DIAGNOSIS — E876 Hypokalemia: Secondary | ICD-10-CM | POA: Diagnosis not present

## 2022-05-06 DIAGNOSIS — E878 Other disorders of electrolyte and fluid balance, not elsewhere classified: Secondary | ICD-10-CM | POA: Diagnosis present

## 2022-05-06 DIAGNOSIS — R7989 Other specified abnormal findings of blood chemistry: Secondary | ICD-10-CM | POA: Insufficient documentation

## 2022-05-06 DIAGNOSIS — Z803 Family history of malignant neoplasm of breast: Secondary | ICD-10-CM | POA: Diagnosis not present

## 2022-05-06 DIAGNOSIS — R0602 Shortness of breath: Secondary | ICD-10-CM | POA: Diagnosis present

## 2022-05-06 DIAGNOSIS — R296 Repeated falls: Secondary | ICD-10-CM

## 2022-05-06 DIAGNOSIS — Z800 Family history of malignant neoplasm of digestive organs: Secondary | ICD-10-CM | POA: Diagnosis not present

## 2022-05-06 DIAGNOSIS — Z8572 Personal history of non-Hodgkin lymphomas: Secondary | ICD-10-CM | POA: Diagnosis not present

## 2022-05-06 DIAGNOSIS — F32A Depression, unspecified: Secondary | ICD-10-CM | POA: Diagnosis present

## 2022-05-06 DIAGNOSIS — Z85118 Personal history of other malignant neoplasm of bronchus and lung: Secondary | ICD-10-CM | POA: Diagnosis not present

## 2022-05-06 DIAGNOSIS — I5021 Acute systolic (congestive) heart failure: Secondary | ICD-10-CM

## 2022-05-06 DIAGNOSIS — I482 Chronic atrial fibrillation, unspecified: Secondary | ICD-10-CM | POA: Diagnosis not present

## 2022-05-06 DIAGNOSIS — Z902 Acquired absence of lung [part of]: Secondary | ICD-10-CM | POA: Diagnosis not present

## 2022-05-06 DIAGNOSIS — Z7901 Long term (current) use of anticoagulants: Secondary | ICD-10-CM | POA: Diagnosis not present

## 2022-05-06 DIAGNOSIS — Z87891 Personal history of nicotine dependence: Secondary | ICD-10-CM | POA: Diagnosis not present

## 2022-05-06 DIAGNOSIS — E46 Unspecified protein-calorie malnutrition: Secondary | ICD-10-CM | POA: Diagnosis not present

## 2022-05-06 DIAGNOSIS — J209 Acute bronchitis, unspecified: Secondary | ICD-10-CM | POA: Diagnosis not present

## 2022-05-06 DIAGNOSIS — J439 Emphysema, unspecified: Secondary | ICD-10-CM | POA: Diagnosis present

## 2022-05-06 DIAGNOSIS — E119 Type 2 diabetes mellitus without complications: Secondary | ICD-10-CM | POA: Diagnosis present

## 2022-05-06 DIAGNOSIS — J441 Chronic obstructive pulmonary disease with (acute) exacerbation: Secondary | ICD-10-CM | POA: Diagnosis present

## 2022-05-06 DIAGNOSIS — Z7189 Other specified counseling: Secondary | ICD-10-CM | POA: Diagnosis not present

## 2022-05-06 DIAGNOSIS — E8809 Other disorders of plasma-protein metabolism, not elsewhere classified: Secondary | ICD-10-CM

## 2022-05-06 DIAGNOSIS — J440 Chronic obstructive pulmonary disease with acute lower respiratory infection: Secondary | ICD-10-CM | POA: Diagnosis present

## 2022-05-06 DIAGNOSIS — Z66 Do not resuscitate: Secondary | ICD-10-CM | POA: Diagnosis present

## 2022-05-06 DIAGNOSIS — Z96651 Presence of right artificial knee joint: Secondary | ICD-10-CM | POA: Diagnosis present

## 2022-05-06 DIAGNOSIS — I4821 Permanent atrial fibrillation: Secondary | ICD-10-CM | POA: Diagnosis present

## 2022-05-06 DIAGNOSIS — Z8249 Family history of ischemic heart disease and other diseases of the circulatory system: Secondary | ICD-10-CM | POA: Diagnosis not present

## 2022-05-06 LAB — ECHOCARDIOGRAM COMPLETE
AR max vel: 2.11 cm2
AV Area VTI: 2.21 cm2
AV Area mean vel: 2.14 cm2
AV Mean grad: 3.6 mmHg
AV Peak grad: 7.5 mmHg
Ao pk vel: 1.37 m/s
Area-P 1/2: 3.54 cm2
Height: 60 in
S' Lateral: 2.9 cm
Weight: 2416 oz

## 2022-05-06 LAB — MAGNESIUM: Magnesium: 1.9 mg/dL (ref 1.7–2.4)

## 2022-05-06 LAB — BASIC METABOLIC PANEL
Anion gap: 12 (ref 5–15)
BUN: 14 mg/dL (ref 8–23)
CO2: 28 mmol/L (ref 22–32)
Calcium: 8.2 mg/dL — ABNORMAL LOW (ref 8.9–10.3)
Chloride: 98 mmol/L (ref 98–111)
Creatinine, Ser: 0.72 mg/dL (ref 0.44–1.00)
GFR, Estimated: >60 mL/min (ref >60)
Glucose, Bld: 228 mg/dL — ABNORMAL HIGH (ref 70–99)
Potassium: 2.9 mmol/L — ABNORMAL LOW (ref 3.5–5.1)
Sodium: 138 mmol/L (ref 135–145)

## 2022-05-06 MED ORDER — IPRATROPIUM-ALBUTEROL 0.5-2.5 (3) MG/3ML IN SOLN
3.0000 mL | Freq: Once | RESPIRATORY_TRACT | Status: AC
  Administered 2022-05-06: 3 mL via RESPIRATORY_TRACT
  Filled 2022-05-06: qty 3

## 2022-05-06 MED ORDER — DOXYCYCLINE HYCLATE 100 MG PO TABS
100.0000 mg | ORAL_TABLET | Freq: Two times a day (BID) | ORAL | Status: DC
  Administered 2022-05-06 – 2022-05-07 (×4): 100 mg via ORAL
  Filled 2022-05-06 (×4): qty 1

## 2022-05-06 MED ORDER — PREDNISONE 20 MG PO TABS
40.0000 mg | ORAL_TABLET | Freq: Every day | ORAL | Status: DC
  Administered 2022-05-07: 40 mg via ORAL
  Filled 2022-05-06: qty 2

## 2022-05-06 MED ORDER — ONDANSETRON HCL 4 MG/2ML IJ SOLN: 4.0000 mg | Freq: Four times a day (QID) | INTRAMUSCULAR | Status: DC | PRN

## 2022-05-06 MED ORDER — IPRATROPIUM-ALBUTEROL 0.5-2.5 (3) MG/3ML IN SOLN
3.0000 mL | Freq: Four times a day (QID) | RESPIRATORY_TRACT | Status: DC
  Administered 2022-05-06: 3 mL via RESPIRATORY_TRACT
  Filled 2022-05-06: qty 3

## 2022-05-06 MED ORDER — IPRATROPIUM-ALBUTEROL 0.5-2.5 (3) MG/3ML IN SOLN: 3.0000 mL | RESPIRATORY_TRACT | Status: DC | PRN

## 2022-05-06 MED ORDER — ACETAMINOPHEN 325 MG PO TABS
650.0000 mg | ORAL_TABLET | Freq: Four times a day (QID) | ORAL | Status: DC | PRN
  Administered 2022-05-06: 650 mg via ORAL
  Filled 2022-05-06: qty 2

## 2022-05-06 MED ORDER — APIXABAN 5 MG PO TABS
5.0000 mg | ORAL_TABLET | Freq: Two times a day (BID) | ORAL | Status: DC
  Administered 2022-05-06 – 2022-05-07 (×2): 5 mg via ORAL
  Filled 2022-05-06 (×2): qty 1

## 2022-05-06 MED ORDER — ENSURE ENLIVE PO LIQD
237.0000 mL | Freq: Two times a day (BID) | ORAL | Status: DC
  Administered 2022-05-06: 237 mL via ORAL
  Filled 2022-05-06 (×6): qty 237

## 2022-05-06 MED ORDER — ALBUTEROL SULFATE (2.5 MG/3ML) 0.083% IN NEBU
10.0000 mg/h | INHALATION_SOLUTION | Freq: Once | RESPIRATORY_TRACT | Status: AC
  Administered 2022-05-06: 10 mg/h via RESPIRATORY_TRACT
  Filled 2022-05-06: qty 12

## 2022-05-06 MED ORDER — ACETAMINOPHEN 650 MG RE SUPP: 650.0000 mg | Freq: Four times a day (QID) | RECTAL | Status: DC | PRN

## 2022-05-06 MED ORDER — DILTIAZEM HCL ER COATED BEADS 240 MG PO CP24
240.0000 mg | ORAL_CAPSULE | Freq: Every day | ORAL | Status: DC
  Administered 2022-05-07: 240 mg via ORAL
  Filled 2022-05-06: qty 1

## 2022-05-06 MED ORDER — PANTOPRAZOLE SODIUM 40 MG PO TBEC
40.0000 mg | DELAYED_RELEASE_TABLET | Freq: Every day | ORAL | Status: DC
  Administered 2022-05-06 – 2022-05-07 (×2): 40 mg via ORAL
  Filled 2022-05-06 (×2): qty 1

## 2022-05-06 MED ORDER — DM-GUAIFENESIN ER 30-600 MG PO TB12
1.0000 | ORAL_TABLET | Freq: Two times a day (BID) | ORAL | Status: DC
  Administered 2022-05-06 – 2022-05-07 (×3): 1 via ORAL
  Filled 2022-05-06 (×3): qty 1

## 2022-05-06 MED ORDER — LORAZEPAM 0.5 MG PO TABS: 0.5000 mg | ORAL_TABLET | ORAL | Status: DC | PRN

## 2022-05-06 MED ORDER — HYDROXYZINE HCL 10 MG PO TABS
10.0000 mg | ORAL_TABLET | Freq: Three times a day (TID) | ORAL | Status: DC | PRN
  Administered 2022-05-06: 10 mg via ORAL
  Filled 2022-05-06: qty 1

## 2022-05-06 MED ORDER — POTASSIUM CHLORIDE CRYS ER 20 MEQ PO TBCR
40.0000 meq | EXTENDED_RELEASE_TABLET | ORAL | Status: AC
  Administered 2022-05-06 – 2022-05-07 (×2): 40 meq via ORAL
  Filled 2022-05-06 (×2): qty 2

## 2022-05-06 MED ORDER — DILTIAZEM HCL ER COATED BEADS 240 MG PO CP24
240.0000 mg | ORAL_CAPSULE | Freq: Every day | ORAL | Status: DC
  Administered 2022-05-06: 240 mg via ORAL
  Filled 2022-05-06: qty 1

## 2022-05-06 MED ORDER — APIXABAN 5 MG PO TABS: 5.0000 mg | ORAL_TABLET | Freq: Two times a day (BID) | ORAL | Status: DC

## 2022-05-06 MED ORDER — METHYLPREDNISOLONE SODIUM SUCC 125 MG IJ SOLR
125.0000 mg | Freq: Once | INTRAMUSCULAR | Status: AC
  Administered 2022-05-06: 125 mg via INTRAVENOUS
  Filled 2022-05-06: qty 2

## 2022-05-06 MED ORDER — SODIUM CHLORIDE 0.9 % IV SOLN
1.0000 g | Freq: Once | INTRAVENOUS | Status: AC
  Administered 2022-05-06: 1 g via INTRAVENOUS
  Filled 2022-05-06: qty 10

## 2022-05-06 MED ORDER — METHYLPREDNISOLONE SODIUM SUCC 40 MG IJ SOLR: 40.0000 mg | Freq: Two times a day (BID) | INTRAMUSCULAR | Status: DC

## 2022-05-06 MED ORDER — HYDROXYZINE HCL 10 MG/5ML PO SYRP: 10.0000 mg | ORAL_SOLUTION | Freq: Three times a day (TID) | ORAL | Status: DC | PRN

## 2022-05-06 MED ORDER — ONDANSETRON HCL 4 MG PO TABS: 4.0000 mg | ORAL_TABLET | Freq: Four times a day (QID) | ORAL | Status: DC | PRN

## 2022-05-06 MED ORDER — GUAIFENESIN ER 600 MG PO TB12
600.0000 mg | ORAL_TABLET | Freq: Two times a day (BID) | ORAL | Status: DC
  Administered 2022-05-06 – 2022-05-07 (×2): 600 mg via ORAL
  Filled 2022-05-06 (×3): qty 1

## 2022-05-06 NOTE — Care Management Important Message (Deleted)
Important Message  Patient Details  Name: Marissa Turner MRN: [id number] Date of Birth: 09/06/1940   Medicare Important Message Given:  Yes     Corey Harold 05/06/2022, 12:21 PM

## 2022-05-06 NOTE — Plan of Care (Signed)
  Problem: Acute Rehab PT Goals(only PT should resolve) Goal: Pt Will Go Supine/Side To Sit Outcome: Progressing Flowsheets (Taken 05/06/2022 1155) Pt will go Supine/Side to Sit:  Independently  with modified independence Goal: Patient Will Transfer Sit To/From Stand Outcome: Progressing Flowsheets (Taken 05/06/2022 1155) Patient will transfer sit to/from stand: with modified independence Goal: Pt Will Transfer Bed To Chair/Chair To Bed Outcome: Progressing Flowsheets (Taken 05/06/2022 1155) Pt will Transfer Bed to Chair/Chair to Bed: with modified independence Goal: Pt Will Ambulate Outcome: Progressing Flowsheets (Taken 05/06/2022 1155) Pt will Ambulate:  > 125 feet  with modified independence  with rolling walker  with least restrictive assistive device   11:57 AM, 05/06/22 Ocie Bob, MPT Physical Therapist with [facility] 336 [phone] office [phone] mobile phone

## 2022-05-06 NOTE — H&P (Signed)
History and Physical    Patient: Marissa NASEEM HQR:[id number] DOB: 1940/04/28 DOA: 05/05/2022 DOS: the patient was seen and examined on 05/06/2022 PCP: Benita Stabile, MD  Patient coming from: Home  Chief Complaint:  Chief Complaint  Patient presents with   Follow-up   Fall   HPI: Marissa Turner Lorig is a 82 y.o. 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  She fell last night and strike her head, so her sister convinced her to go to an urgent care where she was eventually directed to go to the ED for further evaluation and management.  Patient states that the urgent care was concerned of fluid behind her eardrums, however patient denied any ear pain or use of alcohol.  She denies headache, blurry vision.  ED Course:  In the emergency department, patient was noted to be hypoxic with an O2 sat of 86% on room air, but other vital signs were normal, though BP was soft at 104/70 on arrival.  Workup in the ED showed normal CBC except WBC of 14.5, hemoglobin 11.9.  BMP was normal except for potassium of 3.3, chloride 97, albumin 3.1, BNP 254, troponin 9, magnesium 2.0, urinalysis was normal. CT angiography chest with contrast was negative for acute pulmonary embolus but showed emphysema.  Left postsurgical changes with chronic small left pleural effusion.  No acute airspace disease CT head without contrast and CT cervical spine without contrast showed no acute rectal abnormality and no acute cervical spine abnormality. Air-fluid level in the right maxillary sinus and small amount of fluid in the left sphenoid sinus. Correlate for sinusitis. Showed cardiomegaly and left lower lobe subsegmental linear atelectasis and chronic elevation  of the left hemidiaphragm. Breathing treatment was provided, patient was treated with IV ceftriaxone and doxycycline, Solu-Medrol was given, potassium was replenished. Hospitalist was asked to admit patient for further evaluation and management.  Review of Systems: Review of systems as noted in the HPI. All other systems reviewed and are negative.   Past Medical History:  Diagnosis Date   Atrial fibrillation, chronic 2004   left atrial thrombus in 2004; near syncope in 2008; echocardiogram in 2008-normal EF, mild left atrial enlargement, no valvular abnormalities, lipomatous hypertrophy; no aortic atheroma on TEE in 2004   Cancer    LYMPH NODE    and lung cancer   Chronic anticoagulation 2004   warfarin   COPD (chronic obstructive pulmonary disease)    denies   Depression    DJD (degenerative joint disease), lumbosacral    Also hip   Dysrhythmia    chronic AFib   Family history of adverse reaction to anesthesia    sister PONV   Fracture of wrist    Left   History of kidney stones    once   Hypotension    Panic attacks    Peptic ulcer disease    s/p Billroth II   PONV (postoperative nausea and vomiting)    Tobacco abuse    40 pack years; quit attempt in 2012   Past Surgical History:  Procedure Laterality Date   ABDOMINAL HYSTERECTOMY     w/o oophorectomy  partial   APPENDECTOMY     CESAREAN SECTION     CHOLECYSTECTOMY  2004   [facility]  COLONOSCOPY  1980s   patient denies ever having undergone colonoscopy as of 2014   GASTROJEJUNOSTOMY  1984   Billroth II; splenectomy; incidental appendectomy   KNEE ARTHROSCOPY WITH MEDIAL MENISECTOMY Right 02/26/2015   Procedure: KNEE ARTHROSCOPY WITH MEDIAL MENISECTOMY;  Surgeon: Darreld Mclean, MD;  Location: [facility];  Service: Orthopedics;  Laterality: Right;   LYMPH NODE BIOPSY Left 09/15/2013   Procedure: LEFT NECK CERVICAL NODE BIOPSY;  Surgeon: Marlane Hatcher, MD;  Location: [facility];  Service: General;  Laterality:  Left;   PORT-A-CATH REMOVAL Right 07/12/2014   Procedure: REMOVAL PORT-A-CATH;  Surgeon: Franky Macho Md, MD;  Location: [facility];  Service: General;  Laterality: Right;   PORTACATH PLACEMENT Right 10/21/2013   Procedure: ATTEMPTED INSERTION PORT-A-CATH;  Surgeon: Marlane Hatcher, MD;  Location: [facility];  Service: General;  Laterality: Right;   SPLENECTOMY, TOTAL     THORACOSCOPY Left 04/15/2018   VIDEO ASSISTED THORACOSCOPY (VATS)/LEFT LOWER LOBECTOMY (Left)   TONSILLECTOMY     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   TOTAL HIP ARTHROPLASTY Left 10/18/2018   Procedure: TOTAL HIP ARTHROPLASTY ANTERIOR APPROACH;  Surgeon: Ollen Gross, MD;  Location: [facility];  Service: Orthopedics;  Laterality: Left;    TOTAL KNEE ARTHROPLASTY Right 07/25/2019   Procedure: TOTAL KNEE ARTHROPLASTY;  Surgeon: Ollen Gross, MD;  Location: [facility];  Service: Orthopedics;  Laterality: Right;    VIDEO ASSISTED THORACOSCOPY (VATS)/ LOBECTOMY Left 04/15/2018   Procedure: VIDEO ASSISTED THORACOSCOPY (VATS)/LEFT LOWER LOBECTOMY;  Surgeon: Loreli Slot, MD;  [facility];  Service: Thoracic;  Laterality: Left;   WEDGE RESECTION Left 04/15/2018   Procedure: LEFT UPPER WEDGE RESECTION;  Surgeon: Loreli Slot, MD;  Location: [facility] OR;  Service: Thoracic;  Laterality: Left;    Social History:  reports that she has quit smoking. Her smoking use included cigarettes. She has a 27.50 pack-year smoking history. She has never used smokeless tobacco. She reports that she does not drink alcohol and does not use drugs.   Allergies  Allergen Reactions   Estrogens Swelling    edema   Codeine Swelling and Rash   Penicillins Other (See Comments)    Break out in welts. Did it involve swelling of the face/tongue/throat, SOB, or low BP? No Did it involve sudden or severe rash/hives, skin peeling, or any reaction on the inside of your mouth or nose? No Did you need to seek medical attention at a hospital or doctor's  office? No When did it last happen?  Childhood allergy      If all above answers are "NO", may proceed with cephalosporin use.  Tolerated Cephalosporin Date: 07/26/19.     Diphenhydramine Hcl Other (See Comments)    hallucinations     Family History  Problem Relation Age of Onset   Heart attack Mother        also brother   Breast cancer Sister    Heart attack Brother    Breast cancer Sister        x2   Cancer Brother        Gastric carcinoma; also father   Cirrhosis Sister      Prior to Admission medications   Medication Sig Start Date End Date Taking? Authorizing Provider  acetaminophen (TYLENOL) 650 MG CR tablet Take 1,300 mg by mouth every evening.     [provider]  ALPRAZolam Prudy Feeler) 1 MG tablet Take 1 tablet (1 mg total) by mouth 3 (three) times daily as needed for  anxiety. Patient taking differently: Take 1 mg by mouth at bedtime. 09/04/15   Ellouise Newer, PA-C  apixaban (ELIQUIS) 5 MG TABS tablet TAKE ONE TABLET (5MG  TOTAL) BY MOUTH TWOTIMES DAILY 12/03/21   Meriam Sprague, MD  citalopram (CELEXA) 20 MG tablet Take 20 mg by mouth daily.    [provider]  diltiazem (CARDIZEM CD) 240 MG 24 hr capsule Take 1 capsule (240 mg total) by mouth daily. 12/03/21   Meriam Sprague, MD    Physical Exam: BP (!) 100/56   Pulse 82   Temp 98 F (36.7 C) (Oral)   Resp (!) 25   Ht 5' (1.524 m)   Wt 68.5 kg   SpO2 98%   BMI 29.49 kg/m   General: 82 y.o. year-old female well developed well nourished in no acute distress.  Alert and oriented x3. HEENT: NCAT, EOMI Neck: Supple, trachea medial Cardiovascular: Regular rate and rhythm with no rubs or gallops.  No thyromegaly or JVD noted.  No lower extremity edema. 2/4 pulses in all 4 extremities. Respiratory: Rales and wheezes in RLL.   Abdomen: Soft, nontender nondistended with normal bowel sounds x4 quadrants. Muskuloskeletal: No cyanosis, clubbing or edema noted bilaterally Neuro: CN II-XII  intact, strength 5/5 x 4, sensation, reflexes intact Skin: No ulcerative lesions noted or rashes Psychiatry: Judgement and insight appear normal. Mood is appropriate for condition and setting          Labs on Admission:  Basic Metabolic Panel: Recent Labs  Lab 05/05/22 1832  NA 138  K 3.3*  CL 97*  CO2 31  GLUCOSE 98  BUN 19  CREATININE 0.65  CALCIUM 8.4*  MG 2.0   Liver Function Tests: Recent Labs  Lab 05/05/22 1832  AST 19  ALT 17  ALKPHOS 64  BILITOT 0.5  PROT 6.8  ALBUMIN 3.1*   No results for input(s): "LIPASE", "AMYLASE" in the last 168 hours. No results for input(s): "AMMONIA" in the last 168 hours. CBC: Recent Labs  Lab 05/05/22 1832  WBC 14.5*  HGB 11.9*  HCT 37.5  MCV 89.7  PLT 343   Cardiac Enzymes: No results for input(s): "CKTOTAL", "CKMB", "CKMBINDEX", "TROPONINI" in the last 168 hours.  BNP (last 3 results) Recent Labs    05/05/22 1832  BNP 254.0*    ProBNP (last 3 results) No results for input(s): "PROBNP" in the last 8760 hours.  CBG: No results for input(s): "GLUCAP" in the last 168 hours.  Radiological Exams on Admission: CT Angio Chest PE W and/or Wo Contrast  Result Date: 05/05/2022 CLINICAL DATA:  Frequent fall low oxygen shortness of breath and fever EXAM: CT ANGIOGRAPHY CHEST WITH CONTRAST TECHNIQUE: Multidetector CT imaging of the chest was performed using the standard protocol during bolus administration of intravenous contrast. Multiplanar CT image reconstructions and MIPs were obtained to evaluate the vascular anatomy. RADIATION DOSE REDUCTION: This exam was performed according to the departmental dose-optimization program which includes automated exposure control, adjustment of the mA and/or kV according to patient size and/or use of iterative reconstruction technique. CONTRAST:  78mL OMNIPAQUE IOHEXOL 350 MG/ML SOLN COMPARISON:  Chest x-ray 05/05/2022, chest CT 09/22/2020 FINDINGS: Cardiovascular: Satisfactory opacification  of the pulmonary arteries to the segmental level. No evidence of pulmonary embolism. Mild aortic atherosclerosis. Slightly enlarged main pulmonary arteries suggesting hypertension. Mild cardiomegaly. No pericardial effusion Mediastinum/Nodes: Midline trachea. No thyroid mass. Pretracheal lymph node measuring 8 mm, previously 7 mm. Precarinal lymph node measuring 17 mm, previously 14 mm. Prevascular  lymph node measuring 9 mm, previously 7 mm. Esophagus within normal limits. Lungs/Pleura: Emphysema. Left lower lobectomy changes. No acute airspace disease. Small left pleural effusion which is chronic. 7 mm right upper lobe anterior nodule or area of mucous plugging series 6, image 53 is unchanged. No new suspicious pulmonary nodules. Bandlike areas of scarring at the left base. Upper Abdomen: No acute finding. Musculoskeletal: No acute or suspicious osseous abnormality. Review of the MIP images confirms the above findings. IMPRESSION: 1. Negative for acute pulmonary embolus. 2. Emphysema. Left postsurgical changes with chronic small left pleural effusion. No acute airspace disease. 3. Slight interval increase in size of mediastinal lymph nodes, nonspecific, could be reactive, but attention on follow-up imaging. 4. Aortic atherosclerosis. Aortic Atherosclerosis (ICD10-I70.0) and Emphysema (ICD10-J43.9). Electronically Signed   By: Jasmine Pang M.D.   On: 05/05/2022 23:19   CT HEAD WO CONTRAST  Result Date: 05/05/2022 CLINICAL DATA:  Trauma. EXAM: CT HEAD WITHOUT CONTRAST CT CERVICAL SPINE WITHOUT CONTRAST TECHNIQUE: Multidetector CT imaging of the head and cervical spine was performed following the standard protocol without intravenous contrast. Multiplanar CT image reconstructions of the cervical spine were also generated. RADIATION DOSE REDUCTION: This exam was performed according to the departmental dose-optimization program which includes automated exposure control, adjustment of the mA and/or kV according to  patient size and/or use of iterative reconstruction technique. COMPARISON:  CT head/cervical spine 09/22/2020 FINDINGS: CT HEAD FINDINGS Brain: Ventricles and sulci are prominent compatible with atrophy. Periventricular and subcortical white matter hypodensities compatible with chronic microvascular ischemic changes. No evidence for acute cortically based infarct, intracranial hemorrhage, mass lesion or mass effect. Vascular: No hyperdense vessel or unexpected calcification. Skull: Normal. Negative for fracture or focal lesion. Sinuses/Orbits: Air-fluid level right maxillary sinus. Small amount of fluid in the left sphenoid sinus. Mastoid air cells unremarkable. Other: None CT CERVICAL SPINE FINDINGS Alignment: Minimal grade 1 anterolisthesis C3 on C4 and C4 on C5. This is likely secondary to facet degenerative changes. Skull base and vertebrae: No acute fracture. No primary bone lesion or focal pathologic process. Soft tissues and spinal canal: No prevertebral fluid or swelling. No visible canal hematoma. Disc levels:  C5-6 degenerative disc disease. Upper chest: Biapical emphysematous change and subpleural scarring. Other: None IMPRESSION: 1. No acute intracranial abnormality. 2. Atrophy and chronic microvascular ischemic changes. 3. Air-fluid level in the right maxillary sinus and small amount of fluid in the left sphenoid sinus. Correlate for sinusitis. 4. No acute cervical spine abnormality. Electronically Signed   By: Annia Belt M.D.   On: 05/05/2022 19:57   CT CERVICAL SPINE WO CONTRAST  Result Date: 05/05/2022 CLINICAL DATA:  Trauma. EXAM: CT HEAD WITHOUT CONTRAST CT CERVICAL SPINE WITHOUT CONTRAST TECHNIQUE: Multidetector CT imaging of the head and cervical spine was performed following the standard protocol without intravenous contrast. Multiplanar CT image reconstructions of the cervical spine were also generated. RADIATION DOSE REDUCTION: This exam was performed according to the departmental  dose-optimization program which includes automated exposure control, adjustment of the mA and/or kV according to patient size and/or use of iterative reconstruction technique. COMPARISON:  CT head/cervical spine 09/22/2020 FINDINGS: CT HEAD FINDINGS Brain: Ventricles and sulci are prominent compatible with atrophy. Periventricular and subcortical white matter hypodensities compatible with chronic microvascular ischemic changes. No evidence for acute cortically based infarct, intracranial hemorrhage, mass lesion or mass effect. Vascular: No hyperdense vessel or unexpected calcification. Skull: Normal. Negative for fracture or focal lesion. Sinuses/Orbits: Air-fluid level right maxillary sinus. Small amount of fluid in  the left sphenoid sinus. Mastoid air cells unremarkable. Other: None CT CERVICAL SPINE FINDINGS Alignment: Minimal grade 1 anterolisthesis C3 on C4 and C4 on C5. This is likely secondary to facet degenerative changes. Skull base and vertebrae: No acute fracture. No primary bone lesion or focal pathologic process. Soft tissues and spinal canal: No prevertebral fluid or swelling. No visible canal hematoma. Disc levels:  C5-6 degenerative disc disease. Upper chest: Biapical emphysematous change and subpleural scarring. Other: None IMPRESSION: 1. No acute intracranial abnormality. 2. Atrophy and chronic microvascular ischemic changes. 3. Air-fluid level in the right maxillary sinus and small amount of fluid in the left sphenoid sinus. Correlate for sinusitis. 4. No acute cervical spine abnormality. Electronically Signed   By: Annia Beltrew  Davis M.D.   On: 05/05/2022 19:57   DG Pelvis Portable  Result Date: 05/05/2022 CLINICAL DATA:  Trauma. EXAM: PORTABLE PELVIS 1-2 VIEWS COMPARISON:  CT examination dated September 22, 2020 FINDINGS: Status post left hip arthroplasty with intact hardware. Mild right hip osteoarthritis with joint space narrowing and marginal spurring. There is no evidence of pelvic fracture or  diastasis. No pelvic bone lesions are seen. IMPRESSION: 1. No acute fracture or dislocation. 2. Status post left hip arthroplasty. Electronically Signed   By: Larose HiresImran  Ahmed D.O.   On: 05/05/2022 18:48   DG Chest Port 1 View  Result Date: 05/05/2022 CLINICAL DATA:  Patient has been falling frequently. Low O2 sats at 86% patient denies shortness of breath cough fever. EXAM: PORTABLE CHEST 1 VIEW COMPARISON:  Chest radiograph dated September 22, 2020 FINDINGS: The heart is enlarged. Atherosclerotic calcification of the aortic arch. Left lower lobe subsegmental linear atelectasis and chronic elevation of the left hemidiaphragm. Thoracic spondylosis. IMPRESSION: 1. Cardiomegaly. 2. Left lower lobe subsegmental linear atelectasis and chronic elevation of the left hemidiaphragm. Electronically Signed   By: Larose HiresImran  Ahmed D.O.   On: 05/05/2022 18:46    EKG: I independently viewed the EKG done and my findings are as followed: Atrial fibrillation with rate control  Assessment/Plan Present on Admission:  Acute respiratory failure with hypoxia  Atrial fibrillation, chronic (HCC)  Principal Problem:   Acute respiratory failure with hypoxia Active Problems:   Atrial fibrillation, chronic (HCC)   Acute bronchitis   Recurrent falls   Elevated brain natriuretic peptide (BNP) level   Hypokalemia   Hypoalbuminemia due to protein-calorie malnutrition  Acute respiratory failure with hypoxia possibly secondary to acute bronchitis Patient denies history of COPD and no COPD medication noted on med rec Continue duo nebs, Mucinex, Solu-Medrol, doxycycline. Continue Protonix to prevent steroid-induced ulcer Continue incentive spirometry and flutter valve Continue supplemental oxygen to maintain O2 sat > 92% with plan to wean patient off oxygen as tolerated (patient does not use oxygen at baseline)  Recurrent falls Continue fall precaution Continue PT/OT eval and treat  Elevated BNP r/o CHF BNP 254 Continue total  input/output, daily weights and fluid restriction Continue heart healthy diet  Echocardiogram in the morning   Hypokalemia K+ 3.3, this was replenished  Hypoalbuminemia possibly secondary to mild protein calorie malnutrition Albumin 3.1, Protein supplement will be provided  Atrial fibrillation on Eliquis Continue Cardizem and Eliquis  Goals of care Palliative care will be consulted  DVT prophylaxis: Eliquis  Code Status: Full code  Consults: None  Family Communication: None at bedside  Severity of Illness: The appropriate patient status for this patient is OBSERVATION. Observation status is judged to be reasonable and necessary in order to provide the required intensity of service to  ensure the patient's safety. The patient's presenting symptoms, physical exam findings, and initial radiographic and laboratory data in the context of their medical condition is felt to place them at decreased risk for further clinical deterioration. Furthermore, it is anticipated that the patient will be medically stable for discharge from the hospital within 2 midnights of admission.   Author: Frankey Shown, DO 05/06/2022 5:05 AM  For on call review [email or web address].

## 2022-05-06 NOTE — Progress Notes (Signed)
  Echocardiogram 2D Echocardiogram has been performed.  Marissa Turner 05/06/2022, 9:38 AM

## 2022-05-06 NOTE — Evaluation (Signed)
Occupational Therapy Evaluation Patient Details Name: Marissa Turner MRN: [id number] DOB: September 15, 1940 Today's Date: 05/06/2022   History of Present Illness Marissa Turner is a 82 y.o. 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  She fell last night and strike her head, so her sister convinced her to go to an urgent care where she was eventually directed to go to the ED for further evaluation and management.  Patient states that the urgent care was concerned of fluid behind her eardrums, however patient denied any ear pain or use of alcohol.  She denies headache, blurry vision. (per DO)   Clinical Impression   Pt agreeable to OT and PT co-evaluation. Pt unsteady in standing needing min G to min A for initial transfer to chair, but pt then use RW with much better stability and ability to ambulate in hall with supervision to mod I assist. Pt would benefit from home safety assessment from a home health OT due to report of fall. Pt is not recommended for further acute OT services and will be discharged to care of nursing staff for remaining length of stay.       Recommendations for follow up therapy are one component of a multi-disciplinary discharge planning process, led by the attending physician.  Recommendations may be updated based on patient status, additional functional criteria and insurance authorization.   Assistance Recommended at Discharge PRN  Patient can return home with the following A little help with walking and/or transfers    Functional Status Assessment  Patient has had a recent decline in their functional status and demonstrates the ability to make significant improvements in function in a reasonable and  predictable amount of time.  Equipment Recommendations  None recommended by OT    Recommendations for Other Services       Precautions / Restrictions Precautions Precautions: Fall Restrictions Weight Bearing Restrictions: No      Mobility Bed Mobility Overal bed mobility: Modified Independent                  Transfers Overall transfer level: Needs assistance Equipment used: Rolling walker (2 wheels) Transfers: Sit to/from Stand, Bed to chair/wheelchair/BSC Sit to Stand: Min guard, Min assist     Step pivot transfers: Min assist, Min guard     General transfer comment: Mod I with RW; min G to min A without RW.      Balance Overall balance assessment: Needs assistance Sitting-balance support: No upper extremity supported, Feet supported Sitting balance-Leahy Scale: Good Sitting balance - Comments: seated at EOB   Standing balance support: During functional activity, Bilateral upper extremity supported Standing balance-Leahy Scale: Fair Standing balance comment: fair to good with RW; poor to fair without RW                           ADL either performed or assessed with clinical judgement   ADL Overall ADL's : Modified independent                                       General ADL Comments: Good seated ADL's ; mod I for standing ADL's due  to use of RW. Without RW pt was needing min G to min A when standing/transferring.     Vision Baseline Vision/History: 1 Wears glasses Ability to See in Adequate Light: 0 Adequate Patient Visual Report: No change from baseline Vision Assessment?: No apparent visual deficits     Perception Perception Perception Tested?: No   Praxis Praxis Praxis tested?: Not tested    Pertinent Vitals/Pain Pain Assessment Pain Assessment: No/denies pain     Hand Dominance Left   Extremity/Trunk Assessment Upper Extremity Assessment Upper Extremity Assessment: Generalized weakness   Lower  Extremity Assessment Lower Extremity Assessment: Defer to PT evaluation   Cervical / Trunk Assessment Cervical / Trunk Assessment: Normal   Communication Communication Communication: No difficulties   Cognition Arousal/Alertness: Awake/alert Behavior During Therapy: WFL for tasks assessed/performed Overall Cognitive Status: Within Functional Limits for tasks assessed                                                        Home Living Family/patient expects to be discharged to:: Private residence Living Arrangements: Spouse/significant other Available Help at Discharge: Family;Friend(s);Available 24 hours/day Type of Home: House Home Access: Stairs to enter [employer] of Steps: 4 Entrance Stairs-Rails: Right;Left;Can reach both Home Layout: One level     Bathroom Shower/Tub: Chief Strategy Officer: Standard Bathroom Accessibility: Yes How Accessible: Accessible via walker Home Equipment: Grab bars - toilet;Grab bars - tub/shower;Cane - single Librarian, academic (2 wheels)          Prior Functioning/Environment Prior Level of Function : Independent/Modified Independent             Mobility Comments: 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                                Co-evaluation PT/OT/SLP Co-Evaluation/Treatment: Yes Reason for Co-Treatment: To address functional/ADL transfers   OT goals addressed during session: ADL's and self-care      AM-PAC OT "6 Clicks" Daily Activity     Outcome Measure Help from another person eating meals?: None Help from another person taking care of personal grooming?: None Help from another person toileting, which includes using toliet, bedpan, or urinal?: None Help from another person bathing (including washing, rinsing, drying)?: None Help from another person to put on and taking off regular upper body clothing?:  None Help from another person to put on and taking off regular lower body clothing?: None 6 Click Score: 24   End of Session Equipment Utilized During Treatment: Rolling walker (2 wheels) Nurse Communication: Mobility status  Activity Tolerance: Patient tolerated treatment well Patient left: in chair;with call bell/phone within reach  OT Visit Diagnosis: Unsteadiness on feet (R26.81);Other abnormalities of gait and mobility (R26.89);Muscle weakness (generalized) (M62.81)                Time: 1610-9604 OT Time Calculation (min): 21 min Charges:  OT General Charges $OT Visit: 1 Visit OT Evaluation $OT Eval Low Complexity: 1 Low  Marissa Turner OT, MOT   Danie Chandler 05/06/2022, 10:37 AM

## 2022-05-06 NOTE — Evaluation (Signed)
Physical Therapy Evaluation Patient Details Name: Marissa Turner MRN: [id number] DOB: [birth year] Today's Date: 05/06/2022  History of Present Illness  Marissa Turner is a 82 y.o. 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  She fell last night and strike her head, so her sister convinced her to go to an urgent care where she was eventually directed to go to the ED for further evaluation and management.  Patient states that the urgent care was concerned of fluid behind her eardrums, however patient denied any ear pain or use of alcohol.  She denies headache, blurry vision.   Clinical Impression  Patient demonstrates good return for sitting up at bedside, but had to lean on armrest of chair during transfer to avoid falling due to BLE weakness, required use of RW for gait training with good carryover for walking in room/hallway without loss of balance and limited mostly due to mild SOB with SpO2 dropping from 95% to 86% while on room air.  Patient tolerated sitting up in chair after therapy with SpO2 at 95% on room air - RN aware.  Patient will benefit from continued skilled physical therapy in hospital and recommended venue below to increase strength, balance, endurance for safe ADLs and gait.          Recommendations for follow up therapy are one component of a multi-disciplinary discharge planning process, led by the attending physician.  Recommendations may be updated based on patient status, additional functional criteria and insurance authorization.  Follow Up Recommendations       Assistance Recommended at Discharge Set up Supervision/Assistance  Patient can return home with the following  A little help with walking  and/or transfers;A little help with bathing/dressing/bathroom;Help with stairs or ramp for entrance;Assistance with cooking/housework    Equipment Recommendations None recommended by PT  Recommendations for Other Services       Functional Status Assessment Patient has had a recent decline in their functional status and demonstrates the ability to make significant improvements in function in a reasonable and predictable amount of time.     Precautions / Restrictions Precautions Precautions: Fall Restrictions Weight Bearing Restrictions: No      Mobility  Bed Mobility Overal bed mobility: Modified Independent                  Transfers Overall transfer level: Needs assistance Equipment used: Rolling walker (2 wheels), None, 1 person hand held assist Transfers: Sit to/from Stand, Bed to chair/wheelchair/BSC Sit to Stand: Min guard, Min assist   Step pivot transfers: Min assist, Min guard       General transfer comment: unsteady labored movement with near loss of balance during transfer to chair without AD, required use of RW for safety    Ambulation/Gait Ambulation/Gait assistance: Supervision, Min guard Gait Distance (Feet): 80 Feet Assistive device: Rolling walker (2 wheels) Gait Pattern/deviations: Decreased step length - right, Decreased step length - left, Decreased stride length Gait velocity: decreased     General Gait Details: slightly labored cadence with good return for using RW during ambulation in room/hallway without loss of balance, limited mostly due to fatigue with SpO2 dropping rom 95% to 86% while on room air  Stairs  Wheelchair Mobility    Modified Rankin (Stroke Patients Only)       Balance Overall balance assessment: Needs assistance Sitting-balance support: Feet supported, No upper extremity supported Sitting balance-Leahy Scale: Good Sitting balance - Comments: seated at EOB   Standing balance support: During  functional activity, No upper extremity supported Standing balance-Leahy Scale: Poor Standing balance comment: fair/good using RW                             Pertinent Vitals/Pain Pain Assessment Pain Assessment: No/denies pain    Home Living Family/patient expects to be discharged to:: Private residence Living Arrangements: Spouse/significant other Available Help at Discharge: Family;Friend(s);Available 24 hours/day Type of Home: House Home Access: Stairs to enter Entrance Stairs-Rails: Right;Left;Can reach both Entrance Stairs-Number of Steps: 4   Home Layout: One level Home Equipment: Grab bars - toilet;Grab bars - tub/shower;Cane - single Librarian, academicpoint;Rolling Walker (2 wheels)      Prior Function Prior Level of Function : Independent/Modified Independent             Mobility Comments: Community ambulator with PRN use of cane; drives ADLs Comments: Indpendent ADL's and IADL's.     Hand Dominance   Dominant Hand: Left    Extremity/Trunk Assessment   Upper Extremity Assessment Upper Extremity Assessment: Defer to OT evaluation    Lower Extremity Assessment Lower Extremity Assessment: Generalized weakness    Cervical / Trunk Assessment Cervical / Trunk Assessment: Normal  Communication   Communication: No difficulties  Cognition Arousal/Alertness: Awake/alert Behavior During Therapy: WFL for tasks assessed/performed Overall Cognitive Status: Within Functional Limits for tasks assessed                                          General Comments      Exercises     Assessment/Plan    PT Assessment Patient needs continued PT services  PT Problem List Decreased strength;Decreased activity tolerance;Decreased balance;Decreased mobility       PT Treatment Interventions DME instruction;Gait training;Stair training;Functional mobility training;Therapeutic activities;Therapeutic exercise;Patient/family education;Balance training    PT  Goals (Current goals can be found in the Care Plan section)  Acute Rehab PT Goals Patient Stated Goal: return home with family to assist PT Goal Formulation: With patient Time For Goal Achievement: 05/09/22 Potential to Achieve Goals: Good    Frequency Min 3X/week     Co-evaluation PT/OT/SLP Co-Evaluation/Treatment: Yes Reason for Co-Treatment: To address functional/ADL transfers PT goals addressed during session: Mobility/safety with mobility;Balance;Proper use of DME OT goals addressed during session: ADL's and self-care       AM-PAC PT "6 Clicks" Mobility  Outcome Measure Help needed turning from your back to your side while in a flat bed without using bedrails?: None Help needed moving from lying on your back to sitting on the side of a flat bed without using bedrails?: None Help needed moving to and from a bed to a chair (including a wheelchair)?: A Little Help needed standing up from a chair using your arms (e.g., wheelchair or bedside chair)?: A Little Help needed to walk in hospital room?: A Little Help needed climbing 3-5 steps with a railing? : A Little 6 Click Score: 20    End of Session   Activity Tolerance: Patient tolerated treatment well;Patient limited by fatigue Patient left: in chair;with call bell/phone within reach;with nursing/sitter in room  Nurse Communication: Mobility status PT Visit Diagnosis: Unsteadiness on feet (R26.81);Other abnormalities of gait and mobility (R26.89);Muscle weakness (generalized) (M62.81)    Time: 0937-1000 PT Time Calculation (min) (ACUTE ONLY): 23 min   Charges:   PT Evaluation $PT Eval Moderate Complexity: 1 Mod PT Treatments $Therapeutic Activity: 23-37 mins        11:54 AM, 05/06/22 Ocie Bob, MPT Physical Therapist with [facility] 336 [phone] office [phone] mobile phone

## 2022-05-06 NOTE — Consult Note (Signed)
Consultation Note Date: 05/06/2022   Patient Name: Marissa Turner  DOB: [birth year]  MRN: [id number]  Age / Sex: 82 y.o., female  PCP: Benita Stabile, MD Referring Physician: Burnadette Pop, MD  Reason for Consultation:  goals of care  HPI/Patient Profile: 82 y.o. female  with past medical history of a fib, COPD, NSCLC- s/p lobectomy in remission, B cell lymphoma in remission admitted on 05/05/2022 with falls, persistent cough. She was hypoxic in ED- started on 2L [place]- O2 stable now on room air. Elevated BNP - echo pending. Troponin normal. Chest xray and CT chest clear. Being treated for acute bronchitis. Palliative consulted for goals of care.   Primary Decision Maker PATIENT - if she were unable to make decisions her spouse would be surrogate  Discussion: I have reviewed medical records including Care Everywhere, progress notes from this and prior admissions, labs and imaging, discussed with RN.  On evaluation patient is awake, alert and oriented. Met with patient and spouse at bedside.   I introduced Palliative Medicine as specialized medical care for people living with serious illness. It focuses on providing relief from the symptoms and stress of a serious illness. The goal is to improve quality of life for both the patient and the family.  As far as functional and nutritional status - prior to admission she is independent with ADL's and iADL's. She drives. She assists her friend with getting groceries. She provides care for her great grandchildren at times. She has had a few falls recently d/t feeling "wobbly".   I attempted to elicit values and goals of care important to the patient.  Being able to be independent and spend time with her family are her main GOC.   Advance directives, concepts specific to code status were discussed. She does not have advanced directives but is clear that she would not want to  be on life support if she were nearing end of life. We discussed code status- if she were to be found with  no pulse and not breathing she would not want attempts made at resuscitation. She and spouse agree with DNR order.   Discussed with patient/family the importance of continued conversation with family and the medical providers regarding overall plan of care and treatment options, ensuring decisions are within the context of the patient's values and GOCs.     SUMMARY OF RECOMMENDATIONS -DNR- limited interventions, rehospitalization ok  -She feels stable and is eager for discharge home    Code Status/Advance Care Planning: DNR   Prognosis:   Unable to determine  Discharge Planning:  home  Primary Diagnoses: Present on Admission:  Acute respiratory failure with hypoxia  Atrial fibrillation, chronic (HCC)   Review of Systems  Respiratory:  Positive for cough.     Physical Exam Vitals and nursing note reviewed.  Constitutional:      General: She is not in acute distress.    Appearance: She is not ill-appearing.  Pulmonary:     Effort: Pulmonary effort is normal.  Neurological:  Mental Status: She is alert and oriented to person, place, and time.  Psychiatric:        Mood and Affect: Mood normal.        Behavior: Behavior normal.        Thought Content: Thought content normal.        Judgment: Judgment normal.     Vital Signs: BP 111/71 (BP Location: Right Arm)   Pulse 78   Temp 97.9 F (36.6 C) (Oral)   Resp (!) 21   Ht 5' (1.524 m)   Wt 68.5 kg   SpO2 96%   BMI 29.49 kg/m  Pain Scale: 0-10   Pain Score: 0-No pain   SpO2: SpO2: 96 % O2 Device:SpO2: 96 % O2 Flow Rate: .O2 Flow Rate (L/min): 3 L/min  IO: Intake/output summary: No intake or output data in the 24 hours ending 05/06/22 1319  LBM: Last BM Date : 05/05/22 Baseline Weight: Weight: 68.5 kg Most recent weight: Weight: 68.5 kg       Thank you for this consult. Palliative medicine will  continue to follow and assist as  Greater than 50%  of this time was spent counseling and coordinating care related to the above assessment and plan.  Signed by: Ocie Bob, AGNP-C Palliative Medicine    Please contact Palliative Medicine Team phone at [phone] for questions and concerns.  For individual provider: See Loretha Stapler

## 2022-05-06 NOTE — TOC Initial Note (Signed)
Transition of Care [facility]) - Initial/Assessment Note    Patient Details  Name: Marissa Turner MRN: [id number] Date of Birth: [birth year]  Transition of [facility]) CM/SW Contact:    Karn Cassis, LCSW Phone Number: 05/06/2022, 10:49 AM  Clinical Narrative:  Pt admitted due to acute respiratory failure with hypoxia. Assessment completed with pt, husband, and daughter-in-law, Marissa Turner. Pt reports she is fairly independent with ADLs and still drives. She has a walker and cane if needed, but typically ambulates without assistive device. PT/OT evaluated pt and recommend home health. Discussed with pt/family and they are agreeable with no preference on agency. TOC will refer. Requested MD put in home health orders.               Expected Discharge Plan: Home w Home Health Services Barriers to Discharge: Continued Medical Work up   Patient Goals and CMS Choice Patient states their goals for this hospitalization and ongoing recovery are:: return home   Choice offered to / list presented to : Patient, Spouse [place] ownership interest in [facility].provided to::  (n/a)    Expected Discharge Plan and Services In-house Referral: Clinical Social Work   Post Acute Care Choice: Home Health Living arrangements for the past 2 months: Single Family Home                                      Prior Living Arrangements/Services Living arrangements for the past 2 months: Single Family Home Lives with:: Spouse Patient language and need for interpreter reviewed:: Yes Do you feel safe going back to the place where you live?: Yes      Need for Family Participation in Patient Care: No (Comment)   Current home services: DME (cane, walker) Criminal Activity/Legal Involvement Pertinent to Current Situation/Hospitalization: No - Comment as needed  Activities of Daily Living Home Assistive Devices/Equipment: Cane (specify quad or straight), Eyeglasses, Dentures (specify type),  Shower chair without back ADL Screening (condition at time of admission) Patient's cognitive ability adequate to safely complete daily activities?: Yes Is the patient deaf or have difficulty hearing?: No Does the patient have difficulty seeing, even when wearing glasses/contacts?: No Does the patient have difficulty concentrating, remembering, or making decisions?: No Patient able to express need for assistance with ADLs?: Yes Does the patient have difficulty dressing or bathing?: No Independently performs ADLs?: Yes (appropriate for developmental age) Does the patient have difficulty walking or climbing stairs?: No Weakness of Legs: None Weakness of Arms/Hands: None  Permission Sought/Granted                  Emotional Assessment     Affect (typically observed): Appropriate Orientation: : Oriented to Self, Oriented to Place, Oriented to  Time, Oriented to Situation Alcohol / Substance Use: Not Applicable Psych Involvement: No (comment)  Admission diagnosis:  COPD exacerbation [J44.1] Acute respiratory failure with hypoxia [J96.01] Patient Active Problem List   Diagnosis Date Noted   Acute respiratory failure with hypoxia 05/06/2022   Acute bronchitis 05/06/2022   Recurrent falls 05/06/2022   Elevated brain natriuretic peptide (BNP) level 05/06/2022   Hypokalemia 05/06/2022   Hypoalbuminemia due to protein-calorie malnutrition 05/06/2022   Erroneous encounter - disregard 11/26/2021   Primary osteoarthritis of right knee 07/25/2019   OA (osteoarthritis) of hip 10/18/2018   Squamous cell lung cancer, left 04/20/2018   Hamartoma of lung 04/20/2018   S/P Left Lower  Lobectomy, Wedge Resection LUL 04/15/2018   Positive colorectal cancer screening using Cologuard test 03/22/2018   Depression 07/25/2015   Knee pain, right 03/22/2015   Iron deficiency 10/17/2013   Lymphoma malignant, 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 [address] Phone: [phone] Fax: [phone]  [facility] [id number] - [place], [place] - [address] AT [facility] [address] [place] [address] Phone: [phone] Fax: [phone]     Social Determinants of Health (SDOH) Social History: SDOH Screenings   Food Insecurity: No Food Insecurity (05/06/2022)  Housing: Low Risk  (05/06/2022)  Transportation Needs: No Transportation Needs (05/06/2022)  Utilities: Not At Risk (05/06/2022)  Tobacco Use: Medium Risk (05/05/2022)   SDOH Interventions:     Readmission Risk Interventions     No data to display

## 2022-05-06 NOTE — Progress Notes (Addendum)
.  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  On.  Since she was hypoxic, needing supplemental oxygen, blood pressure was soft.  Lab work showed WBC of 14.5, potassium of 3.3, elevated BNP.  CT chest with contrast was negative for PE but showed features of emphysema.  CT head/CT cervical spine did not show any acute findings.  Patient put on supplemental oxygen.  Started on antibiotics for suspicion for bronchitis. Patient seen and examined at bedside in the emergency department this morning.  Hemodynamically stable during my evaluation.  Lungs are clear to auscultation.  She was on 2 L of oxygen.  Had some cough but denies any shortness of breath.  Assessment and plan:   Acute hypoxic respiratory failure: Hypoxic on presentation.  Currently on 2 L.  Not on oxygen at home.  Thought to be secondary to acute bronchitis.  Continue bronchodilators, Mucinex, steroids, antibiotic Continue incentive spirometer, flutter valve.  Not on oxygen at home.  Will try to wean. Complains of cough with white phlegm.  Recurrent falls :PT/OT consulted.  Recommend home health  Elevated BNP: BNP of 254.  Echo ordered.  Monitor daily weight, input/output  Hypokalemia: Being supplemented and  monitored  Permanent A-fib : On Cardizem for rate control, on Eliquis for anticoagulation  Called and discussed with husband and daughter in law on phone thi afternoon

## 2022-05-07 ENCOUNTER — Other Ambulatory Visit: Payer: Self-pay

## 2022-05-07 DIAGNOSIS — J9601 Acute respiratory failure with hypoxia: Secondary | ICD-10-CM | POA: Diagnosis not present

## 2022-05-07 LAB — CBC
HCT: 38.6 % (ref 36.0–46.0)
Hemoglobin: 12 g/dL (ref 12.0–15.0)
MCH: 28.2 pg (ref 26.0–34.0)
MCHC: 31.1 g/dL (ref 30.0–36.0)
MCV: 90.8 fL (ref 80.0–100.0)
Platelets: 393 10*3/uL (ref 150–400)
RBC: 4.25 MIL/uL (ref 3.87–5.11)
RDW: 15.5 % (ref 11.5–15.5)
WBC: 28.4 10*3/uL — ABNORMAL HIGH (ref 4.0–10.5)
nRBC: 0 % (ref 0.0–0.2)

## 2022-05-07 LAB — BASIC METABOLIC PANEL
Anion gap: 7 (ref 5–15)
BUN: 14 mg/dL (ref 8–23)
CO2: 30 mmol/L (ref 22–32)
Calcium: 8.5 mg/dL — ABNORMAL LOW (ref 8.9–10.3)
Chloride: 99 mmol/L (ref 98–111)
Creatinine, Ser: 0.6 mg/dL (ref 0.44–1.00)
GFR, Estimated: >60 mL/min (ref >60)
Glucose, Bld: 136 mg/dL — ABNORMAL HIGH (ref 70–99)
Potassium: 4.3 mmol/L (ref 3.5–5.1)
Sodium: 136 mmol/L (ref 135–145)

## 2022-05-07 LAB — PROCALCITONIN: Procalcitonin: 0.16 ng/mL

## 2022-05-07 MED ORDER — DOXYCYCLINE HYCLATE 100 MG PO TABS: 100.0000 mg | ORAL_TABLET | Freq: Two times a day (BID) | ORAL | 0 refills | Status: AC

## 2022-05-07 MED ORDER — PANTOPRAZOLE SODIUM 40 MG PO TBEC: 40.0000 mg | DELAYED_RELEASE_TABLET | Freq: Every day | ORAL | 0 refills | Status: DC

## 2022-05-07 MED ORDER — PREDNISONE 20 MG PO TABS: 40.0000 mg | ORAL_TABLET | Freq: Every day | ORAL | 0 refills | Status: AC

## 2022-05-07 NOTE — TOC Transition Note (Addendum)
Transition of Care [facility]) - CM/SW Discharge Note   Patient Details  Name: Marissa Turner MRN: [id number] Date of Birth: 05/30/1940  Transition of Care [facility]) CM/SW Contact:  Villa Herb, LCSWA Phone Number: 05/07/2022, 11:03 AM   Clinical Narrative:    CSW updated that Morrie Sheldon with [facility] is able to accepts the pts [facility] referral. Pts HH orders have been placed by MD. CSW updated Morrie Sheldon with plan for D/C. H agency info added to AVS for pt. Pt qualifies for home O2. TOC requested [facility] DME orders. TOC spoke with pt who is agreeable to home O2 setup with [facility]. TOC updated Morrie Sheldon with [facility], tank will be delivered to pts room. TOC signing off.   Final next level of care: Home w Home Health Services Barriers to Discharge: Barriers Resolved   Patient Goals and CMS Choice CMS Medicare.gov Compare Post Acute Care list provided to:: Patient Choice offered to / list presented to : Patient, Spouse  Discharge Placement                         Discharge Plan and Services Additional resources added to the After Visit Summary for   In-house Referral: Clinical Social Work   Post Acute Care Choice: Home Health                    HH Arranged: PT, OT [facility] Agency: [facility] Date [facility] Agency Contacted: 05/07/22   Representative spoke with at [facility] Agency: Morrie Sheldon  Social Determinants of Health (SDOH) Interventions SDOH Screenings   Food Insecurity: No Food Insecurity (05/06/2022)  Housing: Low Risk  (05/06/2022)  Transportation Needs: No Transportation Needs (05/06/2022)  Utilities: Not At Risk (05/06/2022)  Tobacco Use: Medium Risk (05/05/2022)     Readmission Risk Interventions     No data to display

## 2022-05-07 NOTE — Progress Notes (Signed)
Mobility Specialist Progress Note:   05/07/22 1005  Mobility  Activity Ambulated with assistance in hallway  Level of Assistance Standby assist, set-up cues, supervision of patient - no hands on  Assistive Device Front wheel walker  Distance Ambulated (ft) 280 ft  Activity Response Tolerated well  Mobility Referral Yes  $Mobility charge 1 Mobility   Pt agreeable to mobility session. Tolerated well, asx thoughout. SpO2 89% on RA during ambulation. Returned pt to chair, husband in room.   Feliciana Rossetti Mobility Specialist Please contact via Special educational needs teacher or  Rehab office at [phone]

## 2022-05-07 NOTE — Progress Notes (Addendum)
SATURATION QUALIFICATIONS: (This note is used to comply with regulatory documentation for home oxygen)  Patient Saturations on Room Air at Rest = 84%  Patient Saturations on Room Air while Ambulating = 80%  Patient Saturations on 3 Liters of oxygen while Ambulating = 91%  Please briefly explain why patient needs home oxygen:

## 2022-05-07 NOTE — Care Management Important Message (Signed)
Important Message  Patient Details  Name: Marissa Turner MRN: [id number] Date of Birth: Aug 13, 1940   Medicare Important Message Given:  N/A - LOS <3 / Initial given by admissions     Corey Harold 05/07/2022, 2:32 PM

## 2022-05-07 NOTE — Discharge Summary (Signed)
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  Lab work showed WBC of 14.5, potassium of 3.3, elevated BNP.  CT chest with contrast was negative for PE but showed features of emphysema.  CT head/CT cervical spine did not show any acute findings.  Patient put on supplemental oxygen.  Started on antibiotics for suspicion for bronchitis.  She feels much better today and desperately wants to go home.  She desaturated on room air on ambulation so she  qualified for home oxygen.  Most likely she has underlying COPD which was not diagnosed yet.  Medically stable for discharge home today with oxygen.  Following problems were addressed during the hospitalization:  Acute hypoxic respiratory failure: Hypoxic on presentation.    Not on oxygen at home.  Thought to be secondary to acute COPD exacerbation/bronchitis.  CT angiogram did not show pneumonia but showed features of emphysema. She is clinically improved.  Feels much better.  Will continue steroid on discharge.  Continue albuterol inhaler on discharge.  Also on doxycycline.  Will recommend to follow-up with pulmonology as an outpatient for PFT.  Outpatient referral sent to [facility]  History of lung cancer: History of squamous cell carcinoma of left lung status post lobectomy.  Currently in remission.  CT angiogram did not show any strong findings of malignancy.   Showed enlarged mentioned lymph nodes which could be reactive.  We recommend follow-up CT as an outpatient in 3 to 6 months.   Recurrent falls :PT/OT consulted.  Recommend home health   Elevated BNP: BNP of 254.  Echo showed normal EF, indeterminate diastolic parameters.  Currently she looks euvolemic.   Hypokalemia: Supplemented and corrected   Permanent A-fib : On Cardizem for rate control, on Eliquis for anticoagulation  Leukocytosis: This is most likely secondary to steroids.  Procalcitonin checked and it is nonreassuring.  Will ask her to follow-up with her PCP in a week and do a CBC just in a week to check her white cell count.   Discharge Diagnoses:  Principal Problem:   Acute respiratory failure with hypoxia Active Problems:   Atrial fibrillation, chronic (HCC)   Acute bronchitis   Recurrent falls   Elevated brain natriuretic peptide (BNP) level   Hypokalemia   Hypoalbuminemia due to protein-calorie malnutrition   Acute hypoxic respiratory failure    Discharge Instructions  Discharge Instructions     Ambulatory referral to Pulmonology   Complete by: As directed    Reason for referral: Asthma/COPD   Diet - low sodium heart healthy   Complete by: As directed    Discharge instructions   Complete by: As directed    1)Please take prescribed medications as instructed 2)Follow up with your PCP in a week.Do a CBC test in a week to check your white cell count 3)Follow up with pulmonology as an outpatient for pulmonary function test. 4)Do a follow up CT scan of the chest in 3 to 6 months   Increase activity slowly   Complete by: As directed  Allergies as of 05/07/2022       Reactions   Estrogens Swelling   edema   Codeine Swelling, Rash   Penicillins Other (See Comments)   Break out in welts. Did it involve swelling of the face/tongue/throat, SOB, or low BP? No Did it involve sudden or severe rash/hives, skin peeling, or any reaction on the inside of your  mouth or nose? No Did you need to seek medical attention at a hospital or doctor's office? No When did it last happen?  Childhood allergy      If all above answers are "NO", may proceed with cephalosporin use. Tolerated Cephalosporin Date: 07/26/19.   Diphenhydramine Hcl Other (See Comments)   hallucinations         Medication List     TAKE these medications    acetaminophen 650 MG CR tablet Commonly known as: TYLENOL Take 1,300 mg by mouth every evening.   albuterol 108 (90 Base) MCG/ACT inhaler Commonly known as: VENTOLIN HFA Inhale 1-2 puffs into the lungs every 4 (four) hours as needed for wheezing or shortness of breath.   ALPRAZolam 1 MG tablet Commonly known as: XANAX Take 1 tablet (1 mg total) by mouth 3 (three) times daily as needed for anxiety. What changed: when to take this   apixaban 5 MG Tabs tablet Commonly known as: Eliquis TAKE ONE TABLET (  TOTAL) BY MOUTH TWOTIMES DAILY What changed:  how much to take how to take this when to take this additional instructions   citalopram 20 MG tablet Commonly known as: CELEXA Take 20 mg by mouth daily.   diltiazem 240 MG 24 hr capsule Commonly known as: CARDIZEM CD Take 1 capsule (240 mg total) by mouth daily.   doxycycline 100 MG tablet Commonly known as: VIBRA-TABS Take 1 tablet (100 mg total) by mouth every 12 (twelve) hours for 4 days. Start taking on: May 08, 2022   pantoprazole 40 MG tablet Commonly known as: PROTONIX Take 1 tablet (40 mg total) by mouth daily for 14 days. Start taking on: May 08, 2022   predniSONE 20 MG tablet Commonly known as: DELTASONE Take 2 tablets (40 mg total) by mouth daily with breakfast for 4 days. Start taking on: May 08, 2022   Tessalon Perles 100 MG capsule Generic drug: benzonatate Take 100 mg by mouth 3 (three) times daily as needed for cough.               Durable Medical Equipment  (From admission, onward)           Start     Ordered    05/07/22 1117  For home use only DME oxygen  Once       Question Answer Comment  Length of Need Lifetime   Mode or (Route) Nasal cannula   Liters per Minute 3   Frequency Continuous (stationary and portable oxygen unit needed)   Oxygen delivery system Gas      05/07/22 1116            Follow-up Information     [facility] Follow up.   Why: [facility] staff will call you regarding in home visits        Margo Aye, Kathleene Hazel, MD. Schedule an appointment as soon as possible for a visit in 1 week(s).   Specialty: Internal Medicine Contact information: [address] [place] [address] [phone]                Allergies  Allergen Reactions   Estrogens Swelling    edema   Codeine Swelling and Rash   Penicillins Other (See Comments)    Break out in welts. Did it involve swelling of the face/tongue/throat, SOB, or low BP? No Did it involve sudden or severe rash/hives, skin peeling, or any reaction on the inside of your mouth or nose? No Did you need to seek medical attention at a hospital or doctor's office? No When did it last happen?  Childhood allergy      If all above answers are "NO", may proceed with cephalosporin use.  Tolerated Cephalosporin Date: 07/26/19.     Diphenhydramine Hcl Other (See Comments)    hallucinations     Consultations: None   Procedures/Studies: ECHOCARDIOGRAM COMPLETE  Result Date: 05/06/2022    ECHOCARDIOGRAM REPORT   Patient Name:   NILANI HUGILL Date of Exam: 05/06/2022 Medical Rec #:  [id number]      Height:       60.0 in Accession #:    [id number]     Weight:       151.0 lb Date of Birth:  [birth year]      BSA:          1.656 m Patient Age:    81 years       BP:           113/67 mmHg Patient Gender: F              HR:           77 bpm. [facility] Procedure: 2D Echo, Cardiac Doppler and Color Doppler Indications:    CHF-Acute Systolic I50.21  History:        Patient has no prior history of  Echocardiogram examinations.                 Arrythmias:Atrial Fibrillation; Risk Factors:Former Smoker.  Sonographer:    Aron Baba Referring Phys: [id number] OLADAPO ADEFESO  Sonographer Comments: Image acquisition challenging due to patient body habitus and Image acquisition challenging due to respiratory motion. IMPRESSIONS  1. Left ventricular ejection fraction, by estimation, is 60 to 65%. The left ventricle has normal function. The left ventricle has no regional wall motion abnormalities. Left ventricular diastolic parameters are indeterminate.  2. Right ventricular systolic function is normal. The right ventricular size is normal. There is mildly elevated pulmonary artery systolic pressure.  3. Left atrial size was severely dilated.  4. Right atrial size was severely dilated.  5. The mitral valve is abnormal. Trivial mitral valve regurgitation. No evidence of mitral stenosis.  6. The tricuspid valve is abnormal.  7. The aortic valve is tricuspid. There is mild calcification of the aortic valve. There is mild thickening of the aortic valve. Aortic valve regurgitation is not visualized. No aortic stenosis is present.  8. Cannot exclude a small PFO with mild left to right shunt  9. The inferior vena cava is dilated in size with >50% respiratory variability, suggesting right atrial pressure of 8 mmHg. FINDINGS  Left Ventricle: Left ventricular ejection fraction, by estimation, is 60 to 65%. The left ventricle has normal function. The left ventricle has no regional wall motion abnormalities. The left ventricular internal cavity size was normal in size. There is  borderline left ventricular hypertrophy. Left ventricular diastolic parameters are indeterminate. Right Ventricle: The right ventricular size is normal. Right vetricular wall thickness was not well visualized. Right ventricular systolic function is normal. There is mildly elevated pulmonary artery systolic  pressure. The tricuspid regurgitant velocity  is  2.77 m/s, and with an assumed right atrial pressure of 8 mmHg, the estimated right ventricular systolic pressure is 38.7 mmHg. Left Atrium: Left atrial size was severely dilated. Right Atrium: Right atrial size was severely dilated. Pericardium: There is no evidence of pericardial effusion. Mitral Valve: The mitral valve is abnormal. There is mild thickening of the mitral valve leaflet(s). There is mild calcification of the mitral valve leaflet(s). Mild mitral annular calcification. Trivial mitral valve regurgitation. No evidence of mitral valve stenosis. Tricuspid Valve: The tricuspid valve is abnormal. Tricuspid valve regurgitation is mild . No evidence of tricuspid stenosis. Aortic Valve: The aortic valve is tricuspid. There is mild calcification of the aortic valve. There is mild thickening of the aortic valve. There is mild aortic valve annular calcification. Aortic valve regurgitation is not visualized. No aortic stenosis  is present. Aortic valve mean gradient measures 3.6 mmHg. Aortic valve peak gradient measures 7.5 mmHg. Aortic valve area, by VTI measures 2.21 cm. Pulmonic Valve: The pulmonic valve was not well visualized. Pulmonic valve regurgitation is not visualized. No evidence of pulmonic stenosis. Aorta: The aortic root is normal in size and structure. Venous: The inferior vena cava is dilated in size with greater than 50% respiratory variability, suggesting right atrial pressure of 8 mmHg. IAS/Shunts: Cannot exclude a small PFO.  LEFT VENTRICLE PLAX 2D LVIDd:         4.20 cm   Diastology LVIDs:         2.90 cm   LV e' medial:    7.40 cm/s LV PW:         1.00 cm   LV E/e' medial:  13.9 LV IVS:        0.80 cm   LV e' lateral:   13.10 cm/s LVOT diam:     1.90 cm   LV E/e' lateral: 7.9 LV SV:         55 LV SV Index:   33 LVOT Area:     2.84 cm  RIGHT VENTRICLE RV S prime:     12.30 cm/s TAPSE (M-mode): 1.7 cm LEFT ATRIUM              Index        RIGHT ATRIUM           Index LA diam:        5.70 cm   3.44 cm/m   RA Area:     23.70 cm LA Vol (A2C):   115.0 ml 69.43 ml/m  RA Volume:   76.00 ml  45.88 ml/m LA Vol (A4C):   124.0 ml 74.86 ml/m LA Biplane Vol: 122.0 ml 73.65 ml/m  AORTIC VALVE AV Area (Vmax):    2.11 cm AV Area (Vmean):   2.14 cm AV Area (VTI):     2.21 cm AV Vmax:           136.82 cm/s AV Vmean:          86.907 cm/s AV VTI:            0.250 m AV Peak Grad:      7.5 mmHg AV Mean Grad:      3.6 mmHg LVOT Vmax:         102.00 cm/s LVOT Vmean:        65.500 cm/s LVOT VTI:          0.195 m LVOT/AV VTI ratio: 0.78  AORTA Ao Root diam: 3.60 cm Ao Asc  diam:  3.40 cm MITRAL VALVE                TRICUSPID VALVE MV Area (PHT): 3.54 cm     TR Peak grad:   30.7 mmHg MV Decel Time: 214 msec     TR Vmax:        277.00 cm/s MV E velocity: 103.00 cm/s                             SHUNTS                             Systemic VTI:  0.20 m                             Systemic Diam: 1.90 cm Dina Rich MD Electronically signed by Dina Rich MD Signature Date/Time: 05/06/2022/12:38:42 PM    Final    CT Angio Chest PE W and/or Wo Contrast  Result Date: 05/05/2022 CLINICAL DATA:  Frequent fall low oxygen shortness of breath and fever EXAM: CT ANGIOGRAPHY CHEST WITH CONTRAST TECHNIQUE: Multidetector CT imaging of the chest was performed using the standard protocol during bolus administration of intravenous contrast. Multiplanar CT image reconstructions and MIPs were obtained to evaluate the vascular anatomy. RADIATION DOSE REDUCTION: This exam was performed according to the departmental dose-optimization program which includes automated exposure control, adjustment of the mA and/or kV according to patient size and/or use of iterative reconstruction technique. CONTRAST:  75mL OMNIPAQUE IOHEXOL 350 MG/ML SOLN COMPARISON:  Chest x-ray 05/05/2022, chest CT 09/22/2020 FINDINGS: Cardiovascular: Satisfactory opacification of the pulmonary arteries to the segmental level. No evidence of pulmonary embolism. Mild  aortic atherosclerosis. Slightly enlarged main pulmonary arteries suggesting hypertension. Mild cardiomegaly. No pericardial effusion Mediastinum/Nodes: Midline trachea. No thyroid mass. Pretracheal lymph node measuring 8 mm, previously 7 mm. Precarinal lymph node measuring 17 mm, previously 14 mm. Prevascular lymph node measuring 9 mm, previously 7 mm. Esophagus within normal limits. Lungs/Pleura: Emphysema. Left lower lobectomy changes. No acute airspace disease. Small left pleural effusion which is chronic. 7 mm right upper lobe anterior nodule or area of mucous plugging series 6, image 53 is unchanged. No new suspicious pulmonary nodules. Bandlike areas of scarring at the left base. Upper Abdomen: No acute finding. Musculoskeletal: No acute or suspicious osseous abnormality. Review of the MIP images confirms the above findings. IMPRESSION: 1. Negative for acute pulmonary embolus. 2. Emphysema. Left postsurgical changes with chronic small left pleural effusion. No acute airspace disease. 3. Slight interval increase in size of mediastinal lymph nodes, nonspecific, could be reactive, but attention on follow-up imaging. 4. Aortic atherosclerosis. Aortic Atherosclerosis (ICD10-I70.0) and Emphysema (ICD10-J43.9). Electronically Signed   By: Jasmine Pang M.D.   On: 05/05/2022 23:19   CT HEAD WO CONTRAST  Result Date: 05/05/2022 CLINICAL DATA:  Trauma. EXAM: CT HEAD WITHOUT CONTRAST CT CERVICAL SPINE WITHOUT CONTRAST TECHNIQUE: Multidetector CT imaging of the head and cervical spine was performed following the standard protocol without intravenous contrast. Multiplanar CT image reconstructions of the cervical spine were also generated. RADIATION DOSE REDUCTION: This exam was performed according to the departmental dose-optimization program which includes automated exposure control, adjustment of the mA and/or kV according to patient size and/or use of iterative reconstruction technique. COMPARISON:  CT head/cervical  spine 09/22/2020 FINDINGS: CT HEAD FINDINGS Brain: Ventricles and sulci are prominent compatible with  atrophy. Periventricular and subcortical white matter hypodensities compatible with chronic microvascular ischemic changes. No evidence for acute cortically based infarct, intracranial hemorrhage, mass lesion or mass effect. Vascular: No hyperdense vessel or unexpected calcification. Skull: Normal. Negative for fracture or focal lesion. Sinuses/Orbits: Air-fluid level right maxillary sinus. Small amount of fluid in the left sphenoid sinus. Mastoid air cells unremarkable. Other: None CT CERVICAL SPINE FINDINGS Alignment: Minimal grade 1 anterolisthesis C3 on C4 and C4 on C5. This is likely secondary to facet degenerative changes. Skull base and vertebrae: No acute fracture. No primary bone lesion or focal pathologic process. Soft tissues and spinal canal: No prevertebral fluid or swelling. No visible canal hematoma. Disc levels:  C5-6 degenerative disc disease. Upper chest: Biapical emphysematous change and subpleural scarring. Other: None IMPRESSION: 1. No acute intracranial abnormality. 2. Atrophy and chronic microvascular ischemic changes. 3. Air-fluid level in the right maxillary sinus and small amount of fluid in the left sphenoid sinus. Correlate for sinusitis. 4. No acute cervical spine abnormality. Electronically Signed   By: Annia Belt M.D.   On: 05/05/2022 19:57   CT CERVICAL SPINE WO CONTRAST  Result Date: 05/05/2022 CLINICAL DATA:  Trauma. EXAM: CT HEAD WITHOUT CONTRAST CT CERVICAL SPINE WITHOUT CONTRAST TECHNIQUE: Multidetector CT imaging of the head and cervical spine was performed following the standard protocol without intravenous contrast. Multiplanar CT image reconstructions of the cervical spine were also generated. RADIATION DOSE REDUCTION: This exam was performed according to the departmental dose-optimization program which includes automated exposure control, adjustment of the mA and/or kV  according to patient size and/or use of iterative reconstruction technique. COMPARISON:  CT head/cervical spine 09/22/2020 FINDINGS: CT HEAD FINDINGS Brain: Ventricles and sulci are prominent compatible with atrophy. Periventricular and subcortical white matter hypodensities compatible with chronic microvascular ischemic changes. No evidence for acute cortically based infarct, intracranial hemorrhage, mass lesion or mass effect. Vascular: No hyperdense vessel or unexpected calcification. Skull: Normal. Negative for fracture or focal lesion. Sinuses/Orbits: Air-fluid level right maxillary sinus. Small amount of fluid in the left sphenoid sinus. Mastoid air cells unremarkable. Other: None CT CERVICAL SPINE FINDINGS Alignment: Minimal grade 1 anterolisthesis C3 on C4 and C4 on C5. This is likely secondary to facet degenerative changes. Skull base and vertebrae: No acute fracture. No primary bone lesion or focal pathologic process. Soft tissues and spinal canal: No prevertebral fluid or swelling. No visible canal hematoma. Disc levels:  C5-6 degenerative disc disease. Upper chest: Biapical emphysematous change and subpleural scarring. Other: None IMPRESSION: 1. No acute intracranial abnormality. 2. Atrophy and chronic microvascular ischemic changes. 3. Air-fluid level in the right maxillary sinus and small amount of fluid in the left sphenoid sinus. Correlate for sinusitis. 4. No acute cervical spine abnormality. Electronically Signed   By: Annia Belt M.D.   On: 05/05/2022 19:57   DG Pelvis Portable  Result Date: 05/05/2022 CLINICAL DATA:  Trauma. EXAM: PORTABLE PELVIS 1-2 VIEWS COMPARISON:  CT examination dated September 22, 2020 FINDINGS: Status post left hip arthroplasty with intact hardware. Mild right hip osteoarthritis with joint space narrowing and marginal spurring. There is no evidence of pelvic fracture or diastasis. No pelvic bone lesions are seen. IMPRESSION: 1. No acute fracture or dislocation. 2. Status  post left hip arthroplasty. Electronically Signed   By: Larose Hires D.O.   On: 05/05/2022 18:48   DG Chest Port 1 View  Result Date: 05/05/2022 CLINICAL DATA:  Patient has been falling frequently. Low O2 sats at 86% patient denies shortness of breath  cough fever. EXAM: PORTABLE CHEST 1 VIEW COMPARISON:  Chest radiograph dated September 22, 2020 FINDINGS: The heart is enlarged. Atherosclerotic calcification of the aortic arch. Left lower lobe subsegmental linear atelectasis and chronic elevation of the left hemidiaphragm. Thoracic spondylosis. IMPRESSION: 1. Cardiomegaly. 2. Left lower lobe subsegmental linear atelectasis and chronic elevation of the left hemidiaphragm. Electronically Signed   By: Larose Hires D.O.   On: 05/05/2022 18:46      Subjective: Patient seen and examined at bedside today.  Hemodynamically stable.  Was on 2 L of oxygen during my evaluation.  Did not complain of any shortness of breath or cough during my evaluation.  Really wants to go home today. Discussed with husband at bedside.  I also called daughter-in-law for update twice, calls not received  Discharge Exam: Vitals:   05/07/22 1027 05/07/22 1030  BP: (!) 144/83   Pulse: 76   Resp:    Temp: 97.7 F (36.5 C)   SpO2:  (!) 84%   Vitals:   05/07/22 0500 05/07/22 1024 05/07/22 1027 05/07/22 1030  BP:   (!) 144/83   Pulse:   76   Resp:      Temp:   97.7 F (36.5 C)   TempSrc:   Oral   SpO2:  (!) 87%  (!) 84%  Weight: 67.7 kg     Height:        General: Pt is alert, awake, not in acute distress Cardiovascular: RRR, S1/S2 +, no rubs, no gallops Respiratory: Mild diminished sounds bilaterally but no wheezing or crackles Abdominal: Soft, NT, ND, bowel sounds + Extremities: no edema, no cyanosis    The results of significant diagnostics from this hospitalization (including imaging, microbiology, ancillary and laboratory) are listed below for reference.     Microbiology: No results found for this or any  previous visit (from the past 240 hour(s)).   Labs: BNP (last 3 results) Recent Labs    05/05/22 1832  BNP 254.0*   Basic Metabolic Panel: Recent Labs  Lab 05/05/22 1832 05/06/22 0856 05/06/22 0942 05/07/22 0449  NA 138 138  --  136  K 3.3* 2.9*  --  4.3  CL 97* 98  --  99  CO2 31 28  --  30  GLUCOSE 98 228*  --  136*  BUN 19 14  --  14  CREATININE 0.65 0.72  --  0.60  CALCIUM 8.4* 8.2*  --  8.5*  MG 2.0  --  1.9  --    Liver Function Tests: Recent Labs  Lab 05/05/22 1832  AST 19  ALT 17  ALKPHOS 64  BILITOT 0.5  PROT 6.8  ALBUMIN 3.1*   No results for input(s): "LIPASE", "AMYLASE" in the last 168 hours. No results for input(s): "AMMONIA" in the last 168 hours. CBC: Recent Labs  Lab 05/05/22 1832 05/07/22 0449  WBC 14.5* 28.4*  HGB 11.9* 12.0  HCT 37.5 38.6  MCV 89.7 90.8  PLT 343 393   Cardiac Enzymes: No results for input(s): "CKTOTAL", "CKMB", "CKMBINDEX", "TROPONINI" in the last 168 hours. BNP: Invalid input(s): "POCBNP" CBG: No results for input(s): "GLUCAP" in the last 168 hours. D-Dimer No results for input(s): "DDIMER" in the last 72 hours. Hgb A1c No results for input(s): "HGBA1C" in the last 72 hours. Lipid Profile No results for input(s): "CHOL", "HDL", "LDLCALC", "TRIG", "CHOLHDL", "LDLDIRECT" in the last 72 hours. Thyroid function studies No results for input(s): "TSH", "T4TOTAL", "T3FREE", "THYROIDAB" in the last 72 hours.  Invalid input(s): "FREET3" Anemia work up No results for input(s): "VITAMINB12", "FOLATE", "FERRITIN", "TIBC", "IRON", "RETICCTPCT" in the last 72 hours. Urinalysis    Component Value Date/Time   COLORURINE COLORLESS (A) 05/05/2022 1925   APPEARANCEUR CLEAR 05/05/2022 1925   LABSPEC 1.003 (L) 05/05/2022 1925   PHURINE 5.0 05/05/2022 1925   GLUCOSEU NEGATIVE 05/05/2022 1925   HGBUR NEGATIVE 05/05/2022 1925   BILIRUBINUR NEGATIVE 05/05/2022 1925   KETONESUR NEGATIVE 05/05/2022 1925   PROTEINUR NEGATIVE  05/05/2022 1925   NITRITE NEGATIVE 05/05/2022 1925   LEUKOCYTESUR NEGATIVE 05/05/2022 1925   Sepsis Labs Recent Labs  Lab 05/05/22 1832 05/07/22 0449  WBC 14.5* 28.4*   Microbiology No results found for this or any previous visit (from the past 240 hour(s)).  Please note: You were cared for by a hospitalist during your hospital stay. Once you are discharged, your primary care physician will handle any further medical issues. Please note that NO REFILLS for any discharge medications will be authorized once you are discharged, as it is imperative that you return to your primary care physician (or establish a relationship with a primary care physician if you do not have one) for your post hospital discharge needs so that they can reassess your need for medications and monitor your lab values.    Time coordinating discharge: 40 minutes  SIGNED:   Burnadette Pop, MD  [facility] Hospitalists 05/07/2022, 11:31 AM Pager [phone]  If 7PM-7AM, please contact night-coverage [email or web address] Password [email or web address]

## 2022-05-07 NOTE — Progress Notes (Signed)
Physical Therapy Treatment Patient Details Name: Marissa Turner MRN: [id number] DOB: 12/24/1940 Today's Date: 05/07/2022   History of Present Illness Marissa Turner is a 82 y.o. 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  She fell last night and strike her head, so her sister convinced her to go to an urgent care where she was eventually directed to go to the ED for further evaluation and management.  Patient states that the urgent care was concerned of fluid behind her eardrums, however patient denied any ear pain or use of alcohol.  She denies headache, blurry vision.    05/07/22 1216  Oxygen Therapy  SpO2 91 % (Room air SpO2: sitting 87%, standing 84%, gait 80; returned to 91% during gait.)  O2 Device Nasal Cannula  O2 Flow Rate (L/min) 3 L/min      PT Comments    Pt friendly and willing to participate with PT today.  Joined RN during gait training to assess need for O2 saturation at home.  Pt presents with decreased O2 saturation with transfers and gait to 80% on room air and need for 3L O2 assistance via nasal cannula to return to WNL.  Pt presents with increased cadence and distance during gait, used RW for stability standing with no LOB episodes.  EOS pt left in room with call bell within reach, RN in room as well family.    Recommendations for follow up therapy are one component of a multi-disciplinary discharge planning process, led by the attending physician.  Recommendations Marissa be updated based on patient status, additional functional criteria and insurance authorization.  Follow Up Recommendations       Assistance Recommended at Discharge Set up Supervision/Assistance  Patient can return home with  the following A little help with walking and/or transfers;A little help with bathing/dressing/bathroom;Help with stairs or ramp for entrance;Assistance with cooking/housework   Equipment Recommendations  None recommended by PT    Recommendations for Other Services       Precautions / Restrictions Precautions Precautions: Fall     Mobility  Bed Mobility               General bed mobility comments: Pt sitting in chair upon entrance    Transfers Overall transfer level: Modified independent Equipment used: Rolling walker (2 wheels), None, 1 person hand held assist Transfers: Sit to/from Stand, Bed to chair/wheelchair/BSC Sit to Stand: Supervision           General transfer comment: Cueing for hand placement to stand safely, use of RW for standing stability    Ambulation/Gait Ambulation/Gait assistance: Supervision, Min guard Gait Distance (Feet): 250 Feet Assistive device: Rolling walker (2 wheels) Gait Pattern/deviations: Decreased step length - right, Decreased step length - left, Decreased stride length Gait velocity: WNL     General Gait Details: increased cadence and distance with gait, use RW for stability with no LOB.  Limited mainly with decreased SpO2 dropping to 80% on room air, 84% at 2L and need for 3L O2 to 91%.   Stairs             Wheelchair Mobility    Modified Rankin (Stroke Patients Only)       Balance  Cognition Arousal/Alertness: Awake/alert Behavior During Therapy: WFL for tasks assessed/performed Overall Cognitive Status: Within Functional Limits for tasks assessed                                          Exercises      General Comments        Pertinent Vitals/Pain Pain Assessment Pain Assessment: No/denies pain    Home Living                          Prior Function            PT Goals (current goals can now be found in  the care plan section)      Frequency    Min 3X/week      PT Plan      Co-evaluation              AM-PAC PT "6 Clicks" Mobility   Outcome Measure  Help needed turning from your back to your side while in a flat bed without using bedrails?: None Help needed moving from lying on your back to sitting on the side of a flat bed without using bedrails?: None Help needed moving to and from a bed to a chair (including a wheelchair)?: None Help needed standing up from a chair using your arms (e.g., wheelchair or bedside chair)?: None Help needed to walk in hospital room?: A Little Help needed climbing 3-5 steps with a railing? : A Little 6 Click Score: 22    End of Session   Activity Tolerance: Patient tolerated treatment well;Patient limited by fatigue Patient left: in chair;with call bell/phone within reach;with nursing/sitter in room Nurse Communication: Mobility status PT Visit Diagnosis: Unsteadiness on feet (R26.81);Other abnormalities of gait and mobility (R26.89);Muscle weakness (generalized) (M62.81)     Time: 0931-1216 PT Time Calculation (min) (ACUTE ONLY): 25 min  Charges:  $Therapeutic Activity: 23-37 mins                     Becky Sax, LPTA/CLT; CBIS [phone]  Juel Burrow 05/07/2022, 12:16 PM

## 2022-05-07 NOTE — Progress Notes (Signed)
Patient discharged home with instructions given on medications and follow up visits,patient verbalized understanding. Prescriptions sent to Pharmacy of choice documented on AVS. IV discontinued, catheter intact. Accompanied by staff to an awaiting vehicle. 

## 2022-05-08 DIAGNOSIS — E119 Type 2 diabetes mellitus without complications: Secondary | ICD-10-CM | POA: Diagnosis not present

## 2022-05-08 DIAGNOSIS — F410 Panic disorder [episodic paroxysmal anxiety] without agoraphobia: Secondary | ICD-10-CM | POA: Diagnosis not present

## 2022-05-08 DIAGNOSIS — Z85118 Personal history of other malignant neoplasm of bronchus and lung: Secondary | ICD-10-CM | POA: Diagnosis not present

## 2022-05-08 DIAGNOSIS — I959 Hypotension, unspecified: Secondary | ICD-10-CM | POA: Diagnosis not present

## 2022-05-08 DIAGNOSIS — I4821 Permanent atrial fibrillation: Secondary | ICD-10-CM | POA: Diagnosis not present

## 2022-05-08 DIAGNOSIS — Z79891 Long term (current) use of opiate analgesic: Secondary | ICD-10-CM | POA: Diagnosis not present

## 2022-05-08 DIAGNOSIS — Z87891 Personal history of nicotine dependence: Secondary | ICD-10-CM | POA: Diagnosis not present

## 2022-05-08 DIAGNOSIS — Z7901 Long term (current) use of anticoagulants: Secondary | ICD-10-CM | POA: Diagnosis not present

## 2022-05-08 DIAGNOSIS — E876 Hypokalemia: Secondary | ICD-10-CM | POA: Diagnosis not present

## 2022-05-08 DIAGNOSIS — Z8572 Personal history of non-Hodgkin lymphomas: Secondary | ICD-10-CM | POA: Diagnosis not present

## 2022-05-08 DIAGNOSIS — Z902 Acquired absence of lung [part of]: Secondary | ICD-10-CM | POA: Diagnosis not present

## 2022-05-08 DIAGNOSIS — D509 Iron deficiency anemia, unspecified: Secondary | ICD-10-CM | POA: Diagnosis not present

## 2022-05-08 DIAGNOSIS — N200 Calculus of kidney: Secondary | ICD-10-CM | POA: Diagnosis not present

## 2022-05-08 DIAGNOSIS — I10 Essential (primary) hypertension: Secondary | ICD-10-CM | POA: Diagnosis not present

## 2022-05-08 DIAGNOSIS — J209 Acute bronchitis, unspecified: Secondary | ICD-10-CM | POA: Diagnosis not present

## 2022-05-08 DIAGNOSIS — J9601 Acute respiratory failure with hypoxia: Secondary | ICD-10-CM | POA: Diagnosis not present

## 2022-05-08 DIAGNOSIS — D72829 Elevated white blood cell count, unspecified: Secondary | ICD-10-CM | POA: Diagnosis not present

## 2022-05-08 DIAGNOSIS — Z9181 History of falling: Secondary | ICD-10-CM | POA: Diagnosis not present

## 2022-05-08 DIAGNOSIS — F32A Depression, unspecified: Secondary | ICD-10-CM | POA: Diagnosis not present

## 2022-05-08 DIAGNOSIS — E46 Unspecified protein-calorie malnutrition: Secondary | ICD-10-CM | POA: Diagnosis not present

## 2022-05-08 DIAGNOSIS — E8809 Other disorders of plasma-protein metabolism, not elsewhere classified: Secondary | ICD-10-CM | POA: Diagnosis not present

## 2022-05-08 DIAGNOSIS — K279 Peptic ulcer, site unspecified, unspecified as acute or chronic, without hemorrhage or perforation: Secondary | ICD-10-CM | POA: Diagnosis not present

## 2022-05-08 DIAGNOSIS — J440 Chronic obstructive pulmonary disease with acute lower respiratory infection: Secondary | ICD-10-CM | POA: Diagnosis not present

## 2022-05-09 ENCOUNTER — Other Ambulatory Visit: Payer: Self-pay

## 2022-05-12 DIAGNOSIS — J209 Acute bronchitis, unspecified: Secondary | ICD-10-CM | POA: Diagnosis not present

## 2022-05-12 DIAGNOSIS — I4821 Permanent atrial fibrillation: Secondary | ICD-10-CM | POA: Diagnosis not present

## 2022-05-12 DIAGNOSIS — J9601 Acute respiratory failure with hypoxia: Secondary | ICD-10-CM | POA: Diagnosis not present

## 2022-05-12 DIAGNOSIS — E119 Type 2 diabetes mellitus without complications: Secondary | ICD-10-CM | POA: Diagnosis not present

## 2022-05-12 DIAGNOSIS — J440 Chronic obstructive pulmonary disease with acute lower respiratory infection: Secondary | ICD-10-CM | POA: Diagnosis not present

## 2022-05-12 DIAGNOSIS — E46 Unspecified protein-calorie malnutrition: Secondary | ICD-10-CM | POA: Diagnosis not present

## 2022-05-13 DIAGNOSIS — I4821 Permanent atrial fibrillation: Secondary | ICD-10-CM | POA: Diagnosis not present

## 2022-05-13 DIAGNOSIS — J209 Acute bronchitis, unspecified: Secondary | ICD-10-CM | POA: Diagnosis not present

## 2022-05-13 DIAGNOSIS — E46 Unspecified protein-calorie malnutrition: Secondary | ICD-10-CM | POA: Diagnosis not present

## 2022-05-13 DIAGNOSIS — J9601 Acute respiratory failure with hypoxia: Secondary | ICD-10-CM | POA: Diagnosis not present

## 2022-05-13 DIAGNOSIS — E119 Type 2 diabetes mellitus without complications: Secondary | ICD-10-CM | POA: Diagnosis not present

## 2022-05-13 DIAGNOSIS — J440 Chronic obstructive pulmonary disease with acute lower respiratory infection: Secondary | ICD-10-CM | POA: Diagnosis not present

## 2022-05-15 DIAGNOSIS — J209 Acute bronchitis, unspecified: Secondary | ICD-10-CM | POA: Diagnosis not present

## 2022-05-15 DIAGNOSIS — E46 Unspecified protein-calorie malnutrition: Secondary | ICD-10-CM | POA: Diagnosis not present

## 2022-05-15 DIAGNOSIS — E119 Type 2 diabetes mellitus without complications: Secondary | ICD-10-CM | POA: Diagnosis not present

## 2022-05-15 DIAGNOSIS — J440 Chronic obstructive pulmonary disease with acute lower respiratory infection: Secondary | ICD-10-CM | POA: Diagnosis not present

## 2022-05-15 DIAGNOSIS — I4821 Permanent atrial fibrillation: Secondary | ICD-10-CM | POA: Diagnosis not present

## 2022-05-15 DIAGNOSIS — J9601 Acute respiratory failure with hypoxia: Secondary | ICD-10-CM | POA: Diagnosis not present

## 2022-05-21 DIAGNOSIS — J440 Chronic obstructive pulmonary disease with acute lower respiratory infection: Secondary | ICD-10-CM | POA: Diagnosis not present

## 2022-05-21 DIAGNOSIS — J9601 Acute respiratory failure with hypoxia: Secondary | ICD-10-CM | POA: Diagnosis not present

## 2022-05-21 DIAGNOSIS — E119 Type 2 diabetes mellitus without complications: Secondary | ICD-10-CM | POA: Diagnosis not present

## 2022-05-21 DIAGNOSIS — E46 Unspecified protein-calorie malnutrition: Secondary | ICD-10-CM | POA: Diagnosis not present

## 2022-05-21 DIAGNOSIS — I4821 Permanent atrial fibrillation: Secondary | ICD-10-CM | POA: Diagnosis not present

## 2022-05-21 DIAGNOSIS — J209 Acute bronchitis, unspecified: Secondary | ICD-10-CM | POA: Diagnosis not present

## 2022-05-29 DIAGNOSIS — D72829 Elevated white blood cell count, unspecified: Secondary | ICD-10-CM | POA: Diagnosis not present

## 2022-05-29 DIAGNOSIS — I700 Atherosclerosis of aorta: Secondary | ICD-10-CM | POA: Diagnosis not present

## 2022-05-29 DIAGNOSIS — I4821 Permanent atrial fibrillation: Secondary | ICD-10-CM | POA: Diagnosis not present

## 2022-05-29 DIAGNOSIS — E119 Type 2 diabetes mellitus without complications: Secondary | ICD-10-CM | POA: Diagnosis not present

## 2022-05-29 DIAGNOSIS — J440 Chronic obstructive pulmonary disease with acute lower respiratory infection: Secondary | ICD-10-CM | POA: Diagnosis not present

## 2022-05-29 DIAGNOSIS — J209 Acute bronchitis, unspecified: Secondary | ICD-10-CM | POA: Diagnosis not present

## 2022-05-29 DIAGNOSIS — I482 Chronic atrial fibrillation, unspecified: Secondary | ICD-10-CM | POA: Diagnosis not present

## 2022-05-29 DIAGNOSIS — R9389 Abnormal findings on diagnostic imaging of other specified body structures: Secondary | ICD-10-CM | POA: Diagnosis not present

## 2022-05-29 DIAGNOSIS — E46 Unspecified protein-calorie malnutrition: Secondary | ICD-10-CM | POA: Diagnosis not present

## 2022-05-29 DIAGNOSIS — J9601 Acute respiratory failure with hypoxia: Secondary | ICD-10-CM | POA: Diagnosis not present

## 2022-05-29 DIAGNOSIS — J439 Emphysema, unspecified: Secondary | ICD-10-CM | POA: Diagnosis not present

## 2022-05-29 DIAGNOSIS — Z9981 Dependence on supplemental oxygen: Secondary | ICD-10-CM | POA: Diagnosis not present

## 2022-06-01 NOTE — Progress Notes (Deleted)
   Marissa Turner, female    DOB: [birth year]    MRN: [id number]   Brief patient profile:  51  yowf  *** referred to pulmonary clinic in [place]  06/02/2022 by *** for ***      History of Present Illness  06/02/2022  Pulmonary/ 1st office eval/ [facility] / [place] [facility]  No chief complaint on file.    Dyspnea:  *** Cough: *** Sleep: *** SABA use: *** 02: *** Lung cancer screen: ***  Past Medical History:  Diagnosis Date   Atrial fibrillation, chronic (HCC) 2004   left atrial thrombus in 2004; near syncope in 2008; echocardiogram in 2008-normal EF, mild left atrial enlargement, no valvular abnormalities, lipomatous hypertrophy; no aortic atheroma on TEE in 2004   Cancer (HCC)    LYMPH NODE    and lung cancer   Chronic anticoagulation 2004   warfarin   COPD (chronic obstructive pulmonary disease) (HCC)    denies   Depression    DJD (degenerative joint disease), lumbosacral    Also hip   Dysrhythmia    chronic AFib   Family history of adverse reaction to anesthesia    sister PONV   Fracture of wrist    Left   History of kidney stones    once   Hypotension    Panic attacks    Peptic ulcer disease    s/p Billroth II   PONV (postoperative nausea and vomiting)    Tobacco abuse    40 pack years; quit attempt in 2012    Outpatient Medications Prior to Visit  Medication Sig Dispense Refill   acetaminophen (TYLENOL) 650 MG CR tablet Take 1,300 mg by mouth every evening.      albuterol (VENTOLIN HFA) 108 (90 Base) MCG/ACT inhaler Inhale 1-2 puffs into the lungs every 4 (four) hours as needed for wheezing or shortness of breath.     ALPRAZolam (XANAX) 1 MG tablet Take 1 tablet (1 mg total) by mouth 3 (three) times daily as needed for anxiety. (Patient taking differently: Take 1 mg by mouth at bedtime.) 30 tablet 0   apixaban (ELIQUIS) 5 MG TABS tablet TAKE ONE TABLET (5MG  TOTAL) BY MOUTH TWOTIMES DAILY (Patient taking differently: Take 5 mg by mouth 2 (two) times daily.) 180  tablet 3   benzonatate (TESSALON PERLES) 100 MG capsule Take 100 mg by mouth 3 (three) times daily as needed for cough.     citalopram (CELEXA) 20 MG tablet Take 20 mg by mouth daily.     diltiazem (CARDIZEM CD) 240 MG 24 hr capsule Take 1 capsule (240 mg total) by mouth daily. 90 capsule 9   pantoprazole (PROTONIX) 40 MG tablet Take 1 tablet (40 mg total) by mouth daily for 14 days. 14 tablet 0   No facility-administered medications prior to visit.     Objective:     There were no vitals taken for this visit.         Assessment   No problem-specific Assessment & Plan notes found for this encounter.     Sandrea Hughs, MD 06/01/2022

## 2022-06-02 ENCOUNTER — Institutional Professional Consult (permissible substitution): Payer: Medicare Other | Admitting: Internal Medicine

## 2022-06-05 DIAGNOSIS — J9601 Acute respiratory failure with hypoxia: Secondary | ICD-10-CM | POA: Diagnosis not present

## 2022-06-05 DIAGNOSIS — J209 Acute bronchitis, unspecified: Secondary | ICD-10-CM | POA: Diagnosis not present

## 2022-06-05 DIAGNOSIS — E119 Type 2 diabetes mellitus without complications: Secondary | ICD-10-CM | POA: Diagnosis not present

## 2022-06-05 DIAGNOSIS — I4821 Permanent atrial fibrillation: Secondary | ICD-10-CM | POA: Diagnosis not present

## 2022-06-05 DIAGNOSIS — J440 Chronic obstructive pulmonary disease with acute lower respiratory infection: Secondary | ICD-10-CM | POA: Diagnosis not present

## 2022-06-05 DIAGNOSIS — E46 Unspecified protein-calorie malnutrition: Secondary | ICD-10-CM | POA: Diagnosis not present

## 2022-06-07 DIAGNOSIS — Z85118 Personal history of other malignant neoplasm of bronchus and lung: Secondary | ICD-10-CM | POA: Diagnosis not present

## 2022-06-07 DIAGNOSIS — Z87891 Personal history of nicotine dependence: Secondary | ICD-10-CM | POA: Diagnosis not present

## 2022-06-07 DIAGNOSIS — N200 Calculus of kidney: Secondary | ICD-10-CM | POA: Diagnosis not present

## 2022-06-07 DIAGNOSIS — I10 Essential (primary) hypertension: Secondary | ICD-10-CM | POA: Diagnosis not present

## 2022-06-07 DIAGNOSIS — D72829 Elevated white blood cell count, unspecified: Secondary | ICD-10-CM | POA: Diagnosis not present

## 2022-06-07 DIAGNOSIS — E46 Unspecified protein-calorie malnutrition: Secondary | ICD-10-CM | POA: Diagnosis not present

## 2022-06-07 DIAGNOSIS — E876 Hypokalemia: Secondary | ICD-10-CM | POA: Diagnosis not present

## 2022-06-07 DIAGNOSIS — I4821 Permanent atrial fibrillation: Secondary | ICD-10-CM | POA: Diagnosis not present

## 2022-06-07 DIAGNOSIS — Z79891 Long term (current) use of opiate analgesic: Secondary | ICD-10-CM | POA: Diagnosis not present

## 2022-06-07 DIAGNOSIS — Z902 Acquired absence of lung [part of]: Secondary | ICD-10-CM | POA: Diagnosis not present

## 2022-06-07 DIAGNOSIS — J440 Chronic obstructive pulmonary disease with acute lower respiratory infection: Secondary | ICD-10-CM | POA: Diagnosis not present

## 2022-06-07 DIAGNOSIS — F32A Depression, unspecified: Secondary | ICD-10-CM | POA: Diagnosis not present

## 2022-06-07 DIAGNOSIS — E119 Type 2 diabetes mellitus without complications: Secondary | ICD-10-CM | POA: Diagnosis not present

## 2022-06-07 DIAGNOSIS — D509 Iron deficiency anemia, unspecified: Secondary | ICD-10-CM | POA: Diagnosis not present

## 2022-06-07 DIAGNOSIS — I959 Hypotension, unspecified: Secondary | ICD-10-CM | POA: Diagnosis not present

## 2022-06-07 DIAGNOSIS — J9601 Acute respiratory failure with hypoxia: Secondary | ICD-10-CM | POA: Diagnosis not present

## 2022-06-07 DIAGNOSIS — K279 Peptic ulcer, site unspecified, unspecified as acute or chronic, without hemorrhage or perforation: Secondary | ICD-10-CM | POA: Diagnosis not present

## 2022-06-07 DIAGNOSIS — Z8572 Personal history of non-Hodgkin lymphomas: Secondary | ICD-10-CM | POA: Diagnosis not present

## 2022-06-07 DIAGNOSIS — Z7901 Long term (current) use of anticoagulants: Secondary | ICD-10-CM | POA: Diagnosis not present

## 2022-06-07 DIAGNOSIS — F410 Panic disorder [episodic paroxysmal anxiety] without agoraphobia: Secondary | ICD-10-CM | POA: Diagnosis not present

## 2022-06-07 DIAGNOSIS — E8809 Other disorders of plasma-protein metabolism, not elsewhere classified: Secondary | ICD-10-CM | POA: Diagnosis not present

## 2022-06-07 DIAGNOSIS — Z9181 History of falling: Secondary | ICD-10-CM | POA: Diagnosis not present

## 2022-06-07 DIAGNOSIS — J209 Acute bronchitis, unspecified: Secondary | ICD-10-CM | POA: Diagnosis not present

## 2022-06-09 DIAGNOSIS — J440 Chronic obstructive pulmonary disease with acute lower respiratory infection: Secondary | ICD-10-CM | POA: Diagnosis not present

## 2022-06-09 DIAGNOSIS — I4821 Permanent atrial fibrillation: Secondary | ICD-10-CM | POA: Diagnosis not present

## 2022-06-09 DIAGNOSIS — J209 Acute bronchitis, unspecified: Secondary | ICD-10-CM | POA: Diagnosis not present

## 2022-06-09 DIAGNOSIS — E119 Type 2 diabetes mellitus without complications: Secondary | ICD-10-CM | POA: Diagnosis not present

## 2022-06-09 DIAGNOSIS — J9601 Acute respiratory failure with hypoxia: Secondary | ICD-10-CM | POA: Diagnosis not present

## 2022-06-09 DIAGNOSIS — E46 Unspecified protein-calorie malnutrition: Secondary | ICD-10-CM | POA: Diagnosis not present

## 2022-06-17 DIAGNOSIS — J9601 Acute respiratory failure with hypoxia: Secondary | ICD-10-CM | POA: Diagnosis not present

## 2022-06-17 DIAGNOSIS — J440 Chronic obstructive pulmonary disease with acute lower respiratory infection: Secondary | ICD-10-CM | POA: Diagnosis not present

## 2022-06-17 DIAGNOSIS — J209 Acute bronchitis, unspecified: Secondary | ICD-10-CM | POA: Diagnosis not present

## 2022-06-17 DIAGNOSIS — E119 Type 2 diabetes mellitus without complications: Secondary | ICD-10-CM | POA: Diagnosis not present

## 2022-06-17 DIAGNOSIS — I4821 Permanent atrial fibrillation: Secondary | ICD-10-CM | POA: Diagnosis not present

## 2022-06-17 DIAGNOSIS — E46 Unspecified protein-calorie malnutrition: Secondary | ICD-10-CM | POA: Diagnosis not present

## 2022-06-24 DIAGNOSIS — I4821 Permanent atrial fibrillation: Secondary | ICD-10-CM | POA: Diagnosis not present

## 2022-06-24 DIAGNOSIS — E119 Type 2 diabetes mellitus without complications: Secondary | ICD-10-CM | POA: Diagnosis not present

## 2022-06-24 DIAGNOSIS — J209 Acute bronchitis, unspecified: Secondary | ICD-10-CM | POA: Diagnosis not present

## 2022-06-24 DIAGNOSIS — J9601 Acute respiratory failure with hypoxia: Secondary | ICD-10-CM | POA: Diagnosis not present

## 2022-06-24 DIAGNOSIS — J440 Chronic obstructive pulmonary disease with acute lower respiratory infection: Secondary | ICD-10-CM | POA: Diagnosis not present

## 2022-06-24 DIAGNOSIS — E46 Unspecified protein-calorie malnutrition: Secondary | ICD-10-CM | POA: Diagnosis not present

## 2022-06-26 ENCOUNTER — Other Ambulatory Visit: Payer: Self-pay

## 2022-06-28 ENCOUNTER — Other Ambulatory Visit: Payer: Self-pay

## 2022-06-28 ENCOUNTER — Inpatient Hospital Stay (HOSPITAL_COMMUNITY)
Admission: EM | Admit: 2022-06-28 | Discharge: 2022-07-02 | DRG: 871 | Disposition: A | Discharge status: Home Health | Payer: Medicare Other | Attending: Family Medicine | Admitting: Family Medicine

## 2022-06-28 ENCOUNTER — Emergency Department (HOSPITAL_COMMUNITY): Payer: Medicare Other

## 2022-06-28 ENCOUNTER — Encounter (HOSPITAL_COMMUNITY): Payer: Self-pay | Admitting: Emergency Medicine

## 2022-06-28 DIAGNOSIS — Z9981 Dependence on supplemental oxygen: Secondary | ICD-10-CM | POA: Diagnosis not present

## 2022-06-28 DIAGNOSIS — Z90710 Acquired absence of both cervix and uterus: Secondary | ICD-10-CM

## 2022-06-28 DIAGNOSIS — I2489 Other forms of acute ischemic heart disease: Secondary | ICD-10-CM | POA: Diagnosis present

## 2022-06-28 DIAGNOSIS — Z91199 Patient's noncompliance with other medical treatment and regimen due to unspecified reason: Secondary | ICD-10-CM

## 2022-06-28 DIAGNOSIS — N179 Acute kidney failure, unspecified: Secondary | ICD-10-CM | POA: Insufficient documentation

## 2022-06-28 DIAGNOSIS — F410 Panic disorder [episodic paroxysmal anxiety] without agoraphobia: Secondary | ICD-10-CM | POA: Diagnosis not present

## 2022-06-28 DIAGNOSIS — N390 Urinary tract infection, site not specified: Secondary | ICD-10-CM | POA: Diagnosis not present

## 2022-06-28 DIAGNOSIS — I482 Chronic atrial fibrillation, unspecified: Secondary | ICD-10-CM | POA: Diagnosis not present

## 2022-06-28 DIAGNOSIS — J9811 Atelectasis: Secondary | ICD-10-CM | POA: Diagnosis not present

## 2022-06-28 DIAGNOSIS — Z96651 Presence of right artificial knee joint: Secondary | ICD-10-CM | POA: Diagnosis not present

## 2022-06-28 DIAGNOSIS — R079 Chest pain, unspecified: Secondary | ICD-10-CM | POA: Diagnosis not present

## 2022-06-28 DIAGNOSIS — Z66 Do not resuscitate: Secondary | ICD-10-CM | POA: Diagnosis present

## 2022-06-28 DIAGNOSIS — E871 Hypo-osmolality and hyponatremia: Secondary | ICD-10-CM | POA: Diagnosis present

## 2022-06-28 DIAGNOSIS — F32A Depression, unspecified: Secondary | ICD-10-CM | POA: Diagnosis not present

## 2022-06-28 DIAGNOSIS — Z888 Allergy status to other drugs, medicaments and biological substances status: Secondary | ICD-10-CM

## 2022-06-28 DIAGNOSIS — Z87891 Personal history of nicotine dependence: Secondary | ICD-10-CM

## 2022-06-28 DIAGNOSIS — R6520 Severe sepsis without septic shock: Secondary | ICD-10-CM | POA: Diagnosis present

## 2022-06-28 DIAGNOSIS — R300 Dysuria: Secondary | ICD-10-CM | POA: Diagnosis present

## 2022-06-28 DIAGNOSIS — Z8249 Family history of ischemic heart disease and other diseases of the circulatory system: Secondary | ICD-10-CM

## 2022-06-28 DIAGNOSIS — Z87442 Personal history of urinary calculi: Secondary | ICD-10-CM | POA: Diagnosis not present

## 2022-06-28 DIAGNOSIS — N3000 Acute cystitis without hematuria: Secondary | ICD-10-CM | POA: Diagnosis not present

## 2022-06-28 DIAGNOSIS — Z7901 Long term (current) use of anticoagulants: Secondary | ICD-10-CM | POA: Diagnosis not present

## 2022-06-28 DIAGNOSIS — Z96642 Presence of left artificial hip joint: Secondary | ICD-10-CM | POA: Diagnosis present

## 2022-06-28 DIAGNOSIS — Z85118 Personal history of other malignant neoplasm of bronchus and lung: Secondary | ICD-10-CM

## 2022-06-28 DIAGNOSIS — E876 Hypokalemia: Secondary | ICD-10-CM | POA: Diagnosis present

## 2022-06-28 DIAGNOSIS — Z8711 Personal history of peptic ulcer disease: Secondary | ICD-10-CM | POA: Diagnosis not present

## 2022-06-28 DIAGNOSIS — J9621 Acute and chronic respiratory failure with hypoxia: Secondary | ICD-10-CM | POA: Diagnosis present

## 2022-06-28 DIAGNOSIS — R7881 Bacteremia: Secondary | ICD-10-CM | POA: Diagnosis present

## 2022-06-28 DIAGNOSIS — I10 Essential (primary) hypertension: Secondary | ICD-10-CM | POA: Diagnosis present

## 2022-06-28 DIAGNOSIS — F419 Anxiety disorder, unspecified: Secondary | ICD-10-CM | POA: Diagnosis not present

## 2022-06-28 DIAGNOSIS — Z9049 Acquired absence of other specified parts of digestive tract: Secondary | ICD-10-CM

## 2022-06-28 DIAGNOSIS — M47817 Spondylosis without myelopathy or radiculopathy, lumbosacral region: Secondary | ICD-10-CM | POA: Diagnosis present

## 2022-06-28 DIAGNOSIS — Z9081 Acquired absence of spleen: Secondary | ICD-10-CM

## 2022-06-28 DIAGNOSIS — A419 Sepsis, unspecified organism: Secondary | ICD-10-CM | POA: Diagnosis not present

## 2022-06-28 DIAGNOSIS — Z880 Allergy status to penicillin: Secondary | ICD-10-CM

## 2022-06-28 DIAGNOSIS — J90 Pleural effusion, not elsewhere classified: Secondary | ICD-10-CM | POA: Diagnosis not present

## 2022-06-28 DIAGNOSIS — J449 Chronic obstructive pulmonary disease, unspecified: Secondary | ICD-10-CM | POA: Diagnosis present

## 2022-06-28 DIAGNOSIS — A4151 Sepsis due to Escherichia coli [E. coli]: Secondary | ICD-10-CM | POA: Diagnosis present

## 2022-06-28 DIAGNOSIS — E878 Other disorders of electrolyte and fluid balance, not elsewhere classified: Secondary | ICD-10-CM | POA: Diagnosis present

## 2022-06-28 DIAGNOSIS — Z79899 Other long term (current) drug therapy: Secondary | ICD-10-CM

## 2022-06-28 DIAGNOSIS — G9341 Metabolic encephalopathy: Secondary | ICD-10-CM | POA: Diagnosis present

## 2022-06-28 DIAGNOSIS — Z885 Allergy status to narcotic agent status: Secondary | ICD-10-CM

## 2022-06-28 DIAGNOSIS — A4150 Gram-negative sepsis, unspecified: Secondary | ICD-10-CM | POA: Diagnosis not present

## 2022-06-28 DIAGNOSIS — R0600 Dyspnea, unspecified: Secondary | ICD-10-CM | POA: Diagnosis not present

## 2022-06-28 DIAGNOSIS — R5383 Other fatigue: Secondary | ICD-10-CM | POA: Diagnosis not present

## 2022-06-28 LAB — COMPREHENSIVE METABOLIC PANEL
ALT: 32 U/L (ref 0–44)
AST: 25 U/L (ref 15–41)
Albumin: 3.4 g/dL — ABNORMAL LOW (ref 3.5–5.0)
Alkaline Phosphatase: 72 U/L (ref 38–126)
Anion gap: 12 (ref 5–15)
BUN: 28 mg/dL — ABNORMAL HIGH (ref 8–23)
CO2: 29 mmol/L (ref 22–32)
Calcium: 7.9 mg/dL — ABNORMAL LOW (ref 8.9–10.3)
Chloride: 90 mmol/L — ABNORMAL LOW (ref 98–111)
Creatinine, Ser: 1.27 mg/dL — ABNORMAL HIGH (ref 0.44–1.00)
GFR, Estimated: 42 mL/min — ABNORMAL LOW (ref >60)
Glucose, Bld: 114 mg/dL — ABNORMAL HIGH (ref 70–99)
Potassium: 2.9 mmol/L — ABNORMAL LOW (ref 3.5–5.1)
Sodium: 131 mmol/L — ABNORMAL LOW (ref 135–145)
Total Bilirubin: 0.9 mg/dL (ref 0.3–1.2)
Total Protein: 7.2 g/dL (ref 6.5–8.1)

## 2022-06-28 LAB — CBC WITH DIFFERENTIAL/PLATELET
Abs Immature Granulocytes: 0.35 10*3/uL — ABNORMAL HIGH (ref 0.00–0.07)
Basophils Absolute: 0.1 10*3/uL (ref 0.0–0.1)
Basophils Relative: 0 %
Eosinophils Absolute: 0.3 10*3/uL (ref 0.0–0.5)
Eosinophils Relative: 1 %
HCT: 37.1 % (ref 36.0–46.0)
Hemoglobin: 12 g/dL (ref 12.0–15.0)
Immature Granulocytes: 2 %
Lymphocytes Relative: 14 %
Lymphs Abs: 3.1 10*3/uL (ref 0.7–4.0)
MCH: 28.2 pg (ref 26.0–34.0)
MCHC: 32.3 g/dL (ref 30.0–36.0)
MCV: 87.1 fL (ref 80.0–100.0)
Monocytes Absolute: 2.5 10*3/uL — ABNORMAL HIGH (ref 0.1–1.0)
Monocytes Relative: 11 %
Neutro Abs: 15.7 10*3/uL — ABNORMAL HIGH (ref 1.7–7.7)
Neutrophils Relative %: 72 %
Platelets: 242 10*3/uL (ref 150–400)
RBC: 4.26 MIL/uL (ref 3.87–5.11)
RDW: 15.9 % — ABNORMAL HIGH (ref 11.5–15.5)
WBC: 22 10*3/uL — ABNORMAL HIGH (ref 4.0–10.5)
nRBC: 0 % (ref 0.0–0.2)

## 2022-06-28 LAB — LACTIC ACID, PLASMA
Lactic Acid, Venous: 1.9 mmol/L (ref 0.5–1.9)
Lactic Acid, Venous: 2.1 mmol/L (ref 0.5–1.9)
Lactic Acid, Venous: 1.9 mmol/L (ref 0.5–1.9)

## 2022-06-28 LAB — URINALYSIS, ROUTINE W REFLEX MICROSCOPIC
Bilirubin Urine: NEGATIVE
Glucose, UA: NEGATIVE mg/dL
Hgb urine dipstick: NEGATIVE
Ketones, ur: NEGATIVE mg/dL
Nitrite: NEGATIVE
Protein, ur: 100 mg/dL — AB
Specific Gravity, Urine: 1.014 (ref 1.005–1.030)
WBC, UA: >50 WBC/hpf (ref 0–5)
pH: 5 (ref 5.0–8.0)

## 2022-06-28 LAB — CBG MONITORING, ED: Glucose-Capillary: 131 mg/dL — ABNORMAL HIGH (ref 70–99)

## 2022-06-28 LAB — BLOOD GAS, VENOUS
Acid-Base Excess: 8.3 mmol/L — ABNORMAL HIGH (ref 0.0–2.0)
Bicarbonate: 34 mmol/L — ABNORMAL HIGH (ref 20.0–28.0)
Drawn by: 64452
O2 Saturation: 64.3 %
Patient temperature: 36.7
pCO2, Ven: 49 mmHg (ref 44–60)
pH, Ven: 7.44 — ABNORMAL HIGH (ref 7.25–7.43)
pO2, Ven: 38 mmHg (ref 32–45)

## 2022-06-28 LAB — TROPONIN I (HIGH SENSITIVITY)
Troponin I (High Sensitivity): 83 ng/L — ABNORMAL HIGH (ref <18)
Troponin I (High Sensitivity): 55 ng/L — ABNORMAL HIGH (ref <18)

## 2022-06-28 LAB — CULTURE, BLOOD (ROUTINE X 2)

## 2022-06-28 MED ORDER — APIXABAN 5 MG PO TABS
5.0000 mg | ORAL_TABLET | Freq: Two times a day (BID) | ORAL | Status: DC
  Administered 2022-06-28 – 2022-07-02 (×8): 5 mg via ORAL
  Filled 2022-06-28 (×8): qty 1

## 2022-06-28 MED ORDER — SODIUM CHLORIDE 0.9 % IV SOLN: INTRAVENOUS | Status: DC

## 2022-06-28 MED ORDER — SODIUM CHLORIDE 0.9 % IV BOLUS
500.0000 mL | Freq: Once | INTRAVENOUS | Status: AC
  Administered 2022-06-28: 500 mL via INTRAVENOUS

## 2022-06-28 MED ORDER — LACTATED RINGERS IV BOLUS
1000.0000 mL | Freq: Once | INTRAVENOUS | Status: AC
  Administered 2022-06-28: 1000 mL via INTRAVENOUS

## 2022-06-28 MED ORDER — ONDANSETRON HCL 4 MG/2ML IJ SOLN
4.0000 mg | Freq: Four times a day (QID) | INTRAMUSCULAR | Status: DC | PRN
  Administered 2022-07-01: 4 mg via INTRAVENOUS
  Filled 2022-06-28: qty 2

## 2022-06-28 MED ORDER — ACETAMINOPHEN 325 MG PO TABS
ORAL_TABLET | ORAL | Status: AC
  Administered 2022-06-28: 650 mg via ORAL
  Filled 2022-06-28: qty 2

## 2022-06-28 MED ORDER — ACETAMINOPHEN 325 MG PO TABS
650.0000 mg | ORAL_TABLET | ORAL | Status: DC | PRN
  Administered 2022-06-28 – 2022-06-30 (×4): 650 mg via ORAL
  Filled 2022-06-28 (×3): qty 2

## 2022-06-28 MED ORDER — SODIUM CHLORIDE 0.9 % IV SOLN
2.0000 g | INTRAVENOUS | Status: DC
  Administered 2022-06-29 – 2022-07-01 (×3): 2 g via INTRAVENOUS
  Filled 2022-06-28 (×2): qty 20

## 2022-06-28 MED ORDER — DOCUSATE SODIUM 100 MG PO CAPS: 100.0000 mg | ORAL_CAPSULE | Freq: Two times a day (BID) | ORAL | Status: DC | PRN

## 2022-06-28 MED ORDER — ALPRAZOLAM 1 MG PO TABS
1.0000 mg | ORAL_TABLET | Freq: Three times a day (TID) | ORAL | Status: DC | PRN
  Administered 2022-07-01: 1 mg via ORAL
  Filled 2022-06-28: qty 1

## 2022-06-28 MED ORDER — IPRATROPIUM-ALBUTEROL 0.5-2.5 (3) MG/3ML IN SOLN
9.0000 mL | Freq: Once | RESPIRATORY_TRACT | Status: AC
  Administered 2022-06-28: 9 mL via RESPIRATORY_TRACT
  Filled 2022-06-28: qty 3

## 2022-06-28 MED ORDER — POTASSIUM CHLORIDE CRYS ER 20 MEQ PO TBCR
40.0000 meq | EXTENDED_RELEASE_TABLET | Freq: Once | ORAL | Status: AC
  Administered 2022-06-28: 40 meq via ORAL
  Filled 2022-06-28: qty 2

## 2022-06-28 MED ORDER — DILTIAZEM HCL ER COATED BEADS 120 MG PO CP24
240.0000 mg | ORAL_CAPSULE | Freq: Two times a day (BID) | ORAL | Status: DC
  Administered 2022-06-28: 240 mg via ORAL
  Filled 2022-06-28 (×2): qty 2

## 2022-06-28 MED ORDER — DILTIAZEM HCL 25 MG/5ML IV SOLN
10.0000 mg | Freq: Once | INTRAVENOUS | Status: AC
  Administered 2022-06-28: 10 mg via INTRAVENOUS

## 2022-06-28 MED ORDER — ALBUTEROL SULFATE HFA 108 (90 BASE) MCG/ACT IN AERS: 1.0000 | INHALATION_SPRAY | RESPIRATORY_TRACT | Status: DC | PRN

## 2022-06-28 MED ORDER — IPRATROPIUM-ALBUTEROL 0.5-2.5 (3) MG/3ML IN SOLN
RESPIRATORY_TRACT | Status: AC
  Filled 2022-06-28: qty 6

## 2022-06-28 MED ORDER — MAGNESIUM OXIDE -MG SUPPLEMENT 400 (240 MG) MG PO TABS
800.0000 mg | ORAL_TABLET | Freq: Once | ORAL | Status: AC
  Administered 2022-06-28: 800 mg via ORAL
  Filled 2022-06-28: qty 2

## 2022-06-28 MED ORDER — POTASSIUM CHLORIDE CRYS ER 20 MEQ PO TBCR
40.0000 meq | EXTENDED_RELEASE_TABLET | Freq: Two times a day (BID) | ORAL | Status: DC
  Administered 2022-06-28 – 2022-07-02 (×8): 40 meq via ORAL
  Filled 2022-06-28 (×8): qty 2

## 2022-06-28 MED ORDER — DILTIAZEM HCL 25 MG/5ML IV SOLN
INTRAVENOUS | Status: AC
  Filled 2022-06-28: qty 5

## 2022-06-28 MED ORDER — ALBUTEROL SULFATE (2.5 MG/3ML) 0.083% IN NEBU
2.5000 mg | INHALATION_SOLUTION | RESPIRATORY_TRACT | Status: DC | PRN
  Administered 2022-06-29 (×2): 2.5 mg via RESPIRATORY_TRACT
  Filled 2022-06-28 (×2): qty 3

## 2022-06-28 MED ORDER — CITALOPRAM HYDROBROMIDE 20 MG PO TABS
20.0000 mg | ORAL_TABLET | Freq: Every day | ORAL | Status: DC
  Administered 2022-06-29 – 2022-07-02 (×4): 20 mg via ORAL
  Filled 2022-06-28 (×4): qty 1

## 2022-06-28 MED ORDER — POLYETHYLENE GLYCOL 3350 17 G PO PACK: 17.0000 g | PACK | Freq: Every day | ORAL | Status: DC | PRN

## 2022-06-28 MED ORDER — SODIUM CHLORIDE 0.9 % IV SOLN
2.0000 g | Freq: Once | INTRAVENOUS | Status: AC
  Administered 2022-06-28: 2 g via INTRAVENOUS
  Filled 2022-06-28: qty 20

## 2022-06-28 NOTE — ED Triage Notes (Addendum)
Pt via POV w family reporting several days of fatigue, dysuria, burning with urination, chills. Pt notes that she did have a fall this morning but denies injury and denies hitting head. Pt takes blood thinners. Baseline O2 requirement 3L. BP noted 80s/50s in triage after checking twice.

## 2022-06-28 NOTE — H&P (Signed)
History and Physical    Patient: Marissa Turner:[id number] DOB: [birth year] DOA: 06/28/2022 DOS: the patient was seen and examined on 06/28/2022 PCP: Benita Stabile, MD  Patient coming from: Home  Chief Complaint:  Chief Complaint  Patient presents with   Dysuria   Fatigue   HPI: Marissa Turner is a 82 y.o. female with medical history significant of atrial fibrillation on chronic anticoagulation, COPD, hypertension.  Patient brought to the hospital for fatigue and dysuria that started a couple days ago.  Her symptoms have been worsening.  In addition, she has been mildly confused, which was confirmed by the patient's daughter-in-law who participated in the interview.  Her daughter-in-law tried to get her to come sooner, but the patient finally agreed to come today.  Initially, the patient did not have any fevers or chills.  Since being in the emergency department, she has developed a fever.  In addition, on her first arrival she was hypotensive with a systolic blood pressure in the 80s.  This responded to fluids with improvement.  Review of Systems: As mentioned in the history of present illness. All other systems reviewed and are negative. Past Medical History:  Diagnosis Date   Atrial fibrillation, chronic (HCC) 2004   left atrial thrombus in 2004; near syncope in 2008; echocardiogram in 2008-normal EF, mild left atrial enlargement, no valvular abnormalities, lipomatous hypertrophy; no aortic atheroma on TEE in 2004   Cancer (HCC)    LYMPH NODE    and lung cancer   Chronic anticoagulation 2004   warfarin   COPD (chronic obstructive pulmonary disease) (HCC)    denies   Depression    DJD (degenerative joint disease), lumbosacral    Also hip   Dysrhythmia    chronic AFib   Family history of adverse reaction to anesthesia    sister PONV   Fracture of wrist    Left   History of kidney stones    once   Hypotension    Panic attacks    Peptic ulcer disease    s/p Billroth II   PONV  (postoperative nausea and vomiting)    Tobacco abuse    40 pack years; quit attempt in 2012   Past Surgical History:  Procedure Laterality Date   ABDOMINAL HYSTERECTOMY     w/o oophorectomy  partial   APPENDECTOMY     CESAREAN SECTION     CHOLECY[facility]OMY  2004   [facility]   COLONOSCOPY  1980s   patient denies ever having undergone colonoscopy as of 2014   GASTROJEJUNOSTOMY  1984   Billroth II; splenectomy; incidental appendectomy   KNEE ARTHROSCOPY WITH MEDIAL MENISECTOMY Right 02/26/2015   Procedure: KNEE ARTHROSCOPY WITH MEDIAL MENISECTOMY;  Surgeon: Darreld Mclean, MD;  Location: [facility];  Service: Orthopedics;  Laterality: Right;   LYMPH NODE BIOPSY Left 09/15/2013   Procedure: LEFT NECK CERVICAL NODE BIOPSY;  Surgeon: Marlane Hatcher, MD;  Location: [facility];  Service: General;  Laterality: Left;   PORT-A-CATH REMOVAL Right 07/12/2014   Procedure: REMOVAL PORT-A-CATH;  Surgeon: Franky Macho Md, MD;  Location: [facility];  Service: General;  Laterality: Right;   PORTACATH PLACEMENT Right 10/21/2013   Procedure: ATTEMPTED INSERTION PORT-A-CATH;  Surgeon: Marlane Hatcher, MD;  Location: [facility];  Service: General;  Laterality: Right;   SPLENECTOMY, TOTAL     THORACOSCOPY Left 04/15/2018   VIDEO ASSISTED THORACOSCOPY (VATS)/LEFT LOWER LOBECTOMY (Left)   TONSILLECTOMY     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  TOTAL HIP ARTHROPLASTY Left 10/18/2018   Procedure: TOTAL HIP ARTHROPLASTY ANTERIOR APPROACH;  Surgeon: Ollen Gross, MD;  Location: [facility];  Service: Orthopedics;  Laterality: Left;    TOTAL KNEE ARTHROPLASTY Right 07/25/2019   Procedure: TOTAL KNEE ARTHROPLASTY;  Surgeon: Ollen Gross, MD;  Location: [facility];  Service: Orthopedics;  Laterality: Right;    VIDEO ASSISTED THORACOSCOPY (VATS)/ LOBECTOMY Left 04/15/2018   Procedure: VIDEO ASSISTED THORACOSCOPY (VATS)/LEFT LOWER LOBECTOMY;  Surgeon: Loreli Slot, MD;  Location[facility] OR;  Service: Thoracic;  Laterality:  Left;   WEDGE RESECTION Left 04/15/2018   Procedure: LEFT UPPER WEDGE RESECTION;  Surgeon: Loreli Slot, MD;  Location[facility] OR;  Service: Thoracic;  Laterality: Left;   Social History:  reports that she has quit smoking. Her smoking use included cigarettes. She has a 27.50 pack-year smoking history. She has never used smokeless tobacco. She reports that she does not drink alcohol and does not use drugs.  Allergies  Allergen Reactions   Estrogens Swelling    edema   Codeine Swelling and Rash   Penicillins Other (See Comments)    Break out in welts. Did it involve swelling of the face/tongue/throat, SOB, or low BP? No Did it involve sudden or severe rash/hives, skin peeling, or any reaction on the inside of your mouth or nose? No Did you need to seek medical attention at a hospital or doctor's office? No When did it last happen?  Childhood allergy      If all above answers are "NO", may proceed with cephalosporin use.  Tolerated Cephalosporin Date: 07/26/19.     Diphenhydramine Hcl Other (See Comments)    hallucinations     Family History  Problem Relation Age of Onset   Heart attack Mother        also brother   Breast cancer Sister    Heart attack Brother    Breast cancer Sister        x2   Cancer Brother        Gastric carcinoma; also father   Cirrhosis Sister     Prior to Admission medications   Medication Sig Start Date End Date Taking? Authorizing Provider  albuterol (VENTOLIN HFA) 108 (90 Base) MCG/ACT inhaler Inhale 1-2 puffs into the lungs every 4 (four) hours as needed for wheezing or shortness of breath.   Yes [provider]  ALPRAZolam Prudy Feeler) 1 MG tablet Take 1 tablet (1 mg total) by mouth 3 (three) times daily as needed for anxiety. Patient taking differently: Take 1 mg by mouth at bedtime. 09/04/15  Yes Kefalas, Maurine Minister, PA-C  apixaban (ELIQUIS) 5 MG TABS tablet TAKE ONE TABLET (5MG  TOTAL) BY MOUTH TWOTIMES DAILY Patient taking differently:  Take 5 mg by mouth 2 (two) times daily. Pt takes 1 tablet daily 12/03/21  Yes Pemberton, Kathlynn Grate, MD  citalopram (CELEXA) 20 MG tablet Take 20 mg by mouth daily.   Yes [provider]  diltiazem (CARDIZEM CD) 240 MG 24 hr capsule Take 1 capsule (240 mg total) by mouth daily. Patient taking differently: Take 240 mg by mouth 2 (two) times daily. 12/03/21  Yes Meriam Sprague, MD  Multiple Vitamin (MULTIVITAMIN) capsule Take 1 capsule by mouth daily.   Yes [provider]  acetaminophen (TYLENOL) 650 MG CR tablet Take 1,300 mg by mouth every evening.     [provider]  benzonatate (TESSALON PERLES) 100 MG capsule Take 100 mg by mouth 3 (three) times daily as needed  for cough. Patient not taking: Reported on 06/28/2022 03/31/22   [provider]  pantoprazole (PROTONIX) 40 MG tablet Take 1 tablet (40 mg total) by mouth daily for 14 days. Patient not taking: Reported on 06/28/2022 05/08/22 05/22/22  Burnadette Pop, MD    Physical Exam: Vitals:   06/28/22 1430 06/28/22 1500 06/28/22 1530 06/28/22 1605  BP: 110/67 110/73 124/75   Pulse: 92 91 94   Resp: (!) 22 (!) 27 (!) 22   Temp:    100 F (37.8 C)  TempSrc:    Oral  SpO2: 97% 100% 96%   Weight:      Height:       General: Elderly female. Awake and alert and oriented x3. No acute cardiopulmonary distress.  HEENT: Normocephalic atraumatic.  Right and left ears normal in appearance.  Pupils equal, round, reactive to light. Extraocular muscles are intact. Sclerae anicteric and noninjected.  Moist mucosal membranes. No mucosal lesions.  Neck: Neck supple without lymphadenopathy. No carotid bruits. No masses palpated.  Cardiovascular: Regular rate with normal S1-S2 sounds. No murmurs, rubs, gallops auscultated. No JVD.  Respiratory: Good respiratory effort with no wheezes, rales, rhonchi. Lungs clear to auscultation bilaterally.  No accessory muscle use. Abdomen: Soft, nontender, nondistended. Active bowel  sounds. No masses or hepatosplenomegaly  Skin: No rashes, lesions, or ulcerations.  Dry, warm to touch. 2+ dorsalis pedis and radial pulses. Musculoskeletal: No calf or leg pain. All major joints not erythematous nontender.  No upper or lower joint deformation.  Good ROM.  No contractures  Psychiatric: Intact judgment and insight. Pleasant and cooperative. Neurologic: No focal neurological deficits. Strength is 5/5 and symmetric in upper and lower extremities.  Cranial nerves II through XII are grossly intact.  Data Reviewed: Results for orders placed or performed during the hospital encounter of 06/28/22 (from the past 24 hour(s))  POC CBG, ED     Status: Abnormal   Collection Time: 06/28/22 11:59 AM  Result Value Ref Range   Glucose-Capillary 131 (H) 70 - 99 mg/dL  Comprehensive metabolic panel     Status: Abnormal   Collection Time: 06/28/22 12:01 PM  Result Value Ref Range   Sodium 131 (L) 135 - 145 mmol/L   Potassium 2.9 (L) 3.5 - 5.1 mmol/L   Chloride 90 (L) 98 - 111 mmol/L   CO2 29 22 - 32 mmol/L   Glucose, Bld 114 (H) 70 - 99 mg/dL   BUN 28 (H) 8 - 23 mg/dL   Creatinine, Ser 1.61 (H) 0.44 - 1.00 mg/dL   Calcium 7.9 (L) 8.9 - 10.3 mg/dL   Total Protein 7.2 6.5 - 8.1 g/dL   Albumin 3.4 (L) 3.5 - 5.0 g/dL   AST 25 15 - 41 U/L   ALT 32 0 - 44 U/L   Alkaline Phosphatase 72 38 - 126 U/L   Total Bilirubin 0.9 0.3 - 1.2 mg/dL   GFR, Estimated 42 (L) >60 mL/min   Anion gap 12 5 - 15  CBC with Differential     Status: Abnormal   Collection Time: 06/28/22 12:01 PM  Result Value Ref Range   WBC 22.0 (H) 4.0 - 10.5 K/uL   RBC 4.26 3.87 - 5.11 MIL/uL   Hemoglobin 12.0 12.0 - 15.0 g/dL   HCT 09.6 04.5 - 40.9 %   MCV 87.1 80.0 - 100.0 fL   MCH 28.2 26.0 - 34.0 pg   MCHC 32.3 30.0 - 36.0 g/dL   RDW 81.1 (H) 91.4 - 78.2 %  Platelets 242 150 - 400 K/uL   nRBC 0.0 0.0 - 0.2 %   Neutrophils Relative % 72 %   Neutro Abs 15.7 (H) 1.7 - 7.7 K/uL   Lymphocytes Relative 14 %   Lymphs  Abs 3.1 0.7 - 4.0 K/uL   Monocytes Relative 11 %   Monocytes Absolute 2.5 (H) 0.1 - 1.0 K/uL   Eosinophils Relative 1 %   Eosinophils Absolute 0.3 0.0 - 0.5 K/uL   Basophils Relative 0 %   Basophils Absolute 0.1 0.0 - 0.1 K/uL   Immature Granulocytes 2 %   Abs Immature Granulocytes 0.35 (H) 0.00 - 0.07 K/uL  Lactic acid, plasma     Status: None   Collection Time: 06/28/22 12:02 PM  Result Value Ref Range   Lactic Acid, Venous 1.9 0.5 - 1.9 mmol/L  Troponin I (High Sensitivity)     Status: Abnormal   Collection Time: 06/28/22 12:10 PM  Result Value Ref Range   Troponin I (High Sensitivity) 83 (H) <18 ng/L  Blood culture (routine x 2)     Status: None (Preliminary result)   Collection Time: 06/28/22 12:13 PM   Specimen: Left Antecubital; Blood  Result Value Ref Range   Specimen Description      LEFT ANTECUBITAL BOTTLES DRAWN AEROBIC AND ANAEROBIC   Special Requests      Blood Culture results may not be optimal due to an excessive volume of blood received in culture bottles Performed at [facility], [address] [place], [place] [address]    Culture PENDING    Report Status PENDING   Blood culture (routine x 2)     Status: None (Preliminary result)   Collection Time: 06/28/22 12:18 PM   Specimen: Right Antecubital; Blood  Result Value Ref Range   Specimen Description      RIGHT ANTECUBITAL BOTTLES DRAWN AEROBIC AND ANAEROBIC   Special Requests      Blood Culture results may not be optimal due to an excessive volume of blood received in culture bottles Performed at [facility], [address], [place], [place] [address]    Culture PENDING    Report Status PENDING   Blood gas, venous (at [facility] and [facility])     Status: Abnormal   Collection Time: 06/28/22 12:30 PM  Result Value Ref Range   pH, Ven 7.44 (H) 7.25 - 7.43   pCO2, Ven 49 44 - 60 mmHg   pO2, Ven 38 32 - 45 mmHg   Bicarbonate 34.0 (H) 20.0 - 28.0 mmol/L   Acid-Base Excess 8.3 (H) 0.0 - 2.0 mmol/L   O2 Saturation 64.3 %    Patient temperature 36.7    Collection site LEFT ANTECUBITAL    Drawn by [phone]   Urinalysis, Routine w reflex microscopic -Urine, Clean Catch     Status: Abnormal   Collection Time: 06/28/22  1:46 PM  Result Value Ref Range   Color, Urine AMBER (A) YELLOW   APPearance CLOUDY (A) CLEAR   Specific Gravity, Urine 1.014 1.005 - 1.030   pH 5.0 5.0 - 8.0   Glucose, UA NEGATIVE NEGATIVE mg/dL   Hgb urine dipstick NEGATIVE NEGATIVE   Bilirubin Urine NEGATIVE NEGATIVE   Ketones, ur NEGATIVE NEGATIVE mg/dL   Protein, ur 914 (A) NEGATIVE mg/dL   Nitrite NEGATIVE NEGATIVE   Leukocytes,Ua LARGE (A) NEGATIVE   RBC / HPF 6-10 0 - 5 RBC/hpf   WBC, UA >50 0 - 5 WBC/hpf   Bacteria, UA MANY (A) NONE SEEN  Squamous Epithelial / HPF 0-5 0 - 5 /HPF   WBC Clumps PRESENT    Mucus PRESENT    Hyaline Casts, UA PRESENT    Amorphous Crystal PRESENT   Lactic acid, plasma     Status: Abnormal   Collection Time: 06/28/22  1:52 PM  Result Value Ref Range   Lactic Acid, Venous 2.1 (HH) 0.5 - 1.9 mmol/L  Troponin I (High Sensitivity)     Status: Abnormal   Collection Time: 06/28/22  1:52 PM  Result Value Ref Range   Troponin I (High Sensitivity) 55 (H) <18 ng/L    DG Chest Portable 1 View  Result Date: 06/28/2022 CLINICAL DATA:  Dyspnea, fatigue EXAM: PORTABLE CHEST 1 VIEW COMPARISON:  05/05/2022 FINDINGS: Transverse diameter of heart is increased. There are no signs of areolar pulmonary edema. Small left pleural effusion is seen. There are linear densities in left lower lung field. Right lateral CP angle is clear. There is no pneumothorax. IMPRESSION: Cardiomegaly. Linear densities in left lower lung field may suggest scarring or subsegmental atelectasis. Small left pleural effusion. Electronically Signed   By: Ernie Avena M.D.   On: 06/28/2022 13:51     Assessment and Plan: No notes have been filed under this hospital service. Service: Hospitalist  Principal Problem:   Sepsis secondary to UTI  [facility]) Active Problems:   Atrial fibrillation, chronic (HCC)   Chronic anticoagulation   Hypertension   Hypokalemia   AKI (acute kidney injury) (HCC)  Sepsis secondary to UTI Meets criteria due to infection with endorgan damage including encephalopathy and AKI Patient received IV fluid bolus x 2 L Cultures obtained Rocephin 2 g every 24 hours Recheck CBC in the morning AKI IV fluids Recheck serum creatinine in the morning AFib Continue Eliquis Continue Cardizem Hypertension Hypokalemia Potassium replacement, recheck in the morning. Will also check magnesium   Advance Care Planning:   Code Status: DNR confirmed by patient  Consults: None  Family Communication: Patient's daughter-in-law present during interview and exam  Severity of Illness: The appropriate patient status for this patient is INPATIENT. Inpatient status is judged to be reasonable and necessary in order to provide the required intensity of service to ensure the patient's safety. The patient's presenting symptoms, physical exam findings, and initial radiographic and laboratory data in the context of their chronic comorbidities is felt to place them at high risk for further clinical deterioration. Furthermore, it is not anticipated that the patient will be medically stable for discharge from the hospital within 2 midnights of admission.   * I certify that at the point of admission it is my clinical judgment that the patient will require inpatient hospital care spanning beyond 2 midnights from the point of admission due to high intensity of service, high risk for further deterioration and high frequency of surveillance required.*  Author: Levie Heritage, DO 06/28/2022 4:11 PM  For on call review [email or web address].

## 2022-06-28 NOTE — Progress Notes (Signed)
   06/28/22 1759  Vitals  Temp 99 F (37.2 C)  Temp Source Oral  BP 112/81  MAP (mmHg) 92  BP Location Left Arm  BP Method Automatic  Patient Position (if appropriate) Lying  Pulse Rate (!) 106  Pulse Rate Source Dinamap  Resp (!) 24  Level of Consciousness  Level of Consciousness Alert  MEWS COLOR  MEWS Score Color Yellow  Oxygen Therapy  SpO2 94 %  O2 Device Nasal Cannula  O2 Flow Rate (L/min) 3 L/min  Pain Assessment  Pain Scale 0-10  Pain Score 0  MEWS Score  MEWS Temp 0  MEWS Systolic 0  MEWS Pulse 1  MEWS RR 1  MEWS LOC 0  MEWS Score 2   Pt A&O x4, requesting to eat supper tray that has been delivered. States, "I feel good now!". Skin warm and moist, temp down, pt noted to have sweat soaked gown and linens. Gown and linens changed for pt comfort. IVF infusing without difficulty, pt with no complaint of SOB but RR remains 24/min, SaO2 94%.

## 2022-06-28 NOTE — Progress Notes (Signed)
   06/28/22 1641  Vitals  Temp (!) 103.1 F (39.5 C)  Temp Source Oral  BP (!) 149/72  MAP (mmHg) 95  BP Location Left Arm  BP Method Automatic  Patient Position (if appropriate) Lying  Pulse Rate (!) 115  Resp (!) 32  Level of Consciousness  Level of Consciousness Responds to Voice  MEWS COLOR  MEWS Score Color Red  Oxygen Therapy  SpO2 94 %  O2 Device Nasal Cannula  O2 Flow Rate (L/min) 3 L/min  Pain Assessment  Pain Scale 0-10  Pain Score 0  Height and Weight  Height 5' (1.524 m)  Weight in (lb) to have BMI = 25 127.7  MEWS Score  MEWS Temp 2  MEWS Systolic 0  MEWS Pulse 2  MEWS RR 2  MEWS LOC 1  MEWS Score 7  Provider Notification  Provider Name/Title Stinson  Date Provider Notified 06/28/22  Time Provider Notified 1647  Method of Notification Page  Notification Reason Change in status   Pt arrived to unit from ED via stretcher. Moved from stretcher to bed by staff. Pt oriented to person only. Mumbled conversation but denies c/o pain but does c/o being cold. Restless. Oral temp checked and resulted as above. Pt with hard shivers and tremor. Pt did receive oral tylenol in ED prior to transport. Resp fast and shallow, expiratory wheezes noted upper chest. O2 on @ 3lpm [place]. HR very irregular, rate varying from 115 -140 bpm. Family member states no history of irregular heart beat when questioned. MD Stinson notified by [facility] page of current pt condition and change in status from ED report.

## 2022-06-28 NOTE — ED Notes (Signed)
Rt iv occluded. Pt's armed kobanned at this time.

## 2022-06-28 NOTE — ED Notes (Signed)
Hospitalist at bedside 

## 2022-06-28 NOTE — Progress Notes (Signed)
   06/28/22 1724  Vitals  BP 105/62  MAP (mmHg) 76  BP Location Left Arm  BP Method Automatic  Patient Position (if appropriate) Lying  Pulse Rate (!) 105  Pulse Rate Source Monitor  Resp (!) 24  Level of Consciousness  Level of Consciousness Alert  MEWS COLOR  MEWS Score Color Red  Oxygen Therapy  SpO2 94 %  O2 Device Nasal Cannula  O2 Flow Rate (L/min) 3 L/min  Pain Assessment  Pain Scale 0-10  Pain Score 0  MEWS Score  MEWS Temp 2  MEWS Systolic 0  MEWS Pulse 1  MEWS RR 1  MEWS LOC 0  MEWS Score 4   IV Cardizem admin'd per MD order. HR is down to 100-105. B/P as above. Pt is responding more appropriately, able to carry on conversation, A&O x4. Pt states, "I don't know what happened to me. I thought I was getting better and then I got so cold and sleepy!" Pt with noted sweat beads on forehead, states she's not as cold as before, no shivering noted. MD Adrian Blackwater requested pt's 2200 dose of Cardizem po be admin'd now, with further orders to monitor heart rate closely for next 1.5 hours and notify him if HR sustains > 110. Pt is on telemetry per order.

## 2022-06-28 NOTE — ED Provider Notes (Signed)
[place] EMERGENCY DEPARTMENT AT [facility] Provider Note  CSN: [id number] Arrival date & time: 06/28/22 1144  Chief Complaint(s) Dysuria and Fatigue  HPI Marissa Turner is a 82 y.o. female with PMH A-fib on anticoagulation, COPD, HTN who presents emergency department for evaluation of fatigue, foul-smelling urine and mild intermittent confusion.  History obtained from patient's daughter-in-law who states that patient symptoms have been gradually worsening over the last few days and she is concerned given patient's husband is also sick and has difficulty caring for her.  Patient arrives hypotensive in the 80s but is alert and answering questions.  Denies chest pain, shortness of breath, headache, fever or other systemic symptoms.   Past Medical History Past Medical History:  Diagnosis Date   Atrial fibrillation, chronic (HCC) 2004   left atrial thrombus in 2004; near syncope in 2008; echocardiogram in 2008-normal EF, mild left atrial enlargement, no valvular abnormalities, lipomatous hypertrophy; no aortic atheroma on TEE in 2004   Cancer (HCC)    LYMPH NODE    and lung cancer   Chronic anticoagulation 2004   warfarin   COPD (chronic obstructive pulmonary disease) (HCC)    denies   Depression    DJD (degenerative joint disease), lumbosacral    Also hip   Dysrhythmia    chronic AFib   Family history of adverse reaction to anesthesia    sister PONV   Fracture of wrist    Left   History of kidney stones    once   Hypotension    Panic attacks    Peptic ulcer disease    s/p Billroth II   PONV (postoperative nausea and vomiting)    Tobacco abuse    40 pack years; quit attempt in 2012   Patient Active Problem List   Diagnosis Date Noted   Acute respiratory failure with hypoxia (HCC) 05/06/2022   Acute bronchitis 05/06/2022   Recurrent falls 05/06/2022   Elevated brain natriuretic peptide (BNP) level 05/06/2022   Hypokalemia 05/06/2022   Hypoalbuminemia due to  protein-calorie malnutrition (HCC) 05/06/2022   Acute hypoxic respiratory failure (HCC) 05/06/2022   Erroneous encounter - disregard 11/26/2021   Primary osteoarthritis of right knee 07/25/2019   OA (osteoarthritis) of hip 10/18/2018   Squamous cell lung cancer, 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   Hamartoma of lung (HCC) 04/20/2018   S/P Left Lower Lobectomy, Wedge Resection LUL 04/15/2018   Positive colorectal cancer screening using Cologuard test 03/22/2018   Depression 07/25/2015   Knee pain, right 03/22/2015   Iron deficiency 10/17/2013   Lymphoma malignant, 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 Authorizing Provider  albuterol (VENTOLIN HFA) 108 (90 Base) MCG/ACT inhaler Inhale 1-2 puffs into the lungs every 4 (four) hours as needed for wheezing or shortness of breath.   Yes [provider]  ALPRAZolam Prudy Feeler) 1 MG tablet Take 1 tablet (1 mg total) by mouth 3 (three) times daily as needed for anxiety. Patient taking differently: Take 1 mg by mouth at bedtime. 09/04/15  Yes Kefalas, Maurine Minister, PA-C  apixaban (ELIQUIS) 5 MG TABS tablet TAKE ONE TABLET (5MG  TOTAL) BY MOUTH TWOTIMES DAILY Patient taking differently:  Take 5 mg by mouth 2 (two) times daily. Pt takes 1 tablet daily 12/03/21  Yes Pemberton, Kathlynn Grate, MD  citalopram (CELEXA) 20 MG tablet Take 20 mg by mouth daily.   Yes [provider]  diltiazem (CARDIZEM CD) 240 MG 24 hr capsule Take 1 capsule (240 mg total) by mouth daily. Patient taking differently: Take 240 mg by mouth 2 (two) times daily. 12/03/21  Yes Meriam Sprague, MD  Multiple Vitamin (MULTIVITAMIN) capsule Take 1 capsule by mouth  daily.   Yes [provider]  acetaminophen (TYLENOL) 650 MG CR tablet Take 1,300 mg by mouth every evening.     [provider]  benzonatate (TESSALON PERLES) 100 MG capsule Take 100 mg by mouth 3 (three) times daily as needed for cough. Patient not taking: Reported on 06/28/2022 03/31/22   [provider]  pantoprazole (PROTONIX) 40 MG tablet Take 1 tablet (40 mg total) by mouth daily for 14 days. Patient not taking: Reported on 06/28/2022 05/08/22 05/22/22  Burnadette Pop, MD                                                                                                                                    Past Surgical History Past Surgical History:  Procedure Laterality Date   ABDOMINAL HYSTERECTOMY     w/o oophorectomy  partial   APPENDECTOMY     CESAREAN SECTION     CHOLECYSTECTOMY  2004   [facility]   COLONOSCOPY  1980s   patient denies ever having undergone colonoscopy as of 2014   GASTROJEJUNOSTOMY  1984   Billroth II; splenectomy; incidental appendectomy   KNEE ARTHROSCOPY WITH MEDIAL MENISECTOMY Right 02/26/2015   Procedure: KNEE ARTHROSCOPY WITH MEDIAL MENISECTOMY;  Surgeon: Darreld Mclean, MD;  Location: [facility];  Service: Orthopedics;  Laterality: Right;   LYMPH NODE BIOPSY Left 09/15/2013   Procedure: LEFT NECK CERVICAL NODE BIOPSY;  Surgeon: Marlane Hatcher, MD;  Location: [facility];  Service: General;  Laterality: Left;   PORT-A-CATH REMOVAL Right 07/12/2014   Procedure: REMOVAL PORT-A-CATH;  Surgeon: Franky Macho Md, MD;  Location: [facility];  Service: General;  Laterality: Right;   PORTACATH PLACEMENT Right 10/21/2013   Procedure: ATTEMPTED INSERTION PORT-A-CATH;  Surgeon: Marlane Hatcher, MD;  Location: [facility];  Service: General;  Laterality: Right;   SPLENECTOMY, TOTAL     THORACOSCOPY Left 04/15/2018   VIDEO ASSISTED THORACOSCOPY (VATS)/LEFT LOWER LOBECTOMY (Left)   TONSILLECTOMY     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   TOTAL HIP ARTHROPLASTY Left  10/18/2018   Procedure: TOTAL HIP ARTHROPLASTY ANTERIOR APPROACH;  Surgeon: Ollen Gross, MD;  Location: [facility];  Service: Orthopedics;  Laterality: Left;    TOTAL KNEE ARTHROPLASTY Right 07/25/2019   Procedure: TOTAL KNEE ARTHROPLASTY;  Surgeon: Ollen Gross, MD;  Location: [facility];  Service: Orthopedics;  Laterality: Right;    VIDEO ASSISTED THORACOSCOPY (VATS)/ LOBECTOMY Left 04/15/2018  Procedure: VIDEO ASSISTED THORACOSCOPY (VATS)/LEFT LOWER LOBECTOMY;  Surgeon: Loreli Slot, MD;  Location: [facility] OR;  Service: Thoracic;  Laterality: Left;   WEDGE RESECTION Left 04/15/2018   Procedure: LEFT UPPER WEDGE RESECTION;  Surgeon: Loreli Slot, MD;  Location: [facility] OR;  Service: Thoracic;  Laterality: Left;   Family History Family History  Problem Relation Age of Onset   Heart attack Mother        also brother   Breast cancer Sister    Heart attack Brother    Breast cancer Sister        x2   Cancer Brother        Gastric carcinoma; also father   Cirrhosis Sister     Social History Social History   Tobacco Use   Smoking status: Former    Packs/day: 0.50    Years: 55.00    Additional pack years: 0.00    Total pack years: 27.50    Types: Cigarettes   Smokeless tobacco: Never   Tobacco comments:    quit quit in june  Vaping Use   Vaping Use: Never used  Substance Use Topics   Alcohol use: No   Drug use: No   Allergies Estrogens, Codeine, Penicillins, and Diphenhydramine hcl  Review of Systems Review of Systems  Constitutional:  Positive for fatigue.  Genitourinary:  Positive for dysuria and urgency.    Physical Exam Vital Signs  I have reviewed the triage vital signs BP 111/63   Pulse (!) 103   Temp 98.1 F (36.7 C) (Oral)   Resp 18   Ht 5' (1.524 m)   Wt 74.8 kg   SpO2 95%   BMI 32.22 kg/m   Physical Exam Vitals and nursing note reviewed.  Constitutional:      General: She is not in acute distress.    Appearance: She is  well-developed. She is ill-appearing.  HENT:     Head: Normocephalic and atraumatic.  Eyes:     Conjunctiva/sclera: Conjunctivae normal.  Cardiovascular:     Rate and Rhythm: Normal rate and regular rhythm.     Heart sounds: No murmur heard. Pulmonary:     Effort: Pulmonary effort is normal. No respiratory distress.     Breath sounds: Wheezing present.  Abdominal:     Palpations: Abdomen is soft.     Tenderness: There is abdominal tenderness.  Musculoskeletal:        General: No swelling.     Cervical back: Neck supple.  Skin:    General: Skin is warm and dry.     Capillary Refill: Capillary refill takes less than 2 seconds.  Neurological:     Mental Status: She is alert.  Psychiatric:        Mood and Affect: Mood normal.     ED Results and Treatments Labs (all labs ordered are listed, but only abnormal results are displayed) Labs Reviewed  URINALYSIS, ROUTINE W REFLEX MICROSCOPIC - Abnormal; Notable for the following components:      Result Value   Color, Urine AMBER (*)    APPearance CLOUDY (*)    Protein, ur 100 (*)    Leukocytes,Ua LARGE (*)    Bacteria, UA MANY (*)    All other components within normal limits  COMPREHENSIVE METABOLIC PANEL - Abnormal; Notable for the following components:   Sodium 131 (*)    Potassium 2.9 (*)    Chloride 90 (*)    Glucose, Bld 114 (*)    BUN 28 (*)  Creatinine, Ser 1.27 (*)    Calcium 7.9 (*)    Albumin 3.4 (*)    GFR, Estimated 42 (*)    All other components within normal limits  CBC WITH DIFFERENTIAL/PLATELET - Abnormal; Notable for the following components:   WBC 22.0 (*)    RDW 15.9 (*)    Neutro Abs 15.7 (*)    Monocytes Absolute 2.5 (*)    Abs Immature Granulocytes 0.35 (*)    All other components within normal limits  LACTIC ACID, PLASMA - Abnormal; Notable for the following components:   Lactic Acid, Venous 2.1 (*)    All other components within normal limits  BLOOD GAS, VENOUS - Abnormal; Notable for the  following components:   pH, Ven 7.44 (*)    Bicarbonate 34.0 (*)    Acid-Base Excess 8.3 (*)    All other components within normal limits  CBG MONITORING, ED - Abnormal; Notable for the following components:   Glucose-Capillary 131 (*)    All other components within normal limits  TROPONIN I (HIGH SENSITIVITY) - Abnormal; Notable for the following components:   Troponin I (High Sensitivity) 83 (*)    All other components within normal limits  TROPONIN I (HIGH SENSITIVITY) - Abnormal; Notable for the following components:   Troponin I (High Sensitivity) 55 (*)    All other components within normal limits  CULTURE, BLOOD (ROUTINE X 2)  CULTURE, BLOOD (ROUTINE X 2)  LACTIC ACID, PLASMA                                                                                                                          Radiology DG Chest Portable 1 View  Result Date: 06/28/2022 CLINICAL DATA:  Dyspnea, fatigue EXAM: PORTABLE CHEST 1 VIEW COMPARISON:  05/05/2022 FINDINGS: Transverse diameter of heart is increased. There are no signs of areolar pulmonary edema. Small left pleural effusion is seen. There are linear densities in left lower lung field. Right lateral CP angle is clear. There is no pneumothorax. IMPRESSION: Cardiomegaly. Linear densities in left lower lung field may suggest scarring or subsegmental atelectasis. Small left pleural effusion. Electronically Signed   By: Ernie Avena M.D.   On: 06/28/2022 13:51    Pertinent labs & imaging results that were available during my care of the patient were reviewed by me and considered in my medical decision making (see MDM for details).  Medications Ordered in ED Medications  cefTRIAXone (ROCEPHIN) 2 g in sodium chloride 0.9 % 100 mL IVPB (2 g Intravenous New Bag/Given 06/28/22 1425)  lactated ringers bolus 1,000 mL (has no administration in time range)  lactated ringers bolus 1,000 mL (1,000 mLs Intravenous Bolus 06/28/22 1222)   ipratropium-albuterol (DUONEB) 0.5-2.5 (3) MG/3ML nebulizer solution 9 mL (9 mLs Nebulization Given 06/28/22 1223)  ipratropium-albuterol (DUONEB) 0.5-2.5 (3) MG/3ML nebulizer solution (  Given 06/28/22 1232)  potassium chloride SA (KLOR-CON M) CR tablet 40 mEq (40 mEq Oral Given 06/28/22 1358)  magnesium oxide (MAG-OX)  tablet 800 mg (800 mg Oral Given 06/28/22 1358)                                                                                                                                     Procedures .Critical Care  Performed by: Glendora Score, MD Authorized by: Glendora Score, MD   Critical care provider statement:    Critical care time (minutes):  30   Critical care was necessary to treat or prevent imminent or life-threatening deterioration of the following conditions:  Sepsis, circulatory failure and respiratory failure   Critical care was time spent personally by me on the following activities:  Development of treatment plan with patient or surrogate, discussions with consultants, evaluation of patient's response to treatment, examination of patient, ordering and review of laboratory studies, ordering and review of radiographic studies, ordering and performing treatments and interventions, pulse oximetry, re-evaluation of patient's condition and review of old charts   (including critical care time)  Medical Decision Making / ED Course   This patient presents to the ED for concern of dysuria, fatigue, confusion, this involves an extensive number of treatment options, and is a complaint that carries with it a high risk of complications and morbidity.  The differential diagnosis includes UTI, pyelonephritis, sepsis, electrolyte abnormality, COPD exacerbation, reactive airway disease, pneumonia  MDM: Seen emergency room for evaluation of multiple complaints described above.  Physical exam with wheezing bilaterally and suprapubic tenderness to palpation.  Patient arrives hypotensive in  the 80s but was appropriately fluid responsive after 2 L lactated Ringer's.  Laboratory evaluation with a significant leukocytosis to 22.0, mild hypokalemia to 2.9, BUN 28, creatinine 1.27, pH 7.4, lactic 2.1, initial high-sensitivity troponin 83 but delta troponin 55 likely type II demand ischemia.  Urinalysis concerning for infection with large leuk esterase, greater than 50 white blood cells, many bacteria.  Chest x-ray with mild scarring/atelectasis but no acute pathology.  Ceftriaxone initiated for patient's urinary tract infection and patient received 3 DuoNebs with complete resolution of her wheezing on reevaluation.  Due to concern for urosepsis, patient require hospital admission for completion of IV antibiotics.  Patient then admitted.   Additional history obtained: -Additional history obtained from daughter-in-law -External records from outside source obtained and reviewed including: Chart review including previous notes, labs, imaging, consultation notes   Lab Tests: -I ordered, reviewed, and interpreted labs.   The pertinent results include:   Labs Reviewed  URINALYSIS, ROUTINE W REFLEX MICROSCOPIC - Abnormal; Notable for the following components:      Result Value   Color, Urine AMBER (*)    APPearance CLOUDY (*)    Protein, ur 100 (*)    Leukocytes,Ua LARGE (*)    Bacteria, UA MANY (*)    All other components within normal limits  COMPREHENSIVE METABOLIC PANEL - Abnormal; Notable for the following components:   Sodium 131 (*)    Potassium 2.9 (*)  Chloride 90 (*)    Glucose, Bld 114 (*)    BUN 28 (*)    Creatinine, Ser 1.27 (*)    Calcium 7.9 (*)    Albumin 3.4 (*)    GFR, Estimated 42 (*)    All other components within normal limits  CBC WITH DIFFERENTIAL/PLATELET - Abnormal; Notable for the following components:   WBC 22.0 (*)    RDW 15.9 (*)    Neutro Abs 15.7 (*)    Monocytes Absolute 2.5 (*)    Abs Immature Granulocytes 0.35 (*)    All other components  within normal limits  LACTIC ACID, PLASMA - Abnormal; Notable for the following components:   Lactic Acid, Venous 2.1 (*)    All other components within normal limits  BLOOD GAS, VENOUS - Abnormal; Notable for the following components:   pH, Ven 7.44 (*)    Bicarbonate 34.0 (*)    Acid-Base Excess 8.3 (*)    All other components within normal limits  CBG MONITORING, ED - Abnormal; Notable for the following components:   Glucose-Capillary 131 (*)    All other components within normal limits  TROPONIN I (HIGH SENSITIVITY) - Abnormal; Notable for the following components:   Troponin I (High Sensitivity) 83 (*)    All other components within normal limits  TROPONIN I (HIGH SENSITIVITY) - Abnormal; Notable for the following components:   Troponin I (High Sensitivity) 55 (*)    All other components within normal limits  CULTURE, BLOOD (ROUTINE X 2)  CULTURE, BLOOD (ROUTINE X 2)  LACTIC ACID, PLASMA      EKG   EKG Interpretation  Date/Time:    Ventricular Rate:    PR Interval:    QRS Duration:   QT Interval:    QTC Calculation:   R Axis:     Text Interpretation:        A-fib with RVR   Imaging Studies ordered: I ordered imaging studies including chest x-ray I independently visualized and interpreted imaging. I agree with the radiologist interpretation   Medicines ordered and prescription drug management: Meds ordered this encounter  Medications   lactated ringers bolus 1,000 mL   ipratropium-albuterol (DUONEB) 0.5-2.5 (3) MG/3ML nebulizer solution 9 mL   ipratropium-albuterol (DUONEB) 0.5-2.5 (3) MG/3ML nebulizer solution    Ether Griffins, Deanna N: cabinet override   potassium chloride SA (KLOR-CON M) CR tablet 40 mEq   magnesium oxide (MAG-OX) tablet 800 mg   cefTRIAXone (ROCEPHIN) 2 g in sodium chloride 0.9 % 100 mL IVPB    Order Specific Question:   Antibiotic Indication:    Answer:   UTI   lactated ringers bolus 1,000 mL    -I have reviewed the patients home  medicines and have made adjustments as needed  Critical interventions Antibiotics, multiple DuoNebs, fluid resuscitation   Cardiac Monitoring: The patient was maintained on a cardiac monitor.  I personally viewed and interpreted the cardiac monitored which showed an underlying rhythm of: A-fib with RVR, rate controlled A-fib  Social Determinants of Health:  Factors impacting patients care include: Lives at home with husband who is also sick, family concern for patient's welfare at home   Reevaluation: After the interventions noted above, I reevaluated the patient and found that they have :improved  Co morbidities that complicate the patient evaluation  Past Medical History:  Diagnosis Date   Atrial fibrillation, chronic (HCC) 2004   left atrial thrombus in 2004; near syncope in 2008; echocardiogram in 2008-normal EF, mild left atrial enlargement,  no valvular abnormalities, lipomatous hypertrophy; no aortic atheroma on TEE in 2004   Cancer (HCC)    LYMPH NODE    and lung cancer   Chronic anticoagulation 2004   warfarin   COPD (chronic obstructive pulmonary disease) (HCC)    denies   Depression    DJD (degenerative joint disease), lumbosacral    Also hip   Dysrhythmia    chronic AFib   Family history of adverse reaction to anesthesia    sister PONV   Fracture of wrist    Left   History of kidney stones    once   Hypotension    Panic attacks    Peptic ulcer disease    s/p Billroth II   PONV (postoperative nausea and vomiting)    Tobacco abuse    40 pack years; quit attempt in 2012      Dispostion: I considered admission for this patient, and due to for urosepsis, patient require hospital admission     Final Clinical Impression(s) / ED Diagnoses Final diagnoses:  None     @PCDICTATION @    Glendora Score, MD 06/28/22 1929

## 2022-06-28 NOTE — ED Notes (Signed)
EKG given to Dr. Posey Rea. Reads "CRITICAL - ACUTE MI." MD requests repeat EKG.

## 2022-06-28 NOTE — ED Notes (Signed)
Pt oral temp 100. MD paged to see about tylenol. Pt does feel warmer to touch attempted a rectal temp reading was 99.0.

## 2022-06-29 DIAGNOSIS — R7881 Bacteremia: Secondary | ICD-10-CM | POA: Diagnosis present

## 2022-06-29 DIAGNOSIS — N390 Urinary tract infection, site not specified: Secondary | ICD-10-CM | POA: Diagnosis present

## 2022-06-29 DIAGNOSIS — A4150 Gram-negative sepsis, unspecified: Secondary | ICD-10-CM | POA: Diagnosis not present

## 2022-06-29 LAB — BLOOD CULTURE ID PANEL (REFLEXED) - BCID2

## 2022-06-29 LAB — CBC
HCT: 35.5 % — ABNORMAL LOW (ref 36.0–46.0)
Hemoglobin: 11 g/dL — ABNORMAL LOW (ref 12.0–15.0)
MCH: 27.8 pg (ref 26.0–34.0)
MCHC: 31 g/dL (ref 30.0–36.0)
MCV: 89.6 fL (ref 80.0–100.0)
Platelets: 248 10*3/uL (ref 150–400)
RBC: 3.96 MIL/uL (ref 3.87–5.11)
RDW: 15.9 % — ABNORMAL HIGH (ref 11.5–15.5)
WBC: 17.8 10*3/uL — ABNORMAL HIGH (ref 4.0–10.5)
nRBC: 0 % (ref 0.0–0.2)

## 2022-06-29 LAB — BASIC METABOLIC PANEL
Anion gap: 9 (ref 5–15)
BUN: 22 mg/dL (ref 8–23)
CO2: 30 mmol/L (ref 22–32)
Calcium: 7.3 mg/dL — ABNORMAL LOW (ref 8.9–10.3)
Chloride: 96 mmol/L — ABNORMAL LOW (ref 98–111)
Creatinine, Ser: 1.01 mg/dL — ABNORMAL HIGH (ref 0.44–1.00)
GFR, Estimated: 56 mL/min — ABNORMAL LOW (ref >60)
Glucose, Bld: 114 mg/dL — ABNORMAL HIGH (ref 70–99)
Potassium: 3.3 mmol/L — ABNORMAL LOW (ref 3.5–5.1)
Sodium: 135 mmol/L (ref 135–145)

## 2022-06-29 LAB — CULTURE, BLOOD (ROUTINE X 2)

## 2022-06-29 LAB — MAGNESIUM: Magnesium: 2 mg/dL (ref 1.7–2.4)

## 2022-06-29 MED ORDER — DILTIAZEM HCL ER COATED BEADS 120 MG PO CP24: 240.0000 mg | ORAL_CAPSULE | Freq: Two times a day (BID) | ORAL | Status: DC

## 2022-06-29 MED ORDER — IPRATROPIUM-ALBUTEROL 0.5-2.5 (3) MG/3ML IN SOLN
RESPIRATORY_TRACT | Status: AC
  Administered 2022-06-29: 3 mL
  Filled 2022-06-29: qty 3

## 2022-06-29 NOTE — Progress Notes (Signed)
PROGRESS NOTE     Marissa Turner, is a 82 y.o. female, DOB - 11-Oct-1940, ZOX:[id number]  Admit date - 06/28/2022   Admitting Physician Levie Heritage, DO  Outpatient Primary MD for the patient is Benita Stabile, MD  LOS - 1  Chief Complaint  Patient presents with   Dysuria   Fatigue        Brief Narrative:   82 y.o. female with medical history significant of atrial fibrillation on chronic anticoagulation, COPD, hypertension and chronic hypoxic respiratory failure supposed to be on 2 L of oxygen via nasal cannula at home admitted on 06/28/2022 with gram-negative rod sepsis secondary to urinary source after presenting with dysuria of at least 2 days duration    -Assessment and Plan: 1)GNR emesis from presumed urinary source---POA -Tmax 103.1 -WBC on admission 22.0 -Patient had tachycardia tachypnea and transient hypotension on admission -Pulmonary blood cultures with gram-negative rod- -continue IV Rocephin pending further urine and blood culture data  2) hypokalemia/hyponatremia/hypochloremia--- the setting of sepsis secondary to UTI -Replace electrolytes and hydrate  3)AKI----acute kidney injury-due to sepsis with transient hypotension as above #1 --creatinine on admission 1.27, - baseline creatinine usually 0.6-0.7 -Continue IV fluids -renally adjust medications, avoid nephrotoxic agents / dehydration  / hypotension  4)PAFib--continue   Eliquis for stroke prophylaxis -Hold Cardizem due to soft BP  5)Social/Ethics--patient is a DNR/DNI  6) COPD with chronic hypoxic respiratory failure--she is supposed to be on 2 L of oxygen via nasal cannula continuously patient admits to noncompliance with oxygen at home -Continue oxygen therapy -No Acute COPD exacerbation at this time, no steroids indicated  7) anxiety disorder----continue Celexa and Xanax   Status is: Inpatient   Disposition: The patient is from: Home              Anticipated d/c is to: Home               Anticipated d/c date is: 1 day              Patient currently is not medically stable to d/c. Barriers: Not Clinically Stable-   Code Status :  -  Code Status: DNR   Family Communication:    NA (patient is alert, awake and coherent)   DVT Prophylaxis  :   - SCDs    apixaban (ELIQUIS) tablet 5 mg   Lab Results  Component Value Date   PLT 248 06/29/2022    Inpatient Medications  Scheduled Meds:  apixaban  5 mg Oral BID   citalopram  20 mg Oral Daily   diltiazem  240 mg Oral BID   potassium chloride  40 mEq Oral BID   Continuous Infusions:  sodium chloride 100 mL/hr at 06/29/22 1417   cefTRIAXone (ROCEPHIN)  IV 2 g (06/29/22 1453)   PRN Meds:.acetaminophen, albuterol, ALPRAZolam, docusate sodium, ondansetron (ZOFRAN) IV, polyethylene glycol   Anti-infectives (From admission, onward)    Start     Dose/Rate Route Frequency Ordered Stop   06/29/22 1400  cefTRIAXone (ROCEPHIN) 2 g in sodium chloride 0.9 % 100 mL IVPB        2 g 200 mL/hr over 30 Minutes Intravenous Every 24 hours 06/28/22 1638 04/29/26 1359   06/28/22 1430  cefTRIAXone (ROCEPHIN) 2 g in sodium chloride 0.9 % 100 mL IVPB        2 g 200 mL/hr over 30 Minutes Intravenous  Once 06/28/22 1419 06/28/22 1509         Subjective: Marissa Turner today has  No chest pain,  - - Voiding well denies flank pain -Fever curve trended down -Has nausea, no emesis  Objective: Vitals:   06/29/22 0955 06/29/22 1015 06/29/22 1349 06/29/22 1501  BP: (!) 87/55 (!) 87/54 100/78 122/67  Pulse: 63 66 68 80  Resp: 16 20 (!) 22 (!) 22  Temp: 98.2 F (36.8 C)  98 F (36.7 C) (!) 97.4 F (36.3 C)  TempSrc: Oral  Oral Oral  SpO2: 94% 94% 97% 94%  Weight:      Height:        Intake/Output Summary (Last 24 hours) at 06/29/2022 1550 Last data filed at 06/29/2022 0900 Gross per 24 hour  Intake 1568.6 ml  Output 900 ml  Net 668.6 ml   Filed Weights   06/28/22 1154 06/29/22 0555  Weight: 74.8 kg 70.8 kg   Physical  Exam  Gen:- Awake Alert, no acute distress HEENT:- [place].AT, No sclera icterus Nose- [place] 2.5 L/min Neck-Supple Neck,No JVD,.  Lungs-  CTAB , fair symmetrical air movement CV- S1, S2 normal, irregularly irregular Abd-  +ve B.Sounds, Abd Soft, No tenderness, no CVA area tenderness Extremity/Skin:- No  edema, pedal pulses present  Psych-affect is appropriate, oriented x3 Neuro-no new focal deficits, no tremors  Data Reviewed: I have personally reviewed following labs and imaging studies  CBC: Recent Labs  Lab 06/28/22 1201 06/29/22 0419  WBC 22.0* 17.8*  NEUTROABS 15.7*  --   HGB 12.0 11.0*  HCT 37.1 35.5*  MCV 87.1 89.6  PLT 242 248   Basic Metabolic Panel: Recent Labs  Lab 06/28/22 1201 06/29/22 0419  NA 131* 135  K 2.9* 3.3*  CL 90* 96*  CO2 29 30  GLUCOSE 114* 114*  BUN 28* 22  CREATININE 1.27* 1.01*  CALCIUM 7.9* 7.3*  MG  --  2.0   GFR: Estimated Creatinine Clearance: 38.3 mL/min (A) (by C-G formula based on SCr of 1.01 mg/dL (H)). Liver Function Tests: Recent Labs  Lab 06/28/22 1201  AST 25  ALT 32  ALKPHOS 72  BILITOT 0.9  PROT 7.2  ALBUMIN 3.4*   Recent Results (from the past 240 hour(s))  Blood culture (routine x 2)     Status: None (Preliminary result)   Collection Time: 06/28/22 12:13 PM   Specimen: Left Antecubital; Blood  Result Value Ref Range Status   Specimen Description LEFT ANTECUBITAL BLOOD  Final   Special Requests   Final    Blood Culture results may not be optimal due to an excessive volume of blood received in culture bottles BOTTLES DRAWN AEROBIC AND ANAEROBIC   Culture  Setup Time   Final    GRAM NEGATIVE RODS Gram Stain Report Called to,Read Back By and Verified With: K.CHEEK @ 0803 BY STEPHTR 06/29/22 IN BOTH AEROBIC AND ANAEROBIC BOTTLES Organism ID to follow Performed at [facility], [address], [place], [place] [address]    Culture GRAM NEGATIVE RODS  Final   Report Status PENDING  Incomplete  Blood culture  (routine x 2)     Status: None (Preliminary result)   Collection Time: 06/28/22 12:18 PM   Specimen: Right Antecubital; Blood  Result Value Ref Range Status   Specimen Description   Final    RIGHT ANTECUBITAL BLOOD Performed at [facility], [address], [place], [place] [address]    Special Requests   Final    Blood Culture results may not be optimal due to an excessive volume of blood received in culture  bottles BOTTLES DRAWN AEROBIC AND ANAEROBIC Performed at [facility], [address] [place], [place] [address]    Culture  Setup Time   Final    GRAM NEGATIVE RODS Gram Stain Report Called to,Read Back By and Verified With:  K. CHEEK @ 0803 BY STEPTR 06/29/22 IN BOTH AEROBIC AND ANAEROBIC BOTTLES Performed at [facility], [address] [place], [place] [address]    Culture GRAM NEGATIVE RODS  Final   Report Status PENDING  Incomplete    Radiology Studies: DG Chest Portable 1 View  Result Date: 06/28/2022 CLINICAL DATA:  Dyspnea, fatigue EXAM: PORTABLE CHEST 1 VIEW COMPARISON:  05/05/2022 FINDINGS: Transverse diameter of heart is increased. There are no signs of areolar pulmonary edema. Small left pleural effusion is seen. There are linear densities in left lower lung field. Right lateral CP angle is clear. There is no pneumothorax. IMPRESSION: Cardiomegaly. Linear densities in left lower lung field may suggest scarring or subsegmental atelectasis. Small left pleural effusion. Electronically Signed   By: Ernie Avena M.D.   On: 06/28/2022 13:51    Scheduled Meds:  apixaban  5 mg Oral BID   citalopram  20 mg Oral Daily   diltiazem  240 mg Oral BID   potassium chloride  40 mEq Oral BID   Continuous Infusions:  sodium chloride 100 mL/hr at 06/29/22 1417   cefTRIAXone (ROCEPHIN)  IV 2 g (06/29/22 1453)    LOS: 1 day   Shon Hale M.D on 06/29/2022 at 3:50 PM  Go to [email or web address] - for contact info  [facility] - Office  [phone]  If  7PM-7AM, please contact night-coverage [email or web address] 06/29/2022, 3:50 PM

## 2022-06-29 NOTE — Progress Notes (Signed)
Patient had yellow mews.  MD notified and received order for bolus.  Patient is stable, no other complaints.

## 2022-06-29 NOTE — Progress Notes (Signed)
Patient was a yellow mews the majority of the night, BP dropped and fluid bolus given per order from Dr. Thomes Dinning and BP improved. Patients temp increased during the night, PRN tylenol given and temp decreased. Patient wheezing during night, respiratory called for a PRN neb treatment, breathing eased and wheezing decreased.Patient now a green mews, and resting comfortably in bed.

## 2022-06-29 NOTE — TOC Initial Note (Signed)
Transition of Care [facility]) - Initial/Assessment Note    Patient Details  Name: Marissa Turner MRN: [id number] Date of Birth: Apr 21, 1940  Transition of Care [facility]) CM/SW Contact:    Catalina Gravel, LCSW Phone Number: 06/29/2022, 12:11 PM  Clinical Narrative:                 Pt requires medical care, treatment. Pt has history of 02, may not use.  Watch for increase need of 02. DC 1-2 days. TOC to continue to follow.    Barriers to Discharge: Continued Medical Work up   Patient Goals and CMS Choice            Expected Discharge Plan and Services                                              Prior Living Arrangements/Services                       Activities of Daily Living Home Assistive Devices/Equipment: Oxygen, Cane (specify quad or straight), Walker (specify type), Scales, Raised toilet seat with rails, Eyeglasses ADL Screening (condition at time of admission) Patient's cognitive ability adequate to safely complete daily activities?: Yes Is the patient deaf or have difficulty hearing?: No Does the patient have difficulty seeing, even when wearing glasses/contacts?: No Does the patient have difficulty concentrating, remembering, or making decisions?: Yes Patient able to express need for assistance with ADLs?: Yes Does the patient have difficulty dressing or bathing?: No Independently performs ADLs?: Yes (appropriate for developmental age) Does the patient have difficulty walking or climbing stairs?: No Weakness of Legs: None Weakness of Arms/Hands: None  Permission Sought/Granted                  Emotional Assessment              Admission diagnosis:  Acute cystitis without hematuria [N30.00] Sepsis secondary to UTI (HCC) [A41.9, N39.0] Patient Active Problem List   Diagnosis Date Noted   Sepsis secondary to UTI (HCC) 06/28/2022   AKI (acute kidney injury) (HCC) 06/28/2022   Acute respiratory failure with hypoxia (HCC) 05/06/2022    Acute bronchitis 05/06/2022   Recurrent falls 05/06/2022   Elevated brain natriuretic peptide (BNP) level 05/06/2022   Hypokalemia 05/06/2022   Hypoalbuminemia due to protein-calorie malnutrition (HCC) 05/06/2022   Acute hypoxic respiratory failure (HCC) 05/06/2022   Erroneous encounter - disregard 11/26/2021   Primary osteoarthritis of right knee 07/25/2019   OA (osteoarthritis) of hip 10/18/2018   Squamous cell lung cancer, 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   Hamartoma of lung (HCC) 04/20/2018   S/P Left Lower Lobectomy, Wedge Resection LUL 04/15/2018   Positive colorectal cancer screening using Cologuard test 03/22/2018   Depression 07/25/2015   Knee pain, right 03/22/2015   Iron deficiency 10/17/2013   Lymphoma malignant, 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   Encounter for therapeutic drug monitoring 02/24/2013   OA (osteoarthritis) of knee    Atrial fibrillation, chronic (HCC)    Chronic anticoagulation    Tobacco abuse    Peptic ulcer disease    Hypertension    SPINAL STENOSIS 02/03/2008   PCP:  Benita Stabile, MD Pharmacy:   [facility] - [place], [place] - [address]  [place] [address] Phone: [phone] Fax: [phone]     Social Determinants of  Health (SDOH) Social History: SDOH Screenings   Food Insecurity: No Food Insecurity (06/28/2022)  Housing: Low Risk  (06/28/2022)  Transportation Needs: No Transportation Needs (06/28/2022)  Utilities: Not At Risk (06/28/2022)  Tobacco Use: Medium Risk (06/28/2022)   SDOH Interventions:     Readmission Risk Interventions     No data to display

## 2022-06-30 DIAGNOSIS — A4150 Gram-negative sepsis, unspecified: Secondary | ICD-10-CM | POA: Diagnosis not present

## 2022-06-30 DIAGNOSIS — N390 Urinary tract infection, site not specified: Secondary | ICD-10-CM | POA: Diagnosis not present

## 2022-06-30 LAB — BASIC METABOLIC PANEL
Anion gap: 7 (ref 5–15)
BUN: 12 mg/dL (ref 8–23)
CO2: 28 mmol/L (ref 22–32)
Calcium: 7.2 mg/dL — ABNORMAL LOW (ref 8.9–10.3)
Chloride: 100 mmol/L (ref 98–111)
Creatinine, Ser: 0.93 mg/dL (ref 0.44–1.00)
GFR, Estimated: >60 mL/min (ref >60)
Glucose, Bld: 103 mg/dL — ABNORMAL HIGH (ref 70–99)
Potassium: 4.3 mmol/L (ref 3.5–5.1)
Sodium: 135 mmol/L (ref 135–145)

## 2022-06-30 LAB — URINE CULTURE: Culture: NO GROWTH

## 2022-06-30 LAB — CULTURE, BLOOD (ROUTINE X 2)

## 2022-06-30 LAB — CBC
HCT: 30.4 % — ABNORMAL LOW (ref 36.0–46.0)
Hemoglobin: 9.7 g/dL — ABNORMAL LOW (ref 12.0–15.0)
MCH: 28.2 pg (ref 26.0–34.0)
MCHC: 31.9 g/dL (ref 30.0–36.0)
MCV: 88.4 fL (ref 80.0–100.0)
Platelets: 251 10*3/uL (ref 150–400)
RBC: 3.44 MIL/uL — ABNORMAL LOW (ref 3.87–5.11)
RDW: 16.1 % — ABNORMAL HIGH (ref 11.5–15.5)
WBC: 13.7 10*3/uL — ABNORMAL HIGH (ref 4.0–10.5)
nRBC: 0 % (ref 0.0–0.2)

## 2022-06-30 MED ORDER — DILTIAZEM HCL 25 MG/5ML IV SOLN
10.0000 mg | Freq: Once | INTRAVENOUS | Status: AC
  Administered 2022-06-30: 10 mg via INTRAVENOUS
  Filled 2022-06-30: qty 5

## 2022-06-30 MED ORDER — SODIUM CHLORIDE 0.9 % IV SOLN: INTRAVENOUS | Status: AC

## 2022-06-30 MED ORDER — DILTIAZEM HCL 30 MG PO TABS
60.0000 mg | ORAL_TABLET | Freq: Four times a day (QID) | ORAL | Status: AC
  Administered 2022-06-30 – 2022-07-01 (×4): 60 mg via ORAL
  Filled 2022-06-30 (×4): qty 2

## 2022-06-30 NOTE — Progress Notes (Signed)
PROGRESS NOTE     Marissa Turner, is a 82 y.o. female, DOB - 22-Apr-1940, ZOX:[id number]  Admit date - 06/28/2022   Admitting Physician No admitting provider for patient encounter.  Outpatient Primary MD for the patient is Benita Stabile, MD  LOS - 2  Chief Complaint  Patient presents with   Dysuria   Fatigue        Brief Narrative:   82 y.o. female with medical history significant of atrial fibrillation on chronic anticoagulation, COPD, hypertension and chronic hypoxic respiratory failure supposed to be on 2 L of oxygen via nasal cannula at home admitted on 06/28/2022 with gram-negative rod sepsis secondary to urinary source after presenting with dysuria of at least 2 days duration    -Assessment and Plan: 1)E coli bacteremia and sepsis from presumed urinary source---POA -Fever curve trending down -WBC 22 >>13.7 -Patient had tachycardia tachypnea and transient hypotension on admission -Blood cultures from 06/28/2022 consistent with E. Coli--sensitivities pending -continue IV Rocephin pending further urine and blood culture data  2)PAFib--continue   Eliquis for stroke prophylaxis 06/30/22 -Patient went into A-fib with RVR advised setting of E. coli bacteremia and sepsis, electrolyte abnormalities and discontinuation of oral Cardizem due to soft BP -Required IV Cardizem x 1 -Restart short acting Cardizem with plans to transition back to her usual long-acting dose of Cardizem  3)AKI----acute kidney injury-due to sepsis with transient hypotension as above #1 --creatinine on admission 1.27, - baseline creatinine usually 0.6-0.7 --Creatinine normalized with hydration -renally adjust medications, avoid nephrotoxic agents / dehydration  / hypotension  4)Hypokalemia/hyponatremia/hypochloremia--- the setting of sepsis secondary to UTI -Electrolytes normalized with replacements and hydration  5)Social/Ethics--patient is a DNR/DNI  6) COPD with chronic hypoxic respiratory failure--she is  supposed to be on 2 L of oxygen via nasal cannula continuously patient admits to noncompliance with oxygen at home -Continue oxygen therapy baseline of 2 to 3 L/min -No Acute COPD exacerbation at this time, no steroids indicated  7) anxiety disorder----continue Celexa and Xanax  Status is: Inpatient   Disposition: The patient is from: Home              Anticipated d/c is to: Home              Anticipated d/c date is: 1 day              Patient currently is not medically stable to d/c. Barriers: Not Clinically Stable-   Code Status :  -  Code Status: DNR   Family Communication:     (patient is alert, awake and coherent)  -Patient's granddaughter Marchelle Folks and daughter-in-law [name] at bedside, questions answered  DVT Prophylaxis  :   - SCDs    apixaban (ELIQUIS) tablet 5 mg   Lab Results  Component Value Date   PLT 251 06/30/2022    Inpatient Medications  Scheduled Meds:  apixaban  5 mg Oral BID   citalopram  20 mg Oral Daily   diltiazem  60 mg Oral Q6H   potassium chloride  40 mEq Oral BID   Continuous Infusions:  cefTRIAXone (ROCEPHIN)  IV 2 g (06/30/22 1409)   PRN Meds:.acetaminophen, albuterol, ALPRAZolam, ondansetron (ZOFRAN) IV, polyethylene glycol   Anti-infectives (From admission, onward)    Start     Dose/Rate Route Frequency Ordered Stop   06/29/22 1400  cefTRIAXone (ROCEPHIN) 2 g in sodium chloride 0.9 % 100 mL IVPB        2 g 200 mL/hr over 30 Minutes Intravenous Every 24  hours 06/28/22 1638 04/29/26 1359   06/28/22 1430  cefTRIAXone (ROCEPHIN) 2 g in sodium chloride 0.9 % 100 mL IVPB        2 g 200 mL/hr over 30 Minutes Intravenous  Once 06/28/22 1419 06/28/22 1509       Subjective: Marissa Turner today has  No chest pain,  - - Patient went into A-fib with RVR with heart rate in the 140s--she was feeling palpitations and dizziness - Improved with IV Cardizem 10 mg x 1 bolus -Added on oral Cardizem -Patient's granddaughter Marchelle Folks and daughter-in-law  [name] at bedside, questions answered  Objective: Vitals:   06/30/22 0911 06/30/22 0913 06/30/22 0958 06/30/22 1436  BP: 136/75  (!) 143/84 125/87  Pulse: (!) 142 (!) 107 (!) 106 75  Resp:    17  Temp: 99.1 F (37.3 C)   98.2 F (36.8 C)  TempSrc: Oral   Oral  SpO2: 93%   94%  Weight:      Height:        Intake/Output Summary (Last 24 hours) at 06/30/2022 1700 Last data filed at 06/30/2022 1100 Gross per 24 hour  Intake 720 ml  Output 1400 ml  Net -680 ml   Filed Weights   06/28/22 1154 06/29/22 0555 06/30/22 0500  Weight: 74.8 kg 70.8 kg 71.2 kg   Physical Exam  Gen:- Awake Alert, no acute distress HEENT:- [place].AT, No sclera icterus Nose- [place] 2.5 L/min Neck-Supple Neck,No JVD,.  Lungs-  CTAB , fair symmetrical air movement CV- S1, S2 normal, irregularly irregular Abd-  +ve B.Sounds, Abd Soft, No tenderness, no CVA area tenderness Extremity/Skin:- No  edema, pedal pulses present  Psych-affect is appropriate, oriented x3 Neuro-no new focal deficits, no tremors  Data Reviewed: I have personally reviewed following labs and imaging studies  CBC: Recent Labs  Lab 06/28/22 1201 06/29/22 0419 06/30/22 0415  WBC 22.0* 17.8* 13.7*  NEUTROABS 15.7*  --   --   HGB 12.0 11.0* 9.7*  HCT 37.1 35.5* 30.4*  MCV 87.1 89.6 88.4  PLT 242 248 251   Basic Metabolic Panel: Recent Labs  Lab 06/28/22 1201 06/29/22 0419 06/30/22 0415  NA 131* 135 135  K 2.9* 3.3* 4.3  CL 90* 96* 100  CO2 29 30 28   GLUCOSE 114* 114* 103*  BUN 28* 22 12  CREATININE 1.27* 1.01* 0.93  CALCIUM 7.9* 7.3* 7.2*  MG  --  2.0  --    GFR: Estimated Creatinine Clearance: 41.8 mL/min (by C-G formula based on SCr of 0.93 mg/dL). Liver Function Tests: Recent Labs  Lab 06/28/22 1201  AST 25  ALT 32  ALKPHOS 72  BILITOT 0.9  PROT 7.2  ALBUMIN 3.4*   Recent Results (from the past 240 hour(s))  Blood culture (routine x 2)     Status: Abnormal (Preliminary result)   Collection Time: 06/28/22 12:13  PM   Specimen: Left Antecubital; Blood  Result Value Ref Range Status   Specimen Description LEFT ANTECUBITAL BLOOD  Final   Special Requests   Final    Blood Culture results may not be optimal due to an excessive volume of blood received in culture bottles BOTTLES DRAWN AEROBIC AND ANAEROBIC   Culture  Setup Time   Final    GRAM NEGATIVE RODS Gram Stain Report Called to,Read Back By and Verified With: K.CHEEK @ 0803 BY STEPHTR 06/29/22 IN BOTH AEROBIC AND ANAEROBIC BOTTLES CRITICAL RESULT CALLED TO, READ BACK BY AND VERIFIED WITH: RN KIM CHEEK [id number] BY  Berline Chough, [place]    Culture (A)  Final    ESCHERICHIA COLI SUSCEPTIBILITIES TO FOLLOW Performed at [facility], [address], [place], [place] [address]    Report Status PENDING  Incomplete  Blood Culture ID Panel (Reflexed)     Status: Abnormal   Collection Time: 06/28/22 12:13 PM  Result Value Ref Range Status   Enterococcus faecalis NOT DETECTED NOT DETECTED Final   Enterococcus Faecium NOT DETECTED NOT DETECTED Final   Listeria monocytogenes NOT DETECTED NOT DETECTED Final   Staphylococcus species NOT DETECTED NOT DETECTED Final   Staphylococcus aureus (BCID) NOT DETECTED NOT DETECTED Final   Staphylococcus epidermidis NOT DETECTED NOT DETECTED Final   Staphylococcus lugdunensis NOT DETECTED NOT DETECTED Final   Streptococcus species NOT DETECTED NOT DETECTED Final   Streptococcus agalactiae NOT DETECTED NOT DETECTED Final   Streptococcus pneumoniae NOT DETECTED NOT DETECTED Final   Streptococcus pyogenes NOT DETECTED NOT DETECTED Final   A.calcoaceticus-baumannii NOT DETECTED NOT DETECTED Final   Bacteroides fragilis NOT DETECTED NOT DETECTED Final   Enterobacterales DETECTED (A) NOT DETECTED Final    Comment: Enterobacterales represent a large order of gram negative bacteria, not a single organism. CRITICAL RESULT CALLED TO, READ BACK BY AND VERIFIED WITH: RN KIM CHEEK [id number] 1601 BY J RAZZAK,MT     Enterobacter cloacae complex NOT DETECTED NOT DETECTED Final   Escherichia coli DETECTED (A) NOT DETECTED Final    Comment: CRITICAL RESULT CALLED TO, READ BACK BY AND VERIFIED WITH: RN KIM CHEEK [id number] 1601 BY J RAZZAK, MT    Klebsiella aerogenes NOT DETECTED NOT DETECTED Final   Klebsiella oxytoca NOT DETECTED NOT DETECTED Final   Klebsiella pneumoniae NOT DETECTED NOT DETECTED Final   Proteus species NOT DETECTED NOT DETECTED Final   Salmonella species NOT DETECTED NOT DETECTED Final   Serratia marcescens NOT DETECTED NOT DETECTED Final   Haemophilus influenzae NOT DETECTED NOT DETECTED Final   Neisseria meningitidis NOT DETECTED NOT DETECTED Final   Pseudomonas aeruginosa NOT DETECTED NOT DETECTED Final   Stenotrophomonas maltophilia NOT DETECTED NOT DETECTED Final   Candida albicans NOT DETECTED NOT DETECTED Final   Candida auris NOT DETECTED NOT DETECTED Final   Candida glabrata NOT DETECTED NOT DETECTED Final   Candida krusei NOT DETECTED NOT DETECTED Final   Candida parapsilosis NOT DETECTED NOT DETECTED Final   Candida tropicalis NOT DETECTED NOT DETECTED Final   Cryptococcus neoformans/gattii NOT DETECTED NOT DETECTED Final   CTX-M ESBL NOT DETECTED NOT DETECTED Final   Carbapenem resistance IMP NOT DETECTED NOT DETECTED Final   Carbapenem resistance KPC NOT DETECTED NOT DETECTED Final   Carbapenem resistance NDM NOT DETECTED NOT DETECTED Final   Carbapenem resist OXA 48 LIKE NOT DETECTED NOT DETECTED Final   Carbapenem resistance VIM NOT DETECTED NOT DETECTED Final    Comment: Performed at [facility], [address] [place], [place] [address]  Blood culture (routine x 2)     Status: None (Preliminary result)   Collection Time: 06/28/22 12:18 PM   Specimen: Right Antecubital; Blood  Result Value Ref Range Status   Specimen Description RIGHT ANTECUBITAL BLOOD  Final   Special Requests   Final    Blood Culture results may not be optimal due to an excessive volume  of blood received in culture bottles BOTTLES DRAWN AEROBIC AND ANAEROBIC   Culture  Setup Time   Final    GRAM NEGATIVE RODS Gram Stain Report Called to,Read Back By and Verified  With:  K. CHEEK @ 0803 BY STEPTR 06/29/22 IN BOTH AEROBIC AND ANAEROBIC BOTTLES CRITICAL VALUE NOTED.  VALUE IS CONSISTENT WITH PREVIOUSLY REPORTED AND CALLED VALUE. Performed at [facility], [address], [place], [place] [address]    Culture GRAM NEGATIVE RODS  Final   Report Status PENDING  Incomplete    Radiology Studies: No results found.  Scheduled Meds:  apixaban  5 mg Oral BID   citalopram  20 mg Oral Daily   diltiazem  60 mg Oral Q6H   potassium chloride  40 mEq Oral BID   Continuous Infusions:  cefTRIAXone (ROCEPHIN)  IV 2 g (06/30/22 1409)    LOS: 2 days   Shon Hale M.D on 06/30/2022 at 5:00 PM  Go to [email or web address] - for contact info  [facility] - Office  [phone]  If 7PM-7AM, please contact night-coverage [email or web address] 06/30/2022, 5:00 PM

## 2022-06-30 NOTE — Progress Notes (Signed)
   06/30/22 0911  Assess: MEWS Score  Temp 99.1 F (37.3 C)  BP 136/75  MAP (mmHg) 92  Pulse Rate (!) 142  SpO2 93 %  O2 Device Nasal Cannula  O2 Flow Rate (L/min) 3 L/min  Assess: MEWS Score  MEWS Temp 0  MEWS Systolic 0  MEWS Pulse 3  MEWS RR 0  MEWS LOC 0  MEWS Score 3  MEWS Score Color Yellow  Assess: if the MEWS score is Yellow or Red  Were vital signs taken at a resting state? Yes  Focused Assessment No change from prior assessment  Does the patient meet 2 or more of the SIRS criteria? No  Does the patient have a confirmed or suspected source of infection? Yes  MEWS guidelines implemented  No, vital signs rechecked  Notify: Charge Nurse/RN  Name of Charge Nurse/RN Recruitment consultant , RN  Provider Notification  Provider Name/Title [facility]  Date Provider Notified 06/30/22  Time Provider Notified [phone]  Method of Notification Face-to-face  Notification Reason Other (Comment) (in a fib however has not had cardizem in over 24 hrs d/t soft BP)  Provider response See new orders  Date of Provider Response 06/30/22  Time of Provider Response 0924  Assess: SIRS CRITERIA  SIRS Temperature  0  SIRS Pulse 1  SIRS Respirations  0  SIRS WBC 1  SIRS Score Sum  2

## 2022-06-30 NOTE — Progress Notes (Signed)
Patient in and out of A. Fib with RVR patient not symptomatic her cardizem has been on hold due to infection. Dr Marisa Severin at bedside and aware , recheck and it went back down in 80's   06/30/22 0911  Vitals  Temp 99.1 F (37.3 C)  Temp Source Oral  BP 136/75  MAP (mmHg) 92  BP Location Left Arm  BP Method Automatic  Patient Position (if appropriate) Lying  Pulse Rate (!) 142  MEWS COLOR  MEWS Score Color Yellow  Oxygen Therapy  SpO2 93 %  O2 Device Nasal Cannula  O2 Flow Rate (L/min) 3 L/min  MEWS Score  MEWS Temp 0  MEWS Systolic 0  MEWS Pulse 3  MEWS RR 0  MEWS LOC 0  MEWS Score 3

## 2022-07-01 DIAGNOSIS — N390 Urinary tract infection, site not specified: Secondary | ICD-10-CM | POA: Diagnosis not present

## 2022-07-01 DIAGNOSIS — A4150 Gram-negative sepsis, unspecified: Secondary | ICD-10-CM | POA: Diagnosis not present

## 2022-07-01 LAB — CBC
HCT: 32.8 % — ABNORMAL LOW (ref 36.0–46.0)
Hemoglobin: 10.5 g/dL — ABNORMAL LOW (ref 12.0–15.0)
MCH: 28.2 pg (ref 26.0–34.0)
MCHC: 32 g/dL (ref 30.0–36.0)
MCV: 88.2 fL (ref 80.0–100.0)
Platelets: 303 10*3/uL (ref 150–400)
RBC: 3.72 MIL/uL — ABNORMAL LOW (ref 3.87–5.11)
RDW: 16 % — ABNORMAL HIGH (ref 11.5–15.5)
WBC: 14.8 10*3/uL — ABNORMAL HIGH (ref 4.0–10.5)
nRBC: 0 % (ref 0.0–0.2)

## 2022-07-01 LAB — CULTURE, BLOOD (ROUTINE X 2)

## 2022-07-01 MED ORDER — DILTIAZEM HCL ER COATED BEADS 120 MG PO CP24
240.0000 mg | ORAL_CAPSULE | Freq: Every day | ORAL | Status: DC
  Administered 2022-07-01 – 2022-07-02 (×2): 240 mg via ORAL
  Filled 2022-07-01 (×2): qty 2

## 2022-07-01 MED ORDER — SODIUM CHLORIDE 0.9 % IV SOLN: INTRAVENOUS | Status: DC | PRN

## 2022-07-01 NOTE — Progress Notes (Signed)
PROGRESS NOTE     Marissa Turner, is a 82 y.o. female, DOB - [birth year], ZOX:[id number]  Admit date - 06/28/2022   Admitting Physician No admitting provider for patient encounter.  Outpatient Primary MD for the patient is Benita Stabile, MD  LOS - 3  Chief Complaint  Patient presents with   Dysuria   Fatigue        Brief Narrative:   82 y.o. female with medical history significant of atrial fibrillation on chronic anticoagulation, COPD, hypertension and chronic hypoxic respiratory failure supposed to be on 2 L of oxygen via nasal cannula at home admitted on 06/28/2022 with gram-negative rod sepsis secondary to urinary source after presenting with dysuria of at least 2 days duration    -Assessment and Plan: 1)E coli bacteremia and sepsis from presumed urinary source---POA -Fever curve trending down -WBC 22 >>13.7>>14.8 -Patient had tachycardia tachypnea and transient hypotension on admission -Blood cultures from 06/28/2022 consistent with E. Coli-- -Leukocytosis persist -continue IV Rocephin    2)PAFib--continue   Eliquis for stroke prophylaxis 07/01/22 -Patient went into A-fib with RVR advised setting of E. coli bacteremia and sepsis, electrolyte abnormalities and discontinuation of oral Cardizem due to soft BP -Required IV Cardizem x 1 on 06/30/2022 -Tolerated short acting oral Cardizem on 06/30/2022 -Restart Cardizem CD at 240 mg daily on 07/01/2022  3) acute on chronic hypoxic respiratory failure -PTA patient used 2 to 3 L of oxygen at home 07/01/22 -Today for some reason oxygen came off O2 sats dropped to 68 to 70% -Placed on O2 O2 sats back up to 90 to 92% on 3 to 4 L of oxygen at this time  4)Hypokalemia/hyponatremia/hypochloremia--- the setting of sepsis secondary to UTI -Electrolytes normalized with replacements and hydration  5)Social/Ethics--patient is a DNR/DNI  6) COPD with chronic hypoxic respiratory failure--she is supposed to be on 2 L of oxygen via nasal cannula  continuously patient admits to noncompliance with oxygen at home -Continue oxygen therapy baseline of 2 to 3 L/min -No Acute COPD exacerbation at this time, no steroids indicated  7) anxiety disorder----continue Celexa and Xanax  8)AKI----acute kidney injury-due to sepsis with transient hypotension as above #1 --creatinine on admission 1.27, - baseline creatinine usually 0.6-0.7 --Creatinine normalized with hydration -renally adjust medications, avoid nephrotoxic agents / dehydration  / hypotension  Status is: Inpatient   Disposition: The patient is from: Home              Anticipated d/c is to: Home              Anticipated d/c date is: 1 day              Patient currently is not medically stable to d/c. Barriers: Not Clinically Stable-   Code Status :  -  Code Status: DNR   Family Communication:     (patient is alert, awake and coherent)  -Patient's granddaughter Marchelle Folks and daughter-in-law [name] at bedside, questions answered  DVT Prophylaxis  :   - SCDs    apixaban (ELIQUIS) tablet 5 mg   Lab Results  Component Value Date   PLT 303 07/01/2022    Inpatient Medications  Scheduled Meds:  apixaban  5 mg Oral BID   citalopram  20 mg Oral Daily   diltiazem  240 mg Oral Daily   potassium chloride  40 mEq Oral BID   Continuous Infusions:  sodium chloride 10 mL/hr at 07/01/22 1436   cefTRIAXone (ROCEPHIN)  IV 2 g (07/01/22 1439)  PRN Meds:.sodium chloride, acetaminophen, albuterol, ALPRAZolam, ondansetron (ZOFRAN) IV, polyethylene glycol   Anti-infectives (From admission, onward)    Start     Dose/Rate Route Frequency Ordered Stop   06/29/22 1400  cefTRIAXone (ROCEPHIN) 2 g in sodium chloride 0.9 % 100 mL IVPB        2 g 200 mL/hr over 30 Minutes Intravenous Every 24 hours 06/28/22 1638 04/29/26 1359   06/28/22 1430  cefTRIAXone (ROCEPHIN) 2 g in sodium chloride 0.9 % 100 mL IVPB        2 g 200 mL/hr over 30 Minutes Intravenous  Once 06/28/22 1419 06/28/22 1509        Subjective: Gali Alles today has  No chest pain,  -  -No fevers -No dysuria - -Today for some reason oxygen came off O2 sats dropped to 68 to 70% -Placed on O2 O2 sats back up to 90 to 92% on 3 to 4 L of oxygen at this time -Ambulated with staff--some dyspnea on exertion  -Patient's granddaughter Marchelle Folks and daughter-in-law [name] at bedside, questions answered  Objective: Vitals:   06/30/22 2053 07/01/22 0345 07/01/22 0500 07/01/22 1441  BP: (!) 149/94 (!) 141/88  109/61  Pulse: 93 91  78  Resp: 20 20  20   Temp: 98.7 F (37.1 C) 98.7 F (37.1 C)  98.9 F (37.2 C)  TempSrc:    Oral  SpO2: 95% 92%  93%  Weight:   70.8 kg   Height:        Intake/Output Summary (Last 24 hours) at 07/01/2022 1755 Last data filed at 07/01/2022 1445 Gross per 24 hour  Intake 660 ml  Output 2300 ml  Net -1640 ml   Filed Weights   06/29/22 0555 06/30/22 0500 07/01/22 0500  Weight: 70.8 kg 71.2 kg 70.8 kg   Physical Exam  Gen:- Awake Alert, no acute distress HEENT:- [place].AT, No sclera icterus Nose- [place] 3 L/min Neck-Supple Neck,No JVD,.  Lungs-  CTAB , fair symmetrical air movement CV- S1, S2 normal, irregularly irregular Abd-  +ve B.Sounds, Abd Soft, No tenderness, no CVA area tenderness Extremity/Skin:- No  edema, pedal pulses present  Psych-affect is appropriate, oriented x3 Neuro-generalized weakness, no new focal deficits, no tremors  Data Reviewed: I have personally reviewed following labs and imaging studies  CBC: Recent Labs  Lab 06/28/22 1201 06/29/22 0419 06/30/22 0415 07/01/22 0427  WBC 22.0* 17.8* 13.7* 14.8*  NEUTROABS 15.7*  --   --   --   HGB 12.0 11.0* 9.7* 10.5*  HCT 37.1 35.5* 30.4* 32.8*  MCV 87.1 89.6 88.4 88.2  PLT 242 248 251 303   Basic Metabolic Panel: Recent Labs  Lab 06/28/22 1201 06/29/22 0419 06/30/22 0415  NA 131* 135 135  K 2.9* 3.3* 4.3  CL 90* 96* 100  CO2 29 30 28   GLUCOSE 114* 114* 103*  BUN 28* 22 12  CREATININE 1.27* 1.01*  0.93  CALCIUM 7.9* 7.3* 7.2*  MG  --  2.0  --    GFR: Estimated Creatinine Clearance: 41.6 mL/min (by C-G formula based on SCr of 0.93 mg/dL). Liver Function Tests: Recent Labs  Lab 06/28/22 1201  AST 25  ALT 32  ALKPHOS 72  BILITOT 0.9  PROT 7.2  ALBUMIN 3.4*   Recent Results (from the past 240 hour(s))  Blood culture (routine x 2)     Status: Abnormal   Collection Time: 06/28/22 12:13 PM   Specimen: Left Antecubital; Blood  Result Value Ref Range Status   Specimen  Description LEFT ANTECUBITAL BLOOD  Final   Special Requests   Final    Blood Culture results may not be optimal due to an excessive volume of blood received in culture bottles BOTTLES DRAWN AEROBIC AND ANAEROBIC   Culture  Setup Time   Final    GRAM NEGATIVE RODS Gram Stain Report Called to,Read Back By and Verified With: K.CHEEK @ 0803 BY STEPHTR 06/29/22 IN BOTH AEROBIC AND ANAEROBIC BOTTLES CRITICAL RESULT CALLED TO, READ BACK BY AND VERIFIED WITH: RN Selena Batten CHEEK [id number] BY Berline Chough, MT Performed at [facility], [address], [place], [place] [address]    Culture ESCHERICHIA COLI (A)  Final   Report Status 07/01/2022 FINAL  Final   Organism ID, Bacteria ESCHERICHIA COLI  Final      Susceptibility   Escherichia coli - MIC*    AMPICILLIN <=2 SENSITIVE Sensitive     CEFEPIME <=0.12 SENSITIVE Sensitive     CEFTAZIDIME <=1 SENSITIVE Sensitive     CEFTRIAXONE <=0.25 SENSITIVE Sensitive     CIPROFLOXACIN <=0.25 SENSITIVE Sensitive     GENTAMICIN <=1 SENSITIVE Sensitive     IMIPENEM <=0.25 SENSITIVE Sensitive     TRIMETH/SULFA <=20 SENSITIVE Sensitive     AMPICILLIN/SULBACTAM <=2 SENSITIVE Sensitive     PIP/TAZO <=4 SENSITIVE Sensitive     * ESCHERICHIA COLI  Blood Culture ID Panel (Reflexed)     Status: Abnormal   Collection Time: 06/28/22 12:13 PM  Result Value Ref Range Status   Enterococcus faecalis NOT DETECTED NOT DETECTED Final   Enterococcus Faecium NOT DETECTED NOT DETECTED Final    Listeria monocytogenes NOT DETECTED NOT DETECTED Final   Staphylococcus species NOT DETECTED NOT DETECTED Final   Staphylococcus aureus (BCID) NOT DETECTED NOT DETECTED Final   Staphylococcus epidermidis NOT DETECTED NOT DETECTED Final   Staphylococcus lugdunensis NOT DETECTED NOT DETECTED Final   Streptococcus species NOT DETECTED NOT DETECTED Final   Streptococcus agalactiae NOT DETECTED NOT DETECTED Final   Streptococcus pneumoniae NOT DETECTED NOT DETECTED Final   Streptococcus pyogenes NOT DETECTED NOT DETECTED Final   A.calcoaceticus-baumannii NOT DETECTED NOT DETECTED Final   Bacteroides fragilis NOT DETECTED NOT DETECTED Final   Enterobacterales DETECTED (A) NOT DETECTED Final    Comment: Enterobacterales represent a large order of gram negative bacteria, not a single organism. CRITICAL RESULT CALLED TO, READ BACK BY AND VERIFIED WITH: RN KIM CHEEK [id number] BY J RAZZAK,MT    Enterobacter cloacae complex NOT DETECTED NOT DETECTED Final   Escherichia coli DETECTED (A) NOT DETECTED Final    Comment: CRITICAL RESULT CALLED TO, READ BACK BY AND VERIFIED WITH: RN KIM CHEEK [id number] 1601 BY J RAZZAK, MT    Klebsiella aerogenes NOT DETECTED NOT DETECTED Final   Klebsiella oxytoca NOT DETECTED NOT DETECTED Final   Klebsiella pneumoniae NOT DETECTED NOT DETECTED Final   Proteus species NOT DETECTED NOT DETECTED Final   Salmonella species NOT DETECTED NOT DETECTED Final   Serratia marcescens NOT DETECTED NOT DETECTED Final   Haemophilus influenzae NOT DETECTED NOT DETECTED Final   Neisseria meningitidis NOT DETECTED NOT DETECTED Final   Pseudomonas aeruginosa NOT DETECTED NOT DETECTED Final   Stenotrophomonas maltophilia NOT DETECTED NOT DETECTED Final   Candida albicans NOT DETECTED NOT DETECTED Final   Candida auris NOT DETECTED NOT DETECTED Final   Candida glabrata NOT DETECTED NOT DETECTED Final   Candida krusei NOT DETECTED NOT DETECTED Final   Candida parapsilosis NOT  DETECTED NOT DETECTED Final  Candida tropicalis NOT DETECTED NOT DETECTED Final   Cryptococcus neoformans/gattii NOT DETECTED NOT DETECTED Final   CTX-M ESBL NOT DETECTED NOT DETECTED Final   Carbapenem resistance IMP NOT DETECTED NOT DETECTED Final   Carbapenem resistance KPC NOT DETECTED NOT DETECTED Final   Carbapenem resistance NDM NOT DETECTED NOT DETECTED Final   Carbapenem resist OXA 48 LIKE NOT DETECTED NOT DETECTED Final   Carbapenem resistance VIM NOT DETECTED NOT DETECTED Final    Comment: Performed at [facility], [address] [place], [place] [address]  Blood culture (routine x 2)     Status: Abnormal   Collection Time: 06/28/22 12:18 PM   Specimen: Right Antecubital; Blood  Result Value Ref Range Status   Specimen Description RIGHT ANTECUBITAL BLOOD  Final   Special Requests   Final    Blood Culture results may not be optimal due to an excessive volume of blood received in culture bottles BOTTLES DRAWN AEROBIC AND ANAEROBIC   Culture  Setup Time   Final    GRAM NEGATIVE RODS Gram Stain Report Called to,Read Back By and Verified With:  K. CHEEK @ 0803 BY STEPTR 06/29/22 IN BOTH AEROBIC AND ANAEROBIC BOTTLES CRITICAL VALUE NOTED.  VALUE IS CONSISTENT WITH PREVIOUSLY REPORTED AND CALLED VALUE.    Culture (A)  Final    ESCHERICHIA COLI SUSCEPTIBILITIES PERFORMED ON PREVIOUS CULTURE WITHIN THE LAST 5 DAYS. Performed at [facility], [address], [place], [place] [address]    Report Status 07/01/2022 FINAL  Final  Urine Culture (for pregnant, neutropenic or urologic patients or patients with an indwelling urinary catheter)     Status: None   Collection Time: 06/29/22  8:22 AM   Specimen: Urine, Clean Catch  Result Value Ref Range Status   Specimen Description   Final    URINE, CLEAN CATCH Performed at [facility], [address], [place], [place] [address]    Special Requests   Final    NONE Performed at [facility], [address] [place],  [place] [address]    Culture   Final    NO GROWTH Performed at [facility], [address], [place], [place] [address]    Report Status 06/30/2022 FINAL  Final    Radiology Studies: No results found.  Scheduled Meds:  apixaban  5 mg Oral BID   citalopram  20 mg Oral Daily   diltiazem  240 mg Oral Daily   potassium chloride  40 mEq Oral BID   Continuous Infusions:  sodium chloride 10 mL/hr at 07/01/22 1436   cefTRIAXone (ROCEPHIN)  IV 2 g (07/01/22 1439)    LOS: 3 days   Shon Hale M.D on 07/01/2022 at 5:55 PM  Go to [email or web address] - for contact info  [facility] - Office  [phone]  If 7PM-7AM, please contact night-coverage [email or web address] 07/01/2022, 5:55 PM

## 2022-07-02 ENCOUNTER — Other Ambulatory Visit: Payer: Self-pay | Admitting: *Deleted

## 2022-07-02 DIAGNOSIS — A4150 Gram-negative sepsis, unspecified: Secondary | ICD-10-CM | POA: Diagnosis not present

## 2022-07-02 DIAGNOSIS — N390 Urinary tract infection, site not specified: Secondary | ICD-10-CM | POA: Diagnosis not present

## 2022-07-02 DIAGNOSIS — A419 Sepsis, unspecified organism: Secondary | ICD-10-CM

## 2022-07-02 LAB — CBC
HCT: 34.2 % — ABNORMAL LOW (ref 36.0–46.0)
Hemoglobin: 10.7 g/dL — ABNORMAL LOW (ref 12.0–15.0)
MCH: 27.7 pg (ref 26.0–34.0)
MCHC: 31.3 g/dL (ref 30.0–36.0)
MCV: 88.6 fL (ref 80.0–100.0)
Platelets: 381 10*3/uL (ref 150–400)
RBC: 3.86 MIL/uL — ABNORMAL LOW (ref 3.87–5.11)
RDW: 16 % — ABNORMAL HIGH (ref 11.5–15.5)
WBC: 13.3 10*3/uL — ABNORMAL HIGH (ref 4.0–10.5)
nRBC: 0 % (ref 0.0–0.2)

## 2022-07-02 LAB — RENAL FUNCTION PANEL
Albumin: 2.7 g/dL — ABNORMAL LOW (ref 3.5–5.0)
Anion gap: 11 (ref 5–15)
BUN: 15 mg/dL (ref 8–23)
CO2: 31 mmol/L (ref 22–32)
Calcium: 8.9 mg/dL (ref 8.9–10.3)
Chloride: 94 mmol/L — ABNORMAL LOW (ref 98–111)
Creatinine, Ser: 0.96 mg/dL (ref 0.44–1.00)
GFR, Estimated: 59 mL/min — ABNORMAL LOW (ref >60)
Glucose, Bld: 102 mg/dL — ABNORMAL HIGH (ref 70–99)
Phosphorus: 4.1 mg/dL (ref 2.5–4.6)
Potassium: 5.2 mmol/L — ABNORMAL HIGH (ref 3.5–5.1)
Sodium: 136 mmol/L (ref 135–145)

## 2022-07-02 MED ORDER — SODIUM CHLORIDE 0.9 % IV SOLN
2.0000 g | Freq: Once | INTRAVENOUS | Status: AC
  Administered 2022-07-02: 2 g via INTRAVENOUS
  Filled 2022-07-02: qty 20

## 2022-07-02 MED ORDER — CEPHALEXIN 500 MG PO CAPS: 500.0000 mg | ORAL_CAPSULE | Freq: Three times a day (TID) | ORAL | 0 refills | Status: AC

## 2022-07-02 MED ORDER — APIXABAN 5 MG PO TABS: ORAL_TABLET | ORAL | 3 refills | Status: AC

## 2022-07-02 MED ORDER — PANTOPRAZOLE SODIUM 40 MG PO TBEC: 40.0000 mg | DELAYED_RELEASE_TABLET | Freq: Every day | ORAL | 0 refills | Status: AC

## 2022-07-02 MED ORDER — DILTIAZEM HCL ER COATED BEADS 240 MG PO CP24: 240.0000 mg | ORAL_CAPSULE | Freq: Two times a day (BID) | ORAL | 3 refills | Status: AC

## 2022-07-02 NOTE — Consult Note (Addendum)
[facility] Customer service manager [facility]) Accountable Care Organization (ACO) [facility] Liaison Note  07/02/2022  GABRYELLA ASLAN 1940-10-15 [id number]  Location: [facility] RN Hospital Liaison screened the patient remotely at [facility].  Insurance: MCR ACO   Marissa Turner is a 82 y.o. female who is a Primary Care Patient of Margo Aye, Kathleene Hazel, MD. The patient was screened for readmission hospitalization with noted medium risk score for unplanned readmission risk with 2 IP in 6 months.  The patient was assessed for potential [facility]) Care Management service needs for post hospital transition for care coordination. Review of patient's electronic medical record reveals patient was admitted for Sepsis. Spoke with pt concerning [facility] services and available benefits. Pt receptive to a post hospital follow up call from a [facility] RN care coordinator. Pt reports her granddaughter is working on obtain a smaller home O2 tank and she has a good support system both in the home and family nearby. [facility] Liaison will make a referral for [facility] RN care coordinator due to admitting diagnosis (receptive).  Plan: [facility] Liaison will continue to follow progress and disposition to asess for post hospital community care coordination/management needs.  Referral request for community care coordination: pending disposition.   [facility] Care Management/Population Health does not replace or interfere with any arrangements made by the Inpatient Transition of Care team.   For questions contact:   Elliot Cousin, RN, BSN [facility] Liaison [place]   [facility]  Population Health Office Hours MTWF  8:00 am-6:00 pm Off on Thursday [phone] mobile [phone] [Office toll free line][facility] Office Hours are M-F 8:30 - 5 pm 24 hour nurse advise line [phone] Concierge  [email or web address]

## 2022-07-02 NOTE — Progress Notes (Signed)
Patient has been alert and stable this shift.  No new acute events overnight.  Patient is anxiously anticipating discharge home very soon.

## 2022-07-02 NOTE — Discharge Summary (Signed)
Marissa Turner, is a 82 y.o. female  DOB 1940/03/16  MRN [id number].  Admission date:  06/28/2022  Admitting Physician  No admitting provider for patient encounter.  Discharge Date:  07/02/2022   Primary MD  Benita Stabile, MD  Recommendations for primary care physician for things to follow:   1)You need oxygen at home at 3 L via nasal cannula continuously while awake and while asleep--- smoking or having open fires around oxygen can cause fire, significant injury and death 2) repeat CBC and BMP blood test within 1 week 3) follow-up with PCP/primary care provider for recheck within a week  Admission Diagnosis  Acute cystitis without hematuria [N30.00] Sepsis secondary to UTI (HCC) [A41.9, N39.0]   Discharge Diagnosis  Acute cystitis without hematuria [N30.00] Sepsis secondary to UTI (HCC) [A41.9, N39.0]    Principal Problem:   Sepsis due to gram-negative/GNR UTI [facility]) Active Problems:   Bacteremia and Sepsis due to Gram-negative bacteria   Atrial fibrillation, chronic (HCC)   Chronic anticoagulation   Hypertension   Hypokalemia   Sepsis secondary to UTI (HCC)   AKI (acute kidney injury) (HCC)      Past Medical History:  Diagnosis Date   Atrial fibrillation, chronic (HCC) 2004   left atrial thrombus in 2004; near syncope in 2008; echocardiogram in 2008-normal EF, mild left atrial enlargement, no valvular abnormalities, lipomatous hypertrophy; no aortic atheroma on TEE in 2004   Cancer (HCC)    LYMPH NODE    and lung cancer   Chronic anticoagulation 2004   warfarin   COPD (chronic obstructive pulmonary disease) (HCC)    denies   Depression    DJD (degenerative joint disease), lumbosacral    Also hip   Dysrhythmia    chronic AFib   Family history of adverse reaction to anesthesia    sister PONV   Fracture of wrist    Left   History of kidney stones    once   Hypotension    Panic attacks     Peptic ulcer disease    s/p Billroth II   PONV (postoperative nausea and vomiting)    Tobacco abuse    40 pack years; quit attempt in 2012    Past Surgical History:  Procedure Laterality Date   ABDOMINAL HYSTERECTOMY     w/o oophorectomy  partial   APPENDECTOMY     CESAREAN SECTION     CHOLECYST[facility]OMY  2004   [facility]   COLONOSCOPY  1980s   patient denies ever having undergone colonoscopy as of 2014   GASTROJEJUNOSTOMY  1984   Billroth II; splenectomy; incidental appendectomy   KNEE ARTHROSCOPY WITH MEDIAL MENISECTOMY Right 02/26/2015   Procedure: KNEE ARTHROSCOPY WITH MEDIAL MENISECTOMY;  Surgeon: Darreld Mclean, MD;  Location: [facility];  Service: Orthopedics;  Laterality: Right;   LYMPH NODE BIOPSY Left 09/15/2013   Procedure: LEFT NECK CERVICAL NODE BIOPSY;  Surgeon: Marlane Hatcher, MD;  Location: [facility];  Service: General;  Laterality: Left;   PORT-A-CATH REMOVAL Right 07/12/2014  Procedure: REMOVAL PORT-A-CATH;  Surgeon: Franky Macho Md, MD;  Location: [facility];  Service: General;  Laterality: Right;   PORTACATH PLACEMENT Right 10/21/2013   Procedure: ATTEMPTED INSERTION PORT-A-CATH;  Surgeon: Marlane Hatcher, MD;  Location: [facility];  Service: General;  Laterality: Right;   SPLENECTOMY, TOTAL     THORACOSCOPY Left 04/15/2018   VIDEO ASSISTED THORACOSCOPY (VATS)/LEFT LOWER LOBECTOMY (Left)   TONSILLECTOMY     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   TOTAL HIP ARTHROPLASTY Left 10/18/2018   Procedure: TOTAL HIP ARTHROPLASTY ANTERIOR APPROACH;  Surgeon: Ollen Gross, MD;  Location: [facility];  Service: Orthopedics;  Laterality: Left;    TOTAL KNEE ARTHROPLASTY Right 07/25/2019   Procedure: TOTAL KNEE ARTHROPLASTY;  Surgeon: Ollen Gross, MD;  Location: [facility];  Service: Orthopedics;  Laterality: Right;    VIDEO ASSISTED THORACOSCOPY (VATS)/ LOBECTOMY Left 04/15/2018   Procedure: VIDEO ASSISTED THORACOSCOPY (VATS)/LEFT LOWER LOBECTOMY;  Surgeon: Loreli Slot,  MD;  Location: [facility] OR;  Service: Thoracic;  Laterality: Left;   WEDGE RESECTION Left 04/15/2018   Procedure: LEFT UPPER WEDGE RESECTION;  Surgeon: Loreli Slot, MD;  Location: [facility] OR;  Service: Thoracic;  Laterality: Left;     HPI  from the history and physical done on the day of admission:   HPI: Marissa Turner is a 82 y.o. female with medical history significant of atrial fibrillation on chronic anticoagulation, COPD, hypertension.  Patient brought to the hospital for fatigue and dysuria that started a couple days ago.  Her symptoms have been worsening.  In addition, she has been mildly confused, which was confirmed by the patient's daughter-in-law who participated in the interview.  Her daughter-in-law tried to get her to come sooner, but the patient finally agreed to come today.  Initially, the patient did not have any fevers or chills.  Since being in the emergency department, she has developed a fever.  In addition, on her first arrival she was hypotensive with a systolic blood pressure in the 80s.  This responded to fluids with improvement.   Review of Systems: As mentioned in the history of present illness. All other systems reviewed and are negative   Hospital Course:     Brief Narrative:   82 y.o. female with medical history significant of atrial fibrillation on chronic anticoagulation, COPD, hypertension and chronic hypoxic respiratory failure supposed to be on 2 L of oxygen via nasal cannula at home admitted on 06/28/2022 with gram-negative rod sepsis secondary to urinary source after presenting with dysuria of at least 2 days duration     -Assessment and Plan: 1)E coli bacteremia and sepsis from presumed urinary source---POA -Fever curve trending down -WBC 22 >>13.7>>14.8>13.3 -Patient had tachycardia, tachypnea and transient hypotension on admission -Blood cultures from 06/28/2022 consistent with E. Coli-- -treated with Rocephin discharged on Keflex -Sepsis pathophysiology  has resolved   2)PAFib--continue   Eliquis for stroke prophylaxis -Patient went into A-fib with RVR advised setting of E. coli bacteremia and sepsis, electrolyte abnormalities and discontinuation of oral Cardizem due to soft BP -Required IV Cardizem x 1 on 06/30/2022 -Tolerated short acting oral Cardizem on 06/30/2022 -Restarted Cardizem CD at 240 mg daily on 07/01/2022 -Doing well on PTA dose of Cardizem, continue same   3) acute on chronic hypoxic respiratory failure -PTA patient used 2 to 3 L of oxygen at home -Hypoxia improved, oxygen requirement is back to baseline   4)Hypokalemia/hyponatremia/hypochloremia--- in the setting of sepsis secondary to UTI -Electrolytes normalized with replacements and  hydration   5)Social/Ethics--patient is a DNR/DNI   6) COPD with chronic hypoxic respiratory failure--she is supposed to be on 2 L of oxygen via nasal cannula continuously patient admits to noncompliance with oxygen at home -Continue oxygen therapy baseline of 2 to 3 L/min -No Acute COPD exacerbation at this time, no steroids indicated   7) anxiety disorder----continue Celexa and Xanax   8)AKI----acute kidney injury-due to sepsis with transient hypotension as above #1 --creatinine on admission 1.27, - baseline creatinine usually 0.6-0.7 --Creatinine normalized with hydration -renally adjust medications, avoid nephrotoxic agents / dehydration  / hypotension    Disposition: The patient is from: Home              Anticipated d/c is to: Home  Discussed with Patient's granddaughter Marchelle Folks and daughter-in-law [name]   Discharge Condition: Stable  Follow UP   Follow-up Information     Benita Stabile, MD. Schedule an appointment as soon as possible for a visit.   Specialty: Internal Medicine Contact information: [address] [place] [address] [phone]                 Diet and Activity recommendation:  As advised  Discharge Instructions    Discharge  Instructions     Call MD for:  difficulty breathing, headache or visual disturbances   Complete by: As directed    Call MD for:  persistant dizziness or light-headedness   Complete by: As directed    Call MD for:  persistant nausea and vomiting   Complete by: As directed    Call MD for:  severe uncontrolled pain   Complete by: As directed    Call MD for:  temperature >100.4   Complete by: As directed    Diet - low sodium heart healthy   Complete by: As directed    Discharge instructions   Complete by: As directed    1)You need oxygen at home at 3 L via nasal cannula continuously while awake and while asleep--- smoking or having open fires around oxygen can cause fire, significant injury and death 2) repeat CBC and BMP blood test within 1 week 3) follow-up with PCP/primary care provider for recheck within a week   Increase activity slowly   Complete by: As directed         Discharge Medications     Allergies as of 07/02/2022       Reactions   Estrogens Swelling   edema   Codeine Swelling, Rash   Penicillins Other (See Comments)   Break out in welts. Did it involve swelling of the face/tongue/throat, SOB, or low BP? No Did it involve sudden or severe rash/hives, skin peeling, or any reaction on the inside of your mouth or nose? No Did you need to seek medical attention at a hospital or doctor's office? No When did it last happen?  Childhood allergy      If all above answers are "NO", may proceed with cephalosporin use. Tolerated Cephalosporin Date: 07/26/19.   Diphenhydramine Hcl Other (See Comments)   hallucinations         Medication List     STOP taking these medications    Tessalon Perles 100 MG capsule Generic drug: benzonatate       TAKE these medications    acetaminophen 650 MG CR tablet Commonly known as: TYLENOL Take 1,300 mg by mouth every evening.   albuterol 108 (90 Base) MCG/ACT inhaler Commonly known as: VENTOLIN HFA Inhale 1-2 puffs into  the lungs every 4 (four) hours as needed for wheezing or shortness of breath.   ALPRAZolam 1 MG tablet Commonly known as: XANAX Take 1 tablet (1 mg total) by mouth 3 (three) times daily as needed for anxiety. What changed: when to take this   apixaban 5 MG Tabs tablet Commonly known as: Eliquis TAKE ONE TABLET (5MG  TOTAL) BY MOUTH TWOTIMES DAILY What changed:  how much to take how to take this when to take this additional instructions   cephALEXin 500 MG capsule Commonly known as: Keflex Take 1 capsule (500 mg total) by mouth 3 (three) times daily for 5 days.   citalopram 20 MG tablet Commonly known as: CELEXA Take 20 mg by mouth daily.   diltiazem 240 MG 24 hr capsule Commonly known as: CARDIZEM CD Take 1 capsule (240 mg total) by mouth 2 (two) times daily.   multivitamin capsule Take 1 capsule by mouth daily.   pantoprazole 40 MG tablet Commonly known as: PROTONIX Take 1 tablet (40 mg total) by mouth daily.       Major procedures and Radiology Reports - PLEASE review detailed and final reports for all details, in brief -   DG Chest Portable 1 View  Result Date: 06/28/2022 CLINICAL DATA:  Dyspnea, fatigue EXAM: PORTABLE CHEST 1 VIEW COMPARISON:  05/05/2022 FINDINGS: Transverse diameter of heart is increased. There are no signs of areolar pulmonary edema. Small left pleural effusion is seen. There are linear densities in left lower lung field. Right lateral CP angle is clear. There is no pneumothorax. IMPRESSION: Cardiomegaly. Linear densities in left lower lung field may suggest scarring or subsegmental atelectasis. Small left pleural effusion. Electronically Signed   By: Ernie Avena M.D.   On: 06/28/2022 13:51    Micro Results   Recent Results (from the past 240 hour(s))  Blood culture (routine x 2)     Status: Abnormal   Collection Time: 06/28/22 12:13 PM   Specimen: Left Antecubital; Blood  Result Value Ref Range Status   Specimen Description LEFT  ANTECUBITAL BLOOD  Final   Special Requests   Final    Blood Culture results may not be optimal due to an excessive volume of blood received in culture bottles BOTTLES DRAWN AEROBIC AND ANAEROBIC   Culture  Setup Time   Final    GRAM NEGATIVE RODS Gram Stain Report Called to,Read Back By and Verified With: K.CHEEK @ 0803 BY STEPHTR 06/29/22 IN BOTH AEROBIC AND ANAEROBIC BOTTLES CRITICAL RESULT CALLED TO, READ BACK BY AND VERIFIED WITH: RN Selena Batten CHEEK [id number] BY Berline Chough, MT Performed at [facility], [address], [place], [place] [address]    Culture ESCHERICHIA COLI (A)  Final   Report Status 07/01/2022 FINAL  Final   Organism ID, Bacteria ESCHERICHIA COLI  Final      Susceptibility   Escherichia coli - MIC*    AMPICILLIN <=2 SENSITIVE Sensitive     CEFEPIME <=0.12 SENSITIVE Sensitive     CEFTAZIDIME <=1 SENSITIVE Sensitive     CEFTRIAXONE <=0.25 SENSITIVE Sensitive     CIPROFLOXACIN <=0.25 SENSITIVE Sensitive     GENTAMICIN <=1 SENSITIVE Sensitive     IMIPENEM <=0.25 SENSITIVE Sensitive     TRIMETH/SULFA <=20 SENSITIVE Sensitive     AMPICILLIN/SULBACTAM <=2 SENSITIVE Sensitive     PIP/TAZO <=4 SENSITIVE Sensitive     * ESCHERICHIA COLI  Blood Culture ID Panel (Reflexed)     Status: Abnormal   Collection Time: 06/28/22 12:13 PM  Result Value Ref Range Status   Enterococcus faecalis NOT DETECTED NOT DETECTED Final   Enterococcus Faecium NOT DETECTED NOT DETECTED Final   Listeria monocytogenes NOT DETECTED NOT DETECTED Final   Staphylococcus species NOT DETECTED NOT DETECTED Final   Staphylococcus aureus (BCID) NOT DETECTED NOT DETECTED Final   Staphylococcus epidermidis NOT DETECTED NOT DETECTED Final   Staphylococcus lugdunensis NOT DETECTED NOT DETECTED Final   Streptococcus species NOT DETECTED NOT DETECTED Final   Streptococcus agalactiae NOT DETECTED NOT DETECTED Final   Streptococcus pneumoniae NOT DETECTED NOT DETECTED Final   Streptococcus pyogenes NOT  DETECTED NOT DETECTED Final   A.calcoaceticus-baumannii NOT DETECTED NOT DETECTED Final   Bacteroides fragilis NOT DETECTED NOT DETECTED Final   Enterobacterales DETECTED (A) NOT DETECTED Final    Comment: Enterobacterales represent a large order of gram negative bacteria, not a single organism. CRITICAL RESULT CALLED TO, READ BACK BY AND VERIFIED WITH: RN KIM CHEEK [id number] BY J RAZZAK,MT    Enterobacter cloacae complex NOT DETECTED NOT DETECTED Final   Escherichia coli DETECTED (A) NOT DETECTED Final    Comment: CRITICAL RESULT CALLED TO, READ BACK BY AND VERIFIED WITH: RN KIM CHEEK [id number] BY J RAZZAK, MT    Klebsiella aerogenes NOT DETECTED NOT DETECTED Final   Klebsiella oxytoca NOT DETECTED NOT DETECTED Final   Klebsiella pneumoniae NOT DETECTED NOT DETECTED Final   Proteus species NOT DETECTED NOT DETECTED Final   Salmonella species NOT DETECTED NOT DETECTED Final   Serratia marcescens NOT DETECTED NOT DETECTED Final   Haemophilus influenzae NOT DETECTED NOT DETECTED Final   Neisseria meningitidis NOT DETECTED NOT DETECTED Final   Pseudomonas aeruginosa NOT DETECTED NOT DETECTED Final   Stenotrophomonas maltophilia NOT DETECTED NOT DETECTED Final   Candida albicans NOT DETECTED NOT DETECTED Final   Candida auris NOT DETECTED NOT DETECTED Final   Candida glabrata NOT DETECTED NOT DETECTED Final   Candida krusei NOT DETECTED NOT DETECTED Final   Candida parapsilosis NOT DETECTED NOT DETECTED Final   Candida tropicalis NOT DETECTED NOT DETECTED Final   Cryptococcus neoformans/gattii NOT DETECTED NOT DETECTED Final   CTX-M ESBL NOT DETECTED NOT DETECTED Final   Carbapenem resistance IMP NOT DETECTED NOT DETECTED Final   Carbapenem resistance KPC NOT DETECTED NOT DETECTED Final   Carbapenem resistance NDM NOT DETECTED NOT DETECTED Final   Carbapenem resist OXA 48 LIKE NOT DETECTED NOT DETECTED Final   Carbapenem resistance VIM NOT DETECTED NOT DETECTED Final     Comment: Performed at [facility], [address], [place], [place] [address]  Blood culture (routine x 2)     Status: Abnormal   Collection Time: 06/28/22 12:18 PM   Specimen: Right Antecubital; Blood  Result Value Ref Range Status   Specimen Description RIGHT ANTECUBITAL BLOOD  Final   Special Requests   Final    Blood Culture results may not be optimal due to an excessive volume of blood received in culture bottles BOTTLES DRAWN AEROBIC AND ANAEROBIC   Culture  Setup Time   Final    GRAM NEGATIVE RODS Gram Stain Report Called to,Read Back By and Verified With:  K. CHEEK @ 0803 BY STEPTR 06/29/22 IN BOTH AEROBIC AND ANAEROBIC BOTTLES CRITICAL VALUE NOTED.  VALUE IS CONSISTENT WITH PREVIOUSLY REPORTED AND CALLED VALUE.    Culture (A)  Final    ESCHERICHIA COLI SUSCEPTIBILITIES PERFORMED ON PREVIOUS CULTURE WITHIN THE LAST 5 DAYS. Performed at [facility], [address] [place],  [place] [address]    Report Status 07/01/2022 FINAL  Final  Urine Culture (for pregnant, neutropenic or urologic patients or patients with an indwelling urinary catheter)     Status: None   Collection Time: 06/29/22  8:22 AM   Specimen: Urine, Clean Catch  Result Value Ref Range Status   Specimen Description   Final    URINE, CLEAN CATCH Performed at [facility], [address], [place], [place] [address]    Special Requests   Final    NONE Performed at [facility], [address] [place], [place] [address]    Culture   Final    NO GROWTH Performed at [facility], [address], [place], [place] [address]    Report Status 06/30/2022 FINAL  Final    Today   Subjective    Marissa Turner today has no new concerns - Dyspnea has improved significantly -No chest pains no dizziness no palpitations   Patient has been seen and examined prior to discharge   Objective   Blood pressure 119/82, pulse (!) 102, temperature (!) 96.9 F (36.1 C), resp. rate 18, height 5' (1.524 m),  weight 68 kg, SpO2 97 %.   Intake/Output Summary (Last 24 hours) at 07/02/2022 1124 Last data filed at 07/02/2022 2956 Gross per 24 hour  Intake 460 ml  Output 1600 ml  Net -1140 ml    Exam Gen:- Awake Alert, no acute distress  HEENT:- [place].AT, No sclera icterus Nose- [place] 3L/min Neck-Supple Neck,No JVD,.  Lungs-  CTAB , good air movement bilaterally CV- S1, S2 normal, irregular Abd-  +ve B.Sounds, Abd Soft, No tenderness,    Extremity/Skin:- No  edema,   good pulses Psych-affect is appropriate, oriented x3 Neuro-no new focal deficits, no tremors    Data Review   CBC w Diff:  Lab Results  Component Value Date   WBC 13.3 (H) 07/02/2022   HGB 10.7 (L) 07/02/2022   HCT 34.2 (L) 07/02/2022   PLT 381 07/02/2022   LYMPHOPCT 14 06/28/2022   MONOPCT 11 06/28/2022   EOSPCT 1 06/28/2022   BASOPCT 0 06/28/2022    CMP:  Lab Results  Component Value Date   NA 136 07/02/2022   K 5.2 (H) 07/02/2022   CL 94 (L) 07/02/2022   CO2 31 07/02/2022   BUN 15 07/02/2022   CREATININE 0.96 07/02/2022   CREATININE 0.65 06/30/2013   PROT 7.2 06/28/2022   ALBUMIN 2.7 (L) 07/02/2022   BILITOT 0.9 06/28/2022   ALKPHOS 72 06/28/2022   AST 25 06/28/2022   ALT 32 06/28/2022  .  Total Discharge time is about 33 minutes  Shon Hale M.D on 07/02/2022 at 11:24 AM  Go to [email or web address] -  for contact info  [facility] - Office  [phone]

## 2022-07-02 NOTE — Discharge Instructions (Signed)
1)You need oxygen at home at 3 L via nasal cannula continuously while awake and while asleep--- smoking or having open fires around oxygen can cause fire, significant injury and death 2) repeat CBC and BMP blood test within 1 week 3) follow-up with PCP/primary care provider for recheck within a week

## 2022-07-03 ENCOUNTER — Other Ambulatory Visit: Payer: Self-pay | Admitting: Family Medicine

## 2022-07-03 DIAGNOSIS — M160 Bilateral primary osteoarthritis of hip: Secondary | ICD-10-CM

## 2022-07-04 DIAGNOSIS — Z9981 Dependence on supplemental oxygen: Secondary | ICD-10-CM | POA: Diagnosis not present

## 2022-07-04 DIAGNOSIS — N200 Calculus of kidney: Secondary | ICD-10-CM | POA: Diagnosis not present

## 2022-07-04 DIAGNOSIS — J441 Chronic obstructive pulmonary disease with (acute) exacerbation: Secondary | ICD-10-CM | POA: Diagnosis not present

## 2022-07-04 DIAGNOSIS — Z9181 History of falling: Secondary | ICD-10-CM | POA: Diagnosis not present

## 2022-07-04 DIAGNOSIS — Z85118 Personal history of other malignant neoplasm of bronchus and lung: Secondary | ICD-10-CM | POA: Diagnosis not present

## 2022-07-04 DIAGNOSIS — J449 Chronic obstructive pulmonary disease, unspecified: Secondary | ICD-10-CM | POA: Diagnosis not present

## 2022-07-04 DIAGNOSIS — F410 Panic disorder [episodic paroxysmal anxiety] without agoraphobia: Secondary | ICD-10-CM | POA: Diagnosis not present

## 2022-07-04 DIAGNOSIS — A4150 Gram-negative sepsis, unspecified: Secondary | ICD-10-CM | POA: Diagnosis not present

## 2022-07-04 DIAGNOSIS — Z902 Acquired absence of lung [part of]: Secondary | ICD-10-CM | POA: Diagnosis not present

## 2022-07-04 DIAGNOSIS — M47817 Spondylosis without myelopathy or radiculopathy, lumbosacral region: Secondary | ICD-10-CM | POA: Diagnosis not present

## 2022-07-04 DIAGNOSIS — Z7901 Long term (current) use of anticoagulants: Secondary | ICD-10-CM | POA: Diagnosis not present

## 2022-07-04 DIAGNOSIS — E876 Hypokalemia: Secondary | ICD-10-CM | POA: Diagnosis not present

## 2022-07-04 DIAGNOSIS — A4151 Sepsis due to Escherichia coli [E. coli]: Secondary | ICD-10-CM | POA: Diagnosis not present

## 2022-07-04 DIAGNOSIS — J9621 Acute and chronic respiratory failure with hypoxia: Secondary | ICD-10-CM | POA: Diagnosis not present

## 2022-07-04 DIAGNOSIS — N179 Acute kidney failure, unspecified: Secondary | ICD-10-CM | POA: Diagnosis not present

## 2022-07-04 DIAGNOSIS — K279 Peptic ulcer, site unspecified, unspecified as acute or chronic, without hemorrhage or perforation: Secondary | ICD-10-CM | POA: Diagnosis not present

## 2022-07-04 DIAGNOSIS — N3000 Acute cystitis without hematuria: Secondary | ICD-10-CM | POA: Diagnosis not present

## 2022-07-04 DIAGNOSIS — I4821 Permanent atrial fibrillation: Secondary | ICD-10-CM | POA: Diagnosis not present

## 2022-07-04 DIAGNOSIS — E119 Type 2 diabetes mellitus without complications: Secondary | ICD-10-CM | POA: Diagnosis not present

## 2022-07-04 DIAGNOSIS — D509 Iron deficiency anemia, unspecified: Secondary | ICD-10-CM | POA: Diagnosis not present

## 2022-07-04 DIAGNOSIS — I959 Hypotension, unspecified: Secondary | ICD-10-CM | POA: Diagnosis not present

## 2022-07-04 DIAGNOSIS — F32A Depression, unspecified: Secondary | ICD-10-CM | POA: Diagnosis not present

## 2022-07-07 ENCOUNTER — Telehealth: Payer: Self-pay | Admitting: *Deleted

## 2022-07-07 DIAGNOSIS — I4821 Permanent atrial fibrillation: Secondary | ICD-10-CM | POA: Diagnosis not present

## 2022-07-07 DIAGNOSIS — N3000 Acute cystitis without hematuria: Secondary | ICD-10-CM | POA: Diagnosis not present

## 2022-07-07 DIAGNOSIS — A4150 Gram-negative sepsis, unspecified: Secondary | ICD-10-CM | POA: Diagnosis not present

## 2022-07-07 DIAGNOSIS — A4151 Sepsis due to Escherichia coli [E. coli]: Secondary | ICD-10-CM | POA: Diagnosis not present

## 2022-07-07 DIAGNOSIS — J449 Chronic obstructive pulmonary disease, unspecified: Secondary | ICD-10-CM | POA: Diagnosis not present

## 2022-07-07 DIAGNOSIS — E119 Type 2 diabetes mellitus without complications: Secondary | ICD-10-CM | POA: Diagnosis not present

## 2022-07-07 NOTE — Progress Notes (Unsigned)
  Care Coordination  Outreach Note  07/07/2022 Name: SHACORA ZYNDA MRN: [id number] DOB: May 19, 1940   Care Coordination Outreach Attempts: An unsuccessful telephone outreach was attempted today to offer the patient information about available care coordination services.  Follow Up Plan:  Additional outreach attempts will be made to offer the patient care coordination information and services.   Encounter Outcome:  No Answer  Christie Nottingham  Care Coordination Care Guide  Direct Dial: [phone]

## 2022-07-09 DIAGNOSIS — I4821 Permanent atrial fibrillation: Secondary | ICD-10-CM | POA: Diagnosis not present

## 2022-07-09 DIAGNOSIS — J449 Chronic obstructive pulmonary disease, unspecified: Secondary | ICD-10-CM | POA: Diagnosis not present

## 2022-07-09 DIAGNOSIS — A4151 Sepsis due to Escherichia coli [E. coli]: Secondary | ICD-10-CM | POA: Diagnosis not present

## 2022-07-09 DIAGNOSIS — E119 Type 2 diabetes mellitus without complications: Secondary | ICD-10-CM | POA: Diagnosis not present

## 2022-07-09 DIAGNOSIS — A4150 Gram-negative sepsis, unspecified: Secondary | ICD-10-CM | POA: Diagnosis not present

## 2022-07-09 DIAGNOSIS — N3000 Acute cystitis without hematuria: Secondary | ICD-10-CM | POA: Diagnosis not present

## 2022-07-09 NOTE — Progress Notes (Signed)
  Care Coordination   Note   07/09/2022 Name: KAJUANA SHAREEF MRN: [id number] DOB: 31-Mar-1940  Yolonda Kida Mccarey is a 82 y.o. year old female who sees Margo Aye, Kathleene Hazel, MD for primary care. I reached out to Trecia Rogers by phone today to offer care coordination services.  Ms. Heiden was given information about Care Coordination services today including:   The Care Coordination services include support from the care team which includes your Nurse Coordinator, Clinical Social Worker, or Pharmacist.  The Care Coordination team is here to help remove barriers to the health concerns and goals most important to you. Care Coordination services are voluntary, and the patient may decline or stop services at any time by request to their care team member.   Care Coordination Consent Status: Patient agreed to services and verbal consent obtained.   Follow up plan:  Telephone appointment with care coordination team member scheduled for:  6/18  Encounter Outcome:  Pt. Scheduled  [facility] Coordination Care Guide  Direct Dial: [phone]

## 2022-07-11 DIAGNOSIS — I10 Essential (primary) hypertension: Secondary | ICD-10-CM | POA: Diagnosis not present

## 2022-07-11 DIAGNOSIS — E559 Vitamin D deficiency, unspecified: Secondary | ICD-10-CM | POA: Diagnosis not present

## 2022-07-11 DIAGNOSIS — E870 Hyperosmolality and hypernatremia: Secondary | ICD-10-CM | POA: Insufficient documentation

## 2022-07-11 DIAGNOSIS — N179 Acute kidney failure, unspecified: Secondary | ICD-10-CM | POA: Diagnosis not present

## 2022-07-11 DIAGNOSIS — J9621 Acute and chronic respiratory failure with hypoxia: Secondary | ICD-10-CM | POA: Diagnosis not present

## 2022-07-11 DIAGNOSIS — J449 Chronic obstructive pulmonary disease, unspecified: Secondary | ICD-10-CM | POA: Diagnosis not present

## 2022-07-11 DIAGNOSIS — D649 Anemia, unspecified: Secondary | ICD-10-CM | POA: Diagnosis not present

## 2022-07-11 DIAGNOSIS — J9611 Chronic respiratory failure with hypoxia: Secondary | ICD-10-CM | POA: Insufficient documentation

## 2022-07-11 DIAGNOSIS — F411 Generalized anxiety disorder: Secondary | ICD-10-CM | POA: Diagnosis not present

## 2022-07-11 DIAGNOSIS — A4151 Sepsis due to Escherichia coli [E. coli]: Secondary | ICD-10-CM | POA: Diagnosis not present

## 2022-07-11 DIAGNOSIS — E876 Hypokalemia: Secondary | ICD-10-CM | POA: Diagnosis not present

## 2022-07-11 DIAGNOSIS — I482 Chronic atrial fibrillation, unspecified: Secondary | ICD-10-CM | POA: Diagnosis not present

## 2022-07-11 DIAGNOSIS — E782 Mixed hyperlipidemia: Secondary | ICD-10-CM | POA: Diagnosis not present

## 2022-07-11 DIAGNOSIS — I700 Atherosclerosis of aorta: Secondary | ICD-10-CM | POA: Insufficient documentation

## 2022-07-11 DIAGNOSIS — R7301 Impaired fasting glucose: Secondary | ICD-10-CM | POA: Diagnosis not present

## 2022-07-11 DIAGNOSIS — D72829 Elevated white blood cell count, unspecified: Secondary | ICD-10-CM | POA: Diagnosis not present

## 2022-07-11 DIAGNOSIS — R9389 Abnormal findings on diagnostic imaging of other specified body structures: Secondary | ICD-10-CM | POA: Insufficient documentation

## 2022-07-15 ENCOUNTER — Ambulatory Visit: Payer: Self-pay | Admitting: *Deleted

## 2022-07-15 NOTE — Patient Instructions (Addendum)
 Visit Information  Thank you for taking time to visit with me today. Please don't hesitate to contact me if I can be of assistance to you.   Following are the goals we discussed today:   Goals Addressed             This Visit's Progress    Patient Stated       Interventions Today    Flowsheet Row Most Recent Value  Chronic Disease   Chronic disease during today's visit Chronic Obstructive Pulmonary Disease (COPD)  General Interventions   General Interventions Discussed/Reviewed General Interventions Discussed  Exercise Interventions   Exercise Discussed/Reviewed Exercise Discussed, Physical Activity  Physical Activity Discussed/Reviewed Physical Activity Discussed  Education Interventions   Education Provided Provided Education  Provided Verbal Education On Medication, Community Resources  Mental Health Interventions   Mental Health Discussed/Reviewed Mental Health Discussed, Coping Strategies  Nutrition Interventions   Nutrition Discussed/Reviewed Nutrition Discussed, Decreasing sugar intake  Pharmacy Interventions   Pharmacy Dicussed/Reviewed Pharmacy Topics Discussed, Medications and their functions, Affording Medications  [eliquis]  Safety Interventions   Safety Discussed/Reviewed Safety Discussed  Advanced Directive Interventions   Advanced Directives Discussed/Reviewed Advanced Directives Discussed              Our next appointment is by telephone on 08/19/22 at 3:15 pm  Please call the care guide team at [phone] if you need to cancel or reschedule your appointment.   If you are experiencing a Mental Health or Behavioral Health Crisis or need someone to talk to, please call the Suicide and Crisis Lifeline: 988 call the [place] National Suicide Prevention Lifeline: 626-396-2176 or TTY: 9721695278 TTY (574) 621-7615) to talk to a trained counselor call 1-800-273-TALK (toll free, 24 hour hotline) call the [facility]: (786) 223-5269 call 911    Patient verbalizes understanding of instructions and care plan provided today and agrees to view in MyChart. Active MyChart status and patient understanding of how to access instructions and care plan via MyChart confirmed with patient.     The patient has been provided with contact information for the care management team and has been advised to call with any health related questions or concerns.   Keyleigh Manninen L. Noelle Penner, RN, BSN, CCM [facility] Care Management Community Coordinator Office number [phone]

## 2022-07-15 NOTE — Patient Outreach (Signed)
  Care Coordination   Initial Visit Note   04/23/2023 updated entry for 07/15/22 Name: Marissa Turner MRN: [id number] DOB: 1940/12/20  Marissa Turner is a 82 y.o. year old female who sees Marissa Turner, Marissa Hazel, MD for primary care. I spoke with  Marissa Turner by phone today.  What matters to the patients health and wellness today?  Reports she is doing very well since her hospital discharge on 07/02/22 She reports keeping very active with house work & cooking  COPD She is down to 2 L oxygen and would like to wean to be as needed She reports a trial of being without her oxygen for an hour without desaturation  Denies further breathing issues even with exertion   Medication cost concern with her Eliquis,  donut hole Recent charge of $145 at her pharmacy    Goals Addressed             This Visit's Progress    Patient Stated       Interventions Today    Flowsheet Row Most Recent Value  Chronic Disease   Chronic disease during today's visit Chronic Obstructive Pulmonary Disease (COPD)  General Interventions   General Interventions Discussed/Reviewed General Interventions Discussed  Exercise Interventions   Exercise Discussed/Reviewed Exercise Discussed, Physical Activity  Physical Activity Discussed/Reviewed Physical Activity Discussed  Education Interventions   Education Provided Provided Education  Provided Verbal Education On Medication, Community Resources  Mental Health Interventions   Mental Health Discussed/Reviewed Mental Health Discussed, Coping Strategies  Nutrition Interventions   Nutrition Discussed/Reviewed Nutrition Discussed, Decreasing sugar intake  Pharmacy Interventions   Pharmacy Dicussed/Reviewed Pharmacy Topics Discussed, Medications and their functions, Affording Medications  [eliquis]  Safety Interventions   Safety Discussed/Reviewed Safety Discussed  Advanced Directive Interventions   Advanced Directives Discussed/Reviewed Advanced Directives Discussed               SDOH assessments and interventions completed:  Yes  SDOH Interventions Today    Flowsheet Row Most Recent Value  SDOH Interventions   Food Insecurity Interventions Intervention Not Indicated  Housing Interventions Intervention Not Indicated  Transportation Interventions Intervention Not Indicated  Utilities Interventions Intervention Not Indicated  Financial Strain Interventions Intervention Not Indicated  Physical Activity Interventions Intervention Not Indicated  Social Connections Interventions Intervention Not Indicated        Care Coordination Interventions:  Yes, provided   Follow up plan: Follow up call scheduled for 08/19/22    Encounter Outcome:  Pt. Visit Completed   Marquesa Rath L. Noelle Penner, RN, BSN, CCM [facility] Care Management Community Coordinator Office number [phone]

## 2022-07-17 ENCOUNTER — Other Ambulatory Visit: Payer: Self-pay

## 2022-07-18 DIAGNOSIS — A4150 Gram-negative sepsis, unspecified: Secondary | ICD-10-CM | POA: Diagnosis not present

## 2022-07-18 DIAGNOSIS — J449 Chronic obstructive pulmonary disease, unspecified: Secondary | ICD-10-CM | POA: Diagnosis not present

## 2022-07-18 DIAGNOSIS — A4151 Sepsis due to Escherichia coli [E. coli]: Secondary | ICD-10-CM | POA: Diagnosis not present

## 2022-07-18 DIAGNOSIS — I4821 Permanent atrial fibrillation: Secondary | ICD-10-CM | POA: Diagnosis not present

## 2022-07-18 DIAGNOSIS — E119 Type 2 diabetes mellitus without complications: Secondary | ICD-10-CM | POA: Diagnosis not present

## 2022-07-18 DIAGNOSIS — N3000 Acute cystitis without hematuria: Secondary | ICD-10-CM | POA: Diagnosis not present

## 2022-07-23 DIAGNOSIS — J449 Chronic obstructive pulmonary disease, unspecified: Secondary | ICD-10-CM | POA: Diagnosis not present

## 2022-07-23 DIAGNOSIS — N3000 Acute cystitis without hematuria: Secondary | ICD-10-CM | POA: Diagnosis not present

## 2022-07-23 DIAGNOSIS — I4821 Permanent atrial fibrillation: Secondary | ICD-10-CM | POA: Diagnosis not present

## 2022-07-23 DIAGNOSIS — A4150 Gram-negative sepsis, unspecified: Secondary | ICD-10-CM | POA: Diagnosis not present

## 2022-07-23 DIAGNOSIS — E119 Type 2 diabetes mellitus without complications: Secondary | ICD-10-CM | POA: Diagnosis not present

## 2022-07-23 DIAGNOSIS — A4151 Sepsis due to Escherichia coli [E. coli]: Secondary | ICD-10-CM | POA: Diagnosis not present

## 2022-08-01 ENCOUNTER — Institutional Professional Consult (permissible substitution): Payer: Medicare Other | Admitting: Internal Medicine

## 2022-08-01 NOTE — Progress Notes (Deleted)
   Marissa Turner, female    DOB: December 13, 1940    MRN: [id number]   Brief patient profile:  ***  yo*** *** referred to pulmonary clinic in [place]  08/01/2022 by *** for ***      History of Present Illness  08/01/2022  Pulmonary/ 1st office eval/ Sherene Sires / [place] Office  No chief complaint on file.    Dyspnea:  *** Cough: *** Sleep: *** SABA use: *** 02: *** Lung cancer screen: ***   Outpatient Medications Prior to Visit  Medication Sig Dispense Refill   acetaminophen (TYLENOL) 650 MG CR tablet Take 1,300 mg by mouth every evening.      albuterol (VENTOLIN HFA) 108 (90 Base) MCG/ACT inhaler Inhale 1-2 puffs into the lungs every 4 (four) hours as needed for wheezing or shortness of breath.     ALPRAZolam (XANAX) 1 MG tablet Take 1 tablet (1 mg total) by mouth 3 (three) times daily as needed for anxiety. (Patient taking differently: Take 1 mg by mouth at bedtime.) 30 tablet 0   apixaban (ELIQUIS) 5 MG TABS tablet TAKE ONE TABLET (5MG  TOTAL) BY MOUTH TWOTIMES DAILY 180 tablet 3   citalopram (CELEXA) 20 MG tablet Take 20 mg by mouth daily.     diltiazem (CARDIZEM CD) 240 MG 24 hr capsule Take 1 capsule (240 mg total) by mouth 2 (two) times daily. 60 capsule 3   Multiple Vitamin (MULTIVITAMIN) capsule Take 1 capsule by mouth daily.     pantoprazole (PROTONIX) 40 MG tablet Take 1 tablet (40 mg total) by mouth daily. 90 tablet 0   No facility-administered medications prior to visit.    Past Medical History:  Diagnosis Date   Atrial fibrillation, chronic (HCC) 2004   left atrial thrombus in 2004; near syncope in 2008; echocardiogram in 2008-normal EF, mild left atrial enlargement, no valvular abnormalities, lipomatous hypertrophy; no aortic atheroma on TEE in 2004   Cancer (HCC)    LYMPH NODE    and lung cancer   Chronic anticoagulation 2004   warfarin   COPD (chronic obstructive pulmonary disease) (HCC)    denies   Depression    DJD (degenerative joint disease), lumbosacral     Also hip   Dysrhythmia    chronic AFib   Family history of adverse reaction to anesthesia    sister PONV   Fracture of wrist    Left   History of kidney stones    once   Hypotension    Panic attacks    Peptic ulcer disease    s/p Billroth II   PONV (postoperative nausea and vomiting)    Tobacco abuse    40 pack years; quit attempt in 2012      Objective:     There were no vitals taken for this visit.         Assessment   No problem-specific Assessment & Plan notes found for this encounter.     Sandrea Hughs, MD 08/01/2022

## 2022-08-19 ENCOUNTER — Ambulatory Visit: Payer: Self-pay | Admitting: *Deleted

## 2022-08-19 NOTE — Patient Outreach (Signed)
  Care Coordination   08/19/2022 Name: Marissa Turner MRN: [id number] DOB: [birth year]   Care Coordination Outreach Attempts:  An unsuccessful telephone outreach was attempted for a scheduled appointment today.  Follow Up Plan:  Additional outreach attempts will be made to offer the patient care coordination information and services.   Encounter Outcome:  No Answer   Care Coordination Interventions:  No, not indicated    Mattie Nordell L. Noelle Penner, RN, BSN, CCM [facility] Care Management Community Coordinator Office number [phone]

## 2022-09-02 ENCOUNTER — Telehealth: Payer: Self-pay | Admitting: *Deleted

## 2022-09-02 NOTE — Progress Notes (Signed)
  Care Coordination Note  09/02/2022 Name: Marissa Turner MRN: [id number] DOB: 02/26/1940  [name] is a 82 y.o. year old female who is a primary care patient of Margo Aye, Kathleene Hazel, MD and is actively engaged with the care management team. I reached out to Trecia Rogers by phone today to assist with re-scheduling a follow up visit with the RN Case Manager  Follow up plan: Unsuccessful telephone outreach attempt made.   [facility]  Care Coordination Care Guide  Direct Dial: [phone]

## 2022-09-05 NOTE — Progress Notes (Signed)
  Care Coordination Note  09/05/2022 Name: KESHAUNA VICENCIO MRN: [id number] DOB: [birth year]  Yolonda Kida Brazel is a 82 y.o. year old female who is a primary care patient of Benita Stabile, MD and is actively engaged with the care management team. I reached out to Trecia Rogers by phone today to assist with re-scheduling a follow up visit with the RN Case Manager  Follow up plan: Telephone appointment with care management team member scheduled for: 09/10/22  [facility]  Care Coordination Care Guide  Direct Dial: [phone]

## 2022-09-06 ENCOUNTER — Other Ambulatory Visit: Payer: Self-pay

## 2022-09-10 ENCOUNTER — Ambulatory Visit: Payer: Self-pay | Admitting: *Deleted

## 2022-09-10 NOTE — Patient Outreach (Signed)
  Care Coordination   09/10/2022 Name: Marissa Turner MRN: [id number] DOB: [birth year]   Care Coordination Outreach Attempts:  An unsuccessful telephone outreach was attempted for a scheduled appointment today.  Follow Up Plan:  Additional outreach attempts will be made to offer the patient care coordination information and services.   Encounter Outcome:  No Answer   Care Coordination Interventions:  No, not indicated     L. Noelle Penner, RN, BSN, CCM [facility] Care Management Community Coordinator Office number [phone]

## 2022-10-20 DIAGNOSIS — N179 Acute kidney failure, unspecified: Secondary | ICD-10-CM | POA: Diagnosis not present

## 2022-10-20 DIAGNOSIS — R252 Cramp and spasm: Secondary | ICD-10-CM | POA: Diagnosis not present

## 2022-10-20 DIAGNOSIS — E559 Vitamin D deficiency, unspecified: Secondary | ICD-10-CM | POA: Diagnosis not present

## 2022-10-20 DIAGNOSIS — R7301 Impaired fasting glucose: Secondary | ICD-10-CM | POA: Diagnosis not present

## 2022-10-20 DIAGNOSIS — I10 Essential (primary) hypertension: Secondary | ICD-10-CM | POA: Diagnosis not present

## 2022-10-20 DIAGNOSIS — D649 Anemia, unspecified: Secondary | ICD-10-CM | POA: Diagnosis not present

## 2022-10-22 ENCOUNTER — Other Ambulatory Visit (HOSPITAL_COMMUNITY): Payer: Self-pay | Admitting: Internal Medicine

## 2022-10-22 DIAGNOSIS — J449 Chronic obstructive pulmonary disease, unspecified: Secondary | ICD-10-CM | POA: Diagnosis not present

## 2022-10-22 DIAGNOSIS — R9389 Abnormal findings on diagnostic imaging of other specified body structures: Secondary | ICD-10-CM

## 2022-10-22 DIAGNOSIS — I10 Essential (primary) hypertension: Secondary | ICD-10-CM | POA: Diagnosis not present

## 2022-10-22 DIAGNOSIS — Z0001 Encounter for general adult medical examination with abnormal findings: Secondary | ICD-10-CM | POA: Diagnosis not present

## 2022-10-22 DIAGNOSIS — R32 Unspecified urinary incontinence: Secondary | ICD-10-CM | POA: Diagnosis not present

## 2022-10-22 DIAGNOSIS — J9621 Acute and chronic respiratory failure with hypoxia: Secondary | ICD-10-CM | POA: Diagnosis not present

## 2022-10-22 DIAGNOSIS — N179 Acute kidney failure, unspecified: Secondary | ICD-10-CM | POA: Diagnosis not present

## 2022-10-22 DIAGNOSIS — D649 Anemia, unspecified: Secondary | ICD-10-CM | POA: Diagnosis not present

## 2022-10-22 DIAGNOSIS — D72829 Elevated white blood cell count, unspecified: Secondary | ICD-10-CM | POA: Diagnosis not present

## 2022-10-22 DIAGNOSIS — I482 Chronic atrial fibrillation, unspecified: Secondary | ICD-10-CM | POA: Diagnosis not present

## 2022-10-22 DIAGNOSIS — F411 Generalized anxiety disorder: Secondary | ICD-10-CM | POA: Diagnosis not present

## 2022-10-22 DIAGNOSIS — Z23 Encounter for immunization: Secondary | ICD-10-CM | POA: Diagnosis not present

## 2022-10-31 ENCOUNTER — Encounter (HOSPITAL_COMMUNITY): Payer: Self-pay

## 2022-10-31 ENCOUNTER — Ambulatory Visit (HOSPITAL_COMMUNITY): Payer: Medicare Other | Attending: Internal Medicine

## 2022-10-31 ENCOUNTER — Other Ambulatory Visit: Payer: Self-pay

## 2022-12-16 ENCOUNTER — Other Ambulatory Visit: Payer: Self-pay

## 2023-02-04 DIAGNOSIS — I10 Essential (primary) hypertension: Secondary | ICD-10-CM | POA: Diagnosis not present

## 2023-02-04 DIAGNOSIS — R252 Cramp and spasm: Secondary | ICD-10-CM | POA: Diagnosis not present

## 2023-02-04 DIAGNOSIS — R7301 Impaired fasting glucose: Secondary | ICD-10-CM | POA: Diagnosis not present

## 2023-02-04 DIAGNOSIS — E559 Vitamin D deficiency, unspecified: Secondary | ICD-10-CM | POA: Diagnosis not present

## 2023-02-04 DIAGNOSIS — D649 Anemia, unspecified: Secondary | ICD-10-CM | POA: Diagnosis not present

## 2023-02-05 DIAGNOSIS — J449 Chronic obstructive pulmonary disease, unspecified: Secondary | ICD-10-CM | POA: Diagnosis not present

## 2023-02-05 DIAGNOSIS — J069 Acute upper respiratory infection, unspecified: Secondary | ICD-10-CM | POA: Diagnosis not present

## 2023-02-05 DIAGNOSIS — Z9981 Dependence on supplemental oxygen: Secondary | ICD-10-CM | POA: Diagnosis not present

## 2023-02-05 DIAGNOSIS — J9621 Acute and chronic respiratory failure with hypoxia: Secondary | ICD-10-CM | POA: Diagnosis not present

## 2023-02-10 ENCOUNTER — Other Ambulatory Visit (HOSPITAL_COMMUNITY): Payer: Self-pay | Admitting: Internal Medicine

## 2023-02-10 ENCOUNTER — Encounter: Payer: Self-pay | Admitting: Cardiology

## 2023-02-10 DIAGNOSIS — F411 Generalized anxiety disorder: Secondary | ICD-10-CM | POA: Diagnosis not present

## 2023-02-10 DIAGNOSIS — E870 Hyperosmolality and hypernatremia: Secondary | ICD-10-CM | POA: Diagnosis not present

## 2023-02-10 DIAGNOSIS — J449 Chronic obstructive pulmonary disease, unspecified: Secondary | ICD-10-CM | POA: Diagnosis not present

## 2023-02-10 DIAGNOSIS — J9621 Acute and chronic respiratory failure with hypoxia: Secondary | ICD-10-CM | POA: Diagnosis not present

## 2023-02-10 DIAGNOSIS — R9389 Abnormal findings on diagnostic imaging of other specified body structures: Secondary | ICD-10-CM | POA: Diagnosis not present

## 2023-02-10 DIAGNOSIS — N179 Acute kidney failure, unspecified: Secondary | ICD-10-CM | POA: Diagnosis not present

## 2023-02-10 DIAGNOSIS — I482 Chronic atrial fibrillation, unspecified: Secondary | ICD-10-CM | POA: Diagnosis not present

## 2023-02-10 DIAGNOSIS — D649 Anemia, unspecified: Secondary | ICD-10-CM | POA: Diagnosis not present

## 2023-02-10 DIAGNOSIS — D72829 Elevated white blood cell count, unspecified: Secondary | ICD-10-CM | POA: Diagnosis not present

## 2023-02-10 DIAGNOSIS — I10 Essential (primary) hypertension: Secondary | ICD-10-CM | POA: Diagnosis not present

## 2023-02-10 DIAGNOSIS — E876 Hypokalemia: Secondary | ICD-10-CM | POA: Diagnosis not present

## 2023-02-11 ENCOUNTER — Other Ambulatory Visit: Payer: Self-pay

## 2023-02-14 ENCOUNTER — Encounter (HOSPITAL_COMMUNITY): Payer: Self-pay

## 2023-02-14 ENCOUNTER — Ambulatory Visit (HOSPITAL_COMMUNITY): Payer: Medicare Other

## 2023-02-21 ENCOUNTER — Other Ambulatory Visit: Payer: Self-pay

## 2023-04-14 ENCOUNTER — Other Ambulatory Visit (HOSPITAL_COMMUNITY): Payer: Self-pay | Admitting: Internal Medicine

## 2023-04-14 DIAGNOSIS — J9621 Acute and chronic respiratory failure with hypoxia: Secondary | ICD-10-CM | POA: Diagnosis not present

## 2023-04-14 DIAGNOSIS — R9389 Abnormal findings on diagnostic imaging of other specified body structures: Secondary | ICD-10-CM | POA: Diagnosis not present

## 2023-04-14 DIAGNOSIS — J069 Acute upper respiratory infection, unspecified: Secondary | ICD-10-CM | POA: Diagnosis not present

## 2023-04-14 DIAGNOSIS — J449 Chronic obstructive pulmonary disease, unspecified: Secondary | ICD-10-CM | POA: Diagnosis not present

## 2023-04-19 ENCOUNTER — Ambulatory Visit (HOSPITAL_COMMUNITY)
Admission: RE | Admit: 2023-04-19 | Discharge: 2023-04-19 | Disposition: A | Discharge status: Home / Self Care | Source: Ambulatory Visit | Attending: Internal Medicine | Admitting: Internal Medicine

## 2023-04-19 DIAGNOSIS — J439 Emphysema, unspecified: Secondary | ICD-10-CM | POA: Diagnosis not present

## 2023-04-19 DIAGNOSIS — R9389 Abnormal findings on diagnostic imaging of other specified body structures: Secondary | ICD-10-CM | POA: Insufficient documentation

## 2023-04-23 ENCOUNTER — Other Ambulatory Visit: Payer: Self-pay

## 2023-05-20 ENCOUNTER — Encounter (INDEPENDENT_AMBULATORY_CARE_PROVIDER_SITE_OTHER): Payer: Self-pay | Admitting: *Deleted

## 2023-05-26 ENCOUNTER — Encounter: Payer: Self-pay | Admitting: Hematology

## 2023-05-26 ENCOUNTER — Encounter (INDEPENDENT_AMBULATORY_CARE_PROVIDER_SITE_OTHER): Payer: Self-pay | Admitting: Gastroenterology

## 2023-05-26 ENCOUNTER — Ambulatory Visit (INDEPENDENT_AMBULATORY_CARE_PROVIDER_SITE_OTHER): Admitting: Gastroenterology

## 2023-05-26 ENCOUNTER — Encounter (HOSPITAL_COMMUNITY): Payer: Self-pay | Admitting: Hematology

## 2023-05-26 VITALS — BP 125/72 | HR 65 | Temp 97.3°F | Ht 60.0 in | Wt 159.6 lb

## 2023-05-26 DIAGNOSIS — Z8711 Personal history of peptic ulcer disease: Secondary | ICD-10-CM | POA: Diagnosis not present

## 2023-05-26 DIAGNOSIS — K7460 Unspecified cirrhosis of liver: Secondary | ICD-10-CM | POA: Insufficient documentation

## 2023-05-26 DIAGNOSIS — Z6831 Body mass index (BMI) 31.0-31.9, adult: Secondary | ICD-10-CM | POA: Insufficient documentation

## 2023-05-26 DIAGNOSIS — R932 Abnormal findings on diagnostic imaging of liver and biliary tract: Secondary | ICD-10-CM

## 2023-05-26 DIAGNOSIS — K279 Peptic ulcer, site unspecified, unspecified as acute or chronic, without hemorrhage or perforation: Secondary | ICD-10-CM

## 2023-05-26 NOTE — Patient Instructions (Signed)
 It was very nice to meet you today, as dicussed with will plan for the following :  1) Labwork and ultrasound

## 2023-05-26 NOTE — Progress Notes (Signed)
 Marissa Turner , M.D. Gastroenterology & Hepatology [facility] [address] [place], [place] [address] Primary Care Physician: Omie Bickers, MD [address] [place] [address]  Chief Complaint: Imaging finding of cirrhosis  History of Present Illness: Marissa Turner is a 83 y.o. female with A-fib on anticoagulation, COPD, status post Billroth II for peptic ulcer disease who presents for evaluation of imaging finding of cirrhosis  Patient denies alcohol use, no herbal medications.  Denies any history of liver disease or family history of liver disease.  Denies any generalized yellowing of or itching Patient CODE STATUS is DNR/DNI The patient denies having any nausea, vomiting, fever, chills, hematochezia, melena, hematemesis, abdominal distention, abdominal pain, diarrhea, jaundice, pruritus or weight loss.  Last EGD: Many years ago Last Colonoscopy: Over 10 years ago  FHx: neg for any gastrointestinal/liver disease, no malignancies Social: neg smoking, alcohol or illicit drug use Surgical: Hysterectomy appendectomy cholecystectomy, Billroth II 1984  Lab from 01/2023 normal liver enzymes platelet 300    Past Medical History: Past Medical History:  Diagnosis Date   Atrial fibrillation, chronic (HCC) 2004   left atrial thrombus in 2004; near syncope in 2008; echocardiogram in 2008-normal EF, mild left atrial enlargement, no valvular abnormalities, lipomatous hypertrophy; no aortic atheroma on TEE in 2004   Cancer (HCC)    LYMPH NODE    and lung cancer   Chronic anticoagulation 2004   warfarin   COPD (chronic obstructive pulmonary disease) (HCC)    denies   Depression    DJD (degenerative joint disease), lumbosacral    Also hip   Dysrhythmia    chronic AFib   Family history of adverse reaction to anesthesia    sister PONV   Fracture of wrist    Left   History of kidney stones    once   Hypotension    Panic  attacks    Peptic ulcer disease    s/p Billroth II   PONV (postoperative nausea and vomiting)    Tobacco abuse    40 pack years; quit attempt in 2012    Past Surgical History: Past Surgical History:  Procedure Laterality Date   ABDOMINAL HYSTERECTOMY     w/o oophorectomy  partial   APPENDECTOMY     CESAREAN SECTION     CHOLECYSTECTOMY  2004   [facility]   COLONOSCOPY  1980s   patient denies ever having undergone colonoscopy as of 2014   GASTROJEJUNOSTOMY  1984   Billroth II; splenectomy; incidental appendectomy   KNEE ARTHROSCOPY WITH MEDIAL MENISECTOMY Right 02/26/2015   Procedure: KNEE ARTHROSCOPY WITH MEDIAL MENISECTOMY;  Surgeon: Pleasant Brilliant, MD;  Location: [facility];  Service: Orthopedics;  Laterality: Right;   LYMPH NODE BIOPSY Left 09/15/2013   Procedure: LEFT NECK CERVICAL NODE BIOPSY;  Surgeon: Myrl Askew, MD;  Location: [facility];  Service: General;  Laterality: Left;   PORT-A-CATH REMOVAL Right 07/12/2014   Procedure: REMOVAL PORT-A-CATH;  Surgeon: Alanda Allegra Md, MD;  Location: [facility];  Service: General;  Laterality: Right;   PORTACATH PLACEMENT Right 10/21/2013   Procedure: ATTEMPTED INSERTION PORT-A-CATH;  Surgeon: Myrl Askew, MD;  Location: [facility];  Service: General;  Laterality: Right;   SPLENECTOMY, TOTAL     THORACOSCOPY Left 04/15/2018   VIDEO ASSISTED THORACOSCOPY (VATS)/LEFT LOWER LOBECTOMY (Left)   TONSILLECTOMY     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   TOTAL HIP ARTHROPLASTY Left 10/18/2018   Procedure: TOTAL HIP ARTHROPLASTY  ANTERIOR APPROACH;  Surgeon: Liliane Rei, MD;  Location: [facility];  Service: Orthopedics;  Laterality: Left;    TOTAL KNEE ARTHROPLASTY Right 07/25/2019   Procedure: TOTAL KNEE ARTHROPLASTY;  Surgeon: Liliane Rei, MD;  Location: [facility];  Service: Orthopedics;  Laterality: Right;    VIDEO ASSISTED THORACOSCOPY (VATS)/ LOBECTOMY Left 04/15/2018   Procedure: VIDEO ASSISTED THORACOSCOPY (VATS)/LEFT LOWER LOBECTOMY;   Surgeon: Zelphia Higashi, MD;  Location[facility] OR;  Service: Thoracic;  Laterality: Left;   WEDGE RESECTION Left 04/15/2018   Procedure: LEFT UPPER WEDGE RESECTION;  Surgeon: Zelphia Higashi, MD;  Location: [facility] OR;  Service: Thoracic;  Laterality: Left;    Family History: Family History  Problem Relation Age of Onset   Heart attack Mother        also brother   Breast cancer Sister    Heart attack Brother    Breast cancer Sister        x2   Cancer Brother        Gastric carcinoma; also father   Cirrhosis Sister     Social History: Social History   Tobacco Use  Smoking Status Former   Current packs/day: 0.50   Average packs/day: 0.5 packs/day for 55.0 years (27.5 ttl pk-yrs)   Types: Cigarettes  Smokeless Tobacco Never  Tobacco Comments   quit quit in june   Social History   Substance and Sexual Activity  Alcohol Use No   Social History   Substance and Sexual Activity  Drug Use No    Allergies: Allergies  Allergen Reactions   Estrogens Swelling    edema   Codeine Swelling and Rash   Oxytetracycline Other (See Comments)   Penicillins Other (See Comments)    Break out in welts. Did it involve swelling of the face/tongue/throat, SOB, or low BP? No Did it involve sudden or severe rash/hives, skin peeling, or any reaction on the inside of your mouth or nose? No Did you need to seek medical attention at a hospital or doctor's office? No When did it last happen?  Childhood allergy      If all above answers are "NO", may proceed with cephalosporin use.  Tolerated Cephalosporin Date: 07/26/19.     Diphenhydramine  Hcl Other (See Comments)    hallucinations     Medications: Current Outpatient Medications  Medication Sig Dispense Refill   acetaminophen  (TYLENOL ) 650 MG CR tablet Take 1,300 mg by mouth every evening. As needed     albuterol  (VENTOLIN  HFA) 108 (90 Base) MCG/ACT inhaler Inhale 1-2 puffs into the lungs every 4 (four) hours as needed for  wheezing or shortness of breath.     ALPRAZolam  (XANAX ) 1 MG tablet Take 1 tablet (1 mg total) by mouth 3 (three) times daily as needed for anxiety. (Patient taking differently: Take 1 mg by mouth at bedtime.) 30 tablet 0   apixaban  (ELIQUIS ) 5 MG TABS tablet TAKE ONE TABLET (5MG  TOTAL) BY MOUTH TWOTIMES DAILY 180 tablet 3   citalopram  (CELEXA ) 20 MG tablet Take 20 mg by mouth daily.     diltiazem  (CARDIZEM  CD) 240 MG 24 hr capsule Take 1 capsule (240 mg total) by mouth 2 (two) times daily. 60 capsule 3   Multiple Vitamin (MULTIVITAMIN) capsule Take 1 capsule by mouth daily.     OXYGEN  Inhale into the lungs. 3 - 3.5 liters     pantoprazole  (PROTONIX ) 40 MG tablet Take 1 tablet (40 mg total) by mouth daily. 90 tablet 0   No  current facility-administered medications for this visit.    Review of Systems: GENERAL: negative for malaise, night sweats HEENT: No changes in hearing or vision, no nose bleeds or other nasal problems. NECK: Negative for lumps, goiter, pain and significant neck swelling RESPIRATORY: Negative for cough, wheezing CARDIOVASCULAR: Negative for chest pain, leg swelling, palpitations, orthopnea GI: SEE HPI MUSCULOSKELETAL: Negative for joint pain or swelling, back pain, and muscle pain. SKIN: Negative for lesions, rash HEMATOLOGY Negative for prolonged bleeding, bruising easily, and swollen nodes. ENDOCRINE: Negative for cold or heat intolerance, polyuria, polydipsia and goiter. NEURO: negative for tremor, gait imbalance, syncope and seizures. The remainder of the review of systems is noncontributory.   Physical Exam: BP 125/72   Pulse 65   Temp (!) 97.3 F (36.3 C) (Oral)   Ht 5' (1.524 m)   Wt 159 lb 9.6 oz (72.4 kg)   BMI 31.17 kg/m  GENERAL: The patient is AO x3, in no acute distress. HEENT: Head is normocephalic and atraumatic. EOMI are intact. Mouth is well hydrated and without lesions. NECK: Supple. No masses LUNGS: Clear to auscultation. No presence of  rhonchi/wheezing/rales. Adequate chest expansion HEART: RRR, normal s1 and s2. ABDOMEN: Soft, nontender, no guarding, no peritoneal signs, and nondistended. BS +. No masses.  Imaging/Labs:  CT CHEST 03/2023  IMPRESSION: 1. New 8 mm nodule in the posteromedial extreme base of the right lower lobe. Due to the patient's prior cancer history, a three-month follow-up study is recommended. 2. Emphysema. 3. Central bronchial thickening with scattered subsegmental bronchial plugging in the left lower lung field. 4. Chronically stable 8 mm nodule in the anterior segment of the right upper lobe, consistent with a benign entity. 5. Decreased size of mediastinal lymph nodes. 6. Aortic and coronary artery atherosclerosis. 7. Stable enlargement of the pulmonary trunk indicating arterial hypertension. 8. Suspected cirrhosis. 9. Osteopenia and degenerative change.     Latest Ref Rng & Units 07/02/2022    4:15 AM 07/01/2022    4:27 AM 06/30/2022    4:15 AM  CBC  WBC 4.0 - 10.5 K/uL 13.3  14.8  13.7   Hemoglobin 12.0 - 15.0 g/dL 13.0  86.5  9.7   Hematocrit 36.0 - 46.0 % 34.2  32.8  30.4   Platelets 150 - 400 K/uL 381  303  251    Lab Results  Component Value Date   IRON 41 08/16/2019   TIBC 394 08/16/2019   FERRITIN 189 08/16/2019    I personally reviewed and interpreted the available labs, imaging and endoscopic files.  Impression and Plan: History of Present Illness: Marissa Turner is a 83 y.o. female with A-fib on anticoagulation, COPD, status post Billroth II for peptic ulcer disease who presents for evaluation of imaging finding of cirrhosis  #Imaging finding of cirrhosis    On exam patient does not have signs of advanced chronic liver disease, no splenomegaly, ascites, spider angiomas, palmar eythema    From recent labs normal liver enzymes and platelet more than 150 suggesting against any clinically significant portal hypertension  Patient with advanced age and with multiple  significant comorbidities; doubt she is a candidate to be placed on HCC screening protocol  Although we discussed obtain baseline abdominal ultrasound to evaluate for signs of portal hypertension  Will obtain baseline viral hepatitis profile  #HCM Patient has history of peptic ulcer disease with Billroth II gastro jejunostomy I do not have prior records of her colonoscopy  Patient and family in the clinic would like to  defer any screening colonoscopy at this time after risk-benefit discussion given patient advanced age and medical comorbidities  All questions were answered.      Marissa Ransom Faizan Archimedes Harold, MD Gastroenterology and Hepatology [facility]   This chart has been completed using [facility] Dictation software, and while attempts have been made to ensure accuracy , certain words and phrases may not be transcribed as intended

## 2023-06-02 ENCOUNTER — Ambulatory Visit (HOSPITAL_COMMUNITY)
Admission: RE | Admit: 2023-06-02 | Discharge: 2023-06-02 | Disposition: A | Discharge status: Home / Self Care | Source: Ambulatory Visit | Attending: Gastroenterology | Admitting: Gastroenterology

## 2023-06-02 DIAGNOSIS — N281 Cyst of kidney, acquired: Secondary | ICD-10-CM | POA: Diagnosis not present

## 2023-06-02 DIAGNOSIS — K7460 Unspecified cirrhosis of liver: Secondary | ICD-10-CM | POA: Diagnosis not present

## 2023-06-03 ENCOUNTER — Encounter: Payer: Self-pay | Admitting: Hematology

## 2023-06-08 ENCOUNTER — Encounter: Payer: Self-pay | Admitting: Hematology

## 2023-06-08 LAB — ANA

## 2023-06-09 ENCOUNTER — Encounter: Payer: Self-pay | Admitting: Hematology

## 2023-06-09 LAB — HEPATITIS A ANTIBODY, TOTAL: hep A Total Ab: NEGATIVE

## 2023-06-09 LAB — HEPATITIS B SURFACE ANTIBODY,QUALITATIVE: Hep B Surface Ab, Qual: NONREACTIVE

## 2023-06-09 LAB — COMPREHENSIVE METABOLIC PANEL WITH GFR
ALT: 33 IU/L — ABNORMAL HIGH (ref 0–32)
AST: 48 IU/L — ABNORMAL HIGH (ref 0–40)
Albumin: 4.1 g/dL (ref 3.7–4.7)
Alkaline Phosphatase: 76 IU/L (ref 44–121)
BUN/Creatinine Ratio: 17 (ref 12–28)
BUN: 12 mg/dL (ref 8–27)
Bilirubin Total: 0.2 mg/dL (ref 0.0–1.2)
CO2: 30 mmol/L — ABNORMAL HIGH (ref 20–29)
Calcium: 9.2 mg/dL (ref 8.7–10.3)
Chloride: 96 mmol/L (ref 96–106)
Creatinine, Ser: 0.7 mg/dL (ref 0.57–1.00)
Globulin, Total: 2.4 g/dL (ref 1.5–4.5)
Glucose: 84 mg/dL (ref 70–99)
Potassium: 4.7 mmol/L (ref 3.5–5.2)
Sodium: 142 mmol/L (ref 134–144)
Total Protein: 6.5 g/dL (ref 6.0–8.5)
eGFR: 86 mL/min/{1.73_m2} (ref >59)

## 2023-06-09 LAB — ENA+DNA/DS+ANTICH+CENTRO+FA...
Anti JO-1: <0.2 AI (ref 0.0–0.9)
Centromere Ab Screen: <0.2 AI (ref 0.0–0.9)
Chromatin Ab SerPl-aCnc: <0.2 AI (ref 0.0–0.9)
ENA RNP Ab: <0.2 AI (ref 0.0–0.9)
ENA SM Ab Ser-aCnc: <0.2 AI (ref 0.0–0.9)
ENA SSA (RO) Ab: <0.2 AI (ref 0.0–0.9)
ENA SSB (LA) Ab: <0.2 AI (ref 0.0–0.9)
Scleroderma (Scl-70) (ENA) Antibody, IgG: <0.2 AI (ref 0.0–0.9)
Speckled Pattern: 1:80 {titer}
dsDNA Ab: <1 [IU]/mL (ref 0–9)

## 2023-06-09 LAB — ANA

## 2023-06-09 LAB — HEPATITIS C ANTIBODY: Hep C Virus Ab: NONREACTIVE

## 2023-06-09 LAB — PROTIME-INR
INR: 1 (ref 0.9–1.2)
Prothrombin Time: 11.3 s (ref 9.1–12.0)

## 2023-06-09 LAB — HEPATITIS B SURFACE ANTIGEN: Hepatitis B Surface Ag: NEGATIVE

## 2023-06-09 LAB — HEPATITIS B CORE ANTIBODY, TOTAL: Hep B Core Total Ab: NEGATIVE

## 2023-06-15 ENCOUNTER — Ambulatory Visit (INDEPENDENT_AMBULATORY_CARE_PROVIDER_SITE_OTHER): Payer: Self-pay | Admitting: Gastroenterology

## 2023-08-04 ENCOUNTER — Encounter: Payer: Self-pay | Admitting: Internal Medicine

## 2023-08-04 ENCOUNTER — Ambulatory Visit: Attending: Internal Medicine | Admitting: Internal Medicine

## 2023-08-04 VITALS — BP 116/70 | HR 106 | Ht 61.0 in | Wt 166.0 lb

## 2023-08-04 DIAGNOSIS — I4821 Permanent atrial fibrillation: Secondary | ICD-10-CM | POA: Insufficient documentation

## 2023-08-04 DIAGNOSIS — I482 Chronic atrial fibrillation, unspecified: Secondary | ICD-10-CM | POA: Diagnosis not present

## 2023-08-04 DIAGNOSIS — D649 Anemia, unspecified: Secondary | ICD-10-CM | POA: Diagnosis not present

## 2023-08-04 DIAGNOSIS — R7303 Prediabetes: Secondary | ICD-10-CM | POA: Diagnosis not present

## 2023-08-04 DIAGNOSIS — E559 Vitamin D deficiency, unspecified: Secondary | ICD-10-CM | POA: Diagnosis not present

## 2023-08-04 DIAGNOSIS — I10 Essential (primary) hypertension: Secondary | ICD-10-CM | POA: Diagnosis not present

## 2023-08-04 DIAGNOSIS — R252 Cramp and spasm: Secondary | ICD-10-CM | POA: Diagnosis not present

## 2023-08-04 NOTE — Patient Instructions (Addendum)
 Medication Instructions:  Your physician recommends that you continue on your current medications as directed. Please refer to the Current Medication list given to you today.  Labwork: none  Testing/Procedures: none  Follow-Up: Your physician recommends that you schedule a follow-up appointment in: 1.5-2 years. You will receive a reminder call in about 10 months reminding you to schedule your appointment. If you don't receive this call, please contact our office.  Any Other Special Instructions Will Be Listed Below (If Applicable).  If you need a refill on your cardiac medications before your next appointment, please call your pharmacy.

## 2023-08-05 DIAGNOSIS — I4821 Permanent atrial fibrillation: Secondary | ICD-10-CM | POA: Insufficient documentation

## 2023-08-05 NOTE — Progress Notes (Signed)
 Cardiology Office Note  Date: 08/05/2023   ID: Marissa Turner, Marissa Turner 04-Feb-1940, MRN [id number]  PCP:  Marissa Norleen PEDLAR, MD  Cardiologist:  Diannah SHAUNNA Maywood, MD Electrophysiologist:  None   History of Present Illness: Marissa Turner is a 83 y.o. 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  Patient denied any rest or exertional chest discomfort, tightness, heaviness or pressure, rest or exertional dyspnea, palpitations, light-headedness, syncope and LE swelling. Compliant with medications and no side-effects. No bleeding complications.  Can perform her household chores, has no symptoms.   Past Medical History:  Diagnosis Date   Atrial fibrillation, chronic (HCC) 2004   left atrial thrombus in 2004; near syncope in 2008; echocardiogram in 2008-normal EF, mild left atrial enlargement, no valvular abnormalities, lipomatous hypertrophy; no aortic atheroma on TEE in 2004   Cancer (HCC)    LYMPH NODE    and lung cancer   Chronic anticoagulation 2004   warfarin   COPD (chronic obstructive pulmonary disease) (HCC)    denies   Depression    DJD (degenerative joint disease), lumbosacral    Also hip   Dysrhythmia    chronic AFib   Family history of adverse reaction to anesthesia    sister PONV   Fracture of wrist    Left   History of kidney stones    once   Hypotension    Panic attacks    Peptic ulcer disease    s/p Billroth II   PONV (postoperative nausea and vomiting)    Tobacco abuse    40 pack years; quit attempt in 2012    Past Surgical History:  Procedure Laterality Date   ABDOMINAL HYSTERECTOMY     w/o oophorectomy  partial   APPENDECTOMY     CESAREAN SECTION     CHOLECYSTECTOMY  2004   [facility]   COLONOSCOPY  1980s   patient denies ever having undergone colonoscopy as of 2014    GASTROJEJUNOSTOMY  1984   Billroth II; splenectomy; incidental appendectomy   KNEE ARTHROSCOPY WITH MEDIAL MENISECTOMY Right 02/26/2015   Procedure: KNEE ARTHROSCOPY WITH MEDIAL MENISECTOMY;  Surgeon: Lemond Stable, MD;  Location: [facility];  Service: Orthopedics;  Laterality: Right;   LYMPH NODE BIOPSY Left 09/15/2013   Procedure: LEFT NECK CERVICAL NODE BIOPSY;  Surgeon: Elsie GORMAN Holland, MD;  Location: [facility];  Service: General;  Laterality: Left;   PORT-A-CATH REMOVAL Right 07/12/2014   Procedure: REMOVAL PORT-A-CATH;  Surgeon: Oneil Budge Md, MD;  Location: [facility];  Service: General;  Laterality: Right;   PORTACATH PLACEMENT Right 10/21/2013   Procedure: ATTEMPTED INSERTION PORT-A-CATH;  Surgeon: Elsie GORMAN Holland, MD;  Location: [facility];  Service: General;  Laterality: Right;   SPLENECTOMY, TOTAL     THORACOSCOPY Left 04/15/2018   VIDEO ASSISTED THORACOSCOPY (VATS)/LEFT LOWER LOBECTOMY (Left)   TONSILLECTOMY     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   TOTAL HIP ARTHROPLASTY Left 10/18/2018   Procedure: TOTAL HIP ARTHROPLASTY ANTERIOR APPROACH;  Surgeon: Melodi Lerner, MD;  Location: [facility];  Service: Orthopedics;  Laterality: Left;    TOTAL KNEE ARTHROPLASTY Right 07/25/2019   Procedure: TOTAL KNEE ARTHROPLASTY;  Surgeon: Melodi Lerner, MD;  Location: [facility];  Service: Orthopedics;  Laterality: Right;    VIDEO ASSISTED THORACOSCOPY (VATS)/ LOBECTOMY Left 04/15/2018   Procedure: VIDEO ASSISTED THORACOSCOPY (  VATS)/LEFT LOWER LOBECTOMY;  Surgeon: Kerrin Elspeth BROCKS, MD;  Location: [facility];  Service: Thoracic;  Laterality: Left;   WEDGE RESECTION Left 04/15/2018   Procedure: LEFT UPPER WEDGE RESECTION;  Surgeon: Kerrin Elspeth BROCKS, MD;  Location: [facility];  Service: Thoracic;  Laterality: Left;    Current Outpatient Medications  Medication Sig Dispense Refill   acetaminophen  (TYLENOL ) 650 MG CR tablet Take 1,300 mg by mouth every evening. As needed     albuterol  (VENTOLIN  HFA) 108 (90  Base) MCG/ACT inhaler Inhale 1-2 puffs into the lungs every 4 (four) hours as needed for wheezing or shortness of breath.     ALPRAZolam  (XANAX ) 1 MG tablet Take 1 tablet (1 mg total) by mouth 3 (three) times daily as needed for anxiety. (Patient taking differently: Take 1 mg by mouth at bedtime.) 30 tablet 0   apixaban  (ELIQUIS ) 5 MG TABS tablet TAKE ONE TABLET (5MG  TOTAL) BY MOUTH TWOTIMES DAILY 180 tablet 3   citalopram  (CELEXA ) 20 MG tablet Take 20 mg by mouth daily.     diltiazem  (CARDIZEM  CD) 240 MG 24 hr capsule Take 1 capsule (240 mg total) by mouth 2 (two) times daily. 60 capsule 3   Multiple Vitamin (MULTIVITAMIN) capsule Take 1 capsule by mouth daily.     OXYGEN  Inhale into the lungs. 3 - 3.5 liters     pantoprazole  (PROTONIX ) 40 MG tablet Take 1 tablet (40 mg total) by mouth daily. 90 tablet 0   No current facility-administered medications for this visit.   Allergies:  Estrogens, Codeine, Oxytetracycline, Penicillins, and Diphenhydramine  hcl   Social History: The patient  reports that she has quit smoking. Her smoking use included cigarettes. She has a 27.5 pack-year smoking history. She has never used smokeless tobacco. She reports that she does not drink alcohol and does not use drugs.   Family History: The patient's family history includes Breast cancer in her sister and sister; Cancer in her brother; Cirrhosis in her sister; Heart attack in her brother and mother.   ROS:  Please see the history of present illness. Otherwise, complete review of systems is positive for none.  All other systems are reviewed and negative.   Physical Exam: VS:  BP 116/70 (BP Location: Left Arm, Patient Position: Sitting, Cuff Size: Normal)   Pulse (!) 106   Ht 5' 1 (1.549 m)   Wt 166 lb (75.3 kg)   SpO2 91%   BMI 31.37 kg/m , BMI Body mass index is 31.37 kg/m.  Wt Readings from Last 3 Encounters:  08/04/23 166 lb (75.3 kg)  05/26/23 159 lb 9.6 oz (72.4 kg)  07/15/22 150 lb (68 kg)     General: Patient appears comfortable at rest. HEENT: Conjunctiva and lids normal, oropharynx clear with moist mucosa. Neck: Supple, no elevated JVP or carotid bruits, no thyromegaly. Lungs: Clear to auscultation, nonlabored breathing at rest. Cardiac: Regular rate and rhythm, no S3 or significant systolic murmur, no pericardial rub. Abdomen: Soft, nontender, no hepatomegaly, bowel sounds present, no guarding or rebound. Extremities: No pitting edema, distal pulses 2+. Skin: Warm and dry. Musculoskeletal: No kyphosis. Neuropsychiatric: Alert and oriented x3, affect grossly appropriate.  Recent Labwork: 06/02/2023: ALT 33; AST 48; BUN 12; Creatinine, Ser 0.70; Potassium 4.7; Sodium 142     Component Value Date/Time   CHOL 179 06/30/2013 0840   TRIG 173 (H) 06/30/2013 0840   HDL 37 (L) 06/30/2013 0840   CHOLHDL 4.8 06/30/2013 0840   VLDL 35 06/30/2013 0840   LDLCALC  107 (H) 06/30/2013 0840     Assessment and Plan:  Permanent A-fib: Asymptomatic.  EKG today showed A-fib with RVR, HR 109 bpm.  Home HR ranges around 70 to 80 bpm.  It does not go beyond 90s bpm echo due to the patient.  She reported that hospital/doctors offices make her feel nervous and hence her heart rates are high.  Monitor HR at home.  If stays more than 100 consistently, she will need to reach out to us  for optimization of rate controlling medications.  Otherwise, continue diltiazem  240 mg twice daily, Eliquis  5 mg twice daily (serum creatinine normal, weight 75 kg and age 67 years).  HTN, controlled: Continue above medications.    Medication Adjustments/Labs and Tests Ordered: Current medicines are reviewed at length with the patient today.  Concerns regarding medicines are outlined above.    Disposition:  Follow up 1.5 to 2 year with APP  Signed, Elmire Amrein Arleta Maywood, MD, 08/05/2023 3:52 PM    [facility] [address], [place], [place] [address]

## 2023-08-10 DIAGNOSIS — J449 Chronic obstructive pulmonary disease, unspecified: Secondary | ICD-10-CM | POA: Diagnosis not present

## 2023-08-10 DIAGNOSIS — N179 Acute kidney failure, unspecified: Secondary | ICD-10-CM | POA: Diagnosis not present

## 2023-08-10 DIAGNOSIS — R911 Solitary pulmonary nodule: Secondary | ICD-10-CM | POA: Diagnosis not present

## 2023-08-10 DIAGNOSIS — K760 Fatty (change of) liver, not elsewhere classified: Secondary | ICD-10-CM | POA: Diagnosis not present

## 2023-08-10 DIAGNOSIS — F411 Generalized anxiety disorder: Secondary | ICD-10-CM | POA: Diagnosis not present

## 2023-08-10 DIAGNOSIS — J9611 Chronic respiratory failure with hypoxia: Secondary | ICD-10-CM | POA: Diagnosis not present

## 2023-08-10 DIAGNOSIS — I482 Chronic atrial fibrillation, unspecified: Secondary | ICD-10-CM | POA: Diagnosis not present

## 2023-08-10 DIAGNOSIS — D72829 Elevated white blood cell count, unspecified: Secondary | ICD-10-CM | POA: Diagnosis not present

## 2023-08-10 DIAGNOSIS — D649 Anemia, unspecified: Secondary | ICD-10-CM | POA: Diagnosis not present

## 2023-08-10 DIAGNOSIS — R7303 Prediabetes: Secondary | ICD-10-CM | POA: Diagnosis not present

## 2023-08-10 DIAGNOSIS — I10 Essential (primary) hypertension: Secondary | ICD-10-CM | POA: Diagnosis not present

## 2023-08-10 DIAGNOSIS — D72820 Lymphocytosis (symptomatic): Secondary | ICD-10-CM | POA: Diagnosis not present

## 2023-08-11 ENCOUNTER — Ambulatory Visit (INDEPENDENT_AMBULATORY_CARE_PROVIDER_SITE_OTHER): Admitting: Gastroenterology

## 2023-08-11 ENCOUNTER — Other Ambulatory Visit (HOSPITAL_COMMUNITY): Payer: Self-pay | Admitting: Internal Medicine

## 2023-08-11 DIAGNOSIS — R911 Solitary pulmonary nodule: Secondary | ICD-10-CM

## 2023-08-12 ENCOUNTER — Other Ambulatory Visit: Payer: Self-pay

## 2023-08-14 ENCOUNTER — Ambulatory Visit: Admitting: Internal Medicine

## 2023-08-23 ENCOUNTER — Ambulatory Visit (HOSPITAL_COMMUNITY)
Admission: RE | Admit: 2023-08-23 | Discharge: 2023-08-23 | Disposition: A | Discharge status: Home / Self Care | Source: Ambulatory Visit | Attending: Internal Medicine | Admitting: Internal Medicine

## 2023-08-23 DIAGNOSIS — R918 Other nonspecific abnormal finding of lung field: Secondary | ICD-10-CM | POA: Diagnosis not present

## 2023-08-23 DIAGNOSIS — I3139 Other pericardial effusion (noninflammatory): Secondary | ICD-10-CM | POA: Diagnosis not present

## 2023-08-23 DIAGNOSIS — R911 Solitary pulmonary nodule: Secondary | ICD-10-CM | POA: Insufficient documentation

## 2023-08-23 DIAGNOSIS — J90 Pleural effusion, not elsewhere classified: Secondary | ICD-10-CM | POA: Diagnosis not present

## 2023-08-29 DIAGNOSIS — J440 Chronic obstructive pulmonary disease with acute lower respiratory infection: Secondary | ICD-10-CM | POA: Diagnosis not present

## 2023-08-29 DIAGNOSIS — R062 Wheezing: Secondary | ICD-10-CM | POA: Diagnosis not present

## 2023-08-29 DIAGNOSIS — I509 Heart failure, unspecified: Secondary | ICD-10-CM | POA: Diagnosis not present

## 2023-08-31 DIAGNOSIS — J449 Chronic obstructive pulmonary disease, unspecified: Secondary | ICD-10-CM | POA: Diagnosis not present

## 2023-08-31 DIAGNOSIS — K760 Fatty (change of) liver, not elsewhere classified: Secondary | ICD-10-CM | POA: Diagnosis not present

## 2023-08-31 DIAGNOSIS — W19XXXA Unspecified fall, initial encounter: Secondary | ICD-10-CM | POA: Diagnosis not present

## 2023-08-31 DIAGNOSIS — J441 Chronic obstructive pulmonary disease with (acute) exacerbation: Secondary | ICD-10-CM | POA: Diagnosis not present

## 2023-08-31 DIAGNOSIS — J9611 Chronic respiratory failure with hypoxia: Secondary | ICD-10-CM | POA: Diagnosis not present

## 2023-08-31 DIAGNOSIS — I482 Chronic atrial fibrillation, unspecified: Secondary | ICD-10-CM | POA: Diagnosis not present

## 2023-08-31 DIAGNOSIS — I10 Essential (primary) hypertension: Secondary | ICD-10-CM | POA: Diagnosis not present

## 2023-08-31 DIAGNOSIS — R911 Solitary pulmonary nodule: Secondary | ICD-10-CM | POA: Diagnosis not present

## 2023-08-31 DIAGNOSIS — Z9981 Dependence on supplemental oxygen: Secondary | ICD-10-CM | POA: Diagnosis not present

## 2023-08-31 DIAGNOSIS — F411 Generalized anxiety disorder: Secondary | ICD-10-CM | POA: Diagnosis not present

## 2023-08-31 DIAGNOSIS — E669 Obesity, unspecified: Secondary | ICD-10-CM | POA: Diagnosis not present

## 2023-08-31 DIAGNOSIS — R413 Other amnesia: Secondary | ICD-10-CM | POA: Diagnosis not present

## 2023-09-17 ENCOUNTER — Encounter (INDEPENDENT_AMBULATORY_CARE_PROVIDER_SITE_OTHER): Payer: Self-pay | Admitting: Gastroenterology

## 2023-10-20 DIAGNOSIS — Z23 Encounter for immunization: Secondary | ICD-10-CM | POA: Diagnosis not present

## 2023-12-08 DIAGNOSIS — I10 Essential (primary) hypertension: Secondary | ICD-10-CM | POA: Diagnosis not present

## 2023-12-08 DIAGNOSIS — E559 Vitamin D deficiency, unspecified: Secondary | ICD-10-CM | POA: Diagnosis not present

## 2023-12-08 DIAGNOSIS — R252 Cramp and spasm: Secondary | ICD-10-CM | POA: Diagnosis not present

## 2023-12-08 DIAGNOSIS — R7303 Prediabetes: Secondary | ICD-10-CM | POA: Diagnosis not present

## 2023-12-08 DIAGNOSIS — D649 Anemia, unspecified: Secondary | ICD-10-CM | POA: Diagnosis not present

## 2023-12-14 DIAGNOSIS — R911 Solitary pulmonary nodule: Secondary | ICD-10-CM | POA: Diagnosis not present

## 2023-12-14 DIAGNOSIS — R413 Other amnesia: Secondary | ICD-10-CM | POA: Diagnosis not present

## 2023-12-14 DIAGNOSIS — K760 Fatty (change of) liver, not elsewhere classified: Secondary | ICD-10-CM | POA: Diagnosis not present

## 2023-12-14 DIAGNOSIS — F411 Generalized anxiety disorder: Secondary | ICD-10-CM | POA: Diagnosis not present

## 2023-12-14 DIAGNOSIS — I482 Chronic atrial fibrillation, unspecified: Secondary | ICD-10-CM | POA: Diagnosis not present

## 2023-12-14 DIAGNOSIS — J9611 Chronic respiratory failure with hypoxia: Secondary | ICD-10-CM | POA: Diagnosis not present

## 2023-12-14 DIAGNOSIS — Z0001 Encounter for general adult medical examination with abnormal findings: Secondary | ICD-10-CM | POA: Diagnosis not present

## 2023-12-14 DIAGNOSIS — R195 Other fecal abnormalities: Secondary | ICD-10-CM | POA: Diagnosis not present

## 2023-12-14 DIAGNOSIS — Z0000 Encounter for general adult medical examination without abnormal findings: Secondary | ICD-10-CM | POA: Diagnosis not present

## 2023-12-14 DIAGNOSIS — I10 Essential (primary) hypertension: Secondary | ICD-10-CM | POA: Diagnosis not present

## 2023-12-14 DIAGNOSIS — Z9181 History of falling: Secondary | ICD-10-CM | POA: Diagnosis not present

## 2023-12-14 DIAGNOSIS — J449 Chronic obstructive pulmonary disease, unspecified: Secondary | ICD-10-CM | POA: Diagnosis not present
